# Patient Record
Sex: Male | Born: 1950
Health system: Southern US, Community
[De-identification: ages and names within clinical notes are randomized; demographics above are authoritative.]

## PROBLEM LIST (undated history)

## (undated) DIAGNOSIS — Z973 Presence of spectacles and contact lenses: Secondary | ICD-10-CM

## (undated) DIAGNOSIS — R200 Anesthesia of skin: Secondary | ICD-10-CM

## (undated) DIAGNOSIS — F1120 Opioid dependence, uncomplicated: Secondary | ICD-10-CM

## (undated) DIAGNOSIS — R319 Hematuria, unspecified: Secondary | ICD-10-CM

## (undated) DIAGNOSIS — I739 Peripheral vascular disease, unspecified: Secondary | ICD-10-CM

## (undated) DIAGNOSIS — Z8669 Personal history of other diseases of the nervous system and sense organs: Secondary | ICD-10-CM

## (undated) DIAGNOSIS — K08109 Complete loss of teeth, unspecified cause, unspecified class: Secondary | ICD-10-CM

## (undated) DIAGNOSIS — I714 Abdominal aortic aneurysm, without rupture, unspecified: Secondary | ICD-10-CM

## (undated) DIAGNOSIS — J439 Emphysema, unspecified: Secondary | ICD-10-CM

## (undated) DIAGNOSIS — Z87820 Personal history of traumatic brain injury: Secondary | ICD-10-CM

## (undated) DIAGNOSIS — I482 Chronic atrial fibrillation, unspecified: Secondary | ICD-10-CM

## (undated) DIAGNOSIS — T8859XA Other complications of anesthesia, initial encounter: Secondary | ICD-10-CM

## (undated) DIAGNOSIS — E785 Hyperlipidemia, unspecified: Secondary | ICD-10-CM

## (undated) DIAGNOSIS — R0609 Other forms of dyspnea: Secondary | ICD-10-CM

## (undated) DIAGNOSIS — K219 Gastro-esophageal reflux disease without esophagitis: Secondary | ICD-10-CM

## (undated) DIAGNOSIS — D692 Other nonthrombocytopenic purpura: Secondary | ICD-10-CM

## (undated) DIAGNOSIS — N201 Calculus of ureter: Secondary | ICD-10-CM

## (undated) DIAGNOSIS — M47819 Spondylosis without myelopathy or radiculopathy, site unspecified: Secondary | ICD-10-CM

## (undated) DIAGNOSIS — R0600 Dyspnea, unspecified: Secondary | ICD-10-CM

## (undated) DIAGNOSIS — Z8673 Personal history of transient ischemic attack (TIA), and cerebral infarction without residual deficits: Secondary | ICD-10-CM

## (undated) DIAGNOSIS — I10 Essential (primary) hypertension: Secondary | ICD-10-CM

## (undated) DIAGNOSIS — N401 Enlarged prostate with lower urinary tract symptoms: Secondary | ICD-10-CM

## (undated) DIAGNOSIS — F329 Major depressive disorder, single episode, unspecified: Secondary | ICD-10-CM

## (undated) DIAGNOSIS — R911 Solitary pulmonary nodule: Secondary | ICD-10-CM

## (undated) DIAGNOSIS — Z7901 Long term (current) use of anticoagulants: Secondary | ICD-10-CM

## (undated) DIAGNOSIS — Z972 Presence of dental prosthetic device (complete) (partial): Secondary | ICD-10-CM

## (undated) DIAGNOSIS — T4145XA Adverse effect of unspecified anesthetic, initial encounter: Secondary | ICD-10-CM

## (undated) DIAGNOSIS — R29898 Other symptoms and signs involving the musculoskeletal system: Secondary | ICD-10-CM

## (undated) HISTORY — DX: Opioid dependence, uncomplicated: F11.20

## (undated) HISTORY — DX: Hyperlipidemia, unspecified: E78.5

## (undated) HISTORY — DX: Peripheral vascular disease, unspecified: I73.9

## (undated) HISTORY — PX: OTHER SURGICAL HISTORY: SHX169

## (undated) HISTORY — DX: Solitary pulmonary nodule: R91.1

## (undated) HISTORY — DX: Essential (primary) hypertension: I10

## (undated) HISTORY — DX: Other nonthrombocytopenic purpura: D69.2

---

## 2007-05-25 ENCOUNTER — Inpatient Hospital Stay (HOSPITAL_COMMUNITY): Admission: EM | Admit: 2007-05-25 | Discharge: 2007-05-29 | Payer: Self-pay | Admitting: Emergency Medicine

## 2007-05-25 ENCOUNTER — Ambulatory Visit: Payer: Self-pay | Admitting: Cardiology

## 2007-05-26 ENCOUNTER — Encounter: Payer: Self-pay | Admitting: Cardiology

## 2007-05-26 ENCOUNTER — Encounter (INDEPENDENT_AMBULATORY_CARE_PROVIDER_SITE_OTHER): Payer: Self-pay | Admitting: Neurology

## 2007-05-30 ENCOUNTER — Ambulatory Visit: Payer: Self-pay | Admitting: Cardiology

## 2007-06-02 ENCOUNTER — Ambulatory Visit: Payer: Self-pay

## 2007-06-02 ENCOUNTER — Ambulatory Visit: Payer: Self-pay | Admitting: Cardiology

## 2007-06-09 ENCOUNTER — Ambulatory Visit: Payer: Self-pay | Admitting: Cardiology

## 2007-06-09 LAB — CONVERTED CEMR LAB
INR: 5.3 — ABNORMAL HIGH (ref 0.8–1.0)
Prothrombin Time: 52.1 s — ABNORMAL HIGH (ref 10.9–13.3)

## 2007-06-16 ENCOUNTER — Ambulatory Visit: Payer: Self-pay | Admitting: Cardiology

## 2007-06-21 ENCOUNTER — Ambulatory Visit: Payer: Self-pay | Admitting: Cardiology

## 2007-06-21 LAB — CONVERTED CEMR LAB
ALT: 33 units/L (ref 0–53)
Bilirubin, Direct: 0.2 mg/dL (ref 0.0–0.3)
CO2: 32 meq/L (ref 19–32)
Calcium: 9.4 mg/dL (ref 8.4–10.5)
Cholesterol: 144 mg/dL (ref 0–200)
Creatinine, Ser: 1.1 mg/dL (ref 0.4–1.5)
Glucose, Bld: 94 mg/dL (ref 70–99)
HDL: 25.2 mg/dL — ABNORMAL LOW (ref >39.0)
Sodium: 142 meq/L (ref 135–145)
Total Protein: 7.1 g/dL (ref 6.0–8.3)
Triglycerides: 75 mg/dL (ref 0–149)

## 2007-07-05 ENCOUNTER — Ambulatory Visit: Payer: Self-pay | Admitting: Cardiology

## 2007-07-19 ENCOUNTER — Ambulatory Visit: Payer: Self-pay | Admitting: Cardiology

## 2007-08-09 ENCOUNTER — Ambulatory Visit: Payer: Self-pay | Admitting: Internal Medicine

## 2007-08-18 ENCOUNTER — Ambulatory Visit: Payer: Self-pay | Admitting: Cardiovascular Disease

## 2007-09-15 ENCOUNTER — Ambulatory Visit: Payer: Self-pay | Admitting: Cardiology

## 2007-10-17 ENCOUNTER — Ambulatory Visit: Payer: Self-pay | Admitting: Cardiology

## 2007-11-14 ENCOUNTER — Ambulatory Visit: Payer: Self-pay | Admitting: Cardiovascular Disease

## 2007-11-27 ENCOUNTER — Emergency Department (HOSPITAL_COMMUNITY): Admission: EM | Admit: 2007-11-27 | Discharge: 2007-11-27 | Payer: Self-pay | Admitting: Emergency Medicine

## 2007-12-11 ENCOUNTER — Ambulatory Visit: Payer: Self-pay | Admitting: Cardiovascular Disease

## 2007-12-11 ENCOUNTER — Ambulatory Visit: Payer: Self-pay | Admitting: Cardiology

## 2007-12-18 ENCOUNTER — Ambulatory Visit: Payer: Self-pay | Admitting: Cardiovascular Disease

## 2007-12-25 ENCOUNTER — Ambulatory Visit: Payer: Self-pay | Admitting: Cardiology

## 2008-01-01 ENCOUNTER — Ambulatory Visit: Payer: Self-pay | Admitting: Internal Medicine

## 2008-01-22 ENCOUNTER — Ambulatory Visit: Payer: Self-pay | Admitting: Cardiology

## 2008-01-29 ENCOUNTER — Ambulatory Visit: Payer: Self-pay | Admitting: Cardiology

## 2008-01-29 LAB — CONVERTED CEMR LAB
BUN: 14 mg/dL (ref 6–23)
Creatinine, Ser: 1.1 mg/dL (ref 0.4–1.5)
GFR calc Af Amer: 88 mL/min
GFR calc non Af Amer: 73 mL/min
Potassium: 3.9 meq/L (ref 3.5–5.1)

## 2008-02-07 ENCOUNTER — Ambulatory Visit: Payer: Self-pay | Admitting: Cardiology

## 2008-02-14 ENCOUNTER — Ambulatory Visit: Payer: Self-pay | Admitting: Cardiovascular Disease

## 2008-02-28 ENCOUNTER — Ambulatory Visit: Payer: Self-pay | Admitting: Internal Medicine

## 2008-03-08 ENCOUNTER — Ambulatory Visit: Payer: Self-pay | Admitting: Cardiovascular Disease

## 2008-03-29 ENCOUNTER — Ambulatory Visit: Payer: Self-pay | Admitting: Cardiovascular Disease

## 2008-04-19 ENCOUNTER — Ambulatory Visit: Payer: Self-pay | Admitting: Cardiovascular Disease

## 2008-04-22 ENCOUNTER — Ambulatory Visit: Payer: Self-pay | Admitting: Cardiology

## 2008-04-22 ENCOUNTER — Encounter: Payer: Self-pay | Admitting: Cardiology

## 2008-04-22 DIAGNOSIS — I10 Essential (primary) hypertension: Secondary | ICD-10-CM

## 2008-04-22 DIAGNOSIS — E7800 Pure hypercholesterolemia, unspecified: Secondary | ICD-10-CM

## 2008-04-22 DIAGNOSIS — J449 Chronic obstructive pulmonary disease, unspecified: Secondary | ICD-10-CM

## 2008-04-22 DIAGNOSIS — I4891 Unspecified atrial fibrillation: Secondary | ICD-10-CM

## 2008-04-22 DIAGNOSIS — R0602 Shortness of breath: Secondary | ICD-10-CM

## 2008-04-22 DIAGNOSIS — F17200 Nicotine dependence, unspecified, uncomplicated: Secondary | ICD-10-CM

## 2008-05-03 ENCOUNTER — Ambulatory Visit: Payer: Self-pay | Admitting: Cardiology

## 2008-07-09 ENCOUNTER — Encounter: Payer: Self-pay | Admitting: *Deleted

## 2008-08-14 ENCOUNTER — Encounter: Payer: Self-pay | Admitting: *Deleted

## 2008-08-28 ENCOUNTER — Telehealth: Payer: Self-pay | Admitting: Cardiology

## 2008-09-18 ENCOUNTER — Encounter: Payer: Self-pay | Admitting: Cardiology

## 2008-09-19 ENCOUNTER — Encounter (INDEPENDENT_AMBULATORY_CARE_PROVIDER_SITE_OTHER): Payer: Self-pay | Admitting: Cardiology

## 2008-10-25 ENCOUNTER — Encounter (INDEPENDENT_AMBULATORY_CARE_PROVIDER_SITE_OTHER): Payer: Self-pay | Admitting: *Deleted

## 2008-11-08 ENCOUNTER — Encounter (INDEPENDENT_AMBULATORY_CARE_PROVIDER_SITE_OTHER): Payer: Self-pay | Admitting: Pharmacist

## 2008-12-24 ENCOUNTER — Encounter (INDEPENDENT_AMBULATORY_CARE_PROVIDER_SITE_OTHER): Payer: Self-pay | Admitting: Pharmacist

## 2009-02-28 ENCOUNTER — Encounter: Payer: Self-pay | Admitting: Cardiology

## 2009-03-03 ENCOUNTER — Telehealth (INDEPENDENT_AMBULATORY_CARE_PROVIDER_SITE_OTHER): Payer: Self-pay | Admitting: *Deleted

## 2009-03-10 ENCOUNTER — Telehealth (INDEPENDENT_AMBULATORY_CARE_PROVIDER_SITE_OTHER): Payer: Self-pay | Admitting: *Deleted

## 2009-05-04 ENCOUNTER — Emergency Department (HOSPITAL_COMMUNITY): Admission: EM | Admit: 2009-05-04 | Discharge: 2009-05-04 | Payer: Self-pay | Admitting: Emergency Medicine

## 2009-05-04 ENCOUNTER — Encounter: Payer: Self-pay | Admitting: Cardiology

## 2009-05-28 ENCOUNTER — Telehealth (INDEPENDENT_AMBULATORY_CARE_PROVIDER_SITE_OTHER): Payer: Self-pay | Admitting: *Deleted

## 2009-06-26 ENCOUNTER — Ambulatory Visit: Payer: Self-pay | Admitting: Cardiology

## 2009-06-27 ENCOUNTER — Telehealth (INDEPENDENT_AMBULATORY_CARE_PROVIDER_SITE_OTHER): Payer: Self-pay | Admitting: *Deleted

## 2009-07-01 ENCOUNTER — Ambulatory Visit: Payer: Self-pay | Admitting: Internal Medicine

## 2009-07-02 LAB — CONVERTED CEMR LAB
BUN: 9 mg/dL (ref 6–23)
Calcium: 10.1 mg/dL (ref 8.4–10.5)
Cholesterol: 255 mg/dL — ABNORMAL HIGH (ref 0–200)
Creatinine, Ser: 1.1 mg/dL (ref 0.4–1.5)
Eosinophils Relative: 1.6 % (ref 0.0–5.0)
GFR calc non Af Amer: 74.99 mL/min (ref >60)
Lymphocytes Relative: 41.7 % (ref 12.0–46.0)
Monocytes Relative: 5.9 % (ref 3.0–12.0)
Neutrophils Relative %: 50.3 % (ref 43.0–77.0)
Platelets: 356 10*3/uL (ref 150.0–400.0)
Potassium: 4.4 meq/L (ref 3.5–5.1)
Total CHOL/HDL Ratio: 6
Triglycerides: 202 mg/dL — ABNORMAL HIGH (ref 0.0–149.0)
VLDL: 40.4 mg/dL — ABNORMAL HIGH (ref 0.0–40.0)
WBC: 10.6 10*3/uL — ABNORMAL HIGH (ref 4.5–10.5)

## 2009-07-08 ENCOUNTER — Ambulatory Visit: Payer: Self-pay | Admitting: Internal Medicine

## 2009-07-16 ENCOUNTER — Ambulatory Visit: Payer: Self-pay | Admitting: Cardiology

## 2009-07-22 ENCOUNTER — Ambulatory Visit: Payer: Self-pay | Admitting: Cardiology

## 2009-07-22 ENCOUNTER — Ambulatory Visit: Payer: Self-pay

## 2009-07-22 ENCOUNTER — Encounter: Payer: Self-pay | Admitting: Cardiology

## 2009-07-22 ENCOUNTER — Ambulatory Visit (HOSPITAL_COMMUNITY)
Admission: RE | Admit: 2009-07-22 | Discharge: 2009-07-22 | Payer: Self-pay | Source: Home / Self Care | Admitting: Cardiology

## 2009-07-22 ENCOUNTER — Ambulatory Visit: Payer: Self-pay | Admitting: Cardiovascular Disease

## 2009-07-22 ENCOUNTER — Ambulatory Visit: Payer: Self-pay | Admitting: Internal Medicine

## 2009-07-22 LAB — CONVERTED CEMR LAB
ALT: 19 units/L (ref 0–53)
Alkaline Phosphatase: 69 units/L (ref 39–117)
Bilirubin, Direct: 0.1 mg/dL (ref 0.0–0.3)
Direct LDL: 117.5 mg/dL
HDL: 38.2 mg/dL — ABNORMAL LOW (ref >39.00)
Total Bilirubin: 0.4 mg/dL (ref 0.3–1.2)
Total CHOL/HDL Ratio: 5
Total Protein: 7 g/dL (ref 6.0–8.3)

## 2009-08-01 ENCOUNTER — Telehealth: Payer: Self-pay | Admitting: Cardiology

## 2009-08-12 ENCOUNTER — Ambulatory Visit: Payer: Self-pay | Admitting: Internal Medicine

## 2009-08-12 LAB — CONVERTED CEMR LAB: POC INR: 3.6

## 2009-08-28 ENCOUNTER — Ambulatory Visit: Payer: Self-pay | Admitting: Cardiology

## 2009-09-11 ENCOUNTER — Ambulatory Visit: Payer: Self-pay | Admitting: Cardiovascular Disease

## 2009-09-11 LAB — CONVERTED CEMR LAB: POC INR: 2.7

## 2009-10-02 ENCOUNTER — Ambulatory Visit: Payer: Self-pay | Admitting: Internal Medicine

## 2009-10-02 LAB — CONVERTED CEMR LAB: POC INR: 4

## 2009-10-23 ENCOUNTER — Ambulatory Visit: Payer: Self-pay | Admitting: Cardiology

## 2009-11-14 ENCOUNTER — Ambulatory Visit: Payer: Self-pay | Admitting: Cardiology

## 2009-11-25 ENCOUNTER — Ambulatory Visit: Payer: Self-pay | Admitting: Cardiology

## 2009-11-26 ENCOUNTER — Ambulatory Visit: Payer: Self-pay | Admitting: Internal Medicine

## 2009-11-26 ENCOUNTER — Encounter: Payer: Self-pay | Admitting: Internal Medicine

## 2009-11-26 DIAGNOSIS — M159 Polyosteoarthritis, unspecified: Secondary | ICD-10-CM

## 2009-11-28 LAB — CONVERTED CEMR LAB
Alkaline Phosphatase: 73 units/L (ref 39–117)
BUN: 14 mg/dL (ref 6–23)
Basophils Absolute: 0 10*3/uL (ref 0.0–0.1)
Bilirubin, Direct: 0.1 mg/dL (ref 0.0–0.3)
CO2: 32 meq/L (ref 19–32)
GFR calc non Af Amer: 72.53 mL/min (ref >60)
Glucose, Bld: 94 mg/dL (ref 70–99)
HCT: 42.3 % (ref 39.0–52.0)
HDL: 40.2 mg/dL (ref >39.00)
Hemoglobin: 14.7 g/dL (ref 13.0–17.0)
Lymphocytes Relative: 35.8 % (ref 12.0–46.0)
MCHC: 34.7 g/dL (ref 30.0–36.0)
MCV: 93.8 fL (ref 78.0–100.0)
Monocytes Relative: 5 % (ref 3.0–12.0)
Neutrophils Relative %: 58 % (ref 43.0–77.0)
Platelets: 363 10*3/uL (ref 150.0–400.0)
Potassium: 4.2 meq/L (ref 3.5–5.1)
RBC: 4.52 M/uL (ref 4.22–5.81)
Total Bilirubin: 0.5 mg/dL (ref 0.3–1.2)
Total CHOL/HDL Ratio: 5
Total CK: 164 units/L (ref 7–232)
Total Protein: 7.1 g/dL (ref 6.0–8.3)
VLDL: 21.8 mg/dL (ref 0.0–40.0)
WBC: 9.4 10*3/uL (ref 4.5–10.5)

## 2009-12-09 ENCOUNTER — Ambulatory Visit: Payer: Self-pay | Admitting: Internal Medicine

## 2009-12-09 DIAGNOSIS — M25519 Pain in unspecified shoulder: Secondary | ICD-10-CM

## 2009-12-12 ENCOUNTER — Ambulatory Visit: Payer: Self-pay | Admitting: Cardiology

## 2009-12-12 LAB — CONVERTED CEMR LAB: POC INR: 2.2

## 2009-12-20 ENCOUNTER — Emergency Department (HOSPITAL_COMMUNITY)
Admission: EM | Admit: 2009-12-20 | Discharge: 2009-12-20 | Payer: Self-pay | Source: Home / Self Care | Admitting: Emergency Medicine

## 2009-12-21 ENCOUNTER — Emergency Department (HOSPITAL_COMMUNITY): Admission: EM | Admit: 2009-12-21 | Discharge: 2009-12-21 | Payer: Self-pay | Admitting: Emergency Medicine

## 2009-12-22 ENCOUNTER — Telehealth: Payer: Self-pay | Admitting: Internal Medicine

## 2009-12-25 ENCOUNTER — Telehealth: Payer: Self-pay | Admitting: Internal Medicine

## 2010-01-09 ENCOUNTER — Ambulatory Visit: Payer: Self-pay | Admitting: Cardiovascular Disease

## 2010-01-09 LAB — CONVERTED CEMR LAB: POC INR: 2.3

## 2010-03-01 ENCOUNTER — Encounter: Payer: Self-pay | Admitting: Cardiology

## 2010-03-10 ENCOUNTER — Inpatient Hospital Stay (HOSPITAL_COMMUNITY)
Admission: EM | Admit: 2010-03-10 | Discharge: 2010-03-16 | DRG: 310 | Disposition: A | Discharge status: Home / Self Care | Payer: Self-pay | Attending: Cardiology | Admitting: Cardiology

## 2010-03-10 DIAGNOSIS — Z8673 Personal history of transient ischemic attack (TIA), and cerebral infarction without residual deficits: Secondary | ICD-10-CM

## 2010-03-10 DIAGNOSIS — I10 Essential (primary) hypertension: Secondary | ICD-10-CM | POA: Diagnosis present

## 2010-03-10 DIAGNOSIS — J4489 Other specified chronic obstructive pulmonary disease: Secondary | ICD-10-CM | POA: Diagnosis present

## 2010-03-10 DIAGNOSIS — Z9119 Patient's noncompliance with other medical treatment and regimen: Secondary | ICD-10-CM

## 2010-03-10 DIAGNOSIS — J449 Chronic obstructive pulmonary disease, unspecified: Secondary | ICD-10-CM | POA: Diagnosis present

## 2010-03-10 DIAGNOSIS — Z91199 Patient's noncompliance with other medical treatment and regimen due to unspecified reason: Secondary | ICD-10-CM

## 2010-03-10 DIAGNOSIS — I4891 Unspecified atrial fibrillation: Principal | ICD-10-CM | POA: Diagnosis present

## 2010-03-10 DIAGNOSIS — E785 Hyperlipidemia, unspecified: Secondary | ICD-10-CM | POA: Diagnosis present

## 2010-03-10 NOTE — Assessment & Plan Note (Signed)
Summary: bilateral shoulder pain/cd   Vital Signs:  Patient profile:   60 year old male Height:      71 inches Weight:      203 pounds O2 Sat:      97 % on Room air Temp:     98.1 degrees F oral Pulse rate:   76 / minute Pulse rhythm:   irregular Resp:     16 per minute BP sitting:   110 / 70  (left arm)  O2 Flow:  Room air  Primary Care Provider:  Etta Grandchild MD   History of Present Illness: He returns c/o shoulder pain after he was last here and received vaccines in his shoulders: right- Tdap and left-Flu. The right shoulder has hurt the most and still hurts but the left shoulder pain has resolved. He did not see any redness or swelling at the injection sight. Tramadol controls the pain. He has had pains like this before and he does not think it is caused by his statin therapy and he recently had a normal CPK level done. He thinks it is DJD pain like that he has in his left ankle.  Hypertension History:      He denies headache, chest pain, palpitations, dyspnea with exertion, orthopnea, PND, peripheral edema, visual symptoms, neurologic problems, syncope, and side effects from treatment.        Positive major cardiovascular risk factors include male age 61 years old or older, hyperlipidemia, hypertension, and current tobacco user.  Negative major cardiovascular risk factors include no history of diabetes.        Positive history for target organ damage include cardiac end organ damage (either CHF or LVH) and prior stroke (or TIA).  Further assessment for target organ damage reveals no history of ASHD, peripheral vascular disease, renal insufficiency, or hypertensive retinopathy.     Allergies: 1)  ! Aspirin  Past History:  Past Medical History: Last updated: 11/14/2009 Atrial fibrillation hypertension. Hyperlipidemia  History of amaurosis fugax in the left eye   Past Surgical History: Last updated: 11/26/2009 Denies surgical history  Family History: Last updated:  04/18/2008 Mother is alive at age 51 with hypertension.  Father   died of 3 with gastric cancer.  He has a sister who is alive and well.      Social History: Last updated: 11/26/2009 He lives in [place] with his mother.  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  Alcohol Use - no Drug use-no Regular exercise-no  Risk Factors: Alcohol Use: 0 (11/26/2009) Exercise: no (11/26/2009)  Family History: Reviewed history from 04/18/2008 and no changes required. Mother is alive at age 79 with hypertension.  Father   died of 64 with gastric cancer.  He has a sister who is alive and well.      Social History: Reviewed history from 11/26/2009 and no changes required. He lives in [place] with his mother.  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  Alcohol Use - no Drug use-no Regular exercise-no  Review of Systems  The patient denies chest pain, syncope, peripheral edema, prolonged cough, abdominal pain, suspicious skin lesions, and enlarged lymph nodes.   MS:  Complains of joint pain and stiffness; denies joint redness, joint swelling, loss of strength, low back pain, muscle aches, cramps, muscle weakness, and thoracic pain.  Physical Exam  General:  alert, well-developed, well-nourished, well-hydrated, and unkempt.   Neck:  supple, full ROM, and no masses.  Lungs:  normal respiratory effort, no intercostal retractions, no accessory muscle use, normal breath sounds, no dullness, no fremitus, no crackles, and no wheezes.   Heart:  normal rate, regular rhythm, no murmur, no gallop, no rub, and no JVD.   Abdomen:  soft, non-tender, normal bowel sounds, no distention, no masses, no guarding, no rigidity, no rebound tenderness, no abdominal hernia, no inguinal hernia, no hepatomegaly, and no splenomegaly.   Msk:  normal ROM, no joint tenderness, no joint swelling, no joint warmth, no redness over joints, no joint deformities, no joint instability, no  crepitation, and no muscle atrophy.   Pulses:  R and L carotid,radial,femoral,dorsalis pedis and posterior tibial pulses are full and equal bilaterally Extremities:  No clubbing, cyanosis, edema, or deformity noted with normal full range of motion of all joints.   Neurologic:  No cranial nerve deficits noted. Station and gait are normal. Plantar reflexes are down-going bilaterally. DTRs are symmetrical throughout. Sensory, motor and coordinative functions appear intact. Skin:  turgor normal, color normal, no rashes, no suspicious lesions, no ecchymoses, no petechiae, no purpura, no ulcerations, and no edema.   Cervical Nodes:  no anterior cervical adenopathy and no posterior cervical adenopathy.   Axillary Nodes:  no R axillary adenopathy and no L axillary adenopathy.   Inguinal Nodes:  no R inguinal adenopathy and no L inguinal adenopathy.   Psych:  Cognition and judgment appear intact. Alert and cooperative with normal attention span and concentration. No apparent delusions, illusions, hallucinations   Impression & Recommendations:  Problem # 1:  SHOULDER PAIN, RIGHT (ICD-719.41) Assessment New  will look for FB, DJD, etc. His updated medication list for this problem includes:    Tramadol Hcl 50 Mg Tabs (Tramadol hcl) .Marland Kitchen... 1-2 by mouth qid as needed for pain  Orders: T-Shoulder Right (73030TC)  Discussed shoulder exercises, use of moist heat or ice, and medication.   Problem # 2:  DEGENERATIVE JOINT DISEASE, GENERALIZED (ICD-715.09) Assessment: Improved  His updated medication list for this problem includes:    Tramadol Hcl 50 Mg Tabs (Tramadol hcl) .Marland Kitchen... 1-2 by mouth qid as needed for pain  Complete Medication List: 1)  Metoprolol Tartrate 50 Mg Tabs (Metoprolol tartrate) .... Take one tablet by mouth twice a day 2)  Lisinopril 20 Mg Tabs (Lisinopril) .... Take one tablet by mouth daily 3)  Furosemide 40 Mg Tabs (Furosemide) .... Take one tablet by mouth daily. 4)  Amlodipine  Besylate 10 Mg Tabs (Amlodipine besylate) .... Take one tablet by mouth daily 5)  Warfarin Sodium 5 Mg Tabs (Warfarin sodium) .... Use as directed by anticoagulation clinic 6)  Pravastatin Sodium 40 Mg Tabs (Pravastatin sodium) .... Take one tablet by mouth daily at bedtime 7)  Tramadol Hcl 50 Mg Tabs (Tramadol hcl) .Marland Kitchen.. 1-2 by mouth qid as needed for pain  Hypertension Assessment/Plan:      The patient's hypertensive risk group is category C: Target organ damage and/or diabetes.  His calculated 10 year risk of coronary heart disease is 22 %.  Today's blood pressure is 110/70.  His blood pressure goal is < 140/90.  Patient Instructions: 1)  Please schedule a follow-up appointment in 1 month. 2)  Take 650-1000mg  of Tylenol every 4-6 hours as needed for relief of pain or comfort of fever AVOID taking more than 4000mg   in a 24 hour period (can cause liver damage in higher doses). 3)  You may move around but avoid painful motions. Apply ice to sore area for 20 minutes  3-4 times a day for 2-3 days.   Orders Added: 1)  T-Shoulder Right [73030TC] 2)  Est. Patient Level III [[address]]

## 2010-03-10 NOTE — Medication Information (Signed)
Summary: rov/eac  Anticoagulant Therapy  Managed by: Weston Brass, PharmD Referring MD: Olga Millers MD Supervising MD: Juanda Chance MD, Cleota Pellerito Indication 1: Atrial Fibrillation (ICD-427.31) Indication 2: Transcient Ischemic Attack Lab Used: [facility] Site: [name] INR POC 3.0 INR RANGE 2-3  Dietary changes: no    Health status changes: yes       Details: having some tingling/numbness in hands; suggested he make appt with PCP   Recent/future hospitalizations: no    Any changes in medication regimen? no    Recent/future dental: no  Any missed doses?: no       Is patient compliant with meds? yes       Anticoagulation Management History:      The patient is taking warfarin and comes in today for a routine follow up visit.  Negative risk factors for bleeding include an age less than 31 years old.  The bleeding index is 'low risk'.  Positive CHADS2 values include History of HTN.  Negative CHADS2 values include Age > 49 years old.  The start date was 05/23/2007.  His last INR was 5.3 RATIO.  Anticoagulation responsible provider: Juanda Chance MD, Smitty Cords.  INR POC: 3.0.  Cuvette Lot#: [id number].  Exp: 09/2010.    Anticoagulation Management Assessment/Plan:      The patient's current anticoagulation dose is Warfarin sodium 5 mg tabs: Use as directed by Anticoagulation Clinic.  The target INR is 2.0-3.0.  The next INR is due 07/23/2009.  Anticoagulation instructions were given to patient.  Results were reviewed/authorized by Weston Brass, PharmD.  He was notified by Weston Brass PharmD.         Prior Anticoagulation Instructions: INR 3.0  Start NEW dosing schedule of 1 tablet on Monday and Friday and 1.5 tablets all other days.  Return to clinic in 1 week.    Current Anticoagulation Instructions: INR 3.0  Continue same dose of 1 1/2 tablets every day except 1 tablet on Monday and Friday

## 2010-03-10 NOTE — Medication Information (Signed)
Summary: rov/sp  Anticoagulant Therapy  Managed by: Eda Keys, PharmD Referring MD: Olga Millers MD Supervising MD: Ladona Ridgel MD, Sharlot Gowda Indication 1: Atrial Fibrillation (ICD-427.31) Indication 2: Transcient Ischemic Attack Lab Used: [facility] Site: [name] INR POC 3.0 INR RANGE 2-3  Dietary changes: no    Health status changes: no    Bleeding/hemorrhagic complications: no    Recent/future hospitalizations: no    Any changes in medication regimen? no    Recent/future dental: no  Any missed doses?: no       Is patient compliant with meds? yes       Anticoagulation Management History:      The patient is taking warfarin and comes in today for a routine follow up visit.  Negative risk factors for bleeding include an age less than 53 years old.  The bleeding index is 'low risk'.  Positive CHADS2 values include History of HTN.  Negative CHADS2 values include Age > 32 years old.  The start date was 05/23/2007.  His last INR was 5.3 RATIO.  Anticoagulation responsible provider: Ladona Ridgel MD, Sharlot Gowda.  INR POC: 3.0.  Cuvette Lot#: [id number].  Exp: 09/2010.    Anticoagulation Management Assessment/Plan:      The patient's current anticoagulation dose is Warfarin sodium 5 mg tabs: Use as directed by Anticoagulation Clinic.  The target INR is 2.0-3.0.  The next INR is due 07/16/2009.  Anticoagulation instructions were given to patient.  Results were reviewed/authorized by Eda Keys, PharmD.  He was notified by Eda Keys.         Prior Anticoagulation Instructions: INR 1.8  Continue same dose of 1 1/2 tablets every day.   Current Anticoagulation Instructions: INR 3.0  Start NEW dosing schedule of 1 tablet on Monday and Friday and 1.5 tablets all other days.  Return to clinic in 1 week.

## 2010-03-10 NOTE — Assessment & Plan Note (Signed)
Summary: rov/dm  Medications Added WARFARIN SODIUM 5 MG TABS (WARFARIN SODIUM) Use as directed by Anticoagulation Clinic        CC:  sob.  History of Present Illness: Pleasant male with past medical history of atrial fibrillation and prior TIA associated with this. In 2009 his LV function was normal and a previous nuclear study showed no scar or ischemia. Since I last saw him in March 2010  he does notice increased dyspnea on exertion. There is no orthopnea, PND, pedal edema, exertional chest pain. He occasionally has mild palpitations but no syncope. He was seen in the emergency room in March for shortness of breath. His chest x-ray showed mild interstitial edema and his BNP was mildly elevated. Lasix was added to his medical regimen. He does notice some dizziness with standing.  Current Medications (verified): 1)  Metoprolol Tartrate 50 Mg Tabs (Metoprolol Tartrate) .... Take One Tablet By Mouth Twice A Day 2)  Lisinopril 5 Mg Tabs (Lisinopril) .... Take One Tablet By Mouth Daily 3)  Furosemide 40 Mg Tabs (Furosemide) .... Take One Tablet By Mouth Daily. 4)  Amlodipine Besylate 10 Mg Tabs (Amlodipine Besylate) .... Take One Tablet By Mouth Daily  Past History:  Past Medical History: Atrial fibrillation hypertension. Hyperlipidemia  History of amaurosis fugax in the left eye  tobacco abuse  Social History: Reviewed history from 04/18/2008 and no changes required. He lives in [place] with his mother.  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  Alcohol Use - no  Review of Systems       no fevers or chills, productive cough, hemoptysis, dysphasia, odynophagia, melena, hematochezia, dysuria, hematuria, rash, seizure activity, orthopnea, PND, pedal edema, claudication. Remaining systems are negative.   Vital Signs:  Patient profile:   60 year old male Height:      71 inches Weight:      206 pounds BMI:     28.84 Pulse rate:   83 / minute Resp:     14 per  minute BP sitting:   118 / 90  (left arm)  Vitals Entered By: Kem Parkinson (Jun 26, 2009 11:44 AM)  Physical Exam  General:  Well-developed well-nourished in no acute distress.  Skin is warm and dry.  HEENT is normal.  Neck is supple. No thyromegaly.  Chest is clear to auscultation with normal expansion.  Cardiovascular exam is irregular Abdominal exam nontender or distended. No masses palpated. Extremities show no edema. neuro grossly intact    EKG  Procedure date:  06/26/2009  Findings:      atrial fibrillation at a rate of 83. Occasional PVCs or aberrantly conducted beats. Nonspecific ST changes.  Impression & Recommendations:  Problem # 1:  TOBACCO ABUSE (ICD-305.1) Patient counseled on discontinuing.  Problem # 2:  DYSPNEA (ICD-786.05)  Probable contribution from pulmonary disease but possibly CHF as well. Continue Lasix but check bmet. Repeat echocardiogram. The following medications were removed from the medication list:    Aspirin 81 Mg Tabs (Aspirin) .Marland Kitchen... 1 tab by mouth once daily His updated medication list for this problem includes:    Metoprolol Tartrate 50 Mg Tabs (Metoprolol tartrate) .Marland Kitchen... Take one tablet by mouth twice a day    Lisinopril 5 Mg Tabs (Lisinopril) .Marland Kitchen... Take one tablet by mouth daily    Furosemide 40 Mg Tabs (Furosemide) .Marland Kitchen... Take one tablet by mouth daily.    Amlodipine Besylate 10 Mg Tabs (Amlodipine besylate) .Marland Kitchen... Take one tablet by mouth daily  Orders: TLB-CBC Platelet - w/Differential (85025-CBCD)  Problem # 3:  COPD (ICD-496)  Problem # 4:  HYPERCHOLESTEROLEMIA-PURE (ICD-272.0)  He discontinued this secondary to financial issues. The following medications were removed from the medication list:    Simvastatin 40 Mg Tabs (Simvastatin) .Marland Kitchen... 1 tab by mouth once daily  The following medications were removed from the medication list:    Simvastatin 40 Mg Tabs (Simvastatin) .Marland Kitchen... 1 tab by mouth once daily  Orders: TLB-Lipid  Panel (80061-LIPID)  Problem # 5:  HYPERTENSION, BENIGN ESSENTIAL (ICD-401.1) Blood pressure running low. He is also complaining of some dizziness with standing. Discontinue lisinopril. Await bmet. I wonder if he is mildly dehydrated. The following medications were removed from the medication list:    Aspirin 81 Mg Tabs (Aspirin) .Marland Kitchen... 1 tab by mouth once daily His updated medication list for this problem includes:    Metoprolol Tartrate 50 Mg Tabs (Metoprolol tartrate) .Marland Kitchen... Take one tablet by mouth twice a day    Lisinopril 5 Mg Tabs (Lisinopril) .Marland Kitchen... Take one tablet by mouth daily    Furosemide 40 Mg Tabs (Furosemide) .Marland Kitchen... Take one tablet by mouth daily.    Amlodipine Besylate 10 Mg Tabs (Amlodipine besylate) .Marland Kitchen... Take one tablet by mouth daily  The following medications were removed from the medication list:    Aspirin 81 Mg Tabs (Aspirin) .Marland Kitchen... 1 tab by mouth once daily His updated medication list for this problem includes:    Metoprolol Tartrate 50 Mg Tabs (Metoprolol tartrate) .Marland Kitchen... Take one tablet by mouth twice a day    Lisinopril 5 Mg Tabs (Lisinopril) .Marland Kitchen... Take one tablet by mouth daily    Furosemide 40 Mg Tabs (Furosemide) .Marland Kitchen... Take one tablet by mouth daily.    Amlodipine Besylate 10 Mg Tabs (Amlodipine besylate) .Marland Kitchen... Take one tablet by mouth daily  Orders: TLB-BMP (Basic Metabolic Panel-BMET) (80048-METABOL)  Problem # 6:  ATRIAL FIBRILLATION (ICD-427.31) Continue beta blocker for rate control. Resume Coumadin at previous dose with goal INR 2-3. He discontinued his previously secondary to financial reasons. He is agreeable to return for PT checks. He understands the risk of an embolic event. He has now been in atrial fibrillation for over 2 years. I think the likelihood of reestablishing sinus rhythm his low. Note the patient has had a previous TIA and therefore we should continue Coumadin for good. The following medications were removed from the medication list:     Aspirin 81 Mg Tabs (Aspirin) .Marland Kitchen... 1 tab by mouth once daily    Coumadin 5 Mg Tabs (Warfarin sodium) .Marland Kitchen... 1 1/2 daily His updated medication list for this problem includes:    Metoprolol Tartrate 50 Mg Tabs (Metoprolol tartrate) .Marland Kitchen... Take one tablet by mouth twice a day  Orders: Echocardiogram (Echo) Prescriptions: FUROSEMIDE 40 MG TABS (FUROSEMIDE) Take one tablet by mouth daily.  #30 x 12   Entered by:   Deliah Goody, RN   Authorized by:   Ferman Hamming, MD, [facility]   Signed by:   Deliah Goody, RN on 06/26/2009   Method used:   Electronically to        [facility] (retail)       [address]       [place], [place]  [address]       Ph: [phone]       Fax: [phone]   RxID:   [id number] WARFARIN SODIUM 5 MG TABS (WARFARIN SODIUM) Use as directed  by Anticoagulation Clinic  #45 x 12   Entered by:   Deliah Goody, RN   Authorized by:   Ferman Hamming, MD, [facility]   Signed by:   Deliah Goody, RN on 06/26/2009   Method used:   Electronically to        [facility] (retail)       [address]       [place], [place]  [address]       Ph: [phone]       Fax: [phone]   RxID:   [id number]

## 2010-03-10 NOTE — Letter (Signed)
Summary: Results Follow-up Letter  [facility]  [address] [place], [place] [address]   Phone: [phone]  Fax: [phone]    11/26/2009  [address] [place], [place]  [address]  Dear [name],   The following are the results of your recent test(s):  Test     Result     Left knee xray   normal- no arthritis Left ankle xray   no arthritis but there is an osteochondroma (benign)   _________________________________________________________  Please call for an appointment as needed _________________________________________________________ _________________________________________________________ _________________________________________________________  Sincerely,  Sanda Linger MD [facility]

## 2010-03-10 NOTE — Medication Information (Signed)
Summary: rov/mw  Anticoagulant Therapy  Managed by: Weston Brass, PharmD Referring MD: Olga Millers MD Supervising MD: Juanda Chance MD, Bruce Indication 1: Atrial Fibrillation (ICD-427.31) Indication 2: Transcient Ischemic Attack Lab Used: [facility] Site: [name] INR POC 3.0 INR RANGE 2-3  Dietary changes: no    Health status changes: no    Bleeding/hemorrhagic complications: no    Recent/future hospitalizations: no    Any changes in medication regimen? yes       Details: Diabetes medications increased dose  Recent/future dental: no  Any missed doses?: no       Is patient compliant with meds? yes       Anticoagulation Management History:      The patient is taking warfarin and comes in today for a routine follow up visit.  Negative risk factors for bleeding include an age less than 62 years old.  The bleeding index is 'low risk'.  Positive CHADS2 values include History of HTN.  Negative CHADS2 values include Age > 20 years old.  The start date was 05/23/2007.  His last INR was 5.3 RATIO.  Anticoagulation responsible provider: Juanda Chance MD, Smitty Cords.  INR POC: 3.0.  Cuvette Lot#: [id number].  Exp: 12/2010.    Anticoagulation Management Assessment/Plan:      The patient's current anticoagulation dose is Warfarin sodium 5 mg tabs: Use as directed by Anticoagulation Clinic.  The target INR is 2.0-3.0.  The next INR is due 12/12/2009.  Anticoagulation instructions were given to patient.  Results were reviewed/authorized by Weston Brass, PharmD.  He was notified by Ilean Skill D candidate.         Prior Anticoagulation Instructions: INR 2.9  No changes today. Continue same dose of 1 tablet everday except 1.5 on sunday and thursday. we'll see you in 4 weeks.   Current Anticoagulation Instructions: INR 3.0  Continue taking 1 tablet everyday except 1 1/2 tablet on Sunday and Thursday. Recheck in 4 weeks.

## 2010-03-10 NOTE — Medication Information (Signed)
Summary: rov/tm  Anticoagulant Therapy  Managed by: Bethena Midget, RN, BSN Referring MD: Olga Millers MD Supervising MD: Myrtis Ser MD, Tinnie Gens Indication 1: Atrial Fibrillation (ICD-427.31) Indication 2: Transcient Ischemic Attack Lab Used: [facility] Site: [name] INR POC 3.5 INR RANGE 2-3  Dietary changes: no    Health status changes: no    Bleeding/hemorrhagic complications: no    Recent/future hospitalizations: no    Any changes in medication regimen? no    Recent/future dental: no  Any missed doses?: no       Is patient compliant with meds? yes       Anticoagulation Management History:      The patient is taking warfarin and comes in today for a routine follow up visit.  Negative risk factors for bleeding include an age less than 58 years old.  The bleeding index is 'low risk'.  Positive CHADS2 values include History of HTN.  Negative CHADS2 values include Age > 68 years old.  The start date was 05/23/2007.  His last INR was 5.3 RATIO.  Anticoagulation responsible provider: Myrtis Ser MD, Tinnie Gens.  INR POC: 3.5.  Cuvette Lot#: [id number].  Exp: 11/2010.    Anticoagulation Management Assessment/Plan:      The patient's current anticoagulation dose is Warfarin sodium 5 mg tabs: Use as directed by Anticoagulation Clinic.  The target INR is 2.0-3.0.  The next INR is due 09/11/2009.  Anticoagulation instructions were given to patient.  Results were reviewed/authorized by Bethena Midget, RN, BSN.  He was notified by Bethena Midget, RN, BSN.         Prior Anticoagulation Instructions: INR 3.6 Skip tomorrow's dose then change dose to 1 1/2 pills everyday except 1 pill on Mondays, Wednesdays and Fridays. Recheck in 2 weeks.   Current Anticoagulation Instructions: INR 3.5 Skip today's dose then change dose to 1 pill everyday except 1.5 pills on Tuesdays, Thursdays and Sundays. Recheck in 2 weeks.

## 2010-03-10 NOTE — Medication Information (Signed)
Summary: [name]  Anticoagulant Therapy  Managed by: Cloyde Reams, RN, BSN Referring MD: Olga Millers MD Supervising MD: Graciela Husbands MD, Viviann Spare Indication 1: Atrial Fibrillation (ICD-427.31) Indication 2: Transcient Ischemic Attack Lab Used: [facility] Site: [name] INR POC 4.0 INR RANGE 2-3  Dietary changes: no    Health status changes: no    Bleeding/hemorrhagic complications: no    Recent/future hospitalizations: no    Any changes in medication regimen? no    Recent/future dental: no  Any missed doses?: no       Is patient compliant with meds? yes      Comments: Pt scheduled to see Dr Jens Som on Monday, he needs to r/s.  Pt going to checkout to try and schedul Coumadin f/u and [place] appt on same day.  Pt has no insurance and is trying to cut down on out of pocket expense.   Anticoagulation Management History:      The patient is taking warfarin and comes in today for a routine follow up visit.  Negative risk factors for bleeding include an age less than 61 years old.  The bleeding index is 'low risk'.  Positive CHADS2 values include History of HTN.  Negative CHADS2 values include Age > 61 years old.  The start date was 05/23/2007.  His last INR was 5.3 RATIO.  Anticoagulation responsible provider: Graciela Husbands MD, Viviann Spare.  INR POC: 4.0.  Cuvette Lot#: [id number].  Exp: 11/2010.    Anticoagulation Management Assessment/Plan:      The patient's current anticoagulation dose is Warfarin sodium 5 mg tabs: Use as directed by Anticoagulation Clinic.  The target INR is 2.0-3.0.  The next INR is due 10/23/2009.  Anticoagulation instructions were given to patient.  Results were reviewed/authorized by Cloyde Reams, RN, BSN.  He was notified by Cloyde Reams RN.         Prior Anticoagulation Instructions: INR-2.7  Continue taking 1 tablet (5mg ) every day except take 1.5 tablets (7.5mg ) on Sun, Tues, and Thurs.    Current Anticoagulation Instructions: INR 4.0  Skip tomorrow's dosage  of Coumadin, then start taking 1 tablet daily except 1.5 tablets on Sundays and Thursdays.  Recheck in 2-3 weeks.

## 2010-03-10 NOTE — Progress Notes (Signed)
   Pt signed ROI, copied records, called pt for pick-up  [facility]  Jun 27, 2009 1:40 PM    Appended Document:  Pt Picked up Records Today.Marland KitchenKM

## 2010-03-10 NOTE — Assessment & Plan Note (Signed)
Summary: NEW INDIGENT [facility] 100%--SHARONDA/DR CRENSHAW-#--PKG/OFF...   Vital Signs:  Patient profile:   60 year old male Height:      71 inches Weight:      202 pounds BMI:     28.28 O2 Sat:      97 % on Room air Temp:     97.4 degrees F oral Pulse rate:   74 / minute Pulse rhythm:   regular Resp:     16 per minute BP sitting:   110 / 80  (left arm) Cuff size:   regular  Vitals Entered By: Alysia Penna (November 26, 2009 2:50 PM)  Nutrition Counseling: Patient's BMI is greater than 25 and therefore counseled on weight management options.  O2 Flow:  Room air CC: new pt visit. /cp sma., Hypertension Management   Primary Care On[facility] Clair:  Etta Grandchild MD  CC:  new pt visit. /cp sma. and Hypertension Management.  History of Present Illness: New to me he comes in today with a complaint of pain in his joints, assymetrical, in the large joints (knees, ankles, elbows, and shoulders) left >> right. The pain started very gradually with no specific trauma or injury but he does a lot of repetitive activity in his line of work. He has tried Tylenol with minimal pain relief. He can't take nsaids. There is no swelling or redness. The small joints are never involved. There is no FH of inflammatory arhtritis.  Hypertension History:      He denies headache, chest pain, palpitations, dyspnea with exertion, orthopnea, PND, peripheral edema, visual symptoms, neurologic problems, syncope, and side effects from treatment.  He notes no problems with any antihypertensive medication side effects.        Positive major cardiovascular risk factors include male age 27 years old or older, hyperlipidemia, hypertension, and current tobacco user.  Negative major cardiovascular risk factors include no history of diabetes.        Positive history for target organ damage include cardiac end organ damage (either CHF or LVH) and prior stroke (or TIA).  Further assessment for target organ damage reveals no  history of ASHD, peripheral vascular disease, renal insufficiency, or hypertensive retinopathy.     Preventive Screening-Counseling & Management  Alcohol-Tobacco     Alcohol drinks/day: 0     Smoking Status: current     Smoking Cessation Counseling: yes     Smoke Cessation Stage: ready     Packs/Day: <0.25     Year Started: 1967     Pack years: 98     Tobacco Counseling: to quit use of tobacco products  Caffeine-Diet-Exercise     Does Patient Exercise: no  Hep-HIV-STD-Contraception     Hepatitis Risk: no risk noted     HIV Risk: no risk noted     STD Risk: no risk noted      Drug Use:  no.    Clinical Review Panels:  Immunizations   Last Tetanus Booster:  Tdap (11/26/2009)   Last Flu Vaccine:  Fluvax 3+ (11/26/2009)  Lipid Management   Cholesterol:  214 (11/25/2009)   LDL (bad choesterol):  104 (06/21/2007)   HDL (good cholesterol):  40.20 (11/25/2009)  Diabetes Management   Creatinine:  1.1 (11/25/2009)   Last Flu Vaccine:  Fluvax 3+ (11/26/2009)  CBC   WBC:  9.4 (11/25/2009)   RBC:  4.52 (11/25/2009)   Hgb:  14.7 (11/25/2009)   Hct:  42.3 (11/25/2009)   Platelets:  363.0 (11/25/2009)   MCV  93.8 (  11/25/2009)   MCHC  34.7 (11/25/2009)   RDW  14.0 (11/25/2009)   PMN:  58.0 (11/25/2009)   Lymphs:  35.8 (11/25/2009)   Monos:  5.0 (11/25/2009)   Eosinophils:  0.9 (11/25/2009)   Basophil:  0.3 (11/25/2009)  Complete Metabolic Panel   Glucose:  94 (11/25/2009)   Sodium:  143 (11/25/2009)   Potassium:  4.2 (11/25/2009)   Chloride:  104 (11/25/2009)   CO2:  32 (11/25/2009)   BUN:  14 (11/25/2009)   Creatinine:  1.1 (11/25/2009)   Albumin:  4.2 (11/25/2009)   Total Protein:  7.1 (11/25/2009)   Calcium:  9.7 (11/25/2009)   Total Bili:  0.5 (11/25/2009)   Alk Phos:  73 (11/25/2009)   SGPT (ALT):  17 (11/25/2009)   SGOT (AST):  20 (11/25/2009)   Medications Prior to Update: 1)  Metoprolol Tartrate 50 Mg Tabs (Metoprolol Tartrate) .... Take One Tablet By  Mouth Twice A Day 2)  Lisinopril 20 Mg Tabs (Lisinopril) .... Take One Tablet By Mouth Daily 3)  Furosemide 40 Mg Tabs (Furosemide) .... Take One Tablet By Mouth Daily. 4)  Amlodipine Besylate 10 Mg Tabs (Amlodipine Besylate) .... Take One Tablet By Mouth Daily 5)  Warfarin Sodium 5 Mg Tabs (Warfarin Sodium) .... Use As Directed By Anticoagulation Clinic 6)  Pravastatin Sodium 40 Mg Tabs (Pravastatin Sodium) .... Take One Tablet By Mouth Daily At Bedtime  Current Medications (verified): 1)  Metoprolol Tartrate 50 Mg Tabs (Metoprolol Tartrate) .... Take One Tablet By Mouth Twice A Day 2)  Lisinopril 20 Mg Tabs (Lisinopril) .... Take One Tablet By Mouth Daily 3)  Furosemide 40 Mg Tabs (Furosemide) .... Take One Tablet By Mouth Daily. 4)  Amlodipine Besylate 10 Mg Tabs (Amlodipine Besylate) .... Take One Tablet By Mouth Daily 5)  Warfarin Sodium 5 Mg Tabs (Warfarin Sodium) .... Use As Directed By Anticoagulation Clinic 6)  Pravastatin Sodium 40 Mg Tabs (Pravastatin Sodium) .... Take One Tablet By Mouth Daily At Bedtime 7)  Tramadol Hcl 50 Mg Tabs (Tramadol Hcl) .Marland Kitchen.. 1-2 By Mouth Qid As Needed For Pain  Allergies (verified): 1)  ! Aspirin  Past History:  Family History: Last updated: 04/18/2008 Mother is alive at age 44 with hypertension.  Father   died of 13 with gastric cancer.  He has a sister who is alive and well.      Past Medical History: Reviewed history from 11/14/2009 and no changes required. Atrial fibrillation hypertension. Hyperlipidemia  History of amaurosis fugax in the left eye   Past Surgical History: Denies surgical history  Family History: Reviewed history from 04/18/2008 and no changes required. Mother is alive at age 42 with hypertension.  Father   died of 50 with gastric cancer.  He has a sister who is alive and well.      Social History: Reviewed history from 04/18/2008 and no changes required. He lives in [place] with his mother.  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  Alcohol Use - no Drug use-no Regular exercise-no Packs/Day:  <0.25 Hepatitis Risk:  no risk noted HIV Risk:  no risk noted STD Risk:  no risk noted Drug Use:  no Does Patient Exercise:  no  Review of Systems  The patient denies peripheral edema, prolonged cough, headaches, hemoptysis, abdominal pain, hematuria, and abnormal bleeding.   MS:  Complains of joint pain; denies joint redness, joint swelling, loss of strength, low back pain, muscle aches, muscle, cramps, muscle weakness, and stiffness.  Physical Exam  General:  alert, well-developed, well-nourished, well-hydrated, and unkempt.   Head:  normocephalic, atraumatic, no abnormalities observed, and no abnormalities palpated.   Neck:  supple, full ROM, and no masses.   Lungs:  normal respiratory effort, no intercostal retractions, no accessory muscle use, normal breath sounds, no dullness, no fremitus, no crackles, and no wheezes.   Heart:  normal rate, regular rhythm, no murmur, no gallop, no rub, and no JVD.   Abdomen:  soft, non-tender, normal bowel sounds, no distention, no masses, no guarding, no rigidity, no rebound tenderness, no abdominal hernia, no inguinal hernia, no hepatomegaly, and no splenomegaly.   Msk:  there are chronic degenerative changes in the ankles L>>R, and knees L>>R. all other joints are cool with FROM and no degenerative changes,no joint swelling, no joint warmth, no redness over joints, no joint instability, no crepitation, and no muscle atrophy.   Pulses:  R and L carotid,radial,femoral,dorsalis pedis and posterior tibial pulses are full and equal bilaterally Extremities:  No clubbing, cyanosis, edema, or deformity noted with normal full range of motion of all joints.   Neurologic:  No cranial nerve deficits noted. Station and gait are normal. Plantar reflexes are down-going bilaterally. DTRs are symmetrical throughout. Sensory, motor and coordinative functions appear  intact. Skin:  turgor normal, color normal, no rashes, no suspicious lesions, no ecchymoses, no petechiae, no purpura, and no edema.   Cervical Nodes:  no anterior cervical adenopathy and no posterior cervical adenopathy.   Axillary Nodes:  no R axillary adenopathy and no L axillary adenopathy.   Inguinal Nodes:  no R inguinal adenopathy and no L inguinal adenopathy.   Psych:  Cognition and judgment appear intact. Alert and cooperative with normal attention span and concentration. No apparent delusions, illusions, hallucinations   Impression & Recommendations:  Problem # 1:  DEGENERATIVE JOINT DISEASE, GENERALIZED (ICD-715.09)  His updated medication list for this problem includes:    Tramadol Hcl 50 Mg Tabs (Tramadol hcl) .Marland Kitchen... 1-2 by mouth qid as needed for pain  Problem # 2:  KNEE PAIN, LEFT (ICD-719.46)  His updated medication list for this problem includes:    Tramadol Hcl 50 Mg Tabs (Tramadol hcl) .Marland Kitchen... 1-2 by mouth qid as needed for pain  Orders: T-Knee Comp Left 4 Views (73564TC) T-Ankle Comp Left Min 3 Views (73610TC)  Problem # 3:  ANKLE PAIN, LEFT (ICD-719.47) Assessment: New  Orders: T-Knee Comp Left 4 Views (73564TC) T-Ankle Comp Left Min 3 Views (73610TC)  Complete Medication List: 1)  Metoprolol Tartrate 50 Mg Tabs (Metoprolol tartrate) .... Take one tablet by mouth twice a day 2)  Lisinopril 20 Mg Tabs (Lisinopril) .... Take one tablet by mouth daily 3)  Furosemide 40 Mg Tabs (Furosemide) .... Take one tablet by mouth daily. 4)  Amlodipine Besylate 10 Mg Tabs (Amlodipine besylate) .... Take one tablet by mouth daily 5)  Warfarin Sodium 5 Mg Tabs (Warfarin sodium) .... Use as directed by anticoagulation clinic 6)  Pravastatin Sodium 40 Mg Tabs (Pravastatin sodium) .... Take one tablet by mouth daily at bedtime 7)  Tramadol Hcl 50 Mg Tabs (Tramadol hcl) .Marland Kitchen.. 1-2 by mouth qid as needed for pain  Other Orders: Tdap => 32yrs IM (87564) Admin 1st Vaccine  (33295) Admin 1st Vaccine (18841) Flu Vaccine 12yrs + (66063)  Hypertension Assessment/Plan:      The patient's hypertensive risk group is category C: Target organ damage and/or diabetes.  His calculated 10 year risk of coronary heart disease is 22 %.  Today's blood pressure is 110/80.  His blood pressure goal is < 140/90.  Patient Instructions: 1)  Please schedule a follow-up appointment in 1 month. 2)  Tobacco is very bad for your health and your loved ones! You Should stop smoking!. 3)  Stop Smoking Tips: Choose a Quit date. Cut down before the Quit date. decide what you will do as a substitute when you feel the urge to smoke(gum,toothpick,exercise). Prescriptions: TRAMADOL HCL 50 MG TABS (TRAMADOL HCL) 1-2 by mouth QID as needed for pain  #100 x 5   Entered and Authorized by:   Etta Grandchild MD   Signed by:   Etta Grandchild MD on 11/26/2009   Method used:   Electronically to        [facility] (retail)       [address]       [place], [place]  [address]       Ph: [phone]       Fax: [phone]   RxID:   [id number]    Orders Added: 1)  Tdap => 16yrs IM [40102] 2)  Admin 1st Vaccine [90471] 3)  Admin 1st Vaccine [90471] 4)  Flu Vaccine 70yrs + [72536] 5)  T-Knee Comp Left 4 Views [73564TC] 6)  T-Ankle Comp Left Min 3 Views [73610TC] 7)  New Patient Level IV [64403]   Immunizations Administered:  Tetanus Vaccine:    Vaccine Type: Tdap    Site: right deltoid    Mfr: GlaxoSmithKline    Dose: 0.5 ml    Route: IM    Given by: Alysia Penna    Exp. Date: 11/28/2011    Lot #: [id number]    VIS given: 12/27/07 version given November 26, 2009.   Immunizations Administered:  Tetanus Vaccine:    Vaccine Type: Tdap    Site: right deltoid    Mfr: GlaxoSmithKline    Dose: 0.5 ml    Route: IM    Given by: Alysia Penna    Exp. Date: 11/28/2011    Lot #: [id number]    VIS given: 12/27/07 version given November 26, 2009.  Flu Vaccine Consent Questions     Do you have a history of severe allergic reactions to this vaccine? no    Any prior history of allergic reactions to egg and/or gelatin? no    Do you have a sensitivity to the preservative Thimersol? no    Do you have a past history of Guillan-Barre Syndrome? no    Do you currently have an acute febrile illness? no    Have you ever had a severe reaction to latex? no    Vaccine information given and explained to patient? yes    Are you currently pregnant? no    Lot Number:[id number]   Exp Date:08/08/2010   Site Given  Left Deltoid [id number]

## 2010-03-10 NOTE — Progress Notes (Signed)
Summary: RESULTS  Phone Note Call from Patient Call back at Home Phone [phone]   Caller: Patient Call For: Etta Grandchild MD Summary of Call: Pt was told from the hospital to inform Dr. Yetta Barre that he was in the hospital on 12/19/09 and 12/20/09. Pt also stated that he didnt get a shoulder xray resut and please call him tmrw 12/26/09 if we can. Thanks Initial call taken by: Livingston Diones,  December 25, 2009 9:24 AM  Follow-up for Phone Call        shoulder xray was normal Follow-up by: Etta Grandchild MD,  December 25, 2009 9:42 AM  Additional Follow-up for Phone Call Additional follow up Details #1::        left detailed vm on pt's home # Additional Follow-up by: Lamar Sprinkles, CMA,  December 25, 2009 10:45 AM

## 2010-03-10 NOTE — Medication Information (Signed)
Summary: coumadin f/u  Anticoagulant Therapy  Managed by: Weston Brass, PharmD Referring MD: Olga Millers MD Supervising MD: Gala Romney MD, Reuel Boom Indication 1: Atrial Fibrillation (ICD-427.31) Indication 2: Transcient Ischemic Attack Lab Used: [facility] Site: [name] INR POC 1.8 INR RANGE 2-3  Dietary changes: no    Health status changes: yes       Details: restarted Coumadin last week.  Had been off Coumadin for about a year.  He took himself off Coumadin due to financial concerns but Dr. Jens Som restarted it last week.   Bleeding/hemorrhagic complications: no    Recent/future hospitalizations: no    Any changes in medication regimen? no    Recent/future dental: no  Any missed doses?: no       Is patient compliant with meds? yes       Anticoagulation Management History:      The patient is taking warfarin and comes in today for a routine follow up visit.  Negative risk factors for bleeding include an age less than 60 years old.  The bleeding index is 'low risk'.  Positive CHADS2 values include History of HTN.  Negative CHADS2 values include Age > 12 years old.  The start date was 05/23/2007.  His last INR was 5.3 RATIO.  Anticoagulation responsible provider: Angelicia Lessner MD, Reuel Boom.  INR POC: 1.8.  Cuvette Lot#: [id number].  Exp: 09/2010.    Anticoagulation Management Assessment/Plan:      The patient's current anticoagulation dose is Warfarin sodium 5 mg tabs: Use as directed by Anticoagulation Clinic.  The target INR is 2.0-3.0.  The next INR is due 07/08/2009.  Anticoagulation instructions were given to patient.  Results were reviewed/authorized by Weston Brass, PharmD.  He was notified by Weston Brass PharmD.         Prior Anticoagulation Instructions: 7.5MG  QD  Current Anticoagulation Instructions: INR 1.8  Continue same dose of 1 1/2 tablets every day.

## 2010-03-10 NOTE — Medication Information (Signed)
Summary: coumadin f/u sl  Anticoagulant Therapy  Managed by: Lyna Poser, PharmD Referring MD: Olga Millers MD Supervising MD: Shirlee Latch MD,Dalton Indication 1: Atrial Fibrillation (ICD-427.31) Indication 2: Transcient Ischemic Attack Lab Used: [facility] Site: [name] INR POC 2.9 INR RANGE 2-3  Dietary changes: no    Health status changes: no    Bleeding/hemorrhagic complications: no    Recent/future hospitalizations: no    Any changes in medication regimen? no    Recent/future dental: no  Any missed doses?: no       Is patient compliant with meds? yes       Anticoagulation Management History:      The patient is taking warfarin and comes in today for a routine follow up visit.  Negative risk factors for bleeding include an age less than 56 years old.  The bleeding index is 'low risk'.  Positive CHADS2 values include History of HTN.  Negative CHADS2 values include Age > 42 years old.  The start date was 05/23/2007.  His last INR was 5.3 RATIO.  Anticoagulation responsible Keala Drum: Shirlee Latch MD,Dalton.  INR POC: 2.9.  Cuvette Lot#: [id number].  Exp: 11/2010.    Anticoagulation Management Assessment/Plan:      The patient's current anticoagulation dose is Warfarin sodium 5 mg tabs: Use as directed by Anticoagulation Clinic.  The target INR is 2.0-3.0.  The next INR is due 11/14/2009.  Anticoagulation instructions were given to patient.  Results were reviewed/authorized by Lyna Poser, PharmD.         Prior Anticoagulation Instructions: INR 4.0  Skip tomorrow's dosage of Coumadin, then start taking 1 tablet daily except 1.5 tablets on Sundays and Thursdays.  Recheck in 2-3 weeks.    Current Anticoagulation Instructions: INR 2.9  No changes today. Continue same dose of 1 tablet everday except 1.5 on sunday and thursday. we'll see you in 4 weeks.

## 2010-03-10 NOTE — Progress Notes (Signed)
Summary: Call Report  Phone Note Other Incoming   Caller: [facility] Summary of Call: [facility] Triage Call Report Triage Record Num: [id number] Operator: Tomasita Crumble Patient Name: [name] Call Date & Time: 12/21/2009 8:28:06AM Patient Phone: [phone] PCP: Patient Gender: Male PCP Fax : Patient DOB: 07-05-1950 Practice Name: [facility] Reason for Call: Pt. calling about headache 3-4 / 10 and agitation, not able to sleep; states he sleeps 10-15 min at a time. Advised see at [facility] as pt. does not feel he can wait 24 hours for evaluation. Sleep Disorders protocol used. Protocol(s) Used: Sleep Disorders Recommended Outcome per Protocol: See Provider within 24 hours Reason for Outcome: Sleep deprivation AND irritable, agitated or multiple episodes of apnea per night Care Advice:  ~ 11/ Initial call taken by: Margaret Pyle, CMA,  December 22, 2009 8:15 AM

## 2010-03-10 NOTE — Medication Information (Signed)
Summary: rov/nb   Anticoagulant Therapy  Managed by: Weston Brass, PharmD Referring MD: Olga Millers MD PCP: Etta Grandchild MD Supervising MD: Excell Seltzer MD, Casimiro Needle Indication 1: Atrial Fibrillation (ICD-427.31) Indication 2: Transcient Ischemic Attack Lab Used: [facility] Site: [name] INR POC 2.3 INR RANGE 2-3  Dietary changes: no    Health status changes: no    Bleeding/hemorrhagic complications: no    Recent/future hospitalizations: no    Any changes in medication regimen? no    Recent/future dental: no  Any missed doses?: no       Is patient compliant with meds? yes      Comments: Pt refuses to make f/u appt at this time due to financial situation.  Advised pt to fill out paper work for [employer] care with [facility].  He said he would think about it.  He is aware that his Coumadin cannot be refilled unless he has his INR monitored.  He is aware of the risk of stroke if he stops his Coumadin.  Will make Dr. Jens Som aware.   Allergies: 1)  ! Aspirin  Anticoagulation Management History:      The patient is taking warfarin and comes in today for a routine follow up visit.  Positive risk factors for bleeding include history of CVA/TIA.  Negative risk factors for bleeding include an age less than 61 years old.  The bleeding index is 'intermediate risk'.  Positive CHADS2 values include History of HTN and Prior Stroke/CVA/TIA.  Negative CHADS2 values include Age > 61 years old and History of Diabetes.  The start date was 05/23/2007.  His last INR was 5.3 RATIO.  Anticoagulation responsible provider: Excell Seltzer MD, Casimiro Needle.  INR POC: 2.3.  Exp: 12/2010.    Anticoagulation Management Assessment/Plan:      The patient's current anticoagulation dose is Warfarin sodium 5 mg tabs: Use as directed by Anticoagulation Clinic.  The target INR is 2.0-3.0.  The next INR is due 02/06/2010.  Anticoagulation instructions were given to patient.  Results were reviewed/authorized by Weston Brass,  PharmD.  He was notified by Weston Brass PharmD.         Prior Anticoagulation Instructions: INR 2.2 Continue previous dose of 1 tablet everyday except 1.5 tablets on Sunday and Thursday. Recheck INR in 4 weeks.  Current Anticoagulation Instructions: INR 2.3  Continue same dose of 1 tablet every day except 1 1/2 tablets on Sunday and Thursday.  Recheck INR in 4 weeks.

## 2010-03-10 NOTE — Medication Information (Signed)
Summary: rov/tm  Anticoagulant Therapy  Managed by: Weston Brass, PharmD Referring MD: Olga Millers MD Supervising MD: Clifton James MD, Cristal Deer Indication 1: Atrial Fibrillation (ICD-427.31) Indication 2: Transcient Ischemic Attack Lab Used: [facility]  Site: [name] INR POC 2.7 INR RANGE 2-3  Dietary changes: no    Health status changes: no    Bleeding/hemorrhagic complications: no    Recent/future hospitalizations: no    Any changes in medication regimen? no    Recent/future dental: no  Any missed doses?: no       Is patient compliant with meds? yes       Anticoagulation Management History:      The patient is taking warfarin and comes in today for a routine follow up visit.  Negative risk factors for bleeding include an age less than 41 years old.  The bleeding index is 'low risk'.  Positive CHADS2 values include History of HTN.  Negative CHADS2 values include Age > 3 years old.  The start date was 05/23/2007.  His last INR was 5.3 RATIO.  Anticoagulation responsible provider: Clifton James MD, Cristal Deer.  INR POC: 2.7.  Cuvette Lot#: [id number].  Exp: 11/2010.    Anticoagulation Management Assessment/Plan:      The patient's current anticoagulation dose is Warfarin sodium 5 mg tabs: Use as directed by Anticoagulation Clinic.  The target INR is 2.0-3.0.  The next INR is due 10/02/2009.  Anticoagulation instructions were given to patient.  Results were reviewed/authorized by Weston Brass, PharmD.  He was notified by Gweneth Fritter, PharmD Candidate.         Prior Anticoagulation Instructions: INR 3.5 Skip today's dose then change dose to 1 pill everyday except 1.5 pills on Tuesdays, Thursdays and Sundays. Recheck in 2 weeks.   Current Anticoagulation Instructions: INR-2.7  Continue taking 1 tablet (5mg ) every day except take 1.5 tablets (7.5mg ) on Sun, Tues, and Thurs.

## 2010-03-10 NOTE — Medication Information (Signed)
Summary: rov/sp  Anticoagulant Therapy  Managed by: Weston Brass, PharmD Referring MD: Olga Millers MD Supervising MD: Excell Seltzer MD, Casimiro Needle Indication 1: Atrial Fibrillation (ICD-427.31) Indication 2: Transcient Ischemic Attack Lab Used: [facility] Site: [name] INR POC 3.0 INR RANGE 2-3  Dietary changes: no    Health status changes: no    Bleeding/hemorrhagic complications: no    Recent/future hospitalizations: no    Any changes in medication regimen? no    Recent/future dental: no  Any missed doses?: no       Is patient compliant with meds? yes       Anticoagulation Management History:      The patient is taking warfarin and comes in today for a routine follow up visit.  Negative risk factors for bleeding include an age less than 109 years old.  The bleeding index is 'low risk'.  Positive CHADS2 values include History of HTN.  Negative CHADS2 values include Age > 49 years old.  The start date was 05/23/2007.  His last INR was 5.3 RATIO.  Anticoagulation responsible provider: Excell Seltzer MD, Casimiro Needle.  INR POC: 3.0.  Cuvette Lot#: [id number].  Exp: 09/2010.    Anticoagulation Management Assessment/Plan:      The patient's current anticoagulation dose is Warfarin sodium 5 mg tabs: Use as directed by Anticoagulation Clinic.  The target INR is 2.0-3.0.  The next INR is due 08/12/2009.  Anticoagulation instructions were given to patient.  Results were reviewed/authorized by Weston Brass, PharmD.  He was notified by Weston Brass PharmD.         Prior Anticoagulation Instructions: INR 3.0  Continue same dose of 1 1/2 tablets every day except 1 tablet on Monday and Friday   Current Anticoagulation Instructions: INR 3.0  Continue same dose fo 1 1/2 tablets daily except 1 tablet on Monday and Friday

## 2010-03-10 NOTE — Progress Notes (Signed)
   Request from DDS for records forwarded to [facility] for processing. [name]  March 10, 2009 8:27 AM

## 2010-03-10 NOTE — Progress Notes (Signed)
Summary: calling back from friday  Medications Added PRAVASTATIN SODIUM 40 MG TABS (PRAVASTATIN SODIUM) Take one tablet by mouth daily at bedtime       ---- Converted from flag ---- ---- 07/30/2009 4:22 PM, Ferman Hamming, MD, [facility] wrote: dc zocor; pravachol 40 q day; lipids and liver in six weeks  ---- 07/30/2009 11:34 AM, Deliah Goody, RN wrote: pt is on zocor and norvasc. do we need to change to different statin? ------------------------------  Phone Note Outgoing Call   Call placed by: Deliah Goody, RN,  August 01, 2009 5:36 PM Summary of Call: Left message to call back Deliah Goody, RN  August 01, 2009 5:37 PM   Follow-up for Phone Call        per pt calling back to speak with debra / Dr.r.Coco Sharpnack [phone] Lorne Skeens  August 04, 2009 10:36 AM   spoke with pt, he is aware of the change and will have labs repeated in august. Deliah Goody, RN  August 04, 2009 10:54 AM     New/Updated Medications: PRAVASTATIN SODIUM 40 MG TABS (PRAVASTATIN SODIUM) Take one tablet by mouth daily at bedtime Prescriptions: PRAVASTATIN SODIUM 40 MG TABS (PRAVASTATIN SODIUM) Take one tablet by mouth daily at bedtime  #30 x 12   Entered by:   Deliah Goody, RN   Authorized by:   Ferman Hamming, MD, [facility]   Signed by:   Deliah Goody, RN on 08/04/2009   Method used:   Electronically to        [facility] (retail)       [address]       [place], [place]  [address]       Ph: [phone]       Fax: [phone]   RxID:   [id number]

## 2010-03-10 NOTE — Medication Information (Signed)
Summary: rov/sp  Anticoagulant Therapy  Managed by: Bethena Midget, RN, BSN Referring MD: Olga Millers MD Supervising MD: Graciela Husbands MD, Viviann Spare Indication 1: Atrial Fibrillation (ICD-427.31) Indication 2: Transcient Ischemic Attack Lab Used: [facility] Site: [name] INR POC 3.6 INR RANGE 2-3  Dietary changes: no    Health status changes: no    Bleeding/hemorrhagic complications: no    Recent/future hospitalizations: no    Any changes in medication regimen? yes       Details: Pravastatin added  Recent/future dental: no  Any missed doses?: no       Is patient compliant with meds? yes       Anticoagulation Management History:      The patient is taking warfarin and comes in today for a routine follow up visit.  Negative risk factors for bleeding include an age less than 32 years old.  The bleeding index is 'low risk'.  Positive CHADS2 values include History of HTN.  Negative CHADS2 values include Age > 9 years old.  The start date was 05/23/2007.  His last INR was 5.3 RATIO.  Anticoagulation responsible provider: Graciela Husbands MD, Viviann Spare.  INR POC: 3.6.  Cuvette Lot#: [id number].  Exp: 09/2010.    Anticoagulation Management Assessment/Plan:      The patient's current anticoagulation dose is Warfarin sodium 5 mg tabs: Use as directed by Anticoagulation Clinic.  The target INR is 2.0-3.0.  The next INR is due 08/28/2009.  Anticoagulation instructions were given to patient.  Results were reviewed/authorized by Bethena Midget, RN, BSN.  He was notified by Bethena Midget, RN, BSN.         Prior Anticoagulation Instructions: INR 3.0  Continue same dose fo 1 1/2 tablets daily except 1 tablet on Monday and Friday   Current Anticoagulation Instructions: INR 3.6 Skip tomorrow's dose then change dose to 1 1/2 pills everyday except 1 pill on Mondays, Wednesdays and Fridays. Recheck in 2 weeks.

## 2010-03-10 NOTE — Progress Notes (Signed)
Medications Added METOPROLOL TARTRATE 50 MG TABS (METOPROLOL TARTRATE) Take one tablet by mouth twice a day LISINOPRIL 5 MG TABS (LISINOPRIL) Take one tablet by mouth daily FUROSEMIDE 40 MG TABS (FUROSEMIDE) Take one tablet by mouth daily. AMLODIPINE BESYLATE 10 MG TABS (AMLODIPINE BESYLATE) Take one tablet by mouth daily       Phone Note Outgoing Call   Call placed by: Deliah Goody, RN,  May 28, 2009 3:30 PM Summary of Call: spoke with pt abouit making an appt. follow up made. scripts refilled to pharm Deliah Goody, RN  May 28, 2009 3:31 PM     New/Updated Medications: METOPROLOL TARTRATE 50 MG TABS (METOPROLOL TARTRATE) Take one tablet by mouth twice a day LISINOPRIL 5 MG TABS (LISINOPRIL) Take one tablet by mouth daily FUROSEMIDE 40 MG TABS (FUROSEMIDE) Take one tablet by mouth daily. AMLODIPINE BESYLATE 10 MG TABS (AMLODIPINE BESYLATE) Take one tablet by mouth daily Prescriptions: LISINOPRIL 5 MG TABS (LISINOPRIL) Take one tablet by mouth daily  #30 x 12   Entered by:   Deliah Goody, RN   Authorized by:   Ferman Hamming, MD, [facility]   Signed by:   Deliah Goody, RN on 05/28/2009   Method used:   Electronically to        [facility] (retail)       [address]       [place], [place]  [address]       Ph: [phone]       Fax: [phone]   RxID:   [id number] AMLODIPINE BESYLATE 10 MG TABS (AMLODIPINE BESYLATE) Take one tablet by mouth daily  #30 x 12   Entered by:   Deliah Goody, RN   Authorized by:   Ferman Hamming, MD, [facility]   Signed by:   Deliah Goody, RN on 05/28/2009   Method used:   Electronically to        [facility] (retail)       [address]       [place], [place]  [address]       Ph: [phone]       Fax: [phone]   RxID:   [id number] FUROSEMIDE 40 MG TABS (FUROSEMIDE) Take one tablet by mouth daily.  #30 x 12   Entered by:   Deliah Goody, RN   Authorized  by:   Ferman Hamming, MD, [facility]   Signed by:   Deliah Goody, RN on 05/28/2009   Method used:   Electronically to        [facility] (retail)       [address]       [place], [place]  [address]       Ph: [phone]       Fax: [phone]   RxID:   [id number] METOPROLOL TARTRATE 50 MG TABS (METOPROLOL TARTRATE) Take one tablet by mouth twice a day  #60 x 12   Entered by:   Deliah Goody, RN   Authorized by:   Ferman Hamming, MD, [facility]   Signed by:   Deliah Goody, RN on 05/28/2009   Method used:   Electronically to        [facility] (retail)       [address]       [place], [place]  [address]       Ph: [phone]       Fax: [phone]   RxID:   [id number]

## 2010-03-10 NOTE — Medication Information (Signed)
Summary: rov/sel      Allergies Added:  Anticoagulant Therapy  Managed by: Weston Brass, PharmD Referring MD: Olga Millers MD PCP: Etta Grandchild MD Supervising MD: Juanda Chance MD, Tarika Mckethan Indication 1: Atrial Fibrillation (ICD-427.31) Indication 2: Transcient Ischemic Attack Lab Used: [facility] Site: [name] INR POC 2.2 INR RANGE 2-3  Dietary changes: no    Health status changes: yes       Details: Recent shoulder pain  Bleeding/hemorrhagic complications: no    Recent/future hospitalizations: no    Any changes in medication regimen? yes       Details: Tramadol 50 mg QID PRN pain 2/2 shoulder injury  Recent/future dental: no  Any missed doses?: no       Is patient compliant with meds? yes       Allergies (verified): 1)  ! Aspirin  Anticoagulation Management History:      The patient is taking warfarin and comes in today for a routine follow up visit.  Positive risk factors for bleeding include history of CVA/TIA.  Negative risk factors for bleeding include an age less than 69 years old.  The bleeding index is 'intermediate risk'.  Positive CHADS2 values include History of HTN and Prior Stroke/CVA/TIA.  Negative CHADS2 values include Age > 45 years old and History of Diabetes.  The start date was 05/23/2007.  His last INR was 5.3 RATIO.  Anticoagulation responsible provider: Juanda Chance MD, Smitty Cords.  INR POC: 2.2.  Cuvette Lot#: [id number].  Exp: 12/2010.    Anticoagulation Management Assessment/Plan:      The patient's current anticoagulation dose is Warfarin sodium 5 mg tabs: Use as directed by Anticoagulation Clinic.  The target INR is 2.0-3.0.  The next INR is due 01/09/2010.  Anticoagulation instructions were given to patient.  Results were reviewed/authorized by Weston Brass, PharmD.  He was notified by Hoy Register, PharmD Candidate.         Prior Anticoagulation Instructions: INR 3.0  Continue taking 1 tablet everyday except 1 1/2 tablet on Sunday and Thursday. Recheck in 4  weeks.   Current Anticoagulation Instructions: INR 2.2 Continue previous dose of 1 tablet everyday except 1.5 tablets on Sunday and Thursday. Recheck INR in 4 weeks.

## 2010-03-10 NOTE — Assessment & Plan Note (Signed)
Summary: 3 month rov.sl  Medications Added LISINOPRIL 20 MG TABS (LISINOPRIL) Take one tablet by mouth daily        CC:  dizziness and sob.Marland KitchenMarland KitchenMarland KitchenMarland Kitchenpt didnot bring med list.  History of Present Illness: Pleasant male with past medical history of atrial fibrillation and prior TIA associated with this. In 2009 his LV function was normal and a previous nuclear study showed no scar or ischemia. Echocardiogram in June of 2011 showed biatrial enlargement. LV function was not mentioned. There was moderate mitral regurgitation. I last saw him in May of 2011. Since then patient complains of dyspnea on exertion relieved with rest. There is no orthopnea or PND. There is occasional mild pedal edema. There is no chest pain, palpitations or syncope. There is no bleeding.  Current Medications (verified): 1)  Metoprolol Tartrate 50 Mg Tabs (Metoprolol Tartrate) .... Take One Tablet By Mouth Twice A Day 2)  Lisinopril 5 Mg Tabs (Lisinopril) .... Take One Tablet By Mouth Daily 3)  Furosemide 40 Mg Tabs (Furosemide) .... Take One Tablet By Mouth Daily. 4)  Amlodipine Besylate 10 Mg Tabs (Amlodipine Besylate) .... Take One Tablet By Mouth Daily 5)  Warfarin Sodium 5 Mg Tabs (Warfarin Sodium) .... Use As Directed By Anticoagulation Clinic 6)  Pravastatin Sodium 40 Mg Tabs (Pravastatin Sodium) .... Take One Tablet By Mouth Daily At Bedtime  Past History:  Past Medical History: Atrial fibrillation hypertension. Hyperlipidemia  History of amaurosis fugax in the left eye   Social History: Reviewed history from 04/18/2008 and no changes required. He lives in [place] with his mother.  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  Alcohol Use - no  Review of Systems       Complains of fatigue and leg pain but no fevers or chills, productive cough, hemoptysis, dysphasia, odynophagia, melena, hematochezia, dysuria, hematuria, rash, seizure activity, orthopnea, PND,  claudication. Remaining systems  are negative.   Vital Signs:  Patient profile:   60 year old male Height:      71 inches Weight:      207 pounds BMI:     28.97 Pulse rate:   71 / minute Resp:     14 per minute BP sitting:   135 / 96  (left arm)  Vitals Entered By: Kem Parkinson (November 14, 2009 10:06 AM)  Physical Exam  General:  Well-developed well-nourished in no acute distress.  Skin is warm and dry.  HEENT is normal.  Neck is supple. No thyromegaly.  Chest is clear to auscultation with normal expansion.  Cardiovascular exam is irregular Abdominal exam nontender or distended. No masses palpated. Extremities show no edema. neuro grossly intact    EKG  Procedure date:  11/14/2009  Findings:      Atrial fibrillation at a rate of 71. RV conduction delay.  Impression & Recommendations:  Problem # 1:  TOBACCO ABUSE (ICD-305.1) Patient counseled on discontinuing.  Problem # 2:  DYSPNEA (ICD-786.05) Patient is not volume overloaded on examination. There may be a component of COPD. Check BNP. If elevated increased diuretics. His updated medication list for this problem includes:    Metoprolol Tartrate 50 Mg Tabs (Metoprolol tartrate) .Marland Kitchen... Take one tablet by mouth twice a day    Lisinopril 20 Mg Tabs (Lisinopril) .Marland Kitchen... Take one tablet by mouth daily    Furosemide 40 Mg Tabs (Furosemide) .Marland Kitchen... Take one tablet by mouth daily.    Amlodipine Besylate 10 Mg Tabs (Amlodipine besylate) .Marland Kitchen... Take one tablet  by mouth daily  Problem # 3:  COPD (ICD-496) Management per primary care.  Problem # 4:  HYPERCHOLESTEROLEMIA-PURE (ICD-272.0) Continue statin. Check lipids and liver. Given complaints of leg pain check CK. His updated medication list for this problem includes:    Pravastatin Sodium 40 Mg Tabs (Pravastatin sodium) .Marland Kitchen... Take one tablet by mouth daily at bedtime  Problem # 5:  HYPERTENSION, BENIGN ESSENTIAL (ICD-401.1) Blood pressure elevated. Increase lisinopril to 20 mg p.o. daily. Check  potassium and renal function in one week. His updated medication list for this problem includes:    Metoprolol Tartrate 50 Mg Tabs (Metoprolol tartrate) .Marland Kitchen... Take one tablet by mouth twice a day    Lisinopril 20 Mg Tabs (Lisinopril) .Marland Kitchen... Take one tablet by mouth daily    Furosemide 40 Mg Tabs (Furosemide) .Marland Kitchen... Take one tablet by mouth daily.    Amlodipine Besylate 10 Mg Tabs (Amlodipine besylate) .Marland Kitchen... Take one tablet by mouth daily  Problem # 6:  ATRIAL FIBRILLATION (ICD-427.31) Continue beta blocker and Coumadin. His updated medication list for this problem includes:    Metoprolol Tartrate 50 Mg Tabs (Metoprolol tartrate) .Marland Kitchen... Take one tablet by mouth twice a day    Warfarin Sodium 5 Mg Tabs (Warfarin sodium) ..... Use as directed by anticoagulation clinic  Patient Instructions: 1)  Your physician recommends that you schedule a follow-up appointment in: 6 MONTHS 2)  Your physician recommends that you return for lab work in:ONE WEEK-FASTING 3)  Your physician has recommended you make the following change in your medication: INCREASE LISINOPRIL 20MG  ONCE DAILY Prescriptions: LISINOPRIL 20 MG TABS (LISINOPRIL) Take one tablet by mouth daily  #30 x 12   Entered by:   Deliah Goody, RN   Authorized by:   Ferman Hamming, MD, [facility]   Signed by:   Deliah Goody, RN on 11/14/2009   Method used:   Electronically to        [facility] (retail)       [address]       [place], [place]  [address]       Ph: [phone]       Fax: [phone]   RxID:   [id number]

## 2010-03-10 NOTE — Medication Information (Signed)
Summary: Coumadin Clinic  Anticoagulant Therapy  Managed by: Inactive Referring MD: Olga Millers MD Supervising MD: Daleen Squibb MD, Maisie Fus Indication 1: Atrial Fibrillation (ICD-427.31) Indication 2: Transcient Ischemic Attack Lab Used: [facility] Site: [address] INR RANGE 2-3          Comments: Called spoke with pt.  Pt states he is no longer on coumadin.  Pt discontinued all medications on his own.  Flag sent to Dr Jens Som to make aware. Pt aware of risks.  Cloyde Reams RN  February 28, 2009 10:05 AM   Anticoagulation Management History:      Negative risk factors for bleeding include an age less than 70 years old.  The bleeding index is 'low risk'.  Positive CHADS2 values include History of HTN.  Negative CHADS2 values include Age > 73 years old.  The start date was 05/23/2007.  His last INR was 5.3 RATIO.  Anticoagulation responsible provider: Daleen Squibb MD, Maisie Fus.    Anticoagulation Management Assessment/Plan:      The patient's current anticoagulation dose is Coumadin 5 mg tabs: 1 1/2 daily.  The target INR is 0 - 0.  Results were reviewed/authorized by Inactive.         Prior Anticoagulation Instructions: 7.5MG  QD

## 2010-03-10 NOTE — Progress Notes (Signed)
----   Converted from flag ---- ---- 02/28/2009 10:02 AM, Cloyde Reams RN wrote: Jeanene Erb spoke with pt, he states he has self-discontinued all medications at the present d/t unable to afford medications or office visits.  Pt was advised of risks.  Please be aware.  Thanks. ------------------------------

## 2010-03-11 ENCOUNTER — Inpatient Hospital Stay (HOSPITAL_COMMUNITY): Payer: Self-pay

## 2010-03-11 LAB — BASIC METABOLIC PANEL
BUN: 13 mg/dL (ref 6–23)
Chloride: 101 mEq/L (ref 96–112)
Creatinine, Ser: 1 mg/dL (ref 0.4–1.5)

## 2010-03-11 LAB — CBC
HCT: 44.2 % (ref 39.0–52.0)
MCHC: 36 g/dL (ref 30.0–36.0)
RDW: 12.7 % (ref 11.5–15.5)

## 2010-03-11 LAB — CARDIAC PANEL(CRET KIN+CKTOT+MB+TROPI)
CK, MB: 2.7 ng/mL (ref 0.3–4.0)
Relative Index: 1.8 (ref 0.0–2.5)
Troponin I: 0.01 ng/mL (ref 0.00–0.06)
Troponin I: 0.01 ng/mL (ref 0.00–0.06)

## 2010-03-11 LAB — PROTIME-INR
INR: 1.28 (ref 0.00–1.49)
Prothrombin Time: 16.2 seconds — ABNORMAL HIGH (ref 11.6–15.2)

## 2010-03-12 DIAGNOSIS — I4891 Unspecified atrial fibrillation: Secondary | ICD-10-CM

## 2010-03-12 LAB — BASIC METABOLIC PANEL
BUN: 12 mg/dL (ref 6–23)
Calcium: 9.7 mg/dL (ref 8.4–10.5)
Creatinine, Ser: 0.9 mg/dL (ref 0.4–1.5)
GFR calc non Af Amer: >60 mL/min (ref >60)
Glucose, Bld: 103 mg/dL — ABNORMAL HIGH (ref 70–99)
Sodium: 139 mEq/L (ref 135–145)

## 2010-03-12 LAB — PROTIME-INR: Prothrombin Time: 17.3 seconds — ABNORMAL HIGH (ref 11.6–15.2)

## 2010-03-13 ENCOUNTER — Inpatient Hospital Stay (HOSPITAL_COMMUNITY): Payer: Self-pay

## 2010-03-13 ENCOUNTER — Encounter: Payer: Self-pay | Admitting: Cardiology

## 2010-03-13 LAB — CBC
Hemoglobin: 15.4 g/dL (ref 13.0–17.0)
MCHC: 36.2 g/dL — ABNORMAL HIGH (ref 30.0–36.0)
Platelets: 285 10*3/uL (ref 150–400)
RDW: 13.1 % (ref 11.5–15.5)

## 2010-03-13 LAB — PROTIME-INR: Prothrombin Time: 19.4 seconds — ABNORMAL HIGH (ref 11.6–15.2)

## 2010-03-14 LAB — CBC
Hemoglobin: 14.4 g/dL (ref 13.0–17.0)
MCHC: 34.3 g/dL (ref 30.0–36.0)
Platelets: 302 10*3/uL (ref 150–400)
RDW: 13.1 % (ref 11.5–15.5)
RDW: 12.9 % (ref 11.5–15.5)
WBC: 7.8 10*3/uL (ref 4.0–10.5)

## 2010-03-14 LAB — BRAIN NATRIURETIC PEPTIDE: Pro B Natriuretic peptide (BNP): 77 pg/mL (ref 0.0–100.0)

## 2010-03-14 LAB — PROTIME-INR
INR: 1.65 — ABNORMAL HIGH (ref 0.00–1.49)
Prothrombin Time: 19.7 seconds — ABNORMAL HIGH (ref 11.6–15.2)

## 2010-03-15 LAB — PROTIME-INR
INR: 1.6 — ABNORMAL HIGH (ref 0.00–1.49)
Prothrombin Time: 19.2 seconds — ABNORMAL HIGH (ref 11.6–15.2)

## 2010-03-15 LAB — CBC
Hemoglobin: 13.4 g/dL (ref 13.0–17.0)
RBC: 4.68 MIL/uL (ref 4.22–5.81)

## 2010-03-16 LAB — CBC
Hemoglobin: 13.9 g/dL (ref 13.0–17.0)
RBC: 4.69 MIL/uL (ref 4.22–5.81)

## 2010-03-16 LAB — PROTIME-INR
INR: 1.82 — ABNORMAL HIGH (ref 0.00–1.49)
Prothrombin Time: 21.2 seconds — ABNORMAL HIGH (ref 11.6–15.2)

## 2010-03-16 NOTE — H&P (Signed)
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     DATE OF BIRTH:  11/15/1950  DATE OF ADMISSION:  03/10/2010 DATE OF DISCHARGE:                             HISTORY & PHYSICAL   REFERRING PHYSICIAN:  Trudi Ida. Denton Lank, MD  PRIMARY CARDIOLOGIST:  Madolyn Frieze. Jens Som, MD, [facility]  CHIEF COMPLAINT:  Fatigue and rapid atrial fibrillation.  HISTORY OF PRESENT ILLNESS:  This is a 60 year old male with a history of chronic atrial fibrillation who ran out of medications 2-3 days ago and has been feeling poorly since.  Apparently, since he stopped his medications except his diuretics because he felt that.  He says he was depressed and had lack of energy as well as weakness and noted some palpitations.  He denies any chest pain, but has complained of increasing shortness of breath.  He denies any recent lower extremity edema, but feels tired in his legs.  He denies any fever, cough, nausea, vomiting, diaphoresis, but has had some chills.  ALLERGIES:  Questionable allergy to ASPIRIN.  MEDICATIONS:  Lasix, amlodipine, lisinopril, metoprolol, and pravastatin all of which he is unsure of the dose.  PAST MEDICAL HISTORY:  Chronic atrial fibrillation, hypertension, dyslipidemia, and TIA.  SOCIAL HISTORY:  He lives in [place].  He is divorced.  He has one child.  He denies any tobacco use.  He quit several weeks ago.  He denies any alcohol use.  PAST SURGICAL HISTORY:  None.  FAMILY HISTORY:  He is mother is alive with CAD.  His father died of a gastric cancer.  He has one son alive and well.  REVIEW OF SYSTEMS:  Other than what is stated in the HPI is negative.  PHYSICAL EXAMINATION:  VITAL SIGNS:  Blood pressure is 103/76 mmHg, heart rate 88, O2 saturations 99% on room air. GENERAL:  He is a well-developed, well-nourished  white male, in no acute distress. HEENT: Benign. NECK:  Supple without lymphadenopathy.  Carotid upstrokes are +2 bilaterally, no bruits. LUNGS:  Clear to auscultation throughout. HEART:  Irregularly irregular.  No murmurs, rubs, or gallops.  Normal S1 and S2. ABDOMEN:  Soft, nontender, nondistended.  Normoactive bowel sounds.  No hepatosplenomegaly. EXTREMITIES:  No cyanosis, erythema, or edema.  LABORATORY DATA:  Sodium 136, potassium 3.6, chloride 98, bicarb 21, BUN 13, creatinine 1.12, glucose 87.  White cell count 11.5, hemoglobin 16.3, hematocrit 46.2, platelet count 374.  LFTs normal.  INR 1.25, CPK 91, MB 1.9.  Chest x-ray is pending.  EKG shows AFib with RVR and ST depression 1 mm inferolateral leads. 1. Atrial fibrillation with rapid ventricular response.  The patient     stopped his medications 2-3 days ago.  Heart rate now 105 beats per     minute, on IV Cardizem drip. 2. Hypertension. 3. Dyslipidemia.  PLAN:  Admit to telemetry bed.  Cycle cardiac enzymes.  We will start Lovenox 1 mg/kg per pharmacy.  Start Coumadin per pharmacy.  I will start on IV Cardizem  drip at 5 mg per hour.  Further workup per [facility].     Armanda Magic, M.D.     TT/MEDQ  D:  03/14/2010  T:  03/15/2010  Job:  [id number]  Electronically Signed by Armanda Magic M.D. on 03/16/2010 04:00:49 PM

## 2010-03-19 ENCOUNTER — Encounter (INDEPENDENT_AMBULATORY_CARE_PROVIDER_SITE_OTHER): Payer: Self-pay

## 2010-03-19 ENCOUNTER — Encounter: Payer: Self-pay | Admitting: Internal Medicine

## 2010-03-19 DIAGNOSIS — I4891 Unspecified atrial fibrillation: Secondary | ICD-10-CM

## 2010-03-19 DIAGNOSIS — Z7901 Long term (current) use of anticoagulants: Secondary | ICD-10-CM

## 2010-03-26 NOTE — Medication Information (Signed)
Summary: Coumadin Clinic  Anticoagulant Therapy  Managed by: Weston Brass, PharmD Referring MD: Olga Millers MD PCP: Etta Grandchild MD Supervising MD: Johney Frame MD, Fayrene Fearing Indication 1: Atrial Fibrillation (ICD-427.31) Indication 2: Transcient Ischemic Attack Lab Used: [facility] Site: [name] INR POC 2.4 INR RANGE 2-3  Dietary changes: no    Health status changes: no    Bleeding/hemorrhagic complications: yes    Recent/future hospitalizations: yes       Details: Recently hospitalized due to Afib wtih rapid ventricular response.  Patient had been sick and did not feel like taking medications prior to admission.    Any changes in medication regimen? yes       Details: Diltiazem started in hospital   Recent/future dental: no  Any missed doses?: no       Is patient compliant with meds? yes       Allergies: 1)  ! Aspirin  Anticoagulation Management History:      The patient is taking warfarin and comes in today for a routine follow up visit.  Positive risk factors for bleeding include history of CVA/TIA.  Negative risk factors for bleeding include an age less than 39 years old.  The bleeding index is 'intermediate risk'.  Positive CHADS2 values include History of HTN and Prior Stroke/CVA/TIA.  Negative CHADS2 values include Age > 72 years old and History of Diabetes.  The start date was 05/23/2007.  His last INR was 5.3 RATIO.  Anticoagulation responsible provider: Kaizen Ibsen MD, Fayrene Fearing.  INR POC: 2.4.  Cuvette Lot#: [id number].  Exp: 12/2010.    Anticoagulation Management Assessment/Plan:      The patient's current anticoagulation dose is Warfarin sodium 5 mg tabs: Use as directed by Anticoagulation Clinic.  The target INR is 2.0-3.0.  The next INR is due 04/02/2010.  Anticoagulation instructions were given to patient.  Results were reviewed/authorized by Weston Brass, PharmD.  He was notified by Margot Chimes PharmD Candidate .         Prior Anticoagulation Instructions: INR  2.3  Continue same dose of 1 tablet every day except 1 1/2 tablets on Sunday and Thursday.  Recheck INR in 4 weeks.   Current Anticoagulation Instructions: INR 2.4  Restart your "pre-hospital" Coumadin dose of 1 tablet everyday except 1 and 1/2 tablets on Sundays and Thursdays.  Recheck in 2 weeks.

## 2010-03-30 DIAGNOSIS — I4891 Unspecified atrial fibrillation: Secondary | ICD-10-CM

## 2010-04-01 NOTE — Discharge Summary (Signed)
[name], [name]             ACCOUNT NO.:  [id number]  MEDICAL RECORD NO.:  [id number]           PATIENT TYPE:  I  LOCATION:  3741                         FACILITY:  [facility]  PHYSICIAN:  Arturo Morton. Riley Kill, MD, FACCDATE OF BIRTH:  1950-11-14  DATE OF ADMISSION:  03/10/2010 DATE OF DISCHARGE:  03/16/2010                              DISCHARGE SUMMARY   PRIMARY CARDIOLOGIST:  Madolyn Frieze. Jens Som, MD, [facility]  PRIMARY CARE PROVIDER:  Sanda Linger, MD  DISCHARGE DIAGNOSIS:  Atrial fibrillation with rapid ventricular response.  SECONDARY DIAGNOSES: 1. Chronic atrial fibrillation. 2. Medication nonadherence. 3. Hypertension. 4. Hyperlipidemia. 5. History of transient ischemic attack. 6. Moderate obstructive airway disease by pulmonary function tests     this admission.  ALLERGIES:  ASPIRIN.  PROCEDURES:  None.  HISTORY OF PRESENT ILLNESS:  A 60 year old male with history of permanent atrial fibrillation who ran out of his medications approximately 2-3 days prior to admission and was feeling poorly.  He presented to [facility] ED on March 10, 2010, who was noted to be in atrial fibrillation with a rapid response with rates in the 150s.  He was admitted for further evaluation.  HOSPITAL COURSE:  The patient ruled out for MI and was placed on intravenous diltiazem with improved rate control.  This was subsequently converted to p.o. diltiazem along with p.o. beta-blocker.  With additional titration of both oral agents, the patient's heart rate is improved to the 60-70 range.  On admission, his INR is subtherapeutic at 1.2, later he had been off his Coumadin.  Coumadin was resumed along with Lovenox bridge.  The patient's INR at this morning was 1.82 and plan is to keep him until 8 p.m. tonight, at which point we will give him an additional dose of Lovenox and then at that point discharge him.  We have arranged for followup INR in our office on March 19, 2010.  During this  admission, the patient underwent pulmonary function testing which showed moderate obstructive airway disease with an FVC of 89% and FEV of 65%.  The patient has not had any wheezing during this admission.  DISCHARGE LABS:  Hemoglobin 13.9, hematocrit 40.8, WBC 9.0, platelets 314.  INR 1.82.  Sodium 139, potassium 3.4, chloride 102, CO2 26, BUN 12, creatinine 0.90, glucose 103, calcium 9.7.  CK 146, MB 2.7, troponin I 0.01.  BNP 77.  DISPOSITION:  The patient will be discharged home today in good condition.  FOLLOWUP PLANS AND APPOINTMENTS:  The patient is to follow up with [facility] on March 19, 2010, at 4 p.m. Follow up with Dr. Jens Som on April 06, 2010, at 3:15 p.m.  DISCHARGE MEDICATIONS: 1. Diltiazem CD 180 mg daily. 2. Metoprolol tartrate 50 mg 1-1/2 tablets b.i.d. 3. Coumadin 5 mg 1-1/2 tablet at bedtime until directed otherwise by     Coumadin Clinic. 4. Pravachol 40 mg at bedtime.  OUTSTANDING LAB STUDIES:  Followup INR on March 19, 2010.  DURATION OF DISCHARGE ENCOUNTER:  60 minutes including physician time.     Nicolasa Ducking, ANP   ______________________________ Arturo Morton. Riley Kill, MD, [facility]    CB/MEDQ  D:  03/16/2010  T:  03/17/2010  Job:  [id number]  Electronically Signed by Nicolasa Ducking ANP on 03/23/2010 12:04:21 PM Electronically Signed by Shawnie Pons MD [name] on 04/01/2010 09:09:32 PM

## 2010-04-02 ENCOUNTER — Encounter (INDEPENDENT_AMBULATORY_CARE_PROVIDER_SITE_OTHER): Payer: Self-pay

## 2010-04-02 ENCOUNTER — Encounter: Payer: Self-pay | Admitting: Internal Medicine

## 2010-04-02 DIAGNOSIS — I4891 Unspecified atrial fibrillation: Secondary | ICD-10-CM

## 2010-04-02 DIAGNOSIS — Z7901 Long term (current) use of anticoagulants: Secondary | ICD-10-CM

## 2010-04-02 LAB — CONVERTED CEMR LAB: POC INR: 2.7

## 2010-04-07 ENCOUNTER — Encounter: Payer: Self-pay | Admitting: Cardiology

## 2010-04-07 ENCOUNTER — Encounter (INDEPENDENT_AMBULATORY_CARE_PROVIDER_SITE_OTHER): Payer: Self-pay | Admitting: Cardiology

## 2010-04-07 DIAGNOSIS — E7800 Pure hypercholesterolemia, unspecified: Secondary | ICD-10-CM

## 2010-04-07 DIAGNOSIS — I4891 Unspecified atrial fibrillation: Secondary | ICD-10-CM

## 2010-04-07 DIAGNOSIS — R0602 Shortness of breath: Secondary | ICD-10-CM

## 2010-04-14 ENCOUNTER — Telehealth: Payer: Self-pay | Admitting: Cardiology

## 2010-04-15 ENCOUNTER — Other Ambulatory Visit (INDEPENDENT_AMBULATORY_CARE_PROVIDER_SITE_OTHER): Payer: Self-pay

## 2010-04-15 ENCOUNTER — Encounter: Payer: Self-pay | Admitting: Cardiology

## 2010-04-15 ENCOUNTER — Encounter (INDEPENDENT_AMBULATORY_CARE_PROVIDER_SITE_OTHER): Payer: Self-pay | Admitting: *Deleted

## 2010-04-15 ENCOUNTER — Other Ambulatory Visit: Payer: Self-pay | Admitting: Cardiology

## 2010-04-15 DIAGNOSIS — R0602 Shortness of breath: Secondary | ICD-10-CM

## 2010-04-15 DIAGNOSIS — I509 Heart failure, unspecified: Secondary | ICD-10-CM

## 2010-04-15 LAB — LIPID PANEL
Cholesterol: 197 mg/dL (ref 0–200)
Total CHOL/HDL Ratio: 5
Triglycerides: 103 mg/dL (ref 0.0–149.0)

## 2010-04-15 LAB — HEPATIC FUNCTION PANEL
AST: 16 U/L (ref 0–37)
Albumin: 4 g/dL (ref 3.5–5.2)
Alkaline Phosphatase: 44 U/L (ref 39–117)

## 2010-04-16 NOTE — Assessment & Plan Note (Signed)
Summary: eph/F3W/per chris/mj  Medications Added METOPROLOL TARTRATE 50 MG TABS (METOPROLOL TARTRATE) Take one tablet and one half by mouth twice a day DILTIAZEM HCL ER BEADS 180 MG XR24H-CAP (DILTIAZEM HCL ER BEADS) Take one capsule by mouth daily        Primary Provider:  Etta Grandchild MD  CC:  dizziness and sob.  History of Present Illness: Pleasant male with past medical history of atrial fibrillation and prior TIA associated with this. In 2009 his LV function was normal and a previous nuclear study showed no scar or ischemia. Echocardiogram in June of 2011 showed biatrial enlargement. LV function was not mentioned. There was moderate mitral regurgitation. Patient was admitted in early February with atrial fibrillation with a rapid ventricular response. He also had congestive heart failure. However he had been taking none of his medications. These were resumed with improvement. PFTs revealed moderate obstructive disease. Since discharge, he continues to have dyspnea on exertion which is a chronic issue. There is no orthopnea, PND, pedal edema, palpitations, syncope or chest pain.   Current Medications (verified): 1)  Metoprolol Tartrate 50 Mg Tabs (Metoprolol Tartrate) .... Take One Tablet and One Half By Mouth Twice A Day 2)  Warfarin Sodium 5 Mg Tabs (Warfarin Sodium) .... Use As Directed By Anticoagulation Clinic 3)  Pravastatin Sodium 40 Mg Tabs (Pravastatin Sodium) .... Take One Tablet By Mouth Daily At Bedtime 4)  Tramadol Hcl 50 Mg Tabs (Tramadol Hcl) .Marland Kitchen.. 1-2 By Mouth Qid As Needed For Pain 5)  Diltiazem Hcl Er Beads 180 Mg Xr24h-Cap (Diltiazem Hcl Er Beads) .... Take One Capsule By Mouth Daily  Allergies: 1)  ! Aspirin  Past History:  Past Medical History: Atrial fibrillation hypertension. Hyperlipidemia  History of amaurosis fugax in the left eye  COPD  Past Surgical History: Reviewed history from 11/26/2009 and no changes required. Denies surgical  history  Social History: Reviewed history from 11/26/2009 and no changes required. He lives in [place] with his mother.  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  Alcohol Use - no Drug use-no Regular exercise-no  Review of Systems       Pt complains of fatigue but no fevers or chills, productive cough, hemoptysis, dysphasia, odynophagia, melena, hematochezia, dysuria, hematuria, rash, seizure activity, orthopnea, PND, pedal edema, claudication. Remaining systems are negative.   Vital Signs:  Patient profile:   60 year old male Height:      71 inches Weight:      190 pounds BMI:     26.60 Pulse rate:   67 / minute Resp:     16 per minute BP sitting:   118 / 80  (left arm)  Vitals Entered By: Deliah Goody, RN (April 07, 2010 3:18 PM)  Physical Exam  General:  Well-developed well-nourished in no acute distress.  Skin is warm and dry.  HEENT is normal.  Neck is supple. No thyromegaly.  Chest is clear to auscultation with normal expansion.  Cardiovascular exam is irregular Abdominal exam nontender or distended. No masses palpated. Extremities show no edema. neuro grossly intact    EKG  Procedure date:  04/07/2010  Findings:      Atrial fibrillation at a rate of 67. Nonspecific ST changes. Her  Impression & Recommendations:  Problem # 1:  TOBACCO ABUSE (ICD-305.1) Patient states he has decreased to 2 cigarettes daily. I discussed the importance of discontinuing.  Problem # 2:  DYSPNEA (ICD-786.05) Most likely multifactorial. I think he does  have COPD based on pulmonary function tests. There may be a component related to atrial fibrillation. He declined cardioversion in the past and has not been in atrial fibrillation for several years. I think we should continue with rate control and anticoagulation. The following medications were removed from the medication list:    Lisinopril 20 Mg Tabs (Lisinopril) .Marland Kitchen... Take one tablet by mouth daily     Furosemide 40 Mg Tabs (Furosemide) .Marland Kitchen... Take one tablet by mouth daily.    Amlodipine Besylate 10 Mg Tabs (Amlodipine besylate) .Marland Kitchen... Take one tablet by mouth daily His updated medication list for this problem includes:    Metoprolol Tartrate 50 Mg Tabs (Metoprolol tartrate) .Marland Kitchen... Take one tablet and one half by mouth twice a day    Diltiazem Hcl Er Beads 180 Mg Xr24h-cap (Diltiazem hcl er beads) .Marland Kitchen... Take one capsule by mouth daily  Problem # 3:  COPD (ICD-496)  Problem # 4:  HYPERCHOLESTEROLEMIA-PURE (ICD-272.0) Continue statin. Check lipids and liver. His updated medication list for this problem includes:    Pravastatin Sodium 40 Mg Tabs (Pravastatin sodium) .Marland Kitchen... Take one tablet by mouth daily at bedtime  Problem # 5:  ATRIAL FIBRILLATION (ICD-427.31) Continue present medications for rate control and Coumadin with goal INR 2-3. History of TIA and will therefore need lifelong Coumadin. His updated medication list for this problem includes:    Metoprolol Tartrate 50 Mg Tabs (Metoprolol tartrate) .Marland Kitchen... Take one tablet and one half by mouth twice a day    Warfarin Sodium 5 Mg Tabs (Warfarin sodium) ..... Use as directed by anticoagulation clinic  Patient Instructions: 1)  Your physician recommends that you return for lab work ZO:XWRU FASTING 2)  Your physician recommends that you schedule a follow-up appointment in:

## 2010-04-21 LAB — COMPREHENSIVE METABOLIC PANEL
ALT: 18 U/L (ref 0–53)
AST: 23 U/L (ref 0–37)
Albumin: 3.9 g/dL (ref 3.5–5.2)
Alkaline Phosphatase: 83 U/L (ref 39–117)
Calcium: 9.6 mg/dL (ref 8.4–10.5)
GFR calc Af Amer: >60 mL/min (ref >60)
Potassium: 3.6 mEq/L (ref 3.5–5.1)
Sodium: 139 mEq/L (ref 135–145)
Total Protein: 7.1 g/dL (ref 6.0–8.3)

## 2010-04-21 LAB — POCT I-STAT, CHEM 8
BUN: 7 mg/dL (ref 6–23)
Calcium, Ion: 1.15 mmol/L (ref 1.12–1.32)
Chloride: 105 mEq/L (ref 96–112)
Glucose, Bld: 110 mg/dL — ABNORMAL HIGH (ref 70–99)
Potassium: 3.3 mEq/L — ABNORMAL LOW (ref 3.5–5.1)

## 2010-04-21 LAB — URINE MICROSCOPIC-ADD ON

## 2010-04-21 LAB — CBC
HCT: 42.9 % (ref 39.0–52.0)
Platelets: 400 10*3/uL (ref 150–400)
RDW: 12.3 % (ref 11.5–15.5)
WBC: 10.1 10*3/uL (ref 4.0–10.5)

## 2010-04-21 LAB — URINALYSIS, ROUTINE W REFLEX MICROSCOPIC
Hgb urine dipstick: NEGATIVE
Ketones, ur: >80 mg/dL — AB
Nitrite: NEGATIVE
pH: 6 (ref 5.0–8.0)

## 2010-04-21 LAB — DIFFERENTIAL
Basophils Relative: 0 % (ref 0–1)
Eosinophils Absolute: 0 10*3/uL (ref 0.0–0.7)
Lymphs Abs: 2.1 10*3/uL (ref 0.7–4.0)
Monocytes Absolute: 0.6 10*3/uL (ref 0.1–1.0)
Monocytes Relative: 6 % (ref 3–12)

## 2010-04-21 NOTE — Progress Notes (Signed)
Summary: need dx lab   Phone Note Call from Patient   Reason for Call: Referral Complaint: Breathing Problems Summary of Call: pt coming in tomorrow for liver/lipid lab-per ov on 2-28-need dx? Initial call taken by: Glynda Jaeger,  April 14, 2010 1:40 PM  Follow-up for Phone Call        04/14/10--1410pm--pt calling stating he needs lab work done tomorrow and he was supposed to call and let us know when he was coming--advised i will put order in computer for lab work for 04/15/10 Follow-up by: Ledon Snare, RN,  April 14, 2010 2:09 PM

## 2010-04-21 NOTE — Letter (Signed)
Summary: [facility] - Lipid  [facility], Main Office  [address]   [place], [place] [address]   Phone: [phone]  Fax: [phone]     April 15, 2010 MRN: [id number]   [name] [address] [place], [place]  [address]   Dear Mr. Mcinturff,  We have reviewed your cholesterol results.  They are as follows:     Total Cholesterol:    197 (Desirable: less than 200)       HDL  Cholesterol:     40.50  (Desirable: greater than 40 for men and 50 for women)       LDL Cholesterol:       136  (Desirable: less than 100 for low risk and less than 70 for moderate to high risk)       Triglycerides:       103.0  (Desirable: less than 150)  Our recommendations include:These numbers look good. Continue on the same medicine. Liver function is normal. Take care, Dr. Darel Hong.    Call our office at the number listed above if you have any questions.  Lowering your LDL cholesterol is important, but it is only one of a large number of "risk factors" that may indicate that you are at risk for heart disease, stroke or other complications of hardening of the arteries.  Other risk factors include:   A.  Cigarette Smoking* B.  High Blood Pressure* C.  Obesity* D.   Low HDL Cholesterol (see yours above)* E.   Diabetes Mellitus (higher risk if your is uncontrolled) F.  Family history of premature heart disease G.  Previous history of stroke or cardiovascular disease    *These are risk factors YOU HAVE CONTROL OVER.  For more information, visit .  There is now evidence that lowering the TOTAL CHOLESTEROL AND LDL CHOLESTEROL can reduce the risk of heart disease.  The American Heart Association recommends the following guidelines for the treatment of elevated cholesterol:  1.  If there is now current heart disease and less than two risk factors, TOTAL CHOLESTEROL should be less than 200 and LDL CHOLESTEROL should be less than 100. 2.  If there is current heart disease  or two or more risk factors, TOTAL CHOLESTEROL should be less than 200 and LDL CHOLESTEROL should be less than 70.  A diet low in cholesterol, saturated fat, and calories is the cornerstone of treatment for elevated cholesterol.  Cessation of smoking and exercise are also important in the management of elevated cholesterol and preventing vascular disease.  Studies have shown that 30 to 60 minutes of physical activity most days can help lower blood pressure, lower cholesterol, and keep your weight at a healthy level.  Drug therapy is used when cholesterol levels do not respond to therapeutic lifestyle changes (smoking cessation, diet, and exercise) and remains unacceptably high.  If medication is started, it is important to have you levels checked periodically to evaluate the need for further treatment options.  Thank you,    [employer] Team

## 2010-04-28 NOTE — Medication Information (Signed)
Summary: rov/sp  Anticoagulant Therapy  Managed by: Weston Brass, PharmD Referring MD: Olga Millers MD PCP: Etta Grandchild MD Supervising MD: Ladona Ridgel MD, Sharlot Gowda Indication 1: Atrial Fibrillation (ICD-427.31) Indication 2: Transcient Ischemic Attack Lab Used: [facility] Site: [name] INR POC 2.7 INR RANGE 2-3  Dietary changes: no    Health status changes: no    Bleeding/hemorrhagic complications: no    Recent/future hospitalizations: no    Any changes in medication regimen? no    Recent/future dental: no  Any missed doses?: no       Is patient compliant with meds? yes       Allergies: 1)  ! Aspirin  Anticoagulation Management History:      The patient is taking warfarin and comes in today for a routine follow up visit.  Positive risk factors for bleeding include history of CVA/TIA.  Negative risk factors for bleeding include an age less than 80 years old.  The bleeding index is 'intermediate risk'.  Positive CHADS2 values include History of HTN and Prior Stroke/CVA/TIA.  Negative CHADS2 values include Age > 65 years old and History of Diabetes.  The start date was 05/23/2007.  His last INR was 5.3 RATIO.  Anticoagulation responsible provider: Ladona Ridgel MD, Sharlot Gowda.  INR POC: 2.7.  Cuvette Lot#: [id number].  Exp: 02/2011.    Anticoagulation Management Assessment/Plan:      The patient's current anticoagulation dose is Warfarin sodium 5 mg tabs: Use as directed by Anticoagulation Clinic.  The target INR is 2.0-3.0.  The next INR is due 04/30/2010.  Anticoagulation instructions were given to patient.  Results were reviewed/authorized by Weston Brass, PharmD.  He was notified by Margot Chimes PharmD Candidate.         Prior Anticoagulation Instructions: INR 2.4  Restart your "pre-hospital" Coumadin dose of 1 tablet everyday except 1 and 1/2 tablets on Sundays and Thursdays.  Recheck in 2 weeks.    Current Anticoagulation Instructions: INR 2.7  Continue to take 1 tablet  everyday except for Sundays and Thursdays when you take 1 and 1/2 tablets.  Recheck INR in 4 weeks.

## 2010-04-30 ENCOUNTER — Ambulatory Visit (INDEPENDENT_AMBULATORY_CARE_PROVIDER_SITE_OTHER): Payer: Self-pay | Admitting: *Deleted

## 2010-04-30 DIAGNOSIS — I4891 Unspecified atrial fibrillation: Secondary | ICD-10-CM

## 2010-04-30 DIAGNOSIS — Z7901 Long term (current) use of anticoagulants: Secondary | ICD-10-CM

## 2010-04-30 NOTE — Patient Instructions (Signed)
Take 2 tablets today, then start taking 1 tablet daily except 1.5 tablets on Sundays, Tuesdays, and Thursdays.  Recheck in 3 weeks.

## 2010-05-03 LAB — PROTIME-INR
INR: 0.96 (ref 0.00–1.49)
Prothrombin Time: 12.7 s (ref 11.6–15.2)

## 2010-05-03 LAB — CBC
HCT: 40.6 % (ref 39.0–52.0)
Hemoglobin: 13.6 g/dL (ref 13.0–17.0)
MCHC: 33.4 g/dL (ref 30.0–36.0)
MCV: 88.9 fL (ref 78.0–100.0)
Platelets: 247 K/uL (ref 150–400)
RBC: 4.57 MIL/uL (ref 4.22–5.81)
RDW: 14.9 % (ref 11.5–15.5)
WBC: 11.1 10*3/uL — ABNORMAL HIGH (ref 4.0–10.5)

## 2010-05-03 LAB — POCT CARDIAC MARKERS
CKMB, poc: 3.2 ng/mL (ref 1.0–8.0)
CKMB, poc: 3.2 ng/mL (ref 1.0–8.0)
Troponin i, poc: <0.05 ng/mL (ref 0.00–0.09)
Troponin i, poc: <0.05 ng/mL (ref 0.00–0.09)

## 2010-05-03 LAB — BASIC METABOLIC PANEL
CO2: 24 mEq/L (ref 19–32)
Calcium: 9 mg/dL (ref 8.4–10.5)
Creatinine, Ser: 1.06 mg/dL (ref 0.4–1.5)
GFR calc Af Amer: >60 mL/min (ref >60)
Sodium: 138 mEq/L (ref 135–145)

## 2010-05-03 LAB — BASIC METABOLIC PANEL WITH GFR
BUN: 10 mg/dL (ref 6–23)
Chloride: 107 meq/L (ref 96–112)
GFR calc non Af Amer: >60 mL/min (ref >60)
Glucose, Bld: 107 mg/dL — ABNORMAL HIGH (ref 70–99)
Potassium: 3.7 meq/L (ref 3.5–5.1)

## 2010-05-03 LAB — APTT: aPTT: 27 s (ref 24–37)

## 2010-05-03 LAB — DIFFERENTIAL
Basophils Absolute: 0 10*3/uL (ref 0.0–0.1)
Basophils Relative: 0 % (ref 0–1)
Eosinophils Absolute: 0.1 K/uL (ref 0.0–0.7)
Eosinophils Relative: 1 % (ref 0–5)
Lymphocytes Relative: 20 % (ref 12–46)
Lymphs Abs: 2.2 K/uL (ref 0.7–4.0)
Monocytes Absolute: 0.4 10*3/uL (ref 0.1–1.0)
Monocytes Relative: 4 % (ref 3–12)
Neutro Abs: 8.3 K/uL — ABNORMAL HIGH (ref 1.7–7.7)
Neutrophils Relative %: 75 % (ref 43–77)

## 2010-05-03 LAB — BRAIN NATRIURETIC PEPTIDE: Pro B Natriuretic peptide (BNP): 330 pg/mL — ABNORMAL HIGH (ref 0.0–100.0)

## 2010-05-21 ENCOUNTER — Ambulatory Visit (INDEPENDENT_AMBULATORY_CARE_PROVIDER_SITE_OTHER): Payer: Self-pay | Admitting: *Deleted

## 2010-05-21 DIAGNOSIS — Z7901 Long term (current) use of anticoagulants: Secondary | ICD-10-CM

## 2010-05-21 DIAGNOSIS — I4891 Unspecified atrial fibrillation: Secondary | ICD-10-CM

## 2010-06-03 ENCOUNTER — Other Ambulatory Visit: Payer: Self-pay | Admitting: Internal Medicine

## 2010-06-06 ENCOUNTER — Other Ambulatory Visit: Payer: Self-pay | Admitting: Internal Medicine

## 2010-06-11 ENCOUNTER — Ambulatory Visit (INDEPENDENT_AMBULATORY_CARE_PROVIDER_SITE_OTHER): Payer: Self-pay | Admitting: *Deleted

## 2010-06-11 DIAGNOSIS — I4891 Unspecified atrial fibrillation: Secondary | ICD-10-CM

## 2010-06-23 NOTE — Discharge Summary (Signed)
NAMEEDUARD, [name]             ACCOUNT NO.:  [id number]   MEDICAL RECORD NO.:  [id number]          PATIENT TYPE:  INP   LOCATION:  3743                         FACILITY:  [facility]   PHYSICIAN:  Madolyn Frieze. Jens Som, MD, FACCDATE OF BIRTH:  05/09/1950   DATE OF ADMISSION:  05/25/2007  DATE OF DISCHARGE:  05/29/2007                               DISCHARGE SUMMARY   DISCHARGE DIAGNOSES:  1. Atrial fibrillation with rapid ventricular response.  2. 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  3. Poorly-controlled hypertension.  4. History of amaurosis fugax in the left eye approximately 6 months      ago.  5. Hyperlipidemia.  6. Initiation of Coumadin anticoagulation.  7. Remote tobacco abuse.   ALLERGIES:  ASPIRIN has caused nausea and vomiting in the past.   PROCEDURES:  1. Two-dimensional echocardiogram revealing normal left ventricular      function with an ejection fraction of 65% and mildly dilated left      atrium.  2. CT of the head with contrast showing chronic, ischemic, white      matter disease as well as scattered areas of more focal low      attenuation which could represent subacute infarctions.  There is a      left pontine lacunar infarct of undetermined age.  3. MRA of the head and neck showed no acute or subacute insult with      small vessel disease effecting the hemispheric white matter and an      old lacunar stroke of the left pons.  There was no stenotic or      occlusive disease in the neck with mild narrowing of the M1 segment      on the left, but this did not appear to be flow-limiting.   HISTORY OF PRESENT ILLNESS:  A 60 year old, Caucasian male without prior  cardiac history who, approximately 6 months, noted a curtain-like effect  over the left eye resolving spontaneously.  He sought no followup.  He  otherwise had been doing well until approximately 2 weeks prior to  admission when he noted sudden onset of right eye  blurring with  progressive vision loss on the right.  He also noted some right hand  weakness.  He saw Dr. Dione Booze on April 16, and there was concern over a  stroke and the patient was sent to Urgent Care where he was noted to be  in atrial fibrillation with rapid ventricular response.  He has been  sent along to the ED.  He was asymptomatic with regards to his atrial  fibrillation.  He was admitted for further evaluation, anticoagulation  and neurologic workup.   HOSPITAL COURSE:  CT of the head was performed and showed no acute  abnormalities with chronic small vessel disease and left pontine lacunar  infarct.  Neurology was consulted and they recommended.  Carotid  ultrasound which showed no significant internal carotid artery stenosis  or plaquing.  MRI/MRA showed old lacunar infarct as well as small vessel  disease, but was otherwise unrevealing as noted above.  The patient was  maintained on heparin and subsequently switched to Lovenox and also has  been on Coumadin therapy and tolerating this well.  At the  recommendation of neurology, enteric coated aspirin has also been  initiated.  We have placed him on beta-blocker, calcium channel blocker  and ACE inhibitor therapy for management of his hypertension and he has  tolerated this medications well.  With an LDL of 181, we have also  initiated statin therapy.  We have arranged for Mr. Lightsey to receive  Lovenox through our pharmacy at the time of discharge and will have him  followup in our Coumadin clinic tomorrow morning, April 21, at 11:30  a.m.  Mr. Hanrahan had received education with regards to Coumadin and  Lovenox therapy and will be discharged home today in good condition.  He  is to follow up with Dr. Jens Som on May 13, and we will also arrange  for an outpatient Myoview to rule out coronary ischemia.   DISCHARGE LABORATORY DATA AND X-RAY FINDINGS:  Hemoglobin 15.3,  hematocrit 45.0, WBC 8.4, platelets 300.  INR 1.8.   Sodium 137,  potassium 3.6, chloride 103, CO2 25, BUN 10, creatinine 1.09, glucose  87.  Total bilirubin 1.1, Alk phos 65, AST 18, ALT 21, total protein  7.4, albumin 4.2, calcium 9.5, magnesium 2.2.  Hemoglobin A1c 5.5.  CK  116, MB 2.3, troponin I less than 0.01.  BNP 230.  D-dimer 0.27.  Total  cholesterol 231, triglycerides 87, HDL 33, LDL 181.  TSH 0.853.  Homocystine 11.4.   DISPOSITION:  The patient is being discharged home today in good  condition.   FOLLOW UP:  We have arranged for followup in Coumadin clinic tomorrow  morning, April 21, at 11:30 a.m.  He is to follow up with Dr. Jens Som  on May 13, at 11 a.m.   DISCHARGE MEDICATIONS:  1. Enteric coated aspirin 81 mg daily.  2. Lovenox 100 mg q.12 h. until INR therapeutic.  3. Lopressor 25 mg b.i.d.  4. Simvastatin 40 mg nightly.  5. Lisinopril 20 mg daily.  6. Norvasc 10 mg daily.  7. Coumadin 10 mg tonight and then as directed by our Coumadin clinic.   OUTSTANDING LAB STUDIES:  None.   Duration of discharge encounter 55 minutes including physician time.      Nicolasa Ducking, ANP      Madolyn Frieze. Jens Som, MD, [facility]  Electronically Signed    CB/MEDQ  D:  05/29/2007  T:  05/29/2007  Job:  [id number]   cc:   Jonita Albee, M.D.  Robert L. Dione Booze, M.D.

## 2010-06-23 NOTE — Assessment & Plan Note (Signed)
[facility]                            CARDIOLOGY OFFICE NOTE   NAME:Roher, Quadre                      MRN:          [id number]  DATE:12/11/2007                            DOB:          Oct 24, 1950    Mr. Daudelin is a pleasant gentleman who has atrial fibrillation and a  history of TIA associated with this.  He did have his TIA in April 2009  and was diagnosed atrial fibrillation at that time.  Note, his LV  function on an echocardiogram on May 26, 2007, showed normal LV  function.  The patient also had a nuclear study on June 02, 2007, that  showed no scar or ischemia.  He had carotid Dopplers that showed no  significant stenosis.  A TSH was also normal.  Since I last saw him, he  is not doing well.  He is having increased dyspnea on exertion.  This  has been a chronic issue, but worse since his stroke in April.  Note,  there is no orthopnea, PND, or pedal edema.  He has not had syncope.  He  also complains of some fatigue.  He is also having cough at times that  is nonproductive.  He is also complaining of arthralgias.  He has also  had brief chest pain for 1-2 seconds that is not related to exertion.  It is not pleuritic or positional nor is it related to food.  There are  no associated symptoms.   His present medications include:  1. Aspirin 81 mg p.o. daily.  2. Lopressor 25 mg p.o. b.i.d.  3. Zocor 40 mg p.o. daily.  4. Lisinopril 20 mg p.o. daily.  5. Norvasc 10 mg p.o. daily.  6. Coumadin as directed.   PHYSICAL EXAMINATION:  GENERAL:  Today shows a blood pressure of 130/80  and his pulse is 76.  Weighs 210 pounds.  HEENT:  Normal.  NECK:  Supple.  CHEST:  Clear.  CARDIOVASCULAR:  Irregular rhythm.  ABDOMEN:  No tenderness.  EXTREMITIES:  No edema.   His electrocardiogram shows a sinus rhythm at a rate of 86.  There are  no significant ST changes.   DIAGNOSIS:  1. Atrial fibrillation - the patient remains in atrial  fibrillation      and his rate is adequately controlled.  He will continue on his      Lopressor as well as his Coumadin.  His dyspnea maybe related to      chronic obstructive pulmonary disease, but certainly atrial      fibrillation could be contributing.  We discussed this in May when      I saw him last, and he declined cardioversion.  However, he would      like to proceed with this now.  Note, he quit taking his Coumadin      for 3 days approximately 1-1/2 weeks ago due to a viral illness and      nausea/vomiting.  We will therefore plan to proceed with      cardioversion after his INR has been therapeutic 4 straight weeks  from today.  I have explained that the cardioversion may not be      successful given the duration of his arrhythmia.  If he is not      successful, then we will need to schedule him to have a monitor to      make sure that his rate is controlled adequately over the extended      amount of time.  2. Dyspnea - as per above.  This may be related to chronic obstructive      pulmonary disease versus atrial fibrillation.  We will plan as      described in #1.  3. Hypertension - his blood pressure is adequately controlled.      However, he is complaining of a cough.  I will discontinue his      lisinopril, and we will instead treat with Cozaar 50 mg p.o. daily.  4. Hyperlipidemia - he will start on Zocor, but he is complaining of      arthralgias.  We will discontinue Zocor.  If his symptoms improve,      then we may try Pravachol in the future.  5. Tobacco abuse - discussed the importance of discontinuing this for      between 3-10 minutes.  6. Coumadin therapy - this is being managed in our Coumadin Clinic      with goal INR of 2-3.   I will see him back in 6 weeks.  In the meantime, we will schedule his  cardioversion once he has been therapeutic.     Madolyn Frieze Jens Som, MD, [facility]  Electronically Signed    BSC/MedQ  DD: 12/11/2007  DT: 12/11/2007  Job #:  [id number]   cc:   Jonita Albee, M.D.

## 2010-06-23 NOTE — H&P (Signed)
[name], [name]             ACCOUNT NO.:  [id number]   MEDICAL RECORD NO.:  [id number]          PATIENT TYPE:  INP   LOCATION:  1832                         FACILITY:  [facility]   PHYSICIAN:  [name]. [name] Som, MD, FACCDATE OF BIRTH:  19-Jan-1951   DATE OF ADMISSION:  05/25/2007  DATE OF DISCHARGE:                              HISTORY & PHYSICAL   PRIMARY CARDIOLOGIST:  New to [facility], being seen by Dr.  Jens [name].   PRIMARY CARE [name]:  Dr. Perrin [name].   OPHTHALMOLOGIST:  [name]. [name], M.D.   PATIENT PROFILE:  A 60 year old Caucasian male without prior cardiac  history who presents to the ED secondary to a 2 week history of right  eye vision loss in the setting of a new diagnosis of atrial  fibrillation.   PROBLEMS:  1. Atrial fibrillation.  2. Poorly controlled hypertension.  3. Questionable history of TIA with amaurosis fugax of the left eye 6      months ago.  4. Right eye vision loss x2 weeks.  5. Remote tobacco abuse with 100 pack year history, quitting in the      past couple of months.   HISTORY OF PRESENT ILLNESS:  A 60 year old Caucasian male without prior  cardiac history.  Approximately 6 months ago he noted a curtain like  effect over the left eye resolving spontaneously.  He did not seek  treatment.  He otherwise had been doing well until approximately 2 weeks  ago when he had sudden blurring of vision in the right eye.  Since then  his vision has progressively worsened and he now has no vision in the  right eye and also notes right hand weakness.  Because of his loss of  vision in the right eye he presented to Dr. Dione [name] today for evaluation.  There was concern over a stroke and atrial fibrillation and the patient  was sent to urgent care.  From there he was sent to the ED.  He is  asymptomatic with the exception of right eye vision loss.  He is in  atrial fibrillation with rates anywhere from 90 to 110.  He denies any  prior history of chest  discomfort either at rest or with exertion.  He  denies any palpitations.  He does have occasional dyspnea on exertion  while at work.  He denies any PND, orthopnea, dizziness, syncope, edema  or early satiety.   ALLERGIES:  ASPIRIN causes nausea and vomiting.   HOME MEDICATIONS:  He takes a [employer] pain reliever.   FAMILY HISTORY:  Mother is alive at age 36 with hypertension.  Father  died of 70 with gastric cancer.  He has a sister who is alive and well.   SOCIAL HISTORY:  He lives in [place] with his mother.  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  He is a 100 pack year history of tobacco abuse  quitting 2-3 months ago.  He denies any alcohol or drugs and does not  routinely exercise.   REVIEW OF SYSTEMS:  Positive for fatigue over the past couple of weeks,  right eye vision loss as  outlined in the HPI, amaurosis fugax  to the  left eye about 6 months ago.  Otherwise all systems reviewed and  negative.   PHYSICAL EXAM:  VITAL SIGNS:  Temperature 96.6, heart rate 70,  respirations 18, blood pressure 180/134 down to 180/120.  GENERAL:  A pleasant white male in no acute distress.  Awake and alert  and oriented x3.  HEENT:  Normal with exception of poor dentition.  NEUROLOGICAL:  Grossly intact.  Nonfocal with the exception of right eye  vision loss.  SKIN:  Skin is warm and dry without lesions or masses.  NECK:  No bruits, JVD.  LUNGS:  Respirations regular and unlabored, clear to auscultation.  CARDIAC:  Irregularly irregular, S1, S2.  No murmurs.  ABDOMEN:  Round, soft, nontender, nondistended.  Bowel sounds present  x4.  EXTREMITIES:  Warm and dry and pink.  No clubbing, cyanosis or edema.  Dorsalis pedis and posterior tibial pulses 2+ bilaterally.  Sensory  without focal findings.   Chest x-ray and lab work is pending.  EKG shows atrial fibrillation with  poor R-wave progression, rate of 93 beats per minute.   ASSESSMENT AND PLAN:  1. Atrial fibrillation.  Question  duration.  The patient is      asymptomatic from an atrial fibrillation standpoint, although he      does have dyspnea on exertion which may be related to increased      heart rates with activity.  It is not clear.  We will watch upon      telemetry.  Plan to admit and cycle cardiac markers.  Will check 2-      D echocardiogram as well as TSH, electrolytes and magnesium.  Once      we have checked a head CT and gotten his blood pressure down we      will plan on heparin anticoagulation along with Coumadin.  Of note,      the patient is reluctant to be admitted at all, however, is      agreeing to at least tonight so that we can get these necessary      tests performed and anticoagulation initiated.  2. Hypertension.  He has no prior diagnosis.  We will start with      intravenous beta-blocker and then oral beta-blocker as well as ACE      inhibitor.  3. Lipid status currently unknown.  Check lipids and LFTs.  Will go      head and add simvastatin in the setting of stroke.  4. Tobacco abuse, smoking cessation strongly advised.  5. Ocular stroke.  Will check a head CT as noted above.  Heparin and      Coumadin following CT.  Plan neuro consult in the a.m. or sooner if      the CT shows something acute.      Nicolasa Ducking, ANP      [name]. [name] Som, MD, [facility]  Electronically Signed    CB/MEDQ  D:  05/25/2007  T:  05/25/2007  Job:  [id number]

## 2010-06-23 NOTE — Consult Note (Signed)
[name], [name]             ACCOUNT NO.:  [id number]   MEDICAL RECORD NO.:  [id number]          [name] TYPE:  INP   LOCATION:  3731                         FACILITY:  [facility]   PHYSICIAN:  Santina Evans A. Orlin Hilding, M.D.DATE OF BIRTH:  1951-01-01   DATE OF CONSULTATION:  05/26/2007  DATE OF DISCHARGE:                                 CONSULTATION   CHIEF COMPLAINT:  Possible stroke on CT in a [name] with right eye  vision loss and new-onset atrial fibrillation.   HISTORY OF PRESENT ILLNESS:  [name] is a 56-year right-handed  white [name] with no history of medical care recently.  [name] sustained an  episode of amaurosis fugax in [name] left eye about 6 months ago with rapid  clearing.  About 2 weeks ago, [name] had sudden onset of partial visual loss  in [name] right eye with blurring centrally and inferiorly.  This was  progressing gradually, so [name] went to see an eye doctor, Dr. Dione Booze, who  according to [name] notes found a retinal vein occlusion and felt [name]  [name] needed a cardiac workup for source of embolus.  Dr. Laruth Bouchard  notes were not appended, and it is unclear to me if this was an arterial  retinal artery occlusion or venous occlusion.  Anyway, [name] was then seen  at Urgent Care and found to have atrial fibrillation and severe  hypertension.  [name] was sent to [facility] Emergency Room, where [name] was admitted  by Dr. Jens Som, and [name] was started on heparin and Coumadin.  A CT  showed nonspecific hypodensities, and I am asked to see [name] regarding  this.   REVIEW OF SYSTEMS:  A 12-system review was performed, and it was  positive for remote history of seizure, current vision changes, some  arthritic-type pains, [name] vision blurring in [name] right eye, reflux  symptoms, and some insomnia.   PAST MEDICAL HISTORY:  Largely unknown.  [name] generally does not  need any medical care.   MEDICATIONS:  [name] took nothing, but an over-[name]-counter pain reliever.   ALLERGIES:  No known drug allergies,  although ASPIRIN causes some GI  distress with nausea and vomiting.   SOCIAL HISTORY:  [name] lives with his mother in [place].  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  No alcohol.  No illicit drug use.  [name] is a heavy  smoker, although [name] said [name] quit just 3-4 months ago, smoking over 100  pack years.   FAMILY HISTORY:  Positive for hypertension.   OBJECTIVE:  VITAL SIGNS:  Temperature is 96.9, pulse 78, respirations  17, BP 133/90, and 97% O2 sat on room air.  HEENT:  Head is normocephalic, atraumatic.  On eye exam, I am unable to  clearly visualize [name] disc and retina on [name] right.  NECK:  Supple without bruits.  HEART:  Irregularly irregular.  NEUROLOGIC:  [name] is awake.  [name] is alert.  [name] is oriented to 2/2  questions.  Follows commands 2/2.  [name] has full gaze.  His visual fields  are actually intact.  Peripherally, [name] has a little central blurring,  but [name]  is able to do finger counting and detect motion in all four  quadrants.  [name] does not have a homonymous hemianopsia.  There is no  facial droop.  [name] has got very poor dentition.  No drift in either upper  extremities.  No drift in either lower extremities.  No ataxia.  No  sensory loss.  No aphasia.  No dysarthria.  No extinction.   CT of [name] brain shows subtle pons hypodensity, which may be a small  lacunar infarct as well as subcortical and periventricular hypodensities  of indeterminate age.   LABORATORY DATA:  Cholesterol was 231 with an LDL of 181.  PT was 13.3,  INR 1.0.  TSH 0.853.  BMET and CBC were normal.   IMPRESSION:  1. History of amaurosis fugax in [name] left eye 6 months ago.  2. New vision loss in [name] right eye 2 weeks ago.  It is unclear to me      if this was venous or arterial.  3. Newly diagnosed atrial fibrillation.  4. Newly diagnosed hypertension.  5. Abnormal CT with presumed ischemia of uncertain duration or      chronology.   RECOMMENDATIONS:  We will check carotid Doppler and homocysteine level.  We  will check MRI of [name] brain and MR angiogram of [name] head and neck.  Agree with Coumadin for his atrial fibrillation.  We will need to  consider low-dose aspirin 81 mg, (perhaps coated because of his  intolerance issues) for small-vessel prophylaxis, as well as [name]  Coumadin for his atrial fibrillation.  Needs risk factor modification.      Catherine A. Orlin Hilding, M.D.  Electronically Signed     CAW/MEDQ  D:  05/26/2007  T:  05/27/2007  Job:  [id number]

## 2010-06-23 NOTE — Assessment & Plan Note (Signed)
[facility]                            CARDIOLOGY OFFICE NOTE   NAME:Livsey, Laquon                      MRN:          [id number]  DATE:01/22/2008                            DOB:          1950/10/31    HISTORY OF PRESENT ILLNESS:  [name] is a gentleman with atrial  fibrillation and prior TIA associated with this.  His LV function is  normal, and he has had a previous nuclear study that showed no scar or  ischemia.  Since I last saw him he does have dyspnea on exertion, which  has been a chronic issue.  There is no orthopnea, PND, pedal edema,  palpitations, presyncope, syncope, or chest pain.  Note, his dyspnea on  exertion is unchanged compared to when I saw on December 11, 2007.  This  was associated with activities and relieved with rest.  It is not  associate with chest pain.  It lasts only for the time when he perform  his activities.  The patient is having some aches in his arms and legs.  He did not discontinue his Zocor as we talked about previously.  Also,  he is continue to have a cough, but did not change his lisinopril to  Cozaar due to cost.   MEDICATIONS:  1. Aspirin 81 mg p.o. daily.  2. Lopressor 25 mg p.o. b.i.d.  3. Zocor 40 mg p.o. daily.  4. Lisinopril 20 mg p.o. daily.  5. Norvasc 10 mg p.o. daily.  6. Coumadin as directed.   PHYSICAL EXAMINATION:  VITAL SIGNS:  His blood pressure 141/102, his  pulse of 87, and his weight is 222 pounds.  HEENT:  Normal.  NECK:  Supple.  CHEST:  Clear.  CARDIOVASCULAR:  Irregular rhythm.  ABDOMEN:  No tenderness.  EXTREMITIES:  No edema.   DIAGNOSES:  1. Atrial fibrillation - the patient remains in atrial fibrillation on      examination today.  We will continue with his Lopressor for rate      control as well as his Coumadin.  We did discuss cardioversion when      I saw him on December 11, 2007.  I am not clear whether his dyspnea      is related to his atrial fibrillation or to  chronic obstructive      pulmonary disease, has a longstanding tobacco abuse.  However, we      could try cardioversion to see if it improve his symptoms.  We will      await for his INR to be therapeutic 4 consecutive weeks and then we      will proceed.  The patient is in agreement with this.  If he does      not hold sinus rhythm, then he will need to monitor to make sure      that his rates adequately controlled over an extended amount of      time.  2. Dyspnea - this is most likely related to chronic obstructive      pulmonary disease, but atrial fibrillation may be contributing and  we will proceed as described in one.  3. Hypertension - his blood pressure is elevated today, but he has not      taken his medications.  He is complaining of cough, which is most      likely related to lisinopril.  He could not afford an angiotensin      receptor blocker.  I will discontinue his lisinopril and we will      treat with HCTZ 12.5 mg p.o. daily.  We will check a BMET in 1 week      to follow status and renal function.  We will adjust his      antihypertensive regimen as needed.  4. Hyperlipidemia - he has had some myalgias on Zocor.  I will      discontinue this.  If symptoms improve, then we will try Pravachol      in the future.  5. Tobacco abuse - we discussed importance of discontinuing this for      between 3-10 minutes.  6. Coumadin therapy - this is being monitored in our Coumadin Clinic.   I will see him back in 3 months.  In the meantime, we will await his  INRs for cardioversion.     Madolyn Frieze Jens Som, MD, [facility]  Electronically Signed    BSC/MedQ  DD: 01/22/2008  DT: 01/22/2008  Job #: [id number]   cc:   Jonita Albee, M.D.

## 2010-06-23 NOTE — Assessment & Plan Note (Signed)
[facility]                            CARDIOLOGY OFFICE NOTE   NAME:[name], [name]                      MRN:          [id number]  DATE:06/21/2007                            DOB:          Oct 17, 1950    [name] is a pleasant 60 year old gentleman that recently was  admitted to [facility] with amaurosis fugax.  He was found to  be in atrial fibrillation.  A CT of his head showed no acute  abnormalities, but there was chronic small vessel disease and a left  pontine lacunar infarct.  There was no significant carotid stenosis on  his carotid Dopplers.  The MRI/MRA showed an old lacunar infarct as well  and small vessel disease.  The patient had an echocardiogram performed  that showed an ejection fraction of 65% and mild left atrial  enlargement.  There was no significant valvular abnormalities noted.  He  was placed on rate controlling medications.  He was also placed on blood  pressure medications as his blood pressure was extremely high when he  came in.  He also was started on a statin for hyperlipidemia.  Note, the  TSH was normal.  Following discharge, the patient had a Myoview on June 02, 2007.  The ejection fraction was 51%, and there was no ischemia or  infarction.  Since I saw him, he does have some dyspnea on exertion, but  this has been present for approximately 1-year.  There is no orthopnea,  PND, palpitations, presyncope or syncope.  He has not had chest pain.  He occasionally has mild pedal edema.   PRESENT MEDICATIONS:  1. Aspirin 81 mg p.o. daily.  2. Lopressor 25 mg p.o. b.i.d.  3. Zocor 40 mg p.o. daily.  4. Lisinopril 20 mg p.o. daily.  5. Norvasc 10 mg p.o. daily.  6. Coumadin as directed.   PHYSICAL EXAMINATION:  VITAL SIGNS:  Today, shows a blood pressure of  125/80 and his pulse is 68.  He weighs 217 pounds.  HEENT:  Normal.  NECK:  Supple.  CHEST:  Clear.  CARDIOVASCULAR:  Reveals an irregular rhythm.  ABDOMEN:  Shows no tenderness.  EXTREMITIES:  Show no edema.   His electrocardiogram shows atrial fibrillation at a rate of 77.  There  are no ST changes noted.   DIAGNOSES:  1. Atrial fibrillation - I had a long discussion with [name] the      office today.  He does have some dyspnea on exertion.  He does have      a long history of tobacco use and certainly he could have lung      disease contributing to his dyspnea.  However, the atrial      fibrillation could also be contributing.  However, we discussed the      possibility of rate control, anticoagulation verses attempted      cardioversion.  He would like to continue with rate control and      anticoagulation.  I have explained that the longer he is in atrial      fibrillation the less likely we  will be able to potentially restore      sinus rhythm in the future.  He understands this.  We will continue      his Lopressor 25 mg p.o. b.i.d.  Will also continue on Coumadin      with a goal INR of 2-3, and will need this lifelong given his      history of atrial fibrillation and recent embolic event.  Note, his      LV function is normal and his TSH was normal as well.  2. Hypertension - His blood pressure is adequately controlled on his      present medications.  I will check a BMET to follow his potassium      and renal function.  3. Hyperlipidemia - We recently started Zocor and we will check lipids      and liver and adjust as indicated.  4. Tobacco abuse - He needs to discontinue this.  5. Coumadin use - This is being managed in our Coumadin Clinic.   I will see him back in 3 months.     Madolyn Frieze Jens Som, MD, [facility]  Electronically Signed    BSC/MedQ  DD: 06/21/2007  DT: 06/21/2007  Job #: [id number]   cc:   [name], M.D.  [name], M.D.

## 2010-06-24 ENCOUNTER — Encounter: Payer: Self-pay | Admitting: Cardiology

## 2010-06-30 ENCOUNTER — Ambulatory Visit (INDEPENDENT_AMBULATORY_CARE_PROVIDER_SITE_OTHER): Payer: Self-pay | Admitting: *Deleted

## 2010-06-30 ENCOUNTER — Encounter: Payer: Self-pay | Admitting: Cardiology

## 2010-06-30 ENCOUNTER — Ambulatory Visit (INDEPENDENT_AMBULATORY_CARE_PROVIDER_SITE_OTHER): Payer: Self-pay | Admitting: Cardiology

## 2010-06-30 DIAGNOSIS — I4891 Unspecified atrial fibrillation: Secondary | ICD-10-CM

## 2010-06-30 DIAGNOSIS — R0989 Other specified symptoms and signs involving the circulatory and respiratory systems: Secondary | ICD-10-CM | POA: Insufficient documentation

## 2010-06-30 DIAGNOSIS — I10 Essential (primary) hypertension: Secondary | ICD-10-CM

## 2010-06-30 MED ORDER — DILTIAZEM HCL ER 240 MG PO CP24: 240.0000 mg | ORAL_CAPSULE | Freq: Every day | ORAL | Status: DC

## 2010-06-30 NOTE — Patient Instructions (Signed)
Your physician has requested that you have an abdominal aorta duplex. During this test, an ultrasound is used to evaluate the aorta. Allow 30 minutes for this exam. Do not eat after midnight the day before and avoid carbonated beverages.  SAME DAY AS YOUR NEXT COUMADIN Appt. Your physician has recommended you make the following change in your medication: increase Diltiazem to 240mg  daily Your physician recommends that you schedule a follow-up appointment in: 6 months with Dr. Jens Som

## 2010-06-30 NOTE — Assessment & Plan Note (Signed)
Continue Cardizem but increase dose to 240 mg daily for improved rate control and for blood pressure. Continue Lopressor. Continue Coumadin given history of TIA.

## 2010-06-30 NOTE — Assessment & Plan Note (Signed)
Blood pressure mildly elevated. Increase Cardizem to 240 mg daily.

## 2010-06-30 NOTE — Assessment & Plan Note (Signed)
I think this is most likely secondary to both COPD and atrial fibrillation.

## 2010-06-30 NOTE — Progress Notes (Signed)
HPI: Pleasant male with past medical history of atrial fibrillation and prior TIA associated with this. In 2009 his LV function was normal and a previous nuclear study showed no scar or ischemia. Echocardiogram in June of 2011 showed biatrial enlargement. LV function was not mentioned. There was moderate mitral regurgitation. Patient was admitted in early February with atrial fibrillation with a rapid ventricular response. He also had congestive heart failure. However he had been taking none of his medications. These were resumed with improvement. PFTs revealed moderate obstructive disease. I last saw him in Feb 2012. Since then, he continues to have dyspnea on exertion but no orthopnea, PND, chest pain or syncope. Occasional minimal pedal edema. He continues to smoke.   Current Outpatient Prescriptions  Medication Sig Dispense Refill  . diltiazem (DILACOR XR) 180 MG 24 hr capsule Take 180 mg by mouth daily.        . metoprolol (LOPRESSOR) 50 MG tablet Take 75 mg by mouth 2 (two) times daily. Take one and one half tab twice a day       . traMADol (ULTRAM) 50 MG tablet Take 50 mg by mouth every 6 (six) hours as needed.        . warfarin (COUMADIN) 5 MG tablet Take by mouth as directed.        Marland Kitchen DISCONTD: pravastatin (PRAVACHOL) 40 MG tablet Take 40 mg by mouth at bedtime.           Past Medical History  Diagnosis Date  . Atrial fibrillation   . Hypertension   . Hyperlipidemia   . COPD (chronic obstructive pulmonary disease)   . Amaurosis fugax of left eye     history of     No past surgical history on file.  History   Social History  . Marital Status: Divorced    Spouse Name: N/A    Number of Children: N/A  . 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 Smoking status: Current Some Day Smoker  . Smokeless tobacco: Not on file  . Alcohol Use: No  . Drug Use: No  . Sexually Active: Not on file   Other Topics Concern  .  Not on file   Social History Narrative  . No narrative on file    ROS: no fevers or chills, productive cough, hemoptysis, dysphasia, odynophagia, melena, hematochezia, dysuria, hematuria, rash, seizure activity, orthopnea, PND, pedal edema, claudication. Remaining systems are negative.  Physical Exam: Well-developed well-nourished in no acute distress.  Skin is warm and dry.  HEENT is normal.  Neck is supple. No thyromegaly.  Chest is clear to auscultation with normal expansion.  Cardiovascular exam irregular Abdominal exam nontender or distended. No masses palpated. Positive bruit. Extremities show no edema. neuro grossly intact  ECG Atrial fibrillation at a rate of 72. No ST changes.

## 2010-06-30 NOTE — Assessment & Plan Note (Signed)
Schedule abdominal ultrasound to exclude aneurysm. 

## 2010-06-30 NOTE — Assessment & Plan Note (Signed)
Patient counseled on discontinuing. 

## 2010-07-02 ENCOUNTER — Encounter: Payer: Self-pay | Admitting: *Deleted

## 2010-07-27 ENCOUNTER — Telehealth: Payer: Self-pay | Admitting: Cardiology

## 2010-07-27 NOTE — Telephone Encounter (Signed)
Spoke with pt, he reports and episode of feeling bad on Friday,with increased SOB and elevated bp. bp on Friday 164/102 and his pulse was 103. He also reports he has been SOB and it is getting worse. He can have trouble feeling smothered when lying down but not everynight. He on has alittle edema in his left foot but reports that being normal. He reports being SOB when he raises his arms above his head. He states as long as he does nothing he feels fine otherwise is SOB. His cardizem was increased at the last office visit and wonders if that is the cause.bp today 148/80 and pulse 81. Will forward for dr Jens Som review Deliah Goody

## 2010-07-27 NOTE — Telephone Encounter (Signed)
Spoke with pt, he will contact us if he cont to have problems Deliah Goody

## 2010-07-27 NOTE — Telephone Encounter (Signed)
Would continue same. If dyspnea persists, schedule fu ov [name]

## 2010-07-27 NOTE — Telephone Encounter (Signed)
Per pt calling c/o sob, nervous on Friday am. Tingling in hands. B/p medication was changed.

## 2010-07-31 ENCOUNTER — Encounter (INDEPENDENT_AMBULATORY_CARE_PROVIDER_SITE_OTHER): Payer: Self-pay | Admitting: Cardiology

## 2010-07-31 ENCOUNTER — Encounter: Payer: Self-pay | Admitting: Cardiology

## 2010-07-31 ENCOUNTER — Ambulatory Visit (INDEPENDENT_AMBULATORY_CARE_PROVIDER_SITE_OTHER): Payer: Self-pay | Admitting: *Deleted

## 2010-07-31 DIAGNOSIS — I4891 Unspecified atrial fibrillation: Secondary | ICD-10-CM

## 2010-07-31 DIAGNOSIS — R0989 Other specified symptoms and signs involving the circulatory and respiratory systems: Secondary | ICD-10-CM

## 2010-07-31 LAB — POCT INR: INR: 1.5

## 2010-08-14 ENCOUNTER — Encounter: Payer: Self-pay | Admitting: *Deleted

## 2010-09-16 ENCOUNTER — Other Ambulatory Visit: Payer: Self-pay | Admitting: Cardiovascular Disease

## 2010-10-21 ENCOUNTER — Ambulatory Visit (INDEPENDENT_AMBULATORY_CARE_PROVIDER_SITE_OTHER): Payer: Self-pay | Admitting: *Deleted

## 2010-10-21 DIAGNOSIS — I4891 Unspecified atrial fibrillation: Secondary | ICD-10-CM

## 2010-11-03 LAB — CBC
HCT: 43.7
Hemoglobin: 15.3
Hemoglobin: 15.6
MCHC: 34
MCHC: 34.9
MCV: 90.9
Platelets: 298
RBC: 4.79
RBC: 4.94
RBC: 4.97
WBC: 9.1
WBC: 9.7

## 2010-11-03 LAB — COMPREHENSIVE METABOLIC PANEL
ALT: 21
AST: 18
Albumin: 4.2
Alkaline Phosphatase: 65
GFR calc Af Amer: >60
Glucose, Bld: 87
Potassium: 3.6
Sodium: 137
Total Protein: 7.4

## 2010-11-03 LAB — TROPONIN I: Troponin I: 0.01

## 2010-11-03 LAB — DIFFERENTIAL
Basophils Relative: 0
Eosinophils Absolute: 0.1
Eosinophils Relative: 1
Lymphs Abs: 4.4 — ABNORMAL HIGH
Monocytes Absolute: 0.5
Monocytes Relative: 6
Neutrophils Relative %: 48

## 2010-11-03 LAB — HEPARIN LEVEL (UNFRACTIONATED): Heparin Unfractionated: 0.26 — ABNORMAL LOW

## 2010-11-03 LAB — CK TOTAL AND CKMB (NOT AT ARMC): CK, MB: 3.5

## 2010-11-03 LAB — LIPID PANEL
Cholesterol: 231 — ABNORMAL HIGH
HDL: 33 — ABNORMAL LOW
HDL: 35 — ABNORMAL LOW
LDL Cholesterol: 159 — ABNORMAL HIGH
LDL Cholesterol: 181 — ABNORMAL HIGH
Total CHOL/HDL Ratio: 6.1
Triglycerides: 88
VLDL: 18

## 2010-11-03 LAB — CARDIAC PANEL(CRET KIN+CKTOT+MB+TROPI)
CK, MB: 2.3
CK, MB: 2.8
Total CK: 116
Total CK: 125
Troponin I: <0.01

## 2010-11-03 LAB — PROTIME-INR: INR: 1

## 2010-11-03 LAB — APTT: aPTT: 29

## 2010-11-03 LAB — B-NATRIURETIC PEPTIDE (CONVERTED LAB): Pro B Natriuretic peptide (BNP): 230 — ABNORMAL HIGH

## 2010-11-03 LAB — TSH: TSH: 0.853

## 2010-11-09 LAB — DIFFERENTIAL
Eosinophils Absolute: 0
Eosinophils Relative: 0
Lymphs Abs: 2.1
Monocytes Relative: 10
Neutrophils Relative %: 58

## 2010-11-09 LAB — URINALYSIS, ROUTINE W REFLEX MICROSCOPIC
Glucose, UA: NEGATIVE
Ketones, ur: 40 — AB
Protein, ur: 100 — AB

## 2010-11-09 LAB — URINE MICROSCOPIC-ADD ON

## 2010-11-09 LAB — COMPREHENSIVE METABOLIC PANEL
ALT: 23
Albumin: 3.9
Alkaline Phosphatase: 58
Glucose, Bld: 99
Potassium: 3.3 — ABNORMAL LOW
Sodium: 137
Total Protein: 7.2

## 2010-11-09 LAB — APTT: aPTT: 29

## 2010-11-09 LAB — CBC
HCT: 48.8
Hemoglobin: 16.3
MCV: 91.5
RBC: 5.34
WBC: 6.8

## 2010-11-09 LAB — POCT I-STAT, CHEM 8
BUN: 17
Calcium, Ion: 1.1 — ABNORMAL LOW
Creatinine, Ser: 1.4
Glucose, Bld: 105 — ABNORMAL HIGH
TCO2: 26

## 2010-11-09 LAB — GLUCOSE, CAPILLARY: Glucose-Capillary: 100 — ABNORMAL HIGH

## 2010-11-09 LAB — PROTIME-INR: INR: 1

## 2010-11-12 ENCOUNTER — Other Ambulatory Visit: Payer: Self-pay | Admitting: Cardiovascular Disease

## 2010-11-18 ENCOUNTER — Ambulatory Visit (INDEPENDENT_AMBULATORY_CARE_PROVIDER_SITE_OTHER): Payer: Self-pay | Admitting: *Deleted

## 2010-11-18 DIAGNOSIS — I4891 Unspecified atrial fibrillation: Secondary | ICD-10-CM

## 2010-12-28 ENCOUNTER — Ambulatory Visit: Payer: Self-pay | Admitting: Cardiology

## 2010-12-28 ENCOUNTER — Ambulatory Visit (INDEPENDENT_AMBULATORY_CARE_PROVIDER_SITE_OTHER): Payer: Self-pay | Admitting: *Deleted

## 2010-12-28 ENCOUNTER — Ambulatory Visit (INDEPENDENT_AMBULATORY_CARE_PROVIDER_SITE_OTHER): Payer: Self-pay | Admitting: Cardiology

## 2010-12-28 ENCOUNTER — Encounter: Payer: Self-pay | Admitting: Cardiology

## 2010-12-28 VITALS — BP 144/72 | HR 82 | Ht 71.0 in | Wt 215.8 lb

## 2010-12-28 DIAGNOSIS — I10 Essential (primary) hypertension: Secondary | ICD-10-CM

## 2010-12-28 DIAGNOSIS — Z7901 Long term (current) use of anticoagulants: Secondary | ICD-10-CM

## 2010-12-28 DIAGNOSIS — I4891 Unspecified atrial fibrillation: Secondary | ICD-10-CM

## 2010-12-28 DIAGNOSIS — E7800 Pure hypercholesterolemia, unspecified: Secondary | ICD-10-CM

## 2010-12-28 DIAGNOSIS — F17200 Nicotine dependence, unspecified, uncomplicated: Secondary | ICD-10-CM

## 2010-12-28 NOTE — Assessment & Plan Note (Signed)
Continue statin. 

## 2010-12-28 NOTE — Patient Instructions (Signed)
Your physician wants you to follow-up in: ONE YEAR You will receive a reminder letter in the mail two months in advance. If you don't receive a letter, please call our office to schedule the follow-up appointment.  Your physician has requested that you have an echocardiogram. Echocardiography is a painless test that uses sound waves to create images of your heart. It provides your doctor with information about the size and shape of your heart and how well your heart's chambers and valves are working. This procedure takes approximately one hour. There are no restrictions for this procedure.   Your physician has recommended that you wear a 24 HOUR holter monitor. Holter monitors are medical devices that record the heart's electrical activity. Doctors most often use these monitors to diagnose arrhythmias. Arrhythmias are problems with the speed or rhythm of the heartbeat. The monitor is a small, portable device. You can wear one while you do your normal daily activities. This is usually used to diagnose what is causing palpitations/syncope (passing out).    

## 2010-12-28 NOTE — Progress Notes (Signed)
Addended by: Burnett Kanaris A on: 12/28/2010 12:13 PM   Modules accepted: Orders

## 2010-12-28 NOTE — Progress Notes (Signed)
JWJ:[id number] male with past medical history of atrial fibrillation and prior TIA associated with this. In 2009 his LV function was normal and a previous nuclear study showed no scar or ischemia. Echocardiogram in June of 2011 showed biatrial enlargement. LV function was not mentioned. There was moderate mitral regurgitation. H/O intermittent noncompliance. Previous PFTs revealed moderate obstructive disease. Abdominal ultrasound in June of 2012 showed no anneurysm. I last saw him in May 2012. Since then, he continues to have dyspnea on exertion but no orthopnea, PND, chest pain or syncope. Occasional minimal pedal edema. He continues to smoke.    Current Outpatient Prescriptions  Medication Sig Dispense Refill  . diltiazem (DILACOR XR) 240 MG 24 hr capsule Take 1 capsule (240 mg total) by mouth daily.  30 capsule  11  . metoprolol (LOPRESSOR) 50 MG tablet Take 75 mg by mouth 2 (two) times daily. Take one and one half tab twice a day       . pravastatin (PRAVACHOL) 40 MG tablet TAKE ONE TABLET BY MOUTH AT BEDTIME  30 tablet  6  . warfarin (COUMADIN) 5 MG tablet Take as directed by Anticoagulation clinic  45 tablet  1     Past Medical History  Diagnosis Date  . Atrial fibrillation   . Hypertension   . Hyperlipidemia   . COPD (chronic obstructive pulmonary disease)   . Amaurosis fugax of left eye     history of     No past surgical history on file.  History   Social History  . Marital Status: Divorced    Spouse Name: N/A    Number of Children: N/A  . 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 Smoking status: Current Some Day Smoker  . Smokeless tobacco: Not on file  . Alcohol Use: No  . Drug Use: No  . Sexually Active: Not on file   Other Topics Concern  . Not on file   Social History Narrative  . No narrative on file    ROS: no fevers or chills, productive cough, hemoptysis, dysphasia, odynophagia, melena,  hematochezia, dysuria, hematuria, rash, seizure activity, orthopnea, PND, pedal edema, claudication. Remaining systems are negative.  Physical Exam: Well-developed well-nourished in no acute distress.  Skin is warm and dry.  HEENT is normal.  Neck is supple. No thyromegaly.  Chest is clear to auscultation with normal expansion.  Cardiovascular exam is irregular Abdominal exam nontender or distended. No masses palpated. Extremities show no edema. neuro grossly intact  ECG atrial fibrillation at a rate of 82. RV conduction delay.

## 2010-12-28 NOTE — Assessment & Plan Note (Signed)
Patient counseled on discontinuing. 

## 2010-12-28 NOTE — Assessment & Plan Note (Signed)
Patient's heart rate is controlled at present. Continue present medications. Check 24-hour Holter monitor to make sure that his rate is controlled over the course of the day. Repeat echocardiogram. Continue Coumadin with goal INR 2-3.

## 2010-12-28 NOTE — Assessment & Plan Note (Signed)
Blood pressure controlled. Continue present medications. 

## 2011-01-05 NOTE — Progress Notes (Signed)
Addended by: Burnett Kanaris A on: 01/05/2011 10:56 AM   Modules accepted: Orders

## 2011-01-08 ENCOUNTER — Encounter (INDEPENDENT_AMBULATORY_CARE_PROVIDER_SITE_OTHER): Payer: Self-pay

## 2011-01-08 ENCOUNTER — Ambulatory Visit (HOSPITAL_COMMUNITY): Payer: Self-pay | Attending: Cardiology | Admitting: Radiology

## 2011-01-08 DIAGNOSIS — I4891 Unspecified atrial fibrillation: Secondary | ICD-10-CM | POA: Insufficient documentation

## 2011-01-08 DIAGNOSIS — J4489 Other specified chronic obstructive pulmonary disease: Secondary | ICD-10-CM | POA: Insufficient documentation

## 2011-01-08 DIAGNOSIS — J449 Chronic obstructive pulmonary disease, unspecified: Secondary | ICD-10-CM | POA: Insufficient documentation

## 2011-01-08 DIAGNOSIS — E785 Hyperlipidemia, unspecified: Secondary | ICD-10-CM | POA: Insufficient documentation

## 2011-01-08 DIAGNOSIS — Z8673 Personal history of transient ischemic attack (TIA), and cerebral infarction without residual deficits: Secondary | ICD-10-CM | POA: Insufficient documentation

## 2011-01-12 ENCOUNTER — Telehealth: Payer: Self-pay | Admitting: Cardiology

## 2011-01-12 NOTE — Telephone Encounter (Signed)
Spoke with pt, aware of test results [name]  

## 2011-01-12 NOTE — Telephone Encounter (Signed)
Pt rtn debra's call

## 2011-01-13 ENCOUNTER — Telehealth: Payer: Self-pay | Admitting: *Deleted

## 2011-01-13 MED ORDER — METOPROLOL TARTRATE 100 MG PO TABS: 100.0000 mg | ORAL_TABLET | Freq: Two times a day (BID) | ORAL | Status: DC

## 2011-01-13 NOTE — Telephone Encounter (Signed)
Spoke with pt, made aware monitor reviewed by dr Jens Som shows atrial fib and rate mildly elevated. Pt instructed to increase metoprolol to 100 mg twice daily [name]

## 2011-01-18 ENCOUNTER — Ambulatory Visit (INDEPENDENT_AMBULATORY_CARE_PROVIDER_SITE_OTHER): Payer: Self-pay | Admitting: *Deleted

## 2011-01-18 DIAGNOSIS — Z7901 Long term (current) use of anticoagulants: Secondary | ICD-10-CM

## 2011-01-18 DIAGNOSIS — I4891 Unspecified atrial fibrillation: Secondary | ICD-10-CM

## 2011-01-18 LAB — POCT INR: INR: 2.6

## 2011-01-29 ENCOUNTER — Other Ambulatory Visit: Payer: Self-pay | Admitting: Cardiovascular Disease

## 2011-02-15 ENCOUNTER — Ambulatory Visit (INDEPENDENT_AMBULATORY_CARE_PROVIDER_SITE_OTHER): Payer: Self-pay | Admitting: *Deleted

## 2011-02-15 DIAGNOSIS — I4891 Unspecified atrial fibrillation: Secondary | ICD-10-CM

## 2011-02-15 DIAGNOSIS — Z7901 Long term (current) use of anticoagulants: Secondary | ICD-10-CM

## 2011-03-15 ENCOUNTER — Ambulatory Visit (INDEPENDENT_AMBULATORY_CARE_PROVIDER_SITE_OTHER): Payer: Self-pay | Admitting: Pharmacist

## 2011-03-15 DIAGNOSIS — I4891 Unspecified atrial fibrillation: Secondary | ICD-10-CM

## 2011-03-15 DIAGNOSIS — Z7901 Long term (current) use of anticoagulants: Secondary | ICD-10-CM

## 2011-04-12 ENCOUNTER — Other Ambulatory Visit: Payer: Self-pay | Admitting: Cardiovascular Disease

## 2011-04-12 ENCOUNTER — Ambulatory Visit (INDEPENDENT_AMBULATORY_CARE_PROVIDER_SITE_OTHER): Payer: Self-pay | Admitting: Pharmacist

## 2011-04-12 DIAGNOSIS — I4891 Unspecified atrial fibrillation: Secondary | ICD-10-CM

## 2011-04-12 DIAGNOSIS — Z7901 Long term (current) use of anticoagulants: Secondary | ICD-10-CM

## 2011-04-12 LAB — POCT INR: INR: 2.9

## 2011-04-20 ENCOUNTER — Emergency Department (HOSPITAL_COMMUNITY): Payer: Self-pay

## 2011-04-20 ENCOUNTER — Emergency Department (HOSPITAL_COMMUNITY)
Admission: EM | Admit: 2011-04-20 | Discharge: 2011-04-20 | Disposition: A | Discharge status: Home / Self Care | Payer: Self-pay | Attending: Emergency Medicine | Admitting: Emergency Medicine

## 2011-04-20 ENCOUNTER — Encounter (HOSPITAL_COMMUNITY): Payer: Self-pay | Admitting: *Deleted

## 2011-04-20 ENCOUNTER — Encounter: Payer: Self-pay | Admitting: Endocrinology

## 2011-04-20 ENCOUNTER — Ambulatory Visit (INDEPENDENT_AMBULATORY_CARE_PROVIDER_SITE_OTHER): Payer: Self-pay | Admitting: Endocrinology

## 2011-04-20 ENCOUNTER — Other Ambulatory Visit: Payer: Self-pay

## 2011-04-20 DIAGNOSIS — I4891 Unspecified atrial fibrillation: Secondary | ICD-10-CM | POA: Insufficient documentation

## 2011-04-20 DIAGNOSIS — J9811 Atelectasis: Secondary | ICD-10-CM

## 2011-04-20 DIAGNOSIS — R0789 Other chest pain: Secondary | ICD-10-CM

## 2011-04-20 DIAGNOSIS — R609 Edema, unspecified: Secondary | ICD-10-CM | POA: Insufficient documentation

## 2011-04-20 DIAGNOSIS — R079 Chest pain, unspecified: Secondary | ICD-10-CM | POA: Insufficient documentation

## 2011-04-20 DIAGNOSIS — I10 Essential (primary) hypertension: Secondary | ICD-10-CM | POA: Insufficient documentation

## 2011-04-20 DIAGNOSIS — M7989 Other specified soft tissue disorders: Secondary | ICD-10-CM | POA: Insufficient documentation

## 2011-04-20 DIAGNOSIS — R0602 Shortness of breath: Secondary | ICD-10-CM | POA: Insufficient documentation

## 2011-04-20 DIAGNOSIS — J449 Chronic obstructive pulmonary disease, unspecified: Secondary | ICD-10-CM | POA: Insufficient documentation

## 2011-04-20 DIAGNOSIS — M25473 Effusion, unspecified ankle: Secondary | ICD-10-CM

## 2011-04-20 DIAGNOSIS — J4489 Other specified chronic obstructive pulmonary disease: Secondary | ICD-10-CM | POA: Insufficient documentation

## 2011-04-20 DIAGNOSIS — E785 Hyperlipidemia, unspecified: Secondary | ICD-10-CM | POA: Insufficient documentation

## 2011-04-20 DIAGNOSIS — R110 Nausea: Secondary | ICD-10-CM | POA: Insufficient documentation

## 2011-04-20 DIAGNOSIS — R209 Unspecified disturbances of skin sensation: Secondary | ICD-10-CM | POA: Insufficient documentation

## 2011-04-20 LAB — CBC
MCHC: 35.8 g/dL (ref 30.0–36.0)
Platelets: 234 10*3/uL (ref 150–400)
RDW: 13.5 % (ref 11.5–15.5)
WBC: 7.9 10*3/uL (ref 4.0–10.5)

## 2011-04-20 LAB — CARDIAC PANEL(CRET KIN+CKTOT+MB+TROPI)
CK, MB: 3.1 ng/mL (ref 0.3–4.0)
Relative Index: 1.5 (ref 0.0–2.5)
Total CK: 210 U/L (ref 7–232)
Troponin I: <0.3 ng/mL (ref <0.30)

## 2011-04-20 LAB — BASIC METABOLIC PANEL
Calcium: 9.5 mg/dL (ref 8.4–10.5)
Chloride: 102 mEq/L (ref 96–112)
Creatinine, Ser: 0.97 mg/dL (ref 0.50–1.35)
GFR calc Af Amer: >90 mL/min (ref >90)
GFR calc non Af Amer: 87 mL/min — ABNORMAL LOW (ref >90)

## 2011-04-20 LAB — POCT I-STAT, CHEM 8
Chloride: 104 mEq/L (ref 96–112)
HCT: 46 % (ref 39.0–52.0)
Hemoglobin: 15.6 g/dL (ref 13.0–17.0)
Potassium: 4.1 mEq/L (ref 3.5–5.1)
Sodium: 141 mEq/L (ref 135–145)

## 2011-04-20 LAB — POCT I-STAT TROPONIN I: Troponin i, poc: 0.01 ng/mL (ref 0.00–0.08)

## 2011-04-20 MED ORDER — NITROGLYCERIN 0.4 MG SL SUBL
0.4000 mg | SUBLINGUAL_TABLET | Freq: Once | SUBLINGUAL | Status: AC
  Administered 2011-04-20: 0.4 mg via SUBLINGUAL

## 2011-04-20 MED ORDER — OXYCODONE-ACETAMINOPHEN 7.5-325 MG PO TABS: 1.0000 | ORAL_TABLET | ORAL | Status: AC | PRN

## 2011-04-20 NOTE — ED Provider Notes (Signed)
36:67 PM  61 year old gentleman with a known history of A. fib, hypertension, hyperlipidemia, COPD. Presents with left-sided chest pain, intermittent, and persistent over the past 2 days. Also, reports shortness of breath with exertion, nausea, numbness. He also reports swelling in his left ankle greater than his right ankle.  EKG shows A. fib with nonspecific ST changes. No systemic.  Placenta. The main ED for further evaluation. Cardiac panel has been ordered. Initial vitals are stable with the exception of hypertension.   Awake, alert Supple cta b  No murmur rub gallop  abd soft non tender  Skin warm dry  Mild edema LLE     Troi Bechtold A. Patrica Duel, MD 04/20/11 1331

## 2011-04-20 NOTE — ED Provider Notes (Signed)
History     CSN: [id number]  Arrival date & time 04/20/11  1241   First MD Initiated Contact with Patient 04/20/11 1324      Chief Complaint  Patient presents with  . Chest Pain  . Leg Swelling    (Consider location/radiation/quality/duration/timing/severity/associated sxs/prior treatment) HPI  Past Medical History  Diagnosis Date  . Atrial fibrillation   . Hypertension   . Hyperlipidemia   . COPD (chronic obstructive pulmonary disease)   . Amaurosis fugax of left eye     history of     History reviewed. No pertinent past surgical history.  Family History  Problem Relation Age of Onset  . Hypertension Mother   . Cancer Father     gastric cancer    History  Substance Use Topics  . Smoking status: Current Some Day Smoker  . Smokeless tobacco: Not on file  . Alcohol Use: No      Review of Systems  Allergies  Aspirin  Home Medications   Current Outpatient Rx  Name Route Sig Dispense Refill  . DILTIAZEM HCL ER 240 MG PO CP24 Oral Take 1 capsule (240 mg total) by mouth daily. 30 capsule 11  . METOPROLOL TARTRATE 100 MG PO TABS Oral Take 100 mg by mouth 2 (two) times daily.    Marland Kitchen PRAVASTATIN SODIUM 40 MG PO TABS  TAKE ONE TABLET BY MOUTH AT BEDTIME 30 tablet 8  . WARFARIN SODIUM 5 MG PO TABS Oral Take 5-7.5 mg by mouth daily. 1.5 sun and thu and 1 tablet all other days    . OXYCODONE-ACETAMINOPHEN 7.5-325 MG PO TABS Oral Take 1 tablet by mouth every 4 (four) hours as needed for pain. 30 tablet 0    BP 154/102  Pulse 73  Temp 97.4 F (36.3 C)  Resp 16  SpO2 100%  Physical Exam  Nursing note and vitals reviewed. Constitutional: He is oriented to person, place, and time. He appears well-developed and well-nourished. No distress.  HENT:  Head: Normocephalic and atraumatic.  Eyes: Pupils are equal, round, and reactive to light.  Neck: Normal range of motion.  Cardiovascular: Normal rate and intact distal pulses.          Date: 04/20/2011  Rate: 74  Rhythm: atrial fibrillation  QRS Axis: normal  Intervals: af  ST/T Wave abnormalities: normal  Conduction Disutrbances:none:   Old EKG Reviewed: changes noted(rate now controlled)     Pulmonary/Chest: No respiratory distress. He has wheezes. He exhibits tenderness.    Abdominal: Normal appearance. He exhibits no distension.  Musculoskeletal: Normal range of motion.       Right ankle: He exhibits no swelling.       Left ankle: He exhibits swelling.       Right upper leg: He exhibits no tenderness, no swelling and no edema.       Left upper leg: He exhibits no tenderness, no swelling and no edema.       Right lower leg: He exhibits no tenderness, no swelling and no edema.       Left lower leg: He exhibits no tenderness, no swelling and no edema.  Neurological: He is alert and oriented to person, place, and time. No cranial nerve deficit.  Skin: Skin is warm and dry. No rash noted.  Psychiatric: He has a normal mood and affect. His behavior is normal.    ED Course  Procedures (including critical care time)  Labs Reviewed  BASIC METABOLIC PANEL - Abnormal; Notable for the following:  Glucose, Bld 100 (*)    GFR calc non Af Amer 87 (*)    All other components within normal limits  POCT I-STAT, CHEM 8 - Abnormal; Notable for the following:    Glucose, Bld 100 (*)    All other components within normal limits  CARDIAC PANEL(CRET KIN+CKTOT+MB+TROPI)  CBC  POCT I-STAT TROPONIN I   Dg Chest 2 View  04/20/2011  *RADIOLOGY REPORT*  Clinical Data: Chest pain  CHEST - 2 VIEW  Comparison: 03/10/2010  Findings: Heart size appears normal.  No pleural effusion or pulmonary edema.  Scar versus plate-like atelectasis is noted in the left base.  New from previous exam.  No airspace consolidation.  IMPRESSION:  1.  Scar versus plate-like atelectasis in the left base.  Original Report Authenticated By: Rosealee Albee, M.D.     1. Chest wall pain   2. Ankle edema   3. Atelectasis        MDM         Nelia Shi, MD 04/20/11 [phone]

## 2011-04-20 NOTE — ED Notes (Signed)
Pt presents to department for evaluation of L sided back pain radiating around to L side of chest. Onset Sunday morning. Nothing makes pain worse. Pt states pain was relieved by taking hydrocodone at home. Also states SOB on exertion. Also reports chronic L ankle swelling x6 months. Diminished lung sounds bilateral bases. Pt conscious alert and oriented x4. Skin warm and dry. Speaking complete sentences. No signs of distress noted at the present.

## 2011-04-20 NOTE — ED Notes (Signed)
Pt discharged home, vital signs stable. No signs of distress noted. Had no further questions. Encouraged to follow up with PCP.

## 2011-04-20 NOTE — ED Notes (Signed)
Patient undressed and in a gown. Cardiac monitor, pulse oximetry, and blood pressure cuff on. 

## 2011-04-20 NOTE — ED Notes (Signed)
Pt is here left sided chest pain that started Sunday morning at 0500.  Pt has sob with exertion. Pt has intermittent nausea in the mornings that has been going on for a while.  PT reports bilateral LE swelling

## 2011-04-20 NOTE — Patient Instructions (Signed)
Go to the emergency room now

## 2011-04-20 NOTE — Discharge Instructions (Signed)
Chest Wall Pain Chest wall pain is pain in or around the bones and muscles of your chest. It may take up to 6 weeks to get better. It may take longer if you must stay physically active in your work and activities.  CAUSES  Chest wall pain may happen on its own. However, it may be caused by:  A viral illness like the flu.   Injury.   Coughing.   Exercise.   Arthritis.   Fibromyalgia.   Shingles.  HOME CARE INSTRUCTIONS   Avoid overtiring physical activity. Try not to strain or perform activities that cause pain. This includes any activities using your chest or your abdominal and side muscles, especially if heavy weights are used.   Put ice on the sore area.   Put ice in a plastic bag.   Place a towel between your skin and the bag.   Leave the ice on for 15 to 20 minutes per hour while awake for the first 2 days.   Only take over-the-counter or prescription medicines for pain, discomfort, or fever as directed by your caregiver.  SEEK IMMEDIATE MEDICAL CARE IF:   Your pain increases, or you are very uncomfortable.   You have a fever.   Your chest pain becomes worse.   You have new, unexplained symptoms.   You have nausea or vomiting.   You feel sweaty or lightheaded.   You have a cough with phlegm (sputum), or you cough up blood.  MAKE SURE YOU:   Understand these instructions.   Will watch your condition.   Will get help right away if you are not doing well or get worse.  Document Released: 01/25/2005 Document Revised: 01/14/2011 Document Reviewed: 09/21/2010 [facility] Patient Information 2012 [place], [place]  .Atelectasis  Atelectasis involves a collapse of the small air sacs in the lungs. It is a condition in which all or part of a lung or lungs collapse and become airless. Atelectasis may come on suddenly (acute). Or it may persist long-term (chronic). In acute atelectasis, the lung has recently collapsed. This is mainly significant only for its airlessness.  Chronic atelectasis has the airlessness, too. But it also may have infection and scarring (fibrosis). There may also be a widening of the bronchi (bronchiectasis). Bronchiectasis is a disease of the lungs in which the structure of the tubes lining the lungs (bronchi) is distorted. This makes it difficult for bronchial secretions to be expelled. This leads to infections which are difficult to treat. There is often a persistent, productive cough. Smokers have a greater risk of developing all the lung problems.  CAUSES  The most common cause of atelectasis is obstruction of a bronchus. The bronchi are the two large branches of the trachea which lead directly to the lungs. Smaller airways (bronchioles) can also become blocked. Obstructions may be caused by mucus plugs, tumors, or inhaled foreign bodies. Outside pressure from tumors, enlarged lymph nodes, or fluid (pleural effusion) may cause the problem. A collapse of a lung when air gets between the lung and the rib cage (pneumothorax) may also lead to atelectasis.  This happens because of the following mechanism:  When an airway becomes blocked, the air in the small air sacs of the lung (alveoli) beyond the blockage is absorbed into the bloodstream. This causes the alveoli to collapse. After a little time, the collapsed lung tissue may fill with blood cells, serum, and mucus. This abnormal collection of material is likely to become infected.  Acute atelectasis is a common  problem after surgery. During an operation, portions of the lung may not expand as well as usual. This leads to collapse of the small air sacs. Injuries can cause a similar collapse. Medications which depress breathing (respiration) may also lead to atelectasis. Anything that decreases the size of breaths taken in can cause atelectasis. This could be pain, a tight bandage around the chest, muscle weakness, nerve problems, or broken ribs.  Any of the causes of acute atelectasis may persist and  become chronic. When they become chronic; infection, scarring, and other problems are likely to follow. When they are severe enough, shortness of breath and heart problems may follow.  SYMPTOMS  The loss of working lung tissue leads to shortness of breath. This decreases the blood oxygen level. That makes the heart work harder and beat faster. There may also be cyanosis. That is a blue color to your nail beds and mucous membranes, such as your lips and mouth.  The severity of symptoms depends on how rapidly this happens and over how much time.  The symptoms are not as bad if the problems come on slowly over a long period of time. This is because your body has time to adapt.  A sudden severe change can result in loss of blood pressure (shock) and even death.  DIAGNOSIS  Your caregiver may suspect atelectasis based on your symptoms and physical findings. A chest X-ray may confirm their suspicions. Blood work and more specialized X-rays are sometimes required.  TREATMENT  The main treatment for sudden atelectasis is correction of the underlying cause. A blockage that cannot be removed by coughing or by suctioning the airways often can be removed by bronchoscopy. Bronchoscopy is like looking into the lung with a slender telescope. It can be used to take biopsies or to remove a foreign body.  Antibiotics are given for infection. Chronic atelectasis often is treated with antibiotics because infection is almost expected.  Sometimes affected lung parts are surgically removed. This is done if:  Recurring infections become disabling.  Bleeding is significant.  The problems persist despite treatment.  If a tumor is blocking the airway, relieving the obstruction by surgery, radiation therapy, or chemotherapy may prevent the condition from progressing to pneumonia.  Atelectasis due to deficient or ineffective surfactant is directed at treating the low blood oxygen. This is often done with mechanical ventilation.  Surfactant drugs are lifesaving for premature babies that are deficient. This therapy is experimental in adults with the acute respiratory distress and reduced surfactant activity.  Possible complications include:  Pneumonia.  Germs in the blood stream (sepsis).  Bronchiectasis.  Fluid accumulating in the lungs.  Lung failure.  This may lead to infection.  Most causes of atelectasis are easily treated and do well. Those caused by a tumor will depend on the ease of treating that tumor. In some cases it is not possible to determine the cause of atelectasis early on. So it is important to follow up with your caregiver.  PREVENTION  Stop smoking. This is especially necessary before or following surgery.  Following a surgery:  Breathe deeply.  Cough regularly.  Move around as soon as possible.  Using breathing devices to encourage deep breathing and exercises including changing positions to help drainage of secretions may help prevent atelectasis.  Chest deformities and neurologic conditions causing shallow breathing may benefit from mechanical devices to help breathing. One method is continuous positive airway pressure. These machines deliver oxygen through a nose or face mask. They make sure  the airways do not collapse. They do this by keeping a positive pressure in the airway. This stays positive even at the end of a breath. Sometimes additional respiratory support is needed with a mechanical ventilator.  SEEK IMMEDIATE MEDICAL CARE IF:  You develop increasing problems with your breathing.  You develop a fever over 102 F (38.9 C).  You develop severe chest pain.  You develop severe coughing or cough up any blood.  Document Released: 01/25/2005 Document Revised: 01/14/2011 Document Reviewed: 06/09/2006  [facility] Patient Information 2012 [place], [place].

## 2011-04-20 NOTE — Progress Notes (Signed)
  Subjective:    Patient ID: [name], male    DOB: 1950-09-21, 61 y.o.   MRN: [id number]  HPI Pt states 3 days of pain at the left anterior chest, and assoc mid-back pain.  He says pain is not necessarily worse with  exertion.  vicodin helps.  Past Medical History  Diagnosis Date  . Atrial fibrillation   . Hypertension   . Hyperlipidemia   . COPD (chronic obstructive pulmonary disease)   . Amaurosis fugax of left eye     history of     No past surgical history on file.  History   Social History  . Marital Status: Divorced    Spouse Name: N/A    Number of Children: N/A  . 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 Smoking status: Current Some Day Smoker  . Smokeless tobacco: Not on file  . Alcohol Use: No  . Drug Use: No  . Sexually Active: Not on file   Other Topics Concern  . Not on file   Social History Narrative  . No narrative on file    Current Outpatient Prescriptions on File Prior to Visit  Medication Sig Dispense Refill  . diltiazem (DILACOR XR) 240 MG 24 hr capsule Take 1 capsule (240 mg total) by mouth daily.  30 capsule  11  . metoprolol (LOPRESSOR) 100 MG tablet Take 1 tablet (100 mg total) by mouth 2 (two) times daily. Take one and one half tab twice a day  60 tablet  12  . warfarin (COUMADIN) 5 MG tablet TAKE AS DIRECTED BY ANTICOAGULATION CLINIC  40 tablet  3  . pravastatin (PRAVACHOL) 40 MG tablet TAKE ONE TABLET BY MOUTH AT BEDTIME  30 tablet  8   No current facility-administered medications on file prior to visit.    Allergies  Allergen Reactions  . Aspirin     Family History  Problem Relation Age of Onset  . Hypertension Mother   . Cancer Father     gastric cancer    BP 138/86  Pulse 80  Temp(Src) 98.2 F (36.8 C) (Oral)  Ht 5\' 10"  (1.778 m)  Wt 234 lb 12.8 oz (106.505 kg)  BMI 33.69 kg/m2  SpO2 97%  Review of Systems Denies loc, but he also has numbness of  the hands.      Objective:   Physical Exam VITAL SIGNS:  See vs page GENERAL: no distress. Chest-wall: nontender. LUNGS:  Clear to auscultation.   HEART:  Regular rate and rhythm without murmurs noted. Normal S1,S2.   Ext: 1+ bilat leg edema   i reviewed electrocardiogram    Assessment & Plan:  Chest pain, uncertain etiology.  Pain is atypical, but he has risk-factors.  (i discussed with dr ghim).

## 2011-05-24 ENCOUNTER — Ambulatory Visit (INDEPENDENT_AMBULATORY_CARE_PROVIDER_SITE_OTHER): Payer: Self-pay | Admitting: *Deleted

## 2011-05-24 DIAGNOSIS — Z7901 Long term (current) use of anticoagulants: Secondary | ICD-10-CM

## 2011-05-24 DIAGNOSIS — I4891 Unspecified atrial fibrillation: Secondary | ICD-10-CM

## 2011-05-24 LAB — POCT INR: INR: 3.6

## 2011-06-21 ENCOUNTER — Other Ambulatory Visit: Payer: Self-pay | Admitting: Cardiology

## 2011-07-06 ENCOUNTER — Ambulatory Visit (INDEPENDENT_AMBULATORY_CARE_PROVIDER_SITE_OTHER): Payer: Self-pay | Admitting: *Deleted

## 2011-07-06 DIAGNOSIS — Z7901 Long term (current) use of anticoagulants: Secondary | ICD-10-CM

## 2011-07-06 DIAGNOSIS — I4891 Unspecified atrial fibrillation: Secondary | ICD-10-CM

## 2011-07-06 LAB — POCT INR: INR: 3.9

## 2011-07-12 ENCOUNTER — Other Ambulatory Visit: Payer: Self-pay | Admitting: Cardiology

## 2011-08-03 ENCOUNTER — Ambulatory Visit (INDEPENDENT_AMBULATORY_CARE_PROVIDER_SITE_OTHER): Payer: Self-pay | Admitting: Pharmacist

## 2011-08-03 DIAGNOSIS — I4891 Unspecified atrial fibrillation: Secondary | ICD-10-CM

## 2011-08-03 DIAGNOSIS — Z7901 Long term (current) use of anticoagulants: Secondary | ICD-10-CM

## 2011-08-31 ENCOUNTER — Ambulatory Visit (INDEPENDENT_AMBULATORY_CARE_PROVIDER_SITE_OTHER): Payer: Medicare Other | Admitting: *Deleted

## 2011-08-31 DIAGNOSIS — I4891 Unspecified atrial fibrillation: Secondary | ICD-10-CM | POA: Diagnosis not present

## 2011-08-31 DIAGNOSIS — Z7901 Long term (current) use of anticoagulants: Secondary | ICD-10-CM

## 2011-09-06 ENCOUNTER — Other Ambulatory Visit: Payer: Self-pay | Admitting: Cardiology

## 2011-09-28 ENCOUNTER — Ambulatory Visit (INDEPENDENT_AMBULATORY_CARE_PROVIDER_SITE_OTHER): Payer: Medicare Other

## 2011-09-28 DIAGNOSIS — Z7901 Long term (current) use of anticoagulants: Secondary | ICD-10-CM

## 2011-09-28 DIAGNOSIS — I4891 Unspecified atrial fibrillation: Secondary | ICD-10-CM | POA: Diagnosis not present

## 2011-11-09 ENCOUNTER — Ambulatory Visit (INDEPENDENT_AMBULATORY_CARE_PROVIDER_SITE_OTHER): Payer: Medicare Other | Admitting: *Deleted

## 2011-11-09 DIAGNOSIS — Z7901 Long term (current) use of anticoagulants: Secondary | ICD-10-CM | POA: Diagnosis not present

## 2011-11-09 DIAGNOSIS — I4891 Unspecified atrial fibrillation: Secondary | ICD-10-CM | POA: Diagnosis not present

## 2011-11-09 LAB — POCT INR: INR: 2.9

## 2011-12-21 ENCOUNTER — Ambulatory Visit (INDEPENDENT_AMBULATORY_CARE_PROVIDER_SITE_OTHER): Payer: Medicare Other | Admitting: *Deleted

## 2011-12-21 DIAGNOSIS — I4891 Unspecified atrial fibrillation: Secondary | ICD-10-CM

## 2011-12-21 DIAGNOSIS — Z7901 Long term (current) use of anticoagulants: Secondary | ICD-10-CM | POA: Diagnosis not present

## 2012-02-01 ENCOUNTER — Ambulatory Visit (INDEPENDENT_AMBULATORY_CARE_PROVIDER_SITE_OTHER): Payer: Medicare Other

## 2012-02-01 DIAGNOSIS — Z7901 Long term (current) use of anticoagulants: Secondary | ICD-10-CM

## 2012-02-01 DIAGNOSIS — I4891 Unspecified atrial fibrillation: Secondary | ICD-10-CM

## 2012-02-01 LAB — POCT INR: INR: 3.2

## 2012-02-03 ENCOUNTER — Other Ambulatory Visit: Payer: Self-pay | Admitting: Cardiology

## 2012-03-07 ENCOUNTER — Ambulatory Visit (INDEPENDENT_AMBULATORY_CARE_PROVIDER_SITE_OTHER): Payer: Medicare Other | Admitting: *Deleted

## 2012-03-07 DIAGNOSIS — Z7901 Long term (current) use of anticoagulants: Secondary | ICD-10-CM

## 2012-03-07 DIAGNOSIS — I4891 Unspecified atrial fibrillation: Secondary | ICD-10-CM | POA: Diagnosis not present

## 2012-03-07 LAB — POCT INR: INR: 2.6

## 2012-03-17 ENCOUNTER — Telehealth: Payer: Self-pay | Admitting: Cardiology

## 2012-03-17 NOTE — Telephone Encounter (Signed)
New Problem: ° ° ° °I called the patient and was unable to reach them. I left a message on their voicemail with my name, the reason I called, the name of their physician, and a number to call back to schedule their appointment. ° °

## 2012-04-03 ENCOUNTER — Other Ambulatory Visit: Payer: Self-pay | Admitting: Cardiovascular Disease

## 2012-04-11 ENCOUNTER — Ambulatory Visit (INDEPENDENT_AMBULATORY_CARE_PROVIDER_SITE_OTHER): Payer: Medicare Other | Admitting: *Deleted

## 2012-04-11 DIAGNOSIS — I4891 Unspecified atrial fibrillation: Secondary | ICD-10-CM

## 2012-04-11 DIAGNOSIS — Z7901 Long term (current) use of anticoagulants: Secondary | ICD-10-CM

## 2012-04-11 LAB — POCT INR: INR: 2.7

## 2012-05-18 ENCOUNTER — Encounter: Payer: Self-pay | Admitting: Cardiology

## 2012-05-18 ENCOUNTER — Ambulatory Visit (INDEPENDENT_AMBULATORY_CARE_PROVIDER_SITE_OTHER): Payer: Medicare Other | Admitting: Cardiology

## 2012-05-18 ENCOUNTER — Ambulatory Visit (INDEPENDENT_AMBULATORY_CARE_PROVIDER_SITE_OTHER): Payer: Medicare Other

## 2012-05-18 VITALS — BP 140/100 | HR 77 | Ht 70.0 in | Wt 239.0 lb

## 2012-05-18 DIAGNOSIS — E7800 Pure hypercholesterolemia, unspecified: Secondary | ICD-10-CM | POA: Diagnosis not present

## 2012-05-18 DIAGNOSIS — I4891 Unspecified atrial fibrillation: Secondary | ICD-10-CM

## 2012-05-18 DIAGNOSIS — I10 Essential (primary) hypertension: Secondary | ICD-10-CM

## 2012-05-18 DIAGNOSIS — F17200 Nicotine dependence, unspecified, uncomplicated: Secondary | ICD-10-CM | POA: Diagnosis not present

## 2012-05-18 DIAGNOSIS — Z7901 Long term (current) use of anticoagulants: Secondary | ICD-10-CM | POA: Diagnosis not present

## 2012-05-18 LAB — CBC WITH DIFFERENTIAL/PLATELET
Basophils Relative: 0.3 % (ref 0.0–3.0)
Eosinophils Absolute: 0.1 10*3/uL (ref 0.0–0.7)
Lymphocytes Relative: 38.9 % (ref 12.0–46.0)
MCHC: 33.1 g/dL (ref 30.0–36.0)
MCV: 93 fl (ref 78.0–100.0)
Monocytes Absolute: 0.6 10*3/uL (ref 0.1–1.0)
Neutrophils Relative %: 53.4 % (ref 43.0–77.0)
Platelets: 264 10*3/uL (ref 150.0–400.0)
RBC: 5 Mil/uL (ref 4.22–5.81)
WBC: 8.5 10*3/uL (ref 4.5–10.5)

## 2012-05-18 LAB — LIPID PANEL
Cholesterol: 291 mg/dL — ABNORMAL HIGH (ref 0–200)
HDL: 30.4 mg/dL — ABNORMAL LOW (ref >39.00)
Triglycerides: 160 mg/dL — ABNORMAL HIGH (ref 0.0–149.0)

## 2012-05-18 LAB — BASIC METABOLIC PANEL
CO2: 26 mEq/L (ref 19–32)
Calcium: 9.3 mg/dL (ref 8.4–10.5)
Chloride: 104 mEq/L (ref 96–112)
Creatinine, Ser: 1 mg/dL (ref 0.4–1.5)
Sodium: 139 mEq/L (ref 135–145)

## 2012-05-18 LAB — HEPATIC FUNCTION PANEL
ALT: 24 U/L (ref 0–53)
AST: 21 U/L (ref 0–37)
Albumin: 4.2 g/dL (ref 3.5–5.2)
Total Bilirubin: 0.7 mg/dL (ref 0.3–1.2)

## 2012-05-18 LAB — LDL CHOLESTEROL, DIRECT: Direct LDL: 213.4 mg/dL

## 2012-05-18 MED ORDER — PRAVASTATIN SODIUM 40 MG PO TABS: 40.0000 mg | ORAL_TABLET | Freq: Every day | ORAL | Status: DC

## 2012-05-18 NOTE — Assessment & Plan Note (Signed)
Blood pressure mildly elevated. He will follow this and we will increase medications as needed. I would most likely add a low-dose diuretic if his blood pressure remains elevated.

## 2012-05-18 NOTE — Assessment & Plan Note (Signed)
Patient remains in atrial fibrillation. Continue beta blocker and calcium blocker for rate control. Check hemoglobin and renal function. If renal function normal will transition from Coumadin to apixiban.

## 2012-05-18 NOTE — Patient Instructions (Signed)
Call your pharmacy to inquire on cost Eliquis 5mg  twice daily.  Call us at [phone] to let us know if medication is affordable.

## 2012-05-18 NOTE — Assessment & Plan Note (Signed)
Continue statin. 

## 2012-05-18 NOTE — Progress Notes (Signed)
   HPI: Pleasant male with past medical history of atrial fibrillation and prior TIA associated with this. In 2009 his LV function was normal and a previous nuclear study showed no scar or ischemia. Previous PFTs revealed moderate obstructive disease. Abdominal ultrasound in June of 2012 showed no anneurysm. Echocardiogram in November of 2012 showed normal LV function, moderate biatrial enlargement and mild mitral regurgitation. Holter monitor in December of 2012 showed mildly elevated rate and his Lopressor was increased. I last saw him in Nov 2012. Since then, the patient has dyspnea with more extreme activities but not with routine activities. It is relieved with rest. It is not associated with chest pain. There is no orthopnea, PND or pedal edema. There is no syncope or palpitations. There is no exertional chest pain.    Current Outpatient Prescriptions  Medication Sig Dispense Refill  . diltiazem (DILACOR XR) 240 MG 24 hr capsule TAKE ONE CAPSULE BY MOUTH EVERY DAY  30 capsule  12  . metoprolol (LOPRESSOR) 100 MG tablet Take 100 mg by mouth 2 (two) times daily.      . pravastatin (PRAVACHOL) 40 MG tablet TAKE ONE TABLET BY MOUTH AT BEDTIME  30 tablet  0  . warfarin (COUMADIN) 5 MG tablet Take 5-7.5 mg by mouth daily. 1.5 sun and thu and 1 tablet all other days       No current facility-administered medications for this visit.     Past Medical History  Diagnosis Date  . Atrial fibrillation   . Hypertension   . Hyperlipidemia   . COPD (chronic obstructive pulmonary disease)   . Amaurosis fugax of left eye     history of     History reviewed. No pertinent past surgical history.  History   Social History  . Marital Status: Divorced    Spouse Name: N/A    Number of Children: N/A  . 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 Smoking status: Current Some Day Smoker  . Smokeless tobacco: Not on file  . Alcohol Use: No    . Drug Use: No  . Sexually Active: Not on file   Other Topics Concern  . Not on file   Social History Narrative  . No narrative on file    ROS: no fevers or chills, productive cough, hemoptysis, dysphasia, odynophagia, melena, hematochezia, dysuria, hematuria, rash, seizure activity, orthopnea, PND, pedal edema, claudication. Remaining systems are negative.  Physical Exam: Well-developed well-nourished in no acute distress.  Skin is warm and dry.  HEENT is normal.  Neck is supple.  Chest is clear to auscultation with normal expansion.  Cardiovascular exam is irregular Abdominal exam nontender or distended. No masses palpated. Extremities show no edema. neuro grossly intact  ECG atrial fibrillation at a rate of 77. No ST changes.

## 2012-05-18 NOTE — Patient Instructions (Addendum)
Your physician wants you to follow-up in: ONE YEAR WITH DR Shelda Pal will receive a reminder letter in the mail two months in advance. If you don't receive a letter, please call our office to schedule the follow-up appointment.   Your physician recommends that you HAVE LAB WORK TODAY  CHANGE FROM WARFARIN TO [place]

## 2012-05-18 NOTE — Assessment & Plan Note (Signed)
Patient counseled on discontinuing. 

## 2012-06-02 ENCOUNTER — Other Ambulatory Visit: Payer: Self-pay | Admitting: Cardiology

## 2012-06-21 ENCOUNTER — Ambulatory Visit (INDEPENDENT_AMBULATORY_CARE_PROVIDER_SITE_OTHER): Payer: Medicare Other | Admitting: Pharmacist

## 2012-06-21 ENCOUNTER — Ambulatory Visit (INDEPENDENT_AMBULATORY_CARE_PROVIDER_SITE_OTHER): Payer: Medicare Other | Admitting: Physician Assistant

## 2012-06-21 ENCOUNTER — Encounter: Payer: Self-pay | Admitting: Physician Assistant

## 2012-06-21 VITALS — BP 130/100 | HR 64 | Ht 70.0 in | Wt 237.8 lb

## 2012-06-21 DIAGNOSIS — I4891 Unspecified atrial fibrillation: Secondary | ICD-10-CM

## 2012-06-21 DIAGNOSIS — Z7901 Long term (current) use of anticoagulants: Secondary | ICD-10-CM

## 2012-06-21 DIAGNOSIS — I10 Essential (primary) hypertension: Secondary | ICD-10-CM

## 2012-06-21 LAB — POCT INR: INR: 2.1

## 2012-06-21 MED ORDER — METOPROLOL TARTRATE 100 MG PO TABS: 100.0000 mg | ORAL_TABLET | Freq: Two times a day (BID) | ORAL | Status: DC

## 2012-06-21 MED ORDER — HYDROCHLOROTHIAZIDE 25 MG PO TABS: 25.0000 mg | ORAL_TABLET | Freq: Every day | ORAL | Status: DC

## 2012-06-21 MED ORDER — DILTIAZEM HCL ER 240 MG PO CP24: 240.0000 mg | ORAL_CAPSULE | Freq: Every day | ORAL | Status: DC

## 2012-06-21 NOTE — Patient Instructions (Addendum)
Your physician recommends that you schedule a follow-up appointment in: 1 month with Herma Carson PA for high blood pressure 6/11  Your physician has recommended you make the following change in your medication:   START HYDROCHLOROTHIAZIDE (hctz) 25 MG EACH MORNING  Your physician recommends that you return for lab work in: 1 MONTH NEEDS BMET  TAKE BLOOD PRESSURE DAILY AND BRING RECORDINGS WITH YOU FOR YOUR NEXT APP.  2 Gram Low Sodium Diet A 2 gram sodium diet restricts the amount of sodium in the diet to no more than 2 g or 2000 mg daily. Limiting the amount of sodium is often used to help lower blood pressure. It is important if you have heart, liver, or kidney problems. Many foods contain sodium for flavor and sometimes as a preservative. When the amount of sodium in a diet needs to be low, it is important to know what to look for when choosing foods and drinks. The following includes some information and guidelines to help make it easier for you to adapt to a low sodium diet. QUICK TIPS  Do not add salt to food.  Avoid convenience items and fast food.  Choose unsalted snack foods.  Buy lower sodium products, often labeled as "lower sodium" or "no salt added."  Check food labels to learn how much sodium is in 1 serving.  When eating at a restaurant, ask that your food be prepared with less salt or none, if possible. READING FOOD LABELS FOR SODIUM INFORMATION The nutrition facts label is a good place to find how much sodium is in foods. Look for products with no more than 500 to 600 mg of sodium per meal and no more than 150 mg per serving. Remember that 2 g = 2000 mg. The food label may also list foods as:  Sodium-free: Less than 5 mg in a serving.  Very low sodium: 35 mg or less in a serving.  Low-sodium: 140 mg or less in a serving.  Light in sodium: 50% less sodium in a serving. For example, if a food that usually has 300 mg of sodium is changed to become light in sodium,  it will have 150 mg of sodium.  Reduced sodium: 25% less sodium in a serving. For example, if a food that usually has 400 mg of sodium is changed to reduced sodium, it will have 300 mg of sodium. CHOOSING FOODS Grains  Avoid: Salted crackers and snack items. Some cereals, including instant hot cereals. Bread stuffing and biscuit mixes. Seasoned rice or pasta mixes.  Choose: Unsalted snack items. Low-sodium cereals, oats, puffed wheat and rice, shredded wheat. English muffins and bread. Pasta. Meats  Avoid: Salted, canned, smoked, spiced, pickled meats, including fish and poultry. Bacon, ham, sausage, cold cuts, hot dogs, anchovies.  Choose: Low-sodium canned tuna and salmon. Fresh or frozen meat, poultry, and fish. Dairy  Avoid: Processed cheese and spreads. Cottage cheese. Buttermilk and condensed milk. Regular cheese.  Choose: Milk. Low-sodium cottage cheese. Yogurt. Sour cream. Low-sodium cheese. Fruits and Vegetables  Avoid: Regular canned vegetables. Regular canned tomato sauce and paste. Frozen vegetables in sauces. Olives. Rosita Fire. Relishes. Sauerkraut.  Choose: Low-sodium canned vegetables. Low-sodium tomato sauce and paste. Frozen or fresh vegetables. Fresh and frozen fruit. Condiments  Avoid: Canned and packaged gravies. Worcestershire sauce. Tartar sauce. Barbecue sauce. Soy sauce. Steak sauce. Ketchup. Onion, garlic, and table salt. Meat flavorings and tenderizers.  Choose: Fresh and dried herbs and spices. Low-sodium varieties of mustard and ketchup. Lemon juice. Tabasco sauce.  Horseradish. SAMPLE 2 GRAM SODIUM MEAL PLAN Breakfast / Sodium (mg)  1 cup low-fat milk / 143 mg  2 slices whole-wheat toast / 270 mg  1 tbs heart-healthy margarine / 153 mg  1 hard-boiled egg / 139 mg  1 small orange / 0 mg Lunch / Sodium (mg)  1 cup raw carrots / 76 mg   cup hummus / 298 mg  1 cup low-fat milk / 143 mg   cup red grapes / 2 mg  1 whole-wheat pita bread / 356  mg Dinner / Sodium (mg)  1 cup whole-wheat pasta / 2 mg  1 cup low-sodium tomato sauce / 73 mg  3 oz lean ground beef / 57 mg  1 small side salad (1 cup raw spinach leaves,  cup cucumber,  cup yellow bell pepper) with 1 tsp olive oil and 1 tsp red wine vinegar / 25 mg Snack / Sodium (mg)  1 container low-fat vanilla yogurt / 107 mg  3 graham cracker squares / 127 mg Nutrient Analysis  Calories: 2033  Protein: 77 g  Carbohydrate: 282 g  Fat: 72 g  Sodium: 1971 mg Document Released: 01/25/2005 Document Revised: 04/19/2011 Document Reviewed: 04/28/2009 [facility] Patient Information 2013 [place], [place].

## 2012-06-21 NOTE — Assessment & Plan Note (Signed)
Stable

## 2012-06-21 NOTE — Assessment & Plan Note (Addendum)
Patient's blood pressure remains elevated. I discussed in detail a 2 g sodium diet. I will have hydrochlorothiazide 25 mg to his medications. He is to take his blood pressure daily at home and bring a list in with him of these readings. I will see him back in one month followup. He will have a BM ET.Refill other medications.

## 2012-06-21 NOTE — Progress Notes (Signed)
HPI:  This is a 62 year old white male patient Dr. Jens Som who has a history of atrial fibrillation and prior TIA who is been on Coumadin. He does have his yearly followup with Dr. Jens Som and was going to change him to Eliquis but the patient could not afford it, so he is back on Coumadin. He states his pharmacy would not refill his metoprolol until he came back to see [place]. His blood pressure remains elevated. He eats a lot of salty foods including many chips.    Allergies:  -- Aspirin -- Nausea And Vomiting  Current Outpatient Prescriptions on File Prior to Visit: diltiazem (DILACOR XR) 240 MG 24 hr capsule, TAKE ONE CAPSULE BY MOUTH EVERY DAY, Disp: 30 capsule, Rfl: 12 metoprolol (LOPRESSOR) 100 MG tablet, Take 100 mg by mouth 2 (two) times daily., Disp: , Rfl:  pravastatin (PRAVACHOL) 40 MG tablet, Take 1 tablet (40 mg total) by mouth daily., Disp: 30 tablet, Rfl: 12 warfarin (COUMADIN) 5 MG tablet, Take 5-7.5 mg by mouth daily. 1.5 sun and thu and 1 tablet all other days, Disp: , Rfl:   No current facility-administered medications on file prior to visit.   Past Medical History:   Atrial fibrillation                                          Hypertension                                                 Hyperlipidemia                                               COPD (chronic obstructive pulmonary disease)                 Amaurosis fugax of left eye                                    Comment:history of   No past surgical history on file.  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 Neck: No JVD, HJR, Bruit, or thyroid enlargement  Lungs: No tachypnea, clear without wheezing, rales, or rhonchi  Cardiovascular:irregular irregular, PMI not displaced,1/6 systolic murmur at the left border, no gallops, bruit, thrill, or heave.  Abdomen: BS normal. Soft without organomegaly, masses, lesions or tenderness.  Extremities: without cyanosis, clubbing or edema. Good distal pulses bilateral  SKin: Warm, no lesions or rashes   Musculoskeletal: No deformities  Neuro: no focal signs  BP 130/100  Pulse 64  Ht 5\' 10"  (1.778 m)  Wt 237 lb 12.8 oz (107.865 kg)  BMI 34.12 kg/m2  SpO2 97%

## 2012-07-07 ENCOUNTER — Other Ambulatory Visit: Payer: Self-pay | Admitting: Cardiology

## 2012-07-19 ENCOUNTER — Ambulatory Visit (INDEPENDENT_AMBULATORY_CARE_PROVIDER_SITE_OTHER): Payer: Medicare Other | Admitting: Physician Assistant

## 2012-07-19 ENCOUNTER — Encounter: Payer: Self-pay | Admitting: Physician Assistant

## 2012-07-19 ENCOUNTER — Other Ambulatory Visit (INDEPENDENT_AMBULATORY_CARE_PROVIDER_SITE_OTHER): Payer: Medicare Other

## 2012-07-19 VITALS — BP 128/76 | HR 64 | Ht 71.0 in | Wt 233.8 lb

## 2012-07-19 DIAGNOSIS — F17200 Nicotine dependence, unspecified, uncomplicated: Secondary | ICD-10-CM

## 2012-07-19 DIAGNOSIS — I4891 Unspecified atrial fibrillation: Secondary | ICD-10-CM | POA: Diagnosis not present

## 2012-07-19 DIAGNOSIS — I10 Essential (primary) hypertension: Secondary | ICD-10-CM

## 2012-07-19 LAB — BASIC METABOLIC PANEL
BUN: 17 mg/dL (ref 6–23)
Calcium: 9.7 mg/dL (ref 8.4–10.5)
Chloride: 102 mEq/L (ref 96–112)
Creatinine, Ser: 1.1 mg/dL (ref 0.4–1.5)

## 2012-07-19 NOTE — Patient Instructions (Signed)
Your physician wants you to follow-up in: 1 year with Dr. Jens Som.  You will receive a reminder letter in the mail two months in advance. If you don't receive a letter, please call our office to schedule the follow-up appointment.   Your physician recommends that you have lab work today: bmet  Monitor Blood pressure at home  Smoking cessation is encouraged   Stay on your low sodium diet

## 2012-07-19 NOTE — Progress Notes (Signed)
HPI:   This is a 62 yr old male patient of Dr. Jens Som who I saw 06/21/12 for uncontrolled HTN. I increased his HCTZ to 25 mg daily and place him on a 2 gm sodium diet as he was eating a lot of chips. He has done much better and his BP is controlled. He thinks he needs a new BP cuff at home because his readings are inaccurate compared to ours. He still smokes a few cigarettes a day. He also has a history of Atrial fibrillation and prior TIA on Coumadin.  Allergies:  -- Aspirin -- Nausea And Vomiting  Current Outpatient Prescriptions on File Prior to Visit: diltiazem (DILACOR XR) 240 MG 24 hr capsule, Take 1 capsule (240 mg total) by mouth daily., Disp: 30 capsule, Rfl: 6 hydrochlorothiazide (HYDRODIURIL) 25 MG tablet, Take 1 tablet (25 mg total) by mouth daily., Disp: 30 tablet, Rfl: 6 metoprolol (LOPRESSOR) 100 MG tablet, Take 1 tablet (100 mg total) by mouth 2 (two) times daily., Disp: 60 tablet, Rfl: 6 pravastatin (PRAVACHOL) 40 MG tablet, Take 1 tablet (40 mg total) by mouth daily., Disp: 30 tablet, Rfl: 12 warfarin (COUMADIN) 5 MG tablet, TAKE AS DIRECTED PER ANTICOAGULATION  CLINIC, Disp: 40 tablet, Rfl: 2  No current facility-administered medications on file prior to visit.   Past Medical History:   Atrial fibrillation                                          Hypertension                                                 Hyperlipidemia                                               COPD (chronic obstructive pulmonary disease)                 Amaurosis fugax of left eye                                    Comment:history of   No past surgical history on file.  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 Neck: No JVD, HJR, Bruit, or thyroid enlargement  Lungs: No tachypnea, clear without wheezing, rales, or rhonchi  Cardiovascular: RRR, PMI not displaced,1/6 systolic murmur LSB, nogallops, bruit, thrill, or heave.  Abdomen: BS normal. Soft without organomegaly, masses, lesions or tenderness.  Extremities: without cyanosis, clubbing or edema. Good distal pulses bilateral  SKin: Warm, no lesions or rashes   Musculoskeletal: No deformities  Neuro: no focal signs  BP 128/76  Pulse 64  Ht 5\' 11"  (1.803 m)  Wt 233 lb 12.8 oz (106.051 kg)  BMI 32.62 kg/m2

## 2012-07-19 NOTE — Assessment & Plan Note (Signed)
Controlled and on Coumadin

## 2012-07-19 NOTE — Assessment & Plan Note (Signed)
Smoking cessation discussed 

## 2012-07-19 NOTE — Assessment & Plan Note (Signed)
Much better control. Continue same meds and 2 gm sodium diet. Check BMET today. Continue to check BP at home and call if elevated. Follow up with Dr. Jens Som in 1 yr or sooner if needed.

## 2012-07-20 ENCOUNTER — Telehealth: Payer: Self-pay | Admitting: *Deleted

## 2012-07-20 ENCOUNTER — Other Ambulatory Visit: Payer: Self-pay | Admitting: *Deleted

## 2012-07-20 NOTE — Telephone Encounter (Signed)
PT AWARE OF LAB RESULTS FROM 07-19-12./CY

## 2012-08-01 ENCOUNTER — Ambulatory Visit (INDEPENDENT_AMBULATORY_CARE_PROVIDER_SITE_OTHER): Payer: Medicare Other | Admitting: *Deleted

## 2012-08-01 DIAGNOSIS — Z7901 Long term (current) use of anticoagulants: Secondary | ICD-10-CM

## 2012-08-01 DIAGNOSIS — I4891 Unspecified atrial fibrillation: Secondary | ICD-10-CM | POA: Diagnosis not present

## 2012-08-01 LAB — POCT INR: INR: 1.8

## 2012-08-29 ENCOUNTER — Ambulatory Visit (INDEPENDENT_AMBULATORY_CARE_PROVIDER_SITE_OTHER): Payer: Medicare Other | Admitting: *Deleted

## 2012-08-29 DIAGNOSIS — I4891 Unspecified atrial fibrillation: Secondary | ICD-10-CM | POA: Diagnosis not present

## 2012-08-29 DIAGNOSIS — Z7901 Long term (current) use of anticoagulants: Secondary | ICD-10-CM | POA: Diagnosis not present

## 2012-09-26 ENCOUNTER — Ambulatory Visit (INDEPENDENT_AMBULATORY_CARE_PROVIDER_SITE_OTHER): Payer: Medicare Other | Admitting: *Deleted

## 2012-09-26 DIAGNOSIS — Z7901 Long term (current) use of anticoagulants: Secondary | ICD-10-CM

## 2012-09-26 DIAGNOSIS — I4891 Unspecified atrial fibrillation: Secondary | ICD-10-CM

## 2012-10-26 ENCOUNTER — Ambulatory Visit (INDEPENDENT_AMBULATORY_CARE_PROVIDER_SITE_OTHER): Payer: Medicare Other | Admitting: Pharmacist

## 2012-10-26 DIAGNOSIS — I4891 Unspecified atrial fibrillation: Secondary | ICD-10-CM | POA: Diagnosis not present

## 2012-10-26 DIAGNOSIS — Z7901 Long term (current) use of anticoagulants: Secondary | ICD-10-CM

## 2012-10-30 ENCOUNTER — Other Ambulatory Visit: Payer: Self-pay | Admitting: Cardiology

## 2012-11-28 ENCOUNTER — Encounter: Payer: Self-pay | Admitting: Internal Medicine

## 2012-11-28 ENCOUNTER — Ambulatory Visit (INDEPENDENT_AMBULATORY_CARE_PROVIDER_SITE_OTHER): Payer: Medicare Other | Admitting: Internal Medicine

## 2012-11-28 VITALS — BP 140/82 | HR 121 | Temp 97.2°F | Wt 219.0 lb

## 2012-11-28 DIAGNOSIS — I471 Supraventricular tachycardia: Secondary | ICD-10-CM | POA: Diagnosis not present

## 2012-11-28 DIAGNOSIS — F4321 Adjustment disorder with depressed mood: Secondary | ICD-10-CM

## 2012-11-28 DIAGNOSIS — I4891 Unspecified atrial fibrillation: Secondary | ICD-10-CM

## 2012-11-28 DIAGNOSIS — R000 Tachycardia, unspecified: Secondary | ICD-10-CM

## 2012-11-28 MED ORDER — ALPRAZOLAM 0.5 MG PO TABS: 0.5000 mg | ORAL_TABLET | Freq: Every evening | ORAL | Status: DC | PRN

## 2012-11-28 NOTE — Patient Instructions (Signed)
Grief Reaction  Grief is a normal response to the death of someone close to you. Feelings of fear, anger, and guilt can affect almost everyone who loses someone they love. Symptoms of depression are also common. These include problems with sleep, loss of appetite, and lack of energy. These grief reaction symptoms often last for weeks to months after a loss. They may also return during special times that remind you of the person you lost, such as an anniversary or birthday.  Anxiety, insomnia, irritability, and deep depression may last beyond the period of normal grief. If you experience these feelings for 6 months or longer, you may have clinical depression. Clinical depression requires further medical attention. If you think that you have clinical depression, you should contact your caregiver. If you have a history of depression and or a family history of depression, you are at greater risk of clinical depression. You are also at greater risk of developing clinical depression if the loss was traumatic or the loss was of someone with whom you had unresolved issues.   A grief reaction can become complicated by being blocked. This means being unable to cry or express extreme emotions. This may prolong the grieving period and worsen the emotional effects of the loss. Mourning is a natural event in human life. A healthy grief reaction is one that is not blocked . It requires a time of sadness and readjustment.It is very important to share your sorrow and fear with others, especially close friends and family. Professional counselors and clergy can also help you process your grief.  Document Released: 01/25/2005 Document Revised: 04/19/2011 Document Reviewed: 10/05/2005  [employer] Patient Information 2014 [employer].

## 2012-11-28 NOTE — Progress Notes (Signed)
Subjective:    Patient ID: [name], male    DOB: 1950/09/03, 62 y.o.   MRN: [id number]  HPI  Pt presents to the clinic today with c/o grief reaction. This started this am when his mother passed away from a battle with cancer. He is very sad and anxious. He has no history of anxiety of depression. He denies SI/HI. He has not tried anything OTC. He does report having a strong support group. Of note, he is tachycardic rate 135 in afib. He is feeling SOB but thought it was due to the anxiety. He denies chest pain, chest tightness or shortness of breath.  Review of Systems      Past Medical History  Diagnosis Date  . Atrial fibrillation   . Hypertension   . Hyperlipidemia   . COPD (chronic obstructive pulmonary disease)   . Amaurosis fugax of left eye     history of     Current Outpatient Prescriptions  Medication Sig Dispense Refill  . diltiazem (DILACOR XR) 240 MG 24 hr capsule Take 1 capsule (240 mg total) by mouth daily.  30 capsule  6  . hydrochlorothiazide (HYDRODIURIL) 25 MG tablet Take 1 tablet (25 mg total) by mouth daily.  30 tablet  6  . metoprolol (LOPRESSOR) 100 MG tablet Take 1 tablet (100 mg total) by mouth 2 (two) times daily.  60 tablet  6  . pravastatin (PRAVACHOL) 40 MG tablet Take 1 tablet (40 mg total) by mouth daily.  30 tablet  12  . warfarin (COUMADIN) 5 MG tablet TAKE AS DIRECTED PER ANTICOAGULATION CLINIC  40 tablet  3   No current facility-administered medications for this visit.    Allergies  Allergen Reactions  . Aspirin Nausea And Vomiting    Family History  Problem Relation Age of Onset  . Hypertension Mother   . Cancer Father     gastric cancer    History   Social History  . Marital Status: Divorced    Spouse Name: N/A    Number of Children: N/A  . 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 Smoking status: Current Some Day Smoker  . Smokeless tobacco: Not on file   . Alcohol Use: No  . Drug Use: No  . Sexual Activity: Not on file   Other Topics Concern  . Not on file   Social History Narrative  . No narrative on file     Constitutional: Denies fever, malaise, fatigue, headache or abrupt weight changes.  Respiratory: Denies difficulty breathing, shortness of breath, cough or sputum production.   Cardiovascular: Pt reports tachycardia. Denies chest pain, chest tightness, palpitations or swelling in the hands or feet.  Psych: Pt reports anxiety due to grief. Denies depression, SI/HI.  No other specific complaints in a complete review of systems (except as listed in HPI above).  Objective:   Physical Exam   BP 140/82  Pulse 121  Temp(Src) 97.2 F (36.2 C) (Oral)  Wt 219 lb (99.338 kg)  BMI 30.56 kg/m2  SpO2 97% Wt Readings from Last 3 Encounters:  11/28/12 219 lb (99.338 kg)  07/19/12 233 lb 12.8 oz (106.051 kg)  06/21/12 237 lb 12.8 oz (107.865 kg)    General: Appears his  stated age, well developed, well nourished in NAD. Cardiovascular: tachycardic with irregular rhythm. S1,S2 noted.  No murmur, rubs or gallops noted. No JVD or BLE edema. No carotid bruits noted.  Pulmonary/Chest: Normal effort and positive vesicular breath sounds. No respiratory distress. No wheezes, rales or ronchi noted.  Psychiatric: Mood tearful and crying.. Behavior is normal. Judgment and thought content normal.     BMET    Component Value Date/Time   NA 140 07/19/2012 1034   K 3.5 07/19/2012 1034   CL 102 07/19/2012 1034   CO2 28 07/19/2012 1034   GLUCOSE 97 07/19/2012 1034   BUN 17 07/19/2012 1034   CREATININE 1.1 07/19/2012 1034   CALCIUM 9.7 07/19/2012 1034   GFRNONAA 87* 04/20/2011 1329   GFRAA >90 04/20/2011 1329    Lipid Panel     Component Value Date/Time   CHOL 291* 05/18/2012 1151   TRIG 160.0* 05/18/2012 1151   HDL 30.40* 05/18/2012 1151   CHOLHDL 10 05/18/2012 1151   VLDL 32.0 05/18/2012 1151   LDLCALC 136* 04/15/2010 1057    CBC     Component Value Date/Time   WBC 8.5 05/18/2012 1151   RBC 5.00 05/18/2012 1151   HGB 15.4 05/18/2012 1151   HCT 46.5 05/18/2012 1151   PLT 264.0 05/18/2012 1151   MCV 93.0 05/18/2012 1151   MCH 32.4 04/20/2011 1329   MCHC 33.1 05/18/2012 1151   RDW 13.4 05/18/2012 1151   LYMPHSABS 3.3 05/18/2012 1151   MONOABS 0.6 05/18/2012 1151   EOSABS 0.1 05/18/2012 1151   BASOSABS 0.0 05/18/2012 1151    Hgb A1C Lab Results  Component Value Date   HGBA1C  Value: 5.5 (NOTE)   The ADA recommends the following therapeutic goals for glycemic   control related to Hgb A1C measurement:   Goal of Therapy:   < 7.0% Hgb A1C   Action Suggested:  > 8.0% Hgb A1C   Ref:  Diabetes Care, 22, Suppl. 1, 1999 05/26/2007        Assessment & Plan:   Grief reaction due to loss of family member:  eRx for xanax 0.5 mg BID x 2 weeks Support offered today Will obtain EKG d/t tachycardia- afib rate 94- unchanged from prior  RTC in 2 weeks or sooner if needed

## 2012-12-06 ENCOUNTER — Ambulatory Visit: Payer: Medicare Other | Admitting: Internal Medicine

## 2012-12-06 ENCOUNTER — Ambulatory Visit (INDEPENDENT_AMBULATORY_CARE_PROVIDER_SITE_OTHER): Payer: Medicare Other | Admitting: *Deleted

## 2012-12-06 DIAGNOSIS — I4891 Unspecified atrial fibrillation: Secondary | ICD-10-CM

## 2012-12-06 DIAGNOSIS — Z7901 Long term (current) use of anticoagulants: Secondary | ICD-10-CM | POA: Diagnosis not present

## 2012-12-06 LAB — POCT INR: INR: 1.1

## 2012-12-07 ENCOUNTER — Encounter: Payer: Self-pay | Admitting: Internal Medicine

## 2012-12-07 ENCOUNTER — Ambulatory Visit (INDEPENDENT_AMBULATORY_CARE_PROVIDER_SITE_OTHER): Payer: Medicare Other | Admitting: Internal Medicine

## 2012-12-07 VITALS — BP 136/86 | HR 84 | Temp 97.5°F | Resp 16 | Wt 211.0 lb

## 2012-12-07 DIAGNOSIS — F418 Other specified anxiety disorders: Secondary | ICD-10-CM | POA: Insufficient documentation

## 2012-12-07 DIAGNOSIS — F341 Dysthymic disorder: Secondary | ICD-10-CM | POA: Diagnosis not present

## 2012-12-07 DIAGNOSIS — Z23 Encounter for immunization: Secondary | ICD-10-CM | POA: Diagnosis not present

## 2012-12-07 MED ORDER — TRAZODONE HCL 50 MG PO TABS: 25.0000 mg | ORAL_TABLET | Freq: Every evening | ORAL | Status: DC | PRN

## 2012-12-07 MED ORDER — CLONAZEPAM 1 MG PO TABS: 1.0000 mg | ORAL_TABLET | Freq: Three times a day (TID) | ORAL | Status: DC

## 2012-12-07 NOTE — Progress Notes (Signed)
  Subjective:    Patient ID: [name], male    DOB: 09-10-1950, 62 y.o.   MRN: [id number]  HPI  New to me he complains of anxiety over the last week after his mother's death.  Review of Systems  Constitutional: Negative.  Negative for fever, chills, diaphoresis, appetite change and fatigue.  HENT: Negative.   Eyes: Negative.   Respiratory: Negative.  Negative for apnea, cough, choking, chest tightness, shortness of breath, wheezing and stridor.   Cardiovascular: Negative for chest pain, palpitations and leg swelling.  Gastrointestinal: Negative.  Negative for abdominal pain.  Endocrine: Negative.   Genitourinary: Negative.   Musculoskeletal: Negative.   Skin: Negative.   Allergic/Immunologic: Negative.   Neurological: Negative.  Negative for dizziness, tremors, syncope and light-headedness.  Hematological: Negative.  Negative for adenopathy. Does not bruise/bleed easily.  Psychiatric/Behavioral: Positive for sleep disturbance and dysphoric mood. Negative for suicidal ideas, hallucinations, behavioral problems, confusion, self-injury, decreased concentration and agitation. The patient is nervous/anxious. The patient is not hyperactive.        Objective:   Physical Exam  Vitals reviewed. Constitutional: He appears well-developed and well-nourished. No distress.  HENT:  Head: Normocephalic and atraumatic.  Mouth/Throat: Oropharynx is clear and moist. No oropharyngeal exudate.  Eyes: Conjunctivae are normal. Right eye exhibits no discharge. Left eye exhibits no discharge. No scleral icterus.  Neck: Normal range of motion. Neck supple. No JVD present. No tracheal deviation present. No thyromegaly present.  Cardiovascular: Normal rate, normal heart sounds and intact distal pulses.  An irregularly irregular rhythm present. Exam reveals no gallop and no friction rub.   No murmur heard. Pulses:      Carotid pulses are 1+ on the right side, and 1+ on the left side.      Radial  pulses are 1+ on the right side, and 1+ on the left side.       Femoral pulses are 1+ on the right side, and 1+ on the left side.      Popliteal pulses are 1+ on the right side, and 1+ on the left side.       Dorsalis pedis pulses are 1+ on the right side, and 1+ on the left side.       Posterior tibial pulses are 1+ on the right side, and 1+ on the left side.  Pulmonary/Chest: Effort normal and breath sounds normal. No respiratory distress. He has no wheezes. He has no rales. He exhibits no tenderness.  Abdominal: Soft. Bowel sounds are normal. He exhibits no distension and no mass. There is no tenderness. There is no rebound and no guarding.  Musculoskeletal: Normal range of motion. He exhibits no edema and no tenderness.  Lymphadenopathy:    He has no cervical adenopathy.  Skin: Skin is warm and dry. No rash noted. He is not diaphoretic. No erythema. No pallor.  Psychiatric: His speech is normal and behavior is normal. Judgment and thought content normal. His mood appears anxious. His affect is not angry, not blunt, not labile and not inappropriate. Cognition and memory are normal. He exhibits a depressed mood (tearful).          Assessment & Plan:

## 2012-12-07 NOTE — Patient Instructions (Signed)

## 2012-12-09 NOTE — Assessment & Plan Note (Signed)
He will start trazodone and klonopin Will follow up soon

## 2012-12-13 ENCOUNTER — Ambulatory Visit (INDEPENDENT_AMBULATORY_CARE_PROVIDER_SITE_OTHER): Payer: Medicare Other | Admitting: Internal Medicine

## 2012-12-13 ENCOUNTER — Encounter: Payer: Self-pay | Admitting: Internal Medicine

## 2012-12-13 ENCOUNTER — Other Ambulatory Visit (INDEPENDENT_AMBULATORY_CARE_PROVIDER_SITE_OTHER): Payer: Medicare Other

## 2012-12-13 ENCOUNTER — Ambulatory Visit (INDEPENDENT_AMBULATORY_CARE_PROVIDER_SITE_OTHER): Payer: Medicare Other | Admitting: *Deleted

## 2012-12-13 VITALS — BP 136/80 | HR 85 | Temp 97.1°F | Resp 16 | Ht 71.0 in | Wt 208.0 lb

## 2012-12-13 DIAGNOSIS — I10 Essential (primary) hypertension: Secondary | ICD-10-CM

## 2012-12-13 DIAGNOSIS — Z0000 Encounter for general adult medical examination without abnormal findings: Secondary | ICD-10-CM

## 2012-12-13 DIAGNOSIS — N138 Other obstructive and reflux uropathy: Secondary | ICD-10-CM

## 2012-12-13 DIAGNOSIS — Z7901 Long term (current) use of anticoagulants: Secondary | ICD-10-CM | POA: Diagnosis not present

## 2012-12-13 DIAGNOSIS — E7800 Pure hypercholesterolemia, unspecified: Secondary | ICD-10-CM | POA: Diagnosis not present

## 2012-12-13 DIAGNOSIS — I4891 Unspecified atrial fibrillation: Secondary | ICD-10-CM

## 2012-12-13 DIAGNOSIS — Z23 Encounter for immunization: Secondary | ICD-10-CM

## 2012-12-13 DIAGNOSIS — N401 Enlarged prostate with lower urinary tract symptoms: Secondary | ICD-10-CM | POA: Insufficient documentation

## 2012-12-13 LAB — CBC WITH DIFFERENTIAL/PLATELET
Basophils Absolute: 0 10*3/uL (ref 0.0–0.1)
Basophils Relative: 0.2 % (ref 0.0–3.0)
Eosinophils Absolute: 0.1 10*3/uL (ref 0.0–0.7)
Eosinophils Relative: 1 % (ref 0.0–5.0)
Hemoglobin: 15.6 g/dL (ref 13.0–17.0)
Lymphs Abs: 3.2 10*3/uL (ref 0.7–4.0)
MCHC: 33.7 g/dL (ref 30.0–36.0)
MCV: 93.8 fl (ref 78.0–100.0)
Monocytes Absolute: 0.5 10*3/uL (ref 0.1–1.0)
Neutro Abs: 4.3 10*3/uL (ref 1.4–7.7)
Neutrophils Relative %: 52.9 % (ref 43.0–77.0)
RBC: 4.92 Mil/uL (ref 4.22–5.81)
RDW: 13.3 % (ref 11.5–14.6)
WBC: 8.1 10*3/uL (ref 4.5–10.5)

## 2012-12-13 LAB — COMPREHENSIVE METABOLIC PANEL
ALT: 27 U/L (ref 0–53)
AST: 20 U/L (ref 0–37)
Albumin: 4.4 g/dL (ref 3.5–5.2)
Alkaline Phosphatase: 48 U/L (ref 39–117)
BUN: 15 mg/dL (ref 6–23)
Chloride: 94 mEq/L — ABNORMAL LOW (ref 96–112)
Creatinine, Ser: 1.1 mg/dL (ref 0.4–1.5)
Potassium: 4 mEq/L (ref 3.5–5.1)
Total Bilirubin: 0.8 mg/dL (ref 0.3–1.2)

## 2012-12-13 LAB — LIPID PANEL
Cholesterol: 208 mg/dL — ABNORMAL HIGH (ref 0–200)
Total CHOL/HDL Ratio: 5
Triglycerides: 120 mg/dL (ref 0.0–149.0)

## 2012-12-13 LAB — LDL CHOLESTEROL, DIRECT: Direct LDL: 148.7 mg/dL

## 2012-12-13 LAB — URINALYSIS, ROUTINE W REFLEX MICROSCOPIC
Ketones, ur: NEGATIVE
Nitrite: NEGATIVE
Specific Gravity, Urine: 1.015 (ref 1.000–1.030)
Urine Glucose: NEGATIVE
Urobilinogen, UA: 0.2 (ref 0.0–1.0)
WBC, UA: NONE SEEN (ref 0–?)
pH: 6 (ref 5.0–8.0)

## 2012-12-13 LAB — POCT INR: INR: 1.9

## 2012-12-13 NOTE — Patient Instructions (Signed)
Health Maintenance, Males A healthy lifestyle and preventative care can promote health and wellness.  Maintain regular health, dental, and eye exams.  Eat a healthy diet. Foods like vegetables, fruits, whole grains, low-fat dairy products, and lean protein foods contain the nutrients you need without too many calories. Decrease your intake of foods high in solid fats, added sugars, and salt. Get information about a proper diet from your caregiver, if necessary.  Regular physical exercise is one of the most important things you can do for your health. Most adults should get at least 150 minutes of moderate-intensity exercise (any activity that increases your heart rate and causes you to sweat) each week. In addition, most adults need muscle-strengthening exercises on 2 or more days a week.   Maintain a healthy weight. The body mass index (BMI) is a screening tool to identify possible weight problems. It provides an estimate of body fat based on height and weight. Your caregiver can help determine your BMI, and can help you achieve or maintain a healthy weight. For adults 20 years and older:  A BMI below 18.5 is considered underweight.  A BMI of 18.5 to 24.9 is normal.  A BMI of 25 to 29.9 is considered overweight.  A BMI of 30 and above is considered obese.  Maintain normal blood lipids and cholesterol by exercising and minimizing your intake of saturated fat. Eat a balanced diet with plenty of fruits and vegetables. Blood tests for lipids and cholesterol should begin at age 20 and be repeated every 5 years. If your lipid or cholesterol levels are high, you are over 50, or you are a high risk for heart disease, you may need your cholesterol levels checked more frequently.Ongoing high lipid and cholesterol levels should be treated with medicines, if diet and exercise are not effective.  If you smoke, find out from your caregiver how to quit. If you do not use tobacco, do not start.  Lung  cancer screening is recommended for adults aged 55 80 years who are at high risk for developing lung cancer because of a history of smoking. Yearly low-dose computed tomography (CT) is recommended for people who have at least a 30-pack-year history of smoking and are a current smoker or have quit within the past 15 years. A pack year of smoking is smoking an average of 1 pack of cigarettes a day for 1 year (for example: 1 pack a day for 30 years or 2 packs a day for 15 years). Yearly screening should continue until the smoker has stopped smoking for at least 15 years. Yearly screening should also be stopped for people who develop a health problem that would prevent them from having lung cancer treatment.  If you choose to drink alcohol, do not exceed 2 drinks per day. One drink is considered to be 12 ounces (355 mL) of beer, 5 ounces (148 mL) of wine, or 1.5 ounces (44 mL) of liquor.  Avoid use of street drugs. Do not share needles with anyone. Ask for help if you need support or instructions about stopping the use of drugs.  High blood pressure causes heart disease and increases the risk of stroke. Blood pressure should be checked at least every 1 to 2 years. Ongoing high blood pressure should be treated with medicines if weight loss and exercise are not effective.  If you are 45 to 63 years old, ask your caregiver if you should take aspirin to prevent heart disease.  Diabetes screening involves taking a blood   sample to check your fasting blood sugar level. This should be done once every 3 years, after age 45, if you are within normal weight and without risk factors for diabetes. Testing should be considered at a younger age or be carried out more frequently if you are overweight and have at least 1 risk factor for diabetes.  Colorectal cancer can be detected and often prevented. Most routine colorectal cancer screening begins at the age of 50 and continues through age 75. However, your caregiver may  recommend screening at an earlier age if you have risk factors for colon cancer. On a yearly basis, your caregiver may provide home test kits to check for hidden blood in the stool. Use of a small camera at the end of a tube, to directly examine the colon (sigmoidoscopy or colonoscopy), can detect the earliest forms of colorectal cancer. Talk to your caregiver about this at age 50, when routine screening begins. Direct examination of the colon should be repeated every 5 to 10 years through age 75, unless early forms of pre-cancerous polyps or small growths are found.  Hepatitis C blood testing is recommended for all people born from 1945 through 1965 and any individual with known risks for hepatitis C.  Healthy men should no longer receive prostate-specific antigen (PSA) blood tests as part of routine cancer screening. Consult with your caregiver about prostate cancer screening.  Testicular cancer screening is not recommended for adolescents or adult males who have no symptoms. Screening includes self-exam, caregiver exam, and other screening tests. Consult with your caregiver about any symptoms you have or any concerns you have about testicular cancer.  Practice safe sex. Use condoms and avoid high-risk sexual practices to reduce the spread of sexually transmitted infections (STIs).  Use sunscreen. Apply sunscreen liberally and repeatedly throughout the day. You should seek shade when your shadow is shorter than you. Protect yourself by wearing long sleeves, pants, a wide-brimmed hat, and sunglasses year round, whenever you are outdoors.  Notify your caregiver of new moles or changes in moles, especially if there is a change in shape or color. Also notify your caregiver if a mole is larger than the size of a pencil eraser.  A one-time screening for abdominal aortic aneurysm (AAA) and surgical repair of large AAAs by sound wave imaging (ultrasonography) is recommended for ages 65 to 75 years who are  current or former smokers.  Stay current with your immunizations. Document Released: 07/24/2007 Document Revised: 05/22/2012 Document Reviewed: 06/22/2010 [employer] Patient Information 2014 [employer].  

## 2012-12-13 NOTE — Assessment & Plan Note (Signed)
His BP is well controlled Today I will check his labs to look for end organ damage and for secondary causes of HTN

## 2012-12-13 NOTE — Progress Notes (Signed)
Pre-visit discussion using our clinic review tool. No additional management support is needed unless otherwise documented below in the visit note.  

## 2012-12-13 NOTE — Assessment & Plan Note (Signed)
I will check his UA and PSA to screen for prostate cancer

## 2012-12-13 NOTE — Assessment & Plan Note (Addendum)

## 2012-12-13 NOTE — Assessment & Plan Note (Signed)
FLP today 

## 2012-12-13 NOTE — Progress Notes (Signed)
Subjective:    Patient ID: [name], male    DOB: 06/17/1950, 62 y.o.   MRN: [id number]  Hypertension This is a chronic problem. The current episode started more than 1 year ago. The problem is unchanged. The problem is controlled. Associated symptoms include anxiety. Pertinent negatives include no blurred vision, chest pain, headaches, malaise/fatigue, neck pain, orthopnea, palpitations, peripheral edema, PND, shortness of breath or sweats. Past treatments include calcium channel blockers, beta blockers and diuretics. The current treatment provides moderate improvement. Compliance problems include diet and exercise.       Review of Systems  Constitutional: Negative.  Negative for malaise/fatigue.  HENT: Negative.   Eyes: Negative.  Negative for blurred vision.  Respiratory: Negative.  Negative for apnea, cough, choking, chest tightness, shortness of breath, wheezing and stridor.   Cardiovascular: Negative.  Negative for chest pain, palpitations, orthopnea, leg swelling and PND.  Gastrointestinal: Negative.  Negative for nausea, vomiting, abdominal pain, diarrhea, constipation and blood in stool.  Endocrine: Negative.   Genitourinary: Negative.  Negative for dysuria, urgency, frequency, hematuria, flank pain, decreased urine volume, discharge, penile swelling, scrotal swelling, enuresis, difficulty urinating, genital sores, penile pain and testicular pain.  Musculoskeletal: Negative.  Negative for arthralgias, back pain, gait problem, joint swelling, myalgias, neck pain and neck stiffness.  Skin: Negative.   Allergic/Immunologic: Negative.   Neurological: Negative.  Negative for headaches.  Hematological: Negative.  Negative for adenopathy. Does not bruise/bleed easily.  Psychiatric/Behavioral: Negative for suicidal ideas, hallucinations, behavioral problems, confusion, sleep disturbance, self-injury, dysphoric mood, decreased concentration and agitation. The patient is  nervous/anxious. The patient is not hyperactive.        Objective:   Physical Exam  Vitals reviewed. Constitutional: He is oriented to person, place, and time. He appears well-developed and well-nourished. No distress.  HENT:  Head: Normocephalic and atraumatic.  Mouth/Throat: Oropharynx is clear and moist. No oropharyngeal exudate.  Eyes: Conjunctivae are normal. Right eye exhibits no discharge. Left eye exhibits no discharge. No scleral icterus.  Neck: Normal range of motion. Neck supple. No JVD present. No tracheal deviation present. No thyromegaly present.  Cardiovascular: Normal rate, regular rhythm, normal heart sounds and intact distal pulses.  Exam reveals no gallop and no friction rub.   No murmur heard. Pulmonary/Chest: Effort normal and breath sounds normal. No stridor. No respiratory distress. He has no wheezes. He has no rales. He exhibits no tenderness.  Abdominal: Soft. Bowel sounds are normal. He exhibits no distension and no mass. There is no tenderness. There is no rebound and no guarding. Hernia confirmed negative in the right inguinal area and confirmed negative in the left inguinal area.  Genitourinary: Rectum normal and penis normal. Rectal exam shows no external hemorrhoid, no internal hemorrhoid, no fissure, no mass, no tenderness and anal tone normal. Guaiac negative stool. Prostate is enlarged (1+ smooth symm BPH). Prostate is not tender. Right testis shows no mass, no swelling and no tenderness. Right testis is descended. Left testis shows no mass, no swelling and no tenderness. Left testis is descended. Circumcised. No penile erythema or penile tenderness. No discharge found.  Musculoskeletal: Normal range of motion. He exhibits no edema and no tenderness.  Lymphadenopathy:    He has no cervical adenopathy.       Right: No inguinal adenopathy present.       Left: No inguinal adenopathy present.  Neurological: He is oriented to person, place, and time.  Skin: Skin  is warm and dry. No rash noted. He is not  diaphoretic. No erythema. No pallor.  Psychiatric: He has a normal mood and affect. His behavior is normal. Judgment and thought content normal.          Assessment & Plan:

## 2012-12-13 NOTE — Assessment & Plan Note (Signed)
He has good rate and rhythm control 

## 2012-12-14 ENCOUNTER — Encounter: Payer: Self-pay | Admitting: Internal Medicine

## 2012-12-20 ENCOUNTER — Ambulatory Visit (INDEPENDENT_AMBULATORY_CARE_PROVIDER_SITE_OTHER): Payer: Medicare Other | Admitting: Pharmacist

## 2012-12-20 DIAGNOSIS — Z7901 Long term (current) use of anticoagulants: Secondary | ICD-10-CM | POA: Diagnosis not present

## 2012-12-20 DIAGNOSIS — I4891 Unspecified atrial fibrillation: Secondary | ICD-10-CM | POA: Diagnosis not present

## 2013-01-03 ENCOUNTER — Encounter (INDEPENDENT_AMBULATORY_CARE_PROVIDER_SITE_OTHER): Payer: Self-pay

## 2013-01-03 ENCOUNTER — Ambulatory Visit (INDEPENDENT_AMBULATORY_CARE_PROVIDER_SITE_OTHER): Payer: Medicare Other | Admitting: *Deleted

## 2013-01-03 DIAGNOSIS — I4891 Unspecified atrial fibrillation: Secondary | ICD-10-CM

## 2013-01-03 DIAGNOSIS — Z7901 Long term (current) use of anticoagulants: Secondary | ICD-10-CM | POA: Diagnosis not present

## 2013-01-03 LAB — POCT INR: INR: 4.9

## 2013-01-17 ENCOUNTER — Ambulatory Visit (INDEPENDENT_AMBULATORY_CARE_PROVIDER_SITE_OTHER): Payer: Medicare Other | Admitting: Pharmacist

## 2013-01-17 VITALS — Wt 194.0 lb

## 2013-01-17 DIAGNOSIS — Z7901 Long term (current) use of anticoagulants: Secondary | ICD-10-CM | POA: Diagnosis not present

## 2013-01-17 DIAGNOSIS — I4891 Unspecified atrial fibrillation: Secondary | ICD-10-CM | POA: Diagnosis not present

## 2013-01-17 LAB — POCT INR: INR: 4.1

## 2013-01-29 ENCOUNTER — Encounter: Payer: Self-pay | Admitting: Internal Medicine

## 2013-01-29 ENCOUNTER — Ambulatory Visit (INDEPENDENT_AMBULATORY_CARE_PROVIDER_SITE_OTHER): Payer: Medicare Other | Admitting: Pharmacist

## 2013-01-29 ENCOUNTER — Ambulatory Visit (INDEPENDENT_AMBULATORY_CARE_PROVIDER_SITE_OTHER): Payer: Medicare Other | Admitting: Internal Medicine

## 2013-01-29 VITALS — BP 128/88 | HR 78 | Temp 97.0°F | Resp 16 | Ht 71.0 in | Wt 190.2 lb

## 2013-01-29 DIAGNOSIS — Z7901 Long term (current) use of anticoagulants: Secondary | ICD-10-CM

## 2013-01-29 DIAGNOSIS — F341 Dysthymic disorder: Secondary | ICD-10-CM

## 2013-01-29 DIAGNOSIS — R51 Headache: Secondary | ICD-10-CM

## 2013-01-29 DIAGNOSIS — F418 Other specified anxiety disorders: Secondary | ICD-10-CM

## 2013-01-29 DIAGNOSIS — I4891 Unspecified atrial fibrillation: Secondary | ICD-10-CM | POA: Diagnosis not present

## 2013-01-29 MED ORDER — CLONAZEPAM 1 MG PO TABS: 1.0000 mg | ORAL_TABLET | Freq: Three times a day (TID) | ORAL | Status: DC

## 2013-01-29 NOTE — Progress Notes (Signed)
Subjective:    Patient ID: ARMAAN POND, male    DOB: 1950/03/13, 62 y.o.   MRN: [id number]  Headache  This is a recurrent problem. Episode onset: 3 months ago. The problem occurs constantly. The pain is located in the bilateral region. The pain does not radiate. The quality of the pain is described as aching. The pain is at a severity of 3/10. The pain is mild. Pertinent negatives include no abdominal pain, abnormal behavior, anorexia, back pain, blurred vision, coughing, dizziness, drainage, ear pain, eye pain, eye redness, eye watering, facial sweating, fever, hearing loss, insomnia, loss of balance, muscle aches, nausea, neck pain, numbness, phonophobia, photophobia, rhinorrhea, scalp tenderness, seizures, sinus pressure, sore throat, swollen glands, tingling, tinnitus, visual change, vomiting, weakness or weight loss. The symptoms are aggravated by emotional stress. He has tried nothing for the symptoms. The treatment provided no relief. There is no history of cancer, cluster headaches, hypertension, immunosuppression, migraine headaches, migraines in the family, obesity, pseudotumor cerebri, recent head traumas, sinus disease or TMJ.      Review of Systems  Constitutional: Negative.  Negative for fever and weight loss.  HENT: Negative for ear pain, hearing loss, rhinorrhea, sinus pressure, sore throat and tinnitus.   Eyes: Negative.  Negative for blurred vision, photophobia, pain and redness.  Respiratory: Negative.  Negative for cough.   Cardiovascular: Negative.  Negative for chest pain, palpitations and leg swelling.  Gastrointestinal: Negative.  Negative for nausea, vomiting, abdominal pain, diarrhea, constipation, blood in stool and anorexia.  Endocrine: Negative.   Musculoskeletal: Negative.  Negative for back pain and neck pain.  Skin: Negative.   Allergic/Immunologic: Negative.   Neurological: Positive for headaches. Negative for dizziness, tingling, seizures, weakness,  numbness and loss of balance.  Hematological: Negative.  Negative for adenopathy. Does not bruise/bleed easily.  Psychiatric/Behavioral: Negative for suicidal ideas, hallucinations, behavioral problems, confusion, sleep disturbance, self-injury, dysphoric mood, decreased concentration and agitation. The patient is nervous/anxious. The patient does not have insomnia and is not hyperactive.        Objective:   Physical Exam  Vitals reviewed. Constitutional: He is oriented to person, place, and time. He appears well-developed and well-nourished.  Non-toxic appearance. He does not have a sickly appearance. He does not appear ill. No distress.  HENT:  Head: Normocephalic and atraumatic.  Mouth/Throat: Oropharynx is clear and moist. No oropharyngeal exudate.  Eyes: Conjunctivae and EOM are normal. Pupils are equal, round, and reactive to light. Right eye exhibits no discharge. Left eye exhibits no discharge. No scleral icterus.  Neck: Normal range of motion. Neck supple. No JVD present. No tracheal deviation present. No thyromegaly present.  Cardiovascular: Normal rate, regular rhythm, normal heart sounds and intact distal pulses.  Exam reveals no gallop and no friction rub.   No murmur heard. Pulmonary/Chest: Effort normal and breath sounds normal. No stridor. No respiratory distress. He has no wheezes. He has no rales. He exhibits no tenderness.  Abdominal: Soft. Bowel sounds are normal. He exhibits no distension and no mass. There is no tenderness. There is no rebound and no guarding.  Musculoskeletal: Normal range of motion. He exhibits no edema and no tenderness.  Lymphadenopathy:    He has no cervical adenopathy.  Neurological: He is alert and oriented to person, place, and time. He has normal strength and normal reflexes. He displays no atrophy, no tremor and normal reflexes. No cranial nerve deficit or sensory deficit. He exhibits normal muscle tone. He displays a negative Romberg sign. He  displays no seizure activity. Coordination and gait normal. He displays no Babinski's sign on the right side. He displays no Babinski's sign on the left side.  Skin: Skin is warm and dry. No rash noted. He is not diaphoretic. No erythema. No pallor.  Psychiatric: He has a normal mood and affect. His behavior is normal. Judgment and thought content normal.     Lab Results  Component Value Date   WBC 8.1 12/13/2012   HGB 15.6 12/13/2012   HCT 46.2 12/13/2012   PLT 394.0 12/13/2012   GLUCOSE 98 12/13/2012   CHOL 208* 12/13/2012   TRIG 120.0 12/13/2012   HDL 39.90 12/13/2012   LDLDIRECT 148.7 12/13/2012   LDLCALC 136* 04/15/2010   ALT 27 12/13/2012   AST 20 12/13/2012   NA 135 12/13/2012   K 4.0 12/13/2012   CL 94* 12/13/2012   CREATININE 1.1 12/13/2012   BUN 15 12/13/2012   CO2 33* 12/13/2012   TSH 0.55 12/13/2012   PSA 0.66 12/13/2012   INR 5.2 01/29/2013   HGBA1C  Value: 5.5 (NOTE)   The ADA recommends the following therapeutic goals for glycemic   control related to Hgb A1C measurement:   Goal of Therapy:   < 7.0% Hgb A1C   Action Suggested:  > 8.0% Hgb A1C   Ref:  Diabetes Care, 22, Suppl. 1, 1999 05/26/2007       Assessment & Plan:

## 2013-01-29 NOTE — Patient Instructions (Signed)

## 2013-01-30 NOTE — Assessment & Plan Note (Signed)
He will not take antidepressant and will not increase the dose of trazodone, for now he will continue klonopin as needed

## 2013-01-30 NOTE — Assessment & Plan Note (Signed)
He has a new onset chronic HA with no localizing s/s but since he is over the age of 12 I am concerned that there may me an aneurysm, mass, etc. So I have ordered an MRI/MRA. He will cont OTC pain meds for relief of this.

## 2013-02-07 ENCOUNTER — Ambulatory Visit (INDEPENDENT_AMBULATORY_CARE_PROVIDER_SITE_OTHER): Payer: Medicare Other | Admitting: Pharmacist

## 2013-02-07 DIAGNOSIS — I4891 Unspecified atrial fibrillation: Secondary | ICD-10-CM | POA: Diagnosis not present

## 2013-02-07 DIAGNOSIS — Z7901 Long term (current) use of anticoagulants: Secondary | ICD-10-CM | POA: Diagnosis not present

## 2013-02-07 LAB — POCT INR: INR: 2.3

## 2013-02-14 ENCOUNTER — Encounter: Payer: Self-pay | Admitting: Internal Medicine

## 2013-02-20 ENCOUNTER — Other Ambulatory Visit: Payer: Self-pay | Admitting: Physician Assistant

## 2013-02-22 ENCOUNTER — Ambulatory Visit (INDEPENDENT_AMBULATORY_CARE_PROVIDER_SITE_OTHER): Payer: Medicare Other | Admitting: Pharmacist

## 2013-02-22 DIAGNOSIS — Z7901 Long term (current) use of anticoagulants: Secondary | ICD-10-CM

## 2013-02-22 DIAGNOSIS — I4891 Unspecified atrial fibrillation: Secondary | ICD-10-CM | POA: Diagnosis not present

## 2013-02-22 DIAGNOSIS — Z5181 Encounter for therapeutic drug level monitoring: Secondary | ICD-10-CM

## 2013-02-22 LAB — POCT INR: INR: 3.6

## 2013-03-08 ENCOUNTER — Ambulatory Visit (INDEPENDENT_AMBULATORY_CARE_PROVIDER_SITE_OTHER): Payer: Medicare Other | Admitting: *Deleted

## 2013-03-08 DIAGNOSIS — I4891 Unspecified atrial fibrillation: Secondary | ICD-10-CM

## 2013-03-08 DIAGNOSIS — Z7901 Long term (current) use of anticoagulants: Secondary | ICD-10-CM

## 2013-03-08 DIAGNOSIS — Z5181 Encounter for therapeutic drug level monitoring: Secondary | ICD-10-CM

## 2013-03-08 LAB — POCT INR: INR: 3.3

## 2013-03-22 ENCOUNTER — Ambulatory Visit (INDEPENDENT_AMBULATORY_CARE_PROVIDER_SITE_OTHER): Payer: Medicare Other | Admitting: *Deleted

## 2013-03-22 DIAGNOSIS — Z5181 Encounter for therapeutic drug level monitoring: Secondary | ICD-10-CM

## 2013-03-22 DIAGNOSIS — Z7901 Long term (current) use of anticoagulants: Secondary | ICD-10-CM

## 2013-03-22 DIAGNOSIS — I4891 Unspecified atrial fibrillation: Secondary | ICD-10-CM | POA: Diagnosis not present

## 2013-03-22 LAB — POCT INR: INR: 2.5

## 2013-04-13 ENCOUNTER — Ambulatory Visit (INDEPENDENT_AMBULATORY_CARE_PROVIDER_SITE_OTHER): Payer: Medicare Other

## 2013-04-13 DIAGNOSIS — I4891 Unspecified atrial fibrillation: Secondary | ICD-10-CM | POA: Diagnosis not present

## 2013-04-13 DIAGNOSIS — Z5181 Encounter for therapeutic drug level monitoring: Secondary | ICD-10-CM | POA: Diagnosis not present

## 2013-04-13 DIAGNOSIS — Z7901 Long term (current) use of anticoagulants: Secondary | ICD-10-CM | POA: Diagnosis not present

## 2013-04-13 LAB — POCT INR: INR: 1.9

## 2013-04-25 ENCOUNTER — Other Ambulatory Visit: Payer: Self-pay | Admitting: Cardiology

## 2013-05-04 ENCOUNTER — Ambulatory Visit (INDEPENDENT_AMBULATORY_CARE_PROVIDER_SITE_OTHER): Payer: Medicare Other | Admitting: *Deleted

## 2013-05-04 DIAGNOSIS — I4891 Unspecified atrial fibrillation: Secondary | ICD-10-CM | POA: Diagnosis not present

## 2013-05-04 DIAGNOSIS — Z7901 Long term (current) use of anticoagulants: Secondary | ICD-10-CM

## 2013-05-04 DIAGNOSIS — Z5181 Encounter for therapeutic drug level monitoring: Secondary | ICD-10-CM | POA: Diagnosis not present

## 2013-05-04 LAB — POCT INR: INR: 2.2

## 2013-05-28 ENCOUNTER — Other Ambulatory Visit: Payer: Self-pay | Admitting: Cardiology

## 2013-06-01 ENCOUNTER — Ambulatory Visit (INDEPENDENT_AMBULATORY_CARE_PROVIDER_SITE_OTHER): Payer: Medicare Other | Admitting: Pharmacist

## 2013-06-01 DIAGNOSIS — I4891 Unspecified atrial fibrillation: Secondary | ICD-10-CM

## 2013-06-01 DIAGNOSIS — Z5181 Encounter for therapeutic drug level monitoring: Secondary | ICD-10-CM | POA: Diagnosis not present

## 2013-06-01 DIAGNOSIS — Z7901 Long term (current) use of anticoagulants: Secondary | ICD-10-CM | POA: Diagnosis not present

## 2013-06-01 LAB — POCT INR: INR: 1.8

## 2013-06-15 ENCOUNTER — Ambulatory Visit (INDEPENDENT_AMBULATORY_CARE_PROVIDER_SITE_OTHER): Payer: Medicare Other | Admitting: Pharmacist

## 2013-06-15 DIAGNOSIS — Z7901 Long term (current) use of anticoagulants: Secondary | ICD-10-CM

## 2013-06-15 DIAGNOSIS — I4891 Unspecified atrial fibrillation: Secondary | ICD-10-CM | POA: Diagnosis not present

## 2013-06-15 DIAGNOSIS — Z5181 Encounter for therapeutic drug level monitoring: Secondary | ICD-10-CM

## 2013-06-15 LAB — POCT INR: INR: 2.2

## 2013-06-20 ENCOUNTER — Ambulatory Visit (INDEPENDENT_AMBULATORY_CARE_PROVIDER_SITE_OTHER): Payer: Medicare Other | Admitting: Physician Assistant

## 2013-06-20 ENCOUNTER — Telehealth: Payer: Self-pay | Admitting: *Deleted

## 2013-06-20 ENCOUNTER — Encounter: Payer: Self-pay | Admitting: Physician Assistant

## 2013-06-20 VITALS — BP 102/78 | HR 69 | Ht 70.0 in | Wt 166.8 lb

## 2013-06-20 DIAGNOSIS — I10 Essential (primary) hypertension: Secondary | ICD-10-CM

## 2013-06-20 DIAGNOSIS — R634 Abnormal weight loss: Secondary | ICD-10-CM

## 2013-06-20 DIAGNOSIS — I4891 Unspecified atrial fibrillation: Secondary | ICD-10-CM | POA: Diagnosis not present

## 2013-06-20 DIAGNOSIS — F17200 Nicotine dependence, unspecified, uncomplicated: Secondary | ICD-10-CM

## 2013-06-20 DIAGNOSIS — E7800 Pure hypercholesterolemia, unspecified: Secondary | ICD-10-CM

## 2013-06-20 DIAGNOSIS — F32A Depression, unspecified: Secondary | ICD-10-CM

## 2013-06-20 DIAGNOSIS — F3289 Other specified depressive episodes: Secondary | ICD-10-CM

## 2013-06-20 DIAGNOSIS — F329 Major depressive disorder, single episode, unspecified: Secondary | ICD-10-CM

## 2013-06-20 LAB — TSH: TSH: 0.81 u[IU]/mL (ref 0.35–4.50)

## 2013-06-20 LAB — CBC WITH DIFFERENTIAL/PLATELET
Basophils Absolute: 0 10*3/uL (ref 0.0–0.1)
Basophils Relative: 0.4 % (ref 0.0–3.0)
Eosinophils Absolute: 0.1 10*3/uL (ref 0.0–0.7)
Eosinophils Relative: 1.4 % (ref 0.0–5.0)
HCT: 44.6 % (ref 39.0–52.0)
Hemoglobin: 15 g/dL (ref 13.0–17.0)
Lymphocytes Relative: 31.6 % (ref 12.0–46.0)
Lymphs Abs: 2.4 10*3/uL (ref 0.7–4.0)
MCHC: 33.6 g/dL (ref 30.0–36.0)
MCV: 95.5 fl (ref 78.0–100.0)
Monocytes Absolute: 0.5 10*3/uL (ref 0.1–1.0)
Monocytes Relative: 6.8 % (ref 3.0–12.0)
Neutro Abs: 4.5 10*3/uL (ref 1.4–7.7)
Neutrophils Relative %: 59.8 % (ref 43.0–77.0)
Platelets: 266 10*3/uL (ref 150.0–400.0)
RBC: 4.67 Mil/uL (ref 4.22–5.81)
RDW: 12.8 % (ref 11.5–15.5)
WBC: 7.5 10*3/uL (ref 4.0–10.5)

## 2013-06-20 LAB — BASIC METABOLIC PANEL
BUN: 14 mg/dL (ref 6–23)
CO2: 27 mEq/L (ref 19–32)
Calcium: 9.6 mg/dL (ref 8.4–10.5)
Chloride: 106 mEq/L (ref 96–112)
Creatinine, Ser: 0.9 mg/dL (ref 0.4–1.5)
GFR: 96.53 mL/min (ref >60.00)
Glucose, Bld: 83 mg/dL (ref 70–99)
Potassium: 4 mEq/L (ref 3.5–5.1)
Sodium: 141 mEq/L (ref 135–145)

## 2013-06-20 LAB — HEPATIC FUNCTION PANEL
ALT: 18 U/L (ref 0–53)
AST: 19 U/L (ref 0–37)
Albumin: 4.1 g/dL (ref 3.5–5.2)
Alkaline Phosphatase: 44 U/L (ref 39–117)
Bilirubin, Direct: 0.1 mg/dL (ref 0.0–0.3)
Total Bilirubin: 0.7 mg/dL (ref 0.2–1.2)
Total Protein: 6.6 g/dL (ref 6.0–8.3)

## 2013-06-20 NOTE — Patient Instructions (Signed)
STOP THE HCTZ   LAB WORK TODAY AT [facility];  BMET, CBC W/DIFF, TSH, LFT  CHEST X-RAY TODAY AT [facility].  YOU WILL NEED AN ECHO  YOU WILL NEED TO WEAR A HOLTER MONITOR 24 HOUR  MAKE SURE TO KEEP YOUR APPT WITH DR. CRENSHAW 07/09/13  MAKE SURE TO FOLLOW UP WITH YOUR PRIMARY CARE ABOUT DEPRESSION

## 2013-06-20 NOTE — Progress Notes (Signed)
[place], [address] [place], [place]  [address] Phone: [phone] Fax:  [phone]  Date:  06/20/2013   ID:  [name], DOB 02-07-1951, MRN [id number]  PCP:  Scarlette Calico, MD  Cardiologist:  Dr. Kirk Ruths      History of Present Illness: [name] is a 63 y.o. male with a hx of atrial fibrillation and prior TIA associated with this, HTN. In 2009 his LV function was normal and a previous nuclear study showed no scar or ischemia. Previous PFTs revealed moderate obstructive disease. Abdominal ultrasound in June of 2012 showed no anneurysm. Echocardiogram in November of 2012 showed normal LV function, moderate biatrial enlargement and mild mitral regurgitation. Holter monitor in December of 2012 showed mildly elevated rate and his Lopressor was increased.    Last seen by Dr. Stanford Breed 05/2012.  Unfortunately, his mother passed away in 11-20-2022. He has really struggled with depression since that time. He is not sleeping well. He is not eating well. He has lost a significant amount of weight. He is back to smoking 2 packs of cigarettes per day. He notes significant weakness. He has also been falling. He denies chest discomfort. He notes chronic dyspnea with exertion without significant change. He denies orthopnea or PND. He notes occasional pedal edema without significant change. He does feel his heart racing at times. He has never felt this in the past.   Studies:  - Echo (12/2010):  Mild to mod LVH, EF 65%, no RWMA, Ao sclerosis without AS, mild MR, mod BAE.  - Nuclear (05/2007):  EF 51%, no ischemia or infarct  - Abdominal US (07/2010):  No AAA   Recent Labs: 12/13/2012: ALT 27; Creatinine 1.1; Direct LDL 148.7; HDL Cholesterol 39.90; Hemoglobin 15.6; Potassium 4.0; TSH 0.55   Wt Readings from Last 3 Encounters:  06/20/13 166 lb 12.8 oz (75.66 kg)  01/29/13 190 lb 4 oz (86.297 kg)  01/17/13 194 lb (87.998 kg)     Past Medical History  Diagnosis Date  . Atrial  fibrillation   . Hypertension   . Hyperlipidemia   . COPD (chronic obstructive pulmonary disease)   . Amaurosis fugax of left eye     history of     Current Outpatient Prescriptions  Medication Sig Dispense Refill  . clonazePAM (KLONOPIN) 1 MG tablet Take 1 tablet (1 mg total) by mouth 3 (three) times daily.  75 tablet  2  . diltiazem (DILACOR XR) 240 MG 24 hr capsule TAKE ONE CAPSULE BY MOUTH ONCE DAILY  30 capsule  0  . hydrochlorothiazide (HYDRODIURIL) 25 MG tablet Take 1 tablet (25 mg total) by mouth daily.  30 tablet  6  . metoprolol (LOPRESSOR) 100 MG tablet Take 1 tablet (100 mg total) by mouth 2 (two) times daily.  60 tablet  6  . pravastatin (PRAVACHOL) 40 MG tablet TAKE ONE TABLET BY MOUTH ONCE DAILY  30 tablet  0  . traZODone (DESYREL) 50 MG tablet Take 0.5-1 tablets (25-50 mg total) by mouth at bedtime as needed for sleep.  90 tablet  3  . warfarin (COUMADIN) 5 MG tablet TAKE AS DIRECTED PER  ANTICOAGULATION  CLINIC  40 tablet  3   No current facility-administered medications for this visit.    Allergies:   Aspirin   Social History:  The patient  reports that he has been smoking Cigarettes.  He has a 1 pack-year smoking history. He has never used smokeless tobacco. He reports that he does  not drink alcohol or use illicit drugs.   Family History:  The patient's family history includes Cancer in his father, mother, and sister; Hypertension in his mother. There is no history of Early death, Heart disease, Hyperlipidemia, Kidney disease, or Stroke.   ROS:  Please see the history of present illness.   He admits to having night sweats. He also has a chronic cough with clear sputum. He denies hemoptysis. He sometimes feels lightheaded in the mornings. He also notes significant headaches. His PCP recommended an MRI. However, the patient did not schedule this.   All other systems reviewed and negative.   PHYSICAL EXAM: VS:  BP 102/78  Pulse 69  Ht 5\' 10"  (1.778 m)  Wt 166 lb 12.8  oz (75.66 kg)  BMI 23.93 kg/m2 Well nourished, well developed, in no acute distress HEENT: normal Neck: no JVD Endocrine: No thyromegaly Cardiac:  normal S1, S2; irregularly irregular rhythm; no murmur Lungs:  clear to auscultation bilaterally, no wheezing, rhonchi or rales Abd: soft, nontender, no hepatomegaly Ext: no edema Skin: warm and dry Neuro:  CNs 2-12 intact, no focal abnormalities noted  EKG:  Atrial fibrillation, HR 69, normal axis, no ST changes     ASSESSMENT AND PLAN:  1. Atrial fibrillation: His rate remains controlled. He is currently tolerating Coumadin. He does admit to recent falls. At this point, I think it is acceptable for him to remain on Coumadin. However, we will need to follow him closely to ensure that he is a good candidate. He does note increased palpitations. He has never felt this before with his atrial fibrillation. I suspect it is more related to depression than anything else. I will arrange 24-hour Holter to assess his heart rate. I will also arrange an echocardiogram. 2. HYPERTENSION, BENIGN ESSENTIAL: Controlled. Blood pressure is actually running somewhat low. He has been lightheaded at times. He is not eating very well. I suspect he does not need as much medication as he once did. I will stop his hydrochlorothiazide. Check a basic metabolic panel today. 3. HYPERCHOLESTEROLEMIA-PURE: Continue statin. Obtain LFTs. 4. TOBACCO ABUSE: He knows that he needs to quit. 5. Weight loss: I suspect that this is related to poor diet in the setting of uncontrolled depression. However, he is a smoker with significant weight loss. I will obtain a TSH, basic metabolic panel, LFTs, CBC and chest x-ray. 6. Depression:  I have asked him to arrange followup with his primary care physician for further management. 7. Disposition: Keep followup with Dr. Stanford Breed next month as planned.  Signed, Richardson Dopp, PA-C  06/20/2013 10:55 AM

## 2013-06-20 NOTE — Telephone Encounter (Signed)
pt notified about lab results and will go get cxr tomorrow at [facility]. Pt verbalized understanding to results and Plan of Care.

## 2013-06-27 ENCOUNTER — Telehealth: Payer: Self-pay | Admitting: *Deleted

## 2013-06-27 ENCOUNTER — Ambulatory Visit (INDEPENDENT_AMBULATORY_CARE_PROVIDER_SITE_OTHER)
Admission: RE | Admit: 2013-06-27 | Discharge: 2013-06-27 | Disposition: A | Discharge status: Home / Self Care | Payer: Medicare Other | Source: Ambulatory Visit | Attending: Physician Assistant | Admitting: Physician Assistant

## 2013-06-27 DIAGNOSIS — H02839 Dermatochalasis of unspecified eye, unspecified eyelid: Secondary | ICD-10-CM | POA: Diagnosis not present

## 2013-06-27 DIAGNOSIS — F17200 Nicotine dependence, unspecified, uncomplicated: Secondary | ICD-10-CM | POA: Diagnosis not present

## 2013-06-27 DIAGNOSIS — H2510 Age-related nuclear cataract, unspecified eye: Secondary | ICD-10-CM | POA: Diagnosis not present

## 2013-06-27 DIAGNOSIS — J449 Chronic obstructive pulmonary disease, unspecified: Secondary | ICD-10-CM | POA: Diagnosis not present

## 2013-06-27 DIAGNOSIS — H3400 Transient retinal artery occlusion, unspecified eye: Secondary | ICD-10-CM | POA: Diagnosis not present

## 2013-06-27 DIAGNOSIS — H348392 Tributary (branch) retinal vein occlusion, unspecified eye, stable: Secondary | ICD-10-CM | POA: Diagnosis not present

## 2013-06-27 NOTE — Telephone Encounter (Signed)
lmptcb for cxr results 

## 2013-06-28 DIAGNOSIS — R634 Abnormal weight loss: Secondary | ICD-10-CM | POA: Diagnosis not present

## 2013-06-28 DIAGNOSIS — Z1331 Encounter for screening for depression: Secondary | ICD-10-CM | POA: Diagnosis not present

## 2013-06-28 DIAGNOSIS — M545 Low back pain, unspecified: Secondary | ICD-10-CM | POA: Diagnosis not present

## 2013-06-28 DIAGNOSIS — M5137 Other intervertebral disc degeneration, lumbosacral region: Secondary | ICD-10-CM | POA: Diagnosis not present

## 2013-06-28 DIAGNOSIS — I10 Essential (primary) hypertension: Secondary | ICD-10-CM | POA: Diagnosis not present

## 2013-06-28 DIAGNOSIS — I6990 Unspecified sequelae of unspecified cerebrovascular disease: Secondary | ICD-10-CM | POA: Diagnosis not present

## 2013-06-28 DIAGNOSIS — R51 Headache: Secondary | ICD-10-CM | POA: Diagnosis not present

## 2013-06-28 DIAGNOSIS — J449 Chronic obstructive pulmonary disease, unspecified: Secondary | ICD-10-CM | POA: Diagnosis not present

## 2013-06-28 DIAGNOSIS — Z7901 Long term (current) use of anticoagulants: Secondary | ICD-10-CM | POA: Diagnosis not present

## 2013-06-28 DIAGNOSIS — I4891 Unspecified atrial fibrillation: Secondary | ICD-10-CM | POA: Diagnosis not present

## 2013-06-28 MED ORDER — PRAVASTATIN SODIUM 40 MG PO TABS: 40.0000 mg | ORAL_TABLET | Freq: Every day | ORAL | Status: DC

## 2013-06-28 MED ORDER — DILTIAZEM HCL ER 240 MG PO CP24: 240.0000 mg | ORAL_CAPSULE | Freq: Every day | ORAL | Status: DC

## 2013-06-28 NOTE — Telephone Encounter (Signed)
pt aware of results of cxr

## 2013-06-28 NOTE — Telephone Encounter (Signed)
Follow up     Pt returning this offices call for his results.  Cell  # [phone]

## 2013-06-29 ENCOUNTER — Other Ambulatory Visit: Payer: Self-pay | Admitting: Physician Assistant

## 2013-07-03 ENCOUNTER — Other Ambulatory Visit: Payer: Self-pay | Admitting: Internal Medicine

## 2013-07-03 DIAGNOSIS — R519 Headache, unspecified: Secondary | ICD-10-CM

## 2013-07-03 DIAGNOSIS — M549 Dorsalgia, unspecified: Secondary | ICD-10-CM

## 2013-07-03 DIAGNOSIS — R51 Headache: Principal | ICD-10-CM

## 2013-07-09 ENCOUNTER — Other Ambulatory Visit: Payer: Self-pay | Admitting: Internal Medicine

## 2013-07-09 ENCOUNTER — Encounter: Payer: Self-pay | Admitting: *Deleted

## 2013-07-09 ENCOUNTER — Encounter (INDEPENDENT_AMBULATORY_CARE_PROVIDER_SITE_OTHER): Payer: Medicare Other

## 2013-07-09 ENCOUNTER — Ambulatory Visit (INDEPENDENT_AMBULATORY_CARE_PROVIDER_SITE_OTHER): Payer: Medicare Other | Admitting: *Deleted

## 2013-07-09 ENCOUNTER — Ambulatory Visit (INDEPENDENT_AMBULATORY_CARE_PROVIDER_SITE_OTHER): Payer: Medicare Other | Admitting: Cardiology

## 2013-07-09 ENCOUNTER — Ambulatory Visit (HOSPITAL_COMMUNITY): Payer: Medicare Other | Attending: Cardiovascular Disease | Admitting: Radiology

## 2013-07-09 ENCOUNTER — Encounter: Payer: Self-pay | Admitting: Cardiology

## 2013-07-09 VITALS — BP 100/70 | HR 64 | Ht 70.0 in | Wt 162.0 lb

## 2013-07-09 DIAGNOSIS — I059 Rheumatic mitral valve disease, unspecified: Secondary | ICD-10-CM | POA: Insufficient documentation

## 2013-07-09 DIAGNOSIS — I4891 Unspecified atrial fibrillation: Secondary | ICD-10-CM | POA: Insufficient documentation

## 2013-07-09 DIAGNOSIS — Z139 Encounter for screening, unspecified: Secondary | ICD-10-CM

## 2013-07-09 DIAGNOSIS — F17200 Nicotine dependence, unspecified, uncomplicated: Secondary | ICD-10-CM | POA: Diagnosis not present

## 2013-07-09 DIAGNOSIS — E7800 Pure hypercholesterolemia, unspecified: Secondary | ICD-10-CM | POA: Diagnosis not present

## 2013-07-09 DIAGNOSIS — I10 Essential (primary) hypertension: Secondary | ICD-10-CM

## 2013-07-09 DIAGNOSIS — Z7901 Long term (current) use of anticoagulants: Secondary | ICD-10-CM | POA: Diagnosis not present

## 2013-07-09 DIAGNOSIS — I517 Cardiomegaly: Secondary | ICD-10-CM | POA: Insufficient documentation

## 2013-07-09 DIAGNOSIS — Z5181 Encounter for therapeutic drug level monitoring: Secondary | ICD-10-CM

## 2013-07-09 LAB — POCT INR: INR: 2.1

## 2013-07-09 NOTE — Assessment & Plan Note (Signed)
Blood pressure controlled. Continue present medications. 

## 2013-07-09 NOTE — Progress Notes (Signed)
Patient ID: [name], male   DOB: [birth year], 63 y.o.   MRN: [id number] E-Cardio 24 hour holter monitor applied to patient.

## 2013-07-09 NOTE — Progress Notes (Signed)
Echocardiogram performed.  

## 2013-07-09 NOTE — Assessment & Plan Note (Signed)
Continue present medications for rate control. Continue Coumadin. He is scheduled for an echocardiogram and Holter monitor today.

## 2013-07-09 NOTE — Assessment & Plan Note (Signed)
Patient counseled on discontinuing. 

## 2013-07-09 NOTE — Progress Notes (Signed)
      HPI: FU atrial fibrillation (h/o TIA) and hypertension. Holter monitor in December of 2012 showed mildly elevated rate and his Lopressor was increased.  Studies:  - Echo (12/2010): Mild to mod LVH, EF 65%, no RWMA, Ao sclerosis without AS, mild MR, mod BAE.  - Nuclear (05/2007): EF 51%, no ischemia or infarct  - Abdominal US (07/2010): No AAA  Patient seen earlier this month an echocardiogram and Holter monitor ordered. Results pending. Patient does have dyspnea on exertion. No orthopnea, PND, pedal edema, syncope or chest pain. Describes fatigue.    Current Outpatient Prescriptions  Medication Sig Dispense Refill  . clonazePAM (KLONOPIN) 1 MG tablet Take 1 tablet (1 mg total) by mouth 3 (three) times daily.  75 tablet  2  . diltiazem (DILACOR XR) 240 MG 24 hr capsule Take 1 capsule (240 mg total) by mouth daily.  30 capsule  12  . pravastatin (PRAVACHOL) 40 MG tablet Take 1 tablet (40 mg total) by mouth daily.  30 tablet  12  . traZODone (DESYREL) 50 MG tablet Take 0.5-1 tablets (25-50 mg total) by mouth at bedtime as needed for sleep.  90 tablet  3  . warfarin (COUMADIN) 5 MG tablet TAKE AS DIRECTED PER  ANTICOAGULATION  CLINIC  40 tablet  3   No current facility-administered medications for this visit.     Past Medical History  Diagnosis Date  . Atrial fibrillation   . Hypertension   . Hyperlipidemia   . COPD (chronic obstructive pulmonary disease)   . Amaurosis fugax of left eye     history of     History reviewed. No pertinent past surgical history.  History   Social History  . Marital Status: Divorced    Spouse Name: N/A    Number of Children: N/A  . 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 Smoking status: Current Some Day Smoker -- 2.00 packs/day for .5 years    Types: Cigarettes  . Smokeless tobacco: Never Used  . Alcohol Use: No  . Drug Use: No  . Sexual Activity: Not Currently   Other  Topics Concern  . Not on file   Social History Narrative  . No narrative on file    ROS: Fatigue, depression and headaches but no fevers or chills, productive cough, hemoptysis, dysphasia, odynophagia, melena, hematochezia, dysuria, hematuria, rash, seizure activity, orthopnea, PND, pedal edema, claudication. Remaining systems are negative.  Physical Exam: Well-developed well-nourished in no acute distress.  Skin is warm and dry.  HEENT is normal.  Neck is supple.  Chest is clear to auscultation with normal expansion.  Cardiovascular exam is irregular Abdominal exam nontender or distended. No masses palpated. Extremities show no edema. neuro grossly intact

## 2013-07-09 NOTE — Assessment & Plan Note (Signed)
Management per primary care. 

## 2013-07-09 NOTE — Patient Instructions (Signed)
Your physician wants you to follow-up in: [place] will receive a reminder letter in the mail two months in advance. If you don't receive a letter, please call our office to schedule the follow-up appointment.    CALL WITH THE NAME OF YOUR MEDICINES

## 2013-07-10 ENCOUNTER — Encounter: Payer: Self-pay | Admitting: Physician Assistant

## 2013-07-10 ENCOUNTER — Other Ambulatory Visit: Payer: Medicare Other

## 2013-07-11 ENCOUNTER — Telehealth: Payer: Self-pay | Admitting: *Deleted

## 2013-07-11 MED ORDER — SERTRALINE HCL 50 MG PO TABS: 50.0000 mg | ORAL_TABLET | Freq: Every day | ORAL | Status: DC

## 2013-07-11 MED ORDER — GABAPENTIN 100 MG PO CAPS: 100.0000 mg | ORAL_CAPSULE | Freq: Every day | ORAL | Status: DC

## 2013-07-11 NOTE — Telephone Encounter (Signed)
pt notified about echo results. Pt states he s/w Hilda Blades, RN for Dr. Stanford Breed the other day and was told tcb with the list of meds he is taking. Pt was on metoprolol 5/13 at [facility], [place] ov, but then 6/1 ov w/Dr. Stanford Breed metoprolol off med list, pt confused should he be on metoprolol. I looked at med history and looks like metoprolol was d/c'd in error by CMA and was not put back on med list. I explained to pt that I did not see where Dr. Stanford Breed or Richardson Dopp, [facility] had d/c'd metoprolol however. I stated I am pretty sure he should be taking the metoprolol however; let me d/w PA and cb later with recommendation. Pt said ok and thank and if not home ok to lmom with recommendations.

## 2013-07-12 MED ORDER — METOPROLOL TARTRATE 100 MG PO TABS: 100.0000 mg | ORAL_TABLET | Freq: Two times a day (BID) | ORAL | Status: DC

## 2013-07-12 NOTE — Telephone Encounter (Signed)
lmptcb, I d/w Nicki Reaper W. PA about metoprolol question pt had, per [employer]. PA pt is to stay on metoprolol.

## 2013-07-12 NOTE — Telephone Encounter (Signed)
pt notified per Richardson Dopp, PAC to resume the Lopressor 100 mg BID. Med was accidentally removed on 07/09/13 when pt saw Dr. Stanford Breed. Pt verbalized Plan of Care.

## 2013-07-16 ENCOUNTER — Other Ambulatory Visit: Payer: Medicare Other

## 2013-07-17 ENCOUNTER — Ambulatory Visit
Admission: RE | Admit: 2013-07-17 | Discharge: 2013-07-17 | Disposition: A | Discharge status: Home / Self Care | Payer: Medicare Other | Source: Ambulatory Visit | Attending: Internal Medicine | Admitting: Internal Medicine

## 2013-07-17 DIAGNOSIS — Z135 Encounter for screening for eye and ear disorders: Secondary | ICD-10-CM | POA: Diagnosis not present

## 2013-07-17 DIAGNOSIS — R519 Headache, unspecified: Secondary | ICD-10-CM

## 2013-07-17 DIAGNOSIS — R51 Headache: Principal | ICD-10-CM

## 2013-07-17 DIAGNOSIS — Z139 Encounter for screening, unspecified: Secondary | ICD-10-CM

## 2013-07-17 DIAGNOSIS — G9389 Other specified disorders of brain: Secondary | ICD-10-CM | POA: Diagnosis not present

## 2013-07-27 ENCOUNTER — Telehealth: Payer: Self-pay | Admitting: *Deleted

## 2013-07-27 NOTE — Telephone Encounter (Signed)
Spoke with pt, Monitor reviewed by dr Stanford Breed shows atrial fib with PVC's or aberrantly conducted beats. Rate controlled.

## 2013-08-06 ENCOUNTER — Ambulatory Visit (INDEPENDENT_AMBULATORY_CARE_PROVIDER_SITE_OTHER): Payer: Medicare Other | Admitting: Pharmacist

## 2013-08-06 DIAGNOSIS — I4891 Unspecified atrial fibrillation: Secondary | ICD-10-CM

## 2013-08-06 DIAGNOSIS — Z7901 Long term (current) use of anticoagulants: Secondary | ICD-10-CM

## 2013-08-06 DIAGNOSIS — Z5181 Encounter for therapeutic drug level monitoring: Secondary | ICD-10-CM | POA: Diagnosis not present

## 2013-08-06 LAB — POCT INR: INR: 2.1

## 2013-08-13 ENCOUNTER — Other Ambulatory Visit: Payer: Self-pay | Admitting: Internal Medicine

## 2013-08-13 DIAGNOSIS — R634 Abnormal weight loss: Secondary | ICD-10-CM | POA: Diagnosis not present

## 2013-08-13 DIAGNOSIS — J449 Chronic obstructive pulmonary disease, unspecified: Secondary | ICD-10-CM | POA: Diagnosis not present

## 2013-08-13 DIAGNOSIS — F17200 Nicotine dependence, unspecified, uncomplicated: Secondary | ICD-10-CM | POA: Diagnosis not present

## 2013-08-13 DIAGNOSIS — F329 Major depressive disorder, single episode, unspecified: Secondary | ICD-10-CM | POA: Diagnosis not present

## 2013-08-13 DIAGNOSIS — R109 Unspecified abdominal pain: Secondary | ICD-10-CM | POA: Diagnosis not present

## 2013-08-13 DIAGNOSIS — H9319 Tinnitus, unspecified ear: Secondary | ICD-10-CM | POA: Diagnosis not present

## 2013-08-13 DIAGNOSIS — R1084 Generalized abdominal pain: Secondary | ICD-10-CM

## 2013-08-13 DIAGNOSIS — I10 Essential (primary) hypertension: Secondary | ICD-10-CM | POA: Diagnosis not present

## 2013-08-13 DIAGNOSIS — R110 Nausea: Secondary | ICD-10-CM

## 2013-08-13 DIAGNOSIS — H6120 Impacted cerumen, unspecified ear: Secondary | ICD-10-CM | POA: Diagnosis not present

## 2013-08-14 ENCOUNTER — Ambulatory Visit
Admission: RE | Admit: 2013-08-14 | Discharge: 2013-08-14 | Disposition: A | Discharge status: Home / Self Care | Payer: Medicare Other | Source: Ambulatory Visit | Attending: Internal Medicine | Admitting: Internal Medicine

## 2013-08-14 DIAGNOSIS — R1084 Generalized abdominal pain: Secondary | ICD-10-CM

## 2013-08-14 DIAGNOSIS — K862 Cyst of pancreas: Secondary | ICD-10-CM | POA: Diagnosis not present

## 2013-08-14 DIAGNOSIS — K863 Pseudocyst of pancreas: Secondary | ICD-10-CM | POA: Diagnosis not present

## 2013-08-14 DIAGNOSIS — R634 Abnormal weight loss: Secondary | ICD-10-CM

## 2013-08-14 DIAGNOSIS — R110 Nausea: Secondary | ICD-10-CM

## 2013-08-14 MED ORDER — IOHEXOL 300 MG/ML  SOLN
100.0000 mL | Freq: Once | INTRAMUSCULAR | Status: AC | PRN
  Administered 2013-08-14: 100 mL via INTRAVENOUS

## 2013-09-04 DIAGNOSIS — M1990 Unspecified osteoarthritis, unspecified site: Secondary | ICD-10-CM | POA: Diagnosis not present

## 2013-09-04 DIAGNOSIS — I10 Essential (primary) hypertension: Secondary | ICD-10-CM | POA: Diagnosis not present

## 2013-09-04 DIAGNOSIS — I700 Atherosclerosis of aorta: Secondary | ICD-10-CM | POA: Diagnosis not present

## 2013-09-04 DIAGNOSIS — I4891 Unspecified atrial fibrillation: Secondary | ICD-10-CM | POA: Diagnosis not present

## 2013-09-04 DIAGNOSIS — E785 Hyperlipidemia, unspecified: Secondary | ICD-10-CM | POA: Diagnosis not present

## 2013-09-04 DIAGNOSIS — R109 Unspecified abdominal pain: Secondary | ICD-10-CM | POA: Diagnosis not present

## 2013-09-04 DIAGNOSIS — M545 Low back pain, unspecified: Secondary | ICD-10-CM | POA: Diagnosis not present

## 2013-09-04 DIAGNOSIS — Z79899 Other long term (current) drug therapy: Secondary | ICD-10-CM | POA: Diagnosis not present

## 2013-09-05 ENCOUNTER — Encounter: Payer: Self-pay | Admitting: Cardiology

## 2013-09-17 ENCOUNTER — Ambulatory Visit (INDEPENDENT_AMBULATORY_CARE_PROVIDER_SITE_OTHER): Payer: Medicare Other | Admitting: Pharmacist

## 2013-09-17 DIAGNOSIS — I4891 Unspecified atrial fibrillation: Secondary | ICD-10-CM | POA: Diagnosis not present

## 2013-09-17 DIAGNOSIS — Z7901 Long term (current) use of anticoagulants: Secondary | ICD-10-CM

## 2013-09-17 DIAGNOSIS — Z5181 Encounter for therapeutic drug level monitoring: Secondary | ICD-10-CM | POA: Diagnosis not present

## 2013-09-17 LAB — POCT INR: INR: 1.9

## 2013-10-16 ENCOUNTER — Ambulatory Visit (INDEPENDENT_AMBULATORY_CARE_PROVIDER_SITE_OTHER): Payer: Medicare Other | Admitting: *Deleted

## 2013-10-16 DIAGNOSIS — I4891 Unspecified atrial fibrillation: Secondary | ICD-10-CM

## 2013-10-16 DIAGNOSIS — Z5181 Encounter for therapeutic drug level monitoring: Secondary | ICD-10-CM

## 2013-10-16 DIAGNOSIS — Z7901 Long term (current) use of anticoagulants: Secondary | ICD-10-CM | POA: Diagnosis not present

## 2013-10-16 LAB — POCT INR: INR: 1.8

## 2013-10-22 ENCOUNTER — Telehealth: Payer: Self-pay

## 2013-10-22 MED ORDER — WARFARIN SODIUM 5 MG PO TABS: ORAL_TABLET | ORAL | Status: DC

## 2013-10-22 NOTE — Telephone Encounter (Signed)
Refill done as requested 

## 2013-10-30 ENCOUNTER — Ambulatory Visit (INDEPENDENT_AMBULATORY_CARE_PROVIDER_SITE_OTHER): Payer: Medicare Other

## 2013-10-30 DIAGNOSIS — Z5181 Encounter for therapeutic drug level monitoring: Secondary | ICD-10-CM

## 2013-10-30 DIAGNOSIS — Z7901 Long term (current) use of anticoagulants: Secondary | ICD-10-CM | POA: Diagnosis not present

## 2013-10-30 DIAGNOSIS — I4891 Unspecified atrial fibrillation: Secondary | ICD-10-CM

## 2013-10-30 LAB — POCT INR: INR: 2.5

## 2013-11-20 ENCOUNTER — Ambulatory Visit (INDEPENDENT_AMBULATORY_CARE_PROVIDER_SITE_OTHER): Payer: Medicare Other

## 2013-11-20 DIAGNOSIS — Z7901 Long term (current) use of anticoagulants: Secondary | ICD-10-CM

## 2013-11-20 DIAGNOSIS — Z5181 Encounter for therapeutic drug level monitoring: Secondary | ICD-10-CM

## 2013-11-20 DIAGNOSIS — I4891 Unspecified atrial fibrillation: Secondary | ICD-10-CM

## 2013-11-20 LAB — POCT INR: INR: 2.1

## 2013-12-04 DIAGNOSIS — F329 Major depressive disorder, single episode, unspecified: Secondary | ICD-10-CM | POA: Diagnosis not present

## 2013-12-04 DIAGNOSIS — I10 Essential (primary) hypertension: Secondary | ICD-10-CM | POA: Diagnosis not present

## 2013-12-04 DIAGNOSIS — Z23 Encounter for immunization: Secondary | ICD-10-CM | POA: Diagnosis not present

## 2013-12-04 DIAGNOSIS — E785 Hyperlipidemia, unspecified: Secondary | ICD-10-CM | POA: Diagnosis not present

## 2013-12-04 DIAGNOSIS — J449 Chronic obstructive pulmonary disease, unspecified: Secondary | ICD-10-CM | POA: Diagnosis not present

## 2013-12-04 DIAGNOSIS — Z7901 Long term (current) use of anticoagulants: Secondary | ICD-10-CM | POA: Diagnosis not present

## 2013-12-04 DIAGNOSIS — I480 Paroxysmal atrial fibrillation: Secondary | ICD-10-CM | POA: Diagnosis not present

## 2013-12-18 ENCOUNTER — Ambulatory Visit (INDEPENDENT_AMBULATORY_CARE_PROVIDER_SITE_OTHER): Payer: Medicare Other | Admitting: *Deleted

## 2013-12-18 DIAGNOSIS — Z7901 Long term (current) use of anticoagulants: Secondary | ICD-10-CM

## 2013-12-18 DIAGNOSIS — Z5181 Encounter for therapeutic drug level monitoring: Secondary | ICD-10-CM

## 2013-12-18 DIAGNOSIS — I4891 Unspecified atrial fibrillation: Secondary | ICD-10-CM

## 2013-12-18 LAB — POCT INR: INR: 1.7

## 2014-01-01 ENCOUNTER — Ambulatory Visit (INDEPENDENT_AMBULATORY_CARE_PROVIDER_SITE_OTHER): Payer: Medicare Other

## 2014-01-01 DIAGNOSIS — Z7901 Long term (current) use of anticoagulants: Secondary | ICD-10-CM | POA: Diagnosis not present

## 2014-01-01 DIAGNOSIS — Z5181 Encounter for therapeutic drug level monitoring: Secondary | ICD-10-CM | POA: Diagnosis not present

## 2014-01-01 DIAGNOSIS — I4891 Unspecified atrial fibrillation: Secondary | ICD-10-CM | POA: Diagnosis not present

## 2014-01-01 LAB — POCT INR: INR: 2

## 2014-01-04 ENCOUNTER — Telehealth: Payer: Self-pay

## 2014-01-04 NOTE — Telephone Encounter (Signed)
Pharmacist called to let us know that patient changed manufaturer on warfarin.

## 2014-02-12 ENCOUNTER — Ambulatory Visit (INDEPENDENT_AMBULATORY_CARE_PROVIDER_SITE_OTHER): Payer: Commercial Managed Care - HMO | Admitting: Pharmacist

## 2014-02-12 DIAGNOSIS — Z7901 Long term (current) use of anticoagulants: Secondary | ICD-10-CM

## 2014-02-12 DIAGNOSIS — I4891 Unspecified atrial fibrillation: Secondary | ICD-10-CM

## 2014-02-12 DIAGNOSIS — Z5181 Encounter for therapeutic drug level monitoring: Secondary | ICD-10-CM

## 2014-02-12 LAB — POCT INR: INR: 2.4

## 2014-02-21 ENCOUNTER — Other Ambulatory Visit: Payer: Self-pay

## 2014-02-21 MED ORDER — DILTIAZEM HCL ER 240 MG PO CP24: 240.0000 mg | ORAL_CAPSULE | Freq: Every day | ORAL | Status: DC

## 2014-02-21 MED ORDER — METOPROLOL TARTRATE 100 MG PO TABS: 100.0000 mg | ORAL_TABLET | Freq: Two times a day (BID) | ORAL | Status: DC

## 2014-02-22 ENCOUNTER — Other Ambulatory Visit: Payer: Self-pay | Admitting: *Deleted

## 2014-02-22 MED ORDER — WARFARIN SODIUM 5 MG PO TABS: ORAL_TABLET | ORAL | Status: DC

## 2014-03-06 DIAGNOSIS — I10 Essential (primary) hypertension: Secondary | ICD-10-CM | POA: Diagnosis not present

## 2014-03-11 ENCOUNTER — Encounter: Payer: Self-pay | Admitting: Internal Medicine

## 2014-03-13 NOTE — Progress Notes (Signed)
      HPI: FU atrial fibrillation (h/o TIA) and hypertension. Holter monitor in June 2015 showed rate controlled atrial fibrillation. since last seen he has some dyspnea on exertion but no orthopnea, PND, pedal edema, palpitations, syncope or chest pain. Studies:  - Echo (07/09/13): Normal LV function, severe LAE, moderate RAE; mild MR - Nuclear (05/2007): EF 51%, no ischemia or infarct  - Abdominal US (07/2010): No AAA    Current Outpatient Prescriptions  Medication Sig Dispense Refill  . clonazePAM (KLONOPIN) 1 MG tablet Take 1 tablet (1 mg total) by mouth 3 (three) times daily. 75 tablet 2  . diltiazem (DILACOR XR) 240 MG 24 hr capsule Take 1 capsule (240 mg total) by mouth daily. 90 capsule 2  . gabapentin (NEURONTIN) 100 MG capsule Take 1 capsule (100 mg total) by mouth at bedtime.    . metoprolol (LOPRESSOR) 100 MG tablet Take 1 tablet (100 mg total) by mouth 2 (two) times daily. 180 tablet 1  . pravastatin (PRAVACHOL) 40 MG tablet Take 1 tablet (40 mg total) by mouth daily. 30 tablet 12  . sertraline (ZOLOFT) 50 MG tablet Take 1 tablet (50 mg total) by mouth at bedtime.    . traMADol (ULTRAM) 50 MG tablet Take 50 mg by mouth every 6 (six) hours as needed.    . warfarin (COUMADIN) 5 MG tablet Take as directed by coumadin clinic 40 tablet 3   No current facility-administered medications for this visit.     Past Medical History  Diagnosis Date  . Atrial fibrillation   . Hypertension   . Hyperlipidemia   . COPD (chronic obstructive pulmonary disease)   . Amaurosis fugax of left eye     history of   . Hx of echocardiogram     Echo (07/09/13):  Mild LVH, EF 55-60%, MAC, mild MR, severe LAE, mod RAE    History reviewed. No pertinent past surgical history.  History   Social History  . Marital Status: Divorced    Spouse Name: N/A    Number of Children: N/A  . 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 Smoking  status: Current Every Day Smoker -- 2.00 packs/day    Types: Cigarettes  . Smokeless tobacco: Never Used  . Alcohol Use: No  . Drug Use: No  . Sexual Activity: Not Currently   Other Topics Concern  . Not on file   Social History Narrative    ROS: no fevers or chills, productive cough, hemoptysis, dysphasia, odynophagia, melena, hematochezia, dysuria, hematuria, rash, seizure activity, orthopnea, PND, pedal edema, claudication. Remaining systems are negative.  Physical Exam: Well-developed well-nourished in no acute distress.  Skin is warm and dry.  HEENT is normal.  Neck is supple.  Chest with diminished breath sounds Cardiovascular exam is irregular Abdominal exam nontender or distended. No masses palpated. Extremities show no edema. neuro grossly intact  ECG atrial fibrillation at a rate of 65. No ST changes.

## 2014-03-14 ENCOUNTER — Ambulatory Visit (INDEPENDENT_AMBULATORY_CARE_PROVIDER_SITE_OTHER): Payer: Commercial Managed Care - HMO | Admitting: Cardiology

## 2014-03-14 ENCOUNTER — Ambulatory Visit (INDEPENDENT_AMBULATORY_CARE_PROVIDER_SITE_OTHER): Payer: Commercial Managed Care - HMO | Admitting: *Deleted

## 2014-03-14 ENCOUNTER — Encounter: Payer: Self-pay | Admitting: Cardiology

## 2014-03-14 VITALS — BP 130/90 | HR 65 | Ht 70.0 in | Wt 182.7 lb

## 2014-03-14 DIAGNOSIS — I4891 Unspecified atrial fibrillation: Secondary | ICD-10-CM

## 2014-03-14 DIAGNOSIS — I10 Essential (primary) hypertension: Secondary | ICD-10-CM

## 2014-03-14 DIAGNOSIS — Z720 Tobacco use: Secondary | ICD-10-CM

## 2014-03-14 DIAGNOSIS — F17200 Nicotine dependence, unspecified, uncomplicated: Secondary | ICD-10-CM

## 2014-03-14 DIAGNOSIS — E7800 Pure hypercholesterolemia, unspecified: Secondary | ICD-10-CM

## 2014-03-14 DIAGNOSIS — Z5181 Encounter for therapeutic drug level monitoring: Secondary | ICD-10-CM

## 2014-03-14 DIAGNOSIS — Z7901 Long term (current) use of anticoagulants: Secondary | ICD-10-CM

## 2014-03-14 LAB — CBC
HEMATOCRIT: 44.5 % (ref 39.0–52.0)
Hemoglobin: 15.4 g/dL (ref 13.0–17.0)
MCH: 31 pg (ref 26.0–34.0)
MCHC: 34.6 g/dL (ref 30.0–36.0)
MCV: 89.5 fL (ref 78.0–100.0)
MPV: 9.4 fL (ref 8.6–12.4)
PLATELETS: 327 10*3/uL (ref 150–400)
RBC: 4.97 MIL/uL (ref 4.22–5.81)
RDW: 13.5 % (ref 11.5–15.5)
WBC: 9.4 10*3/uL (ref 4.0–10.5)

## 2014-03-14 LAB — BASIC METABOLIC PANEL WITH GFR
BUN: 9 mg/dL (ref 6–23)
CO2: 26 mEq/L (ref 19–32)
CREATININE: 0.87 mg/dL (ref 0.50–1.35)
Calcium: 9.4 mg/dL (ref 8.4–10.5)
Chloride: 105 mEq/L (ref 96–112)
Glucose, Bld: 89 mg/dL (ref 70–99)
POTASSIUM: 4.3 meq/L (ref 3.5–5.3)
Sodium: 141 mEq/L (ref 135–145)

## 2014-03-14 LAB — POCT INR: INR: 3

## 2014-03-14 NOTE — Assessment & Plan Note (Signed)
Patient counseled on discontinuing. 

## 2014-03-14 NOTE — Assessment & Plan Note (Addendum)
Patient remains in permanent atrial fibrillation. Continue beta blocker and Cardizem for rate control. Continue Coumadin. Check hemoglobin.

## 2014-03-14 NOTE — Assessment & Plan Note (Signed)
Continue statin. 

## 2014-03-14 NOTE — Patient Instructions (Addendum)
Your physician wants you to follow-up in: [place] will receive a reminder letter in the mail two months in advance. If you don't receive a letter, please call our office to schedule the follow-up appointment.   Your physician recommends that you HAVE LAB WORK TODAY  CONTINUE SAME WARFARIN DOSAGE AND RECHECK IN 4 WEEKS

## 2014-03-14 NOTE — Assessment & Plan Note (Signed)
Blood pressure controlled. Continue present medications. Check potassium and renal function. 

## 2014-03-20 ENCOUNTER — Telehealth: Payer: Self-pay | Admitting: *Deleted

## 2014-03-20 ENCOUNTER — Encounter: Payer: Self-pay | Admitting: Cardiology

## 2014-03-20 ENCOUNTER — Encounter: Payer: Self-pay | Admitting: Gastroenterology

## 2014-03-20 ENCOUNTER — Ambulatory Visit (INDEPENDENT_AMBULATORY_CARE_PROVIDER_SITE_OTHER): Payer: Commercial Managed Care - HMO | Admitting: Gastroenterology

## 2014-03-20 VITALS — BP 140/84 | HR 60 | Ht 70.0 in | Wt 183.0 lb

## 2014-03-20 DIAGNOSIS — T45515A Adverse effect of anticoagulants, initial encounter: Secondary | ICD-10-CM

## 2014-03-20 DIAGNOSIS — D699 Hemorrhagic condition, unspecified: Secondary | ICD-10-CM

## 2014-03-20 DIAGNOSIS — D6832 Hemorrhagic disorder due to extrinsic circulating anticoagulants: Secondary | ICD-10-CM | POA: Insufficient documentation

## 2014-03-20 DIAGNOSIS — Z1211 Encounter for screening for malignant neoplasm of colon: Secondary | ICD-10-CM

## 2014-03-20 MED ORDER — PEG-KCL-NACL-NASULF-NA ASC-C 100 G PO SOLR: 1.0000 | Freq: Once | ORAL | Status: DC

## 2014-03-20 NOTE — Progress Notes (Addendum)
03/20/2014 [name] [id number] [birth year]   HISTORY OF PRESENT ILLNESS:  This is a 64 year old male who is new to our practice and is here today to discuss colonoscopy.  He has never undergone one in the past.  He is actually already scheduled, but needed a visit today because he is on coumadin.  He takes coumadin for atrial fibrillation since 2009.  Cardiac issues are stable, but he is in permanent atrial fibrillation according to note from Dr. Stanford Breed last week.   Patient admits to some intermittent constipation.  He says that he has seen some bright red blood with BM's on occasion, but nothing recently.  Denies FH of colon cancer.     Past Medical History  Diagnosis Date  . Atrial fibrillation   . Hypertension   . Hyperlipidemia   . COPD (chronic obstructive pulmonary disease)   . Amaurosis fugax of left eye     history of   . Hx of echocardiogram     Echo (07/09/13):  Mild LVH, EF 55-60%, MAC, mild MR, severe LAE, mod RAE   History reviewed. No pertinent past surgical history.  reports that he has been smoking Cigarettes.  He has been smoking about 2.00 packs per day. He has never used smokeless tobacco. He reports that he does not drink alcohol or use illicit drugs. family history includes Cancer in his father, mother, and sister; Hypertension in his mother. There is no history of Early death, Heart disease, Hyperlipidemia, Kidney disease, or Stroke. Allergies  Allergen Reactions  . Aspirin Nausea And Vomiting      Outpatient Encounter Prescriptions as of 03/20/2014  Medication Sig  . clonazePAM (KLONOPIN) 1 MG tablet Take 1 tablet (1 mg total) by mouth 3 (three) times daily.  Marland Kitchen diltiazem (DILACOR XR) 240 MG 24 hr capsule Take 1 capsule (240 mg total) by mouth daily.  Marland Kitchen gabapentin (NEURONTIN) 100 MG capsule Take 1 capsule (100 mg total) by mouth at bedtime.  . metoprolol (LOPRESSOR) 100 MG tablet Take 1 tablet (100 mg total) by mouth 2 (two) times daily.  .  pravastatin (PRAVACHOL) 40 MG tablet Take 1 tablet (40 mg total) by mouth daily.  . sertraline (ZOLOFT) 50 MG tablet Take 1 tablet (50 mg total) by mouth at bedtime.  . traMADol (ULTRAM) 50 MG tablet Take 50 mg by mouth every 6 (six) hours as needed.  . warfarin (COUMADIN) 5 MG tablet Take as directed by coumadin clinic     REVIEW OF SYSTEMS  : All other systems reviewed and negative except where noted in the History of Present Illness.   PHYSICAL EXAM: BP 140/84 mmHg  Pulse 60  Ht 5\' 10"  (1.778 m)  Wt 183 lb (83.008 kg)  BMI 26.26 kg/m2 General: Well developed white male in no acute distress Head: Normocephalic and atraumatic Eyes:  Sclerae anicteric, conjunctiva pink. Ears: Normal auditory acuity Lungs: Clear throughout to auscultation Heart:  Irregularly irregular. Abdomen: Soft, non-distended.  Normal bowel sounds.  Non-tender. Rectal:  Will be done at the time of colonoscopy.   Musculoskeletal: Symmetrical with no gross deformities  Skin: No lesions on visible extremities Extremities: No edema  Neurological: Alert oriented x 4, grossly non-focal Psychological:  Alert and cooperative. Normal mood and affect  ASSESSMENT AND PLAN: -Screening colonoscopy:   Never had one in the past.  Will schedule with Dr. Hilarie Fredrickson.  The risks, benefits, and alternatives were discussed with the patient and he consents to proceed.  -Coumadin  coagulopathy:  On this for atrial fibrillation.  Is in permanent atrial fibrillation according to recent cardiology note.  The risks benefits and alternatives to a temporary hold of anti-coagulants/anti-platelets for the procedure were discussed with the patient he consents to proceed. Obtain clearance from Dr. Stanford Breed for ok to hold coumadin for 5 days prior to procedure.   Addendum: Reviewed and agree with initial management. Jerene Bears, MD

## 2014-03-20 NOTE — Telephone Encounter (Signed)
  03/20/2014   RE: [name] DOB: March 25, 1950 MRN: [id number]   Dear  Queen Blossom    We have scheduled the above patient for an endoscopic procedure. Our records show that he is on anticoagulation therapy.   Please advise as to how long the patient may come off his therapy of coumadin prior to the procedure, which is scheduled for 04/01/2014.  Please fax back/ or route the completed form to [place] at [phone].   Sincerely,    Genella Mech

## 2014-03-20 NOTE — Telephone Encounter (Signed)
Would need lovenox bridge while off coumadin as h/o TIA and perm a fib. Kirk Ruths

## 2014-03-20 NOTE — Patient Instructions (Signed)
You have been scheduled for a colonoscopy. Please follow written instructions given to you at your visit today.  Please pick up your prep kit at the pharmacy within the next 1-3 days. If you use inhalers (even only as needed), please bring them with you on the day of your procedure. Your physician has requested that you go to www.startemmi.com and enter the access code given to you at your visit today. This web site gives a general overview about your procedure. However, you should still follow specific instructions given to you by our office regarding your preparation for the procedure.  You will be contaced by our office prior to your procedure for directions on holding your Coumadin/Warfarin.  If you do not hear from our office 1 week prior to your scheduled procedure, please call [phone] to discuss.

## 2014-03-26 NOTE — Telephone Encounter (Signed)
-----   Message from Oda Kilts, [place] sent at 03/26/2014  8:06 AM EST ----- Regarding: FW: PROCEDURE    ----- Message -----    From: Jerene Bears, MD    Sent: 03/25/2014   2:59 PM      To: Larina Bras, CMA, Oda Kilts, CMA Subject: RE: PROCEDURE                                  Barbera Setters is aware of [place] blocks for outpt procedures for me This is on 2 days that are NOT apart of hospital weeks He can be placed into one of these spots. His warfarin instructions will need to be given accordingly Thanks  Ulice Dash  ----- Message -----    From: Oda Kilts, CMA    Sent: 03/20/2014   9:16 AM      To: Jerene Bears, MD Subject: PROCEDURE                                      This patient came in today and got his paperwork. He said he absolutely does not have anyone to come with him and cant afford the service to have someone with him. I was wondering if he could be rescheduled at the hospital. He is pitiful and said he has no family and no friends. I left him scheduled for now

## 2014-03-26 NOTE — Telephone Encounter (Signed)
Patient has decided not to have the procedure at all. He stated he is not financially able to have this procedure at this time. Wants to cancel and will call back in 5 months

## 2014-03-26 NOTE — Telephone Encounter (Signed)
Would place 5 month recall reminder so that patient can be contacted to consider scheduling colonoscopy at that time. Per cardiology would need Lovenox bridging

## 2014-03-26 NOTE — Telephone Encounter (Signed)
Recall in for july

## 2014-04-01 ENCOUNTER — Encounter: Payer: Commercial Managed Care - HMO | Admitting: Internal Medicine

## 2014-04-02 ENCOUNTER — Other Ambulatory Visit: Payer: Self-pay | Admitting: Cardiology

## 2014-04-02 NOTE — Telephone Encounter (Signed)
°  1. Which medications need to be refilled? Warfarin-new prescription  2. Which pharmacy is medication to be sent to?[facility]-[phone]  3. Do they need a 30 day or 90 day supply? 30  4. Would they like a call back once the medication has been sent to the pharmacy? yes

## 2014-04-03 MED ORDER — WARFARIN SODIUM 5 MG PO TABS: ORAL_TABLET | ORAL | Status: DC

## 2014-04-05 ENCOUNTER — Emergency Department (HOSPITAL_COMMUNITY): Payer: Commercial Managed Care - HMO | Admitting: Certified Registered Nurse Anesthetist

## 2014-04-05 ENCOUNTER — Inpatient Hospital Stay (HOSPITAL_COMMUNITY)
Admission: EM | Admit: 2014-04-05 | Discharge: 2014-04-09 | DRG: 254 | Disposition: A | Discharge status: Home / Self Care | Payer: Commercial Managed Care - HMO | Attending: Vascular Surgery | Admitting: Vascular Surgery

## 2014-04-05 ENCOUNTER — Encounter (HOSPITAL_COMMUNITY): Payer: Self-pay | Admitting: Emergency Medicine

## 2014-04-05 ENCOUNTER — Encounter (HOSPITAL_COMMUNITY)
Admission: EM | Disposition: A | Discharge status: Home / Self Care | Payer: Commercial Managed Care - HMO | Source: Home / Self Care | Attending: Emergency Medicine

## 2014-04-05 DIAGNOSIS — I742 Embolism and thrombosis of arteries of the upper extremities: Secondary | ICD-10-CM | POA: Diagnosis present

## 2014-04-05 DIAGNOSIS — I999 Unspecified disorder of circulatory system: Secondary | ICD-10-CM

## 2014-04-05 DIAGNOSIS — I10 Essential (primary) hypertension: Secondary | ICD-10-CM | POA: Diagnosis present

## 2014-04-05 DIAGNOSIS — I7090 Unspecified atherosclerosis: Secondary | ICD-10-CM

## 2014-04-05 DIAGNOSIS — J449 Chronic obstructive pulmonary disease, unspecified: Secondary | ICD-10-CM | POA: Diagnosis present

## 2014-04-05 DIAGNOSIS — I4891 Unspecified atrial fibrillation: Secondary | ICD-10-CM | POA: Diagnosis present

## 2014-04-05 DIAGNOSIS — Z886 Allergy status to analgesic agent status: Secondary | ICD-10-CM | POA: Diagnosis not present

## 2014-04-05 DIAGNOSIS — F17210 Nicotine dependence, cigarettes, uncomplicated: Secondary | ICD-10-CM | POA: Diagnosis present

## 2014-04-05 DIAGNOSIS — I998 Other disorder of circulatory system: Secondary | ICD-10-CM

## 2014-04-05 DIAGNOSIS — Z79899 Other long term (current) drug therapy: Secondary | ICD-10-CM

## 2014-04-05 DIAGNOSIS — E785 Hyperlipidemia, unspecified: Secondary | ICD-10-CM | POA: Diagnosis present

## 2014-04-05 DIAGNOSIS — D699 Hemorrhagic condition, unspecified: Secondary | ICD-10-CM

## 2014-04-05 DIAGNOSIS — F17200 Nicotine dependence, unspecified, uncomplicated: Secondary | ICD-10-CM

## 2014-04-05 DIAGNOSIS — D6832 Hemorrhagic disorder due to extrinsic circulating anticoagulants: Secondary | ICD-10-CM

## 2014-04-05 DIAGNOSIS — T45515A Adverse effect of anticoagulants, initial encounter: Secondary | ICD-10-CM

## 2014-04-05 DIAGNOSIS — I749 Embolism and thrombosis of unspecified artery: Secondary | ICD-10-CM | POA: Diagnosis present

## 2014-04-05 HISTORY — PX: PATCH ANGIOPLASTY: SHX6230

## 2014-04-05 HISTORY — PX: ARCH AORTOGRAM: SHX5501

## 2014-04-05 HISTORY — PX: EMBOLECTOMY: SHX44

## 2014-04-05 LAB — CBC WITH DIFFERENTIAL/PLATELET
BASOS ABS: 0 10*3/uL (ref 0.0–0.1)
BASOS PCT: 0 % (ref 0–1)
Eosinophils Absolute: 0.1 10*3/uL (ref 0.0–0.7)
Eosinophils Relative: 1 % (ref 0–5)
HEMATOCRIT: 45.6 % (ref 39.0–52.0)
HEMOGLOBIN: 16.1 g/dL (ref 13.0–17.0)
Lymphocytes Relative: 24 % (ref 12–46)
Lymphs Abs: 3.3 10*3/uL (ref 0.7–4.0)
MCH: 32.1 pg (ref 26.0–34.0)
MCHC: 35.3 g/dL (ref 30.0–36.0)
MCV: 90.8 fL (ref 78.0–100.0)
MONO ABS: 1.1 10*3/uL — AB (ref 0.1–1.0)
Monocytes Relative: 8 % (ref 3–12)
NEUTROS ABS: 8.9 10*3/uL — AB (ref 1.7–7.7)
NEUTROS PCT: 67 % (ref 43–77)
Platelets: 241 10*3/uL (ref 150–400)
RBC: 5.02 MIL/uL (ref 4.22–5.81)
RDW: 13.4 % (ref 11.5–15.5)
WBC: 13.4 10*3/uL — ABNORMAL HIGH (ref 4.0–10.5)

## 2014-04-05 LAB — COMPREHENSIVE METABOLIC PANEL
ALT: 17 U/L (ref 0–53)
ANION GAP: 7 (ref 5–15)
AST: 23 U/L (ref 0–37)
Albumin: 4.3 g/dL (ref 3.5–5.2)
Alkaline Phosphatase: 72 U/L (ref 39–117)
BUN: 9 mg/dL (ref 6–23)
CO2: 28 mmol/L (ref 19–32)
Calcium: 9.3 mg/dL (ref 8.4–10.5)
Chloride: 101 mmol/L (ref 96–112)
Creatinine, Ser: 0.92 mg/dL (ref 0.50–1.35)
GFR, EST NON AFRICAN AMERICAN: 87 mL/min — AB (ref >90)
Glucose, Bld: 116 mg/dL — ABNORMAL HIGH (ref 70–99)
POTASSIUM: 3.8 mmol/L (ref 3.5–5.1)
SODIUM: 136 mmol/L (ref 135–145)
TOTAL PROTEIN: 7.1 g/dL (ref 6.0–8.3)
Total Bilirubin: 1.4 mg/dL — ABNORMAL HIGH (ref 0.3–1.2)

## 2014-04-05 LAB — GLUCOSE, CAPILLARY: Glucose-Capillary: 93 mg/dL (ref 70–99)

## 2014-04-05 LAB — PROTIME-INR
INR: 1.3 (ref 0.00–1.49)
PROTHROMBIN TIME: 16.3 s — AB (ref 11.6–15.2)

## 2014-04-05 LAB — ABO/RH: ABO/RH(D): A POS

## 2014-04-05 LAB — APTT: aPTT: 40 seconds — ABNORMAL HIGH (ref 24–37)

## 2014-04-05 SURGERY — EMBOLECTOMY
Anesthesia: General | Site: Arm Upper

## 2014-04-05 MED ORDER — THROMBIN 20000 UNITS EX SOLR
CUTANEOUS | Status: AC
  Filled 2014-04-05: qty 20000

## 2014-04-05 MED ORDER — FENTANYL CITRATE 0.05 MG/ML IJ SOLN
INTRAMUSCULAR | Status: DC | PRN
  Administered 2014-04-05: 25 ug via INTRAVENOUS
  Administered 2014-04-05: 50 ug via INTRAVENOUS
  Administered 2014-04-05 (×7): 25 ug via INTRAVENOUS
  Administered 2014-04-05: 75 ug via INTRAVENOUS
  Administered 2014-04-05: 25 ug via INTRAVENOUS

## 2014-04-05 MED ORDER — PROPOFOL 10 MG/ML IV BOLUS
INTRAVENOUS | Status: AC
  Filled 2014-04-05: qty 20

## 2014-04-05 MED ORDER — SODIUM CHLORIDE 0.9 % IV SOLN: 500.0000 mL | Freq: Once | INTRAVENOUS | Status: AC | PRN

## 2014-04-05 MED ORDER — ACETAMINOPHEN 325 MG PO TABS
325.0000 mg | ORAL_TABLET | ORAL | Status: DC | PRN
Start: 2014-04-05 — End: 2014-04-05

## 2014-04-05 MED ORDER — MORPHINE SULFATE 2 MG/ML IJ SOLN
2.0000 mg | INTRAMUSCULAR | Status: DC | PRN
  Administered 2014-04-06: 2 mg via INTRAVENOUS
  Administered 2014-04-06: 4 mg via INTRAVENOUS
  Administered 2014-04-06 – 2014-04-07 (×4): 2 mg via INTRAVENOUS
  Administered 2014-04-08: 4 mg via INTRAVENOUS
  Filled 2014-04-05: qty 2
  Filled 2014-04-05 (×4): qty 1
  Filled 2014-04-05: qty 2
  Filled 2014-04-05: qty 1

## 2014-04-05 MED ORDER — THROMBIN 20000 UNITS EX KIT
PACK | CUTANEOUS | Status: DC | PRN
  Administered 2014-04-05: 20 mL via TOPICAL

## 2014-04-05 MED ORDER — METOPROLOL TARTRATE 1 MG/ML IV SOLN: 2.0000 mg | INTRAVENOUS | Status: DC | PRN

## 2014-04-05 MED ORDER — DILTIAZEM HCL ER 240 MG PO CP24
240.0000 mg | ORAL_CAPSULE | Freq: Every day | ORAL | Status: DC
  Administered 2014-04-06 – 2014-04-09 (×4): 240 mg via ORAL
  Filled 2014-04-05 (×4): qty 1

## 2014-04-05 MED ORDER — FENTANYL CITRATE 0.05 MG/ML IJ SOLN
INTRAMUSCULAR | Status: AC
  Filled 2014-04-05: qty 5

## 2014-04-05 MED ORDER — ONDANSETRON HCL 4 MG/2ML IJ SOLN
INTRAMUSCULAR | Status: DC | PRN
Start: 2014-04-05 — End: 2014-04-05
  Administered 2014-04-05: 4 mg via INTRAVENOUS

## 2014-04-05 MED ORDER — METOPROLOL TARTRATE 100 MG PO TABS
100.0000 mg | ORAL_TABLET | Freq: Two times a day (BID) | ORAL | Status: DC
  Administered 2014-04-06 – 2014-04-09 (×8): 100 mg via ORAL
  Filled 2014-04-05 (×11): qty 1

## 2014-04-05 MED ORDER — HEPARIN (PORCINE) IN NACL 100-0.45 UNIT/ML-% IJ SOLN
1000.0000 [IU]/h | INTRAMUSCULAR | Status: DC
  Administered 2014-04-05: 1000 [IU]/h via INTRAVENOUS
  Filled 2014-04-05 (×2): qty 250

## 2014-04-05 MED ORDER — LABETALOL HCL 5 MG/ML IV SOLN
10.0000 mg | INTRAVENOUS | Status: DC | PRN
  Filled 2014-04-05 (×2): qty 4

## 2014-04-05 MED ORDER — DEXTROSE 5 % IV SOLN: 1.5000 g | Freq: Two times a day (BID) | INTRAVENOUS | Status: DC

## 2014-04-05 MED ORDER — ONDANSETRON HCL 4 MG/2ML IJ SOLN: 4.0000 mg | Freq: Once | INTRAMUSCULAR | Status: DC | PRN

## 2014-04-05 MED ORDER — ACETAMINOPHEN 325 MG PO TABS: 325.0000 mg | ORAL_TABLET | ORAL | Status: DC | PRN

## 2014-04-05 MED ORDER — LIDOCAINE HCL 1 % IJ SOLN
INTRAMUSCULAR | Status: DC | PRN
  Administered 2014-04-05: 15 mL

## 2014-04-05 MED ORDER — DOCUSATE SODIUM 100 MG PO CAPS: 100.0000 mg | ORAL_CAPSULE | Freq: Every day | ORAL | Status: DC

## 2014-04-05 MED ORDER — GABAPENTIN 100 MG PO CAPS
100.0000 mg | ORAL_CAPSULE | Freq: Every day | ORAL | Status: DC
  Administered 2014-04-06 – 2014-04-08 (×4): 100 mg via ORAL
  Filled 2014-04-05 (×7): qty 1

## 2014-04-05 MED ORDER — HYDRALAZINE HCL 20 MG/ML IJ SOLN: 5.0000 mg | INTRAMUSCULAR | Status: DC | PRN

## 2014-04-05 MED ORDER — SODIUM CHLORIDE 0.9 % IR SOLN
Status: DC | PRN
  Administered 2014-04-05 (×2): 500 mL

## 2014-04-05 MED ORDER — PRAVASTATIN SODIUM 40 MG PO TABS
40.0000 mg | ORAL_TABLET | Freq: Every day | ORAL | Status: DC
  Administered 2014-04-06 – 2014-04-09 (×4): 40 mg via ORAL
  Filled 2014-04-05 (×6): qty 1

## 2014-04-05 MED ORDER — GUAIFENESIN-DM 100-10 MG/5ML PO SYRP: 15.0000 mL | ORAL_SOLUTION | ORAL | Status: DC | PRN

## 2014-04-05 MED ORDER — HEPARIN SODIUM (PORCINE) 1000 UNIT/ML IJ SOLN
INTRAMUSCULAR | Status: DC | PRN
  Administered 2014-04-05 (×3): 5000 [IU] via INTRAVENOUS

## 2014-04-05 MED ORDER — DOCUSATE SODIUM 100 MG PO CAPS
100.0000 mg | ORAL_CAPSULE | Freq: Every day | ORAL | Status: DC
  Administered 2014-04-06 – 2014-04-09 (×4): 100 mg via ORAL
  Filled 2014-04-05 (×5): qty 1

## 2014-04-05 MED ORDER — PANTOPRAZOLE SODIUM 40 MG PO TBEC
40.0000 mg | DELAYED_RELEASE_TABLET | Freq: Every day | ORAL | Status: DC
  Administered 2014-04-06 – 2014-04-09 (×4): 40 mg via ORAL
  Filled 2014-04-05 (×4): qty 1

## 2014-04-05 MED ORDER — MAGNESIUM SULFATE 2 GM/50ML IV SOLN: 2.0000 g | Freq: Every day | INTRAVENOUS | Status: DC | PRN

## 2014-04-05 MED ORDER — ALUM & MAG HYDROXIDE-SIMETH 200-200-20 MG/5ML PO SUSP: 15.0000 mL | ORAL | Status: DC | PRN

## 2014-04-05 MED ORDER — CLONAZEPAM 1 MG PO TABS
1.0000 mg | ORAL_TABLET | Freq: Three times a day (TID) | ORAL | Status: DC
  Administered 2014-04-06 – 2014-04-09 (×7): 1 mg via ORAL
  Filled 2014-04-05 (×9): qty 1

## 2014-04-05 MED ORDER — MORPHINE SULFATE 4 MG/ML IJ SOLN
4.0000 mg | Freq: Once | INTRAMUSCULAR | Status: DC
  Filled 2014-04-05: qty 1

## 2014-04-05 MED ORDER — ONDANSETRON HCL 4 MG/2ML IJ SOLN: 4.0000 mg | Freq: Four times a day (QID) | INTRAMUSCULAR | Status: DC | PRN

## 2014-04-05 MED ORDER — PHENOL 1.4 % MT LIQD: 1.0000 | OROMUCOSAL | Status: DC | PRN

## 2014-04-05 MED ORDER — MEPERIDINE HCL 25 MG/ML IJ SOLN: 6.2500 mg | INTRAMUSCULAR | Status: DC | PRN

## 2014-04-05 MED ORDER — MIDAZOLAM HCL 5 MG/5ML IJ SOLN
INTRAMUSCULAR | Status: DC | PRN
  Administered 2014-04-05: 2 mg via INTRAVENOUS

## 2014-04-05 MED ORDER — ONDANSETRON HCL 4 MG/2ML IJ SOLN
INTRAMUSCULAR | Status: AC
  Filled 2014-04-05: qty 2

## 2014-04-05 MED ORDER — POTASSIUM CHLORIDE CRYS ER 20 MEQ PO TBCR: 20.0000 meq | EXTENDED_RELEASE_TABLET | Freq: Every day | ORAL | Status: DC | PRN

## 2014-04-05 MED ORDER — HYDROMORPHONE HCL 1 MG/ML IJ SOLN
INTRAMUSCULAR | Status: AC
  Administered 2014-04-05: 0.5 mg via INTRAVENOUS
  Filled 2014-04-05: qty 1

## 2014-04-05 MED ORDER — LABETALOL HCL 5 MG/ML IV SOLN: 10.0000 mg | INTRAVENOUS | Status: DC | PRN

## 2014-04-05 MED ORDER — LIDOCAINE HCL (CARDIAC) 20 MG/ML IV SOLN
INTRAVENOUS | Status: AC
  Filled 2014-04-05: qty 5

## 2014-04-05 MED ORDER — LACTATED RINGERS IV SOLN
INTRAVENOUS | Status: DC | PRN
  Administered 2014-04-05 (×2): via INTRAVENOUS

## 2014-04-05 MED ORDER — CEFAZOLIN SODIUM-DEXTROSE 2-3 GM-% IV SOLR
INTRAVENOUS | Status: DC | PRN
  Administered 2014-04-05 (×2): 2 g via INTRAVENOUS

## 2014-04-05 MED ORDER — SERTRALINE HCL 50 MG PO TABS
50.0000 mg | ORAL_TABLET | Freq: Every day | ORAL | Status: DC
  Administered 2014-04-06 – 2014-04-08 (×4): 50 mg via ORAL
  Filled 2014-04-05 (×7): qty 1

## 2014-04-05 MED ORDER — MAGNESIUM SULFATE 2 GM/50ML IV SOLN
2.0000 g | Freq: Every day | INTRAVENOUS | Status: DC | PRN
  Filled 2014-04-05: qty 50

## 2014-04-05 MED ORDER — PANTOPRAZOLE SODIUM 40 MG PO TBEC: 40.0000 mg | DELAYED_RELEASE_TABLET | Freq: Every day | ORAL | Status: DC

## 2014-04-05 MED ORDER — ROCURONIUM BROMIDE 50 MG/5ML IV SOLN
INTRAVENOUS | Status: AC
  Filled 2014-04-05: qty 1

## 2014-04-05 MED ORDER — ACETAMINOPHEN 650 MG RE SUPP: 325.0000 mg | RECTAL | Status: DC | PRN

## 2014-04-05 MED ORDER — DEXTROSE 5 % IV SOLN
1.5000 g | Freq: Two times a day (BID) | INTRAVENOUS | Status: AC
  Administered 2014-04-06 (×2): 1.5 g via INTRAVENOUS
  Filled 2014-04-05 (×3): qty 1.5

## 2014-04-05 MED ORDER — MORPHINE SULFATE 4 MG/ML IJ SOLN
4.0000 mg | Freq: Once | INTRAMUSCULAR | Status: AC
  Administered 2014-04-05: 4 mg via INTRAVENOUS
  Filled 2014-04-05: qty 1

## 2014-04-05 MED ORDER — HEPARIN (PORCINE) IN NACL 100-0.45 UNIT/ML-% IJ SOLN
INTRAMUSCULAR | Status: DC | PRN
  Administered 2014-04-05: 1000 [IU]/h via INTRAVENOUS

## 2014-04-05 MED ORDER — POTASSIUM CHLORIDE CRYS ER 20 MEQ PO TBCR
20.0000 meq | EXTENDED_RELEASE_TABLET | Freq: Every day | ORAL | Status: DC | PRN
Start: 2014-04-05 — End: 2014-04-09

## 2014-04-05 MED ORDER — PROPOFOL INFUSION 10 MG/ML OPTIME
INTRAVENOUS | Status: DC | PRN
  Administered 2014-04-05: 50 ug/kg/min via INTRAVENOUS

## 2014-04-05 MED ORDER — ACETAMINOPHEN 650 MG RE SUPP
325.0000 mg | RECTAL | Status: DC | PRN
Start: 2014-04-05 — End: 2014-04-05

## 2014-04-05 MED ORDER — 0.9 % SODIUM CHLORIDE (POUR BTL) OPTIME
TOPICAL | Status: DC | PRN
  Administered 2014-04-05: 1000 mL

## 2014-04-05 MED ORDER — MIDAZOLAM HCL 2 MG/2ML IJ SOLN
INTRAMUSCULAR | Status: AC
  Filled 2014-04-05: qty 2

## 2014-04-05 MED ORDER — LACTATED RINGERS IV SOLN
INTRAVENOUS | Status: DC
  Administered 2014-04-05: 14:00:00 via INTRAVENOUS

## 2014-04-05 MED ORDER — HYDROMORPHONE HCL 1 MG/ML IJ SOLN
0.2500 mg | INTRAMUSCULAR | Status: DC | PRN
  Administered 2014-04-05 (×2): 0.5 mg via INTRAVENOUS

## 2014-04-05 MED ORDER — IOHEXOL 300 MG/ML  SOLN
INTRAMUSCULAR | Status: DC | PRN
  Administered 2014-04-05: 83.9 mL via INTRAVENOUS

## 2014-04-05 MED ORDER — PROPOFOL 10 MG/ML IV BOLUS
INTRAVENOUS | Status: DC | PRN
  Administered 2014-04-05: 20 mg via INTRAVENOUS

## 2014-04-05 MED ORDER — SODIUM CHLORIDE 0.45 % IV SOLN
INTRAVENOUS | Status: DC
  Administered 2014-04-05: via INTRAVENOUS

## 2014-04-05 MED ORDER — LIDOCAINE HCL (PF) 1 % IJ SOLN
INTRAMUSCULAR | Status: AC
  Filled 2014-04-05: qty 30

## 2014-04-05 SURGICAL SUPPLY — 95 items
BANDAGE ESMARK 6X9 LF (GAUZE/BANDAGES/DRESSINGS) IMPLANT
BIOPATCH RED 1 DISK 7.0 (GAUZE/BANDAGES/DRESSINGS) ×4 IMPLANT
BNDG ESMARK 6X9 LF (GAUZE/BANDAGES/DRESSINGS)
BNDG GAUZE ELAST 4 BULKY (GAUZE/BANDAGES/DRESSINGS) IMPLANT
CANISTER SUCTION 2500CC (MISCELLANEOUS) ×4 IMPLANT
CANNULA VESSEL 3MM 2 BLNT TIP (CANNULA) ×4 IMPLANT
CATH ACCU-VU SIZ PIG 5F 100CM (CATHETERS) ×4 IMPLANT
CATH EMB 3FR 80CM (CATHETERS) ×4 IMPLANT
CATH EMB 4FR 80CM (CATHETERS) ×4 IMPLANT
CATH EMB 5FR 80CM (CATHETERS) IMPLANT
CATH EXPO 5FR FR4 (CATHETERS) ×4 IMPLANT
CATH HEADHUNTER H1 5F 100CM (CATHETERS) ×4 IMPLANT
CLIP TI MEDIUM 24 (CLIP) IMPLANT
CLIP TI WIDE RED SMALL 24 (CLIP) IMPLANT
CONT SPEC 4OZ CLIKSEAL STRL BL (MISCELLANEOUS) ×4 IMPLANT
COVER PROBE W GEL 5X96 (DRAPES) ×8 IMPLANT
COVER SURGICAL LIGHT HANDLE (MISCELLANEOUS) IMPLANT
CUFF TOURNIQUET SINGLE 24IN (TOURNIQUET CUFF) IMPLANT
CUFF TOURNIQUET SINGLE 34IN LL (TOURNIQUET CUFF) IMPLANT
CUFF TOURNIQUET SINGLE 44IN (TOURNIQUET CUFF) IMPLANT
DECANTER SPIKE VIAL GLASS SM (MISCELLANEOUS) IMPLANT
DEVICE TORQUE KENDALL .025-038 (MISCELLANEOUS) ×4 IMPLANT
DRAIN SNY 10X20 3/4 PERF (WOUND CARE) IMPLANT
DRAPE INCISE IOBAN 66X45 STRL (DRAPES) ×4 IMPLANT
DRAPE ORTHO SPLIT 77X108 STRL (DRAPES) ×1
DRAPE SURG ORHT 6 SPLT 77X108 (DRAPES) ×3 IMPLANT
DRAPE X-RAY CASS 24X20 (DRAPES) IMPLANT
DRSG COVADERM 4X8 (GAUZE/BANDAGES/DRESSINGS) IMPLANT
DRSG SORBAVIEW 3.5X5-5/16 MED (GAUZE/BANDAGES/DRESSINGS) ×4 IMPLANT
ELECT CAUTERY BLADE 6.4 (BLADE) ×4 IMPLANT
ELECT REM PT RETURN 9FT ADLT (ELECTROSURGICAL) ×4
ELECTRODE REM PT RTRN 9FT ADLT (ELECTROSURGICAL) ×3 IMPLANT
EVACUATOR SILICONE 100CC (DRAIN) IMPLANT
GAUZE SPONGE 4X4 12PLY STRL (GAUZE/BANDAGES/DRESSINGS) IMPLANT
GAUZE SPONGE 4X4 16PLY XRAY LF (GAUZE/BANDAGES/DRESSINGS) ×4 IMPLANT
GLOVE BIO SURGEON STRL SZ7.5 (GLOVE) ×8 IMPLANT
GLOVE BIOGEL PI IND STRL 6.5 (GLOVE) ×9 IMPLANT
GLOVE BIOGEL PI IND STRL 7.5 (GLOVE) ×3 IMPLANT
GLOVE BIOGEL PI INDICATOR 6.5 (GLOVE) ×3
GLOVE BIOGEL PI INDICATOR 7.5 (GLOVE) ×1
GLOVE ECLIPSE 7.0 STRL STRAW (GLOVE) ×4 IMPLANT
GLOVE SURG SS PI 7.0 STRL IVOR (GLOVE) ×8 IMPLANT
GOWN STRL REUS W/ TWL LRG LVL3 (GOWN DISPOSABLE) ×9 IMPLANT
GOWN STRL REUS W/ TWL XL LVL3 (GOWN DISPOSABLE) ×9 IMPLANT
GOWN STRL REUS W/TWL LRG LVL3 (GOWN DISPOSABLE) ×3
GOWN STRL REUS W/TWL XL LVL3 (GOWN DISPOSABLE) ×3
GUIDEWIRE ANGLED .035X260CM (WIRE) ×4 IMPLANT
KIT BASIN OR (CUSTOM PROCEDURE TRAY) ×4 IMPLANT
KIT ENCORE 26 ADVANTAGE (KITS) ×4 IMPLANT
KIT ROOM TURNOVER OR (KITS) ×4 IMPLANT
LIQUID BAND (GAUZE/BANDAGES/DRESSINGS) ×4 IMPLANT
LOOP VESSEL MINI RED (MISCELLANEOUS) ×4 IMPLANT
NEEDLE PERC 18GX7CM (NEEDLE) ×4 IMPLANT
NS IRRIG 1000ML POUR BTL (IV SOLUTION) ×8 IMPLANT
PACK CV ACCESS (CUSTOM PROCEDURE TRAY) ×4 IMPLANT
PACK GENERAL/GYN (CUSTOM PROCEDURE TRAY) IMPLANT
PACK PERIPHERAL VASCULAR (CUSTOM PROCEDURE TRAY) IMPLANT
PACK UNIVERSAL I (CUSTOM PROCEDURE TRAY) IMPLANT
PAD ARMBOARD 7.5X6 YLW CONV (MISCELLANEOUS) ×8 IMPLANT
PADDING CAST COTTON 6X4 STRL (CAST SUPPLIES) IMPLANT
PENCIL BUTTON HOLSTER BLD 10FT (ELECTRODE) ×4 IMPLANT
PROBE PENCIL 8 MHZ STRL DISP (MISCELLANEOUS) ×4 IMPLANT
SET COLLECT BLD 21X3/4 12 (NEEDLE) IMPLANT
SET MICROPUNCTURE 5F STIFF (MISCELLANEOUS) ×4 IMPLANT
SHEATH PINNACLE 6F 10CM (SHEATH) ×4 IMPLANT
SPONGE LAP 18X18 X RAY DECT (DISPOSABLE) ×4 IMPLANT
SPONGE SURGIFOAM ABS GEL 100 (HEMOSTASIS) ×4 IMPLANT
STAPLER VISISTAT 35W (STAPLE) ×4 IMPLANT
STOPCOCK 4 WAY LG BORE MALE ST (IV SETS) IMPLANT
STOPCOCK MORSE 400PSI 3WAY (MISCELLANEOUS) ×8 IMPLANT
SUT ETHILON 3 0 PS 1 (SUTURE) IMPLANT
SUT PROLENE 5 0 C 1 24 (SUTURE) IMPLANT
SUT PROLENE 6 0 BV (SUTURE) ×4 IMPLANT
SUT PROLENE 6 0 CC (SUTURE) ×8 IMPLANT
SUT PROLENE 7 0 BV 1 (SUTURE) ×8 IMPLANT
SUT SILK 2 0 SH (SUTURE) ×4 IMPLANT
SUT SILK 3 0 (SUTURE) ×2
SUT SILK 3-0 18XBRD TIE 12 (SUTURE) ×6 IMPLANT
SUT VIC AB 2-0 CTX 36 (SUTURE) IMPLANT
SUT VIC AB 3-0 SH 27 (SUTURE) ×1
SUT VIC AB 3-0 SH 27X BRD (SUTURE) ×3 IMPLANT
SUT VICRYL 4-0 PS2 18IN ABS (SUTURE) ×4 IMPLANT
SYR 20CC LL (SYRINGE) ×16 IMPLANT
SYR 3ML LL SCALE MARK (SYRINGE) ×4 IMPLANT
SYR MEDRAD MARK V 150ML (SYRINGE) ×4 IMPLANT
SYRINGE 10CC LL (SYRINGE) ×20 IMPLANT
SYRINGE 1CC SLIP TB (MISCELLANEOUS) IMPLANT
TOWEL OR 17X26 10 PK STRL BLUE (TOWEL DISPOSABLE) ×4 IMPLANT
TRAY FOLEY CATH 16FRSI W/METER (SET/KITS/TRAYS/PACK) IMPLANT
TUBING EXTENTION W/L.L. (IV SETS) IMPLANT
TUBING HIGH PRESSURE 120CM (CONNECTOR) ×4 IMPLANT
UNDERPAD 30X30 INCONTINENT (UNDERPADS AND DIAPERS) ×4 IMPLANT
WATER STERILE IRR 1000ML POUR (IV SOLUTION) ×4 IMPLANT
WIRE BENTSON .035X145CM (WIRE) ×4 IMPLANT
WIRE HI TORQ VERSACORE J 260CM (WIRE) ×4 IMPLANT

## 2014-04-05 NOTE — ED Notes (Signed)
Pt has hx of AFib and normally takes Coumadin. States he ran out of his Coumadin for 4 days but has now been back on it for 2 days. Started having left arm pain Monday night (4 days ago). States left arm feels "cold and numb". Radial pulse faint. Pt is also a smoker.

## 2014-04-05 NOTE — Progress Notes (Signed)
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

## 2014-04-05 NOTE — Progress Notes (Signed)
ANTICOAGULATION CONSULT NOTE - Initial Consult  Pharmacy Consult for Heparin Indication: New upper extremity DVT  Allergies  Allergen Reactions  . Aspirin Nausea And Vomiting    Patient Measurements: Ht. 5'10'' Wt 183 lb  Vital Signs: Temp: 97.4 F (36.3 C) (02/26 1054) Temp Source: Oral (02/26 1054) BP: 140/106 mmHg (02/26 1331) Pulse Rate: 28 (02/26 1200)  Labs:  Recent Labs  04/05/14 1119  HGB 16.1  HCT 45.6  PLT 241  APTT 40*  LABPROT 16.3*  INR 1.30  CREATININE 0.92   CrCl cannot be calculated (Unknown ideal weight.).  Medical History: Past Medical History  Diagnosis Date  . Atrial fibrillation   . Hypertension   . Hyperlipidemia   . COPD (chronic obstructive pulmonary disease)   . Amaurosis fugax of left eye     history of   . Hx of echocardiogram     Echo (07/09/13):  Mild LVH, EF 55-60%, MAC, mild MR, severe LAE, mod RAE   Medications:  Infusions:  . heparin    . lactated ringers 10 mL/hr at 04/05/14 1345   Assessment: Pt. with hx. of Afib on Warfarin but ran out for 4 days and back on for 2 days is now presenting with upper arm pain.  A new upper DVT has been identified and we have been asked to start IV Heparin.  Currently his PT/INR are 16.3 sec and 1.3.  His aPTT is 40sec.  Goal of Therapy:  Heparin level 0.3-0.7 units/ml Monitor platelets by anticoagulation protocol: Yes   Plan:  Heparin at 1000 units/hr Heparin level at 8 hours  CBC/Daily heparin levels. Monitor for bleeding  Rober Minion, PharmD., MS Clinical Pharmacist Pager:  [phone] Thank you for allowing pharmacy to be part of this patients care team.  04/05/2014,1:52 PM

## 2014-04-05 NOTE — ED Notes (Signed)
Pt moved to POD A-10. Reported to [facility], Therapist, sports.

## 2014-04-05 NOTE — ED Provider Notes (Signed)
CSN: [id number]     Arrival date & time 04/05/14  1035 History   First MD Initiated Contact with Patient 04/05/14 1043     Chief Complaint  Patient presents with  . Arm Pain     Patient is a 64 y.o. male presenting with musculoskeletal pain. The history is provided by the patient.  Muscle Pain This is a new problem. The current episode started more than 2 days ago. The problem occurs daily. The problem has been gradually worsening. Pertinent negatives include no chest pain. Associated symptoms comments: Chronic HA . Exacerbated by: movement. The symptoms are relieved by rest. He has tried nothing for the symptoms.  patient reports left UE pain for past 4 days No trauma He reports numbness/weakness to the arm No cp No new SOB He has no other new weakness He was seen by orthopedist and sent for evaluation He reports he "ran out" of coumadin recently for up to 4 days, and restarted about 3 or 4 days ago   PMH - atrial fibrillation Soc hx - smoker   Past Medical History  Diagnosis Date  . Atrial fibrillation   . Hypertension   . Hyperlipidemia   . COPD (chronic obstructive pulmonary disease)   . Amaurosis fugax of left eye     history of   . Hx of echocardiogram     Echo (07/09/13):  Mild LVH, EF 55-60%, MAC, mild MR, severe LAE, mod RAE   History reviewed. No pertinent past surgical history. Family History  Problem Relation Age of Onset  . Hypertension Mother   . Cancer Mother   . Cancer Father     gastric cancer  . Cancer Sister   . Early death Neg Hx   . Heart disease Neg Hx   . Hyperlipidemia Neg Hx   . Kidney disease Neg Hx   . Stroke Neg Hx    History  Substance Use Topics  . Smoking status: Current Every Day Smoker -- 2.00 packs/day    Types: Cigarettes  . Smokeless tobacco: Never Used  . Alcohol Use: No    Review of Systems  Constitutional: Negative for fever.  Cardiovascular: Negative for chest pain.  Gastrointestinal: Negative for vomiting.   Neurological: Positive for weakness and numbness.  All other systems reviewed and are negative.     Allergies  Aspirin  Home Medications   Prior to Admission medications   Medication Sig Start Date End Date Taking? Authorizing Provider  clonazePAM (KLONOPIN) 1 MG tablet Take 1 tablet (1 mg total) by mouth 3 (three) times daily. 01/29/13   Janith Lima, MD  diltiazem (DILACOR XR) 240 MG 24 hr capsule Take 1 capsule (240 mg total) by mouth daily. 02/21/14   Lelon Perla, MD  gabapentin (NEURONTIN) 100 MG capsule Take 1 capsule (100 mg total) by mouth at bedtime. 07/11/13   Janith Lima, MD  metoprolol (LOPRESSOR) 100 MG tablet Take 1 tablet (100 mg total) by mouth 2 (two) times daily. 02/21/14   Lelon Perla, MD  peg 3350 powder (MOVIPREP) 100 G SOLR Take 1 kit (200 g total) by mouth once. 03/20/14   Jerene Bears, MD  pravastatin (PRAVACHOL) 40 MG tablet Take 1 tablet (40 mg total) by mouth daily. 06/28/13   Lelon Perla, MD  sertraline (ZOLOFT) 50 MG tablet Take 1 tablet (50 mg total) by mouth at bedtime. 07/11/13   Janith Lima, MD  traMADol (ULTRAM) 50 MG tablet Take 50 mg by  mouth every 6 (six) hours as needed.    Historical Provider, MD  warfarin (COUMADIN) 5 MG tablet Take as directed by coumadin clinic 04/03/14   Lelon Perla, MD   BP 121/88 mmHg  Temp(Src) 97.4 F (36.3 C) (Oral)  Resp 18  SpO2 100% Physical Exam CONSTITUTIONAL: Well developed/well nourished HEAD: Normocephalic/atraumatic EYES: EOMI ENMT: Mucous membranes moist NECK: supple no meningeal signs SPINE/BACK:entire spine nontender CV: irregular, no loud murmurs LUNGS: Lungs are clear to auscultation bilaterally, no apparent distress ABDOMEN: soft, nontender, no rebound or guarding, bowel sounds noted throughout abdomen NEURO: Pt is awake/alert/appropriate.  No facial droop. He has difficulty with movement of left UE.  He is able to move his fingers on left hand  EXTREMITIES: there is no palpable  left brachial/ulnar/radial pulses palpated.  The left hand is warm to touch.  No evidence of trauma SKIN: warm PSYCH: no abnormalities of mood noted, alert and oriented to situation  ED Course  Procedures   CRITICAL CARE Performed by: Sharyon Cable Total critical care time: 31 Critical care time was exclusive of separately billable procedures and treating other patients. Critical care was necessary to treat or prevent imminent or life-threatening deterioration. Critical care was time spent personally by me on the following activities: development of treatment plan with patient and/or surrogate as well as nursing, discussions with consultants, evaluation of patient's response to treatment, examination of patient, obtaining history from patient or surrogate, ordering and performing treatments and interventions, ordering and review of laboratory studies, ordering and review of radiographic studies, pulse oximetry and re-evaluation of patient's condition. Pt with acute arterial occlusion of left UE Will go directly to the OR  I spoke to this patient's orthopedist prior to his arrival  11:33 AM Pt with left arm pain/numbness/weakness for 4 days He has no pulse in left wrist but his arm is warm to touch and he can move his fingers D/w dr fields via OR nurse He requests STAT arterial duplex Pt is back on coumadin, INR pending, will defer anticoagulation for now 12:12 PM Pt now leaving for arterial duplex 12:51 PM Pt with abnormal arterial duplex D/w dr fields, he requests starting heparin drip Pt will go to [place] Reviewed  CBC WITH DIFFERENTIAL/PLATELET - Abnormal; Notable for the following:    WBC 13.4 (*)    Neutro Abs 8.9 (*)    Monocytes Absolute 1.1 (*)    All other components within normal limits  COMPREHENSIVE METABOLIC PANEL - Abnormal; Notable for the following:    Glucose, Bld 116 (*)    Total Bilirubin 1.4 (*)    GFR calc non Af Amer 87 (*)    All  other components within normal limits  PROTIME-INR - Abnormal; Notable for the following:    Prothrombin Time 16.3 (*)    All other components within normal limits  APTT - Abnormal; Notable for the following:    aPTT 40 (*)    All other components within normal limits  TYPE AND SCREEN  ABO/RH     EKG Interpretation   Date/Time:  Friday April 05 2014 11:27:04 EST Ventricular Rate:  64 PR Interval:  172 QRS Duration: 84 QT Interval:  432 QTC Calculation: 446 R Axis:   21 Text Interpretation:  atrial fibrillation Probable anteroseptal infarct,  old Minimal ST depression, diffuse leads Baseline wander in lead(s) V3  Confirmed by Christy Gentles  MD, Katelinn Justice ([id number]) on 04/05/2014 11:35:00 AM      MDM  Final diagnoses:  Arterial occlusion  Atrial fibrillation, unspecified    Nursing notes including past medical history and social history reviewed and considered in documentation Labs/vital reviewed myself and considered during evaluation     Sharyon Cable, MD 04/05/14 1252

## 2014-04-05 NOTE — ED Notes (Signed)
PA questionably able to doppler a pulse. Pt came from his PCP office for evaluation.

## 2014-04-05 NOTE — ED Notes (Signed)
   Pt to vascular ultrasound via stretcher

## 2014-04-05 NOTE — Op Note (Signed)
Procedure: #1 thromboendarterectomy left brachial radial and ulnar arteries with vein patch angioplasty. 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 The patient was placed in supine position on operating room table. After adequate sedation the patient's entire left upper extremity is prepped and draped in usual sterile fashion. Next local anesthesia was attributed antecubital area of the left arm. A longitudinal incision was made in this location acute onto the saphenous tissues down level bicipital aponeurosis. This was taken out with cautery. The brachial artery was identified. This was thickened on palpation and had no flow within it. It was dissected free circumferentially and a vessel loop placed around it. Dissection was then carried down to the takeoff of the radial and ulnar arteries. These were both dissected free circumferentially and vessel loops placed around them. There was thick calcified plaque right at the brachial bifurcation. At this point the patient was given 8000 units of intravenous heparin. A heparin drip was continued as well. Transverse arteriotomy was made in the brachial artery just above the brachial bifurcation. The artery was chronically occluded and there was no lumen within it. I then proceeded to dissect out the artery higher up in the arm weren't was more soft on  palpation and seemed to have a reasonable quality lumen. The transverse arteriotomy was then extended longitudinally up to the segment of the brachial artery. The brachial artery was still small and diseased at this level but did have a lumen within it. I then proceeded to do a thromboendarterectomy of this entire segment of the brachial artery which is over approximately 4 cm. The endarterectomy extended into the origins of the radial and ulnar arteries. A fairly reasonable endpoint was obtained in both of these did not appear to be any large intimal flaps. Next I passed a #3 Fogarty catheters down the radial and ulnar arteries. No significant thrombus was removed. There was good backbleeding. I passed the 3 and 4 Fogarty catheter them proximally multiple passes how large amount of thrombus was retrieved. There was some inflow at this point but it was not certainly pulsatile and quality. I suspected from passage of the catheter that there was a subclavian artery stenosis or possible occlusion. Next adjacent basilic vein was harvested and several side branches ligated and divided between silk ties. The proximal and distal ends of the vein were ligated with 2-0 silk tie. The vein was then reversed and opened longitudinally. It was then sewn on the artery as a patch angioplasty using a running 6-0 Prolene suture. Just prior to completion anastomosis was forebled backbled and thoroughly flushed the anastomosis was secured Vesseloops released and there was some brachial flow immediately. 2 repair sutures were placed in the patch. This was then packed with thrombin Gelfoam. There was a weak monophasic radial Doppler signal at this point. The skin was loosely stapled and the area covered with a sterile dressing. Attention was then turned to the patient's groin. The right groin was prepped and draped in usual sterile fashion. Local anesthesia was  ruptured on the right common femoral artery. Ultrasound was used to identify  the right common femoral artery. Micropuncture needle was then used to directly cannulate the right common femoral artery and a micropuncture wire threaded in the right iliac system. The Mc puncture sheath was then placed over the guidewire in the right femoral system. The dilator and guidewire removed and an 035 versacore wire threaded up in the abdominal aorta under fluoroscopic guidance. A 6 French sheath was then placed over the guidewire the right common femoral artery. This was thoroughly flushed with heparinized saline. A 5 French pigtail catheter was placed over the guidewire and these were advanced as a unit up into the ascending aorta. In a 60 LAO view and arch aortogram was performed. This showed a patent innominate right common and right subclavian left common and left subclavian artery. The subclavian arteries were tortuous and ectatic appearing bilaterally. Next an H1 catheter was then used to selectively catheterize the left subclavian artery. Due to its tortuosity there were some manipulation required to get the catheter to engaging get past the level of the vertebral artery. I was able to do this with the H1 catheter and removing the eye to a AP position and again using the H1 catheter directed the catheter over the wire out past the origin of the IMA and vertebral artery. A left subclavian angiogram was then performed. The left subclavian artery was basically patent throughout its course and ectatic. There was a large branch which was smaller and potentially a collateral that extended from the level of the axillary artery and then eventually filled the left brachial artery. The artery was in continuity. I could not tell if this was an aneurysmal segment although it certainly was ectatic. There were certainly no focal stenosis in the subclavian artery that I felt could be treated with a stent or operative procedure. At this point I felt that we had improved the patient's circulation to his hand at  least somewhat with a thromboendarterectomy. I felt that the patient needed further imaging for a definitive procedure to improve his inflow overall. The 5 French H1 catheter was removed over guidewire and the guidewire was removed as well. The 6 French sheath was then thoroughly flushed with heparinized saline and left in place to be pulled in the stepdown unit later after the ACT is less than 175. At this point I turned attention back again to the arm. The incision was reopened. Doppler was placed on the brachial artery and there was minimal flow within this. To make sure that this had not clotted the patient was given an additional 5000 units of intravenous heparin. A transverse graftotomy was made in the patch. #3 Fogarty catheters were passed proximally and distally down the radial ulnar and up past the brachial artery and no further thrombus was retrieved. 2 clean passes were obtained. There was still minimal Doppler flow to the left wrist. So as one last Mayra make sure there was not retained thrombus a infiltrated local anesthesia over the left radial artery and a longitudinal incision in this location and made a transverse arteriotomy in the left radial artery after proximal distal control was obtained with Vesseloops. I was able to pass the 3 Fogarty the full length of the radial artery all the way back up to the arm and again no thrombus was obtained and there was almost pulsatile flow through the radial artery. I also passed 3 Fogarty catheter out into the hand and no further thrombus  was obtained. The arteriotomy was reapproximated using a running 7-0 Prolene suture. At completion anastomosis was forebled backbled and thoroughly flushed the anastomosis was secured vessel loops released hemostasis was then obtained in the radial artery and brachial artery. Subcutaneous tissues of both incisions was closed with a running 3-0 Vicryl suture. The skin with incision was closed with 4-0 Vicryl subcuticular  stitch. The patient tolerated procedure well and there were no complications. Answered sponge and needle count was correct in the case. The patient was taken to the recovery room in stable condition. And is still guarded and condition on hopefully will improve with warming the patient will have a CT Angio the neck and chest tomorrow to determine more definitive treatment to improve inflow to the left arm.  Ruta Hinds, MD [facility] Office: [phone] Pager: [phone]

## 2014-04-05 NOTE — Progress Notes (Signed)
eLink Physician-Brief Progress Note Patient Name: [name] DOB: December 01, 1950 MRN: [id number]   Date of Service  04/05/2014  HPI/Events of Note  61 M with h/o of AF on coumadin, HTN, COPD who presented to ED with ischemic left hand following abrupt d/c of coumadin.  Now s/p surgical thrombectomy.  Patient is HD stable, sleeping on camera check in no respiratory distress.  eICU Interventions  Plan: Plan of care per primary team Continue to monitor via [facility] as needed     Intervention Category Evaluation Type: New Patient Evaluation  DETERDING,ELIZABETH 04/05/2014, 10:27 PM

## 2014-04-05 NOTE — Progress Notes (Signed)
Pt left hand clinically better in PACU.  He subjectively says it is better.  Has brachial but no radial flow CTA tomorrow.  Follow hand clinically as long as symptoms are better  Ruta Hinds, MD [facility] [place]: [phone] Pager: [phone]

## 2014-04-05 NOTE — Anesthesia Preprocedure Evaluation (Addendum)
Anesthesia Evaluation  Patient identified by MRN, date of birth, ID band Patient awake    Reviewed: Allergy & Precautions, NPO status , Patient's Chart, lab work & pertinent test results  Airway Mallampati: II  TM Distance: >3 FB Neck ROM: Full    Dental  (+) Missing, Loose, Chipped Poor dentition:   Pulmonary COPD COPD inhaler, Current Smoker,          Cardiovascular hypertension, Pt. on medications + dysrhythmias Atrial Fibrillation Rhythm:Irregular     Neuro/Psych Anxiety Depression    GI/Hepatic   Endo/Other    Renal/GU      Musculoskeletal   Abdominal   Peds  Hematology   Anesthesia Other Findings   Reproductive/Obstetrics                            Anesthesia Physical Anesthesia Plan  ASA: III  Anesthesia Plan: MAC   Post-op Pain Management:    Induction: Intravenous  Airway Management Planned: Simple Face Mask  Additional Equipment:   Intra-op Plan:   Post-operative Plan:   Informed Consent: I have reviewed the patients History and Physical, chart, labs and discussed the procedure including the risks, benefits and alternatives for the proposed anesthesia with the patient or authorized representative who has indicated his/her understanding and acceptance.   Dental advisory given  Plan Discussed with: Surgeon and CRNA  Anesthesia Plan Comments:        Anesthesia Quick Evaluation

## 2014-04-05 NOTE — OR Nursing (Signed)
Patient stated "12oz of lemonade at 0700 04/05/2014".

## 2014-04-05 NOTE — Transfer of Care (Signed)
Immediate Anesthesia Transfer of Care Note  Patient: [name]  Procedure(s) Performed: Procedure(s): THROMBO ENDARTERECTOMY OF LEFT BRACHIAL, RADIAL AND ULNAR ARTERY,  Left Radial artery cut down and radial artery thrombectomy. (Left) LEFT ARM VEIN PATCH ANGIOPLASTY (Left) ARCH AORTOGRAM, FIRST ORDER CATHETERIZATION LEFT SUBCLAVIAN ARTERY (N/A)  Patient Location: PACU  Anesthesia Type:MAC  Level of Consciousness: awake and patient cooperative  Airway & Oxygen Therapy: Patient Spontanous Breathing  Post-op Assessment: Report given to RN and Post -op Vital signs reviewed and stable  Post vital signs: Reviewed and stable  Last Vitals:  Filed Vitals:   04/05/14 1331  BP: 140/106  Pulse:   Temp:   Resp:     Complications: No apparent anesthesia complications

## 2014-04-05 NOTE — Anesthesia Postprocedure Evaluation (Signed)
  Anesthesia Post-op Note  Patient: [name]  Procedure(s) Performed: Procedure(s) (LRB): THROMBO ENDARTERECTOMY OF LEFT BRACHIAL, RADIAL AND ULNAR ARTERY,  Left Radial artery cut down and radial artery thrombectomy. (Left) LEFT ARM VEIN PATCH ANGIOPLASTY (Left) ARCH AORTOGRAM, FIRST ORDER CATHETERIZATION LEFT SUBCLAVIAN ARTERY (N/A)  Patient Location: PACU  Anesthesia Type: MAC  Level of Consciousness: awake and alert   Airway and Oxygen Therapy: Patient Spontanous Breathing  Post-op Pain: mild  Post-op Assessment: Post-op Vital signs reviewed, Patient's Cardiovascular Status Stable, Respiratory Function Stable, Patent Airway and No signs of Nausea or vomiting  Last Vitals:  Filed Vitals:   04/05/14 1331  BP: 140/106  Pulse:   Temp:   Resp:     Post-op Vital Signs: stable   Complications: No apparent anesthesia complications

## 2014-04-05 NOTE — ED Provider Notes (Signed)
Patient is a 64 y.o. Male with PMH of A.fib, tobacco dependence with 2 pack per day current smoking, and HTN who presents to the ED at the urging of his doctor for evaluation of arm pain.  Patient states that he has had left arm pain since Monday that has been getting significantly worse.  He has noticed that his hand has been cold and numb and when he was evaluated at his doctor this morning they were unable to find a radial pulse well.  Patient was recently off of his coumadin for 4-5 days as he ran out.  He is unsure whether he was therapeutic before running out.  Patient states that his pain is currently a 10/10.    On PE there is not a palpable radial pulse on the left.  The hand is slightly cool to the touch and there is 5+ seconds with capillary refill.  The hand blanches for 5+ seconds with movement.  There is pain with active and passive ROM.    Concern for possible arterial occlusion will move to the acute care side.  I have found pulse that is difficult to find with doppler.  CBC, CMP, and PT/INR ordered.  Dr. Christy Gentles notified.    Cherylann Parr, PA-C 04/05/14 [place], MD 04/05/14 1330

## 2014-04-05 NOTE — Consult Note (Signed)
[facility] [place] CONSULT NOTE   MRN : [id number]  Reason for Consult: Left arm ischemic pain Referring Physician: ED  History of Present Illness: 64 y/o male who reports left shoulder, elbow and hand pain that has gotten progressively worse over the last 2 days.  He has known A fib and ran out of his coumadin.  He was off of it for 3 days and restarted it 2 days ago.  He has movement and sensation is grossly intact.  Pulseless left brachial and radial arteries.  No ulcers noted.  Past medical history includes: tobacco abuse, Hypertension, and A fib.   He takes a Statin and Beta Blocker daily.       Current Facility-Administered Medications  Medication Dose Route Frequency Provider Last Rate Last Dose  . morphine 4 MG/ML injection 4 mg  4 mg Intravenous Once Sharyon Cable, MD       Current Outpatient Prescriptions  Medication Sig Dispense Refill  . clonazePAM (KLONOPIN) 1 MG tablet Take 1 tablet (1 mg total) by mouth 3 (three) times daily. 75 tablet 2  . diltiazem (DILACOR XR) 240 MG 24 hr capsule Take 1 capsule (240 mg total) by mouth daily. 90 capsule 2  . gabapentin (NEURONTIN) 100 MG capsule Take 1 capsule (100 mg total) by mouth at bedtime.    . metoprolol (LOPRESSOR) 100 MG tablet Take 1 tablet (100 mg total) by mouth 2 (two) times daily. 180 tablet 1  . peg 3350 powder (MOVIPREP) 100 G SOLR Take 1 kit (200 g total) by mouth once. 1 kit 0  . pravastatin (PRAVACHOL) 40 MG tablet Take 1 tablet (40 mg total) by mouth daily. 30 tablet 12  . sertraline (ZOLOFT) 50 MG tablet Take 1 tablet (50 mg total) by mouth at bedtime.    . traMADol (ULTRAM) 50 MG tablet Take 50 mg by mouth every 6 (six) hours as needed.    . warfarin (COUMADIN) 5 MG tablet Take as directed by coumadin clinic 40 tablet 3    Pt meds include: Statin :Yes Betablocker: Yes ASA: No Other anticoagulants/antiplatelets: coumadin  Past Medical History  Diagnosis Date  . Atrial fibrillation    . Hypertension   . Hyperlipidemia   . COPD (chronic obstructive pulmonary disease)   . Amaurosis fugax of left eye     history of   . Hx of echocardiogram     Echo (07/09/13):  Mild LVH, EF 55-60%, MAC, mild MR, severe LAE, mod RAE     Social History History  Substance Use Topics  . Smoking status: Current Every Day Smoker -- 2.00 packs/day    Types: Cigarettes  . Smokeless tobacco: Never Used  . Alcohol Use: No    Family History Family History  Problem Relation Age of Onset  . Hypertension Mother   . Cancer Mother   . Cancer Father     gastric cancer  . Cancer Sister   . Early death Neg Hx   . Heart disease Neg Hx   . Hyperlipidemia Neg Hx   . Kidney disease Neg Hx   . Stroke Neg Hx     Allergies  Allergen Reactions  . Aspirin Nausea And Vomiting     REVIEW OF SYSTEMS  General: '[ ]'  Weight loss, '[ ]'  Fever, '[ ]'  chills Neurologic: '[ ]'  Dizziness, '[ ]'  Blackouts, '[ ]'  Seizure '[ ]'  Stroke, '[ ]'  "Mini stroke", '[ ]'  Slurred speech, '[ ]'  Temporary blindness; '[ ]'  weakness in arms  or legs, '[ ]'  Hoarseness '[ ]'  Dysphagia Cardiac: '[ ]'  Chest pain/pressure, '[ ]'  Shortness of breath at rest '[ ]'  Shortness of breath with exertion, '[ ]'  Atrial fibrillation or irregular heartbeat  Vascular: [x ] Pain in legs with walking, '[ ]'  Pain in legs at rest, '[ ]'  Pain in legs at night,  '[ ]'  Non-healing ulcer, '[ ]'  Blood clot in vein/DVT,  '[x]'  left arm pain at rest Pulmonary: '[ ]'  Home oxygen, '[ ]'  Productive cough, '[ ]'  Coughing up blood, '[ ]'  Asthma,  '[ ]'  Wheezing '[ ]'  COPD Musculoskeletal:  '[ ]'  Arthritis, '[ ]'  Low back pain, '[ ]'  Joint pain Hematologic: '[ ]'  Easy Bruising, '[ ]'  Anemia; '[ ]'  Hepatitis Gastrointestinal: '[ ]'  Blood in stool, '[ ]'  Gastroesophageal Reflux/heartburn, Urinary: '[ ]'  chronic Kidney disease, '[ ]'  on HD - '[ ]'  MWF or '[ ]'  TTHS, '[ ]'  Burning with urination, '[ ]'  Difficulty urinating Skin: '[ ]'  Rashes, '[ ]'  Wounds Psychological: '[ ]'  Anxiety, '[ ]'  Depression  Physical Examination Filed Vitals:    04/05/14 1054 04/05/14 1145  BP: 121/88 106/81  Pulse:  30  Temp: 97.4 F (36.3 C)   TempSrc: Oral   Resp: 18 11  SpO2: 100% 94%   There is no weight on file to calculate BMI.  General:  WDWN in NAD Gait: Normal HENT: WNL Eyes: Pupils equal Pulmonary: normal non-labored breathing , without Rales, rhonchi,  wheezing Cardiac: irregularly irregular, without  Murmurs, rubs or gallops; No carotid bruits Abdomen: soft, NT, no masses Skin: no rashes, ulcers noted;  no Gangrene , no cellulitis; no open wounds;   Vascular Exam/Pulses:palpable right radial and brachial, non palpable/ non doppler able brachial and radial/ulnar arteries.  Left hand sensation and motor are intact slight pallor discoloration to palm. Palpable DP/PT pulses bilateral LE.   Musculoskeletal: no muscle wasting or atrophy; no edema  Neurologic: A&O X 3; Appropriate Affect ;  SENSATION: normal; MOTOR FUNCTION: 5/5 Symmetric Speech is fluent/normal   Significant Diagnostic Studies: CBC Lab Results  Component Value Date   WBC 13.4* 04/05/2014   HGB 16.1 04/05/2014   HCT 45.6 04/05/2014   MCV 90.8 04/05/2014   PLT 241 04/05/2014    BMET    Component Value Date/Time   NA 141 03/14/2014 1338   K 4.3 03/14/2014 1338   CL 105 03/14/2014 1338   CO2 26 03/14/2014 1338   GLUCOSE 89 03/14/2014 1338   BUN 9 03/14/2014 1338   CREATININE 0.87 03/14/2014 1338   CREATININE 0.9 06/20/2013 1127   CALCIUM 9.4 03/14/2014 1338   GFRNONAA >89 03/14/2014 1338   GFRNONAA 87* 04/20/2011 1329   GFRAA >89 03/14/2014 1338   GFRAA >90 04/20/2011 1329   CrCl cannot be calculated (Unknown ideal weight.).  COAG Lab Results  Component Value Date   INR 3.0 03/14/2014   INR 2.4 02/12/2014   INR 2.0 01/01/2014     Non-Invasive Vascular Imaging: pending left upper extremity arterial duplex  ASSESSMENT/PLAN:  Left arm ischemia A fib off coumadin for 3 days pending INR Pending arterial duplex he will be taken to the  OR for left arm embolectomy by Dr. Ruta Hinds.   Laurence Slate [facility] 04/05/2014 12:05 PM

## 2014-04-06 ENCOUNTER — Encounter (HOSPITAL_COMMUNITY): Payer: Self-pay | Admitting: *Deleted

## 2014-04-06 ENCOUNTER — Inpatient Hospital Stay (HOSPITAL_COMMUNITY): Payer: Commercial Managed Care - HMO

## 2014-04-06 LAB — BASIC METABOLIC PANEL
ANION GAP: 7 (ref 5–15)
BUN: 6 mg/dL (ref 6–23)
CALCIUM: 8.7 mg/dL (ref 8.4–10.5)
CHLORIDE: 100 mmol/L (ref 96–112)
CO2: 30 mmol/L (ref 19–32)
Creatinine, Ser: 0.82 mg/dL (ref 0.50–1.35)
GFR calc Af Amer: >90 mL/min (ref >90)
GFR calc non Af Amer: >90 mL/min (ref >90)
GLUCOSE: 90 mg/dL (ref 70–99)
Potassium: 3.9 mmol/L (ref 3.5–5.1)
SODIUM: 137 mmol/L (ref 135–145)

## 2014-04-06 LAB — CBC
HEMATOCRIT: 41.9 % (ref 39.0–52.0)
Hemoglobin: 14.4 g/dL (ref 13.0–17.0)
MCH: 31.2 pg (ref 26.0–34.0)
MCHC: 34.4 g/dL (ref 30.0–36.0)
MCV: 90.7 fL (ref 78.0–100.0)
Platelets: 195 10*3/uL (ref 150–400)
RBC: 4.62 MIL/uL (ref 4.22–5.81)
RDW: 13.4 % (ref 11.5–15.5)
WBC: 10.3 10*3/uL (ref 4.0–10.5)

## 2014-04-06 LAB — HEPARIN LEVEL (UNFRACTIONATED): HEPARIN UNFRACTIONATED: 0.13 [IU]/mL — AB (ref 0.30–0.70)

## 2014-04-06 LAB — POCT ACTIVATED CLOTTING TIME: ACTIVATED CLOTTING TIME: 140 s

## 2014-04-06 LAB — TROPONIN I: Troponin I: <0.03 ng/mL (ref <0.031)

## 2014-04-06 LAB — MRSA PCR SCREENING: MRSA BY PCR: NEGATIVE

## 2014-04-06 MED ORDER — IOHEXOL 350 MG/ML SOLN
100.0000 mL | Freq: Once | INTRAVENOUS | Status: AC | PRN
  Administered 2014-04-06: 100 mL via INTRAVENOUS

## 2014-04-06 MED ORDER — ATROPINE SULFATE 0.1 MG/ML IJ SOLN
INTRAMUSCULAR | Status: AC
  Filled 2014-04-06: qty 10

## 2014-04-06 MED ORDER — HEPARIN (PORCINE) IN NACL 100-0.45 UNIT/ML-% IJ SOLN
1400.0000 [IU]/h | INTRAMUSCULAR | Status: DC
  Administered 2014-04-06: 1100 [IU]/h via INTRAVENOUS
  Administered 2014-04-07 – 2014-04-08 (×2): 1400 [IU]/h via INTRAVENOUS
  Filled 2014-04-06 (×6): qty 250

## 2014-04-06 NOTE — Progress Notes (Signed)
ANTICOAGULATION CONSULT NOTE - Follow Up Consult  Pharmacy Consult for heparin Indication: DVT  Labs:  Recent Labs  04/05/14 1119 04/05/14 2351  HGB 16.1  --   HCT 45.6  --   PLT 241  --   APTT 40*  --   LABPROT 16.3*  --   INR 1.30  --   CREATININE 0.92  --   TROPONINI  --  <0.03     Assessment: 64yo male started on heparin for new LUE DVT, held for vascular intervention, sheath now pulled ~0230.  Goal of Therapy:  Heparin level 0.3-0.7 units/ml   Plan:  Will start heparin gtt at 1100 units/hr at 0830 this am and monitor heparin levels and CBC.  Wynona Neat, PharmD, BCPS  04/06/2014,2:41 AM

## 2014-04-06 NOTE — Progress Notes (Signed)
Right groin site level 0 prior to sheath pull. Arterial femoral sheath pulled. Manual pressure held for 20 min., hemostasis achieved. Pressure dressing applied. Patient educated on holding pressure when coughing, sneezing, or bearing down in any manner. Patient demonstrated understanding. No complications during sheath pull. Vitals stable. Groin site level 0 at this time. Will continue to monitor.

## 2014-04-06 NOTE — Progress Notes (Signed)
CT images review.  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 Will need definitive repair of aneurysm most likely 2/29.  Will need to keep heparin going until then.  Ruta Hinds, MD [facility] [place] Office: [phone] Pager: [phone]

## 2014-04-06 NOTE — Progress Notes (Signed)
ANTICOAGULATION CONSULT NOTE - Follow Up Consult  Pharmacy Consult for Heparin Indication: DVT  Allergies  Allergen Reactions  . Aspirin Nausea And Vomiting    Patient Measurements: Height: 5\' 10"  (177.8 cm) Weight: 174 lb 13.2 oz (79.3 kg) IBW/kg (Calculated) : 73 Heparin Dosing Weight: 79.3 kg  Vital Signs: Temp: 98.5 F (36.9 C) (02/27 1346) Temp Source: Oral (02/27 1346) BP: 139/81 mmHg (02/27 1346) Pulse Rate: 66 (02/27 1346)  Labs:  Recent Labs  04/05/14 1119 04/05/14 2351 04/06/14 0536 04/06/14 1455  HGB 16.1  --  14.4  --   HCT 45.6  --  41.9  --   PLT 241  --  195  --   APTT 40*  --   --   --   LABPROT 16.3*  --   --   --   INR 1.30  --   --   --   HEPARINUNFRC  --   --   --  0.13*  CREATININE 0.92  --  0.82  --   TROPONINI  --  <0.03 <0.03  --     Estimated Creatinine Clearance: 94 mL/min (by C-G formula based on Cr of 0.82).   Medications:  Heparin @ 1100 units/hr (11 ml/hr)  Assessment: 29 YOM who continues on heparin for a new LUE DVT s/p intervention on 2/26 with a SUBtherapeutic heparin level this afternoon (HL 0.13, goal of 0.3-0.7). CBC wnl - no overt signs or symptoms of bleeding noted.   Goal of Therapy:  Heparin level 0.3-0.7 units/ml Monitor platelets by anticoagulation protocol: Yes   Plan:  1. Increase heparin to 1350 units/hr (13.5 ml/hr) 2. Will continue to monitor for any signs/symptoms of bleeding and will follow up with heparin level in 6 hours   Alycia Rossetti, PharmD, BCPS Clinical Pharmacist Pager: [phone] 04/06/2014 4:26 PM

## 2014-04-06 NOTE — Evaluation (Signed)
Occupational Therapy Evaluation Patient Details Name: [name] MRN: [id number] DOB: [birth year] Today's Date: 04/06/2014    History of Present Illness 64 y.o. male presenting with musculoskeletal pain left arm. Patient wit + DVT, now s/p  Left Radial artery cut down and radial artery thrombectomy.    Clinical Impression   This 64 yo male admitted and underwent above (with another procedure on Monday per chart) presents to acute OT with increased pain in LUE, decreased use of LUE, decreased AROM of LUE, and decreased balance all affecting his ability to safely care for himself at home. He will benefit from a short stay at [facility].    Follow Up Recommendations  SNF    Equipment Recommendations  None recommended by OT       Precautions / Restrictions Precautions Precautions: Fall Restrictions Weight Bearing Restrictions: No      Mobility Bed Mobility Overal bed mobility: Needs Assistance Bed Mobility: Sit to Supine     Sit to supine: Supervision      Transfers Overall transfer level: Needs assistance Equipment used: 1 person hand held assist Transfers: Sit to/from Stand Sit to Stand: Min assist            Balance Overall balance assessment: Needs assistance Sitting-balance support: Feet supported Sitting balance-Leahy Scale: Good     Standing balance support: No upper extremity supported;During functional activity Standing balance-Leahy Scale: Poor                        ADL Overall ADL's : Needs assistance/impaired Eating/Feeding: Modified independent;Sitting   Grooming: Min guard;Standing   Upper Body Bathing: Set up;Sitting   Lower Body Bathing: Min guard;Sit to/from stand   Upper Body Dressing : Set up;Sitting   Lower Body Dressing: Min guard;Sit to/from stand   Toilet Transfer: Minimal assistance;Ambulation   Toileting- Clothing Manipulation and Hygiene: Min guard;Sit to/from stand                         Pertinent  Vitals/Pain Pain Assessment: 0-10 Pain Score: 8  Pain Location: left arm Pain Descriptors / Indicators: Aching;Sore Pain Intervention(s): Monitored during session;Repositioned     Hand Dominance Right   Extremity/Trunk Assessment Upper Extremity Assessment Upper Extremity Assessment: LUE deficits/detail LUE Deficits / Details: Decreased AROM of elbow flex/ext (full PROM/SROM); decreased AROM and SROM of shoulder flexion (all in sitting) LUE Coordination: decreased gross motor         Communication Communication Communication: No difficulties   Cognition Arousal/Alertness: Awake/alert Behavior During Therapy: WFL for tasks assessed/performed Overall Cognitive Status: Impaired/Different from baseline Area of Impairment: Safety/judgement;Awareness         Safety/Judgement: Decreased awareness of safety;Decreased awareness of deficits Awareness: Emergent          Exercises   Other Exercises Other Exercises: LUE: Had pt perform 10 reps of AAROM/SROM for elbow flexion/extension and 5 reps of AAROM/SROM for shoulder flexion/extension while seated in recliner. I recommended to him that he needed to do 1 set of each of these every waking hour-he verbalized understanding        Home Living Family/patient expects to be discharged to:: Private residence Living Arrangements: Alone Available Help at Discharge: Neighbor;Available PRN/intermittently   Home Access: Stairs to enter Entrance Stairs-Number of Steps: 4 Entrance Stairs-Rails: None Home Layout: One level     Bathroom Shower/Tub: Tub/shower unit;Curtain Shower/tub characteristics: Architectural technologist: Standard     Home Equipment: None  Prior Functioning/Environment Level of Independence: Independent             OT Diagnosis: Generalized weakness;Acute pain   OT Problem List: Decreased strength;Decreased range of motion;Impaired UE functional use;Pain;Impaired balance (sitting and/or  standing)   OT Treatment/Interventions: Self-care/ADL training;Therapeutic exercise;Therapeutic activities;DME and/or AE instruction;Balance training;Patient/family education    OT Goals(Current goals can be found in the care plan section) Acute Rehab OT Goals Patient Stated Goal: to go home and back to working on [employer] OT Goal Formulation: With patient Time For Goal Achievement: 04/20/14 Potential to Achieve Goals: Good  OT Frequency: Min 3X/week   Barriers to D/C: Decreased caregiver support             End of Session Nurse Communication:  (One of pt's IV lines said it was paused)  Activity Tolerance: Patient limited by lethargy Patient left: in bed;with call bell/phone within reach;with bed alarm set   Time: 9987-2158 OT Time Calculation (min): 9 min Charges:  OT General Charges $OT Visit: 1 Procedure OT Evaluation $Initial OT Evaluation Tier I: 1 Procedure  Almon Register [phone] 04/06/2014, 4:10 PM

## 2014-04-06 NOTE — Evaluation (Signed)
Physical Therapy Evaluation Patient Details Name: [name] MRN: [id number] DOB: [birth year] Today's Date: 04/06/2014   History of Present Illness  64 y.o. male presenting with musculoskeletal pain left arm. Patient wit + DVT, now s/p  Left Radial artery cut down and radial artery thrombectomy.   Clinical Impression  Patient demonstrates deficits in functional mobility as indicated below. Will need continued skilled PT to address deficits and maximize function. Will see as indicated and progress as tolerated. Patient high fall risk, will need ST SNF upon acute discharge.    Follow Up Recommendations SNF    Equipment Recommendations   (tbd)    Recommendations for Other Services       Precautions / Restrictions Precautions Precautions: Fall Restrictions Weight Bearing Restrictions: No      Mobility  Bed Mobility Overal bed mobility: Needs Assistance Bed Mobility: Supine to Sit     Supine to sit: Min assist        Transfers Overall transfer level: Needs assistance Equipment used: 1 person hand held assist Transfers: Sit to/from Stand Sit to Stand: Min assist         General transfer comment: VCs for positioning and safety, assist for stability  Ambulation/Gait Ambulation/Gait assistance: Min assist (with ocassional moderate assist to prevent LOB and fall) Ambulation Distance (Feet): 140 Feet Assistive device: 1 person hand held assist Gait Pattern/deviations: Step-through pattern;Decreased stride length;Narrow base of support;Drifts right/left Gait velocity: decreased Gait velocity interpretation: Below normal speed for age/gender General Gait Details: very unsteady with gait, poor safety awareness and recognition of need for assist. 3 noted LOB requiring increased assist to self correct. Unsure if related to medications or true unsteadiness  Stairs            Wheelchair Mobility    Modified Rankin (Stroke Patients Only)       Balance  Overall balance assessment: Needs assistance Sitting-balance support: Feet supported Sitting balance-Leahy Scale: Good     Standing balance support: No upper extremity supported;During functional activity Standing balance-Leahy Scale: Poor               High level balance activites: Braiding;Direction changes;Turns High Level Balance Comments: min assist for stability             Pertinent Vitals/Pain Pain Assessment: 0-10 Pain Score: 8  Pain Intervention(s): Monitored during session;Limited activity within patient's tolerance;Repositioned    Home Living Family/patient expects to be discharged to:: Private residence Living Arrangements: Alone Available Help at Discharge: Neighbor;Available PRN/intermittently   Home Access: Stairs to enter Entrance Stairs-Rails: None Entrance Stairs-Number of Steps: 4 Home Layout: One level Home Equipment: None      Prior Function Level of Independence: Independent               Hand Dominance   Dominant Hand: Right    Extremity/Trunk Assessment               Lower Extremity Assessment: Generalized weakness         Communication      Cognition Arousal/Alertness: Awake/alert Behavior During Therapy: WFL for tasks assessed/performed;Impulsive Overall Cognitive Status: Impaired/Different from baseline Area of Impairment: Safety/judgement;Awareness         Safety/Judgement: Decreased awareness of safety;Decreased awareness of deficits Awareness: Emergent        General Comments      Exercises        Assessment/Plan    PT Assessment Patient needs continued PT services  PT Diagnosis Difficulty walking;Abnormality of gait;Acute pain  PT Problem List Decreased strength;Decreased activity tolerance;Decreased balance;Decreased mobility;Decreased safety awareness  PT Treatment Interventions DME instruction;Gait training;Stair training;Functional mobility training;Therapeutic activities;Therapeutic  exercise;Balance training;Patient/family education   PT Goals (Current goals can be found in the Care Plan section) Acute Rehab PT Goals Patient Stated Goal: to go home PT Goal Formulation: With patient Time For Goal Achievement: 04/20/14 Potential to Achieve Goals: Fair    Frequency Min 3X/week   Barriers to discharge Decreased caregiver support      Co-evaluation               End of Session   Activity Tolerance: Patient limited by pain Patient left: in chair;Other (comment) (with OT) Nurse Communication: Mobility status;Precautions         Time: 9166-0600 PT Time Calculation (min) (ACUTE ONLY): 16 min   Charges:   PT Evaluation $Initial PT Evaluation Tier I: 1 Procedure     PT G CodesDuncan Dull 04/07/2014, 3:54 PM Alben Deeds, [place] DPT  [phone]

## 2014-04-06 NOTE — Progress Notes (Addendum)
[facility] [place]  Subjective  - Hand feels much better   Objective 130/86 67 98.9 F (37.2 C) (Oral) 12 98%  Intake/Output Summary (Last 24 hours) at 04/06/14 0805 Last data filed at 04/06/14 0700  Gross per 24 hour  Intake 2082.5 ml  Output    800 ml  Net 1282.5 ml   Left arm incisions intact some periincisional bruising, hand warm, subjectively sensation improved but still with numbness in finger tips Pt has some baseline weakness in his left arm which he states is baseline from accident several years ago Right groin no hematoma No doppler currently available on unit  Assessment/Planning: S/p left brachial endarterectomy with patch, probable subclavian artery process as well, hand much improved today and certainly viable CTA today Resume heparin drip this morning Transfer 2 w  [name] 04/06/2014 8:05 AM --  Laboratory Lab Results:  Recent Labs  04/05/14 1119 04/06/14 0536  WBC 13.4* 10.3  HGB 16.1 14.4  HCT 45.6 41.9  PLT 241 195   BMET  Recent Labs  04/05/14 1119 04/06/14 0536  NA 136 137  K 3.8 3.9  CL 101 100  CO2 28 30  GLUCOSE 116* 90  BUN 9 6  CREATININE 0.92 0.82  CALCIUM 9.3 8.7    COAG Lab Results  Component Value Date   INR 1.30 04/05/2014   INR 3.0 03/14/2014   INR 2.4 02/12/2014   No results found for: PTT

## 2014-04-07 ENCOUNTER — Inpatient Hospital Stay (HOSPITAL_COMMUNITY): Payer: Commercial Managed Care - HMO

## 2014-04-07 DIAGNOSIS — I749 Embolism and thrombosis of unspecified artery: Secondary | ICD-10-CM

## 2014-04-07 DIAGNOSIS — I999 Unspecified disorder of circulatory system: Secondary | ICD-10-CM

## 2014-04-07 LAB — CBC
HCT: 42.5 % (ref 39.0–52.0)
HEMOGLOBIN: 14.7 g/dL (ref 13.0–17.0)
MCH: 32 pg (ref 26.0–34.0)
MCHC: 34.6 g/dL (ref 30.0–36.0)
MCV: 92.4 fL (ref 78.0–100.0)
PLATELETS: 224 10*3/uL (ref 150–400)
RBC: 4.6 MIL/uL (ref 4.22–5.81)
RDW: 13.4 % (ref 11.5–15.5)
WBC: 11.3 10*3/uL — ABNORMAL HIGH (ref 4.0–10.5)

## 2014-04-07 LAB — HEPARIN LEVEL (UNFRACTIONATED)
HEPARIN UNFRACTIONATED: 0.32 [IU]/mL (ref 0.30–0.70)
HEPARIN UNFRACTIONATED: 0.37 [IU]/mL (ref 0.30–0.70)

## 2014-04-07 MED ORDER — SODIUM CHLORIDE 0.9 % IV SOLN: Freq: Once | INTRAVENOUS | Status: DC

## 2014-04-07 MED ORDER — CEFAZOLIN SODIUM 1-5 GM-% IV SOLN
1.0000 g | INTRAVENOUS | Status: AC
  Administered 2014-04-08: 1 g via INTRAVENOUS
  Filled 2014-04-07: qty 50

## 2014-04-07 NOTE — Progress Notes (Signed)
VASCULAR LAB PRELIMINARY  PRELIMINARY  PRELIMINARY  PRELIMINARY  Right Lower Extremity Vein Map    Right Great Saphenous Vein   Segment Diameter Comment  1. Origin 7.7 mm Bandages, Rouleaux flow  2. High Thigh 4.5 mm   3. Mid Thigh 3.4 mm   4. Low Thigh 3.8 mm   5. At Knee 2.5 mm   6. High Calf 3.4 mm   7. Low Calf 3.4 mm   8. Ankle 3.2 mm    mm    mm    mm    Left Lower Extremity Vein Map    Left Great Saphenous Vein   Segment Diameter Comment  1. Origin 6.6 mm   2. High Thigh 4.1 mm   3. Mid Thigh 3.7 mm   4. Low Thigh 2.5 mm   5. At Knee 3.5 mm Branch   6. High Calf 2.9 mm   7. Low Calf 2.7 mm Branch   8. Ankle 3.2 mm    mm    mm    mm       Tellefsen, RVT 04/07/2014, 2:04 PM

## 2014-04-07 NOTE — Progress Notes (Addendum)
Patients sister took wallet, keys, cell phone, watch and check book home.  Rowe Pavy, RN

## 2014-04-07 NOTE — Progress Notes (Signed)
[facility] [place]  Subjective  - left arm hurts some up near shoulder   Objective 121/92 81 97.8 F (36.6 C) (Oral) 18 94%  Intake/Output Summary (Last 24 hours) at 04/07/14 3888 Last data filed at 04/06/14 1839  Gross per 24 hour  Intake    120 ml  Output      0 ml  Net    120 ml   Left hand pink, warm, incisions healing, no palpable pulse  Assessment/Planning: Left arm/hand ischemia improved but will need repair of left axillary artery aneurysm Plan to OR tomorrow.  Will expose left distal subclavian/axillary artery infraclavicular as well as left upper arm incision and most likely harvest saphenous for conduit.  Vein map today legs  Consent  NPO p midnight D/c heparin on call to OR tomorrow   FIELDS,CHARLES E 04/07/2014 8:42 AM --  Laboratory Lab Results:  Recent Labs  04/06/14 0536 04/07/14 0444  WBC 10.3 11.3*  HGB 14.4 14.7  HCT 41.9 42.5  PLT 195 224   BMET  Recent Labs  04/05/14 1119 04/06/14 0536  NA 136 137  K 3.8 3.9  CL 101 100  CO2 28 30  GLUCOSE 116* 90  BUN 9 6  CREATININE 0.92 0.82  CALCIUM 9.3 8.7    COAG Lab Results  Component Value Date   INR 1.30 04/05/2014   INR 3.0 03/14/2014   INR 2.4 02/12/2014   No results found for: PTT

## 2014-04-07 NOTE — Progress Notes (Signed)
Bilateral lower extremity arterial duplex completed.  No evidence of aneurysms.

## 2014-04-07 NOTE — Progress Notes (Signed)
ANTICOAGULATION CONSULT NOTE - Follow Up Consult  Pharmacy Consult for heparin Indication: DVT  Labs:  Recent Labs  04/05/14 1119 04/05/14 2351 04/06/14 0536 04/06/14 1455 04/06/14 2333  HGB 16.1  --  14.4  --   --   HCT 45.6  --  41.9  --   --   PLT 241  --  195  --   --   APTT 40*  --   --   --   --   LABPROT 16.3*  --   --   --   --   INR 1.30  --   --   --   --   HEPARINUNFRC  --   --   --  0.13* 0.32  CREATININE 0.92  --  0.82  --   --   TROPONINI  --  <0.03 <0.03  --   --      Assessment/Plan:  64yo male therapeutic on heparin after rate increase though at very low end of goal. Will increase gtt very slightly to 1400 units/hr and confirm stable with am labs.   Wynona Neat, PharmD, BCPS  04/07/2014,12:24 AM

## 2014-04-07 NOTE — Progress Notes (Signed)
ANTICOAGULATION CONSULT NOTE - Follow Up Consult  Pharmacy Consult for Heparin Indication: DVT  Allergies  Allergen Reactions  . Aspirin Nausea And Vomiting    Patient Measurements: Height: 5\' 10"  (177.8 cm) Weight: 171 lb 4.8 oz (77.7 kg) IBW/kg (Calculated) : 73 Heparin Dosing Weight: 79.3 kg  Vital Signs: Temp: 97.8 F (36.6 C) (02/28 0449) Temp Source: Oral (02/28 0449) BP: 121/92 mmHg (02/28 0449) Pulse Rate: 81 (02/28 0449)  Labs:  Recent Labs  04/05/14 1119 04/05/14 2351 04/06/14 0536 04/06/14 1455 04/06/14 2333 04/07/14 0444  HGB 16.1  --  14.4  --   --  14.7  HCT 45.6  --  41.9  --   --  42.5  PLT 241  --  195  --   --  224  APTT 40*  --   --   --   --   --   LABPROT 16.3*  --   --   --   --   --   INR 1.30  --   --   --   --   --   HEPARINUNFRC  --   --   --  0.13* 0.32 0.37  CREATININE 0.92  --  0.82  --   --   --   TROPONINI  --  <0.03 <0.03  --   --   --     Estimated Creatinine Clearance: 94 mL/min (by C-G formula based on Cr of 0.82).   Medications:  Heparin @ 1400 units/hr (14 ml/hr)  Assessment: 53 YOM who continues on heparin for a new LUE DVT s/p intervention on 2/26. Heparin level this morning remains therapeutic (HL 0.37 << 0.31, goal of 0.3-0.7). CBC wnl - no overt signs or symptoms of bleeding noted.   Goal of Therapy:  Heparin level 0.3-0.7 units/ml Monitor platelets by anticoagulation protocol: Yes   Plan:  1. Continue heparin at the current rate of 1400 units/hr (14 ml/hr) 2. Will continue to monitor for any signs/symptoms of bleeding and will follow up with heparin level in the a.m.   Alycia Rossetti, PharmD, BCPS Clinical Pharmacist Pager: [phone] 04/07/2014 1:48 PM

## 2014-04-08 ENCOUNTER — Encounter (HOSPITAL_COMMUNITY)
Admission: EM | Disposition: A | Discharge status: Home / Self Care | Payer: Self-pay | Source: Home / Self Care | Attending: Emergency Medicine

## 2014-04-08 ENCOUNTER — Inpatient Hospital Stay (HOSPITAL_COMMUNITY): Payer: Commercial Managed Care - HMO | Admitting: Anesthesiology

## 2014-04-08 ENCOUNTER — Encounter (HOSPITAL_COMMUNITY): Payer: Self-pay | Admitting: Vascular Surgery

## 2014-04-08 DIAGNOSIS — I721 Aneurysm of artery of upper extremity: Secondary | ICD-10-CM

## 2014-04-08 HISTORY — PX: AXILLARY-FEMORAL BYPASS GRAFT: SHX894

## 2014-04-08 HISTORY — PX: VEIN HARVEST: SHX6363

## 2014-04-08 LAB — BASIC METABOLIC PANEL
Anion gap: 9 (ref 5–15)
BUN: 9 mg/dL (ref 6–23)
CHLORIDE: 103 mmol/L (ref 96–112)
CO2: 27 mmol/L (ref 19–32)
Calcium: 9.3 mg/dL (ref 8.4–10.5)
Creatinine, Ser: 0.8 mg/dL (ref 0.50–1.35)
GFR calc non Af Amer: >90 mL/min (ref >90)
GLUCOSE: 100 mg/dL — AB (ref 70–99)
Potassium: 3.9 mmol/L (ref 3.5–5.1)
Sodium: 139 mmol/L (ref 135–145)

## 2014-04-08 LAB — CBC
HCT: 42.2 % (ref 39.0–52.0)
HEMOGLOBIN: 14.4 g/dL (ref 13.0–17.0)
MCH: 31.3 pg (ref 26.0–34.0)
MCHC: 34.1 g/dL (ref 30.0–36.0)
MCV: 91.7 fL (ref 78.0–100.0)
Platelets: 230 10*3/uL (ref 150–400)
RBC: 4.6 MIL/uL (ref 4.22–5.81)
RDW: 13.5 % (ref 11.5–15.5)
WBC: 7.3 10*3/uL (ref 4.0–10.5)

## 2014-04-08 LAB — PROTIME-INR
INR: 1.11 (ref 0.00–1.49)
PROTHROMBIN TIME: 14.5 s (ref 11.6–15.2)

## 2014-04-08 LAB — HEPARIN LEVEL (UNFRACTIONATED): HEPARIN UNFRACTIONATED: 0.3 [IU]/mL (ref 0.30–0.70)

## 2014-04-08 LAB — PREPARE RBC (CROSSMATCH)

## 2014-04-08 SURGERY — CREATION, BYPASS, ARTERIAL, AXILLARY TO BILATERAL FEMORAL, USING GRAFT
Anesthesia: General | Site: Leg Upper | Laterality: Left

## 2014-04-08 MED ORDER — THROMBIN 20000 UNITS EX SOLR
CUTANEOUS | Status: AC
  Filled 2014-04-08: qty 40000

## 2014-04-08 MED ORDER — PROPOFOL 10 MG/ML IV BOLUS
INTRAVENOUS | Status: DC | PRN
  Administered 2014-04-08: 120 mg via INTRAVENOUS

## 2014-04-08 MED ORDER — 0.9 % SODIUM CHLORIDE (POUR BTL) OPTIME
TOPICAL | Status: DC | PRN
Start: 2014-04-08 — End: 2014-04-08
  Administered 2014-04-08 (×2): 1000 mL

## 2014-04-08 MED ORDER — DEXAMETHASONE SODIUM PHOSPHATE 10 MG/ML IJ SOLN
INTRAMUSCULAR | Status: AC
  Filled 2014-04-08: qty 1

## 2014-04-08 MED ORDER — FENTANYL CITRATE 0.05 MG/ML IJ SOLN
INTRAMUSCULAR | Status: DC | PRN
  Administered 2014-04-08 (×3): 50 ug via INTRAVENOUS
  Administered 2014-04-08: 100 ug via INTRAVENOUS
  Administered 2014-04-08 (×2): 50 ug via INTRAVENOUS

## 2014-04-08 MED ORDER — PROPOFOL 10 MG/ML IV BOLUS
INTRAVENOUS | Status: AC
  Filled 2014-04-08: qty 20

## 2014-04-08 MED ORDER — LIDOCAINE HCL (CARDIAC) 20 MG/ML IV SOLN
INTRAVENOUS | Status: AC
  Filled 2014-04-08: qty 5

## 2014-04-08 MED ORDER — FENTANYL CITRATE 0.05 MG/ML IJ SOLN
INTRAMUSCULAR | Status: AC
  Filled 2014-04-08: qty 5

## 2014-04-08 MED ORDER — MEPERIDINE HCL 25 MG/ML IJ SOLN: 6.2500 mg | INTRAMUSCULAR | Status: DC | PRN

## 2014-04-08 MED ORDER — MIDAZOLAM HCL 2 MG/2ML IJ SOLN
INTRAMUSCULAR | Status: AC
  Filled 2014-04-08: qty 2

## 2014-04-08 MED ORDER — SODIUM CHLORIDE 0.9 % IV SOLN
INTRAVENOUS | Status: DC
  Administered 2014-04-08: 21:00:00 via INTRAVENOUS

## 2014-04-08 MED ORDER — LACTATED RINGERS IV SOLN
INTRAVENOUS | Status: DC
Start: 2014-04-08 — End: 2014-04-08
  Administered 2014-04-08: 12:00:00 via INTRAVENOUS

## 2014-04-08 MED ORDER — PROMETHAZINE HCL 25 MG/ML IJ SOLN: 6.2500 mg | INTRAMUSCULAR | Status: DC | PRN

## 2014-04-08 MED ORDER — MIDAZOLAM HCL 5 MG/5ML IJ SOLN
INTRAMUSCULAR | Status: DC | PRN
  Administered 2014-04-08: 1 mg via INTRAVENOUS
  Administered 2014-04-08: 0.5 mg via INTRAVENOUS

## 2014-04-08 MED ORDER — LACTATED RINGERS IV SOLN
INTRAVENOUS | Status: DC | PRN
  Administered 2014-04-08 (×3): via INTRAVENOUS

## 2014-04-08 MED ORDER — FENTANYL CITRATE 0.05 MG/ML IJ SOLN
INTRAMUSCULAR | Status: AC
  Administered 2014-04-08: 25 ug via INTRAVENOUS
  Filled 2014-04-08: qty 2

## 2014-04-08 MED ORDER — ONDANSETRON HCL 4 MG/2ML IJ SOLN
INTRAMUSCULAR | Status: AC
  Filled 2014-04-08: qty 2

## 2014-04-08 MED ORDER — GLYCOPYRROLATE 0.2 MG/ML IJ SOLN
INTRAMUSCULAR | Status: AC
  Filled 2014-04-08: qty 1

## 2014-04-08 MED ORDER — GLYCOPYRROLATE 0.2 MG/ML IJ SOLN
INTRAMUSCULAR | Status: AC
  Filled 2014-04-08: qty 2

## 2014-04-08 MED ORDER — LIDOCAINE HCL (CARDIAC) 20 MG/ML IV SOLN
INTRAVENOUS | Status: DC | PRN
  Administered 2014-04-08: 100 mg via INTRAVENOUS

## 2014-04-08 MED ORDER — GLYCOPYRROLATE 0.2 MG/ML IJ SOLN
INTRAMUSCULAR | Status: DC | PRN
  Administered 2014-04-08: 0.6 mg via INTRAVENOUS

## 2014-04-08 MED ORDER — NEOSTIGMINE METHYLSULFATE 10 MG/10ML IV SOLN
INTRAVENOUS | Status: AC
  Filled 2014-04-08: qty 1

## 2014-04-08 MED ORDER — HEPARIN SODIUM (PORCINE) 1000 UNIT/ML IJ SOLN
INTRAMUSCULAR | Status: DC | PRN
  Administered 2014-04-08: 7000 [IU] via INTRAVENOUS

## 2014-04-08 MED ORDER — ROCURONIUM BROMIDE 50 MG/5ML IV SOLN
INTRAVENOUS | Status: AC
  Filled 2014-04-08: qty 1

## 2014-04-08 MED ORDER — ALBUTEROL SULFATE (2.5 MG/3ML) 0.083% IN NEBU: 3.0000 mL | INHALATION_SOLUTION | Freq: Four times a day (QID) | RESPIRATORY_TRACT | Status: DC | PRN

## 2014-04-08 MED ORDER — HEPARIN (PORCINE) IN NACL 100-0.45 UNIT/ML-% IJ SOLN
1500.0000 [IU]/h | INTRAMUSCULAR | Status: AC
  Administered 2014-04-08: 1400 [IU]/h via INTRAVENOUS
  Filled 2014-04-08 (×2): qty 250

## 2014-04-08 MED ORDER — ROCURONIUM BROMIDE 100 MG/10ML IV SOLN
INTRAVENOUS | Status: DC | PRN
  Administered 2014-04-08: 10 mg via INTRAVENOUS
  Administered 2014-04-08: 40 mg via INTRAVENOUS

## 2014-04-08 MED ORDER — OXYCODONE-ACETAMINOPHEN 5-325 MG PO TABS
1.0000 | ORAL_TABLET | ORAL | Status: DC | PRN
  Administered 2014-04-08: 1 via ORAL
  Administered 2014-04-09 (×2): 2 via ORAL
  Filled 2014-04-08: qty 1
  Filled 2014-04-08 (×2): qty 2

## 2014-04-08 MED ORDER — NEOSTIGMINE METHYLSULFATE 10 MG/10ML IV SOLN
INTRAVENOUS | Status: DC | PRN
  Administered 2014-04-08: 4 mg via INTRAVENOUS

## 2014-04-08 MED ORDER — PHENYLEPHRINE HCL 10 MG/ML IJ SOLN
INTRAMUSCULAR | Status: DC | PRN
  Administered 2014-04-08: 40 ug via INTRAVENOUS
  Administered 2014-04-08: 80 ug via INTRAVENOUS

## 2014-04-08 MED ORDER — ONDANSETRON HCL 4 MG/2ML IJ SOLN
INTRAMUSCULAR | Status: DC | PRN
  Administered 2014-04-08: 4 mg via INTRAVENOUS

## 2014-04-08 MED ORDER — SODIUM CHLORIDE 0.9 % IR SOLN
Status: DC | PRN
  Administered 2014-04-08: 500 mL

## 2014-04-08 MED ORDER — DEXTROSE 5 % IV SOLN
INTRAVENOUS | Status: DC | PRN
  Administered 2014-04-08: 13:00:00 via INTRAVENOUS

## 2014-04-08 MED ORDER — ALBUMIN HUMAN 5 % IV SOLN
INTRAVENOUS | Status: DC | PRN
  Administered 2014-04-08: 15:00:00 via INTRAVENOUS

## 2014-04-08 MED ORDER — FENTANYL CITRATE 0.05 MG/ML IJ SOLN
25.0000 ug | INTRAMUSCULAR | Status: DC | PRN
  Administered 2014-04-08 (×2): 25 ug via INTRAVENOUS
  Administered 2014-04-08: 50 ug via INTRAVENOUS

## 2014-04-08 MED ORDER — PROTAMINE SULFATE 10 MG/ML IV SOLN
INTRAVENOUS | Status: DC | PRN
  Administered 2014-04-08 (×7): 10 mg via INTRAVENOUS

## 2014-04-08 MED FILL — Thrombin For Soln 20000 Unit: CUTANEOUS | Qty: 1 | Status: AC

## 2014-04-08 SURGICAL SUPPLY — 53 items
CANISTER SUCTION 2500CC (MISCELLANEOUS) ×2 IMPLANT
CANNULA VESSEL 3MM 2 BLNT TIP (CANNULA) ×6 IMPLANT
CATH EMB 4FR 40CM (CATHETERS) ×2 IMPLANT
CLIP TI MEDIUM 24 (CLIP) ×2 IMPLANT
CLIP TI WIDE RED SMALL 24 (CLIP) ×2 IMPLANT
CONT SPEC 4OZ CLIKSEAL STRL BL (MISCELLANEOUS) ×4 IMPLANT
COVER SURGICAL LIGHT HANDLE (MISCELLANEOUS) IMPLANT
DRAIN SNY 10X20 3/4 PERF (WOUND CARE) IMPLANT
DRAPE INCISE IOBAN 66X45 STRL (DRAPES) ×4 IMPLANT
DRAPE ORTHO SPLIT 77X108 STRL (DRAPES) ×1
DRAPE SURG ORHT 6 SPLT 77X108 (DRAPES) ×1 IMPLANT
DRSG COVADERM 4X10 (GAUZE/BANDAGES/DRESSINGS) IMPLANT
DRSG COVADERM 4X8 (GAUZE/BANDAGES/DRESSINGS) IMPLANT
ELECT REM PT RETURN 9FT ADLT (ELECTROSURGICAL) ×4
ELECTRODE REM PT RTRN 9FT ADLT (ELECTROSURGICAL) ×2 IMPLANT
EVACUATOR SILICONE 100CC (DRAIN) IMPLANT
GLOVE BIO SURGEON STRL SZ 6.5 (GLOVE) ×4 IMPLANT
GLOVE BIO SURGEON STRL SZ7.5 (GLOVE) ×4 IMPLANT
GLOVE BIOGEL PI IND STRL 6.5 (GLOVE) ×6 IMPLANT
GLOVE BIOGEL PI IND STRL 7.0 (GLOVE) ×2 IMPLANT
GLOVE BIOGEL PI INDICATOR 6.5 (GLOVE) ×6
GLOVE BIOGEL PI INDICATOR 7.0 (GLOVE) ×2
GLOVE ECLIPSE 6.5 STRL STRAW (GLOVE) ×4 IMPLANT
GLOVE SS BIOGEL STRL SZ 7.5 (GLOVE) ×1 IMPLANT
GLOVE SUPERSENSE BIOGEL SZ 7.5 (GLOVE) ×1
GLOVE SURG SS PI 7.0 STRL IVOR (GLOVE) ×6 IMPLANT
GOWN STRL REUS W/ TWL LRG LVL3 (GOWN DISPOSABLE) ×6 IMPLANT
GOWN STRL REUS W/TWL LRG LVL3 (GOWN DISPOSABLE) ×6
KIT BASIN OR (CUSTOM PROCEDURE TRAY) ×2 IMPLANT
KIT ROOM TURNOVER OR (KITS) ×2 IMPLANT
LIQUID BAND (GAUZE/BANDAGES/DRESSINGS) ×6 IMPLANT
NS IRRIG 1000ML POUR BTL (IV SOLUTION) ×4 IMPLANT
PACK PERIPHERAL VASCULAR (CUSTOM PROCEDURE TRAY) ×2 IMPLANT
PAD ARMBOARD 7.5X6 YLW CONV (MISCELLANEOUS) ×4 IMPLANT
SPONGE SURGIFOAM ABS GEL 100 (HEMOSTASIS) IMPLANT
STAPLER VISISTAT 35W (STAPLE) ×2 IMPLANT
STOCKINETTE 6  STRL (DRAPES) ×1
STOCKINETTE 6 STRL (DRAPES) ×1 IMPLANT
SUT PROLENE 3 0 SH DA (SUTURE) IMPLANT
SUT PROLENE 5 0 C 1 24 (SUTURE) ×4 IMPLANT
SUT PROLENE 6 0 C 1 24 (SUTURE) ×4 IMPLANT
SUT PROLENE 6 0 CC (SUTURE) ×4 IMPLANT
SUT SILK 2 0 FS (SUTURE) ×2 IMPLANT
SUT SILK 3 0 (SUTURE) ×2
SUT SILK 3-0 18XBRD TIE 12 (SUTURE) ×2 IMPLANT
SUT VIC AB 2-0 CTX 36 (SUTURE) ×4 IMPLANT
SUT VIC AB 3-0 SH 27 (SUTURE) ×6
SUT VIC AB 3-0 SH 27X BRD (SUTURE) ×6 IMPLANT
SUT VICRYL 4-0 PS2 18IN ABS (SUTURE) ×4 IMPLANT
SYR 3ML LL SCALE MARK (SYRINGE) ×2 IMPLANT
TAPE UMBILICAL COTTON 1/8X30 (MISCELLANEOUS) ×2 IMPLANT
TRAY FOLEY CATH 16FRSI W/METER (SET/KITS/TRAYS/PACK) ×2 IMPLANT
WATER STERILE IRR 1000ML POUR (IV SOLUTION) ×2 IMPLANT

## 2014-04-08 NOTE — Progress Notes (Signed)
Pt not returning to 2W, pt belongs given to sister Shauna Hugh, she is waiting in surgical waiting room. 5:13 PM Sherrie Mustache

## 2014-04-08 NOTE — H&P (View-Only) (Signed)
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  Will expose left distal subclavian/axillary artery infraclavicular as well as left upper arm incision and most likely harvest saphenous for conduit.  Vein map today legs  Consent  NPO p midnight D/c heparin on call to OR tomorrow   Philip Kotlyar E 04/07/2014 8:42 AM --  Laboratory Lab Results:  Recent Labs  04/06/14 0536 04/07/14 0444  WBC 10.3 11.3*  HGB 14.4 14.7  HCT 41.9 42.5  PLT 195 224   BMET  Recent Labs  04/05/14 1119 04/06/14 0536  NA 136 137  K 3.8 3.9  CL 101 100  CO2 28 30  GLUCOSE 116* 90  BUN 9 6  CREATININE 0.92 0.82  CALCIUM 9.3 8.7    COAG Lab Results  Component Value Date   INR 1.30 04/05/2014   INR 3.0 03/14/2014   INR 2.4 02/12/2014   No results found for: PTT

## 2014-04-08 NOTE — Transfer of Care (Signed)
Immediate Anesthesia Transfer of Care Note  Patient: [name]  Procedure(s) Performed: Procedure(s): LEFT AXILLARY ARTERY TO BRACHIAL ARTERY BYPASS USING NON REVERSE LEFT GREATER SAPHENOUS VEIN ,LIGATION OF LEFT AXILLARY ARTERY ANEURYSM (Left) LEFT GREATER SAPHENOUS VEIN HARVEST (Left)  Patient Location: PACU  Anesthesia Type:General  Level of Consciousness: awake and patient cooperative  Airway & Oxygen Therapy: Patient Spontanous Breathing and Patient connected to nasal cannula oxygen  Post-op Assessment: Report given to RN, Post -op Vital signs reviewed and stable and Patient moving all extremities  Post vital signs: Reviewed and stable  Last Vitals:  Filed Vitals:   04/08/14 1049  BP: 119/85  Pulse:   Temp:   Resp:     Complications: No apparent anesthesia complications

## 2014-04-08 NOTE — Interval H&P Note (Signed)
History and Physical Interval Note:  04/08/2014 11:25 AM  [name]  has presented today for surgery, with the diagnosis of AXILLARY ARTERY ANEURYSM  The various methods of treatment have been discussed with the patient and family. After consideration of risks, benefits and other options for treatment, the patient has consented to  Procedure(s): REPAIR LEFT AXILLARY ARTERY ANEURYSM WITH GREATER SAPHENOUS VEIN  (Left) as a surgical intervention .  The patient's history has been reviewed, patient examined, no change in status, stable for surgery.  I have reviewed the patient's chart and labs.  Questions were answered to the patient's satisfaction.     FIELDS,CHARLES E

## 2014-04-08 NOTE — Anesthesia Preprocedure Evaluation (Addendum)
Anesthesia Evaluation  Patient identified by MRN, date of birth, ID band Patient awake    Reviewed: Allergy & Precautions, NPO status , Patient's Chart, lab work & pertinent test results  Airway Mallampati: II  TM Distance: >3 FB Neck ROM: Full    Dental  (+) Missing, Loose, Chipped, Poor Dentition, Dental Advisory Given Poor dentition:   Pulmonary COPD COPD inhaler, Current Smoker,  breath sounds clear to auscultation        Cardiovascular hypertension, Pt. on medications and Pt. on home beta blockers + dysrhythmias Atrial Fibrillation Rhythm:Irregular     Neuro/Psych Anxiety Depression Left hand numb    GI/Hepatic negative GI ROS, Neg liver ROS,   Endo/Other    Renal/GU      Musculoskeletal   Abdominal (+)  Abdomen: soft.    Peds  Hematology 14/42   04/08/2014   Anesthesia Other Findings   Reproductive/Obstetrics                           Anesthesia Physical  Anesthesia Plan  ASA: III  Anesthesia Plan: General   Post-op Pain Management:    Induction: Intravenous  Airway Management Planned: Oral ETT  Additional Equipment:   Intra-op Plan:   Post-operative Plan: Extubation in OR  Informed Consent: I have reviewed the patients History and Physical, chart, labs and discussed the procedure including the risks, benefits and alternatives for the proposed anesthesia with the patient or authorized representative who has indicated his/her understanding and acceptance.   Dental advisory given  Plan Discussed with: Surgeon and CRNA  Anesthesia Plan Comments:         Anesthesia Quick Evaluation

## 2014-04-08 NOTE — Progress Notes (Signed)
ANTICOAGULATION CONSULT NOTE - Follow Up Consult  Pharmacy Consult for Heparin Indication: DVT  Allergies  Allergen Reactions  . Aspirin Nausea And Vomiting    Patient Measurements: Height: 5\' 10"  (177.8 cm) Weight: 169 lb 5 oz (76.8 kg) IBW/kg (Calculated) : 73  Vital Signs: Temp: 98 F (36.7 C) (02/29 1740) BP: 142/83 mmHg (02/29 1740) Pulse Rate: 61 (02/29 1740)  Labs:  Recent Labs  04/05/14 2351  04/06/14 0536  04/06/14 2333 04/07/14 0444 04/08/14 0408  HGB  --   < > 14.4  --   --  14.7 14.4  HCT  --   --  41.9  --   --  42.5 42.2  PLT  --   --  195  --   --  224 230  LABPROT  --   --   --   --   --   --  14.5  INR  --   --   --   --   --   --  1.11  HEPARINUNFRC  --   --   --   < > 0.32 0.37 0.30  CREATININE  --   --  0.82  --   --   --  0.80  TROPONINI <0.03  --  <0.03  --   --   --   --   < > = values in this interval not displayed.  Estimated Creatinine Clearance: 96.3 mL/min (by C-G formula based on Cr of 0.8).  Assessment: 63yom started on heparin for a new LUE DVT. On 2/26 he underwent a thromboendarterectomy of his left brachial arteries with angioplasty. Today he went for a left axillary to brachial artery bypass/ligation of left axillary artery aneurysm/thrombectomy of left brachial artery. Heparin to resume at 2100 tonight. He was previously therapeutic on 1400 units/hr so will resume at this rate.  Goal of Therapy:  Heparin level 0.3-0.7 units/ml Monitor platelets by anticoagulation protocol: Yes   Plan:  1) At 2100, resume heparin at 1400 units/hr with no bolus 2) Check 6 hour heparin level  Nena Jordan, PharmD, BCPS 04/08/2014 6:20 PM

## 2014-04-08 NOTE — Progress Notes (Signed)
Physical Therapy Treatment Patient Details Name: [name] MRN: [id number] DOB: [birth year] Today's Date: 04/08/2014    History of Present Illness 64 y.o. male presenting with musculoskeletal pain left arm. Patient wit + DVT, now s/p  Left Radial artery cut down and radial artery thrombectomy.     PT Comments    Progressing well towards physical therapy goals. Continues to require min assist for stability with transfers. Ambulating with an assistive device up to 185 feet today. Patient will continue to benefit from skilled physical therapy services to further improve independence with functional mobility.  Follow Up Recommendations  SNF     Equipment Recommendations   (tbd)    Recommendations for Other Services       Precautions / Restrictions Precautions Precautions: Fall Restrictions Weight Bearing Restrictions: No    Mobility  Bed Mobility Overal bed mobility: Modified Independent                Transfers Overall transfer level: Needs assistance Equipment used: 1 person hand held assist   Sit to Stand: Min assist         General transfer comment: Min assist for stability with loss of balance towards Rt. Cues for hand placement.  Ambulation/Gait Ambulation/Gait assistance: Min guard Ambulation Distance (Feet): 185 Feet Assistive device: Rolling walker (2 wheeled) Gait Pattern/deviations: Step-through pattern;Decreased stride length;Drifts right/left;Trunk flexed;Scissoring;Narrow base of support Gait velocity: decreased   General Gait Details: Mild drifting towards his left side with RW. Educated on safe DME use with RW. Cues for upright posture and forward gaze intermittently. No loss of balance requiring physical assist during bout.   Stairs            Wheelchair Mobility    Modified Rankin (Stroke Patients Only)       Balance                                    Cognition Arousal/Alertness: Awake/alert Behavior  During Therapy: WFL for tasks assessed/performed Overall Cognitive Status: Within Functional Limits for tasks assessed                      Exercises General Exercises - Lower Extremity Ankle Circles/Pumps: AROM;Both;10 reps;Seated    General Comments General comments (skin integrity, edema, etc.): LUE edema, erythema      Pertinent Vitals/Pain Pain Assessment: 0-10 Pain Score: 3  Pain Location: LUE Pain Descriptors / Indicators: Aching Pain Intervention(s): Monitored during session    Home Living                      Prior Function            PT Goals (current goals can now be found in the care plan section) Acute Rehab PT Goals Patient Stated Goal: to go home PT Goal Formulation: With patient Time For Goal Achievement: 04/20/14 Potential to Achieve Goals: Fair Progress towards PT goals: Progressing toward goals    Frequency  Min 3X/week    PT Plan Current plan remains appropriate    Co-evaluation             End of Session Equipment Utilized During Treatment: Gait belt Activity Tolerance: Patient tolerated treatment well Patient left: in bed;with call bell/phone within reach     Time: 1008-1021 PT Time Calculation (min) (ACUTE ONLY): 13 min  Charges:  $Gait Training: 8-22 mins  G CodesEllouise Newer May 05, 2014, 10:42 AM Elayne Snare, [place]

## 2014-04-08 NOTE — Op Note (Addendum)
Procedure: Left axillary to brachial artery bypass using reversed left greater saphenous vein, ligation of left axillary artery aneurysm, thrombectomy left brachial artery  Preoperative diagnosis: Left axillary artery aneurysm  Postoperative diagnosis: Same  Anesthesia: Gen.  Assistant: Curt Jews M.D., Silva Bandy PA-C  Operative findings: 4-5 mm left greater saphenous vein end to end to mid left axillary artery and end to end to proximal left brachial artery  Specimens: Thrombus left axillary artery. A segment of left axillary artery for pathology.  Operative details:  After obtaining informed consent, the patient was taken the operating room. The patient was placed in supine position on the operating room table. After induction of general anesthesia and endotracheal intubation, Foley catheter was placed.  Next the patient was prepped and draped in usual sterile fashion down the entire left side of his body including the left upper extremity left lower extremity left chin neck chest and abdominal areas. Next a longitudinal incision was made in the infraclavicular area on the left side. The incision was carried through the subcutaneous tissues and down through the pectoralis muscle. This was reflected inferiorly. The axillary vein was dissected free from the axillary artery. Several side branches of the vein were ligated and divided between silk ties. The axillary artery was dissected free circumferentially and elevated up into the operative field. The artery was 9-10 mm in diameter and ectatic in appearance but non-aneurysmal. The artery was dissected free circumferentially over several centimeters to the area where the brachial plexus began to have several cords traversing across the artery. These were protected as much as possible. Several centimeters of the artery was completely mobilized so that we would have a proper area for proximal grafting. At this point attention was then turned to the left  axilla. A longitudinal incision is made in this location carried down through the subcutaneous anus tissues down to the level of the brachial artery. The adjacent brachial vein was dissected free circumferentially and elevated away from the artery to assist in exposure. Several small side branches of the vein were ligated and divided between silk ties. The artery was dissected free circumferentially and elevated in the operative field. Care was also taken to avoid injuring any nerves in the nearby vicinity. The artery was dissected free high up into the axilla. The distal and of the aneurysm began to flare out at this level which was the distal axillary artery high in the axilla. Dissection up in this area was becoming more tedious and I was concerned that further dissection could possibly injure branches of the brachial plexus. Therefore using gentle blunt dissection and staying right on the artery and a tunnel was created from the axilla up to the infraclavicular space. An umbilical tape was placed through this. Attention was then turned to the patient's left groin. An incision was made in the left groin in longitudinal fashion carried down through the subcutaneous tissues to the level of the left greater saphenous vein. The vein was dissected free circumferentially at the level of the saphenofemoral junction. An additional skip incision was placed in the left medial thigh. The vein was dissected free circumferentially and small side branches ligated and divided between silk ties. The vein was of good quality 4-5 mm diameter. The distal end of the vein was ligated with 2-0 silk tie and the proximal end of the vein oversewn as well and the vein transected proximally and distally. This was gently flushed with heparinized saline and marked for orientation. The patient with him  and given 7000 units of intravenous heparin. The axillary artery was controlled proximally with a Henley clamp. The artery was contained and  controlled distally just above the proximal aspect of the aneurysm with an additional Henley clamp. The artery was transected at this level and the proximal aspect of the aneurysm oversewn with a 5-0 running Prolene suture. The artery was then spatulated proximally; the vein was placed in a reversed configuration and spatulated and sewn end-to-end to the axillary vein using a running 6-0 Prolene suture. At completion anastomosis was tested and found to be hemostatic. There was also good hemostasis from the proximal aspect of the aneurysm were it had been oversewn. The vein was then brought through the tunnel down to the brachial artery. The aneurysm was clamped just below its inferior aspect with a Henley clamp. The brachial artery was clamped several centimeters below this with a fistula clamp. The artery was then transected just below the level of the axillary aneurysm. This was oversewn with a running 5-0 Prolene suture. There was chronic thrombus within the proximal brachial artery. This was removed under direct vision using an endarterectomy elevator. A sample of this was sent to pathology as a specimen. A segment of the axillary artery was also sent to pathology as a specimen. The vein graft was then pulled up to length with the arm fully abducted. The vein was then transected at this length. A #4 Fogarty catheter was also passed down the brachial artery and some thrombus was retrieved. This was passed until 2 clean passes were obtained. There was excellent backbleeding. This was then again controlled with a small fistula clamp. The vein was spatulated as well as the artery and these were then sewn end-to-end using a running 6-0 Prolene suture. Just prior completion anastomosis was forebled backbled and thoroughly flushed. Anastomosis was secured clamps released years pulsatile flow in the artery immediately. The patient also had a palpable radial pulse and brachial pulse in this point. The patient had  biphasic to triphasic ulnar and radial Doppler flow. Next the patient was given 70 mg of protamine to help achieve hemostasis. The left leg incision was closed with running 3-0 Vicryl and 4-0 Vicryl subcuticular stitch followed by Dermabond. The infraclavicular incision was closed with a running 3-0 Vicryl suture in the fascia followed by running 3-0 Vicryl sutures and subcutaneous followed by running 4-0 Vicryl subcuticular stitch and Dermabond. The left axillary incision was closed with a running 3-0 Vicryl suture in the subcutaneous layer followed by 4 Vicryl subcuticular stitch and Dermabond in the skin. The patient tolerated the procedure well and there were no complications. The instrument sponge and needle count was correct in the case. The patient was taken to recovery room in stable condition.  Ruta Hinds, MD [facility] Office: [phone] Pager: [phone]

## 2014-04-08 NOTE — Anesthesia Postprocedure Evaluation (Signed)
  Anesthesia Post-op Note  Patient: [name]  Procedure(s) Performed: Procedure(s): LEFT AXILLARY ARTERY TO BRACHIAL ARTERY BYPASS USING NON REVERSE LEFT GREATER SAPHENOUS VEIN ,LIGATION OF LEFT AXILLARY ARTERY ANEURYSM (Left) LEFT GREATER SAPHENOUS VEIN HARVEST (Left)  Patient Location: PACU  Anesthesia Type: General   Level of Consciousness: awake, alert  and oriented  Airway and Oxygen Therapy: Patient Spontanous Breathing  Post-op Pain: mild  Post-op Assessment: Post-op Vital signs reviewed  Post-op Vital Signs: Reviewed  Last Vitals:  Filed Vitals:   04/08/14 1816  BP: 151/93  Pulse: 69  Temp: 36.9 C  Resp: 11    Complications: No apparent anesthesia complications

## 2014-04-09 ENCOUNTER — Encounter (HOSPITAL_COMMUNITY): Payer: Self-pay | Admitting: Vascular Surgery

## 2014-04-09 LAB — TYPE AND SCREEN
ABO/RH(D): A POS
ANTIBODY SCREEN: NEGATIVE
UNIT DIVISION: 0
Unit division: 0

## 2014-04-09 LAB — CBC
HCT: 37.6 % — ABNORMAL LOW (ref 39.0–52.0)
Hemoglobin: 12.7 g/dL — ABNORMAL LOW (ref 13.0–17.0)
MCH: 30.7 pg (ref 26.0–34.0)
MCHC: 33.8 g/dL (ref 30.0–36.0)
MCV: 90.8 fL (ref 78.0–100.0)
Platelets: 222 10*3/uL (ref 150–400)
RBC: 4.14 MIL/uL — AB (ref 4.22–5.81)
RDW: 13.3 % (ref 11.5–15.5)
WBC: 9.4 10*3/uL (ref 4.0–10.5)

## 2014-04-09 LAB — BASIC METABOLIC PANEL
ANION GAP: 8 (ref 5–15)
BUN: 7 mg/dL (ref 6–23)
CO2: 28 mmol/L (ref 19–32)
Calcium: 8.9 mg/dL (ref 8.4–10.5)
Chloride: 102 mmol/L (ref 96–112)
Creatinine, Ser: 0.78 mg/dL (ref 0.50–1.35)
Glucose, Bld: 107 mg/dL — ABNORMAL HIGH (ref 70–99)
POTASSIUM: 3.8 mmol/L (ref 3.5–5.1)
Sodium: 138 mmol/L (ref 135–145)

## 2014-04-09 LAB — HEPARIN LEVEL (UNFRACTIONATED)
Heparin Unfractionated: 0.18 IU/mL — ABNORMAL LOW (ref 0.30–0.70)
Heparin Unfractionated: 0.26 IU/mL — ABNORMAL LOW (ref 0.30–0.70)

## 2014-04-09 MED ORDER — OXYCODONE-ACETAMINOPHEN 5-325 MG PO TABS: 1.0000 | ORAL_TABLET | ORAL | Status: DC | PRN

## 2014-04-09 MED ORDER — WARFARIN SODIUM 5 MG PO TABS: 7.5000 mg | ORAL_TABLET | Freq: Every day | ORAL | Status: DC

## 2014-04-09 MED ORDER — ENOXAPARIN SODIUM 80 MG/0.8ML ~~LOC~~ SOLN: 70.0000 mg | Freq: Two times a day (BID) | SUBCUTANEOUS | Status: DC

## 2014-04-09 MED ORDER — ENOXAPARIN SODIUM 80 MG/0.8ML ~~LOC~~ SOLN
70.0000 mg | Freq: Two times a day (BID) | SUBCUTANEOUS | Status: DC
  Administered 2014-04-09: 70 mg via SUBCUTANEOUS
  Filled 2014-04-09 (×2): qty 0.8

## 2014-04-09 MED ORDER — REGADENOSON 0.4 MG/5ML IV SOLN
INTRAVENOUS | Status: AC
  Filled 2014-04-09: qty 5

## 2014-04-09 MED ORDER — WARFARIN SODIUM 7.5 MG PO TABS
7.5000 mg | ORAL_TABLET | Freq: Every day | ORAL | Status: DC
  Administered 2014-04-09: 7.5 mg via ORAL
  Filled 2014-04-09: qty 1

## 2014-04-09 MED ORDER — TRAMADOL HCL 50 MG PO TABS: 100.0000 mg | ORAL_TABLET | Freq: Four times a day (QID) | ORAL | Status: DC | PRN

## 2014-04-09 MED ORDER — WARFARIN - PHYSICIAN DOSING INPATIENT
Freq: Every day | Status: DC
  Administered 2014-04-09: 18:00:00

## 2014-04-09 NOTE — Progress Notes (Signed)
Medicare Important Message given? YES (If response is "NO", the following Medicare IM given date fields will be blank) Date Medicare IM given:04/08/2014 Medicare IM given by: Morgen Linebaugh 

## 2014-04-09 NOTE — Progress Notes (Addendum)
  AAA Progress Note    04/09/2014 7:44 AM 1 Day Post-Op  Subjective:  No complaints-would like to go home  Afebrile HR  60's-70's AFib 702'O-378'H systolic 88% RA  Filed Vitals:   04/09/14 0700  BP:   Pulse:   Temp: 98.9 F (37.2 C)  Resp:     Physical Exam: Lungs:  Non labored Incisions:  Left chest, left axillary, left midarm and left wrist incisions are c/d/i with dermabond in place; left leg incision is covered with bandage. Extremities:  Palpable left radial pulse; palpable pedal pulses bilaterally. Sensation and motor are in tact.  CBC    Component Value Date/Time   WBC 9.4 04/09/2014 0247   RBC 4.14* 04/09/2014 0247   HGB 12.7* 04/09/2014 0247   HCT 37.6* 04/09/2014 0247   PLT 222 04/09/2014 0247   MCV 90.8 04/09/2014 0247   MCH 30.7 04/09/2014 0247   MCHC 33.8 04/09/2014 0247   RDW 13.3 04/09/2014 0247   LYMPHSABS 3.3 04/05/2014 1119   MONOABS 1.1* 04/05/2014 1119   EOSABS 0.1 04/05/2014 1119   BASOSABS 0.0 04/05/2014 1119    BMET    Component Value Date/Time   NA 138 04/09/2014 0247   K 3.8 04/09/2014 0247   CL 102 04/09/2014 0247   CO2 28 04/09/2014 0247   GLUCOSE 107* 04/09/2014 0247   BUN 7 04/09/2014 0247   CREATININE 0.78 04/09/2014 0247   CREATININE 0.87 03/14/2014 1338   CALCIUM 8.9 04/09/2014 0247   GFRNONAA >90 04/09/2014 0247   GFRNONAA >89 03/14/2014 1338   GFRAA >90 04/09/2014 0247   GFRAA >89 03/14/2014 1338    INR    Component Value Date/Time   INR 1.11 04/08/2014 0408   INR 3.0 03/14/2014 1218   INR 2.7 04/02/2010 1021     Intake/Output Summary (Last 24 hours) at 04/09/14 0744 Last data filed at 04/09/14 0500  Gross per 24 hour  Intake 4199.44 ml  Output   1433 ml  Net 2766.44 ml     Assessment/Plan:  64 y.o. male is s/p  Left axillary to brachial artery bypass using reversed left greater saphenous vein, ligation of left axillary artery aneurysm, thrombectomy left brachial artery 1 Day Post-Op  -pt doing well  this am with palpable left radial pulse. -foley removed this am-he has now voided -heplock IV  -ambulate pt -pt on heparin gtt-most likely will restart coumadin back today.  Possible Lovenox bridge so pt can d/c home?  Will d/w Dr. Cecile Hearing, PA-C [facility] [phone] 04/09/2014 7:44 AM   2+ radial pulse No hematoma Numbness and tingling in hand continue to improve Motor left hand intact Will see if we can arrange therapeutic dose home lovenox If so will d/c home today.  Resume prior coumadin dose.  Ruta Hinds, MD [facility] Office: [phone] Pager: [phone]

## 2014-04-09 NOTE — Progress Notes (Signed)
Occupational Therapy Treatment Patient Details Name: XZAYVIER FAGIN MRN: [id number] DOB: July 16, 1950 Today's Date: 04/09/2014    History of present illness 64 y.o. male presenting with musculoskeletal pain left arm. Patient wit + DVT, now s/p  Left Radial artery cut down and radial artery thrombectomy.    OT comments  Education provided in session. Pt planning to d/c today.  Follow Up Recommendations  No OT follow up;Supervision - Intermittent    Equipment Recommendations  None recommended by OT    Recommendations for Other Services      Precautions / Restrictions Precautions Precautions: Fall Restrictions Weight Bearing Restrictions: No       Mobility Bed Mobility Overal bed mobility: Needs Assistance Bed Mobility: Supine to Sit     Supine to sit: Supervision        Transfers Overall transfer level: Needs assistance   Transfers: Sit to/from Stand Sit to Stand: Supervision              Balance Supervision for sit to stand transfer from bed                ADL Overall ADL's : Needs assistance/impaired         Upper Body Bathing: Set up;Supervision/ safety;Sitting Upper Body Bathing Details (indicate cue type and reason): cues not to get wound site wet         Lower Body Dressing: Supervision/safety;Sit to/from stand;Set up   Toilet Transfer: Supervision/safety (sit to stand from bed)             General ADL Comments: Educated on dressing technique. Pt donned/doffed socks and donned underwear. OT assisted with gown due to IV, but feel pt could perform UB dressing. Suggested button up shirts or tshirt with large neck. Educated on safety such as sitting for most of LB ADLs and using shower chair. Discussed covering left arm for bathing. Educated on edema management techniques/left UE exercises and explained benefit. OT helped position dressing on left leg when pt donned underwear.      Vision                     Perception      Praxis      Cognition  Awake/Alert Behavior During Therapy: WFL for tasks assessed/performed Overall Cognitive Status: Within Functional Limits for tasks assessed                       Extremity/Trunk Assessment               Exercises Other Exercises Other Exercises: Left AROM elbow flexion/extension, wrist flexion/extension, shoulder flexion-approximately 5 reps each. Instructed to move digits as well.    Shoulder Instructions       General Comments      Pertinent Vitals/ Pain       Pain Assessment: 0-10 Pain Score: 3  Pain Location: left arm Pain Descriptors / Indicators: Aching;Sore Pain Intervention(s): Monitored during session  Home Living                                          Prior Functioning/Environment              Frequency Min 3X/week     Progress Toward Goals  OT Goals(current goals can now be found in the care plan section)  Progress towards OT goals: Progressing toward goals  Acute Rehab OT Goals Patient Stated Goal: not stated OT Goal Formulation: With patient Time For Goal Achievement: 04/20/14 Potential to Achieve Goals: Good ADL Goals Pt Will Perform Upper Body Bathing: with set-up;with supervision;sitting;standing-goal met Pt Will Perform Lower Body Bathing: with set-up;with supervision;sit to/from stand Pt Will Perform Upper Body Dressing: with set-up;with supervision;sitting;standing Pt Will Perform Lower Body Dressing: with set-up;with supervision;sit to/from stand-goal met Pt Will Transfer to Toilet: with supervision;ambulating;regular height toilet;grab bars Pt Will Perform Toileting - Clothing Manipulation and hygiene: with supervision;sit to/from stand Pt/caregiver will Perform Home Exercise Program: Increased ROM;Increased strength;Left upper extremity;With written HEP provided (AROM)    Discharge plan needs to be updated    Co-evaluation                 End of Session     Activity  Tolerance Patient tolerated treatment well   Patient Left in bed;with call bell/phone within reach   Nurse Communication          Time: 4975-3005 OT Time Calculation (min): 15 min  Charges: OT General Charges $OT Visit: 1 Procedure OT Treatments $Self Care/Home Management : 8-22 mins  Benito Mccreedy OTR/L [phone] 04/09/2014, 1:52 PM

## 2014-04-09 NOTE — Progress Notes (Signed)
UR COMPLETED  

## 2014-04-09 NOTE — Progress Notes (Signed)
Received orders to d/c pt home after evening dose of lovenox and coumadin. D/C instructions given to pt. Pt verbalizes understanding of teaching. Pt demonstrated proper administration of lovenox, which he will be administering at home post d/c until follow-up on 04/12/14. No s/s of acute distress noted.

## 2014-04-09 NOTE — Progress Notes (Signed)
ANTICOAGULATION CONSULT NOTE - Follow Up Consult  Pharmacy Consult for heparin Indication: DVT  Labs:  Recent Labs  04/06/14 0536  04/07/14 0444 04/08/14 0408 04/09/14 0247  HGB 14.4  --  14.7 14.4 12.7*  HCT 41.9  --  42.5 42.2 37.6*  PLT 195  --  224 230 222  LABPROT  --   --   --  14.5  --   INR  --   --   --  1.11  --   HEPARINUNFRC  --   < > 0.37 0.30 0.18*  CREATININE 0.82  --   --  0.80 0.78  TROPONINI <0.03  --   --   --   --   < > = values in this interval not displayed.   Assessment: 64yo male subtherapeutic on heparin after resumed post-op; may need more time to accumulate but pt had been at low end of goal at current rate prior to being held.  Goal of Therapy:  Heparin level 0.3-0.7 units/ml   Plan:  Will increase heparin gtt slightly to 1500 units/hr and check level in 6hr.  Wynona Neat, PharmD, BCPS  04/09/2014,4:00 AM

## 2014-04-09 NOTE — Progress Notes (Signed)
Physical Therapy Treatment Patient Details Name: [name] MRN: [id number] DOB: November 28, 1950 Today's Date: 04/09/2014    History of Present Illness 64 y.o. male presenting with musculoskeletal pain left arm. Patient wit + DVT, now s/p  Left Radial artery cut down and radial artery thrombectomy.     PT Comments    Pt moving quite well today and didn't require any A from PT.  Discussed use of a RW at home as needed and do not feel pt will need SNF at D/C.  Will continue to follow while on acute.    Follow Up Recommendations  Home health PT;Supervision - Intermittent     Equipment Recommendations  Rolling walker with 5" wheels    Recommendations for Other Services       Precautions / Restrictions Precautions Precautions: None Restrictions Weight Bearing Restrictions: No    Mobility  Bed Mobility Overal bed mobility: Modified Independent                Transfers Overall transfer level: Modified independent                  Ambulation/Gait Ambulation/Gait assistance: Modified independent (Device/Increase time) Ambulation Distance (Feet): 200 Feet Assistive device:  (IV pole) Gait Pattern/deviations: Step-through pattern;Decreased stride length     General Gait Details: pt moving well today with single UE support on IV pole.  Discussed use of RW at home.     Stairs            Wheelchair Mobility    Modified Rankin (Stroke Patients Only)       Balance Overall balance assessment: Needs assistance         Standing balance support: Single extremity supported;No upper extremity supported Standing balance-Leahy Scale: Fair Standing balance comment: pt utilizes single UE support during balance challenges.                      Cognition Arousal/Alertness: Awake/alert Behavior During Therapy: WFL for tasks assessed/performed Overall Cognitive Status: Within Functional Limits for tasks assessed                       Exercises      General Comments        Pertinent Vitals/Pain Pain Assessment: 0-10 Pain Score: 2  Pain Location: L UE Pain Descriptors / Indicators: Tightness Pain Intervention(s): Monitored during session;Premedicated before session;Repositioned    Home Living                      Prior Function            PT Goals (current goals can now be found in the care plan section) Acute Rehab PT Goals Patient Stated Goal: to go home PT Goal Formulation: With patient Time For Goal Achievement: 04/20/14 Potential to Achieve Goals: Good Progress towards PT goals: Progressing toward goals    Frequency  Min 3X/week    PT Plan Discharge plan needs to be updated    Co-evaluation             End of Session   Activity Tolerance: Patient tolerated treatment well Patient left: in bed;with call bell/phone within reach     Time: 9629-5284 PT Time Calculation (min) (ACUTE ONLY): 12 min  Charges:  $Gait Training: 8-22 mins                    G Codes:      [name],  [name], [name] 04/09/2014, 10:09 AM

## 2014-04-09 NOTE — Clinical Social Work Note (Signed)
Spoke with patient at bedside concerning SNF placement- patient would prefer not to go to SNF but is willing to re-evaluate after surgery.  Full assessment to follow.  CSW will continue to follow.  Domenica Reamer, [place] Social Worker [phone]

## 2014-04-10 ENCOUNTER — Telehealth: Payer: Self-pay

## 2014-04-10 NOTE — Telephone Encounter (Signed)
Received call from Corliss Blacker, PT in regards to nursing consult for medication management and PT/OT. This was authorized based on OT eval done prior to discharge, no discharge note in epic at this time. She was made aware that we do not manage coumadin and would have to consult PCP or cardiologist for any concerns.

## 2014-04-11 ENCOUNTER — Telehealth: Payer: Self-pay | Admitting: Vascular Surgery

## 2014-04-11 NOTE — Telephone Encounter (Signed)
Left message for pt re appt, dpm  ° °

## 2014-04-11 NOTE — Telephone Encounter (Signed)
-----   Message from Mena Goes, RN sent at 04/09/2014 12:23 PM EST ----- Regarding: Schedule   ----- Message -----    From: Alvia Grove, PA-C    Sent: 04/09/2014  12:04 PM      To: [facility] Charge Pool  S/p Left axillary to brachial artery bypass using reversed left greater saphenous vein, ligation of left axillary artery aneurysm, thrombectomy left brachial artery 04/08/14  F/u with Dr. Oneida Alar in 2 weeks.  Thanks Maudie Mercury

## 2014-04-11 NOTE — Discharge Summary (Signed)
[facility] Discharge Summary  [name] [birth year] 64 y.o. male  [id number]  Admission Date: 04/05/2014  Discharge Date: 04/09/2014  Physician: Ruta Hinds, MD  Admission Diagnosis: Arterial occlusion [I74.9] Atrial fibrillation, unspecified [I48.91]   HPI:   This is a 64 y.o. male who presented to the [facility] ED on 04/05/14 with left shoulder, elbow and hand pain that has gotten progressively worse over the last 2 days. He has known A fib and ran out of his coumadin. He was off of it for 3 days and restarted it 2 days ago. He has movement and sensation is grossly intact. Pulseless left brachial and radial arteries. No ulcers noted. Past medical history includes: tobacco abuse, hypertension, and A fib.  He takes a statin and beta blocker daily.   Hospital Course:  The patient was admitted to the hospital and taken to the operating room on 04/05/2014 and underwent thromboendartectomy of the left brachial, radial and ulnar arteries with vein angioplasty and right femoral artery cannulation arch aortogram second-order catheterization left subclavian artery. The patient tolerated the procedure well and was transported to the PACU in stable condition. In the recovery room, the patient stated that his hand was feeling better. He had brachial but no radial flow.  A CTA was ordered for the following day.   On POD 1, the patient felt that his hand was improving. He was resumed on a heparin drip. 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 Surgery for aneurysm repair was discussed with the patient.   On POD 2, the patient had arm/hand ischemia was improved. He had no palpable radial or ulnar pulses. Lower extremity vein mapping was ordered to look for a vein conduit.   On 04/08/2014, he was taken to the OR and underwent left axillary to brachial artery bypass using reversed left greater saphenous vein, ligation of left  axillary artery aneurysm, thrombectomy left brachial artery. He tolerated the procedure well and was taken to the recovery room in stable condition.   On POD 1, he had no complaints. He had a palpable left radial pulse and palpable pedal pulses bilaterally. His sensation and motor function were intact. His incisions were all healing well. His heparin was discontinued and started on a lovenox bridge to coumadin. He was instructed on use of lovenox at home in addition to his coumadin. An appointment was made to follow up on his INR. His pain was adequately controlled and he was discharged home in good condition.   The remainder of the hospital course consisted of increasing mobilization and increasing intake of solids without difficulty.  CBC    Component Value Date/Time   WBC 9.4 04/09/2014 0247   RBC 4.14* 04/09/2014 0247   HGB 12.7* 04/09/2014 0247   HCT 37.6* 04/09/2014 0247   PLT 222 04/09/2014 0247   MCV 90.8 04/09/2014 0247   MCH 30.7 04/09/2014 0247   MCHC 33.8 04/09/2014 0247   RDW 13.3 04/09/2014 0247   LYMPHSABS 3.3 04/05/2014 1119   MONOABS 1.1* 04/05/2014 1119   EOSABS 0.1 04/05/2014 1119   BASOSABS 0.0 04/05/2014 1119    BMET    Component Value Date/Time   NA 138 04/09/2014 0247   K 3.8 04/09/2014 0247   CL 102 04/09/2014 0247   CO2 28 04/09/2014 0247   GLUCOSE 107* 04/09/2014 0247   BUN 7 04/09/2014 0247   CREATININE 0.78 04/09/2014 0247   CREATININE 0.87 03/14/2014 1338   CALCIUM 8.9  04/09/2014 0247   GFRNONAA >90 04/09/2014 0247   GFRNONAA >89 03/14/2014 1338   GFRAA >90 04/09/2014 0247   GFRAA >89 03/14/2014 1338     Discharge Instructions:   The patient is discharged to home with extensive instructions on wound care and progressive ambulation.  They are instructed not to drive or perform any heavy lifting until returning to see the physician in his office.  Discharge Instructions    Call MD for:  redness, tenderness, or signs of infection (pain,  swelling, bleeding, redness, odor or green/yellow discharge around incision site)    Complete by:  As directed      Call MD for:  severe or increased pain, loss or decreased feeling  in affected limb(s)    Complete by:  As directed      Call MD for:  temperature >100.5    Complete by:  As directed      Discharge wound care:    Complete by:  As directed   Wash arm wounds and left leg thigh wounds daily with soap and water and pat dry. You do not have to apply a dressing.     Driving Restrictions    Complete by:  As directed   No driving for one week and while on pain medication.     Lifting restrictions    Complete by:  As directed   No lifting for 1 week.     Resume previous diet    Complete by:  As directed            Discharge Diagnosis:  Arterial occlusion [I74.9] Atrial fibrillation, unspecified [I48.91]  Secondary Diagnosis: Patient Active Problem List   Diagnosis Date Noted  . Ischemia of hand 04/05/2014  . Special screening for malignant neoplasms, colon 03/20/2014  . Warfarin-induced coagulopathy 03/20/2014  . Encounter for therapeutic drug monitoring 03/08/2013  . Headache 01/29/2013  . Prostate nodule with urinary obstruction 12/13/2012  . Routine general medical examination at a health care facility 12/13/2012  . Depression with anxiety 12/07/2012  . Long term (current) use of anticoagulants 04/30/2010  . HYPERCHOLESTEROLEMIA-PURE 04/22/2008  . TOBACCO ABUSE 04/22/2008  . HYPERTENSION, BENIGN ESSENTIAL 04/22/2008  . Atrial fibrillation 04/22/2008  . COPD 04/22/2008   Past Medical History  Diagnosis Date  . Atrial fibrillation   . Hypertension   . Hyperlipidemia   . COPD (chronic obstructive pulmonary disease)   . Amaurosis fugax of left eye     history of   . Hx of echocardiogram     Echo (07/09/13):  Mild LVH, EF 55-60%, MAC, mild MR, severe LAE, mod RAE       Medication List    STOP taking these medications        traMADol 50 MG tablet    Commonly known as:  ULTRAM      TAKE these medications        acetaminophen 500 MG tablet  Commonly known as:  TYLENOL  Take 1,000 mg by mouth 2 (two) times daily.     albuterol 108 (90 BASE) MCG/ACT inhaler  Commonly known as:  PROVENTIL HFA;VENTOLIN HFA  Inhale 2 puffs into the lungs every 6 (six) hours as needed for wheezing or shortness of breath.     clonazePAM 1 MG tablet  Commonly known as:  KLONOPIN  Take 1 tablet (1 mg total) by mouth 3 (three) times daily.     diltiazem 240 MG 24 hr capsule  Commonly known as:  DILACOR XR  Take  1 capsule (240 mg total) by mouth daily.     enoxaparin 80 MG/0.8ML injection  Commonly known as:  LOVENOX  Inject 0.7 mLs (70 mg total) into the skin every 12 (twelve) hours. Please inject 70 mg total every 12 hours at 6:00 pm and 6:00 am until you go to the coumadin clinic on 04/12/14.     gabapentin 100 MG capsule  Commonly known as:  NEURONTIN  Take 1 capsule (100 mg total) by mouth at bedtime.     metoprolol 100 MG tablet  Commonly known as:  LOPRESSOR  Take 1 tablet (100 mg total) by mouth 2 (two) times daily.     oxyCODONE-acetaminophen 5-325 MG per tablet  Commonly known as:  PERCOCET/ROXICET  Take 1-2 tablets by mouth every 4 (four) hours as needed for moderate pain.     pravastatin 40 MG tablet  Commonly known as:  PRAVACHOL  Take 1 tablet (40 mg total) by mouth daily.     sertraline 50 MG tablet  Commonly known as:  ZOLOFT  Take 1 tablet (50 mg total) by mouth at bedtime.     warfarin 5 MG tablet  Commonly known as:  COUMADIN  Take 1.5 tablets (7.5 mg total) by mouth daily at 6 PM. Please take 1.5 tablets at 6:00 pm daily for next three days until seen at coumadin clinic on Friday 04/12/14.       Percocet #30 No Refill  Disposition: Home  Patient's condition: is Good  Follow up: 1. Dr. Oneida Alar in 2 weeks   Virgina Jock, PA-C [facility] [phone] 04/11/2014  12:51 PM

## 2014-04-17 ENCOUNTER — Ambulatory Visit (INDEPENDENT_AMBULATORY_CARE_PROVIDER_SITE_OTHER): Payer: Commercial Managed Care - HMO | Admitting: *Deleted

## 2014-04-17 DIAGNOSIS — I4891 Unspecified atrial fibrillation: Secondary | ICD-10-CM

## 2014-04-17 DIAGNOSIS — Z5181 Encounter for therapeutic drug level monitoring: Secondary | ICD-10-CM

## 2014-04-17 LAB — POCT INR: INR: 2.3

## 2014-04-23 ENCOUNTER — Encounter: Payer: Self-pay | Admitting: Vascular Surgery

## 2014-04-24 ENCOUNTER — Ambulatory Visit (INDEPENDENT_AMBULATORY_CARE_PROVIDER_SITE_OTHER): Payer: Self-pay | Admitting: Vascular Surgery

## 2014-04-24 ENCOUNTER — Ambulatory Visit (INDEPENDENT_AMBULATORY_CARE_PROVIDER_SITE_OTHER): Payer: Commercial Managed Care - HMO | Admitting: *Deleted

## 2014-04-24 ENCOUNTER — Telehealth: Payer: Self-pay

## 2014-04-24 ENCOUNTER — Encounter: Payer: Self-pay | Admitting: Vascular Surgery

## 2014-04-24 VITALS — BP 124/83 | HR 72 | Resp 16 | Ht 70.0 in | Wt 170.0 lb

## 2014-04-24 DIAGNOSIS — Z5181 Encounter for therapeutic drug level monitoring: Secondary | ICD-10-CM

## 2014-04-24 DIAGNOSIS — Z7901 Long term (current) use of anticoagulants: Secondary | ICD-10-CM

## 2014-04-24 DIAGNOSIS — I4891 Unspecified atrial fibrillation: Secondary | ICD-10-CM

## 2014-04-24 DIAGNOSIS — I721 Aneurysm of artery of upper extremity: Secondary | ICD-10-CM

## 2014-04-24 DIAGNOSIS — Z48812 Encounter for surgical aftercare following surgery on the circulatory system: Secondary | ICD-10-CM

## 2014-04-24 LAB — PROTIME-INR
INR: 6.3 ratio (ref 0.8–1.0)
PROTHROMBIN TIME: 66.2 s — AB (ref 9.6–13.1)

## 2014-04-24 LAB — POCT INR: INR: 6.1

## 2014-04-24 NOTE — Progress Notes (Signed)
Patient is a 64 year old male who returns for follow-up today after recent left axillary to brachial artery bypass. This was done using a reversed greater saphenous from his left leg. He states he still has some numbness and tingling in his left hand. He states overall his hand function and arm function is improved. He still has some soreness around his shoulder and posterior neck. His hospital workup included an ultrasound of his aorta which showed his aortic diameter to be 3.3 cm. He had no chest or popliteal aneurysm. He did have a nodule on his chest CT scan which will need further follow-up. He unfortunately continues to smoke but is trying to cut back. He denies any incisional drainage. He has no fever or chills.  Physical exam:  Filed Vitals:   04/24/14 0914  BP: 124/83  Pulse: 72  Resp: 16  Height: 5\' 10"  (1.778 m)  Weight: 170 lb (77.111 kg)    Left upper extremity: Well-healed left infraclavicular incision, well-healed left axillary and brachial incision 2+ left radial pulse, motor strength left hand significantly improved but still 4 over 5 Left lower extremity: Healing left saphenectomy site   Assessment:  Overall improved status post ligation of left axillary aneurysm with left axillary to brachial bypass  Plan: Patient needs follow-up CT scan of the chest with contrast for follow-up of his chest nodule in 3 months. We will also visualized his bypass graft at that point. He will see me again in 3 months.  He will need a follow-up ultrasound of his abdominal aorta in one year.  Ruta Hinds, MD [facility] [place] Office: [phone] Pager: [phone]

## 2014-04-24 NOTE — Telephone Encounter (Signed)
Critical result of INR 6.3 taken to Coumadin clinic, they are going to contact pt. To setup plan.

## 2014-04-24 NOTE — Addendum Note (Signed)
Addended by: Dorthula Rue L on: 04/24/2014 11:38 AM   Modules accepted: Orders

## 2014-04-29 ENCOUNTER — Ambulatory Visit (INDEPENDENT_AMBULATORY_CARE_PROVIDER_SITE_OTHER): Payer: Commercial Managed Care - HMO | Admitting: *Deleted

## 2014-04-29 DIAGNOSIS — Z5181 Encounter for therapeutic drug level monitoring: Secondary | ICD-10-CM

## 2014-04-29 DIAGNOSIS — I4891 Unspecified atrial fibrillation: Secondary | ICD-10-CM

## 2014-04-29 DIAGNOSIS — Z7901 Long term (current) use of anticoagulants: Secondary | ICD-10-CM

## 2014-04-29 LAB — POCT INR: INR: 1.7

## 2014-05-06 ENCOUNTER — Ambulatory Visit (INDEPENDENT_AMBULATORY_CARE_PROVIDER_SITE_OTHER): Payer: Commercial Managed Care - HMO

## 2014-05-06 DIAGNOSIS — Z7901 Long term (current) use of anticoagulants: Secondary | ICD-10-CM | POA: Diagnosis not present

## 2014-05-06 DIAGNOSIS — Z5181 Encounter for therapeutic drug level monitoring: Secondary | ICD-10-CM

## 2014-05-06 DIAGNOSIS — I4891 Unspecified atrial fibrillation: Secondary | ICD-10-CM | POA: Diagnosis not present

## 2014-05-06 LAB — POCT INR: INR: 3.1

## 2014-05-20 ENCOUNTER — Ambulatory Visit (INDEPENDENT_AMBULATORY_CARE_PROVIDER_SITE_OTHER): Payer: Commercial Managed Care - HMO | Admitting: *Deleted

## 2014-05-20 DIAGNOSIS — I4891 Unspecified atrial fibrillation: Secondary | ICD-10-CM

## 2014-05-20 DIAGNOSIS — Z5181 Encounter for therapeutic drug level monitoring: Secondary | ICD-10-CM

## 2014-05-20 DIAGNOSIS — Z7901 Long term (current) use of anticoagulants: Secondary | ICD-10-CM

## 2014-05-20 LAB — POCT INR: INR: 3.9

## 2014-06-03 ENCOUNTER — Ambulatory Visit (INDEPENDENT_AMBULATORY_CARE_PROVIDER_SITE_OTHER): Payer: Commercial Managed Care - HMO | Admitting: *Deleted

## 2014-06-03 DIAGNOSIS — Z5181 Encounter for therapeutic drug level monitoring: Secondary | ICD-10-CM

## 2014-06-03 DIAGNOSIS — I4891 Unspecified atrial fibrillation: Secondary | ICD-10-CM | POA: Diagnosis not present

## 2014-06-03 DIAGNOSIS — Z7901 Long term (current) use of anticoagulants: Secondary | ICD-10-CM | POA: Diagnosis not present

## 2014-06-03 LAB — POCT INR: INR: 3.3

## 2014-06-17 ENCOUNTER — Ambulatory Visit (INDEPENDENT_AMBULATORY_CARE_PROVIDER_SITE_OTHER): Payer: Commercial Managed Care - HMO | Admitting: *Deleted

## 2014-06-17 ENCOUNTER — Telehealth: Payer: Self-pay | Admitting: *Deleted

## 2014-06-17 ENCOUNTER — Other Ambulatory Visit: Payer: Self-pay | Admitting: *Deleted

## 2014-06-17 DIAGNOSIS — Z5181 Encounter for therapeutic drug level monitoring: Secondary | ICD-10-CM | POA: Diagnosis not present

## 2014-06-17 DIAGNOSIS — I4891 Unspecified atrial fibrillation: Secondary | ICD-10-CM | POA: Diagnosis not present

## 2014-06-17 DIAGNOSIS — Z7901 Long term (current) use of anticoagulants: Secondary | ICD-10-CM | POA: Diagnosis not present

## 2014-06-17 LAB — POCT INR: INR: 3

## 2014-06-17 MED ORDER — DILTIAZEM HCL ER 240 MG PO CP24: 240.0000 mg | ORAL_CAPSULE | Freq: Every day | ORAL | Status: DC

## 2014-06-17 NOTE — Telephone Encounter (Signed)
While in the CV[facility] clinic patient stated he needed a few pills of Diltiazem until his meds were sent from [facility]. Left message to inform him that the refill department has sent in 10 pills of Diltiazem to [facility] off [address] as requested.

## 2014-06-17 NOTE — Telephone Encounter (Signed)
While in the CVRR clinic patient stated he needed a few pills until his meds were sent from [facility].  Left message to inform him that the refill department has sent in 10 pills to [facility] off [address] as requested.

## 2014-06-26 ENCOUNTER — Encounter: Payer: Self-pay | Admitting: Internal Medicine

## 2014-07-09 ENCOUNTER — Ambulatory Visit (INDEPENDENT_AMBULATORY_CARE_PROVIDER_SITE_OTHER): Payer: Commercial Managed Care - HMO

## 2014-07-09 DIAGNOSIS — Z7901 Long term (current) use of anticoagulants: Secondary | ICD-10-CM

## 2014-07-09 DIAGNOSIS — Z5181 Encounter for therapeutic drug level monitoring: Secondary | ICD-10-CM | POA: Diagnosis not present

## 2014-07-09 DIAGNOSIS — I4891 Unspecified atrial fibrillation: Secondary | ICD-10-CM | POA: Diagnosis not present

## 2014-07-09 LAB — POCT INR: INR: 2.4

## 2014-08-06 ENCOUNTER — Ambulatory Visit (INDEPENDENT_AMBULATORY_CARE_PROVIDER_SITE_OTHER): Payer: Commercial Managed Care - HMO | Admitting: *Deleted

## 2014-08-06 DIAGNOSIS — I4891 Unspecified atrial fibrillation: Secondary | ICD-10-CM

## 2014-08-06 DIAGNOSIS — Z5181 Encounter for therapeutic drug level monitoring: Secondary | ICD-10-CM

## 2014-08-06 DIAGNOSIS — Z7901 Long term (current) use of anticoagulants: Secondary | ICD-10-CM

## 2014-08-06 LAB — POCT INR: INR: 2.4

## 2014-08-07 ENCOUNTER — Other Ambulatory Visit: Payer: Self-pay | Admitting: *Deleted

## 2014-08-07 ENCOUNTER — Encounter: Payer: Self-pay | Admitting: Vascular Surgery

## 2014-08-07 DIAGNOSIS — Z01812 Encounter for preprocedural laboratory examination: Secondary | ICD-10-CM

## 2014-08-08 ENCOUNTER — Encounter: Payer: Self-pay | Admitting: Vascular Surgery

## 2014-08-08 ENCOUNTER — Ambulatory Visit (INDEPENDENT_AMBULATORY_CARE_PROVIDER_SITE_OTHER): Payer: Commercial Managed Care - HMO | Admitting: Vascular Surgery

## 2014-08-08 ENCOUNTER — Ambulatory Visit
Admission: RE | Admit: 2014-08-08 | Discharge: 2014-08-08 | Disposition: A | Discharge status: Home / Self Care | Payer: Commercial Managed Care - HMO | Source: Ambulatory Visit | Attending: Vascular Surgery | Admitting: Vascular Surgery

## 2014-08-08 VITALS — BP 160/117 | HR 75 | Resp 16 | Ht 70.0 in | Wt 170.0 lb

## 2014-08-08 DIAGNOSIS — I771 Stricture of artery: Secondary | ICD-10-CM

## 2014-08-08 DIAGNOSIS — I739 Peripheral vascular disease, unspecified: Secondary | ICD-10-CM | POA: Diagnosis not present

## 2014-08-08 DIAGNOSIS — Z48812 Encounter for surgical aftercare following surgery on the circulatory system: Secondary | ICD-10-CM

## 2014-08-08 DIAGNOSIS — I721 Aneurysm of artery of upper extremity: Secondary | ICD-10-CM

## 2014-08-08 LAB — BUN: BUN: 14 mg/dL (ref 6–23)

## 2014-08-08 LAB — CREATININE, SERUM: CREATININE: 1.23 mg/dL (ref 0.50–1.35)

## 2014-08-08 MED ORDER — IOPAMIDOL (ISOVUE-370) INJECTION 76%
75.0000 mL | Freq: Once | INTRAVENOUS | Status: AC | PRN
  Administered 2014-08-08: 75 mL via INTRAVENOUS

## 2014-08-08 NOTE — Progress Notes (Signed)
Filed Vitals:   08/08/14 1549 08/08/14 1552  BP: 164/111 160/117  Pulse: 72 75  Resp: 16   Height: 5\' 10"  (1.778 m)   Weight: 170 lb (77.111 kg)

## 2014-08-08 NOTE — Progress Notes (Signed)
POST OPERATIVE OFFICE NOTE    CC:  F/u for surgery  HPI:  This is a 64 y.o. male who is s/p left axillary to brachial artery bypass 04/08/2014.  He is doing well without symptoms of numbness or weakness in the left upper extremity.  He has increased pain and slight edema at the antecubital incision on/off if he uses it for heavy type lifting.  He reports no changes in medications or his health since his last visit.   He continues to smoke daily, but is trying to cut down and working towards quitting.    Allergies  Allergen Reactions  . Aspirin Nausea And Vomiting    Current Outpatient Prescriptions  Medication Sig Dispense Refill  . acetaminophen (TYLENOL) 500 MG tablet Take 1,000 mg by mouth 2 (two) times daily.    Marland Kitchen albuterol (PROVENTIL HFA;VENTOLIN HFA) 108 (90 BASE) MCG/ACT inhaler Inhale 2 puffs into the lungs every 6 (six) hours as needed for wheezing or shortness of breath.    . clonazePAM (KLONOPIN) 1 MG tablet Take 1 tablet (1 mg total) by mouth 3 (three) times daily. 75 tablet 2  . diltiazem (DILACOR XR) 240 MG 24 hr capsule Take 1 capsule (240 mg total) by mouth daily. 10 capsule 0  . gabapentin (NEURONTIN) 100 MG capsule Take 1 capsule (100 mg total) by mouth at bedtime.    . metoprolol (LOPRESSOR) 100 MG tablet Take 1 tablet (100 mg total) by mouth 2 (two) times daily. 180 tablet 1  . oxyCODONE-acetaminophen (PERCOCET/ROXICET) 5-325 MG per tablet Take 1-2 tablets by mouth every 4 (four) hours as needed for moderate pain. 30 tablet 0  . pravastatin (PRAVACHOL) 40 MG tablet Take 1 tablet (40 mg total) by mouth daily. 30 tablet 12  . sertraline (ZOLOFT) 50 MG tablet Take 1 tablet (50 mg total) by mouth at bedtime.    Marland Kitchen warfarin (COUMADIN) 5 MG tablet Take 1.5 tablets (7.5 mg total) by mouth daily at 6 PM. Please take 1.5 tablets at 6:00 pm daily for next three days until seen at coumadin clinic on Friday 04/12/14. 40 tablet 3  . enoxaparin (LOVENOX) 80 MG/0.8ML injection Inject 0.7  mLs (70 mg total) into the skin every 12 (twelve) hours. Please inject 70 mg total every 12 hours at 6:00 pm and 6:00 am until you go to the coumadin clinic on 04/12/14. (Patient not taking: Reported on 08/08/2014) 14 Syringe 1   No current facility-administered medications for this visit.     ROS:  See HPI  Physical Exam:  Filed Vitals:   08/08/14 1552  BP: 160/117  Pulse: 75  Resp:     Incision:  Well healed, no erythema or open wounds. Extremities:  Neuro/motor intact, palpable radial and brachial pulses 2+.  Grip 5/5 and sensation grossly intact. Heart RRR Lungs CTA B  Data CTA chest: IMPRESSION: 1. Since prior CT, the left axillary artery aneurysm has been repaired. There is no residual patent aneurysm and no significant axillary artery stenosis. 2. Small pulmonary nodules. Nodule in the right upper lobe appears larger than on the prior study and a 4 mm nodule in the left upper lobe appears new. This apparent difference may be due to differences in imaging technique, the prior study somewhat degraded by motion, and slice selection. Recommend additional follow-up exam, in 6 months, to reassess nodule stability. 3. No acute findings.  Assessment/Plan:  This is a 64 y.o. male who is s/p:ligation of left axillary aneurysm with left axillary to brachial  bypass  He doesn't complain of symptoms of ischemia.  If he has numbness or weakness he will call, otherwise we will see him back in 6 month for a left UE duplex.  We will ask Dr. Virgina Jock to f/u on the chest CTA the radiologist recommend a f/u CTA in 6 months.      Theda Sers, [place] MAUREEN PA-C [facility]   Clinic MD:  Pt seen and examined with Dr. Oneida Alar  History and exam findings as above. Patient recently underwent left axillary to brachial artery bypass with saphenous vein. All of his incisions are well-healed. His graft is patent. He will need long-term follow-up for chronic lung nodules. We will defer  this to Dr. Virgina Jock. The patient will return for follow-up for a left upper extremity duplex exam with [place] in 6 months time.  Ruta Hinds, MD [facility] [place] Office: [phone] Pager: [phone]

## 2014-09-03 ENCOUNTER — Ambulatory Visit (INDEPENDENT_AMBULATORY_CARE_PROVIDER_SITE_OTHER): Payer: Commercial Managed Care - HMO | Admitting: *Deleted

## 2014-09-03 DIAGNOSIS — Z7901 Long term (current) use of anticoagulants: Secondary | ICD-10-CM | POA: Diagnosis not present

## 2014-09-03 DIAGNOSIS — Z5181 Encounter for therapeutic drug level monitoring: Secondary | ICD-10-CM

## 2014-09-03 DIAGNOSIS — I4891 Unspecified atrial fibrillation: Secondary | ICD-10-CM

## 2014-09-03 LAB — POCT INR: INR: 2

## 2014-09-11 DIAGNOSIS — H10413 Chronic giant papillary conjunctivitis, bilateral: Secondary | ICD-10-CM | POA: Diagnosis not present

## 2014-09-11 DIAGNOSIS — H2513 Age-related nuclear cataract, bilateral: Secondary | ICD-10-CM | POA: Diagnosis not present

## 2014-09-11 DIAGNOSIS — H5703 Miosis: Secondary | ICD-10-CM | POA: Diagnosis not present

## 2014-09-26 ENCOUNTER — Telehealth: Payer: Self-pay | Admitting: Cardiology

## 2014-09-26 MED ORDER — WARFARIN SODIUM 5 MG PO TABS: ORAL_TABLET | ORAL | Status: DC

## 2014-09-26 MED ORDER — ATORVASTATIN CALCIUM 40 MG PO TABS: 40.0000 mg | ORAL_TABLET | Freq: Every day | ORAL | Status: DC

## 2014-09-26 MED ORDER — DILTIAZEM HCL ER 240 MG PO CP24: 240.0000 mg | ORAL_CAPSULE | Freq: Every day | ORAL | Status: DC

## 2014-09-26 NOTE — Telephone Encounter (Signed)
Please call,needs to get his medicine straight.

## 2014-09-26 NOTE — Telephone Encounter (Signed)
Spoke with pt, refills sent to the pharmacy. 

## 2014-10-15 ENCOUNTER — Ambulatory Visit (INDEPENDENT_AMBULATORY_CARE_PROVIDER_SITE_OTHER): Payer: Medicare Other | Admitting: Pharmacist

## 2014-10-15 DIAGNOSIS — Z5181 Encounter for therapeutic drug level monitoring: Secondary | ICD-10-CM | POA: Diagnosis not present

## 2014-10-15 DIAGNOSIS — Z7901 Long term (current) use of anticoagulants: Secondary | ICD-10-CM

## 2014-10-15 DIAGNOSIS — I4891 Unspecified atrial fibrillation: Secondary | ICD-10-CM

## 2014-10-15 LAB — POCT INR: INR: 2.1

## 2014-10-18 ENCOUNTER — Telehealth: Payer: Self-pay | Admitting: Cardiology

## 2014-10-18 ENCOUNTER — Other Ambulatory Visit: Payer: Self-pay | Admitting: *Deleted

## 2014-10-18 MED ORDER — METOPROLOL TARTRATE 100 MG PO TABS: 100.0000 mg | ORAL_TABLET | Freq: Two times a day (BID) | ORAL | Status: DC

## 2014-10-18 NOTE — Telephone Encounter (Signed)
°  1. Which medications need to be refilled?Metoprolol ° °2. Which pharmacy is medication to be sent to?[facility] ° °3. Do they need a 30 day or 90 day supply? #180 and refills ° °4. Would they like a call back once the medication has been sent to the pharmacy? no ° °

## 2014-11-20 ENCOUNTER — Encounter (HOSPITAL_COMMUNITY): Payer: Self-pay | Admitting: Family Medicine

## 2014-11-20 ENCOUNTER — Encounter (HOSPITAL_COMMUNITY)
Admission: EM | Disposition: A | Discharge status: Home Health | Payer: Self-pay | Source: Home / Self Care | Attending: Vascular Surgery

## 2014-11-20 ENCOUNTER — Emergency Department (HOSPITAL_COMMUNITY): Payer: Medicare Other

## 2014-11-20 ENCOUNTER — Other Ambulatory Visit (HOSPITAL_COMMUNITY): Payer: Medicare Other

## 2014-11-20 ENCOUNTER — Emergency Department (HOSPITAL_COMMUNITY): Payer: Medicare Other | Admitting: Certified Registered Nurse Anesthetist

## 2014-11-20 ENCOUNTER — Inpatient Hospital Stay (HOSPITAL_COMMUNITY)
Admission: EM | Admit: 2014-11-20 | Discharge: 2014-11-26 | DRG: 252 | Disposition: A | Discharge status: Home Health | Payer: Medicare Other | Attending: Vascular Surgery | Admitting: Vascular Surgery

## 2014-11-20 DIAGNOSIS — Z886 Allergy status to analgesic agent status: Secondary | ICD-10-CM

## 2014-11-20 DIAGNOSIS — Z79899 Other long term (current) drug therapy: Secondary | ICD-10-CM

## 2014-11-20 DIAGNOSIS — I749 Embolism and thrombosis of unspecified artery: Secondary | ICD-10-CM | POA: Diagnosis not present

## 2014-11-20 DIAGNOSIS — I7090 Unspecified atherosclerosis: Secondary | ICD-10-CM

## 2014-11-20 DIAGNOSIS — Z8673 Personal history of transient ischemic attack (TIA), and cerebral infarction without residual deficits: Secondary | ICD-10-CM | POA: Diagnosis not present

## 2014-11-20 DIAGNOSIS — Z7901 Long term (current) use of anticoagulants: Secondary | ICD-10-CM | POA: Diagnosis not present

## 2014-11-20 DIAGNOSIS — H539 Unspecified visual disturbance: Secondary | ICD-10-CM

## 2014-11-20 DIAGNOSIS — E785 Hyperlipidemia, unspecified: Secondary | ICD-10-CM | POA: Diagnosis present

## 2014-11-20 DIAGNOSIS — T82868A Thrombosis of vascular prosthetic devices, implants and grafts, initial encounter: Secondary | ICD-10-CM | POA: Diagnosis not present

## 2014-11-20 DIAGNOSIS — I10 Essential (primary) hypertension: Secondary | ICD-10-CM | POA: Diagnosis present

## 2014-11-20 DIAGNOSIS — M7989 Other specified soft tissue disorders: Secondary | ICD-10-CM | POA: Diagnosis not present

## 2014-11-20 DIAGNOSIS — I998 Other disorder of circulatory system: Secondary | ICD-10-CM | POA: Diagnosis not present

## 2014-11-20 DIAGNOSIS — I743 Embolism and thrombosis of arteries of the lower extremities: Secondary | ICD-10-CM | POA: Diagnosis not present

## 2014-11-20 DIAGNOSIS — I6789 Other cerebrovascular disease: Secondary | ICD-10-CM | POA: Diagnosis not present

## 2014-11-20 DIAGNOSIS — I639 Cerebral infarction, unspecified: Secondary | ICD-10-CM

## 2014-11-20 DIAGNOSIS — I4891 Unspecified atrial fibrillation: Secondary | ICD-10-CM | POA: Diagnosis present

## 2014-11-20 DIAGNOSIS — I63443 Cerebral infarction due to embolism of bilateral cerebellar arteries: Secondary | ICD-10-CM | POA: Diagnosis not present

## 2014-11-20 DIAGNOSIS — J449 Chronic obstructive pulmonary disease, unspecified: Secondary | ICD-10-CM | POA: Diagnosis present

## 2014-11-20 DIAGNOSIS — I739 Peripheral vascular disease, unspecified: Secondary | ICD-10-CM | POA: Diagnosis present

## 2014-11-20 DIAGNOSIS — G453 Amaurosis fugax: Secondary | ICD-10-CM | POA: Diagnosis present

## 2014-11-20 DIAGNOSIS — H53451 Other localized visual field defect, right eye: Secondary | ICD-10-CM | POA: Diagnosis not present

## 2014-11-20 DIAGNOSIS — F17210 Nicotine dependence, cigarettes, uncomplicated: Secondary | ICD-10-CM | POA: Diagnosis present

## 2014-11-20 DIAGNOSIS — M79602 Pain in left arm: Secondary | ICD-10-CM

## 2014-11-20 DIAGNOSIS — I63433 Cerebral infarction due to embolism of bilateral posterior cerebral arteries: Secondary | ICD-10-CM | POA: Diagnosis not present

## 2014-11-20 DIAGNOSIS — T82398A Other mechanical complication of other vascular grafts, initial encounter: Secondary | ICD-10-CM | POA: Diagnosis not present

## 2014-11-20 DIAGNOSIS — I742 Embolism and thrombosis of arteries of the upper extremities: Secondary | ICD-10-CM | POA: Diagnosis not present

## 2014-11-20 HISTORY — PX: THROMBECTOMY BRACHIAL ARTERY: SHX6649

## 2014-11-20 LAB — CBC WITH DIFFERENTIAL/PLATELET
BASOS ABS: 0 10*3/uL (ref 0.0–0.1)
BASOS PCT: 0 %
EOS PCT: 1 %
Eosinophils Absolute: 0.1 10*3/uL (ref 0.0–0.7)
HCT: 45.3 % (ref 39.0–52.0)
Hemoglobin: 15.3 g/dL (ref 13.0–17.0)
Lymphocytes Relative: 25 %
Lymphs Abs: 2.7 10*3/uL (ref 0.7–4.0)
MCH: 31.5 pg (ref 26.0–34.0)
MCHC: 33.8 g/dL (ref 30.0–36.0)
MCV: 93.2 fL (ref 78.0–100.0)
MONO ABS: 0.8 10*3/uL (ref 0.1–1.0)
Monocytes Relative: 8 %
NEUTROS ABS: 7.1 10*3/uL (ref 1.7–7.7)
Neutrophils Relative %: 66 %
PLATELETS: 246 10*3/uL (ref 150–400)
RBC: 4.86 MIL/uL (ref 4.22–5.81)
RDW: 13 % (ref 11.5–15.5)
WBC: 10.7 10*3/uL — AB (ref 4.0–10.5)

## 2014-11-20 LAB — BASIC METABOLIC PANEL
ANION GAP: 10 (ref 5–15)
BUN: 10 mg/dL (ref 6–20)
CALCIUM: 9.3 mg/dL (ref 8.9–10.3)
CO2: 24 mmol/L (ref 22–32)
Chloride: 102 mmol/L (ref 101–111)
Creatinine, Ser: 0.9 mg/dL (ref 0.61–1.24)
Glucose, Bld: 105 mg/dL — ABNORMAL HIGH (ref 65–99)
POTASSIUM: 3.6 mmol/L (ref 3.5–5.1)
SODIUM: 136 mmol/L (ref 135–145)

## 2014-11-20 LAB — PROTIME-INR
INR: 1.49 (ref 0.00–1.49)
PROTHROMBIN TIME: 18.1 s — AB (ref 11.6–15.2)

## 2014-11-20 LAB — TYPE AND SCREEN
ABO/RH(D): A POS
Antibody Screen: NEGATIVE

## 2014-11-20 SURGERY — THROMBECTOMY, ARTERY, BRACHIAL
Anesthesia: General | Site: Arm Upper | Laterality: Left

## 2014-11-20 MED ORDER — HYDROMORPHONE HCL 1 MG/ML IJ SOLN
0.2500 mg | INTRAMUSCULAR | Status: DC | PRN
  Administered 2014-11-20 (×2): 0.5 mg via INTRAVENOUS

## 2014-11-20 MED ORDER — HEMOSTATIC AGENTS (NO CHARGE) OPTIME
TOPICAL | Status: DC | PRN
  Administered 2014-11-20: 1 via TOPICAL

## 2014-11-20 MED ORDER — SODIUM CHLORIDE 0.9 % IV SOLN
INTRAVENOUS | Status: DC | PRN
  Administered 2014-11-20: 16:00:00 via INTRAVENOUS

## 2014-11-20 MED ORDER — ONDANSETRON HCL 4 MG/2ML IJ SOLN: 4.0000 mg | Freq: Four times a day (QID) | INTRAMUSCULAR | Status: DC | PRN

## 2014-11-20 MED ORDER — LACTATED RINGERS IV SOLN
INTRAVENOUS | Status: DC | PRN
  Administered 2014-11-20 (×2): via INTRAVENOUS

## 2014-11-20 MED ORDER — POTASSIUM CHLORIDE CRYS ER 20 MEQ PO TBCR
20.0000 meq | EXTENDED_RELEASE_TABLET | Freq: Every day | ORAL | Status: DC | PRN
Start: 2014-11-20 — End: 2014-11-26

## 2014-11-20 MED ORDER — CEFAZOLIN SODIUM-DEXTROSE 2-3 GM-% IV SOLR
INTRAVENOUS | Status: DC | PRN
  Administered 2014-11-20 (×2): 2 g via INTRAVENOUS

## 2014-11-20 MED ORDER — IOHEXOL 350 MG/ML SOLN
100.0000 mL | Freq: Once | INTRAVENOUS | Status: AC | PRN
  Administered 2014-11-20: 100 mL via INTRAVENOUS

## 2014-11-20 MED ORDER — ALBUTEROL SULFATE HFA 108 (90 BASE) MCG/ACT IN AERS
INHALATION_SPRAY | RESPIRATORY_TRACT | Status: DC | PRN
  Administered 2014-11-20: 4 via RESPIRATORY_TRACT

## 2014-11-20 MED ORDER — LIDOCAINE HCL (CARDIAC) 20 MG/ML IV SOLN
INTRAVENOUS | Status: DC | PRN
  Administered 2014-11-20: 80 mg via INTRAVENOUS

## 2014-11-20 MED ORDER — ONDANSETRON HCL 4 MG/2ML IJ SOLN
INTRAMUSCULAR | Status: AC
  Filled 2014-11-20: qty 2

## 2014-11-20 MED ORDER — ATORVASTATIN CALCIUM 40 MG PO TABS
40.0000 mg | ORAL_TABLET | Freq: Every day | ORAL | Status: DC
  Administered 2014-11-21 – 2014-11-25 (×5): 40 mg via ORAL
  Filled 2014-11-20 (×5): qty 1

## 2014-11-20 MED ORDER — ALBUTEROL SULFATE (2.5 MG/3ML) 0.083% IN NEBU: 3.0000 mL | INHALATION_SOLUTION | Freq: Four times a day (QID) | RESPIRATORY_TRACT | Status: DC | PRN

## 2014-11-20 MED ORDER — SUCCINYLCHOLINE CHLORIDE 20 MG/ML IJ SOLN
INTRAMUSCULAR | Status: AC
  Filled 2014-11-20: qty 1

## 2014-11-20 MED ORDER — PHENYLEPHRINE HCL 10 MG/ML IJ SOLN
10.0000 mg | INTRAVENOUS | Status: DC | PRN
  Administered 2014-11-20: 20:00:00 via INTRAVENOUS
  Administered 2014-11-20: 20 ug/min via INTRAVENOUS

## 2014-11-20 MED ORDER — ACETAMINOPHEN 325 MG PO TABS
325.0000 mg | ORAL_TABLET | ORAL | Status: DC | PRN
  Filled 2014-11-20: qty 2

## 2014-11-20 MED ORDER — HEPARIN SODIUM (PORCINE) 1000 UNIT/ML IJ SOLN
INTRAMUSCULAR | Status: DC | PRN
  Administered 2014-11-20 (×2): 2000 [IU] via INTRAVENOUS
  Administered 2014-11-20: 6000 [IU] via INTRAVENOUS

## 2014-11-20 MED ORDER — MORPHINE SULFATE (PF) 2 MG/ML IV SOLN
2.0000 mg | INTRAVENOUS | Status: DC | PRN
  Administered 2014-11-21 – 2014-11-25 (×4): 2 mg via INTRAVENOUS
  Filled 2014-11-20 (×6): qty 1

## 2014-11-20 MED ORDER — DOCUSATE SODIUM 100 MG PO CAPS
100.0000 mg | ORAL_CAPSULE | Freq: Every day | ORAL | Status: DC
  Administered 2014-11-21 – 2014-11-25 (×5): 100 mg via ORAL
  Filled 2014-11-20 (×5): qty 1

## 2014-11-20 MED ORDER — PROTAMINE SULFATE 10 MG/ML IV SOLN
INTRAVENOUS | Status: DC | PRN
  Administered 2014-11-20 (×6): 10 mg via INTRAVENOUS
  Administered 2014-11-20: 20 mg via INTRAVENOUS
  Administered 2014-11-20 (×2): 10 mg via INTRAVENOUS

## 2014-11-20 MED ORDER — HEPARIN (PORCINE) IN NACL 100-0.45 UNIT/ML-% IJ SOLN
1300.0000 [IU]/h | INTRAMUSCULAR | Status: DC
Start: 2014-11-20 — End: 2014-11-26
  Administered 2014-11-20: 500 [IU]/h via INTRAVENOUS
  Administered 2014-11-22: 1150 [IU]/h via INTRAVENOUS
  Administered 2014-11-23 – 2014-11-26 (×4): 1300 [IU]/h via INTRAVENOUS
  Filled 2014-11-20 (×6): qty 250

## 2014-11-20 MED ORDER — ALUM & MAG HYDROXIDE-SIMETH 200-200-20 MG/5ML PO SUSP
15.0000 mL | ORAL | Status: DC | PRN
  Administered 2014-11-25: 30 mL via ORAL
  Filled 2014-11-20: qty 30

## 2014-11-20 MED ORDER — LABETALOL HCL 5 MG/ML IV SOLN: 10.0000 mg | INTRAVENOUS | Status: DC | PRN

## 2014-11-20 MED ORDER — PHENOL 1.4 % MT LIQD: 1.0000 | OROMUCOSAL | Status: DC | PRN

## 2014-11-20 MED ORDER — METOPROLOL TARTRATE 1 MG/ML IV SOLN: 2.0000 mg | INTRAVENOUS | Status: DC | PRN

## 2014-11-20 MED ORDER — LIDOCAINE HCL (CARDIAC) 20 MG/ML IV SOLN
INTRAVENOUS | Status: AC
  Filled 2014-11-20: qty 5

## 2014-11-20 MED ORDER — OXYCODONE HCL 5 MG/5ML PO SOLN: 5.0000 mg | Freq: Once | ORAL | Status: DC | PRN

## 2014-11-20 MED ORDER — DEXTROSE 5 % IV SOLN
1.5000 g | Freq: Two times a day (BID) | INTRAVENOUS | Status: AC
  Administered 2014-11-21 (×2): 1.5 g via INTRAVENOUS
  Filled 2014-11-20 (×2): qty 1.5

## 2014-11-20 MED ORDER — MAGNESIUM HYDROXIDE 400 MG/5ML PO SUSP: 30.0000 mL | Freq: Every day | ORAL | Status: DC | PRN

## 2014-11-20 MED ORDER — MIDAZOLAM HCL 2 MG/2ML IJ SOLN
INTRAMUSCULAR | Status: AC
  Filled 2014-11-20: qty 4

## 2014-11-20 MED ORDER — PROTAMINE SULFATE 10 MG/ML IV SOLN
INTRAVENOUS | Status: AC
  Filled 2014-11-20: qty 5

## 2014-11-20 MED ORDER — PROPOFOL 10 MG/ML IV BOLUS
INTRAVENOUS | Status: DC | PRN
  Administered 2014-11-20: 50 mg via INTRAVENOUS
  Administered 2014-11-20: 120 mg via INTRAVENOUS
  Administered 2014-11-20 (×2): 50 mg via INTRAVENOUS
  Administered 2014-11-20: 20 mg via INTRAVENOUS
  Administered 2014-11-20: 180 mg via INTRAVENOUS

## 2014-11-20 MED ORDER — HEPARIN (PORCINE) IN NACL 100-0.45 UNIT/ML-% IJ SOLN
1400.0000 [IU]/h | INTRAMUSCULAR | Status: DC
  Filled 2014-11-20: qty 250

## 2014-11-20 MED ORDER — PROPOFOL 10 MG/ML IV BOLUS
INTRAVENOUS | Status: AC
  Filled 2014-11-20: qty 20

## 2014-11-20 MED ORDER — IODIXANOL 320 MG/ML IV SOLN
INTRAVENOUS | Status: DC | PRN
  Administered 2014-11-20: 48 mL via INTRAVENOUS

## 2014-11-20 MED ORDER — GUAIFENESIN-DM 100-10 MG/5ML PO SYRP: 15.0000 mL | ORAL_SOLUTION | ORAL | Status: DC | PRN

## 2014-11-20 MED ORDER — SODIUM CHLORIDE 0.9 % IV SOLN: 500.0000 mL | Freq: Once | INTRAVENOUS | Status: DC | PRN

## 2014-11-20 MED ORDER — ONDANSETRON HCL 4 MG/2ML IJ SOLN
INTRAMUSCULAR | Status: DC | PRN
  Administered 2014-11-20: 4 mg via INTRAVENOUS

## 2014-11-20 MED ORDER — SERTRALINE HCL 50 MG PO TABS
50.0000 mg | ORAL_TABLET | Freq: Every day | ORAL | Status: DC
  Administered 2014-11-21 – 2014-11-25 (×6): 50 mg via ORAL
  Filled 2014-11-20 (×7): qty 1

## 2014-11-20 MED ORDER — OXYCODONE-ACETAMINOPHEN 5-325 MG PO TABS
1.0000 | ORAL_TABLET | ORAL | Status: DC | PRN
  Administered 2014-11-21: 2 via ORAL
  Administered 2014-11-21: 1 via ORAL
  Administered 2014-11-21 – 2014-11-22 (×5): 2 via ORAL
  Administered 2014-11-23: 1 via ORAL
  Administered 2014-11-23: 2 via ORAL
  Administered 2014-11-23: 1 via ORAL
  Administered 2014-11-23 – 2014-11-26 (×10): 2 via ORAL
  Filled 2014-11-20 (×8): qty 2
  Filled 2014-11-20 (×2): qty 1
  Filled 2014-11-20 (×10): qty 2
  Filled 2014-11-20: qty 1

## 2014-11-20 MED ORDER — HEPARIN BOLUS VIA INFUSION
2500.0000 [IU] | Freq: Once | INTRAVENOUS | Status: DC
  Filled 2014-11-20: qty 2500

## 2014-11-20 MED ORDER — 0.9 % SODIUM CHLORIDE (POUR BTL) OPTIME
TOPICAL | Status: DC | PRN
  Administered 2014-11-20: 2000 mL

## 2014-11-20 MED ORDER — FENTANYL CITRATE (PF) 250 MCG/5ML IJ SOLN
INTRAMUSCULAR | Status: AC
  Filled 2014-11-20: qty 5

## 2014-11-20 MED ORDER — ROCURONIUM BROMIDE 100 MG/10ML IV SOLN
INTRAVENOUS | Status: DC | PRN
  Administered 2014-11-20: 30 mg via INTRAVENOUS

## 2014-11-20 MED ORDER — MIDAZOLAM HCL 5 MG/5ML IJ SOLN
INTRAMUSCULAR | Status: DC | PRN
  Administered 2014-11-20: 0.5 mg via INTRAVENOUS
  Administered 2014-11-20: 1 mg via INTRAVENOUS
  Administered 2014-11-20: 0.5 mg via INTRAVENOUS

## 2014-11-20 MED ORDER — OXYCODONE HCL 5 MG PO TABS: 5.0000 mg | ORAL_TABLET | Freq: Once | ORAL | Status: DC | PRN

## 2014-11-20 MED ORDER — HYDRALAZINE HCL 20 MG/ML IJ SOLN: 5.0000 mg | INTRAMUSCULAR | Status: DC | PRN

## 2014-11-20 MED ORDER — ACETAMINOPHEN 160 MG/5ML PO SOLN
325.0000 mg | ORAL | Status: DC | PRN
  Filled 2014-11-20: qty 20.3

## 2014-11-20 MED ORDER — HEPARIN SODIUM (PORCINE) 1000 UNIT/ML IJ SOLN
INTRAMUSCULAR | Status: AC
  Filled 2014-11-20: qty 1

## 2014-11-20 MED ORDER — CEFAZOLIN SODIUM-DEXTROSE 2-3 GM-% IV SOLR
INTRAVENOUS | Status: AC
  Filled 2014-11-20: qty 50

## 2014-11-20 MED ORDER — DEXTROSE 5 % IV SOLN
INTRAVENOUS | Status: DC | PRN
  Administered 2014-11-20: 17:00:00 via INTRAVENOUS

## 2014-11-20 MED ORDER — METOPROLOL TARTRATE 100 MG PO TABS
100.0000 mg | ORAL_TABLET | Freq: Two times a day (BID) | ORAL | Status: DC
  Administered 2014-11-21 – 2014-11-25 (×11): 100 mg via ORAL
  Filled 2014-11-20 (×11): qty 1

## 2014-11-20 MED ORDER — PANTOPRAZOLE SODIUM 40 MG PO TBEC
40.0000 mg | DELAYED_RELEASE_TABLET | Freq: Every day | ORAL | Status: DC
  Administered 2014-11-21 – 2014-11-25 (×5): 40 mg via ORAL
  Filled 2014-11-20 (×5): qty 1

## 2014-11-20 MED ORDER — FENTANYL CITRATE (PF) 100 MCG/2ML IJ SOLN
INTRAMUSCULAR | Status: DC | PRN
  Administered 2014-11-20: 100 ug via INTRAVENOUS
  Administered 2014-11-20: 25 ug via INTRAVENOUS
  Administered 2014-11-20 (×3): 50 ug via INTRAVENOUS
  Administered 2014-11-20: 25 ug via INTRAVENOUS
  Administered 2014-11-20: 50 ug via INTRAVENOUS

## 2014-11-20 MED ORDER — ACETAMINOPHEN 500 MG PO TABS
1000.0000 mg | ORAL_TABLET | Freq: Two times a day (BID) | ORAL | Status: DC
  Administered 2014-11-21 (×2): 1000 mg via ORAL
  Filled 2014-11-20 (×4): qty 2

## 2014-11-20 MED ORDER — SODIUM CHLORIDE 0.9 % IV SOLN
INTRAVENOUS | Status: DC
  Administered 2014-11-21: 03:00:00 via INTRAVENOUS

## 2014-11-20 MED ORDER — SUCCINYLCHOLINE CHLORIDE 20 MG/ML IJ SOLN
INTRAMUSCULAR | Status: DC | PRN
  Administered 2014-11-20: 110 mg via INTRAVENOUS

## 2014-11-20 MED ORDER — HYDROMORPHONE HCL 1 MG/ML IJ SOLN
INTRAMUSCULAR | Status: AC
  Filled 2014-11-20: qty 1

## 2014-11-20 MED ORDER — ROCURONIUM BROMIDE 50 MG/5ML IV SOLN
INTRAVENOUS | Status: AC
  Filled 2014-11-20: qty 1

## 2014-11-20 MED ORDER — DILTIAZEM HCL ER COATED BEADS 240 MG PO CP24
240.0000 mg | ORAL_CAPSULE | Freq: Every day | ORAL | Status: DC
  Administered 2014-11-21 – 2014-11-25 (×5): 240 mg via ORAL
  Filled 2014-11-20 (×9): qty 1

## 2014-11-20 MED ORDER — SODIUM CHLORIDE 0.9 % IV SOLN
INTRAVENOUS | Status: DC | PRN
  Administered 2014-11-20: 500 mL

## 2014-11-20 MED ORDER — BISACODYL 10 MG RE SUPP: 10.0000 mg | Freq: Every day | RECTAL | Status: DC | PRN

## 2014-11-20 SURGICAL SUPPLY — 57 items
BAG BANDED W/RUBBER/TAPE 36X54 (MISCELLANEOUS) ×3 IMPLANT
BAG SNAP BAND KOVER 36X36 (MISCELLANEOUS) ×3 IMPLANT
BANDAGE ELASTIC 4 VELCRO ST LF (GAUZE/BANDAGES/DRESSINGS) ×3 IMPLANT
BLADE SURG 15 STRL LF DISP TIS (BLADE) ×2 IMPLANT
BLADE SURG 15 STRL SS (BLADE) ×4
BNDG GAUZE ELAST 4 BULKY (GAUZE/BANDAGES/DRESSINGS) ×3 IMPLANT
CANISTER SUCTION 2500CC (MISCELLANEOUS) ×3 IMPLANT
CATH EMB 3FR 80CM (CATHETERS) ×3 IMPLANT
CATH EMB 4FR 80CM (CATHETERS) ×3 IMPLANT
CATH EMB 5FR 80CM (CATHETERS) ×3 IMPLANT
CLIP TI MEDIUM 24 (CLIP) ×3 IMPLANT
CLIP TI WIDE RED SMALL 24 (CLIP) ×3 IMPLANT
DRAIN SNY 10X20 3/4 PERF (WOUND CARE) IMPLANT
DRAPE INCISE IOBAN 66X45 STRL (DRAPES) ×3 IMPLANT
ELECT REM PT RETURN 9FT ADLT (ELECTROSURGICAL) ×3
ELECTRODE REM PT RTRN 9FT ADLT (ELECTROSURGICAL) ×1 IMPLANT
EVACUATOR SILICONE 100CC (DRAIN) IMPLANT
GAUZE SPONGE 4X4 12PLY STRL (GAUZE/BANDAGES/DRESSINGS) IMPLANT
GLOVE BIO SURGEON STRL SZ 6.5 (GLOVE) ×8 IMPLANT
GLOVE BIO SURGEONS STRL SZ 6.5 (GLOVE) ×4
GLOVE BIOGEL PI IND STRL 6.5 (GLOVE) ×3 IMPLANT
GLOVE BIOGEL PI INDICATOR 6.5 (GLOVE) ×6
GLOVE SS BIOGEL STRL SZ 7 (GLOVE) ×2 IMPLANT
GLOVE SUPERSENSE BIOGEL SZ 7 (GLOVE) ×4
GOWN STRL REUS W/ TWL LRG LVL3 (GOWN DISPOSABLE) ×4 IMPLANT
GOWN STRL REUS W/TWL LRG LVL3 (GOWN DISPOSABLE) ×8
GRAFT GORETEX STRT 4-7X45 (Vascular Products) IMPLANT
HEMOSTAT SNOW SURGICEL 2X4 (HEMOSTASIS) ×3 IMPLANT
INSERT FOGARTY SM (MISCELLANEOUS) ×3 IMPLANT
KIT BASIN OR (CUSTOM PROCEDURE TRAY) ×3 IMPLANT
KIT ROOM TURNOVER OR (KITS) ×3 IMPLANT
LIQUID BAND (GAUZE/BANDAGES/DRESSINGS) ×3 IMPLANT
NS IRRIG 1000ML POUR BTL (IV SOLUTION) ×6 IMPLANT
PACK PERIPHERAL VASCULAR (CUSTOM PROCEDURE TRAY) ×3 IMPLANT
PAD ARMBOARD 7.5X6 YLW CONV (MISCELLANEOUS) ×6 IMPLANT
PATCH HEMASHIELD 8X150 (Vascular Products) ×3 IMPLANT
SET COLLECT BLD 21X3/4 12 PB (MISCELLANEOUS) ×3 IMPLANT
SPONGE GAUZE 4X4 12PLY STER LF (GAUZE/BANDAGES/DRESSINGS) ×3 IMPLANT
SPONGE INTESTINAL PEANUT (DISPOSABLE) ×3 IMPLANT
SPONGE LAP 18X18 X RAY DECT (DISPOSABLE) ×3 IMPLANT
STOPCOCK 4 WAY LG BORE MALE ST (IV SETS) ×3 IMPLANT
SUT PROLENE 5 0 C 1 24 (SUTURE) ×3 IMPLANT
SUT PROLENE 5 0 C1 (SUTURE) IMPLANT
SUT PROLENE 6 0 BV (SUTURE) ×9 IMPLANT
SUT PROLENE 6 0 C 1 24 (SUTURE) ×12 IMPLANT
SUT PROLENE 6 0 CC (SUTURE) ×9 IMPLANT
SUT SILK 2 0 SH (SUTURE) ×3 IMPLANT
SUT VIC AB 2-0 CTX 36 (SUTURE) ×3 IMPLANT
SUT VIC AB 3-0 SH 27 (SUTURE) ×6
SUT VIC AB 3-0 SH 27X BRD (SUTURE) ×3 IMPLANT
SUT VICRYL 4-0 PS2 18IN ABS (SUTURE) ×3 IMPLANT
SYR 3ML LL SCALE MARK (SYRINGE) ×3 IMPLANT
SYRINGE 1CC SLIP TB (MISCELLANEOUS) ×3 IMPLANT
SYRINGE 3CC LL L/F (MISCELLANEOUS) ×3 IMPLANT
TRAY FOLEY W/METER SILVER 16FR (SET/KITS/TRAYS/PACK) ×3 IMPLANT
TUBING EXTENTION W/L.L. (IV SETS) ×3 IMPLANT
WATER STERILE IRR 1000ML POUR (IV SOLUTION) ×3 IMPLANT

## 2014-11-20 NOTE — Transfer of Care (Signed)
Immediate Anesthesia Transfer of Care Note  Patient: [name]  Procedure(s) Performed: Procedure(s): 1.  Thrombectomy Left Axilo-Brachial Bypass  2.  Thromboendarterecotmy of Left Brachial Artery with Fogarty Thrombectomy of Radial and Ulnar Arteries with Dacron patch angioplasty Left Brachial Artery. 3. Intraoperative  Arteriogram times four. (Left)  Patient Location: PACU  Anesthesia Type:General  Level of Consciousness: awake, alert  and patient cooperative  Airway & Oxygen Therapy: Patient Spontanous Breathing and Patient connected to nasal cannula oxygen  Post-op Assessment: Report given to RN, Post -op Vital signs reviewed and stable and Patient moving all extremities  Post vital signs: Reviewed and stable  Last Vitals:  Filed Vitals:   11/20/14 2117  BP: 156/101  Pulse: 74  Temp: 37 C  Resp: 12    Complications: No apparent anesthesia complications

## 2014-11-20 NOTE — Progress Notes (Signed)
ANTICOAGULATION CONSULT NOTE - Initial Consult  Pharmacy Consult for heparin Indication: DVT  Allergies  Allergen Reactions  . Aspirin Nausea And Vomiting    Patient Measurements: Height: 5' 10.08" (178 cm) (08/08/2014) Weight: 176 lb 5.9 oz (80 kg) (pt reported) IBW/kg (Calculated) : 73.18 Heparin Dosing Weight: 80 kg  Vital Signs: Temp: 98.1 F (36.7 C) (10/12 1322) BP: 137/95 mmHg (10/12 1430) Pulse Rate: 72 (10/12 1430)  Labs:  Recent Labs  11/20/14 1417  HGB 15.3  HCT 45.3  PLT 246  LABPROT 18.1*  INR 1.49  CREATININE 0.90    Estimated Creatinine Clearance: 84.7 mL/min (by C-G formula based on Cr of 0.9).   Medical History: Past Medical History  Diagnosis Date  . Atrial fibrillation ([place])   . Hypertension   . Hyperlipidemia   . COPD (chronic obstructive pulmonary disease) ([place])   . Amaurosis fugax of left eye     history of   . Hx of echocardiogram     Echo (07/09/13):  Mild LVH, EF 55-60%, MAC, mild MR, severe LAE, mod RAE    Medications:   (Not in a hospital admission)  Assessment: 12 yoM presenting with left arm pain since Sunday. Arm is discolored w/ no radial pulse. Pt was on warfarin PTA for afib and hx DVT. Unknown PTA dose.  Per nurse report, pt is to go to OR for clot removal. Will follow-up with pt after OR.   Hgb 15.3, Plt 246, INR 1.49   Goal of Therapy:  Heparin level 0.3-0.7 units/ml Monitor platelets by anticoagulation protocol: Yes   Plan:  -Give 2500 units bolus x 1 (50% bolus given INR) -Start heparin infusion at 1400 units/hr -Check anti-Xa level in 6 hours and daily while on heparin -Continue to monitor H&H and platelets w/ daily CBC  -Per nurse, heparin gtt will not be started prior to pt going to OR. Will need to f/u w/ pt following OR visit and determine if heparin is still indicated. If so, 6hr level will need to be scheduled.  Governor Specking, PharmD Clinical Pharmacy Resident Pager: [phone] 11/20/2014,3:21  PM

## 2014-11-20 NOTE — Anesthesia Preprocedure Evaluation (Addendum)
Anesthesia Evaluation  Patient identified by MRN, date of birth, ID band Patient awake    Reviewed: Allergy & Precautions, NPO status , Patient's Chart, lab work & pertinent test results  Airway Mallampati: II  TM Distance: >3 FB Neck ROM: Full    Dental  (+) Dental Advisory Given, Poor Dentition   Pulmonary COPD,  COPD inhaler, Current Smoker,    breath sounds clear to auscultation       Cardiovascular hypertension, Pt. on medications and Pt. on home beta blockers + dysrhythmias Atrial Fibrillation  Rhythm:Regular Rate:Normal     Neuro/Psych    GI/Hepatic negative GI ROS, Neg liver ROS,   Endo/Other  negative endocrine ROS  Renal/GU Renal InsufficiencyRenal disease     Musculoskeletal   Abdominal   Peds  Hematology   Anesthesia Other Findings Many teeth missing; others broken at gum; lower right front loose.  Reproductive/Obstetrics                           Anesthesia Physical Anesthesia Plan  ASA: III and emergent  Anesthesia Plan: General   Post-op Pain Management:    Induction: Intravenous  Airway Management Planned: Oral ETT  Additional Equipment:   Intra-op Plan:   Post-operative Plan: Extubation in OR  Informed Consent: I have reviewed the patients History and Physical, chart, labs and discussed the procedure including the risks, benefits and alternatives for the proposed anesthesia with the patient or authorized representative who has indicated his/her understanding and acceptance.   Dental advisory given  Plan Discussed with: CRNA, Anesthesiologist and Surgeon  Anesthesia Plan Comments:         Anesthesia Quick Evaluation

## 2014-11-20 NOTE — ED Notes (Signed)
Vascular MD to bedside.

## 2014-11-20 NOTE — ED Provider Notes (Signed)
CSN: [id number]     Arrival date & time 11/20/14  1310 History   First MD Initiated Contact with Patient 11/20/14 1402     Chief Complaint  Patient presents with  . Arm Pain     (Consider location/radiation/quality/duration/timing/severity/associated sxs/prior Treatment) Patient is a 64 y.o. male presenting with arm pain. The history is provided by the patient.  Arm Pain This is a recurrent problem. The current episode started 2 days ago. The problem occurs constantly. The problem has not changed since onset.Pertinent negatives include no chest pain, no abdominal pain, no headaches and no shortness of breath. Nothing aggravates the symptoms. Nothing relieves the symptoms. He has tried nothing for the symptoms. The treatment provided no relief.   64 yo M with a chief complaint of left arm pain. Patient has a history of clots that arm that was repaired with a bypass surgery. Patient for the past 3 or 4 days having pain numbness and decreased movement and that arm. Worsening today. Patient denies recurrent injury.  Past Medical History  Diagnosis Date  . Atrial fibrillation ([place])   . Hypertension   . Hyperlipidemia   . COPD (chronic obstructive pulmonary disease) ([place])   . Amaurosis fugax of left eye     history of   . Hx of echocardiogram     Echo (07/09/13):  Mild LVH, EF 55-60%, MAC, mild MR, severe LAE, mod RAE  . Renal insufficiency    Past Surgical History  Procedure Laterality Date  . Embolectomy Left 04/05/2014    Procedure: THROMBO ENDARTERECTOMY OF LEFT BRACHIAL, RADIAL AND ULNAR ARTERY,  Left Radial artery cut down and radial artery thrombectomy.;  Surgeon: Elam Dutch, MD;  Location: [facility] OR;  Service: Vascular;  Laterality: Left;  . Patch angioplasty Left 04/05/2014    Procedure: LEFT ARM VEIN PATCH ANGIOPLASTY;  Surgeon: Elam Dutch, MD;  Location: [facility];  Service: Vascular;  Laterality: Left;  . Arch aortogram N/A 04/05/2014    Procedure: ARCH AORTOGRAM, FIRST ORDER  CATHETERIZATION LEFT SUBCLAVIAN ARTERY;  Surgeon: Elam Dutch, MD;  Location: [facility];  Service: Vascular;  Laterality: N/A;  . Axillary-femoral bypass graft Left 04/08/2014    Procedure: LEFT AXILLARY ARTERY TO BRACHIAL ARTERY BYPASS USING NON REVERSE LEFT GREATER SAPHENOUS VEIN ,LIGATION OF LEFT AXILLARY ARTERY ANEURYSM;  Surgeon: Elam Dutch, MD;  Location: [facility];  Service: Vascular;  Laterality: Left;  . Vein harvest Left 04/08/2014    Procedure: LEFT GREATER SAPHENOUS VEIN HARVEST;  Surgeon: Elam Dutch, MD;  Location: [facility] OR;  Service: Vascular;  Laterality: Left;   Family History  Problem Relation Age of Onset  . Hypertension Mother   . Cancer Mother   . Cancer Father     gastric cancer  . Cancer Sister   . Early death Neg Hx   . Heart disease Neg Hx   . Hyperlipidemia Neg Hx   . Kidney disease Neg Hx   . Stroke Neg Hx    Social History  Substance Use Topics  . Smoking status: Current Every Day Smoker -- 2.00 packs/day    Types: Cigarettes  . Smokeless tobacco: Never Used  . Alcohol Use: No    Review of Systems  Constitutional: Negative for fever and chills.  HENT: Negative for congestion and facial swelling.   Eyes: Negative for discharge and visual disturbance.  Respiratory: Negative for shortness of breath.   Cardiovascular: Negative for chest pain and palpitations.  Gastrointestinal: Negative for vomiting, abdominal pain  and diarrhea.  Musculoskeletal: Positive for myalgias and arthralgias.  Skin: Negative for color change and rash.  Neurological: Negative for tremors, syncope and headaches.  Psychiatric/Behavioral: Negative for confusion and dysphoric mood.      Allergies  Aspirin  Home Medications   Prior to Admission medications   Medication Sig Start Date End Date Taking? Authorizing Provider  acetaminophen (TYLENOL) 500 MG tablet Take 1,000 mg by mouth 2 (two) times daily.   Yes Historical Provider, MD  atorvastatin (LIPITOR) 40 MG tablet  Take 1 tablet (40 mg total) by mouth daily. 09/26/14  Yes Lelon Perla, MD  diltiazem (DILACOR XR) 240 MG 24 hr capsule Take 1 capsule (240 mg total) by mouth daily. 09/26/14  Yes Lelon Perla, MD  metoprolol (LOPRESSOR) 100 MG tablet Take 1 tablet (100 mg total) by mouth 2 (two) times daily. 10/18/14  Yes Lelon Perla, MD  oxyCODONE-acetaminophen (PERCOCET/ROXICET) 5-325 MG per tablet Take 1-2 tablets by mouth every 4 (four) hours as needed for moderate pain. 04/09/14  Yes Alvia Grove, PA-C  sertraline (ZOLOFT) 50 MG tablet Take 1 tablet (50 mg total) by mouth at bedtime. Patient taking differently: Take 50 mg by mouth at bedtime as needed.  07/11/13  Yes Janith Lima, MD  warfarin (COUMADIN) 5 MG tablet Take as directed Patient taking differently: Patient takes 5 mg (1 tab) daily except takes 2.5 mg (1/2 tab) on wednesdays 09/26/14  Yes Lelon Perla, MD  albuterol (PROVENTIL HFA;VENTOLIN HFA) 108 (90 BASE) MCG/ACT inhaler Inhale 2 puffs into the lungs every 6 (six) hours as needed for wheezing or shortness of breath.    Historical Provider, MD  clonazePAM (KLONOPIN) 1 MG tablet Take 1 tablet (1 mg total) by mouth 3 (three) times daily. Patient not taking: Reported on 11/20/2014 01/29/13   Janith Lima, MD  enoxaparin (LOVENOX) 80 MG/0.8ML injection Inject 0.7 mLs (70 mg total) into the skin every 12 (twelve) hours. Please inject 70 mg total every 12 hours at 6:00 pm and 6:00 am until you go to the coumadin clinic on 04/12/14. Patient not taking: Reported on 08/08/2014 04/09/14   Alvia Grove, PA-C  gabapentin (NEURONTIN) 100 MG capsule Take 1 capsule (100 mg total) by mouth at bedtime. Patient not taking: Reported on 11/20/2014 07/11/13   Janith Lima, MD   BP 109/68 mmHg  Pulse 79  Temp(Src) 98.1 F (36.7 C) (Oral)  Resp 13  Ht 5\' 10"  (1.778 m)  Wt 178 lb 12.7 oz (81.1 kg)  BMI 25.65 kg/m2  SpO2 96% Physical Exam  Constitutional: He is oriented to person, place, and time.  He appears well-developed and well-nourished.  HENT:  Head: Normocephalic and atraumatic.  Eyes: EOM are normal. Pupils are equal, round, and reactive to light.  Neck: Normal range of motion. Neck supple. No JVD present.  Cardiovascular: Normal rate and regular rhythm.  Exam reveals no gallop and no friction rub.   No murmur heard. Pulmonary/Chest: No respiratory distress. He has no wheezes.  Abdominal: He exhibits no distension. There is no tenderness. There is no rebound and no guarding.  Musculoskeletal: Normal range of motion.  No noted palpable pulse to the left upper extremity. No radial ulnar or brachial pulse palpated. Not dopplerable. Patient with intact motor and sensation. Prolonged caprefill.  Neurological: He is alert and oriented to person, place, and time.  Skin: No rash noted. No pallor.  Psychiatric: He has a normal mood and affect. His behavior is  normal.    ED Course  Procedures (including critical care time) Labs Review Labs Reviewed  CBC WITH DIFFERENTIAL/PLATELET - Abnormal; Notable for the following:    WBC 10.7 (*)    All other components within normal limits  BASIC METABOLIC PANEL - Abnormal; Notable for the following:    Glucose, Bld 105 (*)    All other components within normal limits  PROTIME-INR - Abnormal; Notable for the following:    Prothrombin Time 18.1 (*)    All other components within normal limits  CBC - Abnormal; Notable for the following:    WBC 13.1 (*)    All other components within normal limits  HEPARIN LEVEL (UNFRACTIONATED) - Abnormal; Notable for the following:    Heparin Unfractionated 0.16 (*)    All other components within normal limits  BASIC METABOLIC PANEL - Abnormal; Notable for the following:    Sodium 133 (*)    Chloride 100 (*)    Glucose, Bld 122 (*)    Calcium 8.4 (*)    All other components within normal limits  MRSA PCR SCREENING  APTT  TYPE AND SCREEN    Imaging Review Ct Angio Up Extrem Left W/cm &/or  Wo/cm  11/20/2014  CLINICAL DATA:  64YOM here for pain in left arm since Sunday. Sent here for blood clot in arm. Pt arm discolored and no radial pulse felt. Pt hand less color than right. Per Vascular surgeon- scan from aortic arch through finger tips. History of left axillary to brachial arterial bypass using reversed saphenous vein graft 04/08/2014 EXAM: CT ANGIOGRAPHY OF THE LEFT UPPEREXTREMITY TECHNIQUE: Multidetector CT imaging of the left upperwas performed using the standard protocol during bolus administration of intravenous contrast. Multiplanar CT image reconstructions and MIPs were obtained to evaluate the vascular anatomy. CONTRAST:  159mL OMNIPAQUE IOHEXOL 350 MG/ML SOLN COMPARISON:  08/08/2014 FINDINGS: Partially calcified plaque in the distal aortic arch and visualized proximal descending segment of the thoracic aorta. There is classic 3 vessel brachiocephalic arterial origin anatomy. The proximal native left subclavian artery is patent. There is occlusion of the left subclavian artery at its lateral margin, overlying the first rib. Occlusion of the vein graft. There is some mild inflammatory/ edematous change around the axillary/brachial artery junction. There is minimal collateral reconstitution of flow in the proximal brachial artery, only faintly seen distally such that there is a very limited assessment of the distal runoff in the left upper extremity. New 11 mm sub solid subpleural nodule in the anterior segment left upper lobe. There are smaller nodules in the apical and posterior segments. Stable 4 mm nodule in the anterior segment image 7. Review of the MIP images confirms the above findings. IMPRESSION: 1. Origin occlusion of the left axillary to brachial artery graft. 2. Progression of pulmonary nodules in the left upper lobe. Recommend CT chest without contrast when the patient is stable for more complete evaluation. Electronically Signed   By: Lucrezia Europe M.D.   On: 11/20/2014 16:03    I have personally reviewed and evaluated these images and lab results as part of my medical decision-making.   EKG Interpretation None      MDM   Final diagnoses:  Left arm pain  Ischemia of extremity  Arterial occlusion [facility])    64 yo M with concerns for acute arterial occlusion. No noted pulses felt her Doppler bedside. Heparin gtt started. Called Dr. Kellie Simmering, vascular surgery. He's concerned as well. Emergent CT angio ordered, patient taken directly to OR.  The patients results and plan were reviewed and discussed.   Any x-rays performed were independently reviewed by myself.   CRITICAL CARE Performed by: Cecilio Asper   Total critical care time: 40 min  Critical care time was exclusive of separately billable procedures and treating other patients.  Critical care was necessary to treat or prevent imminent or life-threatening deterioration.  Critical care was time spent personally by me on the following activities: development of treatment plan with patient and/or surrogate as well as nursing, discussions with consultants, evaluation of patient's response to treatment, examination of patient, obtaining history from patient or surrogate, ordering and performing treatments and interventions, ordering and review of laboratory studies, ordering and review of radiographic studies, pulse oximetry and re-evaluation of patient's condition.   Differential diagnosis were considered with the presenting HPI.  Medications  metoprolol (LOPRESSOR) tablet 100 mg (100 mg Oral Given 11/21/14 1027)  atorvastatin (LIPITOR) tablet 40 mg (40 mg Oral Given 11/21/14 1027)  diltiazem (DILACOR XR) 24 hr capsule 240 mg (240 mg Oral Given 11/21/14 1026)  acetaminophen (TYLENOL) tablet 1,000 mg (1,000 mg Oral Given 11/21/14 1027)  albuterol (PROVENTIL) (2.5 MG/3ML) 0.083% nebulizer solution 3 mL (not administered)  oxyCODONE-acetaminophen (PERCOCET/ROXICET) 5-325 MG per tablet 1-2 tablet (1  tablet Oral Given 11/21/14 0620)  sertraline (ZOLOFT) tablet 50 mg (50 mg Oral Given 11/21/14 0053)  0.9 %  sodium chloride infusion (not administered)  potassium chloride SA (K-DUR,KLOR-CON) CR tablet 20-40 mEq (not administered)  cefUROXime (ZINACEF) 1.5 g in dextrose 5 % 50 mL IVPB (1.5 g Intravenous Given 11/21/14 0104)  docusate sodium (COLACE) capsule 100 mg (100 mg Oral Given 11/21/14 1026)  ondansetron (ZOFRAN) injection 4 mg (not administered)  alum & mag hydroxide-simeth (MAALOX/MYLANTA) 200-200-20 MG/5ML suspension 15-30 mL (not administered)  pantoprazole (PROTONIX) EC tablet 40 mg (40 mg Oral Given 11/21/14 1027)  labetalol (NORMODYNE,TRANDATE) injection 10 mg (not administered)  hydrALAZINE (APRESOLINE) injection 5 mg (not administered)  metoprolol (LOPRESSOR) injection 2-5 mg (not administered)  guaiFENesin-dextromethorphan (ROBITUSSIN DM) 100-10 MG/5ML syrup 15 mL (not administered)  phenol (CHLORASEPTIC) mouth spray 1 spray (not administered)  heparin ADULT infusion 100 units/mL (25000 units/250 mL) (1,000 Units/hr Intravenous Rate/Dose Change 11/21/14 0749)  0.9 %  sodium chloride infusion ( Intravenous New Bag/Given 11/21/14 0310)  morphine 2 MG/ML injection 2-3 mg (2 mg Intravenous Given 11/21/14 0109)  bisacodyl (DULCOLAX) suppository 10 mg (not administered)  magnesium hydroxide (MILK OF MAGNESIA) suspension 30 mL (not administered)  acetaminophen (TYLENOL) tablet 325-650 mg (not administered)    Or  acetaminophen (TYLENOL) solution 325-650 mg (not administered)  HYDROmorphone (DILAUDID) 1 MG/ML injection (not administered)  Influenza vac split quadrivalent PF (FLUARIX) injection 0.5 mL (not administered)  warfarin (COUMADIN) tablet 10 mg (not administered)  Warfarin - Pharmacist Dosing Inpatient (not administered)  nicotine (NICODERM CQ - dosed in mg/24 hours) patch 21 mg (not administered)  iohexol (OMNIPAQUE) 350 MG/ML injection 100 mL (100 mLs Intravenous Contrast  Given 11/20/14 1533)  Warfarin - Physician Dosing Inpatient ( Does not apply Duplicate 25/00/37 0488)    Filed Vitals:   11/21/14 0400 11/21/14 0600 11/21/14 0700 11/21/14 0712  BP: 111/81 124/89  109/68  Pulse: 70 82  79  Temp:   98.1 F (36.7 C)   TempSrc:   Oral   Resp: 13 15  13   Height:      Weight:      SpO2: 94% 96%  96%    Final diagnoses:  Left arm pain  Ischemia  of extremity  Arterial occlusion [facility])    Admission/ observation were discussed with the admitting physician, patient and/or family and they are comfortable with the plan.      Deno Etienne, DO 11/21/14 1108

## 2014-11-20 NOTE — ED Notes (Signed)
Report called to OR  

## 2014-11-20 NOTE — Op Note (Signed)
OPERATIVE REPORT  Date of Surgery: 11/20/2014  Surgeon: Tinnie Gens, MD  Assistant: Leontine Locket PA  Pre-op Diagnosis: [place] Ischemic Left Arm with Occlusive Axiol-Brachial Bypass  Post-op Diagnosis: same as pre-op plus severe Occlusive Disease Brachial, Radial and Ulnar Artery.  Procedure: Procedure(s): 1.  Thrombectomy Left Axilo-Brachial Bypass  2.  Thromboendarterecotmy of Left Brachial Artery with Fogarty Thrombectomy of Radial and Ulnar Arteries with Dacron patch angioplasty Left Brachial Artery. 3. Intraoperative  Arteriogram times four.  Anesthesia: General  EBL: 540 cc  Complications: None  The patient was taken the operating room placed in supine position at which time satisfactory general endotracheal anesthesia was administered. The left upper chest wall axilla and left upper extremity were prepped Betadine scrub and solution draped in routine sterile manner. Incision was made in the axilla through the previous scar where a axillobrachial saphenous vein graft had been placed by Dr. Oneida Alar in February 2016. The distal anastomosis of the vein graft to the proximal brachial artery was exposed. It was in and end anastomosis. Patient was then heparinized and a transverse opening made in the head of the vein graft. The vein graft was thrombectomized with some difficulty initially all of the thrombus was not removed all of the Fogarty would traverse it easily go into the aortic arch. I then passed a 5 dilator proximally and this dislodged a adherent thrombus which was easily thrombectomized and followed by explosive inflow. Fogarty was passed distally and would need obstruction in the brachial artery where previous patch angioplasty had been performed. Following this the opening in the hood of the vein graft was reclosed with 6-0 Prolene. Attention turned to the antecubital area where incision was made to the previous scar carried down through the subcutaneous tissue. The brachial  artery was diffusely diseased. A long endarterectomy had been performed down to the origin of the radial ulnar arteries previously. Vein patch angioplasty was performed. This was all diffusely dilated and actually aneurysmal in one area. This was dissected free and control of the radial ulnar arteries was obtained longitudinal opening made through the vein patch. There was abundant amount of laminated thrombus in the artery was completely occluded. Fogarty would pass down the ulnar branch and it was a relatively smooth vessel after extending the arteritis down into the origin of the ulnar branch. The radial branch was diffusely diseased and there was a lot of debris at the origin of the radial branch which was removed under direct vision as best as possible. The entire area was quite degenerative. Fogarty was then passed up the proximal brachial artery and there was good inflow after removing some intimal debris with the Fogarty. Additional passes were performed and revealed no further debris. The long arteritis me was then fashioned appropriately excising a large amount of a dilated vein and a new piece of back on patch was used to sew in a new patch angioplasty which went out on to the ulnar artery. Prior to completion of this appropriate flushing was performed and following completion there was Doppler flow intermittently in the radial artery and at the tip of the endarterectomy. Intraoperative arteriogram was then performed using a butterfly needle in the proximal exposure in the axilla. The brachial artery between the vein graft in the antecubital area was widely patent but there was one area that still had intimal debris within it. Therefore a second opening was made in the patch distally and a Fogarty reach repassed up the brachial artery and more intimal debris removed  followed by excellent inflow. There was backbleeding coming from the radial ulnar arteries. The patch was reclosed with 6-0 Prolene clamps  released and again there was flow in the brachial artery at the area of the patch angioplasty and intermittent flow in the distal radial artery which at times was quite good and at other times was harder to hear. I do not feel there were any other good options for this other than bypassing from the axillary area down to the mid forearm and the patient had had saphenous vein utilized previously. Therefore we decided to observe this on heparin to see how the perfusion to the left hand-wise. Adequate hemostasis was achieved following administration of protamine. Wounds were closed in layers of Vicryl subcuticular fashion with Dermabond sterile dressing applied and patient taken to recovery room in stable condition  Procedure Details:   Tinnie Gens, MD 11/20/2014 9:05 PM

## 2014-11-20 NOTE — H&P (Signed)
Vascular Surgery H&P  Chief Complaint: Pain and numbness left upper extremity since 3 days ago  HPI: [name] is a 64 y.o. male who presents for evaluation of occlusion of left axillary-brachial bypass. Patient states that he developed discomfort and numbness in the left upper extremity 3 days ago in the early morning. Patient has a history of 2 operative procedures by Dr. Juanda Crumble feels February 26 and February 29. Initially he had exploration of the left brachial artery after acute ischemia occurred. He had thromboendarterectomy of the brachial artery with thrombectomy with less than ideal flow being reestablished. Patient returned to the operating room and had saphenous vein graft from the axillary artery to the brachial artery with satisfactory result. He also had ligation of an axillary artery aneurysm. Patient was seen by Dr. Oneida Alar 08/08/2014 with 2+ pulse in the wrist and well-perfused left hand. Patient has continued to smoke at least 1 pack of cigarettes per day even since this procedure. Patient is on chronic anticoagulation-warfarin for atrial fibrillation but INR today is 1.49. Patient does not have severe pain in the left hand present time but does have numbness and tingling and weakness.   Past Medical History  Diagnosis Date  . Atrial fibrillation ([place])   . Hypertension   . Hyperlipidemia   . COPD (chronic obstructive pulmonary disease) ([place])   . Amaurosis fugax of left eye     history of   . Hx of echocardiogram     Echo (07/09/13):  Mild LVH, EF 55-60%, MAC, mild MR, severe LAE, mod RAE   Past Surgical History  Procedure Laterality Date  . Embolectomy Left 04/05/2014    Procedure: THROMBO ENDARTERECTOMY OF LEFT BRACHIAL, RADIAL AND ULNAR ARTERY,  Left Radial artery cut down and radial artery thrombectomy.;  Surgeon: Elam Dutch, MD;  Location: [facility] OR;  Service: Vascular;  Laterality: Left;  . Patch angioplasty Left 04/05/2014    Procedure: LEFT ARM VEIN PATCH  ANGIOPLASTY;  Surgeon: Elam Dutch, MD;  Location: [place];  Service: Vascular;  Laterality: Left;  . Arch aortogram N/A 04/05/2014    Procedure: ARCH AORTOGRAM, FIRST ORDER CATHETERIZATION LEFT SUBCLAVIAN ARTERY;  Surgeon: Elam Dutch, MD;  Location: [place];  Service: Vascular;  Laterality: N/A;  . Axillary-femoral bypass graft Left 04/08/2014    Procedure: LEFT AXILLARY ARTERY TO BRACHIAL ARTERY BYPASS USING NON REVERSE LEFT GREATER SAPHENOUS VEIN ,LIGATION OF LEFT AXILLARY ARTERY ANEURYSM;  Surgeon: Elam Dutch, MD;  Location: [place];  Service: Vascular;  Laterality: Left;  . Vein harvest Left 04/08/2014    Procedure: LEFT GREATER SAPHENOUS VEIN HARVEST;  Surgeon: Elam Dutch, MD;  Location: [facility] OR;  Service: Vascular;  Laterality: Left;   Social History   Social History  . Marital Status: Divorced    Spouse Name: N/A  . Number of Children: N/A  . 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 Smoking status: Current Every Day Smoker -- 2.00 packs/day    Types: Cigarettes  . Smokeless tobacco: Never Used  . Alcohol Use: No  . Drug Use: No  . Sexual Activity: Not Currently   Other Topics Concern  . None   Social History Narrative   Family History  Problem Relation Age of Onset  . Hypertension Mother   . Cancer Mother   . Cancer Father     gastric cancer  . Cancer Sister   . Early  death Neg Hx   . Heart disease Neg Hx   . Hyperlipidemia Neg Hx   . Kidney disease Neg Hx   . Stroke Neg Hx    Allergies  Allergen Reactions  . Aspirin Nausea And Vomiting   Prior to Admission medications   Medication Sig Start Date End Date Taking? Authorizing Provider  acetaminophen (TYLENOL) 500 MG tablet Take 1,000 mg by mouth 2 (two) times daily.   Yes Historical Provider, MD  atorvastatin (LIPITOR) 40 MG tablet Take 1 tablet (40 mg total) by mouth daily. 09/26/14  Yes Lelon Perla, MD  diltiazem (DILACOR XR) 240  MG 24 hr capsule Take 1 capsule (240 mg total) by mouth daily. 09/26/14  Yes Lelon Perla, MD  metoprolol (LOPRESSOR) 100 MG tablet Take 1 tablet (100 mg total) by mouth 2 (two) times daily. 10/18/14  Yes Lelon Perla, MD  oxyCODONE-acetaminophen (PERCOCET/ROXICET) 5-325 MG per tablet Take 1-2 tablets by mouth every 4 (four) hours as needed for moderate pain. 04/09/14  Yes Alvia Grove, PA-C  sertraline (ZOLOFT) 50 MG tablet Take 1 tablet (50 mg total) by mouth at bedtime. Patient taking differently: Take 50 mg by mouth at bedtime as needed.  07/11/13  Yes Janith Lima, MD  warfarin (COUMADIN) 5 MG tablet Take as directed 09/26/14  Yes Lelon Perla, MD  albuterol (PROVENTIL HFA;VENTOLIN HFA) 108 (90 BASE) MCG/ACT inhaler Inhale 2 puffs into the lungs every 6 (six) hours as needed for wheezing or shortness of breath.    Historical Provider, MD  clonazePAM (KLONOPIN) 1 MG tablet Take 1 tablet (1 mg total) by mouth 3 (three) times daily. Patient not taking: Reported on 11/20/2014 01/29/13   Janith Lima, MD  enoxaparin (LOVENOX) 80 MG/0.8ML injection Inject 0.7 mLs (70 mg total) into the skin every 12 (twelve) hours. Please inject 70 mg total every 12 hours at 6:00 pm and 6:00 am until you go to the coumadin clinic on 04/12/14. Patient not taking: Reported on 08/08/2014 04/09/14   Alvia Grove, PA-C  gabapentin (NEURONTIN) 100 MG capsule Take 1 capsule (100 mg total) by mouth at bedtime. Patient not taking: Reported on 11/20/2014 07/11/13   Janith Lima, MD     Positive ROS: Denies chest pain, dyspnea on exertion but does have occasional dyspnea with climbing up hills. Denies claudication, has had 2 strokes in the past 1 affecting vision in right eye with no evidence of carotid occlusive disease discovered. No history coronary artery disease or diabetes mellitus. Other systems negative and complete review of systems  All other systems have been reviewed and were otherwise negative with the  exception of those mentioned in the HPI and as above.  Physical Exam: Filed Vitals:   11/20/14 1430  BP: 137/95  Pulse: 72  Temp:   Resp: 16    General: Alert, no acute distress HEENT: Normal for age Cardiovascular: Regular rate and rhythm. Carotid pulses 2+, no bruits audible Respiratory: Clear to auscultation. No cyanosis, no use of accessory musculature GI: No organomegaly, abdomen is soft and non-tender Skin: No lesions in the area of chief complaint Neurologic: Sensation intact distally Psychiatric: Patient is competent for consent with normal mood and affect Musculoskeletal: No obvious deformities Extremities: Left hand is cool and pale in appearance. Slightly weak grip. No palpable brachial or radial or ulnar pulse. Right upper extremity with 2+ pulses palpable. Right hand pink and well perfused. 3+ carotid pulses bilaterally.  Labs reviewed: Hemoglobin 15 g,  creatinine 0.9, INR 1.49.  Imaging reviewed: Patient sent for urgent CT angiogram of aortic arch and left upper extremity and then will go to the OR for attempted thrombectomy of bypass with possible redo bypass.   Assessment/Plan:  Occluded left axillary brachial bypass with possible occlusion of distal vessels Previous thromboendarterectomy of brachial artery and antecubital area with Fogarty thrombectomy of radial and ulnar arteries Previous left axillobrachial bypass with saphenous vein 04/08/2014 Ongoing tobacco abuse Atrial fibrillation on chronic anticoagulation but currently INR 1.49  We'll take patient to OR for attempt at thrombectomy of bypass which may not be successful. Patient could be in jeopardy of limb loss. Discussed this with him and he would like to proceed.   Tinnie Gens, MD 11/20/2014 3:27 PM

## 2014-11-20 NOTE — Anesthesia Procedure Notes (Signed)
Procedure Name: Intubation Date/Time: 11/20/2014 4:27 PM Performed by: Williemae Area B Pre-anesthesia Checklist: Patient identified, Emergency Drugs available, Suction available and Patient being monitored Patient Re-evaluated:Patient Re-evaluated prior to inductionOxygen Delivery Method: Circle system utilized Preoxygenation: Pre-oxygenation with 100% oxygen Intubation Type: IV induction Ventilation: Mask ventilation without difficulty Laryngoscope Size: Mac and 4 Grade View: Grade II Tube type: Oral Tube size: 7.5 mm Number of attempts: 1 Airway Equipment and Method: Stylet Placement Confirmation: ETT inserted through vocal cords under direct vision,  positive ETCO2 and breath sounds checked- equal and bilateral Secured at: 21 (cm at upper gum/teeth) cm Tube secured with: Tape Dental Injury: Teeth and Oropharynx as per pre-operative assessment

## 2014-11-20 NOTE — ED Notes (Signed)
Pt here for pain in left arm since Sunday. Sent here for blood clot in arm. Pt arm discolored and no radial pulse felt. Pt hand color than right.

## 2014-11-21 ENCOUNTER — Encounter (HOSPITAL_COMMUNITY): Payer: Self-pay | Admitting: Vascular Surgery

## 2014-11-21 LAB — BASIC METABOLIC PANEL
Anion gap: 9 (ref 5–15)
BUN: 7 mg/dL (ref 6–20)
CHLORIDE: 100 mmol/L — AB (ref 101–111)
CO2: 24 mmol/L (ref 22–32)
Calcium: 8.4 mg/dL — ABNORMAL LOW (ref 8.9–10.3)
Creatinine, Ser: 0.94 mg/dL (ref 0.61–1.24)
GFR calc Af Amer: >60 mL/min (ref >60)
GFR calc non Af Amer: >60 mL/min (ref >60)
GLUCOSE: 122 mg/dL — AB (ref 65–99)
POTASSIUM: 3.7 mmol/L (ref 3.5–5.1)
Sodium: 133 mmol/L — ABNORMAL LOW (ref 135–145)

## 2014-11-21 LAB — CBC
HEMATOCRIT: 40.6 % (ref 39.0–52.0)
Hemoglobin: 13.8 g/dL (ref 13.0–17.0)
MCH: 31.9 pg (ref 26.0–34.0)
MCHC: 34 g/dL (ref 30.0–36.0)
MCV: 94 fL (ref 78.0–100.0)
Platelets: 247 10*3/uL (ref 150–400)
RBC: 4.32 MIL/uL (ref 4.22–5.81)
RDW: 13 % (ref 11.5–15.5)
WBC: 13.1 10*3/uL — ABNORMAL HIGH (ref 4.0–10.5)

## 2014-11-21 LAB — APTT: APTT: 72 s — AB (ref 24–37)

## 2014-11-21 LAB — MRSA PCR SCREENING: MRSA BY PCR: NEGATIVE

## 2014-11-21 LAB — HEPARIN LEVEL (UNFRACTIONATED): Heparin Unfractionated: 0.16 IU/mL — ABNORMAL LOW (ref 0.30–0.70)

## 2014-11-21 MED ORDER — WARFARIN - PHARMACIST DOSING INPATIENT: Freq: Every day | Status: DC

## 2014-11-21 MED ORDER — NICOTINE 21 MG/24HR TD PT24
21.0000 mg | MEDICATED_PATCH | Freq: Every day | TRANSDERMAL | Status: DC
  Administered 2014-11-21 – 2014-11-25 (×5): 21 mg via TRANSDERMAL
  Filled 2014-11-21 (×5): qty 1

## 2014-11-21 MED ORDER — WARFARIN - PHYSICIAN DOSING INPATIENT: Freq: Every day | Status: AC

## 2014-11-21 MED ORDER — INFLUENZA VAC SPLIT QUAD 0.5 ML IM SUSY
0.5000 mL | PREFILLED_SYRINGE | INTRAMUSCULAR | Status: DC
  Filled 2014-11-21: qty 0.5

## 2014-11-21 MED ORDER — WARFARIN SODIUM 10 MG PO TABS
10.0000 mg | ORAL_TABLET | Freq: Once | ORAL | Status: AC
Start: 2014-11-21 — End: 2014-11-21
  Administered 2014-11-21: 10 mg via ORAL
  Filled 2014-11-21 (×2): qty 1

## 2014-11-21 NOTE — Progress Notes (Signed)
ANTICOAGULATION CONSULT NOTE - Initial Consult  Pharmacy Consult for heparin/warfarin Indication: atrial fibrillation  Allergies  Allergen Reactions  . Aspirin Nausea And Vomiting    Patient Measurements: Height: 5\' 10"  (177.8 cm) Weight: 178 lb 12.7 oz (81.1 kg) IBW/kg (Calculated) : 73 Heparin Dosing Weight: 81 kg  Vital Signs: Temp: 98.1 F (36.7 C) (10/13 0700) Temp Source: Oral (10/13 0700) BP: 109/68 mmHg (10/13 0712) Pulse Rate: 79 (10/13 0712)  Labs:  Recent Labs  11/20/14 1417 11/21/14 0307  HGB 15.3 13.8  HCT 45.3 40.6  PLT 246 247  LABPROT 18.1*  --   INR 1.49  --   HEPARINUNFRC  --  0.16*  CREATININE 0.90 0.94    Estimated Creatinine Clearance: 80.9 mL/min (by C-G formula based on Cr of 0.94).   Medical History: Past Medical History  Diagnosis Date  . Atrial fibrillation ([place])   . Hypertension   . Hyperlipidemia   . COPD (chronic obstructive pulmonary disease) ([place])   . Amaurosis fugax of left eye     history of   . Hx of echocardiogram     Echo (07/09/13):  Mild LVH, EF 55-60%, MAC, mild MR, severe LAE, mod RAE  . Renal insufficiency    Assessment: 64 yo male presenting with pain and numbness in the LUE x 3 days. Hx of thromboendarterectomy in Feb  PMH: afib on warfarin, HTN, HLD, COPD  AC: heparin/warfarin for afib. Patient is s/p thombectomy on 10/12. Heparin at 1000 units/hr and Warfarin 10 mg x 1 10/13 INR 1.49, HL 0.16  Warfarin PTA 2.5 mg Wed and 5 mg AOD  Renal: SCr 0.94  Heme: H&H 13.8/40.6, Plt 247  Goal of Therapy:  INR 2-3 Heparin level 0.3-0.7 units/ml Monitor platelets by anticoagulation protocol: Yes   Plan:  Heparin 1000 units/hr x 1 day per MD Warfarin 10 mg x 1 tonight per MD Pharmacy to take over management 10/14 HL and INR in AM  Monitor for s/sx of bleeding  Levester Fresh, PharmD, BCPS Clinical Pharmacist Pager [phone] 11/21/2014 10:40 AM

## 2014-11-21 NOTE — Progress Notes (Signed)
UR COMPLETED  

## 2014-11-21 NOTE — Clinical Documentation Improvement (Signed)
Vascular Surgery  Abnormal Lab/Test Results:  11/21/14: Na= 133; 11/20/14: Na= 136  Possible Clinical Conditions associated with below indicators  Hyponatremia  Other Condition  Cannot Clinically Determine  Please exercise your independent, professional judgment when responding. A specific answer is not anticipated or expected.   Thank You,  Rolm Gala, RN, [place] [phone]

## 2014-11-21 NOTE — Progress Notes (Signed)
Patient ID: [name], male   DOB: 11-Apr-1950, 64 y.o.   MRN: [id number] Vascular Surgery Progress Note  Subjective: 12 hours post-thrombectomy left axillobrachial bypass with extensive endarterectomy of brachial artery with Fogarty thrombectomy radial and ulnar arteries and Dacron patch angioplasty. Patient states left hand feels much better. No pa pain or numbness.  Objective:  Filed Vitals:   11/21/14 0600  BP: 124/89  Pulse: 82  Temp:   Resp: 15    General alert and oriented 3 Left arm with no significant edema Good radial and ulnar flow with Doppler left wrist-hand is pink   Labs:  Recent Labs Lab 11/20/14 1417 11/21/14 0307  CREATININE 0.90 0.94    Recent Labs Lab 11/20/14 1417 11/21/14 0307  NA 136 133*  K 3.6 3.7  CL 102 100*  CO2 24 24  BUN 10 7  CREATININE 0.90 0.94  GLUCOSE 105* 122*  CALCIUM 9.3 8.4*    Recent Labs Lab 11/20/14 1417 11/21/14 0307  WBC 10.7* 13.1*  HGB 15.3 13.8  HCT 45.3 40.6  PLT 246 247    Recent Labs Lab 11/20/14 1417  INR 1.49    I/O last 3 completed shifts: In: 2050 [I.V.:2050] Out: 1140 [Urine:540; Blood:600]  Imaging: Ct Angio Up Extrem Left W/cm &/or Wo/cm  11/20/2014  CLINICAL DATA:  64YOM here for pain in left arm since Sunday. Sent here for blood clot in arm. Pt arm discolored and no radial pulse felt. Pt hand less color than right. Per Vascular surgeon- scan from aortic arch through finger tips. History of left axillary to brachial arterial bypass using reversed saphenous vein graft 04/08/2014 EXAM: CT ANGIOGRAPHY OF THE LEFT UPPEREXTREMITY TECHNIQUE: Multidetector CT imaging of the left upperwas performed using the standard protocol during bolus administration of intravenous contrast. Multiplanar CT image reconstructions and MIPs were obtained to evaluate the vascular anatomy. CONTRAST:  128mL OMNIPAQUE IOHEXOL 350 MG/ML SOLN COMPARISON:  08/08/2014 FINDINGS: Partially calcified plaque in the distal  aortic arch and visualized proximal descending segment of the thoracic aorta. There is classic 3 vessel brachiocephalic arterial origin anatomy. The proximal native left subclavian artery is patent. There is occlusion of the left subclavian artery at its lateral margin, overlying the first rib. Occlusion of the vein graft. There is some mild inflammatory/ edematous change around the axillary/brachial artery junction. There is minimal collateral reconstitution of flow in the proximal brachial artery, only faintly seen distally such that there is a very limited assessment of the distal runoff in the left upper extremity. New 11 mm sub solid subpleural nodule in the anterior segment left upper lobe. There are smaller nodules in the apical and posterior segments. Stable 4 mm nodule in the anterior segment image 7. Review of the MIP images confirms the above findings. IMPRESSION: 1. Origin occlusion of the left axillary to brachial artery graft. 2. Progression of pulmonary nodules in the left upper lobe. Recommend CT chest without contrast when the patient is stable for more complete evaluation. Electronically Signed   By: Lucrezia Europe M.D.   On: 11/20/2014 16:03    Assessment/Plan:  POD #1  LOS: 1 day  s/p Procedure(s): 1.  Thrombectomy Left Axilo-Brachial Bypass  2.  Thromboendarterecotmy of Left Brachial Artery with Fogarty Thrombectomy of Radial and Ulnar Arteries with Dacron patch angioplasty Left Brachial Artery. 3. Intraoperative  Arteriogram times four.  Patent left axillobrachial bypass with brachial endarterectomy flow intact distally and good flow in radial and ulnar arteries at wrist  #1 once  again emphasized importance of tobacco cessation as patient has continued to smoke Explained to him he is at high risk of losing his left upper extremity Will resume Coumadin today Continue heparin for now Transfer to [place], MD 11/21/2014 7:40 AM

## 2014-11-21 NOTE — Care Management Note (Signed)
Case Management Note  Patient Details  Name: [name] MRN: [id number] Date of Birth: Dec 22, 1950  Subjective/Objective:    Pt is s/p 12 hours post-thrombectomy left axillobrachial bypass with extensive endarterectomy of brachial artery with Fogarty thrombectomy radial and ulnar arteries and Dacron patch angioplasty.                Action/Plan:  Pt is independent from home with support provided by neighbors when needed.  CM assessed pt and informed of PT/OT recommendations at home, pt is refusing [place] at this time.  Pt stated he had [place] approximately 6 months ago and he feels that he does not need it again.  CM will continue to monitor for disposition need.   Expected Discharge Date:                  Expected Discharge Plan:  [place]  In-House Referral:     Discharge planning Services  CM Consult  Post Acute Care Choice:    Choice offered to:  Patient  DME Arranged:    DME Agency:     HH Arranged:    [place] Agency:     Status of Service:  In process, will continue to follow  Medicare Important Message Given:    Date Medicare IM Given:    Medicare IM give by:    Date Additional Medicare IM Given:    Additional Medicare Important Message give by:     If discussed at [place] of Stay Meetings, dates discussed:    Additional Comments:  [name], RN 11/21/2014, 2:49 PM

## 2014-11-21 NOTE — Progress Notes (Signed)
  Progress Note    11/21/2014 7:39 AM 1 Day Post-Op  Subjective:  Sleeping-no complaints    Filed Vitals:   11/21/14 0600  BP: 124/89  Pulse: 82  Temp:   Resp: 15    Physical Exam: Cardiac:  irregular Lungs:  Non labored Incisions:  Left arm is wrapped with ace bandage Extremities:  Brisk left radial / ulnar doppler signal   CBC    Component Value Date/Time   WBC 13.1* 11/21/2014 0307   RBC 4.32 11/21/2014 0307   HGB 13.8 11/21/2014 0307   HCT 40.6 11/21/2014 0307   PLT 247 11/21/2014 0307   MCV 94.0 11/21/2014 0307   MCH 31.9 11/21/2014 0307   MCHC 34.0 11/21/2014 0307   RDW 13.0 11/21/2014 0307   LYMPHSABS 2.7 11/20/2014 1417   MONOABS 0.8 11/20/2014 1417   EOSABS 0.1 11/20/2014 1417   BASOSABS 0.0 11/20/2014 1417    BMET    Component Value Date/Time   NA 133* 11/21/2014 0307   K 3.7 11/21/2014 0307   CL 100* 11/21/2014 0307   CO2 24 11/21/2014 0307   GLUCOSE 122* 11/21/2014 0307   BUN 7 11/21/2014 0307   CREATININE 0.94 11/21/2014 0307   CREATININE 1.23 08/07/2014 0913   CALCIUM 8.4* 11/21/2014 0307   GFRNONAA >60 11/21/2014 0307   GFRNONAA >89 03/14/2014 1338   GFRAA >60 11/21/2014 0307   GFRAA >89 03/14/2014 1338    INR    Component Value Date/Time   INR 1.49 11/20/2014 1417   INR 2.1 10/15/2014 1159   INR 2.7 04/02/2010 1021     Intake/Output Summary (Last 24 hours) at 11/21/14 0739 Last data filed at 11/20/14 2250  Gross per 24 hour  Intake   2050 ml  Output   1140 ml  Net    910 ml     Assessment:  64 y.o. male is s/p:  1. Thrombectomy Left Axilo-Brachial Bypass 2. Thromboendarterecotmy of Left Brachial Artery with Fogarty Thrombectomy of Radial and Ulnar Arteries with Dacron patch angioplasty Left Brachial Artery. 3. Intraoperative Arteriogram times four.  1 Day Post-Op  Plan: -pt with brisk doppler flow to left radial/ulnar -heparin to 1000U/hour -will give coumadin 10 mg today and then pharmacy to dose starting  tomorrow -transfer to [place] -discussed with pt the GREAT importance of smoking cessation as he is at risk for limb loss with continued smoking -nicotine patch today -DVT prophylaxis:  Heparin gtt to transition to coumadin    Leontine Locket, PA-C [facility] [phone] 11/21/2014 7:39 AM

## 2014-11-21 NOTE — Discharge Instructions (Signed)

## 2014-11-21 NOTE — Evaluation (Signed)
Physical Therapy Evaluation Patient Details Name: [name] MRN: [id number] DOB: March 11, 1950 Today's Date: 11/21/2014   History of Present Illness  [name] is a 64 y.o. male who presents for evaluation of occlusion of left axillary-brachial bypass. Patient states that he developed discomfort and numbness in the left upper extremity. He has a history of 2 operative procedures by Dr. Juanda Crumble feels February 26 and February 29 with thrombectomy and BPG. S/p axillary brachial BPG 10/12  Clinical Impression  Pt pleasant but fatigued throughout. Pt reports his LUE hurts and he denied attempting to lift arm or move elbow due to fear of pain. Pt educated for elevation of extremity and need for movement. Pt lives alone and only has a neighbor that drops by occasionally. Pt will benefit from acute therapy to maximize mobility, gait, balance and function to decrease burden of care and fall risk.     Follow Up Recommendations Home health PT    Equipment Recommendations  None recommended by PT    Recommendations for Other Services OT consult     Precautions / Restrictions Precautions Precautions: Fall      Mobility  Bed Mobility Overal bed mobility: Modified Independent                Transfers Overall transfer level: Needs assistance   Transfers: Sit to/from Stand Sit to Stand: Supervision         General transfer comment: supervision for lines and safety  Ambulation/Gait Ambulation/Gait assistance: Min guard Ambulation Distance (Feet): 250 Feet Assistive device:  (held onto IV pole throughout for support and stability) Gait Pattern/deviations: Step-through pattern;Decreased stride length   Gait velocity interpretation: Below normal speed for age/gender General Gait Details: cues for posture, safety and sequence with inability to walk unassisted at this time due to tiredness and unsteady gait  Stairs            Wheelchair Mobility    Modified  Rankin (Stroke Patients Only)       Balance Overall balance assessment: Needs assistance   Sitting balance-Leahy Scale: Good       Standing balance-Leahy Scale: Fair                               Pertinent Vitals/Pain Pain Assessment: 0-10 Pain Score: 3  Pain Location: LUE Pain Descriptors / Indicators: Aching Pain Intervention(s): Premedicated before session;Repositioned    Home Living Family/patient expects to be discharged to:: Private residence Living Arrangements: Alone Available Help at Discharge: Neighbor;Available PRN/intermittently Type of Home: House Home Access: Stairs to enter Entrance Stairs-Rails: None Entrance Stairs-Number of Steps: 4 Home Layout: One level Home Equipment: Cane - single point      Prior Function Level of Independence: Independent               Hand Dominance        Extremity/Trunk Assessment   Upper Extremity Assessment: Defer to OT evaluation           Lower Extremity Assessment: RLE deficits/detail;LLE deficits/detail;Overall [facility] for tasks assessed (hip flexion 5/5, knee extension and flexion 4/5) RLE Deficits / Details: hip flexion 5/5, knee extension and flexion 4/5 LLE Deficits / Details: hip flexion 5/5, knee extension and flexion 4/5     Communication   Communication: No difficulties  Cognition Arousal/Alertness: Awake/alert Behavior During Therapy: Flat affect Overall Cognitive Status: Within Functional Limits for tasks assessed  General Comments      Exercises General Exercises - Lower Extremity Long Arc Quad: AROM;Seated;Both;10 reps Heel Slides: AROM;Seated;Both;10 reps      Assessment/Plan    PT Assessment Patient needs continued PT services  PT Diagnosis Difficulty walking;Generalized weakness;Acute pain   PT Problem List Decreased strength;Decreased activity tolerance;Decreased balance;Pain;Decreased mobility  PT Treatment Interventions Gait  training;Functional mobility training;Stair training;Therapeutic activities;Therapeutic exercise;Balance training;Patient/family education   PT Goals (Current goals can be found in the Care Plan section) Acute Rehab PT Goals Patient Stated Goal: return home to my cats and work on the car PT Goal Formulation: With patient Time For Goal Achievement: 12/05/14 Potential to Achieve Goals: Good    Frequency Min 3X/week   Barriers to discharge Decreased caregiver support      Co-evaluation               End of Session   Activity Tolerance: Patient tolerated treatment well Patient left: in bed;with call bell/phone within reach;with bed alarm set Nurse Communication: Mobility status         Time: 5809-9833 PT Time Calculation (min) (ACUTE ONLY): 20 min   Charges:   PT Evaluation $Initial PT Evaluation Tier I: 1 Procedure     PT G CodesMelford [name] 11/21/2014, 9:11 AM Elwyn Reach, [name]

## 2014-11-21 NOTE — Evaluation (Signed)
Occupational Therapy Evaluation Patient Details Name: [name] MRN: [id number] DOB: 1950/08/20 Today's Date: 11/21/2014    History of Present Illness [name] is a 64 y.o. male who presents for evaluation of occlusion of left axillary-brachial bypass. Patient states that he developed discomfort and numbness in the left upper extremity. He has a history of 2 operative procedures by Dr. Juanda Crumble feels February 26 and February 29 with thrombectomy and BPG. S/p axillary brachial BPG 10/12   Clinical Impression   This 64 yo male admitted and underwent above presents to acute OT with increased pain, decreased use of LUE, decreased balance, all affecting his ability to take care of himself at home alone. He will benefit from acute OT with follow up OT at home.    Follow Up Recommendations  Home health OT    Equipment Recommendations  None recommended by OT       Precautions / Restrictions Precautions Precautions: Fall      Mobility Bed Mobility Overal bed mobility: Modified Independent             General bed mobility comments: in and OOB with HOB up and use of rail on left side  Transfers Overall transfer level: Needs assistance Equipment used: 1 person hand held assist Transfers: Sit to/from Stand Sit to Stand: Min guard         General transfer comment: "I feel a little woozy, I think it is medicine" Per nurse he had only had tylenol    Balance Overall balance assessment: Needs assistance Sitting-balance support: No upper extremity supported;Feet supported Sitting balance-Leahy Scale: Good     Standing balance support: No upper extremity supported Standing balance-Leahy Scale: Fair                              ADL Overall ADL's : Needs assistance/impaired Eating/Feeding: Set up;Sitting   Grooming: Minimal assistance;Sitting   Upper Body Bathing: Minimal assitance;Sitting   Lower Body Bathing: Minimal assistance (with min guard  A sit<>stand)   Upper Body Dressing : Minimal assistance;Sitting   Lower Body Dressing: Minimal assistance (with min guard A sit<>stand)   Toilet Transfer: Min guard;Ambulation   Toileting- Clothing Manipulation and Hygiene: Minimal assistance (with min guard A sit<>stand)               Vision Additional Comments: wears glasses          Pertinent Vitals/Pain Pain Assessment: 0-10 Pain Score: 9  Pain Location: LUE Pain Descriptors / Indicators: Aching Pain Intervention(s): Monitored during session;Repositioned;RN gave pain meds during session     Hand Dominance Right   Extremity/Trunk Assessment Upper Extremity Assessment Upper Extremity Assessment: LUE deficits/detail LUE Deficits / Details: Full AROM of digits and wrist; pt says he can move his forearm, elbow, and shoulder but would not show me right now due to too much pain LUE Coordination: decreased gross motor           Communication Communication Communication: No difficulties   Cognition Arousal/Alertness: Awake/alert Behavior During Therapy: WFL for tasks assessed/performed Overall Cognitive Status: Within Functional Limits for tasks assessed                        Exercises   Other Exercises Other Exercises: Encouraged pt to move and use his LUE. I also showed him how to prop up his LUE for edema control in bed and had him do it.  Home Living Family/patient expects to be discharged to:: Private residence Living Arrangements: Alone Available Help at Discharge: Neighbor;Available PRN/intermittently Type of Home: House Home Access: Stairs to enter [employer] of Steps: 4 Entrance Stairs-Rails: None Home Layout: One level     Bathroom Shower/Tub: Tub/shower unit;Curtain Shower/tub characteristics: Architectural technologist: Standard     Home Equipment: Cane - single point          Prior Functioning/Environment Level of Independence: Independent              OT Diagnosis: Generalized weakness;Acute pain   OT Problem List: Decreased strength;Decreased range of motion;Impaired UE functional use;Pain;Impaired balance (sitting and/or standing)   OT Treatment/Interventions: Self-care/ADL training;Patient/family education;Balance training;Therapeutic activities;DME and/or AE instruction    OT Goals(Current goals can be found in the care plan section) Acute Rehab OT Goals Patient Stated Goal: return home to my cats and work on the car I am restoring OT Goal Formulation: With patient Time For Goal Achievement: 11/28/14 Potential to Achieve Goals: Good  OT Frequency: Min 2X/week              End of Session    Activity Tolerance: Patient limited by pain Patient left: in bed;with call bell/phone within reach;with bed alarm set   Time: 1138-1208 OT Time Calculation (min): 30 min Charges:  OT General Charges $OT Visit: 1 Procedure OT Evaluation $Initial OT Evaluation Tier I: 1 Procedure OT Treatments $Self Care/Home Management : 8-22 mins  Almon Register [phone] 11/21/2014, 1:08 PM

## 2014-11-21 NOTE — Anesthesia Postprocedure Evaluation (Signed)
  Anesthesia Post-op Note  Patient: [name]  Procedure(s) Performed: Procedure(s): 1.  Thrombectomy Left Axilo-Brachial Bypass  2.  Thromboendarterecotmy of Left Brachial Artery with Fogarty Thrombectomy of Radial and Ulnar Arteries with Dacron patch angioplasty Left Brachial Artery. 3. Intraoperative  Arteriogram times four. (Left)  Patient Location: PACU  Anesthesia Type:General  Level of Consciousness: awake  Airway and Oxygen Therapy: Patient Spontanous Breathing  Post-op Pain: mild  Post-op Assessment: Post-op Vital signs reviewed, Patient's Cardiovascular Status Stable, Respiratory Function Stable, Patent Airway, No signs of Nausea or vomiting and Pain level controlled LLE Motor Response: Purposeful movement, Responds to commands LLE Sensation: Full sensation RLE Motor Response: Purposeful movement, Responds to commands RLE Sensation: Full sensation      Post-op Vital Signs: Reviewed and stable  Last Vitals:  Filed Vitals:   11/21/14 1530  BP: 122/75  Pulse: 77  Temp:   Resp: 18    Complications: No apparent anesthesia complications

## 2014-11-22 ENCOUNTER — Encounter (HOSPITAL_COMMUNITY): Payer: Self-pay | Admitting: General Practice

## 2014-11-22 LAB — CBC
HEMATOCRIT: 37.1 % — AB (ref 39.0–52.0)
Hemoglobin: 12.7 g/dL — ABNORMAL LOW (ref 13.0–17.0)
MCH: 32.3 pg (ref 26.0–34.0)
MCHC: 34.2 g/dL (ref 30.0–36.0)
MCV: 94.4 fL (ref 78.0–100.0)
Platelets: 209 10*3/uL (ref 150–400)
RBC: 3.93 MIL/uL — ABNORMAL LOW (ref 4.22–5.81)
RDW: 13 % (ref 11.5–15.5)
WBC: 9.9 10*3/uL (ref 4.0–10.5)

## 2014-11-22 LAB — APTT: aPTT: 122 seconds — ABNORMAL HIGH (ref 24–37)

## 2014-11-22 LAB — PROTIME-INR
INR: 1.93 — AB (ref 0.00–1.49)
PROTHROMBIN TIME: 21.9 s — AB (ref 11.6–15.2)

## 2014-11-22 LAB — HEPARIN LEVEL (UNFRACTIONATED)
HEPARIN UNFRACTIONATED: 0.38 [IU]/mL (ref 0.30–0.70)
Heparin Unfractionated: 0.16 IU/mL — ABNORMAL LOW (ref 0.30–0.70)

## 2014-11-22 MED ORDER — WARFARIN - PHYSICIAN DOSING INPATIENT
Freq: Every day | Status: DC
  Administered 2014-11-22: 18:00:00

## 2014-11-22 MED ORDER — WARFARIN SODIUM 5 MG PO TABS
5.0000 mg | ORAL_TABLET | Freq: Once | ORAL | Status: AC
  Administered 2014-11-22: 5 mg via ORAL
  Filled 2014-11-22: qty 1

## 2014-11-22 NOTE — Progress Notes (Signed)
Occupational Therapy Treatment Patient Details Name: LOVELLE LEMA MRN: [id number] DOB: [birth year] Today's Date: 11/22/2014    History of present illness MONTRAY KLIEBERT is a 64 y.o. male who presents for evaluation of occlusion of left axillary-brachial bypass. Patient states that he developed discomfort and numbness in the left upper extremity. He has a history of 2 operative procedures by Dr. Juanda Crumble feels February 26 and February 29 with thrombectomy and BPG. S/p axillary brachial BPG 10/12   OT comments  Pt currently still supervision for mobility and transfers without assistive device.  Increased edema noted in the LUE and hand as well but pt able to integrate into functional tasks spontaneously.  Encouraged AROM and elevated positioning to assist with this as well.  Will continue to follow.   Follow Up Recommendations  Home health OT;Supervision - Intermittent    Equipment Recommendations  None recommended by OT       Precautions / Restrictions Precautions Precautions: Fall Precaution Comments: LUE swelling and pain       Mobility Bed Mobility Overal bed mobility: Modified Independent                Transfers Overall transfer level: Needs assistance Equipment used: 1 person hand held assist Transfers: Sit to/from Stand Sit to Stand: Supervision              Balance     Sitting balance-Leahy Scale: Normal       Standing balance-Leahy Scale: Fair                     ADL Overall ADL's : Needs assistance/impaired     Grooming: Wash/dry face;Oral care;Brushing hair;Supervision/safety;Standing   Upper Body Bathing: Supervision/ safety;Sitting   Lower Body Bathing: Supervison/ safety;Sit to/from stand   Upper Body Dressing : Moderate assistance Upper Body Dressing Details (indicate cue type and reason): For hospital gown secondary to IV Lower Body Dressing: Supervision/safety   Toilet Transfer: Min guard           Functional  mobility during ADLs: Supervision/safety General ADL Comments: Pt up coming back from the bathroom as therapist entered.  Instructed to call for assistance as he is still a little bit wobbly.  He continues to need close supervision for safety with selfcare tasks and still demonstrates decreased AROM at the left elbow with flexion.  Slight edema also still present in the hand and arm as well.                  Cognition   Behavior During Therapy: WFL for tasks assessed/performed Overall Cognitive Status: Within Functional Limits for tasks assessed       Memory:  (Pt with decreased awareness of needing assistance for transfers and OOB.  Unsure if this is baseline. )                            Pertinent Vitals/ Pain       Pain Assessment: Faces Faces Pain Scale: Hurts little more Pain Location: left arm Pain Descriptors / Indicators: Aching Pain Intervention(s): Monitored during session;Repositioned         Frequency Min 2X/week     Progress Toward Goals  OT Goals(current goals can now be found in the care plan section)  Progress towards OT goals: Progressing toward goals     Plan Discharge plan remains appropriate       End of Session     Activity  Tolerance Patient tolerated treatment well   Patient Left in bed;with call bell/phone within reach;with bed alarm set   Nurse Communication Mobility status        Time: 9604-5409 OT Time Calculation (min): 41 min  Charges: OT General Charges $OT Visit: 1 Procedure OT Treatments $Self Care/Home Management : 38-52 mins  Jenniferann Stuckert OTR/L 11/22/2014, 9:28 AM

## 2014-11-22 NOTE — Progress Notes (Signed)
ANTICOAGULATION CONSULT NOTE - Follow Up Consult  Pharmacy Consult for Heparin  Indication: atrial fibrillation  Allergies  Allergen Reactions  . Aspirin Nausea And Vomiting    Patient Measurements: Height: 5\' 10"  (177.8 cm) Weight: 178 lb 12.7 oz (81.1 kg) IBW/kg (Calculated) : 73  Vital Signs: Temp: 98.9 F (37.2 C) (10/14 0536) Temp Source: Oral (10/14 0536) BP: 134/85 mmHg (10/14 0536) Pulse Rate: 74 (10/14 0536)  Labs:  Recent Labs  11/20/14 1417 11/21/14 0307 11/21/14 1500 11/22/14 0359  HGB 15.3 13.8  --  12.7*  HCT 45.3 40.6  --  37.1*  PLT 246 247  --  209  APTT  --   --  72*  --   LABPROT 18.1*  --   --  21.9*  INR 1.49  --   --  1.93*  HEPARINUNFRC  --  0.16*  --  0.16*  CREATININE 0.90 0.94  --   --     Estimated Creatinine Clearance: 80.9 mL/min (by C-G formula based on Cr of 0.94).   Assessment: Maintained on fixed dose of 1000 units/hr on 10/13 by VVS, pharmacy starting management today, HL is 0.16, no issues per RN.   Goal of Therapy:  Heparin level 0.3-0.7 units/ml Monitor platelets by anticoagulation protocol: Yes   Plan:  -Increase heparin to 1150 units/hr -1300 HL  Oneida Mckamey 11/22/2014,5:56 AM

## 2014-11-22 NOTE — Progress Notes (Signed)
      Doppler signal ulnar, radial and palmer arch    Gerri Lins, PAC PA-C

## 2014-11-22 NOTE — Progress Notes (Signed)
Patient ID: [name], male   DOB: Jul 03, 1950, 64 y.o.   MRN: [id number] Vascular Surgery Progress Note  Subjective: One-day post thrombectomy left axillobrachial bypass with extensive left brachial endarterectomy with patch angioplasty Patient states left hand feels much better Does have some swelling in arm he states Patient was on Coumadin preoperatively but INR was only 1.49 at the time of admission although he has been following up on regular basis at the Coumadin clinic  Objective:  Filed Vitals:   11/22/14 0536  BP: 134/85  Pulse: 74  Temp: 98.9 F (37.2 C)  Resp: 17    Left arm incisions examined and are healing nicely with no evidence of infection or hematoma 3+ brachial pulse palpable with left hand pink and well perfused  INR today is 1.94 after 10 mg of Coumadin yesterday Should be over 2 tomorrow since patient has not had Coumadin discontinued preoperatively   Labs:  Recent Labs Lab 11/20/14 1417 11/21/14 0307  CREATININE 0.90 0.94    Recent Labs Lab 11/20/14 1417 11/21/14 0307  NA 136 133*  K 3.6 3.7  CL 102 100*  CO2 24 24  BUN 10 7  CREATININE 0.90 0.94  GLUCOSE 105* 122*  CALCIUM 9.3 8.4*    Recent Labs Lab 11/20/14 1417 11/21/14 0307 11/22/14 0359  WBC 10.7* 13.1* 9.9  HGB 15.3 13.8 12.7*  HCT 45.3 40.6 37.1*  PLT 246 247 209    Recent Labs Lab 11/20/14 1417 11/22/14 0359  INR 1.49 1.93*    I/O last 3 completed shifts: In: 9675 [P.O.:240; I.V.:1500] Out: 930 [Urine:330; Blood:600]  Imaging: Ct Angio Up Extrem Left W/cm &/or Wo/cm  11/20/2014  CLINICAL DATA:  64YOM here for pain in left arm since Sunday. Sent here for blood clot in arm. Pt arm discolored and no radial pulse felt. Pt hand less color than right. Per Vascular surgeon- scan from aortic arch through finger tips. History of left axillary to brachial arterial bypass using reversed saphenous vein graft 04/08/2014 EXAM: CT ANGIOGRAPHY OF THE LEFT UPPEREXTREMITY  TECHNIQUE: Multidetector CT imaging of the left upperwas performed using the standard protocol during bolus administration of intravenous contrast. Multiplanar CT image reconstructions and MIPs were obtained to evaluate the vascular anatomy. CONTRAST:  153mL OMNIPAQUE IOHEXOL 350 MG/ML SOLN COMPARISON:  08/08/2014 FINDINGS: Partially calcified plaque in the distal aortic arch and visualized proximal descending segment of the thoracic aorta. There is classic 3 vessel brachiocephalic arterial origin anatomy. The proximal native left subclavian artery is patent. There is occlusion of the left subclavian artery at its lateral margin, overlying the first rib. Occlusion of the vein graft. There is some mild inflammatory/ edematous change around the axillary/brachial artery junction. There is minimal collateral reconstitution of flow in the proximal brachial artery, only faintly seen distally such that there is a very limited assessment of the distal runoff in the left upper extremity. New 11 mm sub solid subpleural nodule in the anterior segment left upper lobe. There are smaller nodules in the apical and posterior segments. Stable 4 mm nodule in the anterior segment image 7. Review of the MIP images confirms the above findings. IMPRESSION: 1. Origin occlusion of the left axillary to brachial artery graft. 2. Progression of pulmonary nodules in the left upper lobe. Recommend CT chest without contrast when the patient is stable for more complete evaluation. Electronically Signed   By: Lucrezia Europe M.D.   On: 11/20/2014 16:03    Assessment/Plan:  POD #1   LOS:  2 days  s/p Procedure(s): 1.  Thrombectomy Left Axilo-Brachial Bypass  2.  Thromboendarterecotmy of Left Brachial Artery with Fogarty Thrombectomy of Radial and Ulnar Arteries with Dacron patch angioplasty Left Brachial Artery. 3. Intraoperative  Arteriogram times four.  Plan ambulate patient today and give Coumadin 5 mg today Continue heparin until tomorrow  a.m. If INR is 2 or greater tomorrow plan DC home and return to see me in 2 weeks Patient will need to follow-up in Coumadin clinic at the end of next week but in the meantime will resume his normal dose   Tinnie Gens, MD 11/22/2014 7:44 AM

## 2014-11-22 NOTE — Progress Notes (Signed)
ANTICOAGULATION CONSULT NOTE - Follow Up Consult  Pharmacy Consult for Heparin  Indication: atrial fibrillation  Allergies  Allergen Reactions  . Aspirin Nausea And Vomiting    Patient Measurements: Height: 5\' 10"  (177.8 cm) Weight: 178 lb 12.7 oz (81.1 kg) IBW/kg (Calculated) : 73  Vital Signs: Temp: 98.3 F (36.8 C) (10/14 1357) Temp Source: Oral (10/14 1357) BP: 108/69 mmHg (10/14 1357) Pulse Rate: 77 (10/14 1357)  Labs:  Recent Labs  11/20/14 1417 11/21/14 0307 11/21/14 1500 11/22/14 0359 11/22/14 1547  HGB 15.3 13.8  --  12.7*  --   HCT 45.3 40.6  --  37.1*  --   PLT 246 247  --  209  --   APTT  --   --  72* 122*  --   LABPROT 18.1*  --   --  21.9*  --   INR 1.49  --   --  1.93*  --   HEPARINUNFRC  --  0.16*  --  0.16* 0.38  CREATININE 0.90 0.94  --   --   --     Estimated Creatinine Clearance: 80.9 mL/min (by C-G formula based on Cr of 0.94).   Assessment: 25 YOM s/p thrombectomy who was maintained on fixed dose of heparin at 1000 units/hr by VVS, transitioned to dosing per pharmacy. Rate was increased this morning d/t low level and repeat check this afternoon is in range at 0.38 units/mL. Of note, patient also on warfarin with INR of 1.93.  No bleeding noted. CBC stable.  Goal of Therapy:  Heparin level 0.3-0.7 units/ml Monitor platelets by anticoagulation protocol: Yes   Plan:  -continue heparin at 1150 units/hr -daily HL and CBC while on heparin -follow INR for ability to stop heparin  Link Burgeson D. Hamzeh Tall, PharmD, BCPS Clinical Pharmacist Pager: [phone] 11/22/2014 4:45 PM

## 2014-11-23 ENCOUNTER — Inpatient Hospital Stay (HOSPITAL_COMMUNITY): Payer: Medicare Other

## 2014-11-23 DIAGNOSIS — H539 Unspecified visual disturbance: Secondary | ICD-10-CM

## 2014-11-23 LAB — PROTIME-INR
INR: 1.9 — AB (ref 0.00–1.49)
Prothrombin Time: 21.7 seconds — ABNORMAL HIGH (ref 11.6–15.2)

## 2014-11-23 LAB — HEPARIN LEVEL (UNFRACTIONATED)
Heparin Unfractionated: 0.19 IU/mL — ABNORMAL LOW (ref 0.30–0.70)
Heparin Unfractionated: 0.4 IU/mL (ref 0.30–0.70)

## 2014-11-23 LAB — CBC
HEMATOCRIT: 37.4 % — AB (ref 39.0–52.0)
Hemoglobin: 12.8 g/dL — ABNORMAL LOW (ref 13.0–17.0)
MCH: 32.2 pg (ref 26.0–34.0)
MCHC: 34.2 g/dL (ref 30.0–36.0)
MCV: 94 fL (ref 78.0–100.0)
Platelets: 205 10*3/uL (ref 150–400)
RBC: 3.98 MIL/uL — ABNORMAL LOW (ref 4.22–5.81)
RDW: 13 % (ref 11.5–15.5)
WBC: 8.1 10*3/uL (ref 4.0–10.5)

## 2014-11-23 MED ORDER — WARFARIN SODIUM 7.5 MG PO TABS
7.5000 mg | ORAL_TABLET | Freq: Once | ORAL | Status: AC
  Administered 2014-11-23: 7.5 mg via ORAL
  Filled 2014-11-23: qty 1

## 2014-11-23 NOTE — Progress Notes (Signed)
VASCULAR LAB PRELIMINARY  PRELIMINARY  PRELIMINARY  PRELIMINARY  Carotid duplex completed.    Preliminary report:  1-39% ICA stenosis.  Vertebral artery flow is antegrade.   Tagan Bartram, RVT 11/23/2014, 10:12 AM

## 2014-11-23 NOTE — Progress Notes (Signed)
ANTICOAGULATION CONSULT NOTE - Follow Up Consult  Pharmacy Consult for Heparin  Indication: atrial fibrillation  Allergies  Allergen Reactions  . Aspirin Nausea And Vomiting    Patient Measurements: Height: 5\' 10"  (177.8 cm) Weight: 178 lb 12.7 oz (81.1 kg) IBW/kg (Calculated) : 73  Vital Signs: Temp: 98.1 F (36.7 C) (10/15 0525) Temp Source: Oral (10/15 0525) BP: 124/83 mmHg (10/15 0525) Pulse Rate: 66 (10/15 0525)  Labs:  Recent Labs  11/20/14 1417  11/21/14 0307 11/21/14 1500 11/22/14 0359 11/22/14 1547 11/23/14 0349  HGB 15.3  --  13.8  --  12.7*  --  12.8*  HCT 45.3  --  40.6  --  37.1*  --  37.4*  PLT 246  --  247  --  209  --  205  APTT  --   --   --  72* 122*  --   --   LABPROT 18.1*  --   --   --  21.9*  --  21.7*  INR 1.49  --   --   --  1.93*  --  1.90*  HEPARINUNFRC  --   < > 0.16*  --  0.16* 0.38 0.19*  CREATININE 0.90  --  0.94  --   --   --   --   < > = values in this interval not displayed.  Estimated Creatinine Clearance: 80.9 mL/min (by C-G formula based on Cr of 0.94).   Assessment: Maintained on fixed dose of 1000 units/hr on 10/13 by VVS, pharmacy started managing on 10/14, HL is 0.19, no issues per RN. INR still <2 this AM.   Goal of Therapy:  Heparin level 0.3-0.7 units/ml Monitor platelets by anticoagulation protocol: Yes   Plan:  -Increase heparin to 1300 units/hr -1400 HL  [name] 11/23/2014,6:18 AM

## 2014-11-23 NOTE — Progress Notes (Signed)
    Subjective  - POD #3  Complaining of pain in his left arm which is unchanged. Reports seeing blurry lines which was similar to what he saw and his right eye when he had his stroke.  The symptoms are in his left eye.   Physical Exam:  Incision is healing nicely.  He has excellent Doppler signals in the radial and ulnar artery.       Assessment/Plan:  POD #3  Neuro: Patient does not have focal symptoms however does report some visual disturbances.  He has a history of stroke which presented the same way.  Therefore I will get carotid Doppler studies and a MRI of the brain for further evaluation. Anticoagulation: The patient's INR was 1.93 yesterday.  A drop to 1.90 today.  We will ask pharmacy assistance for Coumadin dosing.  Until his INR is greater than 2.0, he will continue with IV heparin Physical therapy: Assess need for home therapy given his difficulty moving his arm secondary to pain  Brabham, [place] 11/23/2014 8:04 AM --  Filed Vitals:   11/23/14 0525  BP: 124/83  Pulse: 66  Temp: 98.1 F (36.7 C)  Resp: 18   No intake or output data in the 24 hours ending 11/23/14 0804   Laboratory CBC    Component Value Date/Time   WBC 8.1 11/23/2014 0349   HGB 12.8* 11/23/2014 0349   HCT 37.4* 11/23/2014 0349   PLT 205 11/23/2014 0349    BMET    Component Value Date/Time   NA 133* 11/21/2014 0307   K 3.7 11/21/2014 0307   CL 100* 11/21/2014 0307   CO2 24 11/21/2014 0307   GLUCOSE 122* 11/21/2014 0307   BUN 7 11/21/2014 0307   CREATININE 0.94 11/21/2014 0307   CREATININE 1.23 08/07/2014 0913   CALCIUM 8.4* 11/21/2014 0307   GFRNONAA >60 11/21/2014 0307   GFRNONAA >89 03/14/2014 1338   GFRAA >60 11/21/2014 0307   GFRAA >89 03/14/2014 1338    COAG Lab Results  Component Value Date   INR 1.90* 11/23/2014   INR 1.93* 11/22/2014   INR 1.49 11/20/2014   No results found for: PTT  Antibiotics Anti-infectives    Start     Dose/Rate Route Frequency  Ordered Stop   11/21/14 0000  cefUROXime (ZINACEF) 1.5 g in dextrose 5 % 50 mL IVPB     1.5 g 100 mL/hr over 30 Minutes Intravenous Every 12 hours 11/20/14 2344 11/21/14 1221       V. Leia Alf, M.D. [facility] Office: [phone] Pager:  [phone]

## 2014-11-23 NOTE — Progress Notes (Signed)
ANTICOAGULATION CONSULT NOTE - Follow Up Consult  Pharmacy Consult for Heparin and Warfarin Indication: atrial fibrillation  Allergies  Allergen Reactions  . Aspirin Nausea And Vomiting    Patient Measurements: Height: 5\' 10"  (177.8 cm) Weight: 178 lb 12.7 oz (81.1 kg) IBW/kg (Calculated) : 73  Vital Signs: Temp: 98.1 F (36.7 C) (10/15 0525) Temp Source: Oral (10/15 0525) BP: 142/87 mmHg (10/15 1036) Pulse Rate: 62 (10/15 1036)  Labs:  Recent Labs  11/21/14 0307 11/21/14 1500 11/22/14 0359 11/22/14 1547 11/23/14 0349 11/23/14 1349  HGB 13.8  --  12.7*  --  12.8*  --   HCT 40.6  --  37.1*  --  37.4*  --   PLT 247  --  209  --  205  --   APTT  --  72* 122*  --   --   --   LABPROT  --   --  21.9*  --  21.7*  --   INR  --   --  1.93*  --  1.90*  --   HEPARINUNFRC 0.16*  --  0.16* 0.38 0.19* 0.40  CREATININE 0.94  --   --   --   --   --     Estimated Creatinine Clearance: 80.9 mL/min (by C-G formula based on Cr of 0.94).   Assessment: Maintained on fixed dose of 1000 units/hr on 10/13 by [facility], pharmacy started managing on 10/14, HL now 0.40, no issues per RN. INR still <2 this AM.  Pharmacy consult for warfarin ordered 10/15.  Goal of Therapy:  Heparin level 0.3-0.7 units/ml Monitor platelets by anticoagulation protocol: Yes   Plan:  -Give warfarin 7.5 mg x1 -Continue heparin at 1300 units/hr -Daily HL/CBC/INR -Continue heparin until INR > 2  Thank you Anette Guarneri, PharmD [phone]  11/23/2014 2:34 PM

## 2014-11-23 NOTE — Progress Notes (Signed)
ANTICOAGULATION CONSULT NOTE - Follow Up Consult  Pharmacy Consult for Heparin and warfarin Indication: atrial fibrillation  Allergies  Allergen Reactions  . Aspirin Nausea And Vomiting    Patient Measurements: Height: 5\' 10"  (177.8 cm) Weight: 178 lb 12.7 oz (81.1 kg) IBW/kg (Calculated) : 73  Vital Signs: Temp: 98.1 F (36.7 C) (10/15 0525) Temp Source: Oral (10/15 0525) BP: 124/83 mmHg (10/15 0525) Pulse Rate: 66 (10/15 0525)  Labs:  Recent Labs  11/20/14 1417  11/21/14 0307 11/21/14 1500 11/22/14 0359 11/22/14 1547 11/23/14 0349  HGB 15.3  --  13.8  --  12.7*  --  12.8*  HCT 45.3  --  40.6  --  37.1*  --  37.4*  PLT 246  --  247  --  209  --  205  APTT  --   --   --  72* 122*  --   --   LABPROT 18.1*  --   --   --  21.9*  --  21.7*  INR 1.49  --   --   --  1.93*  --  1.90*  HEPARINUNFRC  --   < > 0.16*  --  0.16* 0.38 0.19*  CREATININE 0.90  --  0.94  --   --   --   --   < > = values in this interval not displayed.  Estimated Creatinine Clearance: 80.9 mL/min (by C-G formula based on Cr of 0.94).   Assessment: Maintained on fixed dose of 1000 units/hr on 10/13 by [facility], pharmacy started managing on 10/14, HL is 0.19, no issues per RN. INR still <2 this AM.  Pharmacy consult for warfarin ordered 10/15.  Goal of Therapy:  Heparin level 0.3-0.7 units/ml Monitor platelets by anticoagulation protocol: Yes   Plan:  - Give warfarin 7.5 mg x1 -Continue heparin to 1300 units/hr -1400 HL - Daily HL/CBC/INR - Continue heparin until INR > 2  Joya San, PharmD Clinical Pharmacy Resident Pager # [phone] 11/23/2014 8:27 AM

## 2014-11-24 DIAGNOSIS — I63433 Cerebral infarction due to embolism of bilateral posterior cerebral arteries: Secondary | ICD-10-CM

## 2014-11-24 DIAGNOSIS — I639 Cerebral infarction, unspecified: Secondary | ICD-10-CM

## 2014-11-24 LAB — CBC
HEMATOCRIT: 36.8 % — AB (ref 39.0–52.0)
HEMOGLOBIN: 12.3 g/dL — AB (ref 13.0–17.0)
MCH: 31.3 pg (ref 26.0–34.0)
MCHC: 33.4 g/dL (ref 30.0–36.0)
MCV: 93.6 fL (ref 78.0–100.0)
Platelets: 245 10*3/uL (ref 150–400)
RBC: 3.93 MIL/uL — AB (ref 4.22–5.81)
RDW: 12.8 % (ref 11.5–15.5)
WBC: 6.5 10*3/uL (ref 4.0–10.5)

## 2014-11-24 LAB — PROTIME-INR
INR: 1.85 — AB (ref 0.00–1.49)
PROTHROMBIN TIME: 21.3 s — AB (ref 11.6–15.2)

## 2014-11-24 LAB — HEPARIN LEVEL (UNFRACTIONATED): HEPARIN UNFRACTIONATED: 0.37 [IU]/mL (ref 0.30–0.70)

## 2014-11-24 MED ORDER — WARFARIN - PHARMACIST DOSING INPATIENT
Freq: Every day | Status: DC
  Administered 2014-11-24: 1

## 2014-11-24 MED ORDER — WARFARIN SODIUM 10 MG PO TABS
10.0000 mg | ORAL_TABLET | Freq: Once | ORAL | Status: AC
  Administered 2014-11-24: 10 mg via ORAL
  Filled 2014-11-24: qty 1

## 2014-11-24 NOTE — Consult Note (Signed)
Admission H&P    Chief Complaint: Blurred vision left side.  HPI: [name] is an 64 y.o. male with a history of atrial fibrillation, hypertension, hyperlipidemia, COPD, amaurosis fugax and peripheral vascular disease, admitted on 11/20/2014 for acute occlusion of left axillobrachial bypass. He is status post extensive left brachial endarterectomy and angioplasty, also performed on 11/20/2014. Patient noticed blurring of vision on the left side on 01/21/2015. He has a history of amaurosis fugax involving his right eye which resolved. MRI of his brain showed multiple areas of acute infarct involving the cerebellum bilaterally as well as right temporal lobe and occipital lobes bilaterally consistent with embolic phenomena. Patient has no previous history of stroke. He has been on Coumadin for atrial fibrillation and INR on admission was 1.45. INR today was 1.85. NIH stroke score at the time of this evaluation was 0.  LSN: Unclear tPA Given: No: Unclear time of onset of embolic strokes mRankin:  Past Medical History  Diagnosis Date  . Atrial fibrillation ([place])   . Hypertension   . Hyperlipidemia   . COPD (chronic obstructive pulmonary disease) ([place])   . Amaurosis fugax of left eye     history of   . Hx of echocardiogram     Echo (07/09/13):  Mild LVH, EF 55-60%, MAC, mild MR, severe LAE, mod RAE  . Renal insufficiency     Past Surgical History  Procedure Laterality Date  . Embolectomy Left 04/05/2014    Procedure: THROMBO ENDARTERECTOMY OF LEFT BRACHIAL, RADIAL AND ULNAR ARTERY,  Left Radial artery cut down and radial artery thrombectomy.;  Surgeon: Elam Dutch, MD;  Location: [facility] OR;  Service: Vascular;  Laterality: Left;  . Patch angioplasty Left 04/05/2014    Procedure: LEFT ARM VEIN PATCH ANGIOPLASTY;  Surgeon: Elam Dutch, MD;  Location: [place];  Service: Vascular;  Laterality: Left;  . Arch aortogram N/A 04/05/2014    Procedure: ARCH AORTOGRAM, FIRST ORDER CATHETERIZATION  LEFT SUBCLAVIAN ARTERY;  Surgeon: Elam Dutch, MD;  Location: [place];  Service: Vascular;  Laterality: N/A;  . Axillary-femoral bypass graft Left 04/08/2014    Procedure: LEFT AXILLARY ARTERY TO BRACHIAL ARTERY BYPASS USING NON REVERSE LEFT GREATER SAPHENOUS VEIN ,LIGATION OF LEFT AXILLARY ARTERY ANEURYSM;  Surgeon: Elam Dutch, MD;  Location: [place];  Service: Vascular;  Laterality: Left;  . Vein harvest Left 04/08/2014    Procedure: LEFT GREATER SAPHENOUS VEIN HARVEST;  Surgeon: Elam Dutch, MD;  Location: [place];  Service: Vascular;  Laterality: Left;  . Thrombectomy brachial artery Left 11/20/2014    Procedure: 1.  Thrombectomy Left Axilo-Brachial Bypass  2.  Thromboendarterecotmy of Left Brachial Artery with Fogarty Thrombectomy of Radial and Ulnar Arteries with Dacron patch angioplasty Left Brachial Artery. 3. Intraoperative  Arteriogram times four.;  Surgeon: Mal Misty, MD;  Location: [facility] OR;  Service: Vascular;  Laterality: Left;    Family History  Problem Relation Age of Onset  . Hypertension Mother   . Cancer Mother   . Cancer Father     gastric cancer  . Cancer Sister   . Early death Neg Hx   . Heart disease Neg Hx   . Hyperlipidemia Neg Hx   . Kidney disease Neg Hx   . Stroke Neg Hx    Social History:  reports that he has been smoking Cigarettes.  He has a 90 pack-year smoking history. He has never used smokeless tobacco. He reports that he does not drink alcohol or use illicit  drugs.  Allergies:  Allergies  Allergen Reactions  . Aspirin Nausea And Vomiting    Medications Prior to Admission  Medication Sig Dispense Refill  . acetaminophen (TYLENOL) 500 MG tablet Take 1,000 mg by mouth 2 (two) times daily.    Marland Kitchen atorvastatin (LIPITOR) 40 MG tablet Take 1 tablet (40 mg total) by mouth daily. 90 tablet 3  . diltiazem (DILACOR XR) 240 MG 24 hr capsule Take 1 capsule (240 mg total) by mouth daily. 90 capsule 3  . metoprolol (LOPRESSOR) 100 MG tablet Take 1  tablet (100 mg total) by mouth 2 (two) times daily. 180 tablet 1  . oxyCODONE-acetaminophen (PERCOCET/ROXICET) 5-325 MG per tablet Take 1-2 tablets by mouth every 4 (four) hours as needed for moderate pain. 30 tablet 0  . sertraline (ZOLOFT) 50 MG tablet Take 1 tablet (50 mg total) by mouth at bedtime. (Patient taking differently: Take 50 mg by mouth at bedtime as needed. )    . warfarin (COUMADIN) 5 MG tablet Take as directed (Patient taking differently: Patient takes 5 mg (1 tab) daily except takes 2.5 mg (1/2 tab) on wednesdays) 90 tablet 3  . albuterol (PROVENTIL HFA;VENTOLIN HFA) 108 (90 BASE) MCG/ACT inhaler Inhale 2 puffs into the lungs every 6 (six) hours as needed for wheezing or shortness of breath.    . clonazePAM (KLONOPIN) 1 MG tablet Take 1 tablet (1 mg total) by mouth 3 (three) times daily. (Patient not taking: Reported on 11/20/2014) 75 tablet 2  . enoxaparin (LOVENOX) 80 MG/0.8ML injection Inject 0.7 mLs (70 mg total) into the skin every 12 (twelve) hours. Please inject 70 mg total every 12 hours at 6:00 pm and 6:00 am until you go to the coumadin clinic on 04/12/14. (Patient not taking: Reported on 08/08/2014) 14 Syringe 1  . gabapentin (NEURONTIN) 100 MG capsule Take 1 capsule (100 mg total) by mouth at bedtime. (Patient not taking: Reported on 11/20/2014)      ROS: History obtained from the patient  General ROS: negative for - chills, fatigue, fever, night sweats, weight gain or weight loss Psychological ROS: negative for - behavioral disorder, hallucinations, memory difficulties, mood swings or suicidal ideation Ophthalmic ROS: negative for - blurry vision, double vision, eye pain or loss of vision ENT ROS: negative for - epistaxis, nasal discharge, oral lesions, sore throat, tinnitus or vertigo Allergy and Immunology ROS: negative for - hives or itchy/watery eyes Hematological and Lymphatic ROS: negative for - bleeding problems, bruising or swollen lymph nodes Endocrine ROS:  negative for - galactorrhea, hair pattern changes, polydipsia/polyuria or temperature intolerance Respiratory ROS: negative for - cough, hemoptysis, shortness of breath or wheezing Cardiovascular ROS: negative for - chest pain, dyspnea on exertion, edema or irregular heartbeat Gastrointestinal ROS: negative for - abdominal pain, diarrhea, hematemesis, nausea/vomiting or stool incontinence Genito-Urinary ROS: negative for - dysuria, hematuria, incontinence or urinary frequency/urgency Musculoskeletal ROS: Weakness and numbness of left upper extremity as noted in history of present illness Neurological ROS: as noted in HPI Dermatological ROS: negative for rash and skin lesion changes  Physical Examination: Blood pressure 126/91, pulse 76, temperature 98.3 F (36.8 C), temperature source Oral, resp. rate 17, height 5\' 10"  (1.778 m), weight 81.1 kg (178 lb 12.7 oz), SpO2 97 %.  HEENT-  Normocephalic, no lesions, without obvious abnormality.  Normal external eye and conjunctiva.  Normal TM's bilaterally.  Normal auditory canals and external ears. Normal external nose, mucus membranes and septum.  Normal pharynx. Neck supple with no masses, nodes, nodules or  enlargement. Cardiovascular - regular rate and rhythm, S1, S2 normal, no murmur, click, rub or gallop Lungs - chest clear, no wheezing, rales, normal symmetric air entry Abdomen - soft, non-tender; bowel sounds normal; no masses,  no organomegaly Extremities - no joint deformities, effusion, or inflammation  Neurologic Examination: Mental Status: Alert, oriented, thought content appropriate.  Speech fluent without evidence of aphasia. Able to follow commands without difficulty. Cranial Nerves: II-Visual fields were normal. III/IV/VI-Pupils were equal and reacted normally to light. Extraocular movements were full and conjugate.    V/VII-no facial numbness and no facial weakness. VIII-normal. X-normal speech and symmetrical palatal  movement. XI: trapezius strength/neck flexion strength normal bilaterally XII-midline tongue extension with normal strength. Motor: 5/5 bilaterally with normal tone and bulk Sensory: Normal throughout. Deep Tendon Reflexes: 2+ and symmetric. Plantars: Mute bilaterally Cerebellar: Normal upper extremity coordination bilaterally. Carotid auscultation: Normal  Results for orders placed or performed during the hospital encounter of 11/20/14 (from the past 48 hour(s))  Heparin level (unfractionated)     Status: None   Collection Time: 11/22/14  3:47 PM  Result Value Ref Range   Heparin Unfractionated 0.38 0.30 - 0.70 IU/mL    Comment:        IF HEPARIN RESULTS ARE BELOW EXPECTED VALUES, AND PATIENT DOSAGE HAS BEEN CONFIRMED, SUGGEST FOLLOW UP TESTING OF ANTITHROMBIN III LEVELS.   CBC     Status: Abnormal   Collection Time: 11/23/14  3:49 AM  Result Value Ref Range   WBC 8.1 4.0 - 10.5 K/uL   RBC 3.98 (L) 4.22 - 5.81 MIL/uL   Hemoglobin 12.8 (L) 13.0 - 17.0 g/dL   HCT 37.4 (L) 39.0 - 52.0 %   MCV 94.0 78.0 - 100.0 fL   MCH 32.2 26.0 - 34.0 pg   MCHC 34.2 30.0 - 36.0 g/dL   RDW 13.0 11.5 - 15.5 %   Platelets 205 150 - 400 K/uL  Heparin level (unfractionated)     Status: Abnormal   Collection Time: 11/23/14  3:49 AM  Result Value Ref Range   Heparin Unfractionated 0.19 (L) 0.30 - 0.70 IU/mL    Comment:        IF HEPARIN RESULTS ARE BELOW EXPECTED VALUES, AND PATIENT DOSAGE HAS BEEN CONFIRMED, SUGGEST FOLLOW UP TESTING OF ANTITHROMBIN III LEVELS.   Protime-INR     Status: Abnormal   Collection Time: 11/23/14  3:49 AM  Result Value Ref Range   Prothrombin Time 21.7 (H) 11.6 - 15.2 seconds   INR 1.90 (H) 0.00 - 1.49  Heparin level (unfractionated)     Status: None   Collection Time: 11/23/14  1:49 PM  Result Value Ref Range   Heparin Unfractionated 0.40 0.30 - 0.70 IU/mL    Comment:        IF HEPARIN RESULTS ARE BELOW EXPECTED VALUES, AND PATIENT DOSAGE HAS BEEN  CONFIRMED, SUGGEST FOLLOW UP TESTING OF ANTITHROMBIN III LEVELS.   CBC     Status: Abnormal   Collection Time: 11/24/14  4:30 AM  Result Value Ref Range   WBC 6.5 4.0 - 10.5 K/uL   RBC 3.93 (L) 4.22 - 5.81 MIL/uL   Hemoglobin 12.3 (L) 13.0 - 17.0 g/dL   HCT 36.8 (L) 39.0 - 52.0 %   MCV 93.6 78.0 - 100.0 fL   MCH 31.3 26.0 - 34.0 pg   MCHC 33.4 30.0 - 36.0 g/dL   RDW 12.8 11.5 - 15.5 %   Platelets 245 150 - 400 K/uL  Heparin level (unfractionated)  Status: None   Collection Time: 11/24/14  4:30 AM  Result Value Ref Range   Heparin Unfractionated 0.37 0.30 - 0.70 IU/mL    Comment:        IF HEPARIN RESULTS ARE BELOW EXPECTED VALUES, AND PATIENT DOSAGE HAS BEEN CONFIRMED, SUGGEST FOLLOW UP TESTING OF ANTITHROMBIN III LEVELS.   Protime-INR     Status: Abnormal   Collection Time: 11/24/14  4:30 AM  Result Value Ref Range   Prothrombin Time 21.3 (H) 11.6 - 15.2 seconds   INR 1.85 (H) 0.00 - 1.49   Mr Brain Wo Contrast  11/23/2014  CLINICAL DATA:  Vision change. Postop day 3 thrombectomy left axillary-brachial bypass. Endarterectomy left brachial artery. EXAM: MRI HEAD WITHOUT CONTRAST TECHNIQUE: Multiplanar, multiecho pulse sequences of the brain and surrounding structures were obtained without intravenous contrast. COMPARISON:  MRI head 07/17/2013. FINDINGS: Multiple areas of acute infarct are present. There is acute infarct in the right lateral cerebellum. Small areas of acute infarct in the posterior cerebellum bilaterally. Acute infarct in the occipital lobes bilaterally right greater than left. Acute infarct right medial temporal lobe. Chronic microvascular ischemic changes in the white matter bilaterally. Chronic ischemia in the pons bilaterally. Ventricle size normal. Cerebral volume normal for age. No evidence of hemorrhage however gradient echo imaging inadvertently not performed to evaluate for hemorrhage. Paranasal sinuses clear.  Normal orbit. IMPRESSION: Multiple areas of  acute infarct involving the cerebellum bilaterally, right temporal lobe, and occipital lobes bilaterally. Findings most consistent with cerebral emboli. Chronic microvascular ischemic changes in the white matter and pons. Electronically Signed   By: Franchot Gallo M.D.   On: 11/23/2014 09:55    Assessment: 65 y.o. male with multiple risk factors for stroke with MRI findings consistent with acute embolic strokes involving posterior circulation bilaterally, including bilateral cerebellum and occipital lobes, as well as right temporal lobe.  Stroke Risk Factors - atrial fibrillation, hyperlipidemia and hypertension  Plan: 1. HgbA1c, fasting lipid panel 2. MRA  of the brain without contrast 3. PT consult 4. Echocardiogram 6. Prophylactic therapy-Anticoagulation: Coumadin 7. Risk factor modification 8. Telemetry monitoring  C.R. Nicole Kindred, MD [facility] [phone]  11/24/2014, 12:05 PM

## 2014-11-24 NOTE — Progress Notes (Signed)
Physical Therapy Treatment Patient Details Name: [name] MRN: [id number] DOB: Apr 13, 1950 Today's Date: 11/24/2014    History of Present Illness [name] is a 64 y.o. male who presents for evaluation of occlusion of left axillary-brachial bypass. Patient states that he developed discomfort and numbness in the left upper extremity. He has a history of 2 operative procedures by Dr. Juanda Crumble feels February 26 and February 29 with thrombectomy and BPG. S/p axillary brachial BPG 10/12    PT Comments    Pt was seen for progression of gait to longer trip, declined to walk on steps due to feeling "off".  Agreed to another attempt tomorrow and will continue with stairs as able due to his level of expectation to get home.  Follow Up Recommendations  Home health PT     Equipment Recommendations  None recommended by PT    Recommendations for Other Services OT consult     Precautions / Restrictions Precautions Precautions: Fall Precaution Comments: LUE swelling and pain Restrictions Weight Bearing Restrictions: No    Mobility  Bed Mobility Overal bed mobility: Modified Independent             General bed mobility comments: in and OOB with HOB up and use of rail on left side  Transfers Overall transfer level: Needs assistance Equipment used: 1 person hand held assist Transfers: Sit to/from [facility] Sit to Stand: Supervision Stand pivot transfers: Min guard          Ambulation/Gait Ambulation/Gait assistance: Supervision;Min guard Ambulation Distance (Feet): 250 Feet Assistive device: None Gait Pattern/deviations: Step-through pattern;Trunk flexed;Wide base of support;Decreased stride length Gait velocity: normal to slow Gait velocity interpretation: at or above normal speed for age/gender General Gait Details: postural correction   Stairs Stairs:  (declined)          Wheelchair Mobility    Modified Rankin (Stroke Patients  Only)       Balance Overall balance assessment: Needs assistance;Modified Independent Sitting-balance support: Feet supported Sitting balance-Leahy Scale: Normal   Postural control: Posterior lean Standing balance support: Bilateral upper extremity supported;Single extremity supported (IV pole vs walker) Standing balance-Leahy Scale: Fair                      Cognition Arousal/Alertness: Awake/alert Behavior During Therapy: WFL for tasks assessed/performed Overall Cognitive Status: Within Functional Limits for tasks assessed                      Exercises      General Comments General comments (skin integrity, edema, etc.): Pt uses two devices, and noted the walker was still painful to L arm.  Did have a slightly subtherapeutic INR, safe for visit but may be impacting his comfort.  IV pole with RUE was fine and did move at comfortable pace, O2 sat 98% post gait       Pertinent Vitals/Pain Pain Assessment: Faces Faces Pain Scale: Hurts little more Pain Location: L arm edema and redness Pain Descriptors / Indicators: Operative site guarding Pain Intervention(s): Limited activity within patient's tolerance;Premedicated before session    Home Living                      Prior Function            PT Goals (current goals can now be found in the care plan section) Acute Rehab PT Goals Patient Stated Goal: Get home  Progress towards PT goals: Progressing  toward goals    Frequency  Min 3X/week    PT Plan Current plan remains appropriate    Co-evaluation             End of Session Equipment Utilized During Treatment: Gait belt Activity Tolerance: Patient tolerated treatment well Patient left: in bed;with call bell/phone within reach;with bed alarm set     Time: 0950-1016 PT Time Calculation (min) (ACUTE ONLY): 26 min  Charges:  $Gait Training: 8-22 mins $Therapeutic Activity: 8-22 mins                    G Codes:      [name] 12-22-14, 1:04 PM   Mee Hives, PT MS Acute Rehab Dept. Number: [facility] [phone] and [place] [phone]

## 2014-11-24 NOTE — Progress Notes (Signed)
Utilization review completed.  

## 2014-11-24 NOTE — Progress Notes (Signed)
  Progress Note    11/24/2014 7:37 AM 4 Days Post-Op  Subjective:  C/o pain in his left arm  Afebrile HR 60's-80's Afib 889'V-694'H systolic 03% RA  Filed Vitals:   11/24/14 0333  BP: 137/86  Pulse: 70  Temp: 98.3 F (36.8 C)  Resp: 17    Physical Exam: Cardiac:  irregular Lungs:  Non labored Incisions:  Left axillary and forearm incisions are clean and dry with ecchymosis Extremities:  + doppler signals left radial/ulnar   CBC    Component Value Date/Time   WBC 6.5 11/24/2014 0430   RBC 3.93* 11/24/2014 0430   HGB 12.3* 11/24/2014 0430   HCT 36.8* 11/24/2014 0430   PLT 245 11/24/2014 0430   MCV 93.6 11/24/2014 0430   MCH 31.3 11/24/2014 0430   MCHC 33.4 11/24/2014 0430   RDW 12.8 11/24/2014 0430   LYMPHSABS 2.7 11/20/2014 1417   MONOABS 0.8 11/20/2014 1417   EOSABS 0.1 11/20/2014 1417   BASOSABS 0.0 11/20/2014 1417    BMET    Component Value Date/Time   NA 133* 11/21/2014 0307   K 3.7 11/21/2014 0307   CL 100* 11/21/2014 0307   CO2 24 11/21/2014 0307   GLUCOSE 122* 11/21/2014 0307   BUN 7 11/21/2014 0307   CREATININE 0.94 11/21/2014 0307   CREATININE 1.23 08/07/2014 0913   CALCIUM 8.4* 11/21/2014 0307   GFRNONAA >60 11/21/2014 0307   GFRNONAA >89 03/14/2014 1338   GFRAA >60 11/21/2014 0307   GFRAA >89 03/14/2014 1338    INR    Component Value Date/Time   INR 1.85* 11/24/2014 0430   INR 2.1 10/15/2014 1159   INR 2.7 04/02/2010 1021     Intake/Output Summary (Last 24 hours) at 11/24/14 0737 Last data filed at 11/23/14 1519  Gross per 24 hour  Intake    480 ml  Output      0 ml  Net    480 ml     Assessment:  64 y.o. male is s/p:  1. Thrombectomy Left Axilo-Brachial Bypass  2. Thromboendarterecotmy of Left Brachial Artery with Fogarty Thrombectomy of Radial and Ulnar Arteries with Dacron patch angioplasty Left Brachial Artery.  3. Intraoperative Arteriogram times four.  4 Days Post-Op   Plan: -Pt continues to have audible  doppler signals left radial and ulnar -motor and sensation is in tact -pt continues to have left eye disturbance-MRI positive for acute infarct of the  Cerebellum bilaterally, right temporal lobe and occipital lobes bilaterally.  Findings most consistent with cerebral emboli -chronic microvascular ischemic changes in the white matter and pons.  Carotid dopplers 1-39%  -decrease in INR this morning-continue heparin/coumadin per pharmacy -DVT prophylaxis:  Heparin/coumadin   Leontine Locket, PA-C [facility] [phone] 11/24/2014 7:37 AM    I agree with the above.  I have seen and examined the patient.  His MRI shows acute bilateral cerebellar stroke.  Neuro has been consulted.  His INR went down today.  COntinue IV heparin and coumadin.  Carotid dopplers were 1-39% bilaterally.   Annamarie Major

## 2014-11-24 NOTE — Progress Notes (Signed)
ANTICOAGULATION CONSULT NOTE - Follow Up Consult  Pharmacy Consult for Heparin and Warfarin Indication: atrial fibrillation  Allergies  Allergen Reactions  . Aspirin Nausea And Vomiting    Patient Measurements: Height: 5\' 10"  (177.8 cm) Weight: 178 lb 12.7 oz (81.1 kg) IBW/kg (Calculated) : 73  Vital Signs: Temp: 98.3 F (36.8 C) (10/16 0333) Temp Source: Oral (10/16 0333) BP: 126/91 mmHg (10/16 1008) Pulse Rate: 76 (10/16 1008)  Labs:  Recent Labs  11/21/14 1500  11/22/14 0359  11/23/14 0349 11/23/14 1349 11/24/14 0430  HGB  --   < > 12.7*  --  12.8*  --  12.3*  HCT  --   --  37.1*  --  37.4*  --  36.8*  PLT  --   --  209  --  205  --  245  APTT 72*  --  122*  --   --   --   --   LABPROT  --   --  21.9*  --  21.7*  --  21.3*  INR  --   --  1.93*  --  1.90*  --  1.85*  HEPARINUNFRC  --   --  0.16*  < > 0.19* 0.40 0.37  < > = values in this interval not displayed.  Estimated Creatinine Clearance: 80.9 mL/min (by C-G formula based on Cr of 0.94).   Assessment: 64 year old male continues on heparin and Coumadin for Afib, s/p thrombectomies 10/12 MRI positive for cerebral emboli this admission, workup underway  Home dose of Coumadin - 2.5 on Wednesdays, 5 mg other days INR today = 1.85 (trended down despite higher dose than home dose), HL therapeutic, CBC stable  Goal of Therapy:  Heparin level 0.3-0.7 units/ml Monitor platelets by anticoagulation protocol: Yes   Plan:  -Give warfarin 10 mg x1 -Continue heparin at 1300 units/hr -Daily HL/CBC/INR -Continue heparin until INR > 2  Thank you Anette Guarneri, PharmD [phone]  11/24/2014 12:51 PM

## 2014-11-25 ENCOUNTER — Inpatient Hospital Stay (HOSPITAL_COMMUNITY): Payer: Medicare Other

## 2014-11-25 DIAGNOSIS — I6789 Other cerebrovascular disease: Secondary | ICD-10-CM

## 2014-11-25 DIAGNOSIS — H53451 Other localized visual field defect, right eye: Secondary | ICD-10-CM

## 2014-11-25 HISTORY — PX: TRANSTHORACIC ECHOCARDIOGRAM: SHX275

## 2014-11-25 LAB — CBC
HCT: 37.4 % — ABNORMAL LOW (ref 39.0–52.0)
Hemoglobin: 12.4 g/dL — ABNORMAL LOW (ref 13.0–17.0)
MCH: 31.1 pg (ref 26.0–34.0)
MCHC: 33.2 g/dL (ref 30.0–36.0)
MCV: 93.7 fL (ref 78.0–100.0)
PLATELETS: 272 10*3/uL (ref 150–400)
RBC: 3.99 MIL/uL — ABNORMAL LOW (ref 4.22–5.81)
RDW: 12.7 % (ref 11.5–15.5)
WBC: 6.7 10*3/uL (ref 4.0–10.5)

## 2014-11-25 LAB — LIPID PANEL
CHOL/HDL RATIO: 3.5 ratio
CHOLESTEROL: 143 mg/dL (ref 0–200)
HDL: 41 mg/dL (ref >40)
LDL Cholesterol: 82 mg/dL (ref 0–99)
TRIGLYCERIDES: 101 mg/dL (ref <150)
VLDL: 20 mg/dL (ref 0–40)

## 2014-11-25 LAB — HEPARIN LEVEL (UNFRACTIONATED): Heparin Unfractionated: 0.46 IU/mL (ref 0.30–0.70)

## 2014-11-25 LAB — PROTIME-INR
INR: 1.88 — AB (ref 0.00–1.49)
PROTHROMBIN TIME: 21.5 s — AB (ref 11.6–15.2)

## 2014-11-25 MED ORDER — OXYCODONE-ACETAMINOPHEN 5-325 MG PO TABS: 1.0000 | ORAL_TABLET | ORAL | Status: DC | PRN

## 2014-11-25 MED ORDER — LORAZEPAM 1 MG PO TABS
1.0000 mg | ORAL_TABLET | Freq: Two times a day (BID) | ORAL | Status: AC
  Administered 2014-11-25: 1 mg via ORAL
  Filled 2014-11-25: qty 1

## 2014-11-25 MED ORDER — WARFARIN SODIUM 10 MG PO TABS: 10.0000 mg | ORAL_TABLET | Freq: Once | ORAL | Status: DC

## 2014-11-25 MED ORDER — WARFARIN SODIUM 5 MG PO TABS: ORAL_TABLET | ORAL | Status: DC

## 2014-11-25 MED ORDER — WARFARIN SODIUM 7.5 MG PO TABS
7.5000 mg | ORAL_TABLET | Freq: Once | ORAL | Status: AC
  Administered 2014-11-25: 7.5 mg via ORAL
  Filled 2014-11-25: qty 1

## 2014-11-25 MED ORDER — LORAZEPAM 1 MG PO TABS: 1.0000 mg | ORAL_TABLET | Freq: Two times a day (BID) | ORAL | Status: DC

## 2014-11-25 NOTE — Progress Notes (Addendum)
Occupational Therapy Treatment Patient Details Name: TAMER BAUGHMAN MRN: [id number] DOB: January 07, 1951 Today's Date: 11/25/2014    History of present illness COLLEN VINCENT is a 64 y.o. male who presents for evaluation of occlusion of left axillary-brachial bypass. Patient states that he developed discomfort and numbness in the left upper extremity. He has a history of 2 operative procedures by Dr. Juanda Crumble feels February 26 and February 29 with thrombectomy and BPG. S/p axillary brachial BPG 10/12. MRI on 10/17 revealed Multiple areas of acute infarct involving the cerebellum bilaterally, right temporal lobe, and occipital lobes bilaterally.   OT comments  Continue to recommend [place] upon d/c. Recommended pt ask MD about driving.  Follow Up Recommendations  Home health OT;Supervision - Intermittent    Equipment Recommendations  None recommended by OT    Recommendations for Other Services      Precautions / Restrictions Precautions Precautions: Fall Precaution Comments: LUE swelling and pain Restrictions Weight Bearing Restrictions: No       Mobility Bed Mobility Overal bed mobility: Needs Assistance Bed Mobility: Supine to Sit;Sit to Supine     Supine to sit: Supervision Sit to supine: Supervision      Transfers Overall transfer level: Needs assistance   Transfers: Sit to/from Stand Sit to Stand: Supervision              Balance Overall balance assessment:  (reports falling)    Unsteady at times when standing.                             ADL Overall ADL's : Needs assistance/impaired     Grooming: Wash/dry face;Oral care;Brushing hair;Supervision/safety;Set up;Standing   Upper Body Bathing: Set up;Supervision/ safety;Standing   Lower Body Bathing: Set up;Sitting/lateral leans (washed feet)     Upper Body Dressing Details (indicate cue type and reason): OT assisted with tying/untying gown; pt able to feed arms through Lower Body Dressing:  Supervision/safety;Set up Lower Body Dressing Details (indicate cue type and reason): donned shoes and unbuckled belt Toilet Transfer: Min guard;Ambulation (sit to stand from bed)       Tub/ Shower Transfer: Walk-in shower;Ambulation;Min guard   Functional mobility during ADLs: Min guard (Min guard-Supervision; pushed IV pole part of time); ambulated some in hallway General ADL Comments: Educated on safety such as sitting for LB ADLs, rugs on floor, and safe footwear. Instructed pt to elevate LUE.      Vision   Pt wears glasses. Pt reports blurry vision as well as spots missing in his vision, but reports this has been going on for years (comes and goes).   Visual field test: No apparent deficits                   Perception     Praxis      Cognition  Awake/Alert Behavior During Therapy: WFL for tasks assessed/performed Overall Cognitive Status: Within Functional Limits for tasks assessed                       Extremity/Trunk Assessment               Exercises Other Exercises Other Exercises: educated on LUE exercises-moving shoulder and elbow   Shoulder Instructions       General Comments      Pertinent Vitals/ Pain       Pain Assessment: 0-10 Pain Score: 6  Pain Location: LUE  Pain Descriptors / Indicators:  Throbbing Pain Intervention(s): Monitored during session;Repositioned  Home Living                                          Prior Functioning/Environment              Frequency Min 2X/week     Progress Toward Goals  OT Goals(current goals can now be found in the care plan section)  Progress towards OT goals: Not progressing toward goals  Acute Rehab OT Goals Patient Stated Goal: go home today OT Goal Formulation: With patient Time For Goal Achievement: 11/28/14 Potential to Achieve Goals: Good ADL Goals Pt Will Perform Upper Body Dressing: with modified independence;sitting;standing Pt Will Perform  Lower Body Dressing: with modified independence;sit to/from stand Pt Will Transfer to Toilet: with modified independence;ambulating;regular height toilet Pt Will Perform Toileting - Clothing Manipulation and hygiene: with modified independence;sit to/from stand Pt/caregiver will Perform Home Exercise Program: Increased ROM;Left upper extremity;Independently;With written HEP provided  Plan Discharge plan remains appropriate    Co-evaluation                 End of Session Equipment Utilized During Treatment: Gait belt   Activity Tolerance Patient tolerated treatment well   Patient Left in bed;with call bell/phone within reach;with bed alarm set   Nurse Communication Other (comment) (bathed some in session; pt trying to take off leads to bathe; would be good to place more pillows under LUE)        Time: 1348-1416 OT Time Calculation (min): 28 min  Charges: OT General Charges $OT Visit: 1 Procedure OT Treatments $Self Care/Home Management : 8-22 mins $Therapeutic Activity: 8-22 mins  Benito Mccreedy OTR/L [phone] 11/25/2014, 2:30 PM

## 2014-11-25 NOTE — Progress Notes (Signed)
STROKE TEAM PROGRESS NOTE   HISTORY [name] is an 64 y.o. male with a history of atrial fibrillation, hypertension, hyperlipidemia, COPD, amaurosis fugax and peripheral vascular disease, admitted on 11/20/2014 for acute occlusion of left axillobrachial bypass. He is status post extensive left brachial endarterectomy and angioplasty, also performed on 11/20/2014. Patient noticed blurring of vision on the left side on 01/21/2015. He has a history of amaurosis fugax involving his right eye which resolved. MRI of his brain showed multiple areas of acute infarct involving the cerebellum bilaterally as well as right temporal lobe and occipital lobes bilaterally consistent with embolic phenomena. Patient has no previous history of stroke. He has been on Coumadin for atrial fibrillation and INR on admission was 1.45. INR today was 1.85. NIH stroke score at the time of this evaluation was 0. LKW: Unclear tPA Given: No. Patient was not administered TPA secondary to unclear time of onset of embolic strokes.   SUBJECTIVE (INTERVAL HISTORY) His RN is at the bedside, no family present.  Overall he feels his condition is stable. He still has blurry vision. Pt reports central vision loss in his R eye.   OBJECTIVE Temp:  [97.8 F (36.6 C)-98 F (36.7 C)] 97.8 F (36.6 C) (10/17 0440) Pulse Rate:  [64-72] 72 (10/17 0440) Cardiac Rhythm:  [-] Atrial fibrillation (10/17 0717) Resp:  [17-18] 18 (10/17 0440) BP: (101-148)/(70-96) 101/70 mmHg (10/17 0440) SpO2:  [96 %-100 %] 96 % (10/17 0440)  CBC:  Recent Labs Lab 11/20/14 1417  11/24/14 0430 11/25/14 0306  WBC 10.7*  < > 6.5 6.7  NEUTROABS 7.1  --   --   --   HGB 15.3  < > 12.3* 12.4*  HCT 45.3  < > 36.8* 37.4*  MCV 93.2  < > 93.6 93.7  PLT 246  < > 245 272  < > = values in this interval not displayed.  Basic Metabolic Panel:   Recent Labs Lab 11/20/14 1417 11/21/14 0307  NA 136 133*  K 3.6 3.7  CL 102 100*  CO2 24 24  GLUCOSE 105*  122*  BUN 10 7  CREATININE 0.90 0.94  CALCIUM 9.3 8.4*    Lipid Panel:     Component Value Date/Time   CHOL 208* 12/13/2012 1514   TRIG 120.0 12/13/2012 1514   HDL 39.90 12/13/2012 1514   CHOLHDL 5 12/13/2012 1514   VLDL 24.0 12/13/2012 1514   LDLCALC 136* 04/15/2010 1057   HgbA1c:  Lab Results  Component Value Date   HGBA1C  05/26/2007    5.5 (NOTE)   The ADA recommends the following therapeutic goals for glycemic   control related to Hgb A1C measurement:   Goal of Therapy:   < 7.0% Hgb A1C   Action Suggested:  > 8.0% Hgb A1C   Ref:  Diabetes Care, 22, Suppl. 1, 1999   Urine Drug Screen: No results found for: LABOPIA, COCAINSCRNUR, LABBENZ, AMPHETMU, THCU, LABBARB    IMAGING  Ct Angio Up Extrem Left W/cm &/or Wo/cm 11/20/2014  1. Origin occlusion of the left axillary to brachial artery graft. 2. Progression of pulmonary nodules in the left upper lobe. Recommend CT chest without contrast when the patient is stable for more complete evaluation.   Mr Brain Wo Contrast 11/23/2014  Multiple areas of acute infarct involving the cerebellum bilaterally, right temporal lobe, and occipital lobes bilaterally. Findings most consistent with cerebral emboli. Chronic microvascular ischemic changes in the white matter and pons.    Carotid Doppler  Bilateral: mild calcific plaque CCA. Mild mixed plaque origin ICA. 1-39% ICA stenosis. Vertebral artery flow is antegrade.   PHYSICAL EXAM Pleasant middle-aged Caucasian male currently not in distress. . Afebrile. Head is nontraumatic. Neck is supple without bruit.    Cardiac exam no murmur or gallop. Lungs are clear to auscultation. Distal pulses are well felt. Neurological Exam :  Awake  Alert oriented x 3. Normal speech and language.eye movements full without nystagmus.fundi were not visualized. Vision acuity and fields appear normal. Hearing is normal. Palatal movements are normal. Face symmetric. Tongue midline. Normal strength, tone,  reflexes and coordination. Normal sensation. Gait deferred. ASSESSMENT/PLAN [name] is a 64 y.o. male with history of atrial fibrillation on coumadin, hypertension, hyperlipidemia, COPD, amaurosis fugax and peripheral vascular disease, admitted on 11/20/2014 for acute occlusion of left axillobrachial bypass, s/p left brachial endarterectomy and angioplasty. Developed blurring of vision on the left side on 01/21/2015. He did not receive IV t-PA due to unknown onset, on coumadin.   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 INR 1.88. Would like it to be therapeutic prior to discharge  Patient counseled to be compliant with his antithrombotic medications  Ongoing aggressive stroke risk factor management  Therapy recommendations:  Home health PT  Disposition:  Home with home health PT  Atrial Fibrillation  Home anticoagulation:  coumadin continued post op in the hospital. On IV heparin bridge  INR 1.88  Prefer pt be therapeutic prior to discharge   Hypertension  Stable  Hyperlipidemia  Home meds:  lipitor 40, resumed in hospital  LDL pending, goal < 70  Continue statin at discharge  Other Stroke Risk Factors  Advanced age  Cigarette smoker, advised to stop smoking  Hx stroke/TIA - amaurosis fugax   Other Active Problems  acute occlusion of left axillobrachial bypass status post extensive left brachial endarterectomy and angioplasty 11/20/2014  Hospital day #  [facility] See Amion for Pager information 11/25/2014 11:35 AM  I have personally examined this patient, reviewed notes, independently viewed imaging studies, participated in medical decision making and plan of care. I have made any additions or clarifications directly to the above note. Agree with note above. Patient has had vision disturbances following his left brachial endarterectomy procedure and has atrial fibrillation and was subtherapeutic on Coumadin. MRI shows multiple embolic strokes etiology unclear related to atrial fibrillation or complications of brachial endarterectomy procedure. He remains at risk for recurrent stroke, TIA, neurological worsening and needs ongoing stroke evaluation. Continue IV heparin till INR is above 2 then just discharged home on warfarin  Antony Contras, MD Medical Director [facility] Pager: [phone] 11/25/2014 4:28 PM    To contact Stroke Continuity provider, please refer to [email or web address] After hours, contact General Neurology

## 2014-11-25 NOTE — Progress Notes (Addendum)
Spoke with Dr. Nicole Kindred.  He states that he recommended MRA and echo before the pt goes home.  The pt is now on the stroke service.  These test were not ordered yesterday, so I have ordered them for today.    Will plan on d/c later today after these test have been performed if okay with the stroke service.   He will receive coumadin 7.5mg  today and then 5mg  daily after that with a follow up appt at the coumadin clinic on Thursday, November 28, 2014 at 1215.  Would appreciate if neuro could advise on follow up.  Thank you!   [name] 11/25/2014 10:15 AM  Addendum:   Stroke team feels pt should be therapeutic prior to discharge.  He should be therapeutic tomorrow.  Coumadin tonight per pharmacy and INR check in the am.    [name], [facility] [place] 11/25/2014 11:48 AM

## 2014-11-25 NOTE — Progress Notes (Addendum)
PT Cancellation Note  Patient Details Name: [name] MRN: [id number] DOB: 1950-06-02   Cancelled Treatment:    Reason Eval/Treat Not Completed: Patient at procedure or test/unavailable Off floor at Ultrasound. Will follow up next available time. Attempted x2 and pt declined reporting he had been up all day and wanting tor est.   Marguarite Arbour A Raysean Graumann 11/25/2014, 3:57 PM [name], [place], DPT [phone]

## 2014-11-25 NOTE — Progress Notes (Signed)
Echocardiogram 2D Echocardiogram has been performed.  Tresa Res 11/25/2014, 4:27 PM

## 2014-11-25 NOTE — Care Management Important Message (Signed)
Important Message  Patient Details  Name: [name] MRN: [id number] Date of Birth: 19-Dec-1950   Medicare Important Message Given:  Yes-second notification given    Nathen May 11/25/2014, 11:47 AM

## 2014-11-25 NOTE — Progress Notes (Addendum)
Progress Note    11/25/2014 7:38 AM 5 Days Post-Op  Subjective:  Feels better today.  Glad his arm is better.  Vision is unchanged.  Afebrile HR 60's-70's Afib 893'Y-101'B systolic 51% RA  Filed Vitals:   11/25/14 0440  BP: 101/70  Pulse: 72  Temp: 97.8 F (36.6 C)  Resp: 18    Physical Exam: Cardiac:  irregular Lungs:  Non labored Incisions:  Healing nicely Extremities:  Brisk left ulnar and radial doppler signals; motor and sensation are in tact.   CBC    Component Value Date/Time   WBC 6.7 11/25/2014 0306   RBC 3.99* 11/25/2014 0306   HGB 12.4* 11/25/2014 0306   HCT 37.4* 11/25/2014 0306   PLT 272 11/25/2014 0306   MCV 93.7 11/25/2014 0306   MCH 31.1 11/25/2014 0306   MCHC 33.2 11/25/2014 0306   RDW 12.7 11/25/2014 0306   LYMPHSABS 2.7 11/20/2014 1417   MONOABS 0.8 11/20/2014 1417   EOSABS 0.1 11/20/2014 1417   BASOSABS 0.0 11/20/2014 1417    BMET    Component Value Date/Time   NA 133* 11/21/2014 0307   K 3.7 11/21/2014 0307   CL 100* 11/21/2014 0307   CO2 24 11/21/2014 0307   GLUCOSE 122* 11/21/2014 0307   BUN 7 11/21/2014 0307   CREATININE 0.94 11/21/2014 0307   CREATININE 1.23 08/07/2014 0913   CALCIUM 8.4* 11/21/2014 0307   GFRNONAA >60 11/21/2014 0307   GFRNONAA >89 03/14/2014 1338   GFRAA >60 11/21/2014 0307   GFRAA >89 03/14/2014 1338    INR    Component Value Date/Time   INR 1.88* 11/25/2014 0306   INR 2.1 10/15/2014 1159   INR 2.7 04/02/2010 1021     Intake/Output Summary (Last 24 hours) at 11/25/14 0738 Last data filed at 11/25/14 0440  Gross per 24 hour  Intake    480 ml  Output      4 ml  Net    476 ml     Assessment:  64 y.o. male is s/p:  1. Thrombectomy Left Axilo-Brachial Bypass  2. Thromboendarterecotmy of Left Brachial Artery with Fogarty Thrombectomy of Radial and Ulnar Arteries with Dacron patch angioplasty Left Brachial Artery.  3. Intraoperative Arteriogram times four.  5 Days  Post-Op   Plan: -pt continues to have brisk left radial and ulnar doppler signals -pt is moving arm a little better today and not as swollen. -INR is still not therapeutic - continue heparin/coumadin per pharmacy -pt's vision is unchanged-appreciate neurology's input.   -awaiting PT/OT consults -DVT prophylaxis:  Heparin/Lovenox   Leontine Locket, PA-C [facility] [phone] 11/25/2014 7:38 AM L left hand well perfused --good flow and left radial and ulnar arteries Left upper extremities incisions healing nicely Patient states today that his vision has not changed significantly from his preoperative state that he is a poor historian. Denies any change in instability from preop state  INR today is 1.9. Patient would like to be discharged today if possible. Patient has had MRI of the brain. He does have history of atrial fibrillation. Heparin level today 0.46.  We'll check with neurology but I think patient could probably be discharged today. We'll give 7.5 mg of Coumadin today and discharge on 5 mg daily Patient to follow up in Coumadin clinic on Thursday to adjust Coumadin dose if necessary  Unsure of etiology of posterior cerebellar stroke but thrombectomy of left subclavian axillary artery area was performed during surgery although there was no thrombus in the  proximal subclavian artery on CT angiogram. It is possible the patient could've showered some debris up left vertebral artery but this does not explain right temporal stroke Will  leave decision of discharged today to neurology since they have recommended 2-D echocardiogram and MRA.

## 2014-11-25 NOTE — Progress Notes (Addendum)
ANTICOAGULATION CONSULT NOTE - Follow Up Consult  Pharmacy Consult for Heparin and Warfarin Indication: atrial fibrillation  Allergies  Allergen Reactions  . Aspirin Nausea And Vomiting    Patient Measurements: Height: 5\' 10"  (177.8 cm) Weight: 178 lb 12.7 oz (81.1 kg) IBW/kg (Calculated) : 73  Vital Signs: Temp: 97.8 F (36.6 C) (10/17 0440) Temp Source: Oral (10/17 0440) BP: 101/70 mmHg (10/17 0440) Pulse Rate: 72 (10/17 0440)  Labs:  Recent Labs  11/23/14 0349 11/23/14 1349 11/24/14 0430 11/25/14 0306  HGB 12.8*  --  12.3* 12.4*  HCT 37.4*  --  36.8* 37.4*  PLT 205  --  245 272  LABPROT 21.7*  --  21.3* 21.5*  INR 1.90*  --  1.85* 1.88*  HEPARINUNFRC 0.19* 0.40 0.37 0.46    Estimated Creatinine Clearance: 80.9 mL/min (by C-G formula based on Cr of 0.94).   Assessment: 64 year old male continues on heparin and Coumadin for Afib, s/p thrombectomies 10/12 MRI positive for cerebral emboli this admission, workup underway  Home dose of Coumadin - 2.5 on Wednesdays, 5 mg other days  Heparin level continues to be at goal on 1300 units/hr INR today = 1.88 (up slightly from yesterday), CBC stable.   D/w warfarin dosing with [facility] PA, will give slightly higher dose today (7.5mg ) and continue with 5mg  daily until INR rechecked on Thursday at coumadin clinic.  Goal of Therapy:  INR goal 2-3 Heparin level 0.3-0.7 units/ml Monitor platelets by anticoagulation protocol: Yes   Plan:  D/c home today Warfarin 7.5mg  today then 5mg  for next two days  Thank you,  Erin Hearing PharmD., BCPS Clinical Pharmacist Pager [phone] 11/25/2014 8:33 AM

## 2014-11-25 NOTE — Discharge Summary (Signed)
Discharge Summary    [name] 1950/12/10 64 y.o. male  [id number]  Admission Date: 11/20/2014  Discharge Date: 11/26/14  Physician: Mal Misty, MD  Admission Diagnosis: Left arm pain [M79.602]   HPI:   This is a 64 y.o. male who presents for evaluation of occlusion of left axillary-brachial bypass. Patient states that he developed discomfort and numbness in the left upper extremity 3 days ago in the early morning. Patient has a history of 2 operative procedures by Dr. Juanda Crumble feels February 26 and February 29. Initially he had exploration of the left brachial artery after acute ischemia occurred. He had thromboendarterectomy of the brachial artery with thrombectomy with less than ideal flow being reestablished. Patient returned to the operating room and had saphenous vein graft from the axillary artery to the brachial artery with satisfactory result. He also had ligation of an axillary artery aneurysm. Patient was seen by Dr. Oneida Alar 08/08/2014 with 2+ pulse in the wrist and well-perfused left hand. Patient has continued to smoke at least 1 pack of cigarettes per day even since this procedure. Patient is on chronic anticoagulation-warfarin for atrial fibrillation but INR today is 1.49. Patient does not have severe pain in the left hand present time but does have numbness and tingling and weakness.  Hospital Course:  The patient was admitted to the hospital and taken to the operating room on 11/20/2014 and underwent: 1. Thrombectomy Left Axilo-Brachial Bypass 2. Thromboendarterecotmy of Left Brachial Artery with Fogarty Thrombectomy of Radial and Ulnar Arteries with Dacron patch angioplasty Left Brachial Artery. 3. Intraoperative Arteriogram times four    The pt tolerated the procedure well and was transported to the PACU in good condition.   The pt did have visual changes post operatively, which he described were similar to his past stroke.  A neurology consult was  obtained and an MRI was obtained, which revealed acute infarct of the Cerebellum bilaterally, right temporal lobe and occipital lobes bilaterally. Findings most consistent with cerebral emboli.  Carotid dopplers were obtained and were 1-39% bilaterally.    MRA of the head on 11/25/14 revealed the following: MRA head 11/25/14: IMPRESSION: Suspected acute thrombosis distal LEFT vertebral with absent flow related enhancement on intracranial MRA. Unclear if the observed pattern of infarctions result from emboli from the LEFT vertebral vessel or from atrial fibrillation.  2D echo on 11/25/14 revealed the following: Study Conclusions  - Left ventricle: The cavity size was normal. There was moderate concentric hypertrophy. Systolic function was normal. The estimated ejection fraction was in the range of 60% to 65%. Wall motion was normal; there were no regional wall motion abnormalities. The study was not technically sufficient to allow evaluation of LV diastolic dysfunction due to atrial fibrillation. - Mitral valve: There was mild regurgitation. - Left atrium: The atrium was severely dilated.  He was kept on a heparin gtt throughout his hospital course until his INR became therapeutic at 2.03.  Neuro okay with discharge with INR > 2.    He did have a palpable left radial pulse by discharge.  Smoking cessation was stressed to the pt everyday as he is at risk for limb loss with continued smoking.  The remainder of the hospital course consisted of increasing mobilization and increasing intake of solids without difficulty.  CBC    Component Value Date/Time   WBC 5.9 11/26/2014 0246   RBC 4.04* 11/26/2014 0246   HGB 12.8* 11/26/2014 0246   HCT 37.7* 11/26/2014 0246   PLT 286 11/26/2014 0246  MCV 93.3 11/26/2014 0246   MCH 31.7 11/26/2014 0246   MCHC 34.0 11/26/2014 0246   RDW 12.8 11/26/2014 0246   LYMPHSABS 2.7 11/20/2014 1417   MONOABS 0.8 11/20/2014 1417   EOSABS  0.1 11/20/2014 1417   BASOSABS 0.0 11/20/2014 1417    BMET    Component Value Date/Time   NA 133* 11/21/2014 0307   K 3.7 11/21/2014 0307   CL 100* 11/21/2014 0307   CO2 24 11/21/2014 0307   GLUCOSE 122* 11/21/2014 0307   BUN 7 11/21/2014 0307   CREATININE 0.94 11/21/2014 0307   CREATININE 1.23 08/07/2014 0913   CALCIUM 8.4* 11/21/2014 0307   GFRNONAA >60 11/21/2014 0307   GFRNONAA >89 03/14/2014 1338   GFRAA >60 11/21/2014 0307   GFRAA >89 03/14/2014 1338          Discharge Instructions    Call MD for:  redness, tenderness, or signs of infection (pain, swelling, bleeding, redness, odor or green/yellow discharge around incision site)    Complete by:  As directed      Call MD for:  severe or increased pain, loss or decreased feeling  in affected limb(s)    Complete by:  As directed      Call MD for:  temperature >100.5    Complete by:  As directed      Discharge instructions    Complete by:  As directed   Take coumadin 5mg  daily starting on 11/26/14.  Have INR checked on 11/28/14 at 12:15pm.     Driving Restrictions    Complete by:  As directed   No driving for 2 weeks     Lifting restrictions    Complete by:  As directed   No lifting for 2 weeks     Resume previous diet    Complete by:  As directed      may wash over wound with mild soap and water    Complete by:  As directed            Discharge Diagnosis:  Left arm pain [M79.602]  Secondary Diagnosis: Patient Active Problem List   Diagnosis Date Noted  . Ischemia of extremity 11/20/2014  . Ischemia of hand 04/05/2014  . Special screening for malignant neoplasms, colon 03/20/2014  . Warfarin-induced coagulopathy ([place]) 03/20/2014  . Encounter for therapeutic drug monitoring 03/08/2013  . Headache 01/29/2013  . Prostate nodule with urinary obstruction 12/13/2012  . Routine general medical examination at a health care facility 12/13/2012  . Depression with anxiety 12/07/2012  . Long term (current) use of  anticoagulants 04/30/2010  . HYPERCHOLESTEROLEMIA-PURE 04/22/2008  . TOBACCO ABUSE 04/22/2008  . HYPERTENSION, BENIGN ESSENTIAL 04/22/2008  . Atrial fibrillation ([place]) 04/22/2008  . COPD 04/22/2008   Past Medical History  Diagnosis Date  . Atrial fibrillation ([place])   . Hypertension   . Hyperlipidemia   . COPD (chronic obstructive pulmonary disease) ([place])   . Amaurosis fugax of left eye     history of   . Hx of echocardiogram     Echo (07/09/13):  Mild LVH, EF 55-60%, MAC, mild MR, severe LAE, mod RAE  . Renal insufficiency        Medication List    STOP taking these medications        enoxaparin 80 MG/0.8ML injection  Commonly known as:  LOVENOX      TAKE these medications        acetaminophen 500 MG tablet  Commonly known as:  TYLENOL  Take 1,000  mg by mouth 2 (two) times daily.     albuterol 108 (90 BASE) MCG/ACT inhaler  Commonly known as:  PROVENTIL HFA;VENTOLIN HFA  Inhale 2 puffs into the lungs every 6 (six) hours as needed for wheezing or shortness of breath.     atorvastatin 40 MG tablet  Commonly known as:  LIPITOR  Take 1 tablet (40 mg total) by mouth daily.     clonazePAM 1 MG tablet  Commonly known as:  KLONOPIN  Take 1 tablet (1 mg total) by mouth 3 (three) times daily.     diltiazem 240 MG 24 hr capsule  Commonly known as:  DILACOR XR  Take 1 capsule (240 mg total) by mouth daily.     gabapentin 100 MG capsule  Commonly known as:  NEURONTIN  Take 1 capsule (100 mg total) by mouth at bedtime.     metoprolol 100 MG tablet  Commonly known as:  LOPRESSOR  Take 1 tablet (100 mg total) by mouth 2 (two) times daily.     oxyCODONE-acetaminophen 5-325 MG tablet  Commonly known as:  PERCOCET/ROXICET  Take 1-2 tablets by mouth every 4 (four) hours as needed for moderate pain.     sertraline 50 MG tablet  Commonly known as:  ZOLOFT  Take 1 tablet (50 mg total) by mouth at bedtime.     warfarin 5 MG tablet  Commonly known as:  COUMADIN  Take 1  tablet (5 mg total) by mouth daily at 6 PM. Take as directed.  Dose may vary pending INR        Prescriptions given: Percocet#30 No Refill  Instructions: 1.  Keep arm elevated. 2.  No heavy lifting until returning to see Dr. Kellie Simmering. 3.  Continue coumadin 5mg  daily until being seen in the coumadin clinic.  INR needs to remain > 2.0  Disposition: home  Patient's condition: is Good  Follow up: 1. Dr. Kellie Simmering in 2 weeks 2. Coumadin clinic Thursday, November 28, 2014 @ 1215 3. Neurology in 2-4 weeks   Leontine Locket, [place] [facility] [phone] 11/26/2014  7:30 AM

## 2014-11-25 NOTE — Care Management Note (Signed)
Case Management Note  Patient Details  Name: INOCENTE KRACH MRN: [id number] Date of Birth: 16-Jan-1951  Subjective/Objective:    Pt is s/p 12 hours post-thrombectomy left axillobrachial bypass with extensive endarterectomy of brachial artery with Fogarty thrombectomy radial and ulnar arteries and Dacron patch angioplasty.                Action/Plan:  Pt is independent from home with support provided by neighbors when needed.  CM assessed pt and informed of PT/OT recommendations at home, pt is refusing [place] at this time.  Pt stated he had [place] approximately 6 months ago and he feels that he does not need it again.  CM will continue to monitor for disposition need.   Expected Discharge Date:                  Expected Discharge Plan:  [place]  In-House Referral:     Discharge planning Services  CM Consult  Post Acute Care Choice:    Choice offered to:  Patient  DME Arranged:    DME Agency:     HH Arranged:   OT,PT [place] Agency:   [place]  Status of Service:  In process, will continue to follow  Medicare Important Message Given:    Date Medicare IM Given:    Medicare IM give by:    Date Additional Medicare IM Given:    Additional Medicare Important Message give by:     If discussed at [place] of Stay Meetings, dates discussed:    Additional Comments: CM visited with pt, pt has reconsidered [place] service, pt is now agreeable to [facility], CM offered pt choice, pt chose [facility], agency contacted and referral was accepted.  Agency made aware of pending discharge today.  Address and phone number verified against epic. [name], RN 11/25/2014, 11:10 AM

## 2014-11-26 ENCOUNTER — Other Ambulatory Visit: Payer: Self-pay

## 2014-11-26 DIAGNOSIS — I63012 Cerebral infarction due to thrombosis of left vertebral artery: Secondary | ICD-10-CM

## 2014-11-26 LAB — PROTIME-INR
INR: 2.03 — AB (ref 0.00–1.49)
Prothrombin Time: 22.8 seconds — ABNORMAL HIGH (ref 11.6–15.2)

## 2014-11-26 LAB — CBC
HEMATOCRIT: 37.7 % — AB (ref 39.0–52.0)
Hemoglobin: 12.8 g/dL — ABNORMAL LOW (ref 13.0–17.0)
MCH: 31.7 pg (ref 26.0–34.0)
MCHC: 34 g/dL (ref 30.0–36.0)
MCV: 93.3 fL (ref 78.0–100.0)
PLATELETS: 286 10*3/uL (ref 150–400)
RBC: 4.04 MIL/uL — ABNORMAL LOW (ref 4.22–5.81)
RDW: 12.8 % (ref 11.5–15.5)
WBC: 5.9 10*3/uL (ref 4.0–10.5)

## 2014-11-26 LAB — HEMOGLOBIN A1C
Hgb A1c MFr Bld: 5.4 % (ref 4.8–5.6)
Mean Plasma Glucose: 108 mg/dL

## 2014-11-26 LAB — HEPARIN LEVEL (UNFRACTIONATED): Heparin Unfractionated: 0.1 IU/mL — ABNORMAL LOW (ref 0.30–0.70)

## 2014-11-26 MED ORDER — WARFARIN SODIUM 5 MG PO TABS: 5.0000 mg | ORAL_TABLET | Freq: Once | ORAL | Status: DC

## 2014-11-26 MED ORDER — WARFARIN SODIUM 5 MG PO TABS: ORAL_TABLET | ORAL | Status: DC

## 2014-11-26 MED ORDER — WARFARIN SODIUM 5 MG PO TABS: 5.0000 mg | ORAL_TABLET | Freq: Every day | ORAL | Status: DC

## 2014-11-26 NOTE — Progress Notes (Signed)
ANTICOAGULATION CONSULT NOTE - Follow Up Consult  Pharmacy Consult for Heparin and Warfarin Indication: atrial fibrillation  Allergies  Allergen Reactions  . Aspirin Nausea And Vomiting    Patient Measurements: Height: 5\' 10"  (177.8 cm) Weight: 178 lb 12.7 oz (81.1 kg) IBW/kg (Calculated) : 73  Vital Signs: Temp: 98.2 F (36.8 C) (10/18 0432) Temp Source: Oral (10/18 0432) BP: 103/69 mmHg (10/18 0432) Pulse Rate: 53 (10/18 0432)  Labs:  Recent Labs  11/24/14 0430 11/25/14 0306 11/26/14 0246  HGB 12.3* 12.4* 12.8*  HCT 36.8* 37.4* 37.7*  PLT 245 272 286  LABPROT 21.3* 21.5* 22.8*  INR 1.85* 1.88* 2.03*  HEPARINUNFRC 0.37 0.46 0.10*    Estimated Creatinine Clearance: 80.9 mL/min (by C-G formula based on Cr of 0.94).   Assessment: 64 year old male continues on heparin and Coumadin for Afib, s/p thrombectomies 10/12 MRI positive for cerebral emboli this admission, workup underway  Home dose of Coumadin - 2.5 on Wednesdays, 5 mg other days  Heparin level low this am on 1300 units/hr-will stop with therapeutic INR INR today = 2.03 (now at goal), CBC stable.   D/w warfarin dosing with [facility] PA, will d/c on 5mg  daily with INR recheck on Thursday  Goal of Therapy:  INR goal 2-3 Heparin level 0.3-0.7 units/ml Monitor platelets by anticoagulation protocol: Yes   Plan:  D/c home today Warfarin 5mg  until recheck on thursday  Thank you,  Erin Hearing PharmD., BCPS Clinical Pharmacist Pager [phone] 11/26/2014 8:35 AM

## 2014-11-26 NOTE — Progress Notes (Addendum)
  Progress Note    11/26/2014 7:12 AM 6 Days Post-Op  Subjective:  Ready to go home  Afebrile HR 50's-70's Afib 573'U-202'R systolic 42% RA  Filed Vitals:   11/26/14 0432  BP: 103/69  Pulse: 53  Temp: 98.2 F (36.8 C)  Resp: 18    Physical Exam: Cardiac:  irregular Lungs:  Non labored Incisions:  Healing nicely Extremities:  2+ left radial pulse is palpable; motor/sensation are in tact.   CBC    Component Value Date/Time   WBC 5.9 11/26/2014 0246   RBC 4.04* 11/26/2014 0246   HGB 12.8* 11/26/2014 0246   HCT 37.7* 11/26/2014 0246   PLT 286 11/26/2014 0246   MCV 93.3 11/26/2014 0246   MCH 31.7 11/26/2014 0246   MCHC 34.0 11/26/2014 0246   RDW 12.8 11/26/2014 0246   LYMPHSABS 2.7 11/20/2014 1417   MONOABS 0.8 11/20/2014 1417   EOSABS 0.1 11/20/2014 1417   BASOSABS 0.0 11/20/2014 1417    BMET    Component Value Date/Time   NA 133* 11/21/2014 0307   K 3.7 11/21/2014 0307   CL 100* 11/21/2014 0307   CO2 24 11/21/2014 0307   GLUCOSE 122* 11/21/2014 0307   BUN 7 11/21/2014 0307   CREATININE 0.94 11/21/2014 0307   CREATININE 1.23 08/07/2014 0913   CALCIUM 8.4* 11/21/2014 0307   GFRNONAA >60 11/21/2014 0307   GFRNONAA >89 03/14/2014 1338   GFRAA >60 11/21/2014 0307   GFRAA >89 03/14/2014 1338    INR    Component Value Date/Time   INR 2.03* 11/26/2014 0246   INR 2.1 10/15/2014 1159   INR 2.7 04/02/2010 1021     Intake/Output Summary (Last 24 hours) at 11/26/14 7062 Last data filed at 11/25/14 1902  Gross per 24 hour  Intake   1195 ml  Output      0 ml  Net   1195 ml   MRA head 11/25/14: IMPRESSION: Suspected acute thrombosis distal LEFT vertebral with absent flow related enhancement on intracranial MRA. Unclear if the observed pattern of infarctions result from emboli from the LEFT vertebral vessel or from atrial fibrillation.  2D Echo 11/25/14: Study Conclusions  - Left ventricle: The cavity size was normal. There was  moderate concentric hypertrophy. Systolic function was normal. The estimated ejection fraction was in the range of 60% to 65%. Wall motion was normal; there were no regional wall motion abnormalities. The study was not technically sufficient to allow evaluation of LV diastolic dysfunction due to atrial fibrillation. - Mitral valve: There was mild regurgitation. - Left atrium: The atrium was severely dilated.   Assessment:  64 y.o. male is s/p:  1. Thrombectomy Left Axilo-Brachial Bypass  2. Thromboendarterecotmy of Left Brachial Artery with Fogarty Thrombectomy of Radial and Ulnar Arteries with Dacron patch angioplasty Left Brachial Artery.  3. Intraoperative Arteriogram times four.  6 Days Post-Op  Plan: -pt now with palpable left radial pulse -INR > 2--will d/c home today on coumadin 5mg  daily.  He is scheduled to have his INR checked on Thursday at 1215. -DVT prophylaxis:  Heparin/coumadin -appreciate neuro's help-MRA revealed thrombosis of distal left verterbral with absent flow  -will have pt f/u with neuro in 2-4 weeks. -smoking cessation STRESSED again today  -f/u with Dr. Kellie Simmering in 2 weeks.   Leontine Locket, PA-C [facility] [phone] 11/26/2014 7:12 AM

## 2014-11-27 ENCOUNTER — Telehealth: Payer: Self-pay | Admitting: Vascular Surgery

## 2014-11-27 DIAGNOSIS — Z48812 Encounter for surgical aftercare following surgery on the circulatory system: Secondary | ICD-10-CM | POA: Diagnosis not present

## 2014-11-27 DIAGNOSIS — I4891 Unspecified atrial fibrillation: Secondary | ICD-10-CM | POA: Diagnosis not present

## 2014-11-27 DIAGNOSIS — F17210 Nicotine dependence, cigarettes, uncomplicated: Secondary | ICD-10-CM | POA: Diagnosis not present

## 2014-11-27 DIAGNOSIS — E785 Hyperlipidemia, unspecified: Secondary | ICD-10-CM | POA: Diagnosis not present

## 2014-11-27 DIAGNOSIS — I10 Essential (primary) hypertension: Secondary | ICD-10-CM | POA: Diagnosis not present

## 2014-11-27 DIAGNOSIS — J449 Chronic obstructive pulmonary disease, unspecified: Secondary | ICD-10-CM | POA: Diagnosis not present

## 2014-11-27 NOTE — Telephone Encounter (Signed)
-----   Message from Denman George, RN sent at 11/26/2014  4:37 PM EDT ----- This has been done. ----- Message -----    From: Gena Fray    Sent: 11/26/2014  12:31 PM      To: [facility] Clinical Pool  Please enter a referral to Neuro as indicated by Aldona Bar below.  Thank you! Hinton Dyer ----- Message -----    From: Gabriel Earing, PA-C    Sent: 11/26/2014   7:40 AM      To: [facility] Admin Pool  Please make sure this pt gets an appt with neuro in the next 2-4 weeks for f/u to acute stroke.   Thanks! Aldona Bar

## 2014-11-27 NOTE — Telephone Encounter (Signed)
I called [facility], they will schedule him from their WQ. I have changed it from routine to "high" priority due to needing it in 2-4 weeks.

## 2014-11-27 NOTE — Telephone Encounter (Signed)
-----   Message from Mena Goes, RN sent at 11/22/2014 12:16 PM EDT ----- Regarding: Schedule   ----- Message -----    From: Ulyses Amor, PA-C    Sent: 11/22/2014   9:33 AM      To: [facility] Charge Pool  S/P left arm embolectomy f/u with Sr. Kellie Simmering in 2 weeks

## 2014-11-27 NOTE — Telephone Encounter (Signed)
LM for pt re appt, dpm °

## 2014-11-28 ENCOUNTER — Ambulatory Visit (INDEPENDENT_AMBULATORY_CARE_PROVIDER_SITE_OTHER): Payer: Medicare Other | Admitting: *Deleted

## 2014-11-28 DIAGNOSIS — I4891 Unspecified atrial fibrillation: Secondary | ICD-10-CM | POA: Diagnosis not present

## 2014-11-28 DIAGNOSIS — I10 Essential (primary) hypertension: Secondary | ICD-10-CM | POA: Diagnosis not present

## 2014-11-28 DIAGNOSIS — E785 Hyperlipidemia, unspecified: Secondary | ICD-10-CM | POA: Diagnosis not present

## 2014-11-28 DIAGNOSIS — J449 Chronic obstructive pulmonary disease, unspecified: Secondary | ICD-10-CM | POA: Diagnosis not present

## 2014-11-28 DIAGNOSIS — F17210 Nicotine dependence, cigarettes, uncomplicated: Secondary | ICD-10-CM | POA: Diagnosis not present

## 2014-11-28 DIAGNOSIS — Z7901 Long term (current) use of anticoagulants: Secondary | ICD-10-CM | POA: Diagnosis not present

## 2014-11-28 DIAGNOSIS — Z48812 Encounter for surgical aftercare following surgery on the circulatory system: Secondary | ICD-10-CM | POA: Diagnosis not present

## 2014-11-28 DIAGNOSIS — Z5181 Encounter for therapeutic drug level monitoring: Secondary | ICD-10-CM

## 2014-11-28 LAB — POCT INR: INR: 3.3

## 2014-12-03 DIAGNOSIS — J449 Chronic obstructive pulmonary disease, unspecified: Secondary | ICD-10-CM | POA: Diagnosis not present

## 2014-12-03 DIAGNOSIS — I10 Essential (primary) hypertension: Secondary | ICD-10-CM | POA: Diagnosis not present

## 2014-12-03 DIAGNOSIS — E785 Hyperlipidemia, unspecified: Secondary | ICD-10-CM | POA: Diagnosis not present

## 2014-12-03 DIAGNOSIS — F17210 Nicotine dependence, cigarettes, uncomplicated: Secondary | ICD-10-CM | POA: Diagnosis not present

## 2014-12-03 DIAGNOSIS — Z48812 Encounter for surgical aftercare following surgery on the circulatory system: Secondary | ICD-10-CM | POA: Diagnosis not present

## 2014-12-03 DIAGNOSIS — I4891 Unspecified atrial fibrillation: Secondary | ICD-10-CM | POA: Diagnosis not present

## 2014-12-05 ENCOUNTER — Ambulatory Visit (INDEPENDENT_AMBULATORY_CARE_PROVIDER_SITE_OTHER): Payer: Medicare Other | Admitting: *Deleted

## 2014-12-05 ENCOUNTER — Ambulatory Visit (INDEPENDENT_AMBULATORY_CARE_PROVIDER_SITE_OTHER): Payer: Medicare Other | Admitting: Neurology

## 2014-12-05 ENCOUNTER — Encounter: Payer: Self-pay | Admitting: Neurology

## 2014-12-05 VITALS — BP 125/96 | HR 61 | Ht 70.0 in | Wt 178.6 lb

## 2014-12-05 DIAGNOSIS — Z48812 Encounter for surgical aftercare following surgery on the circulatory system: Secondary | ICD-10-CM | POA: Diagnosis not present

## 2014-12-05 DIAGNOSIS — I10 Essential (primary) hypertension: Secondary | ICD-10-CM | POA: Diagnosis not present

## 2014-12-05 DIAGNOSIS — I742 Embolism and thrombosis of arteries of the upper extremities: Secondary | ICD-10-CM

## 2014-12-05 DIAGNOSIS — Z7901 Long term (current) use of anticoagulants: Secondary | ICD-10-CM | POA: Diagnosis not present

## 2014-12-05 DIAGNOSIS — I639 Cerebral infarction, unspecified: Secondary | ICD-10-CM | POA: Insufficient documentation

## 2014-12-05 DIAGNOSIS — J449 Chronic obstructive pulmonary disease, unspecified: Secondary | ICD-10-CM | POA: Diagnosis not present

## 2014-12-05 DIAGNOSIS — I4891 Unspecified atrial fibrillation: Secondary | ICD-10-CM | POA: Diagnosis not present

## 2014-12-05 DIAGNOSIS — E785 Hyperlipidemia, unspecified: Secondary | ICD-10-CM | POA: Diagnosis not present

## 2014-12-05 DIAGNOSIS — Z5181 Encounter for therapeutic drug level monitoring: Secondary | ICD-10-CM | POA: Diagnosis not present

## 2014-12-05 DIAGNOSIS — I63139 Cerebral infarction due to embolism of unspecified carotid artery: Secondary | ICD-10-CM | POA: Diagnosis not present

## 2014-12-05 DIAGNOSIS — F17210 Nicotine dependence, cigarettes, uncomplicated: Secondary | ICD-10-CM | POA: Diagnosis not present

## 2014-12-05 LAB — POCT INR: INR: 3.7

## 2014-12-05 NOTE — Progress Notes (Signed)
[facility] [address] [place]. [place] [address] [phone]       OFFICE FOLLOW-UP NOTE  [name] Date of Birth:  Jan 09, 1951 Medical Record Number:  [id number]   HPI: [name] is a 58 year Caucasian male today seen for first office follow-up visit following recent hospital admission and stroke.[name] is an 64 y.o. male with a history of atrial fibrillation, hypertension, hyperlipidemia, COPD, amaurosis fugax and peripheral vascular disease, admitted on 11/20/2014 for acute occlusion of left axillobrachial bypass. He is status post extensive left brachial endarterectomy and angioplasty, also performed on 11/20/2014. Patient noticed blurring of vision on the left side on 01/21/2015. He has a history of amaurosis fugax involving his right eye which resolved. MRI of his brain showed multiple areas of acute infarct involving the cerebellum bilaterally as well as right temporal lobe and occipital lobes bilaterally consistent with embolic phenomena. Patient has no previous history of stroke. He has been on Coumadin for atrial fibrillation and INR on admission was 1.45. INR today was 1.85. NIH stroke score at the time of this evaluation was 0. LKW: Unclear tPA Given: No. Patient was not administered TPA secondary to unclear time of onset of embolic strokes. MRI scan of the brain showing multiple areas of acute infarcts involving bilateral cerebellum, right temporal lobe and occipital lobes bilaterally consistent with cerebral emboli. Carotid Doppler showed mild calcific plaques but no significant extracranial stenosis. Transthoracic echo showed normal ejection fraction without definite cardiac source of embolism. Patient had history of atrial fibrillation and was on warfarin and INR was 1.8 the day of his strokes. Lipid profile showed total cholesterol 143, triglycerides 101, HDL 41 and LDL 82 mg percent. Hemoglobin A1c was 5.4. Patient complained of blurred  vision bilaterally and loss of central vision in the right eye. However his vision acuity and fields appear normal. Patient states he is doing better since discharge his incision however still remains blurred. He has trouble reading but he has full field of vision. He remains on warfarin and his INR continues to fluctuate. Patient states he cannot afford the January 2 now will need anticoagulation because of cost factor. He states his blood pressure is doing well with his 125/96 today. Patient has quit smoking since this stroke and is proud of this fact. His INR today was 3.7.  ROS:   14 system review of systems is positive for hearing loss, ringing in the ears, itching, blurred vision, shortness of breath, blood in the urine, feeling hot, joint pain, skin sensitivity, memory loss, confusion, weakness, dizziness, tumors sleep, not enough sleep, decreased energy, restless legs and all other systems negative  PMH:  Past Medical History  Diagnosis Date  . Atrial fibrillation ([place])   . Hypertension   . Hyperlipidemia   . COPD (chronic obstructive pulmonary disease) ([place])   . Amaurosis fugax of left eye     history of   . Hx of echocardiogram     Echo (07/09/13):  Mild LVH, EF 55-60%, MAC, mild MR, severe LAE, mod RAE  . Renal insufficiency   . Stroke [facility])     Social History:  Social History   Social History  . Marital Status: Divorced    Spouse Name: N/A  . Number of Children: N/A  . 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 Smoking status: Former Smoker -- 2.00 packs/day for 45 years  Types: Cigarettes    Quit date: 11/25/2014  . Smokeless tobacco: Never Used  . Alcohol Use: No  . Drug Use: No  . Sexual Activity: Not Currently   Other Topics Concern  . Not on file   Social History Narrative    Medications:   Current Outpatient Prescriptions on File Prior to Visit  Medication Sig Dispense Refill  .  acetaminophen (TYLENOL) 500 MG tablet Take 1,000 mg by mouth 2 (two) times daily.    Marland Kitchen albuterol (PROVENTIL HFA;VENTOLIN HFA) 108 (90 BASE) MCG/ACT inhaler Inhale 2 puffs into the lungs every 6 (six) hours as needed for wheezing or shortness of breath.    Marland Kitchen atorvastatin (LIPITOR) 40 MG tablet Take 1 tablet (40 mg total) by mouth daily. 90 tablet 3  . clonazePAM (KLONOPIN) 1 MG tablet Take 1 tablet (1 mg total) by mouth 3 (three) times daily. 75 tablet 2  . diltiazem (DILACOR XR) 240 MG 24 hr capsule Take 1 capsule (240 mg total) by mouth daily. 90 capsule 3  . gabapentin (NEURONTIN) 100 MG capsule Take 1 capsule (100 mg total) by mouth at bedtime.    . metoprolol (LOPRESSOR) 100 MG tablet Take 1 tablet (100 mg total) by mouth 2 (two) times daily. 180 tablet 1  . oxyCODONE-acetaminophen (PERCOCET/ROXICET) 5-325 MG tablet Take 1-2 tablets by mouth every 4 (four) hours as needed for moderate pain. 30 tablet 0  . sertraline (ZOLOFT) 50 MG tablet Take 1 tablet (50 mg total) by mouth at bedtime. (Patient taking differently: Take 50 mg by mouth at bedtime as needed. )    . warfarin (COUMADIN) 5 MG tablet Take 1 tablet (5 mg total) by mouth daily at 6 PM. Take as directed.  Dose may vary pending INR     No current facility-administered medications on file prior to visit.    Allergies:   Allergies  Allergen Reactions  . Aspirin Nausea And Vomiting    Physical Exam General: well developed, well nourished middle aged Caucasian male, seated, in no evident distress Head: head normocephalic and atraumatic.  Neck: supple with no carotid or supraclavicular bruits Cardiovascular: regular rate and rhythm, no murmurs Musculoskeletal: no deformity. Mild kyphosis Skin:  no rash/petichiae Vascular:  Normal pulses all extremities Filed Vitals:   12/05/14 1441  BP: 125/96  Pulse: 61   Neurologic Exam Mental Status: Awake and fully alert. Oriented to place and time. Recent and remote memory intact.  Attention span, concentration and fund of knowledge appropriate. Mood and affect appropriate.  Cranial Nerves: Fundoscopic exam reveals sharp disc margins. Pupils equal, briskly reactive to light. Extraocular movements full without nystagmus. Visual fields full to confrontation. Hearing intact. Facial sensation intact. Face, tongue, palate moves normally and symmetrically.  Motor: Normal bulk and tone. Normal strength in all tested extremity muscles. Sensory.: intact to touch ,pinprick .position and vibratory sensation.  Coordination: Rapid alternating movements normal in all extremities. Finger-to-nose and heel-to-shin performed accurately bilaterally. Gait and Station: Arises from chair without difficulty. Stance is normal. Gait demonstrates normal stride length and balance . Unable to heel, toe and tandem walk without difficulty.  Reflexes: 1+ and symmetric. Toes downgoing.   NIHSS  0 Modified Rankin  1  ASSESSMENT: 47 year Caucasian male with silent bilateral anterior and posterior circulation infarcts embolic secondary to known atrial fibrillation vs post procedure event    PLAN: I had a long d/w patient about his recent stroke, risk for recurrent stroke/TIAs, personally independently reviewed imaging studies and stroke evaluation results  and answered questions.Continue warfarin daily  for secondary stroke prevention and maintain strict control of hypertension with blood pressure goal below 130/90, diabetes with hemoglobin A1c goal below 6.5% and lipids with LDL cholesterol goal below 100 mg/dL. I also advised the patient to eat a healthy diet with plenty of whole grains, cereals, fruits and vegetables, exercise regularly and maintain ideal body weight .I advised him to see his ophthalmologist in [place] for his blurred vision. I also complimented him on quitting smoking and encouraged him to continue to do so.Greater than 50% of time during this 25 minute visit was spent on  counseling,explanation of diagnosis, planning of further management, discussion with patient and family and coordination of care  Followup in the future with me in 6 months or call earlier if necessary. Antony Contras, MD S  Note: This document was prepared with digital dictation and possible smart phrase technology. Any transcriptional errors that result from this process are unintentional

## 2014-12-05 NOTE — Patient Instructions (Signed)
I had a long d/w patient about his recent stroke, risk for recurrent stroke/TIAs, personally independently reviewed imaging studies and stroke evaluation results and answered questions.Continue warfarin daily  for secondary stroke prevention and maintain strict control of hypertension with blood pressure goal below 130/90, diabetes with hemoglobin A1c goal below 6.5% and lipids with LDL cholesterol goal below 100 mg/dL. I also advised the patient to eat a healthy diet with plenty of whole grains, cereals, fruits and vegetables, exercise regularly and maintain ideal body weight .I advised him to see his ophthalmologist in [place] for his blurred vision. I also complimented him on quitting smoking and encouraged him to continue to do so. Followup in the future with me in 6 months or call earlier if necessary. Stroke Prevention Some medical conditions and behaviors are associated with an increased chance of having a stroke. You may prevent a stroke by making healthy choices and managing medical conditions. HOW CAN I REDUCE MY RISK OF HAVING A STROKE?   Stay physically active. Get at least 30 minutes of activity on most or all days.  Do not smoke. It may also be helpful to avoid exposure to secondhand smoke.  Limit alcohol use. Moderate alcohol use is considered to be:  No more than 2 drinks per day for men.  No more than 1 drink per day for nonpregnant women.  Eat healthy foods. This involves:  Eating 5 or more servings of fruits and vegetables a day.  Making dietary changes that address high blood pressure (hypertension), high cholesterol, diabetes, or obesity.  Manage your cholesterol levels.  Making food choices that are high in fiber and low in saturated fat, trans fat, and cholesterol may control cholesterol levels.  Take any prescribed medicines to control cholesterol as directed by your health care provider.  Manage your diabetes.  Controlling your carbohydrate and sugar intake is  recommended to manage diabetes.  Take any prescribed medicines to control diabetes as directed by your health care provider.  Control your hypertension.  Making food choices that are low in salt (sodium), saturated fat, trans fat, and cholesterol is recommended to manage hypertension.  Ask your health care provider if you need treatment to lower your blood pressure. Take any prescribed medicines to control hypertension as directed by your health care provider.  If you are 45-35 years of age, have your blood pressure checked every 3-5 years. If you are 53 years of age or older, have your blood pressure checked every year.  Maintain a healthy weight.  Reducing calorie intake and making food choices that are low in sodium, saturated fat, trans fat, and cholesterol are recommended to manage weight.  Stop drug abuse.  Avoid taking birth control pills.  Talk to your health care provider about the risks of taking birth control pills if you are over 33 years old, smoke, get migraines, or have ever had a blood clot.  Get evaluated for sleep disorders (sleep apnea).  Talk to your health care provider about getting a sleep evaluation if you snore a lot or have excessive sleepiness.  Take medicines only as directed by your health care provider.  For some people, aspirin or blood thinners (anticoagulants) are helpful in reducing the risk of forming abnormal blood clots that can lead to stroke. If you have the irregular heart rhythm of atrial fibrillation, you should be on a blood thinner unless there is a good reason you cannot take them.  Understand all your medicine instructions.  Make sure that other  conditions (such as anemia or atherosclerosis) are addressed. SEEK IMMEDIATE MEDICAL CARE IF:   You have sudden weakness or numbness of the face, arm, or leg, especially on one side of the body.  Your face or eyelid droops to one side.  You have sudden confusion.  You have trouble speaking  (aphasia) or understanding.  You have sudden trouble seeing in one or both eyes.  You have sudden trouble walking.  You have dizziness.  You have a loss of balance or coordination.  You have a sudden, severe headache with no known cause.  You have new chest pain or an irregular heartbeat. Any of these symptoms may represent a serious problem that is an emergency. Do not wait to see if the symptoms will go away. Get medical help at once. Call your local emergency services (911 in [place] Do not drive yourself to the hospital.   This information is not intended to replace advice given to you by your health care provider. Make sure you discuss any questions you have with your health care provider.   Document Released: 03/04/2004 Document Revised: 02/15/2014 Document Reviewed: 07/28/2012 Elsevier Interactive Patient Education [employer].

## 2014-12-09 ENCOUNTER — Encounter: Payer: Self-pay | Admitting: Vascular Surgery

## 2014-12-10 ENCOUNTER — Ambulatory Visit (INDEPENDENT_AMBULATORY_CARE_PROVIDER_SITE_OTHER): Payer: Medicare Other | Admitting: Vascular Surgery

## 2014-12-10 ENCOUNTER — Encounter: Payer: Self-pay | Admitting: Vascular Surgery

## 2014-12-10 VITALS — BP 139/95 | HR 108 | Temp 98.0°F | Resp 18 | Ht 70.0 in | Wt 174.0 lb

## 2014-12-10 DIAGNOSIS — I739 Peripheral vascular disease, unspecified: Secondary | ICD-10-CM

## 2014-12-10 DIAGNOSIS — E785 Hyperlipidemia, unspecified: Secondary | ICD-10-CM | POA: Diagnosis not present

## 2014-12-10 DIAGNOSIS — Z48812 Encounter for surgical aftercare following surgery on the circulatory system: Secondary | ICD-10-CM | POA: Diagnosis not present

## 2014-12-10 DIAGNOSIS — I10 Essential (primary) hypertension: Secondary | ICD-10-CM | POA: Diagnosis not present

## 2014-12-10 DIAGNOSIS — J449 Chronic obstructive pulmonary disease, unspecified: Secondary | ICD-10-CM | POA: Diagnosis not present

## 2014-12-10 DIAGNOSIS — I4891 Unspecified atrial fibrillation: Secondary | ICD-10-CM | POA: Diagnosis not present

## 2014-12-10 DIAGNOSIS — F17210 Nicotine dependence, cigarettes, uncomplicated: Secondary | ICD-10-CM | POA: Diagnosis not present

## 2014-12-10 NOTE — Progress Notes (Signed)
Subjective:     Patient ID: [name], male   DOB: January 30, 1951, 64 y.o.   MRN: [id number]  HPI This 64 year old male returns after emergency surgery on October 12 for an ischemic left upper extremity. Patient had previously undergone left axillobrachial bypass graft by Dr. Juanda Crumble fields in February 2016. This was a few days following a left brachial endarterectomy and embolectomy. 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 Patient did well until he developed acute occlusion of the bypass and ischemia of the left hand. Bypass was successfully thrombectomized through the axillary approach. There was diffuse disease in the brachial artery from the axilla to the brachial area. Brachial area was exposed and there was a false aneurysm were previous patch angioplasty had been performed. Radial and ulnar arteries were both severely diseased area we redid the endarterectomy and the brachial artery and applied a new patch and was able to get flow into the radial artery and also endarterectomized the mid brachial artery in the upper arm with a Fogarty catheter resulting in excellent flow to the wrist with a palpable pulse. He currently is on Coumadin therapy area he is having occasional numbness in the fingers of his left hand but otherwise just has typical incisionalpain. He takes Coumadin 5 mg per day except 2.5 mg on Wednesday and ishaving this folowed.  Review of Systems     Objective:   Physical Exam BP 139/95 mmHg  Pulse 108  Temp(Src) 98 F (36.7 C)  Resp 18  Ht 5\' 10"  (1.778 m)  Wt 174 lb (78.926 kg)  BMI 24.97 kg/m2  SpO2 98%   Gen. Well-developed well-nourished male in no apparent distress alert and oriented 3 Left upper extremity has nicely healed axillary and brachial antecubital wounds. He has a 3+ pulse in the brachial artery and a 2+ radial pulse distally. Good strength in the left wrist and hand. Decreased sensation in the fingers of the left hand.      Assessment:      patent left axillo- brachial bypass-saphenous vein following thrombectomy on November 20 2014   patent left brachial artery and antecubital area where endarterectomy was performed with outflow via radial artery  Chronic Coumadin therapy     Plan:      patient doing well at the present time but he realizes he is at high risk of reoccluding this because of severe disease in distal vessels   he will return in 6 months with a duplex scan of his left upper extremity from the axilla to the hand C Dr. Oneida Alar in follow-up  He realizes that if he develops severe pain or numbness in the left upper extremity he should go to the emergency department immediately to check for patency of the graft

## 2014-12-26 ENCOUNTER — Ambulatory Visit (INDEPENDENT_AMBULATORY_CARE_PROVIDER_SITE_OTHER): Payer: Medicare Other

## 2014-12-26 DIAGNOSIS — I4891 Unspecified atrial fibrillation: Secondary | ICD-10-CM

## 2014-12-26 DIAGNOSIS — Z5181 Encounter for therapeutic drug level monitoring: Secondary | ICD-10-CM | POA: Diagnosis not present

## 2014-12-26 DIAGNOSIS — Z7901 Long term (current) use of anticoagulants: Secondary | ICD-10-CM | POA: Diagnosis not present

## 2014-12-26 LAB — POCT INR: INR: 2.8

## 2014-12-30 DIAGNOSIS — H10413 Chronic giant papillary conjunctivitis, bilateral: Secondary | ICD-10-CM | POA: Diagnosis not present

## 2014-12-30 DIAGNOSIS — H5703 Miosis: Secondary | ICD-10-CM | POA: Diagnosis not present

## 2014-12-30 DIAGNOSIS — H2513 Age-related nuclear cataract, bilateral: Secondary | ICD-10-CM | POA: Diagnosis not present

## 2014-12-30 DIAGNOSIS — H348312 Tributary (branch) retinal vein occlusion, right eye, stable: Secondary | ICD-10-CM | POA: Diagnosis not present

## 2014-12-30 DIAGNOSIS — H43811 Vitreous degeneration, right eye: Secondary | ICD-10-CM | POA: Diagnosis not present

## 2015-01-09 ENCOUNTER — Ambulatory Visit (INDEPENDENT_AMBULATORY_CARE_PROVIDER_SITE_OTHER): Payer: Medicare Other | Admitting: *Deleted

## 2015-01-09 DIAGNOSIS — Z5181 Encounter for therapeutic drug level monitoring: Secondary | ICD-10-CM | POA: Diagnosis not present

## 2015-01-09 DIAGNOSIS — Z7901 Long term (current) use of anticoagulants: Secondary | ICD-10-CM

## 2015-01-09 DIAGNOSIS — I4891 Unspecified atrial fibrillation: Secondary | ICD-10-CM

## 2015-01-09 LAB — POCT INR: INR: 4.5

## 2015-01-20 ENCOUNTER — Inpatient Hospital Stay (HOSPITAL_COMMUNITY)
Admission: EM | Admit: 2015-01-20 | Discharge: 2015-01-22 | DRG: 301 | Discharge status: Against Medical Advice | Payer: Medicare Other | Attending: Internal Medicine | Admitting: Internal Medicine

## 2015-01-20 ENCOUNTER — Encounter (HOSPITAL_COMMUNITY): Payer: Self-pay | Admitting: Family Medicine

## 2015-01-20 DIAGNOSIS — M79602 Pain in left arm: Secondary | ICD-10-CM | POA: Diagnosis present

## 2015-01-20 DIAGNOSIS — F329 Major depressive disorder, single episode, unspecified: Secondary | ICD-10-CM | POA: Diagnosis present

## 2015-01-20 DIAGNOSIS — R791 Abnormal coagulation profile: Secondary | ICD-10-CM | POA: Diagnosis present

## 2015-01-20 DIAGNOSIS — E785 Hyperlipidemia, unspecified: Secondary | ICD-10-CM | POA: Diagnosis present

## 2015-01-20 DIAGNOSIS — Z7901 Long term (current) use of anticoagulants: Secondary | ICD-10-CM | POA: Diagnosis not present

## 2015-01-20 DIAGNOSIS — I480 Paroxysmal atrial fibrillation: Secondary | ICD-10-CM | POA: Diagnosis not present

## 2015-01-20 DIAGNOSIS — Z87891 Personal history of nicotine dependence: Secondary | ICD-10-CM

## 2015-01-20 DIAGNOSIS — I70298 Other atherosclerosis of native arteries of extremities, other extremity: Principal | ICD-10-CM | POA: Diagnosis present

## 2015-01-20 DIAGNOSIS — Z79899 Other long term (current) drug therapy: Secondary | ICD-10-CM

## 2015-01-20 DIAGNOSIS — I4891 Unspecified atrial fibrillation: Secondary | ICD-10-CM | POA: Diagnosis present

## 2015-01-20 DIAGNOSIS — F418 Other specified anxiety disorders: Secondary | ICD-10-CM | POA: Diagnosis present

## 2015-01-20 DIAGNOSIS — T45515A Adverse effect of anticoagulants, initial encounter: Secondary | ICD-10-CM | POA: Diagnosis present

## 2015-01-20 DIAGNOSIS — I998 Other disorder of circulatory system: Secondary | ICD-10-CM | POA: Diagnosis not present

## 2015-01-20 DIAGNOSIS — Z8673 Personal history of transient ischemic attack (TIA), and cerebral infarction without residual deficits: Secondary | ICD-10-CM | POA: Diagnosis not present

## 2015-01-20 DIAGNOSIS — E7800 Pure hypercholesterolemia, unspecified: Secondary | ICD-10-CM | POA: Diagnosis present

## 2015-01-20 DIAGNOSIS — I10 Essential (primary) hypertension: Secondary | ICD-10-CM | POA: Diagnosis not present

## 2015-01-20 DIAGNOSIS — I739 Peripheral vascular disease, unspecified: Secondary | ICD-10-CM | POA: Diagnosis present

## 2015-01-20 DIAGNOSIS — R200 Anesthesia of skin: Secondary | ICD-10-CM | POA: Diagnosis not present

## 2015-01-20 DIAGNOSIS — F419 Anxiety disorder, unspecified: Secondary | ICD-10-CM | POA: Diagnosis present

## 2015-01-20 DIAGNOSIS — J449 Chronic obstructive pulmonary disease, unspecified: Secondary | ICD-10-CM | POA: Diagnosis present

## 2015-01-20 LAB — CBC WITH DIFFERENTIAL/PLATELET
BASOS ABS: 0 10*3/uL (ref 0.0–0.1)
Basophils Relative: 0 %
Eosinophils Absolute: 0 10*3/uL (ref 0.0–0.7)
Eosinophils Relative: 0 %
HEMATOCRIT: 43.7 % (ref 39.0–52.0)
HEMOGLOBIN: 14.9 g/dL (ref 13.0–17.0)
LYMPHS ABS: 2.1 10*3/uL (ref 0.7–4.0)
LYMPHS PCT: 25 %
MCH: 30.6 pg (ref 26.0–34.0)
MCHC: 34.1 g/dL (ref 30.0–36.0)
MCV: 89.7 fL (ref 78.0–100.0)
Monocytes Absolute: 0.5 10*3/uL (ref 0.1–1.0)
Monocytes Relative: 5 %
NEUTROS ABS: 6 10*3/uL (ref 1.7–7.7)
NEUTROS PCT: 70 %
PLATELETS: 323 10*3/uL (ref 150–400)
RBC: 4.87 MIL/uL (ref 4.22–5.81)
RDW: 12.7 % (ref 11.5–15.5)
WBC: 8.6 10*3/uL (ref 4.0–10.5)

## 2015-01-20 LAB — BASIC METABOLIC PANEL
ANION GAP: 9 (ref 5–15)
BUN: 13 mg/dL (ref 6–20)
CHLORIDE: 104 mmol/L (ref 101–111)
CO2: 25 mmol/L (ref 22–32)
CREATININE: 0.85 mg/dL (ref 0.61–1.24)
Calcium: 9.7 mg/dL (ref 8.9–10.3)
GFR calc non Af Amer: >60 mL/min (ref >60)
Glucose, Bld: 104 mg/dL — ABNORMAL HIGH (ref 65–99)
Potassium: 3.7 mmol/L (ref 3.5–5.1)
SODIUM: 138 mmol/L (ref 135–145)

## 2015-01-20 LAB — PROTIME-INR
INR: 2.24 — ABNORMAL HIGH (ref 0.00–1.49)
Prothrombin Time: 24.6 seconds — ABNORMAL HIGH (ref 11.6–15.2)

## 2015-01-20 MED ORDER — ATORVASTATIN CALCIUM 40 MG PO TABS
40.0000 mg | ORAL_TABLET | Freq: Every day | ORAL | Status: DC
  Administered 2015-01-21 – 2015-01-22 (×2): 40 mg via ORAL
  Filled 2015-01-20 (×2): qty 1

## 2015-01-20 MED ORDER — ONDANSETRON HCL 4 MG PO TABS: 4.0000 mg | ORAL_TABLET | Freq: Four times a day (QID) | ORAL | Status: DC | PRN

## 2015-01-20 MED ORDER — ALBUTEROL SULFATE HFA 108 (90 BASE) MCG/ACT IN AERS: 2.0000 | INHALATION_SPRAY | Freq: Four times a day (QID) | RESPIRATORY_TRACT | Status: DC | PRN

## 2015-01-20 MED ORDER — METOPROLOL TARTRATE 100 MG PO TABS
100.0000 mg | ORAL_TABLET | Freq: Two times a day (BID) | ORAL | Status: DC
  Administered 2015-01-21 – 2015-01-22 (×4): 100 mg via ORAL
  Filled 2015-01-20 (×4): qty 1

## 2015-01-20 MED ORDER — MORPHINE SULFATE (PF) 2 MG/ML IV SOLN: 2.0000 mg | INTRAVENOUS | Status: DC | PRN

## 2015-01-20 MED ORDER — MORPHINE SULFATE (PF) 4 MG/ML IV SOLN: 6.0000 mg | Freq: Once | INTRAVENOUS | Status: DC

## 2015-01-20 MED ORDER — CLONAZEPAM 1 MG PO TABS
1.0000 mg | ORAL_TABLET | Freq: Three times a day (TID) | ORAL | Status: DC
  Administered 2015-01-21 – 2015-01-22 (×6): 1 mg via ORAL
  Filled 2015-01-20 (×6): qty 1

## 2015-01-20 MED ORDER — ALUM & MAG HYDROXIDE-SIMETH 200-200-20 MG/5ML PO SUSP: 30.0000 mL | Freq: Four times a day (QID) | ORAL | Status: DC | PRN

## 2015-01-20 MED ORDER — GABAPENTIN 100 MG PO CAPS
100.0000 mg | ORAL_CAPSULE | Freq: Every day | ORAL | Status: DC
Start: 2015-01-21 — End: 2015-01-22
  Administered 2015-01-21 (×2): 100 mg via ORAL
  Filled 2015-01-20 (×2): qty 1

## 2015-01-20 MED ORDER — SERTRALINE HCL 50 MG PO TABS: 50.0000 mg | ORAL_TABLET | Freq: Every evening | ORAL | Status: DC | PRN

## 2015-01-20 MED ORDER — ONDANSETRON HCL 4 MG/2ML IJ SOLN: 4.0000 mg | Freq: Four times a day (QID) | INTRAMUSCULAR | Status: DC | PRN

## 2015-01-20 MED ORDER — SODIUM CHLORIDE 0.9 % IJ SOLN
3.0000 mL | Freq: Two times a day (BID) | INTRAMUSCULAR | Status: DC
  Administered 2015-01-21 (×2): 3 mL via INTRAVENOUS

## 2015-01-20 MED ORDER — SODIUM CHLORIDE 0.9 % IV SOLN
INTRAVENOUS | Status: DC
  Administered 2015-01-21 – 2015-01-22 (×3): via INTRAVENOUS

## 2015-01-20 MED ORDER — ACETAMINOPHEN 325 MG PO TABS: 650.0000 mg | ORAL_TABLET | Freq: Four times a day (QID) | ORAL | Status: DC | PRN

## 2015-01-20 MED ORDER — DILTIAZEM HCL ER COATED BEADS 240 MG PO CP24
240.0000 mg | ORAL_CAPSULE | Freq: Every day | ORAL | Status: DC
  Administered 2015-01-21 – 2015-01-22 (×2): 240 mg via ORAL
  Filled 2015-01-20 (×3): qty 1

## 2015-01-20 MED ORDER — OXYCODONE-ACETAMINOPHEN 5-325 MG PO TABS
1.0000 | ORAL_TABLET | ORAL | Status: DC | PRN
  Administered 2015-01-21 – 2015-01-22 (×3): 1 via ORAL
  Filled 2015-01-20 (×3): qty 1

## 2015-01-20 NOTE — ED Notes (Signed)
Pt here for numbness and pain in left arm. Recent surgery in arm due to decreased blood flow. sts feels the same. Doppler and pulse present but weak.

## 2015-01-20 NOTE — Progress Notes (Signed)
ANTICOAGULATION CONSULT NOTE - Initial Consult  Pharmacy Consult for Heparin Indication: atrial fibrillation  Allergies  Allergen Reactions  . Aspirin Nausea And Vomiting    Patient Measurements:    Vital Signs: Temp: 98.2 F (36.8 C) (12/12 1821) Temp Source: Oral (12/12 1821) BP: 127/89 mmHg (12/12 2315) Pulse Rate: 68 (12/12 2315)  Labs:  Recent Labs  01/20/15 2251  HGB 14.9  HCT 43.7  PLT 323  LABPROT 24.6*  INR 2.24*  CREATININE 0.85    CrCl cannot be calculated (Unknown ideal weight.).   Medical History: Past Medical History  Diagnosis Date  . Atrial fibrillation ([place])   . Hypertension   . Hyperlipidemia   . COPD (chronic obstructive pulmonary disease) ([place])   . Amaurosis fugax of left eye     history of   . Hx of echocardiogram     Echo (07/09/13):  Mild LVH, EF 55-60%, MAC, mild MR, severe LAE, mod RAE  . Renal insufficiency   . Stroke [facility])     Medications:  Lipitor  Klonopin  Diltiazem  Neurontin  Lopressor  Percocet  Zoloft  APAP  Coumadin 5 mg daily  Assessment: 64 y.o. male admitted with pain/numbness in left arm, h/o Afib Coumadin on hold for possible procedure, for heparin Goal of Therapy:  Heparin level 0.3-0.7 units/ml Monitor platelets by anticoagulation protocol: Yes   Plan:  No heparin for now F/U daily INR and begin heparin once INR < 2  Jesyca Weisenburger, Bronson Curb 01/20/2015,11:58 PM

## 2015-01-20 NOTE — ED Provider Notes (Signed)
CSN: [id number]     Arrival date & time 01/20/15  1740 History   First MD Initiated Contact with Patient 01/20/15 2126     Chief Complaint  Patient presents with  . Numbness     (Consider location/radiation/quality/duration/timing/severity/associated sxs/prior Treatment) HPI  Patient is a 64 year old male with past medical history significant for a fib (coumadin), COPD, CVA, left axillary-brachial bypass s/p thromboendarterectomy, who presents to the emergency department with left arm paresthesias and pain. Reports a 2-3 hour history of left upper extremity paresthesias. Patient was concerned that he had another clot in his upper extremity. States it feels similar to prior acute arterial ischemia, however it is not as severe and his "arm is not blue". Denies fever, chills, chest pain, shortness of breath, upper arm swelling, rash.   Past Medical History  Diagnosis Date  . Atrial fibrillation ([place])   . Hypertension   . Hyperlipidemia   . COPD (chronic obstructive pulmonary disease) ([place])   . Amaurosis fugax of left eye     history of   . Hx of echocardiogram     Echo (07/09/13):  Mild LVH, EF 55-60%, MAC, mild MR, severe LAE, mod RAE  . Renal insufficiency   . Stroke [facility])    Past Surgical History  Procedure Laterality Date  . Embolectomy Left 04/05/2014    Procedure: THROMBO ENDARTERECTOMY OF LEFT BRACHIAL, RADIAL AND ULNAR ARTERY,  Left Radial artery cut down and radial artery thrombectomy.;  Surgeon: Elam Dutch, MD;  Location: [facility] OR;  Service: Vascular;  Laterality: Left;  . Patch angioplasty Left 04/05/2014    Procedure: LEFT ARM VEIN PATCH ANGIOPLASTY;  Surgeon: Elam Dutch, MD;  Location: [place];  Service: Vascular;  Laterality: Left;  . Arch aortogram N/A 04/05/2014    Procedure: ARCH AORTOGRAM, FIRST ORDER CATHETERIZATION LEFT SUBCLAVIAN ARTERY;  Surgeon: Elam Dutch, MD;  Location: [place];  Service: Vascular;  Laterality: N/A;  . Axillary-femoral bypass graft Left  04/08/2014    Procedure: LEFT AXILLARY ARTERY TO BRACHIAL ARTERY BYPASS USING NON REVERSE LEFT GREATER SAPHENOUS VEIN ,LIGATION OF LEFT AXILLARY ARTERY ANEURYSM;  Surgeon: Elam Dutch, MD;  Location: [place];  Service: Vascular;  Laterality: Left;  . Vein harvest Left 04/08/2014    Procedure: LEFT GREATER SAPHENOUS VEIN HARVEST;  Surgeon: Elam Dutch, MD;  Location: [place];  Service: Vascular;  Laterality: Left;  . Thrombectomy brachial artery Left 11/20/2014    Procedure: 1.  Thrombectomy Left Axilo-Brachial Bypass  2.  Thromboendarterecotmy of Left Brachial Artery with Fogarty Thrombectomy of Radial and Ulnar Arteries with Dacron patch angioplasty Left Brachial Artery. 3. Intraoperative  Arteriogram times four.;  Surgeon: Mal Misty, MD;  Location: [facility] OR;  Service: Vascular;  Laterality: Left;   Family History  Problem Relation Age of Onset  . Hypertension Mother   . Cancer Mother   . Cancer Father     gastric cancer  . Cancer Sister   . Early death Neg Hx   . Heart disease Neg Hx   . Hyperlipidemia Neg Hx   . Kidney disease Neg Hx   . Stroke Neg Hx    Social History  Substance Use Topics  . Smoking status: Former Smoker -- 2.00 packs/day for 45 years    Types: Cigarettes    Quit date: 11/25/2014  . Smokeless tobacco: Never Used  . Alcohol Use: No    Review of Systems  Constitutional: Negative for fever and appetite change.  HENT: Negative for  congestion and trouble swallowing.   Eyes: Negative for visual disturbance.  Respiratory: Negative for chest tightness, shortness of breath and wheezing.   Cardiovascular: Negative for chest pain and palpitations.  Gastrointestinal: Negative for nausea, vomiting, abdominal pain and blood in stool.  Genitourinary: Negative for dysuria, flank pain and decreased urine volume.  Musculoskeletal: Negative for back pain.  Skin: Negative for rash.  Neurological: Positive for numbness. Negative for dizziness, seizures, weakness,  light-headedness and headaches.  Psychiatric/Behavioral: Negative for behavioral problems.      Allergies  Aspirin  Home Medications   Prior to Admission medications   Medication Sig Start Date End Date Taking? Authorizing Provider  albuterol (PROVENTIL HFA;VENTOLIN HFA) 108 (90 BASE) MCG/ACT inhaler Inhale 2 puffs into the lungs every 6 (six) hours as needed for wheezing or shortness of breath.   Yes Historical Provider, MD  atorvastatin (LIPITOR) 40 MG tablet Take 1 tablet (40 mg total) by mouth daily. 09/26/14  Yes Lelon Perla, MD  clonazePAM (KLONOPIN) 1 MG tablet Take 1 tablet (1 mg total) by mouth 3 (three) times daily. 01/29/13  Yes Janith Lima, MD  diltiazem (DILACOR XR) 240 MG 24 hr capsule Take 1 capsule (240 mg total) by mouth daily. 09/26/14  Yes Lelon Perla, MD  gabapentin (NEURONTIN) 100 MG capsule Take 1 capsule (100 mg total) by mouth at bedtime. 07/11/13  Yes Janith Lima, MD  metoprolol (LOPRESSOR) 100 MG tablet Take 1 tablet (100 mg total) by mouth 2 (two) times daily. 10/18/14  Yes Lelon Perla, MD  oxyCODONE-acetaminophen (PERCOCET/ROXICET) 5-325 MG tablet Take 1-2 tablets by mouth every 4 (four) hours as needed for moderate pain. 11/25/14  Yes Samantha J Rhyne, PA-C  sertraline (ZOLOFT) 50 MG tablet Take 1 tablet (50 mg total) by mouth at bedtime. Patient taking differently: Take 50 mg by mouth at bedtime as needed.  07/11/13  Yes Janith Lima, MD  warfarin (COUMADIN) 5 MG tablet Take 1 tablet (5 mg total) by mouth daily at 6 PM. Take as directed.  Dose may vary pending INR 11/26/14  Yes Samantha J Rhyne, PA-C  acetaminophen (TYLENOL) 500 MG tablet Take 1,000 mg by mouth 2 (two) times daily.    Historical Provider, MD   BP 148/88 mmHg  Pulse 84  Temp(Src) 98.3 F (36.8 C) (Oral)  Resp 14  Wt 76.749 kg  SpO2 99% Physical Exam  Constitutional: He is oriented to person, place, and time. He appears well-developed and well-nourished.  HENT:  Head:  Atraumatic.  Mouth/Throat: Oropharynx is clear and moist.  Eyes: Conjunctivae and EOM are normal.  Neck: Normal range of motion. No JVD present. No tracheal deviation present.  Cardiovascular: Normal rate, regular rhythm, normal heart sounds and intact distal pulses.   Pulmonary/Chest: Effort normal and breath sounds normal. No respiratory distress. He has no wheezes.  Abdominal: Soft. He exhibits no distension. There is no tenderness.  Musculoskeletal: Normal range of motion.  LUE with +1 radial, ulna, brachial pulse. RUE +2 pulse. Sensation intact. Good cap refill. 5/5 strength. Full ROM throughout.   Neurological: He is alert and oriented to person, place, and time.  Skin: Skin is warm.  Psychiatric: He has a normal mood and affect.  Vitals reviewed.   ED Course  Procedures (including critical care time) Labs Review Labs Reviewed  PROTIME-INR - Abnormal; Notable for the following:    Prothrombin Time 24.6 (*)    INR 2.24 (*)    All other components within normal  limits  BASIC METABOLIC PANEL - Abnormal; Notable for the following:    Glucose, Bld 104 (*)    All other components within normal limits  APTT - Abnormal; Notable for the following:    aPTT 47 (*)    All other components within normal limits  CBC WITH DIFFERENTIAL/PLATELET  COMPREHENSIVE METABOLIC PANEL  CBC  PROTIME-INR  TYPE AND SCREEN    Imaging Review No results found. I have personally reviewed and evaluated these images and lab results as part of my medical decision-making.   EKG Interpretation None      MDM   Final diagnoses:  Left arm pain   Patient is a 64 year old male with past medical history significant for a fib (coumadin), COPD, CVA, left axillary-brachial bypass s/p thromboendarterectomy, who presents to the emergency department with left arm paresthesias and pain. On arrival in no obvious distress. Afebrile, hemodynamically stable. Exam as above, notable for +1 palpable pulse of radial,  ulnar, and brachial arteries, +2 radial RUE, good cap refill, no change in color, full ROM throughout, sensation intact.   Do not feel that the patient with acute limb ischemia at this time; however, concerned that ischemia could develop overnight. INR 2.24. No leukocytosis. No signs of infection.   Unable to obtain ultrasound of left upper extremity overnight. Vascular surgery, Dr. Donnetta Hutching, consulted, recommended admission to hospitalist service and will evaluate the patient in the morning. Hospitalist, Dr. Blaine Hamper, admitted the patient. Patient stable for the floor.     Nathaniel Man, MD 01/21/15 [id number]  Carmin Muskrat, MD 01/24/15 [phone]

## 2015-01-20 NOTE — H&P (Signed)
[facility] History and Physical  [name] [id number] DOB: Apr 20, 1950 DOA: 01/20/2015  Referring physician: ED physician PCP: Precious Reel, MD  Specialists:   Chief Complaint: left arm pain and left hand numbness  HPI: [name] is a 64 y.o. male with PMH of ischemic extremity (origin occlusion of the left axillary to brachial artery graft), s/p of left axillobrachial bypass graft by Dr. Juanda Crumble fields in February 2016, s/p of left axillo- brachial bypass-saphenous vein following thrombectomy on November 20 2014, atrial fibrillation on Coumadin, hypertension, hyperlipidemia, COPD, depression, stroke, amaurosis fugax of left eye, who presents with left arm pain and left hand numbness.  He has been doing well until today when he started having left hand numbness and pain in left arm. He states that he had surgery in arm due to decreased blood flow. Now he feels the same. His left hand is cold. No edema or weakness. Patient does not have fever, chills, chest pain, abdominal pain, diarrhea, symptoms of UTI or unilateral weakness. No vaginal hearing loss.   In ED, patient was found to haveINR 2.24, WBC 8.6, temperature normal, no tachycardia, pending BMP. Patient's admitted to inpatient for further interventional treatment. Vascular surgeon, Dr. Donnetta Hutching was consulted by EDP, will see in AM  Where does patient live?   At home  Can patient participate in ADLs?  Yes    Review of Systems:  General: no fevers, chills, no changes in body weight, has fatigue HEENT: no blurry vision, hearing changes or sore throat Pulm: no dyspnea, coughing, wheezing CV: no chest pain, palpitations Abd: no nausea, vomiting, abdominal pain, diarrhea, constipation GU: no dysuria, burning on urination, increased urinary frequency, hematuria  Ext: no leg edema. Has left hand numbness and left arm pain. Neuro: no unilateral weakness, numbness, or tingling, no vision change or hearing loss Skin: no  rash MSK: No muscle spasm, no deformity, no limitation of range of movement in spin Heme: No easy bruising.  Travel history: No recent long distant travel.  Allergy:  Allergies  Allergen Reactions  . Aspirin Nausea And Vomiting    Past Medical History  Diagnosis Date  . Atrial fibrillation ([place])   . Hypertension   . Hyperlipidemia   . COPD (chronic obstructive pulmonary disease) ([place])   . Amaurosis fugax of left eye     history of   . Hx of echocardiogram     Echo (07/09/13):  Mild LVH, EF 55-60%, MAC, mild MR, severe LAE, mod RAE  . Renal insufficiency   . Stroke [facility])     Past Surgical History  Procedure Laterality Date  . Embolectomy Left 04/05/2014    Procedure: THROMBO ENDARTERECTOMY OF LEFT BRACHIAL, RADIAL AND ULNAR ARTERY,  Left Radial artery cut down and radial artery thrombectomy.;  Surgeon: Elam Dutch, MD;  Location: [facility] OR;  Service: Vascular;  Laterality: Left;  . Patch angioplasty Left 04/05/2014    Procedure: LEFT ARM VEIN PATCH ANGIOPLASTY;  Surgeon: Elam Dutch, MD;  Location: [place];  Service: Vascular;  Laterality: Left;  . Arch aortogram N/A 04/05/2014    Procedure: ARCH AORTOGRAM, FIRST ORDER CATHETERIZATION LEFT SUBCLAVIAN ARTERY;  Surgeon: Elam Dutch, MD;  Location: [place];  Service: Vascular;  Laterality: N/A;  . Axillary-femoral bypass graft Left 04/08/2014    Procedure: LEFT AXILLARY ARTERY TO BRACHIAL ARTERY BYPASS USING NON REVERSE LEFT GREATER SAPHENOUS VEIN ,LIGATION OF LEFT AXILLARY ARTERY ANEURYSM;  Surgeon: Elam Dutch, MD;  Location: [place];  Service: Vascular;  Laterality: Left;  . Vein harvest Left 04/08/2014    Procedure: LEFT GREATER SAPHENOUS VEIN HARVEST;  Surgeon: Elam Dutch, MD;  Location: [place];  Service: Vascular;  Laterality: Left;  . Thrombectomy brachial artery Left 11/20/2014    Procedure: 1.  Thrombectomy Left Axilo-Brachial Bypass  2.  Thromboendarterecotmy of Left Brachial Artery with Fogarty Thrombectomy of  Radial and Ulnar Arteries with Dacron patch angioplasty Left Brachial Artery. 3. Intraoperative  Arteriogram times four.;  Surgeon: Mal Misty, MD;  Location: [place];  Service: Vascular;  Laterality: Left;    Social History:  reports that he quit smoking about 8 weeks ago. His smoking use included Cigarettes. He has a 90 pack-year smoking history. He has never used smokeless tobacco. He reports that he does not drink alcohol or use illicit drugs.  Family History:  Family History  Problem Relation Age of Onset  . Hypertension Mother   . Cancer Mother   . Cancer Father     gastric cancer  . Cancer Sister   . Early death Neg Hx   . Heart disease Neg Hx   . Hyperlipidemia Neg Hx   . Kidney disease Neg Hx   . Stroke Neg Hx      Prior to Admission medications   Medication Sig Start Date End Date Taking? Authorizing Provider  albuterol (PROVENTIL HFA;VENTOLIN HFA) 108 (90 BASE) MCG/ACT inhaler Inhale 2 puffs into the lungs every 6 (six) hours as needed for wheezing or shortness of breath.   Yes Historical Provider, MD  atorvastatin (LIPITOR) 40 MG tablet Take 1 tablet (40 mg total) by mouth daily. 09/26/14  Yes Lelon Perla, MD  clonazePAM (KLONOPIN) 1 MG tablet Take 1 tablet (1 mg total) by mouth 3 (three) times daily. 01/29/13  Yes Janith Lima, MD  diltiazem (DILACOR XR) 240 MG 24 hr capsule Take 1 capsule (240 mg total) by mouth daily. 09/26/14  Yes Lelon Perla, MD  gabapentin (NEURONTIN) 100 MG capsule Take 1 capsule (100 mg total) by mouth at bedtime. 07/11/13  Yes Janith Lima, MD  metoprolol (LOPRESSOR) 100 MG tablet Take 1 tablet (100 mg total) by mouth 2 (two) times daily. 10/18/14  Yes Lelon Perla, MD  oxyCODONE-acetaminophen (PERCOCET/ROXICET) 5-325 MG tablet Take 1-2 tablets by mouth every 4 (four) hours as needed for moderate pain. 11/25/14  Yes Samantha J Rhyne, PA-C  sertraline (ZOLOFT) 50 MG tablet Take 1 tablet (50 mg total) by mouth at bedtime. Patient taking  differently: Take 50 mg by mouth at bedtime as needed.  07/11/13  Yes Janith Lima, MD  warfarin (COUMADIN) 5 MG tablet Take 1 tablet (5 mg total) by mouth daily at 6 PM. Take as directed.  Dose may vary pending INR 11/26/14  Yes Samantha J Rhyne, PA-C  acetaminophen (TYLENOL) 500 MG tablet Take 1,000 mg by mouth 2 (two) times daily.    Historical Provider, MD    Physical Exam: Filed Vitals:   01/20/15 1821 01/20/15 2253 01/20/15 2315  BP: 132/100 140/105 127/89  Pulse: 77 80 68  Temp: 98.2 F (36.8 C)    TempSrc: Oral    Resp: 18 16   SpO2: 98% 99% 99%   General: Not in acute distress HEENT:       Eyes: PERRL, EOMI, no scleral icterus.       ENT: No discharge from the ears and nose, no pharynx injection, no tonsillar enlargement.        Neck: No  JVD, no bruit, no mass felt. Heme: No neck lymph node enlargement. Cardiac: S1/S2, RRR, No murmurs, No gallops or rubs. Pulm: No rales, wheezing, rhonchi or rubs. Abd: Soft, nondistended, nontender, no rebound pain, no organomegaly, BS present. Ext: No pitting leg edema bilaterally. 2+DP/PT pulse bilaterally. Has faint brachial pulse in left arm. Left hand is cool. Musculoskeletal: No joint deformities, No joint redness or warmth, no limitation of ROM in spin. Skin: No rashes.  Neuro: Alert, oriented X3, cranial nerves II-XII grossly intact, muscle strength 5/5 in all extremities, sensation to light touch decreased in left hand. Brachial reflex 1+ bilaterally.  Psych: Patient is not psychotic, no suicidal or hemocidal ideation.  Labs on Admission:  Basic Metabolic Panel:  Recent Labs Lab 01/20/15 2251  NA 138  K 3.7  CL 104  CO2 25  GLUCOSE 104*  BUN 13  CREATININE 0.85  CALCIUM 9.7   Liver Function Tests: No results for input(s): AST, ALT, ALKPHOS, BILITOT, PROT, ALBUMIN in the last 168 hours. No results for input(s): LIPASE, AMYLASE in the last 168 hours. No results for input(s): AMMONIA in the last 168  hours. CBC:  Recent Labs Lab 01/20/15 2251  WBC 8.6  NEUTROABS 6.0  HGB 14.9  HCT 43.7  MCV 89.7  PLT 323   Cardiac Enzymes: No results for input(s): CKTOTAL, CKMB, CKMBINDEX, TROPONINI in the last 168 hours.  BNP (last 3 results) No results for input(s): BNP in the last 8760 hours.  ProBNP (last 3 results) No results for input(s): PROBNP in the last 8760 hours.  CBG: No results for input(s): GLUCAP in the last 168 hours.  Radiological Exams on Admission: No results found.  EKG: Not done in ED, will get one.   Assessment/Plan Principal Problem:   Left arm pain Active Problems:   HYPERCHOLESTEROLEMIA-PURE   HYPERTENSION, BENIGN ESSENTIAL   Atrial fibrillation (HCC)   COPD (chronic obstructive pulmonary disease) (HCC)   Long term (current) use of anticoagulants   Depression with anxiety   Ischemia of extremity   PAD (peripheral artery disease) (HCC)   Numbness  Left arm pain and left hand numbness: most likely due to ischemia in left arm. Potential differential diagnosis is stroke, which is less likely given no weakness and that patient has arm pain. Vascular surgery was consulted by EDP, Dr. Donnetta Hutching will see patient in morning -will admit to tele bed -f/u vascular surgeons recommendation -Switch coumadin to IV heparin as below in case pt needs procedure -INR/PTT/type & screen -check upper extremity arterial duplex US -pain control: When necessary Percocet and morphine  HLD: Last LDL was 82 on 11/25/58  -Continue home medications: Lipitor  HTN: -metoprolol, Diltiazem  Atrial Fibrillation: CHA2DS2-VASc Score is 5, needs oral anticoagulation. Patient is on Coumadin at home. INR is 2.24 on admission. Heart rate is well controlled. -switch Coumadin to IV heparin as above -Continue metoprolol, Diltiazem  COPD (chronic obstructive pulmonary disease) ([place]): stable -prn Albuterol inhaler  Depression and anxiety: Stable, no suicidal or homicidal  ideations. -Continue home medications: Zoloft  DVT ppx:  On IV Heparin   Code Status: Full code Family Communication:   Yes, patient's  son at bed side Disposition Plan: Admit to inpatient   Date of Service 01/20/2015    Ivor Costa [facility] Pager [phone]  If 7PM-7AM, please contact night-coverage [email or web address] Password [facility] 01/20/2015, 11:54 PM

## 2015-01-21 ENCOUNTER — Inpatient Hospital Stay (HOSPITAL_COMMUNITY): Payer: Medicare Other

## 2015-01-21 DIAGNOSIS — M79602 Pain in left arm: Secondary | ICD-10-CM

## 2015-01-21 LAB — CBC
HEMATOCRIT: 43.7 % (ref 39.0–52.0)
HEMOGLOBIN: 15.1 g/dL (ref 13.0–17.0)
MCH: 31.1 pg (ref 26.0–34.0)
MCHC: 34.6 g/dL (ref 30.0–36.0)
MCV: 90.1 fL (ref 78.0–100.0)
Platelets: 325 10*3/uL (ref 150–400)
RBC: 4.85 MIL/uL (ref 4.22–5.81)
RDW: 12.7 % (ref 11.5–15.5)
WBC: 8.4 10*3/uL (ref 4.0–10.5)

## 2015-01-21 LAB — PROTIME-INR
INR: 2.36 — AB (ref 0.00–1.49)
INR: 2.36 — AB (ref 0.00–1.49)
INR: 1.53 — ABNORMAL HIGH (ref 0.00–1.49)
PROTHROMBIN TIME: 25.6 s — AB (ref 11.6–15.2)
PROTHROMBIN TIME: 25.6 s — AB (ref 11.6–15.2)
PROTHROMBIN TIME: 18.5 s — AB (ref 11.6–15.2)

## 2015-01-21 LAB — COMPREHENSIVE METABOLIC PANEL
ALBUMIN: 3.9 g/dL (ref 3.5–5.0)
ALK PHOS: 65 U/L (ref 38–126)
ALT: 18 U/L (ref 17–63)
ANION GAP: 7 (ref 5–15)
AST: 18 U/L (ref 15–41)
BILIRUBIN TOTAL: 1.2 mg/dL (ref 0.3–1.2)
BUN: 14 mg/dL (ref 6–20)
CALCIUM: 9.3 mg/dL (ref 8.9–10.3)
CO2: 27 mmol/L (ref 22–32)
Chloride: 105 mmol/L (ref 101–111)
Creatinine, Ser: 0.84 mg/dL (ref 0.61–1.24)
Glucose, Bld: 93 mg/dL (ref 65–99)
POTASSIUM: 3.7 mmol/L (ref 3.5–5.1)
Sodium: 139 mmol/L (ref 135–145)
TOTAL PROTEIN: 6.5 g/dL (ref 6.5–8.1)

## 2015-01-21 LAB — GLUCOSE, CAPILLARY: GLUCOSE-CAPILLARY: 95 mg/dL (ref 65–99)

## 2015-01-21 LAB — APTT: aPTT: 47 seconds — ABNORMAL HIGH (ref 24–37)

## 2015-01-21 MED ORDER — ALBUTEROL SULFATE (2.5 MG/3ML) 0.083% IN NEBU: 2.5000 mg | INHALATION_SOLUTION | Freq: Four times a day (QID) | RESPIRATORY_TRACT | Status: DC | PRN

## 2015-01-21 MED ORDER — VITAMIN K1 10 MG/ML IJ SOLN
5.0000 mg | Freq: Once | INTRAMUSCULAR | Status: AC
Start: 2015-01-21 — End: 2015-01-21
  Administered 2015-01-21: 5 mg via INTRAVENOUS
  Filled 2015-01-21 (×2): qty 0.5

## 2015-01-21 MED ORDER — SODIUM CHLORIDE 0.9 % IV SOLN: Freq: Once | INTRAVENOUS | Status: DC

## 2015-01-21 MED ORDER — HEPARIN (PORCINE) IN NACL 100-0.45 UNIT/ML-% IJ SOLN
1200.0000 [IU]/h | INTRAMUSCULAR | Status: DC
  Administered 2015-01-21: 1050 [IU]/h via INTRAVENOUS
  Filled 2015-01-21: qty 250

## 2015-01-21 NOTE — Progress Notes (Signed)
*  PRELIMINARY RESULTS* Vascular Ultrasound Upper Extremity Arterial Duplex of Left BPG has been completed.  Preliminary findings:  LEFT = 75-99% stenosis of the distal graft anastomosis in the left antecubital fossa. There is flow noted down to the distal left radial and through the mid left ulnar, but it is severely dampened. 50-74% stenosis is noted in the native proximal left radial artery.    Landry Mellow, RDMS, RVT  01/21/2015, 10:38 AM

## 2015-01-21 NOTE — Progress Notes (Signed)
[facility] PROGRESS NOTE  GWIN HETCHLER [id number] DOB: 1950/02/17 DOA: 01/20/2015 PCP: Precious Reel, MD  Length of stay: 1   Assessment/Plan: Principal Problem:   Left arm pain Active Problems:   HYPERCHOLESTEROLEMIA-PURE   HYPERTENSION, BENIGN ESSENTIAL   Atrial fibrillation ([place])   COPD (chronic obstructive pulmonary disease) ([place])   Long term (current) use of anticoagulants   Depression with anxiety   Ischemia of extremity   PAD (peripheral artery disease) (H[facility])   Numbness    History of present illness 64 y.o. male with PMH of ischemic extremity (origin occlusion of the left axillary to brachial artery graft), s/p of left axillobrachial bypass graft by Dr. Juanda Crumble fields in February 2016, s/p of left axillo- brachial bypass-saphenous vein following thrombectomy on November 20 2014, atrial fibrillation on Coumadin, hypertension, hyperlipidemia, COPD, depression, stroke, amaurosis fugax of left eye, who presents with left arm pain and left hand numbness.  He has been doing well until today when he started having left hand numbness and pain in left arm. He states that he had surgery in arm due to decreased blood flow. Now he feels the same. His left hand is cold. No edema or weakness. Patient does not have fever, chills, chest pain, abdominal pain, diarrhea, symptoms of UTI or unilateral weakness. No vaginal hearing loss.   In ED, patient was found to haveINR 2.24, WBC 8.6, temperature normal, no tachycardia, pending BMP. Patient's admitted to inpatient for further interventional treatment. Vascular surgeon, Dr. Donnetta Hutching was consulted by EDP,   Assessment and plan  Left arm pain and left hand numbness: most likely due to ischemia in left arm. Vascular surgery was consulted by EDP, Dr. Donnetta Hutching , he recommend arteriography today or tomorrow, patient may have severe atherosclerotic changes of his radial and ulnar artery, arterial Doppler this morning showed LEFT = 75-99%  stenosis of the distal graft anastomosis in the left antecubital fossa. There is flow noted down to the distal left radial and through the mid left ulnar, but it is severely dampened. 50-74% stenosis is noted in the native proximal left radial artery Vascular surgery to make further recommendations -Switch coumadin to IV heparin when INR less than 2.0 Patient received vitamin K to reverse Coumadin induced coagulopathy   HLD: Last LDL was 82 on 11/25/58  -Continue home medications: Lipitor  HTN: -metoprolol, Diltiazem  Atrial Fibrillation: CHA2DS2-VASc Score is 5, needs oral anticoagulation. Patient is on Coumadin at home. INR is 2.24 on admission. Heart rate is well controlled. -switch Coumadin to IV heparin when INR less than 2.0 -Continue metoprolol, Diltiazem Vitamin K to reverse INR Recheck INR this afternoon  COPD (chronic obstructive pulmonary disease) ([place]): stable -prn Albuterol inhaler  Depression and anxiety: Stable, no suicidal or homicidal ideations. -Continue home medications: Zoloft  DVT ppx: Coumadin   DVT prophylaxsis   Code Status:      Code Status Orders        Start     Ordered   01/20/15 2348  Full code   Continuous     01/20/15 2350      Family Communication: Discussed in detail with the patient, all imaging results, lab results explained to the patient   Disposition Plan:  As per vascular surgery     Consultants:  Vascular surgery  Procedures:  None  Antibiotics: Anti-infectives    None         HPI/Subjective: Pt with no change in INR over last 8 hrs. Still  too high for angiogram today.   Objective: Filed Vitals:   01/20/15 2315 01/21/15 0023 01/21/15 0059 01/21/15 0428  BP: 127/89 130/86 148/88 131/92  Pulse: 68 89 84 82  Temp:   98.3 F (36.8 C) 97.7 F (36.5 C)  TempSrc:   Oral Oral  Resp:  14 14 16   Weight:   76.749 kg (169 lb 3.2 oz)   SpO2: 99% 100% 99% 99%    Intake/Output Summary (Last 24 hours) at  01/21/15 1059 Last data filed at 01/21/15 0830  Gross per 24 hour  Intake      0 ml  Output      0 ml  Net      0 ml    Exam:  Cardiac: S1/S2, RRR, No murmurs, No gallops or rubs. Pulm: No rales, wheezing, rhonchi or rubs. Abd: Soft, nondistended, nontender, no rebound pain, no organomegaly, BS present. Ext: No pitting leg edema bilaterally. 2+DP/PT pulse bilaterally. Has faint brachial pulse in left arm. Left hand is cool. Musculoskeletal: No joint deformities, No joint redness or warmth, no limitation of ROM in spin. Skin: No rashes.  Neuro: Alert, oriented X3, cranial nerves II-XII grossly intact, muscle strength 5/5 in all extremities, sensation to light touch decreased in left hand. Brachial reflex 1+ bilaterally.   Data Review   Micro Results No results found for this or any previous visit (from the past 240 hour(s)).  Radiology Reports No results found.   CBC  Recent Labs Lab 01/20/15 2251 01/21/15 0438  WBC 8.6 8.4  HGB 14.9 15.1  HCT 43.7 43.7  PLT 323 325  MCV 89.7 90.1  MCH 30.6 31.1  MCHC 34.1 34.6  RDW 12.7 12.7  LYMPHSABS 2.1  --   MONOABS 0.5  --   EOSABS 0.0  --   BASOSABS 0.0  --     Chemistries   Recent Labs Lab 01/20/15 2251 01/21/15 0438  NA 138 139  K 3.7 3.7  CL 104 105  CO2 25 27  GLUCOSE 104* 93  BUN 13 14  CREATININE 0.85 0.84  CALCIUM 9.7 9.3  AST  --  18  ALT  --  18  ALKPHOS  --  65  BILITOT  --  1.2   ------------------------------------------------------------------------------------------------------------------ estimated creatinine clearance is 90.5 mL/min (by C-G formula based on Cr of 0.84). ------------------------------------------------------------------------------------------------------------------ No results for input(s): HGBA1C in the last 72 hours. ------------------------------------------------------------------------------------------------------------------ No results for input(s): CHOL, HDL,  LDLCALC, TRIG, CHOLHDL, LDLDIRECT in the last 72 hours. ------------------------------------------------------------------------------------------------------------------ No results for input(s): TSH, T4TOTAL, T3FREE, THYROIDAB in the last 72 hours.  Invalid input(s): FREET3 ------------------------------------------------------------------------------------------------------------------ No results for input(s): VITAMINB12, FOLATE, FERRITIN, TIBC, IRON, RETICCTPCT in the last 72 hours.  Coagulation profile  Recent Labs Lab 01/20/15 2251 01/21/15 0438  INR 2.24* 2.36*    No results for input(s): DDIMER in the last 72 hours.  Cardiac Enzymes No results for input(s): CKMB, TROPONINI, MYOGLOBIN in the last 168 hours.  Invalid input(s): CK ------------------------------------------------------------------------------------------------------------------ Invalid input(s): POCBNP   CBG:  Recent Labs Lab 01/21/15 0817  GLUCAP 95       Studies: No results found.    Lab Results  Component Value Date   HGBA1C 5.4 11/25/2014   HGBA1C  05/26/2007    5.5 (NOTE)   The ADA recommends the following therapeutic goals for glycemic   control related to Hgb A1C measurement:   Goal of Therapy:   < 7.0% Hgb A1C   Action Suggested:  > 8.0% Hgb A1C  Ref:  Diabetes Care, 22, Suppl. 1, 1999   Lab Results  Component Value Date   LDLCALC 82 11/25/2014   CREATININE 0.84 01/21/2015       Scheduled Meds: . atorvastatin  40 mg Oral Daily  . clonazePAM  1 mg Oral TID  . diltiazem  240 mg Oral Daily  . gabapentin  100 mg Oral QHS  . metoprolol  100 mg Oral BID  .  morphine injection  6 mg Intravenous Once  . sodium chloride  3 mL Intravenous Q12H   Continuous Infusions: . sodium chloride 75 mL/hr at 01/21/15 0109    Principal Problem:   Left arm pain Active Problems:   HYPERCHOLESTEROLEMIA-PURE   HYPERTENSION, BENIGN ESSENTIAL   Atrial fibrillation (HCC)   COPD (chronic  obstructive pulmonary disease) (HCC)   Long term (current) use of anticoagulants   Depression with anxiety   Ischemia of extremity   PAD (peripheral artery disease) (HCC)   Numbness    Time spent: 45 minutes   [facility] Pager [phone]. If 7PM-7AM, please contact night-coverage at [email or web address], password [facility] 01/21/2015, 10:59 AM  LOS: 1 day

## 2015-01-21 NOTE — Consult Note (Signed)
Pt with no change in INR over last 8 hrs.  Still too high for agram today.  Will reschedule for tomorrow.  Will give FFP today.  INR recheck tomorrow morning. NPO post midnight  Ruta Hinds, MD [facility] Office: [phone] Pager: [phone]

## 2015-01-21 NOTE — Consult Note (Signed)
Patient name: [name] MRN: [id number] DOB: November 13, 1950 Sex: male     Reason for referral:  Chief Complaint  Patient presents with  . Numbness    HISTORY OF PRESENT ILLNESS: Patient is a pleasant 64 year old gentleman with the history of left arm ischemia. He presented to the emergency room last night with some numbness and symptoms which were much milder than her prior events but was concerned. Of was called by the emergency room at approximately 11 PM was told that he had palpable pulse motor and sensory intact but was concerned about his numbness and was to be admitted by hospitalist service. This morning he is comfortable. Reports that his hand is not normal but is not have the level of ischemia to his prior presentations. He has had some persistent numbness since his initial surgery in February 2016. A time he had presented with the occlusion of his brachial artery. He underwent a saphenous vein bypass from his left axillary artery to his brachial artery with vein harvested from his left leg. He did well and then re-presented in October of this year with occlusion of the graft. He underwent thrombectomy of the graft and also had a thrombectomy of his radial and ulnar arteries. This was found to be sclerotic at that time. He had a Dacron patch of his brachial artery at that time. He has been on chronic Coumadin therapy related to this second event and has had good control of his anticoagulation. He reports that yesterday he noted some numbness and tingling in his hand. This improved throughout the day but was not completely back to normal and therefore he presented.  Past Medical History  Diagnosis Date  . Atrial fibrillation ([place])   . Hypertension   . Hyperlipidemia   . COPD (chronic obstructive pulmonary disease) ([place])   . Amaurosis fugax of left eye     history of   . Hx of echocardiogram     Echo (07/09/13):  Mild LVH, EF 55-60%, MAC, mild MR, severe LAE, mod RAE  .  Renal insufficiency   . Stroke [facility])     Past Surgical History  Procedure Laterality Date  . Embolectomy Left 04/05/2014    Procedure: THROMBO ENDARTERECTOMY OF LEFT BRACHIAL, RADIAL AND ULNAR ARTERY,  Left Radial artery cut down and radial artery thrombectomy.;  Surgeon: Elam Dutch, MD;  Location: [facility] OR;  Service: Vascular;  Laterality: Left;  . Patch angioplasty Left 04/05/2014    Procedure: LEFT ARM VEIN PATCH ANGIOPLASTY;  Surgeon: Elam Dutch, MD;  Location: [place];  Service: Vascular;  Laterality: Left;  . Arch aortogram N/A 04/05/2014    Procedure: ARCH AORTOGRAM, FIRST ORDER CATHETERIZATION LEFT SUBCLAVIAN ARTERY;  Surgeon: Elam Dutch, MD;  Location: [place];  Service: Vascular;  Laterality: N/A;  . Axillary-femoral bypass graft Left 04/08/2014    Procedure: LEFT AXILLARY ARTERY TO BRACHIAL ARTERY BYPASS USING NON REVERSE LEFT GREATER SAPHENOUS VEIN ,LIGATION OF LEFT AXILLARY ARTERY ANEURYSM;  Surgeon: Elam Dutch, MD;  Location: [place];  Service: Vascular;  Laterality: Left;  . Vein harvest Left 04/08/2014    Procedure: LEFT GREATER SAPHENOUS VEIN HARVEST;  Surgeon: Elam Dutch, MD;  Location: [place];  Service: Vascular;  Laterality: Left;  . Thrombectomy brachial artery Left 11/20/2014    Procedure: 1.  Thrombectomy Left Axilo-Brachial Bypass  2.  Thromboendarterecotmy of Left Brachial Artery with Fogarty Thrombectomy of Radial and Ulnar Arteries with Dacron patch angioplasty Left Brachial  Artery. 3. Intraoperative  Arteriogram times four.;  Surgeon: Mal Misty, MD;  Location: [facility] OR;  Service: Vascular;  Laterality: Left;    Social History   Social History  . Marital Status: Divorced    Spouse Name: N/A  . Number of Children: N/A  . 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 Smoking status: Former Smoker -- 2.00 packs/day for 45 years    Types: Cigarettes    Quit date: 11/25/2014  . Smokeless  tobacco: Never Used  . Alcohol Use: No  . Drug Use: No  . Sexual Activity: Not Currently   Other Topics Concern  . Not on file   Social History Narrative    Family History  Problem Relation Age of Onset  . Hypertension Mother   . Cancer Mother   . Cancer Father     gastric cancer  . Cancer Sister   . Early death Neg Hx   . Heart disease Neg Hx   . Hyperlipidemia Neg Hx   . Kidney disease Neg Hx   . Stroke Neg Hx     Allergies as of 01/20/2015 - Review Complete 01/20/2015  Allergen Reaction Noted  . Aspirin Nausea And Vomiting 11/26/2009    No current facility-administered medications on file prior to encounter.   Current Outpatient Prescriptions on File Prior to Encounter  Medication Sig Dispense Refill  . albuterol (PROVENTIL HFA;VENTOLIN HFA) 108 (90 BASE) MCG/ACT inhaler Inhale 2 puffs into the lungs every 6 (six) hours as needed for wheezing or shortness of breath.    Marland Kitchen atorvastatin (LIPITOR) 40 MG tablet Take 1 tablet (40 mg total) by mouth daily. 90 tablet 3  . clonazePAM (KLONOPIN) 1 MG tablet Take 1 tablet (1 mg total) by mouth 3 (three) times daily. 75 tablet 2  . diltiazem (DILACOR XR) 240 MG 24 hr capsule Take 1 capsule (240 mg total) by mouth daily. 90 capsule 3  . gabapentin (NEURONTIN) 100 MG capsule Take 1 capsule (100 mg total) by mouth at bedtime.    . metoprolol (LOPRESSOR) 100 MG tablet Take 1 tablet (100 mg total) by mouth 2 (two) times daily. 180 tablet 1  . oxyCODONE-acetaminophen (PERCOCET/ROXICET) 5-325 MG tablet Take 1-2 tablets by mouth every 4 (four) hours as needed for moderate pain. 30 tablet 0  . sertraline (ZOLOFT) 50 MG tablet Take 1 tablet (50 mg total) by mouth at bedtime. (Patient taking differently: Take 50 mg by mouth at bedtime as needed. )    . warfarin (COUMADIN) 5 MG tablet Take 1 tablet (5 mg total) by mouth daily at 6 PM. Take as directed.  Dose may vary pending INR    . acetaminophen (TYLENOL) 500 MG tablet Take 1,000 mg by mouth  2 (two) times daily.       REVIEW OF SYSTEMS: Reviewed in his history and physical with no changes  PHYSICAL EXAMINATION:  General: The patient is a well-nourished male, in no acute distress. Vital signs are BP 131/92 mmHg  Pulse 82  Temp(Src) 97.7 F (36.5 C) (Oral)  Resp 16  Wt 169 lb 3.2 oz (76.749 kg)  SpO2 99% Pulmonary: There is a good air exchange Abdomen: Soft and non-tender  Musculoskeletal: There are no major deformities.  There is no significant extremity pain. Neurologic: No focal weakness or paresthesias are detected, Skin: There are no ulcer or rashes noted. Psychiatric: The patient has normal affect. Cardiovascular: He has  an easily palpable graft pulse in his mid upper arm. Faint pulse at the antecubital space and no pulses at the radial or ulnar arteries. Does have dampened monophasic Doppler flow at the radial and ulnar arteries  INR is 2.3.  Impression and Plan:  Recurrent ischemia of his left arm. Not profound as his prior to presentations in February and October. Discussed this at length with the patient. Recommend arteriography for further evaluation. Dr. Evelena Leyden note in October noted that he did have severe atherosclerotic changes of his radial and ulnar artery as well. May need a jump graft past the brachial artery anastomosis. Will keep nothing by mouth. Will give vitamin K to reverse INR and plan arteriogram later today and no surgery for probable for tomorrow    Early, Sherren Mocha [facility] [place] Office: [phone]

## 2015-01-21 NOTE — Progress Notes (Signed)
Utilization review completed.  

## 2015-01-21 NOTE — Progress Notes (Signed)
PT Cancellation Note  Patient Details Name: [name] MRN: [id number] DOB: Jun 28, 1950   Cancelled Treatment:    Reason Eval/Treat Not Completed: Patient declined, no reason specified Pt reports feeling tired, cold and has a HA. Politely declines PT evaluation today. Will follow up next available time.Reports possible surgery today or tomorrow?   Marguarite Arbour A Rayetta Veith 01/21/2015, 11:16 AM Wray Kearns, PT, DPT [phone]

## 2015-01-21 NOTE — Progress Notes (Signed)
ANTICOAGULATION CONSULT NOTE - Follow Up Consult  Pharmacy Consult for Heparin Indication: atrial fibrillation  Allergies  Allergen Reactions  . Aspirin Nausea And Vomiting    Patient Measurements: Weight: 169 lb 3.2 oz (76.749 kg) Heparin Dosing Weight: 76.7 kg  Vital Signs: Temp: 97.8 F (36.6 C) (12/13 1656) Temp Source: Oral (12/13 1656) BP: 152/99 mmHg (12/13 1656) Pulse Rate: 87 (12/13 1656)  Labs:  Recent Labs  01/20/15 2251 01/20/15 2355 01/21/15 0438 01/21/15 1226 01/21/15 1630  HGB 14.9  --  15.1  --   --   HCT 43.7  --  43.7  --   --   PLT 323  --  325  --   --   APTT  --  47*  --   --   --   LABPROT 24.6*  --  25.6* 25.6* 18.5*  INR 2.24*  --  2.36* 2.36* 1.53*  CREATININE 0.85  --  0.84  --   --     Estimated Creatinine Clearance: 90.5 mL/min (by C-G formula based on Cr of 0.84).  Assessment: Admitted with LUE numbness (h/o LUE bypass grafts)   Anticoagulation:  Coumadin for Afib. INR on admit 2.36> Vit K 5mg  iv x1 12/13 am. INR down to 1.53. Arteriogram delayed until tomorrow. Start IV heparin. Coumadin on hold. -Home Coumadin 5 mg daily  Goal of Therapy:  Heparin level 0.3-0.7 units/ml Monitor platelets by anticoagulation protocol: Yes   Plan:  Arteriogram tomorrow Start IV heparin (no bolus) at 1050 units/hr Check HL in 6 hrs. Daily HL and CBC   Sitlaly Gudiel S. Alford Highland, PharmD, BCPS Clinical Staff Pharmacist Pager [phone]  Eilene Ghazi Stillinger 01/21/2015,5:13 PM

## 2015-01-21 NOTE — Progress Notes (Signed)
PT Cancellation Note  Patient Details Name: [name] MRN: [id number] DOB: [birth year]   Cancelled Treatment:    Reason Eval/Treat Not Completed: Patient at procedure or test/unavailable Pt off floor. Will follow up next available time to perform PT evaluation.  Marguarite Arbour A Natividad Schlosser 01/21/2015, 9:46 AM Wray Kearns, PT, DPT [phone]

## 2015-01-22 ENCOUNTER — Encounter (HOSPITAL_COMMUNITY)
Admission: EM | Discharge status: Against Medical Advice | Payer: Self-pay | Source: Home / Self Care | Attending: Internal Medicine

## 2015-01-22 DIAGNOSIS — I998 Other disorder of circulatory system: Secondary | ICD-10-CM

## 2015-01-22 HISTORY — PX: PERIPHERAL VASCULAR CATHETERIZATION: SHX172C

## 2015-01-22 LAB — COMPREHENSIVE METABOLIC PANEL
ALK PHOS: 66 U/L (ref 38–126)
ALT: 18 U/L (ref 17–63)
ANION GAP: 6 (ref 5–15)
AST: 19 U/L (ref 15–41)
Albumin: 3.7 g/dL (ref 3.5–5.0)
BILIRUBIN TOTAL: 1.2 mg/dL (ref 0.3–1.2)
BUN: 13 mg/dL (ref 6–20)
CALCIUM: 9.1 mg/dL (ref 8.9–10.3)
CO2: 27 mmol/L (ref 22–32)
CREATININE: 0.89 mg/dL (ref 0.61–1.24)
Chloride: 106 mmol/L (ref 101–111)
Glucose, Bld: 98 mg/dL (ref 65–99)
Potassium: 3.6 mmol/L (ref 3.5–5.1)
Sodium: 139 mmol/L (ref 135–145)
TOTAL PROTEIN: 6.4 g/dL — AB (ref 6.5–8.1)

## 2015-01-22 LAB — HEPARIN LEVEL (UNFRACTIONATED)
HEPARIN UNFRACTIONATED: 0.24 [IU]/mL — AB (ref 0.30–0.70)
HEPARIN UNFRACTIONATED: 0.39 [IU]/mL (ref 0.30–0.70)
Heparin Unfractionated: <0.1 IU/mL — ABNORMAL LOW (ref 0.30–0.70)

## 2015-01-22 LAB — PREPARE FRESH FROZEN PLASMA
UNIT DIVISION: 0
Unit division: 0

## 2015-01-22 LAB — CBC
HCT: 41.7 % (ref 39.0–52.0)
Hemoglobin: 14.4 g/dL (ref 13.0–17.0)
MCH: 31.4 pg (ref 26.0–34.0)
MCHC: 34.5 g/dL (ref 30.0–36.0)
MCV: 90.8 fL (ref 78.0–100.0)
PLATELETS: 286 10*3/uL (ref 150–400)
RBC: 4.59 MIL/uL (ref 4.22–5.81)
RDW: 12.9 % (ref 11.5–15.5)
WBC: 7.2 10*3/uL (ref 4.0–10.5)

## 2015-01-22 LAB — PROTIME-INR
INR: 1.33 (ref 0.00–1.49)
Prothrombin Time: 16.6 seconds — ABNORMAL HIGH (ref 11.6–15.2)

## 2015-01-22 LAB — GLUCOSE, CAPILLARY: Glucose-Capillary: 82 mg/dL (ref 65–99)

## 2015-01-22 LAB — TYPE AND SCREEN
ABO/RH(D): A POS
ANTIBODY SCREEN: NEGATIVE

## 2015-01-22 SURGERY — AORTIC ARCH ANGIOGRAPHY

## 2015-01-22 MED ORDER — ACETAMINOPHEN 325 MG PO TABS: 325.0000 mg | ORAL_TABLET | ORAL | Status: DC | PRN

## 2015-01-22 MED ORDER — HEPARIN (PORCINE) IN NACL 100-0.45 UNIT/ML-% IJ SOLN: 1200.0000 [IU]/h | INTRAMUSCULAR | Status: DC

## 2015-01-22 MED ORDER — ACETAMINOPHEN 325 MG RE SUPP: 325.0000 mg | RECTAL | Status: DC | PRN

## 2015-01-22 MED ORDER — LIDOCAINE HCL (PF) 1 % IJ SOLN
INTRAMUSCULAR | Status: DC | PRN
  Administered 2015-01-22: 14:00:00

## 2015-01-22 MED ORDER — SERTRALINE HCL 50 MG PO TABS: 50.0000 mg | ORAL_TABLET | Freq: Every day | ORAL | Status: DC

## 2015-01-22 MED ORDER — LIDOCAINE HCL (PF) 1 % IJ SOLN
INTRAMUSCULAR | Status: AC
  Filled 2015-01-22: qty 30

## 2015-01-22 MED ORDER — LABETALOL HCL 5 MG/ML IV SOLN
10.0000 mg | INTRAVENOUS | Status: DC | PRN
  Administered 2015-01-22: 10 mg via INTRAVENOUS

## 2015-01-22 MED ORDER — HEPARIN (PORCINE) IN NACL 2-0.9 UNIT/ML-% IJ SOLN
INTRAMUSCULAR | Status: AC
  Filled 2015-01-22: qty 1000

## 2015-01-22 MED ORDER — HYDRALAZINE HCL 20 MG/ML IJ SOLN: 5.0000 mg | INTRAMUSCULAR | Status: DC | PRN

## 2015-01-22 MED ORDER — SODIUM CHLORIDE 0.45 % IV SOLN: INTRAVENOUS | Status: DC

## 2015-01-22 MED ORDER — ONDANSETRON HCL 4 MG/2ML IJ SOLN: 4.0000 mg | Freq: Four times a day (QID) | INTRAMUSCULAR | Status: DC | PRN

## 2015-01-22 MED ORDER — METOPROLOL TARTRATE 1 MG/ML IV SOLN: 2.0000 mg | INTRAVENOUS | Status: DC | PRN

## 2015-01-22 MED ORDER — LABETALOL HCL 5 MG/ML IV SOLN
INTRAVENOUS | Status: AC
  Filled 2015-01-22: qty 4

## 2015-01-22 MED ORDER — IODIXANOL 320 MG/ML IV SOLN
INTRAVENOUS | Status: DC | PRN
  Administered 2015-01-22: 93 mL via INTRA_ARTERIAL

## 2015-01-22 SURGICAL SUPPLY — 10 items
CATH ANGIO 5F BER2 100CM (CATHETERS) ×2 IMPLANT
CATH ANGIO 5F PIGTAIL 100CM (CATHETERS) ×2 IMPLANT
COVER PRB 48X5XTLSCP FOLD TPE (BAG) ×1 IMPLANT
COVER PROBE 5X48 (BAG) ×1
KIT PV (KITS) ×2 IMPLANT
SHEATH PINNACLE 5F 10CM (SHEATH) ×2 IMPLANT
SYR MEDRAD MARK V 150ML (SYRINGE) ×2 IMPLANT
TRANSDUCER W/STOPCOCK (MISCELLANEOUS) ×2 IMPLANT
TRAY PV CATH (CUSTOM PROCEDURE TRAY) ×2 IMPLANT
WIRE HITORQ VERSACORE ST 145CM (WIRE) ×2 IMPLANT

## 2015-01-22 NOTE — Progress Notes (Signed)
Pt. Has left AMA  All personal belongings given to Pt.  Education discussed IV was d/c Tele d/c  Shirley Muscat 6:35 PM

## 2015-01-22 NOTE — Progress Notes (Signed)
Pt. Is wanting to walk AMA due to family issues. I have contacted the Social worker have not yet received a reply. Shirley Muscat 4:50 PM

## 2015-01-22 NOTE — Progress Notes (Signed)
Films reviewed.  Plan will be to do redo bypass from left mid to distal brachial artery using vein from right leg.  Will get vein map of leg.  Tentatively OR on Monday.  Will need to remain on heparin meanwhile.  Will start heparin 7 pm tonight.  Ruta Hinds

## 2015-01-22 NOTE — H&P (View-Only) (Signed)
Patient name: [name] MRN: [id number] DOB: [birth year] Sex: male     Reason for referral:  Chief Complaint  Patient presents with  . Numbness    HISTORY OF PRESENT ILLNESS: Patient is a pleasant 64 year old gentleman with the history of left arm ischemia. He presented to the emergency room last night with some numbness and symptoms which were much milder than her prior events but was concerned. Of was called by the emergency room at approximately 11 PM was told that he had palpable pulse motor and sensory intact but was concerned about his numbness and was to be admitted by hospitalist service. This morning he is comfortable. Reports that his hand is not normal but is not have the level of ischemia to his prior presentations. He has had some persistent numbness since his initial surgery in February 2016. A time he had presented with the occlusion of his brachial artery. He underwent a saphenous vein bypass from his left axillary artery to his brachial artery with vein harvested from his left leg. He did well and then re-presented in October of this year with occlusion of the graft. He underwent thrombectomy of the graft and also had a thrombectomy of his radial and ulnar arteries. This was found to be sclerotic at that time. He had a Dacron patch of his brachial artery at that time. He has been on chronic Coumadin therapy related to this second event and has had good control of his anticoagulation. He reports that yesterday he noted some numbness and tingling in his hand. This improved throughout the day but was not completely back to normal and therefore he presented.  Past Medical History  Diagnosis Date  . Atrial fibrillation ([place])   . Hypertension   . Hyperlipidemia   . COPD (chronic obstructive pulmonary disease) ([place])   . Amaurosis fugax of left eye     history of   . Hx of echocardiogram     Echo (07/09/13):  Mild LVH, EF 55-60%, MAC, mild MR, severe LAE, mod RAE  .  Renal insufficiency   . Stroke [facility])     Past Surgical History  Procedure Laterality Date  . Embolectomy Left 04/05/2014    Procedure: THROMBO ENDARTERECTOMY OF LEFT BRACHIAL, RADIAL AND ULNAR ARTERY,  Left Radial artery cut down and radial artery thrombectomy.;  Surgeon: Elam Dutch, MD;  Location: [facility] OR;  Service: Vascular;  Laterality: Left;  . Patch angioplasty Left 04/05/2014    Procedure: LEFT ARM VEIN PATCH ANGIOPLASTY;  Surgeon: Elam Dutch, MD;  Location: [place];  Service: Vascular;  Laterality: Left;  . Arch aortogram N/A 04/05/2014    Procedure: ARCH AORTOGRAM, FIRST ORDER CATHETERIZATION LEFT SUBCLAVIAN ARTERY;  Surgeon: Elam Dutch, MD;  Location: [place];  Service: Vascular;  Laterality: N/A;  . Axillary-femoral bypass graft Left 04/08/2014    Procedure: LEFT AXILLARY ARTERY TO BRACHIAL ARTERY BYPASS USING NON REVERSE LEFT GREATER SAPHENOUS VEIN ,LIGATION OF LEFT AXILLARY ARTERY ANEURYSM;  Surgeon: Elam Dutch, MD;  Location: [place];  Service: Vascular;  Laterality: Left;  . Vein harvest Left 04/08/2014    Procedure: LEFT GREATER SAPHENOUS VEIN HARVEST;  Surgeon: Elam Dutch, MD;  Location: [place];  Service: Vascular;  Laterality: Left;  . Thrombectomy brachial artery Left 11/20/2014    Procedure: 1.  Thrombectomy Left Axilo-Brachial Bypass  2.  Thromboendarterecotmy of Left Brachial Artery with Fogarty Thrombectomy of Radial and Ulnar Arteries with Dacron patch angioplasty Left Brachial  Artery. 3. Intraoperative  Arteriogram times four.;  Surgeon: Mal Misty, MD;  Location: [facility] OR;  Service: Vascular;  Laterality: Left;    Social History   Social History  . Marital Status: Divorced    Spouse Name: N/A  . Number of Children: N/A  . 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 Smoking status: Former Smoker -- 2.00 packs/day for 45 years    Types: Cigarettes    Quit date: 11/25/2014  . Smokeless  tobacco: Never Used  . Alcohol Use: No  . Drug Use: No  . Sexual Activity: Not Currently   Other Topics Concern  . Not on file   Social History Narrative    Family History  Problem Relation Age of Onset  . Hypertension Mother   . Cancer Mother   . Cancer Father     gastric cancer  . Cancer Sister   . Hayli Milligan death Neg Hx   . Heart disease Neg Hx   . Hyperlipidemia Neg Hx   . Kidney disease Neg Hx   . Stroke Neg Hx     Allergies as of 01/20/2015 - Review Complete 01/20/2015  Allergen Reaction Noted  . Aspirin Nausea And Vomiting 11/26/2009    No current facility-administered medications on file prior to encounter.   Current Outpatient Prescriptions on File Prior to Encounter  Medication Sig Dispense Refill  . albuterol (PROVENTIL HFA;VENTOLIN HFA) 108 (90 BASE) MCG/ACT inhaler Inhale 2 puffs into the lungs every 6 (six) hours as needed for wheezing or shortness of breath.    Marland Kitchen atorvastatin (LIPITOR) 40 MG tablet Take 1 tablet (40 mg total) by mouth daily. 90 tablet 3  . clonazePAM (KLONOPIN) 1 MG tablet Take 1 tablet (1 mg total) by mouth 3 (three) times daily. 75 tablet 2  . diltiazem (DILACOR XR) 240 MG 24 hr capsule Take 1 capsule (240 mg total) by mouth daily. 90 capsule 3  . gabapentin (NEURONTIN) 100 MG capsule Take 1 capsule (100 mg total) by mouth at bedtime.    . metoprolol (LOPRESSOR) 100 MG tablet Take 1 tablet (100 mg total) by mouth 2 (two) times daily. 180 tablet 1  . oxyCODONE-acetaminophen (PERCOCET/ROXICET) 5-325 MG tablet Take 1-2 tablets by mouth every 4 (four) hours as needed for moderate pain. 30 tablet 0  . sertraline (ZOLOFT) 50 MG tablet Take 1 tablet (50 mg total) by mouth at bedtime. (Patient taking differently: Take 50 mg by mouth at bedtime as needed. )    . warfarin (COUMADIN) 5 MG tablet Take 1 tablet (5 mg total) by mouth daily at 6 PM. Take as directed.  Dose may vary pending INR    . acetaminophen (TYLENOL) 500 MG tablet Take 1,000 mg by mouth  2 (two) times daily.       REVIEW OF SYSTEMS: Reviewed in his history and physical with no changes  PHYSICAL EXAMINATION:  General: The patient is a well-nourished male, in no acute distress. Vital signs are BP 131/92 mmHg  Pulse 82  Temp(Src) 97.7 F (36.5 C) (Oral)  Resp 16  Wt 169 lb 3.2 oz (76.749 kg)  SpO2 99% Pulmonary: There is a good air exchange Abdomen: Soft and non-tender  Musculoskeletal: There are no major deformities.  There is no significant extremity pain. Neurologic: No focal weakness or paresthesias are detected, Skin: There are no ulcer or rashes noted. Psychiatric: The patient has normal affect. Cardiovascular: He has  an easily palpable graft pulse in his mid upper arm. Faint pulse at the antecubital space and no pulses at the radial or ulnar arteries. Does have dampened monophasic Doppler flow at the radial and ulnar arteries  INR is 2.3.  Impression and Plan:  Recurrent ischemia of his left arm. Not profound as his prior to presentations in February and October. Discussed this at length with the patient. Recommend arteriography for further evaluation. Dr. Evelena Leyden note in October noted that he did have severe atherosclerotic changes of his radial and ulnar artery as well. May need a jump graft past the brachial artery anastomosis. Will keep nothing by mouth. Will give vitamin K to reverse INR and plan arteriogram later today and no surgery for probable for tomorrow    Nguyen Todorov, Sherren Mocha [facility] [place] Office: [phone]

## 2015-01-22 NOTE — Discharge Summary (Addendum)
Physician Discharge Summary  [name] MRN: [id number] DOB/AGE: 1950/10/21 64 y.o.  PCP: Precious Reel, MD   Admit date: 01/20/2015 Discharge date: 01/22/2015  Discharge Diagnoses:     Principal Problem:   Left arm pain Active Problems:   HYPERCHOLESTEROLEMIA-PURE   HYPERTENSION, BENIGN ESSENTIAL   Atrial fibrillation (HCC)   COPD (chronic obstructive pulmonary disease) (HCC)   Long term (current) use of anticoagulants   Depression with anxiety   Ischemia of extremity   PAD (peripheral artery disease) (HCC)   Numbness    Left AMA as patient states his son has a gun and he is not sure what he wants to do with it, and he has driven off in his car, his old mother is not safe and he needs to leave  Impossible for him to stay another 1 hs 30 mins  Patient denies any suicidal or homicidal ideation and is completely alert and oriented Status postcardiac catheterization Patient decided to leave AMA on 12/14 at 5:30 PM     Medication List    ASK your doctor about these medications        acetaminophen 500 MG tablet  Commonly known as:  TYLENOL  Take 1,000 mg by mouth 2 (two) times daily.     albuterol 108 (90 BASE) MCG/ACT inhaler  Commonly known as:  PROVENTIL HFA;VENTOLIN HFA  Inhale 2 puffs into the lungs every 6 (six) hours as needed for wheezing or shortness of breath.     atorvastatin 40 MG tablet  Commonly known as:  LIPITOR  Take 1 tablet (40 mg total) by mouth daily.     clonazePAM 1 MG tablet  Commonly known as:  KLONOPIN  Take 1 tablet (1 mg total) by mouth 3 (three) times daily.     diltiazem 240 MG 24 hr capsule  Commonly known as:  DILACOR XR  Take 1 capsule (240 mg total) by mouth daily.     gabapentin 100 MG capsule  Commonly known as:  NEURONTIN  Take 1 capsule (100 mg total) by mouth at bedtime.     metoprolol 100 MG tablet  Commonly known as:  LOPRESSOR  Take 1 tablet (100 mg total) by mouth 2 (two) times daily.      oxyCODONE-acetaminophen 5-325 MG tablet  Commonly known as:  PERCOCET/ROXICET  Take 1-2 tablets by mouth every 4 (four) hours as needed for moderate pain.     sertraline 50 MG tablet  Commonly known as:  ZOLOFT  Take 1 tablet (50 mg total) by mouth at bedtime.     warfarin 5 MG tablet  Commonly known as:  COUMADIN  Take 1 tablet (5 mg total) by mouth daily at 6 PM. Take as directed.  Dose may vary pending INR         Discharge Condition:  Discharge Instructions     Allergies  Allergen Reactions  . Aspirin Nausea And Vomiting      Disposition: 07-Left Against Medical Advice   Consults:   Vascular surgery   Significant Diagnostic Studies:  No results found.    Cardiac cath  Operative findings: #1 normal arch anatomy #2 patent left axillary to brachial artery bypass with occlusion of native brachial artery and distal reconstitution with patent but very small ulnar artery and patent but small radial artery with the radial artery occluding at the level of the wrist    Filed Weights   01/21/15 0059  Weight: 76.749 kg (169 lb 3.2  oz)     Microbiology: No results found for this or any previous visit (from the past 240 hour(s)).     Blood Culture No results found for: SDES, [place], CULT, REPTSTATUS    Labs: Results for orders placed or performed during the hospital encounter of 01/20/15 (from the past 48 hour(s))  CBC with Differential     Status: None   Collection Time: 01/20/15 10:51 PM  Result Value Ref Range   WBC 8.6 4.0 - 10.5 K/uL   RBC 4.87 4.22 - 5.81 MIL/uL   Hemoglobin 14.9 13.0 - 17.0 g/dL   HCT 43.7 39.0 - 52.0 %   MCV 89.7 78.0 - 100.0 fL   MCH 30.6 26.0 - 34.0 pg   MCHC 34.1 30.0 - 36.0 g/dL   RDW 12.7 11.5 - 15.5 %   Platelets 323 150 - 400 K/uL   Neutrophils Relative % 70 %   Neutro Abs 6.0 1.7 - 7.7 K/uL   Lymphocytes Relative 25 %   Lymphs Abs 2.1 0.7 - 4.0 K/uL   Monocytes Relative 5 %   Monocytes Absolute 0.5 0.1 -  1.0 K/uL   Eosinophils Relative 0 %   Eosinophils Absolute 0.0 0.0 - 0.7 K/uL   Basophils Relative 0 %   Basophils Absolute 0.0 0.0 - 0.1 K/uL  Protime-INR     Status: Abnormal   Collection Time: 01/20/15 10:51 PM  Result Value Ref Range   Prothrombin Time 24.6 (H) 11.6 - 15.2 seconds   INR 2.24 (H) 0.00 - 8.25  Basic metabolic panel     Status: Abnormal   Collection Time: 01/20/15 10:51 PM  Result Value Ref Range   Sodium 138 135 - 145 mmol/L   Potassium 3.7 3.5 - 5.1 mmol/L   Chloride 104 101 - 111 mmol/L   CO2 25 22 - 32 mmol/L   Glucose, Bld 104 (H) 65 - 99 mg/dL   BUN 13 6 - 20 mg/dL   Creatinine, Ser 0.85 0.61 - 1.24 mg/dL   Calcium 9.7 8.9 - 10.3 mg/dL   GFR calc non Af Amer >60 >60 mL/min   GFR calc Af Amer >60 >60 mL/min    Comment: (NOTE) The eGFR has been calculated using the CKD EPI equation. This calculation has not been validated in all clinical situations. eGFR's persistently <60 mL/min signify possible Chronic Kidney Disease.    Anion gap 9 5 - 15  APTT     Status: Abnormal   Collection Time: 01/20/15 11:55 PM  Result Value Ref Range   aPTT 47 (H) 24 - 37 seconds    Comment:        IF BASELINE aPTT IS ELEVATED, SUGGEST PATIENT RISK ASSESSMENT BE USED TO DETERMINE APPROPRIATE ANTICOAGULANT THERAPY.   Type and screen [place]     Status: None   Collection Time: 01/20/15 11:55 PM  Result Value Ref Range   ABO/RH(D) A POS    Antibody Screen NEG    Sample Expiration 01/23/2015   Comprehensive metabolic panel     Status: None   Collection Time: 01/21/15  4:38 AM  Result Value Ref Range   Sodium 139 135 - 145 mmol/L   Potassium 3.7 3.5 - 5.1 mmol/L   Chloride 105 101 - 111 mmol/L   CO2 27 22 - 32 mmol/L   Glucose, Bld 93 65 - 99 mg/dL   BUN 14 6 - 20 mg/dL   Creatinine, Ser 0.84 0.61 - 1.24 mg/dL   Calcium  9.3 8.9 - 10.3 mg/dL   Total Protein 6.5 6.5 - 8.1 g/dL   Albumin 3.9 3.5 - 5.0 g/dL   AST 18 15 - 41 U/L   ALT 18 17 - 63  U/L   Alkaline Phosphatase 65 38 - 126 U/L   Total Bilirubin 1.2 0.3 - 1.2 mg/dL   GFR calc non Af Amer >60 >60 mL/min   GFR calc Af Amer >60 >60 mL/min    Comment: (NOTE) The eGFR has been calculated using the CKD EPI equation. This calculation has not been validated in all clinical situations. eGFR's persistently <60 mL/min signify possible Chronic Kidney Disease.    Anion gap 7 5 - 15  CBC     Status: None   Collection Time: 01/21/15  4:38 AM  Result Value Ref Range   WBC 8.4 4.0 - 10.5 K/uL   RBC 4.85 4.22 - 5.81 MIL/uL   Hemoglobin 15.1 13.0 - 17.0 g/dL   HCT 43.7 39.0 - 52.0 %   MCV 90.1 78.0 - 100.0 fL   MCH 31.1 26.0 - 34.0 pg   MCHC 34.6 30.0 - 36.0 g/dL   RDW 12.7 11.5 - 15.5 %   Platelets 325 150 - 400 K/uL  Protime-INR     Status: Abnormal   Collection Time: 01/21/15  4:38 AM  Result Value Ref Range   Prothrombin Time 25.6 (H) 11.6 - 15.2 seconds   INR 2.36 (H) 0.00 - 1.49  Glucose, capillary     Status: None   Collection Time: 01/21/15  8:17 AM  Result Value Ref Range   Glucose-Capillary 95 65 - 99 mg/dL  Protime-INR     Status: Abnormal   Collection Time: 01/21/15 12:26 PM  Result Value Ref Range   Prothrombin Time 25.6 (H) 11.6 - 15.2 seconds   INR 2.36 (H) 0.00 - 1.49  Prepare fresh frozen plasma     Status: None   Collection Time: 01/21/15  2:07 PM  Result Value Ref Range   Unit Number [id number]    Blood Component Type THAWED PLASMA    Unit division 00    Status of Unit ISSUED,FINAL    Transfusion Status OK TO TRANSFUSE    Unit Number [id number]    Blood Component Type THAWED PLASMA    Unit division 00    Status of Unit ISSUED,FINAL    Transfusion Status OK TO TRANSFUSE   Protime-INR     Status: Abnormal   Collection Time: 01/21/15  4:30 PM  Result Value Ref Range   Prothrombin Time 18.5 (H) 11.6 - 15.2 seconds   INR 1.53 (H) 0.00 - 1.49  Protime-INR     Status: Abnormal   Collection Time: 01/22/15 12:29 AM  Result Value Ref Range    Prothrombin Time 16.6 (H) 11.6 - 15.2 seconds   INR 1.33 0.00 - 1.49  Comprehensive metabolic panel     Status: Abnormal   Collection Time: 01/22/15 12:29 AM  Result Value Ref Range   Sodium 139 135 - 145 mmol/L   Potassium 3.6 3.5 - 5.1 mmol/L   Chloride 106 101 - 111 mmol/L   CO2 27 22 - 32 mmol/L   Glucose, Bld 98 65 - 99 mg/dL   BUN 13 6 - 20 mg/dL   Creatinine, Ser 0.89 0.61 - 1.24 mg/dL   Calcium 9.1 8.9 - 10.3 mg/dL   Total Protein 6.4 (L) 6.5 - 8.1 g/dL   Albumin 3.7 3.5 - 5.0 g/dL   AST 19  15 - 41 U/L   ALT 18 17 - 63 U/L   Alkaline Phosphatase 66 38 - 126 U/L   Total Bilirubin 1.2 0.3 - 1.2 mg/dL   GFR calc non Af Amer >60 >60 mL/min   GFR calc Af Amer >60 >60 mL/min    Comment: (NOTE) The eGFR has been calculated using the CKD EPI equation. This calculation has not been validated in all clinical situations. eGFR's persistently <60 mL/min signify possible Chronic Kidney Disease.    Anion gap 6 5 - 15  Heparin level (unfractionated)     Status: Abnormal   Collection Time: 01/22/15 12:29 AM  Result Value Ref Range   Heparin Unfractionated <0.10 (L) 0.30 - 0.70 IU/mL    Comment:        IF HEPARIN RESULTS ARE BELOW EXPECTED VALUES, AND PATIENT DOSAGE HAS BEEN CONFIRMED, SUGGEST FOLLOW UP TESTING OF ANTITHROMBIN III LEVELS.   CBC     Status: None   Collection Time: 01/22/15 12:29 AM  Result Value Ref Range   WBC 7.2 4.0 - 10.5 K/uL   RBC 4.59 4.22 - 5.81 MIL/uL   Hemoglobin 14.4 13.0 - 17.0 g/dL   HCT 41.7 39.0 - 52.0 %   MCV 90.8 78.0 - 100.0 fL   MCH 31.4 26.0 - 34.0 pg   MCHC 34.5 30.0 - 36.0 g/dL   RDW 12.9 11.5 - 15.5 %   Platelets 286 150 - 400 K/uL  Glucose, capillary     Status: None   Collection Time: 01/22/15  6:15 AM  Result Value Ref Range   Glucose-Capillary 82 65 - 99 mg/dL  Heparin level (unfractionated)     Status: Abnormal   Collection Time: 01/22/15  9:50 AM  Result Value Ref Range   Heparin Unfractionated 0.24 (L) 0.30 - 0.70 IU/mL     Comment:        IF HEPARIN RESULTS ARE BELOW EXPECTED VALUES, AND PATIENT DOSAGE HAS BEEN CONFIRMED, SUGGEST FOLLOW UP TESTING OF ANTITHROMBIN III LEVELS.   Heparin level (unfractionated)     Status: None   Collection Time: 01/22/15 10:35 AM  Result Value Ref Range   Heparin Unfractionated 0.39 0.30 - 0.70 IU/mL    Comment:        IF HEPARIN RESULTS ARE BELOW EXPECTED VALUES, AND PATIENT DOSAGE HAS BEEN CONFIRMED, SUGGEST FOLLOW UP TESTING OF ANTITHROMBIN III LEVELS.      Lipid Panel     Component Value Date/Time   CHOL 143 11/25/2014 1334   TRIG 101 11/25/2014 1334   HDL 41 11/25/2014 1334   CHOLHDL 3.5 11/25/2014 1334   VLDL 20 11/25/2014 1334   LDLCALC 82 11/25/2014 1334   LDLDIRECT 148.7 12/13/2012 1514     Lab Results  Component Value Date   HGBA1C 5.4 11/25/2014   HGBA1C  05/26/2007    5.5 (NOTE)   The ADA recommends the following therapeutic goals for glycemic   control related to Hgb A1C measurement:   Goal of Therapy:   < 7.0% Hgb A1C   Action Suggested:  > 8.0% Hgb A1C   Ref:  Diabetes Care, 22, Suppl. 1, 1999     Lab Results  Component Value Date   LDLCALC 82 11/25/2014   CREATININE 0.89 01/22/2015     History of present illness 64 y.o. male with PMH of ischemic extremity (origin occlusion of the left axillary to brachial artery graft), s/p of left axillobrachial bypass graft by Dr. Juanda [place]rum[place] in February 2016, s/p of  left axillo- brachial bypass-saphenous vein following thrombectomy on November 20 2014, atrial fibrillation on Coumadin, hypertension, hyperlipidemia, COPD, depression, stroke, amaurosis fugax of left eye, who presents with left arm pain and left hand numbness.  He has been doing well until today when he started having left hand numbness and pain in left arm. He states that he had surgery in arm due to decreased blood flow. Now he feels the same. His left hand is cold. No edema or weakness. Patient does not have fever, chills, chest  pain, abdominal pain, diarrhea, symptoms of UTI or unilateral weakness. No vaginal hearing loss.   In ED, patient was found to haveINR 2.24, WBC 8.6, temperature normal, no tachycardia, pending BMP. Patient's admitted to inpatient for further interventional treatment. Vascular surgeon, Dr. Donnetta Hutching was consulted by EDP,   Assessment and plan  Left arm pain and left hand numbness: most likely due to ischemia in left arm. Vascular surgery was consulted by EDP, Dr. Donnetta Hutching , he recommend arteriography today , patient may have severe atherosclerotic changes of his radial and ulnar artery, arterial Doppler this morning showed LEFT = 75-99% stenosis of the distal graft anastomosis in the left antecubital fossa. There is flow noted down to the distal left radial and through the mid left ulnar, but it is severely dampened. 50-74% stenosis is noted in the native proximal left radial artery Vascular surgery to take him to OR  -Switch coumadin to IV heparin when INR less than 2.0, INR 1.33 Patient received vitamin K to reverse Coumadin induced coagulopathy   HLD: Last LDL was 82 on 11/25/58  -Continue home medications: Lipitor  HTN: -metoprolol, Diltiazem  Atrial Fibrillation: CHA2DS2-VASc Score is 5, needs oral anticoagulation. Patient is on Coumadin at home. INR is 2.24 on admission.now 1.33, Heart rate is well controlled. -switch Coumadin to IV heparin when INR less than 2.0 -Continue metoprolol, Diltiazem Vitamin K to reverse INR restart coumadin when app with vascular    COPD (chronic obstructive pulmonary disease) ([place]): stable -prn Albuterol inhaler  Depression and anxiety: Stable, no suicidal or homicidal ideations. -Continue home medications: Zoloft,klonopin  DVT ppx: Coumadin, heparin     Discharge Exam:    Blood pressure 148/89, pulse 75, temperature 97.8 F (36.6 C), temperature source Oral, resp. rate 18, weight 76.749 kg (169 lb 3.2 oz), SpO2 99  %.        SignedReyne Dumas 01/22/2015, 5:44 PM        Time spent >45 mins

## 2015-01-22 NOTE — Progress Notes (Signed)
Spoke with [place] the social worker who said to contact MD to see if we can IVC for patient. Spoke with Allyson Sabal MD to get IVC- MD spoke with patient. Patient explained situation to Abrol, Abrol has allowed patient to walk AMA. Pt is currently on bedrest for having an Angio in the right groin. Shirley Muscat 5:13 PM

## 2015-01-22 NOTE — Progress Notes (Addendum)
[facility] PROGRESS NOTE  TRITON ALTICE [id number] DOB: 23-Feb-1950 DOA: 01/20/2015 PCP: Precious Reel, MD  Length of stay: 2   Assessment/Plan: Principal Problem:   Left arm pain Active Problems:   HYPERCHOLESTEROLEMIA-PURE   HYPERTENSION, BENIGN ESSENTIAL   Atrial fibrillation ([place])   COPD (chronic obstructive pulmonary disease) ([place])   Long term (current) use of anticoagulants   Depression with anxiety   Ischemia of extremity   PAD (peripheral artery disease) (HCC)   Numbness    History of present illness 64 y.o. male with PMH of ischemic extremity (origin occlusion of the left axillary to brachial artery graft), s/p of left axillobrachial bypass graft by Dr. Juanda [place]rum[place] in February 2016, s/p of left axillo- brachial bypass-saphenous vein following thrombectomy on November 20 2014, atrial fibrillation on Coumadin, hypertension, hyperlipidemia, COPD, depression, stroke, amaurosis fugax of left eye, who presents with left arm pain and left hand numbness.  He has been doing well until today when he started having left hand numbness and pain in left arm. He states that he had surgery in arm due to decreased blood flow. Now he feels the same. His left hand is cold. No edema or weakness. Patient does not have fever, chills, chest pain, abdominal pain, diarrhea, symptoms of UTI or unilateral weakness. No vaginal hearing loss.   In ED, patient was found to haveINR 2.24, WBC 8.6, temperature normal, no tachycardia, pending BMP. Patient's admitted to inpatient for further interventional treatment. Vascular surgeon, Dr. Donnetta Hutching was consulted by EDP,   Assessment and plan  Left arm pain and left hand numbness: most likely due to ischemia in left arm. Vascular surgery was consulted by EDP, Dr. Donnetta Hutching , he recommend arteriography today  , patient may have severe atherosclerotic changes of his radial and ulnar artery, arterial Doppler this morning showed LEFT = 75-99% stenosis  of the distal graft anastomosis in the left antecubital fossa. There is flow noted down to the distal left radial and through the mid left ulnar, but it is severely dampened. 50-74% stenosis is noted in the native proximal left radial artery Vascular surgery to take him to OR  -Switch coumadin to IV heparin when INR less than 2.0, INR 1.33 Patient received vitamin K to reverse Coumadin induced coagulopathy   HLD: Last LDL was 82 on 11/25/58  -Continue home medications: Lipitor  HTN: -metoprolol, Diltiazem  Atrial Fibrillation: CHA2DS2-VASc Score is 5, needs oral anticoagulation. Patient is on Coumadin at home. INR is 2.24 on admission.now 1.33, Heart rate is well controlled. -switch Coumadin to IV heparin when INR less than 2.0 -Continue metoprolol, Diltiazem Vitamin K to reverse INR restart coumadin when app with vascular    COPD (chronic obstructive pulmonary disease) ([place]): stable -prn Albuterol inhaler  Depression and anxiety: Stable, no suicidal or homicidal ideations. -Continue home medications: Zoloft,klonopin  DVT ppx: Coumadin, heparin   DVT prophylaxsis   Code Status:      Code Status Orders        Start     Ordered   01/20/15 2348  Full code   Continuous     01/20/15 2350      Family Communication: Discussed in detail with the patient, all imaging results, lab results explained to the patient   Disposition Plan:  As per vascular surgery     Consultants:  Vascular surgery  Procedures:  None  Antibiotics: Anti-infectives    None         HPI/Subjective: Npo  for surgery , tearful ,no pain in left arm  Objective: Filed Vitals:   01/21/15 1751 01/21/15 1938 01/22/15 0425 01/22/15 1041  BP: 118/77 130/84 123/80 159/113  Pulse: 72 70 65   Temp: 98 F (36.7 C) 97.9 F (36.6 C) 97.8 F (36.6 C)   TempSrc: Oral Oral Oral   Resp: 19 18 18    Weight:      SpO2: 100% 100% 100%     Intake/Output Summary (Last 24 hours) at 01/22/15  1042 Last data filed at 01/21/15 1938  Gross per 24 hour  Intake 355.33 ml  Output      0 ml  Net 355.33 ml    Exam:  Cardiac: S1/S2, RRR, No murmurs, No gallops or rubs. Pulm: No rales, wheezing, rhonchi or rubs. Abd: Soft, nondistended, nontender, no rebound pain, no organomegaly, BS present. Ext: No pitting leg edema bilaterally. 2+DP/PT pulse bilaterally. Has faint brachial pulse in left arm. Left hand is cool. Musculoskeletal: No joint deformities, No joint redness or warmth, no limitation of ROM in spin. Skin: No rashes.  Neuro: Alert, oriented X3, cranial nerves II-XII grossly intact, muscle strength 5/5 in all extremities, sensation to light touch decreased in left hand. Brachial reflex 1+ bilaterally.   Data Review   Micro Results No results found for this or any previous visit (from the past 240 hour(s)).  Radiology Reports No results found.   CBC  Recent Labs Lab 01/20/15 2251 01/21/15 0438 01/22/15 0029  WBC 8.6 8.4 7.2  HGB 14.9 15.1 14.4  HCT 43.7 43.7 41.7  PLT 323 325 286  MCV 89.7 90.1 90.8  MCH 30.6 31.1 31.4  MCHC 34.1 34.6 34.5  RDW 12.7 12.7 12.9  LYMPHSABS 2.1  --   --   MONOABS 0.5  --   --   EOSABS 0.0  --   --   BASOSABS 0.0  --   --     Chemistries   Recent Labs Lab 01/20/15 2251 01/21/15 0438 01/22/15 0029  NA 138 139 139  K 3.7 3.7 3.6  CL 104 105 106  CO2 25 27 27   GLUCOSE 104* 93 98  BUN 13 14 13   CREATININE 0.85 0.84 0.89  CALCIUM 9.7 9.3 9.1  AST  --  18 19  ALT  --  18 18  ALKPHOS  --  65 66  BILITOT  --  1.2 1.2   ------------------------------------------------------------------------------------------------------------------ estimated creatinine clearance is 85.4 mL/min (by C-G formula based on Cr of 0.89). ------------------------------------------------------------------------------------------------------------------ No results for input(s): HGBA1C in the last 72  hours. ------------------------------------------------------------------------------------------------------------------ No results for input(s): CHOL, HDL, LDLCALC, TRIG, CHOLHDL, LDLDIRECT in the last 72 hours. ------------------------------------------------------------------------------------------------------------------ No results for input(s): TSH, T4TOTAL, T3FREE, THYROIDAB in the last 72 hours.  Invalid input(s): FREET3 ------------------------------------------------------------------------------------------------------------------ No results for input(s): VITAMINB12, FOLATE, FERRITIN, TIBC, IRON, RETICCTPCT in the last 72 hours.  Coagulation profile  Recent Labs Lab 01/20/15 2251 01/21/15 0438 01/21/15 1226 01/21/15 1630 01/22/15 0029  INR 2.24* 2.36* 2.36* 1.53* 1.33    No results for input(s): DDIMER in the last 72 hours.  Cardiac Enzymes No results for input(s): CKMB, TROPONINI, MYOGLOBIN in the last 168 hours.  Invalid input(s): CK ------------------------------------------------------------------------------------------------------------------ Invalid input(s): POCBNP   CBG:  Recent Labs Lab 01/21/15 0817 01/22/15 0615  GLUCAP 95 82       Studies: No results found.    Lab Results  Component Value Date   HGBA1C 5.4 11/25/2014   HGBA1C  05/26/2007    5.5 (NOTE)  The ADA recommends the following therapeutic goals for glycemic   control related to Hgb A1C measurement:   Goal of Therapy:   < 7.0% Hgb A1C   Action Suggested:  > 8.0% Hgb A1C   Ref:  Diabetes Care, 22, Suppl. 1, 1999   Lab Results  Component Value Date   LDLCALC 82 11/25/2014   CREATININE 0.89 01/22/2015       Scheduled Meds: . sodium chloride   Intravenous Once  . atorvastatin  40 mg Oral Daily  . clonazePAM  1 mg Oral TID  . diltiazem  240 mg Oral Daily  . gabapentin  100 mg Oral QHS  . metoprolol  100 mg Oral BID  .  morphine injection  6 mg Intravenous Once  .  sodium chloride  3 mL Intravenous Q12H   Continuous Infusions: . sodium chloride 75 mL/hr at 01/22/15 1040  . heparin 1,200 Units/hr (01/22/15 0220)    Principal Problem:   Left arm pain Active Problems:   HYPERCHOLESTEROLEMIA-PURE   HYPERTENSION, BENIGN ESSENTIAL   Atrial fibrillation (HCC)   COPD (chronic obstructive pulmonary disease) ([place])   Long term (current) use of anticoagulants   Depression with anxiety   Ischemia of extremity   PAD (peripheral artery disease) (HCC)   Numbness    Time spent: 45 minutes   [facility] Pager [phone]. If 7PM-7AM, please contact night-coverage at [email or web address], password [email or web address] 01/22/2015, 10:42 AM  LOS: 2 days

## 2015-01-22 NOTE — Progress Notes (Signed)
ANTICOAGULATION CONSULT NOTE - Follow Up Consult  Pharmacy Consult for Heparin Indication: atrial fibrillation  Allergies  Allergen Reactions  . Aspirin Nausea And Vomiting    Patient Measurements: Weight: 169 lb 3.2 oz (76.749 kg)  Height: 70 inches IBW: 73 kg Heparin Dosing Weight: 76 kg  Vital Signs: Temp: 97.8 F (36.6 C) (12/14 0425) Temp Source: Oral (12/14 0425) BP: 159/113 mmHg (12/14 1041) Pulse Rate: 65 (12/14 0425)  Labs:  Recent Labs  01/20/15 2251 01/20/15 2355 01/21/15 0438 01/21/15 1226 01/21/15 1630 01/22/15 0029 01/22/15 0950 01/22/15 1035  HGB 14.9  --  15.1  --   --  14.4  --   --   HCT 43.7  --  43.7  --   --  41.7  --   --   PLT 323  --  325  --   --  286  --   --   APTT  --  47*  --   --   --   --   --   --   LABPROT 24.6*  --  25.6* 25.6* 18.5* 16.6*  --   --   INR 2.24*  --  2.36* 2.36* 1.53* 1.33  --   --   HEPARINUNFRC  --   --   --   --   --  <0.10* 0.24* 0.39  CREATININE 0.85  --  0.84  --   --  0.89  --   --     Estimated Creatinine Clearance: 85.4 mL/min (by C-G formula based on Cr of 0.89).   Medications:  Scheduled:  . sodium chloride   Intravenous Once  . atorvastatin  40 mg Oral Daily  . clonazePAM  1 mg Oral TID  . diltiazem  240 mg Oral Daily  . gabapentin  100 mg Oral QHS  . metoprolol  100 mg Oral BID  .  morphine injection  6 mg Intravenous Once  . sodium chloride  3 mL Intravenous Q12H   Infusions:  . sodium chloride 75 mL/hr at 01/22/15 1040  . heparin 1,200 Units/hr (01/22/15 0220)    Assessment: 64 yo M with hx afib.  Coumadin on hold for arteriogram today and bridging with heparin.  Heparin is therapeutic on 1200 units/hr.  Goal of Therapy:  Heparin level 0.3-0.7 units/ml Monitor platelets by anticoagulation protocol: Yes   Plan:  Continue heparin at 1200 units/hr Continue daily heparin level and CBC while on heparin Follow - up after procedure Follow - up restart of Coumadin  [employer],  Pharm.D., BCPS Clinical Pharmacist Pager [phone] 01/22/2015 12:49 PM

## 2015-01-22 NOTE — Interval H&P Note (Signed)
History and Physical Interval Note:  01/22/2015 12:56 PM  [name]  has presented today for surgery, with the diagnosis of claudication  The various methods of treatment have been discussed with the patient and family. After consideration of risks, benefits and other options for treatment, the patient has consented to  Procedure(s): Aortic Arch Angiography (N/A) as a surgical intervention .  The patient's history has been reviewed, patient examined, no change in status, stable for surgery.  I have reviewed the patient's chart and labs.  Questions were answered to the patient's satisfaction.     Ruta Hinds

## 2015-01-22 NOTE — Progress Notes (Addendum)
Site area: Right groin a 5 french arterial sheath was removed  Site Prior to Removal:  Level 0  Pressure Applied For 15 MINUTES    Minutes Beginning at 1415p  Manual:   Yes.    Patient Status During Pull:  stable  Post Pull Groin Site:  Level 0  Post Pull Instructions Given:  Yes.    Post Pull Pulses Present:  Yes.    Dressing Applied:  Yes.    Comments:VS remain stable during sheath pull.  BP elevated and med given per MD order.                        Dr Oneida Alar in to talk to pt about the results of the procedure and plan of care

## 2015-01-22 NOTE — Progress Notes (Signed)
ANTICOAGULATION CONSULT NOTE - Pharmacy Consult for Heparin Indication: atrial fibrillation  Allergies  Allergen Reactions  . Aspirin Nausea And Vomiting    Patient Measurements: Weight: 169 lb 3.2 oz (76.749 kg) Heparin Dosing Weight: 76.7 kg  Vital Signs: Temp: 97.9 F (36.6 C) (12/13 1938) Temp Source: Oral (12/13 1938) BP: 130/84 mmHg (12/13 1938) Pulse Rate: 70 (12/13 1938)  Labs:  Recent Labs  01/20/15 2251 01/20/15 2355 01/21/15 0438 01/21/15 1226 01/21/15 1630 01/22/15 0029  HGB 14.9  --  15.1  --   --  14.4  HCT 43.7  --  43.7  --   --  41.7  PLT 323  --  325  --   --  286  APTT  --  47*  --   --   --   --   LABPROT 24.6*  --  25.6* 25.6* 18.5* 16.6*  INR 2.24*  --  2.36* 2.36* 1.53* 1.33  HEPARINUNFRC  --   --   --   --   --  <0.10*  CREATININE 0.85  --  0.84  --   --  0.89    Estimated Creatinine Clearance: 85.4 mL/min (by C-G formula based on Cr of 0.89).  Assessment: 64 y.o. male with h/o Afib, Coumadin on hold for arteriogram today, for heparin  Goal of Therapy:  Heparin level 0.3-0.7 units/ml Monitor platelets by anticoagulation protocol: Yes   Plan:  Increase Heparin 1200 units/hr F/U after procedure  Coltan Spinello, Bronson Curb 01/22/2015,2:17 AM

## 2015-01-22 NOTE — Progress Notes (Signed)
PT Cancellation Note  Patient Details Name: [name] MRN: [id number] DOB: 1950-11-26   Cancelled Treatment:    Reason Eval/Treat Not Completed: Patient at procedure or test/unavailable Pt off floor for procedure. Will follow up next available time.   [name] 01/22/2015, 1:06 PM  Wray Kearns, [place], DPT [phone]

## 2015-01-22 NOTE — Progress Notes (Signed)
ANTICOAGULATION CONSULT NOTE - Follow Up Consult  Pharmacy Consult for Heparin Indication: atrial fibrillation  Allergies  Allergen Reactions  . Aspirin Nausea And Vomiting    Patient Measurements: Weight: 169 lb 3.2 oz (76.749 kg)  Height: 70 inches IBW: 73 kg Heparin Dosing Weight: 76 kg  Vital Signs: Temp: 97.8 F (36.6 C) (12/14 0425) Temp Source: Oral (12/14 0425) BP: 147/97 mmHg (12/14 1450) Pulse Rate: 73 (12/14 1450)  Labs:  Recent Labs  01/20/15 2251 01/20/15 2355 01/21/15 0438 01/21/15 1226 01/21/15 1630 01/22/15 0029 01/22/15 0950 01/22/15 1035  HGB 14.9  --  15.1  --   --  14.4  --   --   HCT 43.7  --  43.7  --   --  41.7  --   --   PLT 323  --  325  --   --  286  --   --   APTT  --  47*  --   --   --   --   --   --   LABPROT 24.6*  --  25.6* 25.6* 18.5* 16.6*  --   --   INR 2.24*  --  2.36* 2.36* 1.53* 1.33  --   --   HEPARINUNFRC  --   --   --   --   --  <0.10* 0.24* 0.39  CREATININE 0.85  --  0.84  --   --  0.89  --   --     Estimated Creatinine Clearance: 85.4 mL/min (by C-G formula based on Cr of 0.89).   Medications:  Scheduled:  . sodium chloride   Intravenous Once  . atorvastatin  40 mg Oral Daily  . clonazePAM  1 mg Oral TID  . diltiazem  240 mg Oral Daily  . gabapentin  100 mg Oral QHS  . metoprolol  100 mg Oral BID  .  morphine injection  6 mg Intravenous Once  . sodium chloride  3 mL Intravenous Q12H   Infusions:  . sodium chloride 100 mL/hr (01/22/15 1405)  . sodium chloride 75 mL/hr at 01/22/15 1040  . heparin      Assessment: 64 yo M with hx afib.     Heparin to resume at 7 pm tonight    Goal of Therapy:  Heparin level 0.3-0.7 units/ml Monitor platelets by anticoagulation protocol: Yes   Plan:  Heparin  at 1200 units/hr starting at 7 pm Continue daily heparin level and CBC while on heparin Follow - up restart of Coumadin  Thank you Anette Guarneri, PharmD [phone] 01/22/2015 2:58 PM

## 2015-01-22 NOTE — Op Note (Signed)
Procedure: Arch aortogram with left upper extremity arteriogram  Preoperative diagnosis: Ischemia left upper extremity  Postoperative diagnosis: Same  Anesthesia: Local  Operative findings: #1 normal arch anatomy #2 patent left axillary to brachial artery bypass with occlusion of native brachial artery and distal reconstitution with patent but very small ulnar artery and patent but small radial artery with the radial artery occluding at the level of the wrist  Operative details: After obtaining informed consent, the patient was taken to the fibula. The patient was placed in supine position on the Angio table. Both groins were prepped and draped in usual sterile fashion. Local anesthesia was ensured of the right common femoral artery. An introducer needle was used to cannulate the right common femoral artery under ultrasound guidance. An 035 versacore wire threaded up the abdominal aorta under fluoroscopic guidance. A 5 French sheath was placed over the guidewire the right common femoral artery. This was thoroughly flushed with heparinized saline. A 5 French pig catheter was advanced over the guidewire as a unit up in the aortic arch. Arch aortogram was obtained in a 60 LAO projection. This shows a patent innominate right subclavian and right common carotid origin the left common carotid origin is patent as well. The left subclavian artery is patent. The left vertebral artery is occluded. There is a bypass graft extending from the mid axillary artery to the proximal brachial artery. This is patent but with sluggish flow.   The catheter was exchanged over a guidewire for a Berenstein 2 catheter. This was used to slightly catheterize the left subclavian artery. Left upper extremity arteriogram is then obtained. This again shows the bypass from the axillary to the brachial artery is patent. Distal to the bypass the native left brachial artery is occluded. There is some reconstitution of the brachial artery  just at its bifurcation. The interosseous artery appears to be occluded. The radial artery is patent proximally but occludes at the level of the wrist. The ulnar artery is small but appears to be patent throughout its course.  At this point the [facility] 2 catheter was removed over a guidewire. The 5 French sheath was thoroughly flushed with heparinized saline. The patient tolerated procedure well and there were no complications. The patient was taken to the holding area in stable condition.  Operative management: Consideration will be given for bypass from the patient's upper left brachial artery to the brachial bifurcation or potentially to the radial or ulnar arteries.   Ruta Hinds, MD [facility] Office: [phone] Pager: [phone]

## 2015-01-23 ENCOUNTER — Encounter (HOSPITAL_COMMUNITY): Payer: Self-pay | Admitting: Vascular Surgery

## 2015-01-23 ENCOUNTER — Inpatient Hospital Stay (HOSPITAL_COMMUNITY)
Admission: EM | Admit: 2015-01-23 | Discharge: 2015-01-29 | DRG: 254 | Disposition: A | Discharge status: Home / Self Care | Payer: Medicare Other | Attending: Vascular Surgery | Admitting: Vascular Surgery

## 2015-01-23 DIAGNOSIS — I480 Paroxysmal atrial fibrillation: Secondary | ICD-10-CM

## 2015-01-23 DIAGNOSIS — Z01810 Encounter for preprocedural cardiovascular examination: Secondary | ICD-10-CM

## 2015-01-23 DIAGNOSIS — M62222 Nontraumatic ischemic infarction of muscle, left upper arm: Secondary | ICD-10-CM | POA: Diagnosis not present

## 2015-01-23 DIAGNOSIS — I998 Other disorder of circulatory system: Principal | ICD-10-CM | POA: Diagnosis present

## 2015-01-23 DIAGNOSIS — J449 Chronic obstructive pulmonary disease, unspecified: Secondary | ICD-10-CM | POA: Diagnosis present

## 2015-01-23 DIAGNOSIS — Z886 Allergy status to analgesic agent status: Secondary | ICD-10-CM

## 2015-01-23 DIAGNOSIS — E7800 Pure hypercholesterolemia, unspecified: Secondary | ICD-10-CM | POA: Diagnosis present

## 2015-01-23 DIAGNOSIS — J439 Emphysema, unspecified: Secondary | ICD-10-CM

## 2015-01-23 DIAGNOSIS — Z959 Presence of cardiac and vascular implant and graft, unspecified: Secondary | ICD-10-CM

## 2015-01-23 DIAGNOSIS — I10 Essential (primary) hypertension: Secondary | ICD-10-CM | POA: Diagnosis present

## 2015-01-23 DIAGNOSIS — Z87891 Personal history of nicotine dependence: Secondary | ICD-10-CM | POA: Diagnosis not present

## 2015-01-23 DIAGNOSIS — I4891 Unspecified atrial fibrillation: Secondary | ICD-10-CM | POA: Diagnosis present

## 2015-01-23 DIAGNOSIS — M79602 Pain in left arm: Secondary | ICD-10-CM | POA: Diagnosis not present

## 2015-01-23 DIAGNOSIS — M6289 Other specified disorders of muscle: Secondary | ICD-10-CM | POA: Diagnosis not present

## 2015-01-23 DIAGNOSIS — E876 Hypokalemia: Secondary | ICD-10-CM | POA: Diagnosis present

## 2015-01-23 DIAGNOSIS — Z8249 Family history of ischemic heart disease and other diseases of the circulatory system: Secondary | ICD-10-CM

## 2015-01-23 DIAGNOSIS — Z7901 Long term (current) use of anticoagulants: Secondary | ICD-10-CM | POA: Diagnosis not present

## 2015-01-23 DIAGNOSIS — F418 Other specified anxiety disorders: Secondary | ICD-10-CM | POA: Diagnosis present

## 2015-01-23 DIAGNOSIS — I739 Peripheral vascular disease, unspecified: Secondary | ICD-10-CM | POA: Diagnosis present

## 2015-01-23 DIAGNOSIS — Z8673 Personal history of transient ischemic attack (TIA), and cerebral infarction without residual deficits: Secondary | ICD-10-CM

## 2015-01-23 LAB — CBC WITH DIFFERENTIAL/PLATELET
BASOS PCT: 0 %
Basophils Absolute: 0 10*3/uL (ref 0.0–0.1)
Eosinophils Absolute: 0 10*3/uL (ref 0.0–0.7)
Eosinophils Relative: 0 %
HEMATOCRIT: 42.4 % (ref 39.0–52.0)
HEMOGLOBIN: 15.1 g/dL (ref 13.0–17.0)
LYMPHS ABS: 2.8 10*3/uL (ref 0.7–4.0)
LYMPHS PCT: 34 %
MCH: 31.7 pg (ref 26.0–34.0)
MCHC: 35.6 g/dL (ref 30.0–36.0)
MCV: 89.1 fL (ref 78.0–100.0)
MONO ABS: 0.6 10*3/uL (ref 0.1–1.0)
MONOS PCT: 7 %
NEUTROS ABS: 4.9 10*3/uL (ref 1.7–7.7)
NEUTROS PCT: 59 %
Platelets: 290 10*3/uL (ref 150–400)
RBC: 4.76 MIL/uL (ref 4.22–5.81)
RDW: 12.8 % (ref 11.5–15.5)
WBC: 8.3 10*3/uL (ref 4.0–10.5)

## 2015-01-23 LAB — PROTIME-INR
INR: 1.2 (ref 0.00–1.49)
PROTHROMBIN TIME: 15.4 s — AB (ref 11.6–15.2)

## 2015-01-23 LAB — BASIC METABOLIC PANEL
ANION GAP: 10 (ref 5–15)
BUN: 6 mg/dL (ref 6–20)
CALCIUM: 9.7 mg/dL (ref 8.9–10.3)
CHLORIDE: 105 mmol/L (ref 101–111)
CO2: 26 mmol/L (ref 22–32)
Creatinine, Ser: 0.88 mg/dL (ref 0.61–1.24)
GFR calc Af Amer: >60 mL/min (ref >60)
GFR calc non Af Amer: >60 mL/min (ref >60)
GLUCOSE: 107 mg/dL — AB (ref 65–99)
Potassium: 3.1 mmol/L — ABNORMAL LOW (ref 3.5–5.1)
Sodium: 141 mmol/L (ref 135–145)

## 2015-01-23 LAB — HEPARIN LEVEL (UNFRACTIONATED): Heparin Unfractionated: 0.12 IU/mL — ABNORMAL LOW (ref 0.30–0.70)

## 2015-01-23 MED ORDER — SODIUM CHLORIDE 0.9 % IV SOLN: INTRAVENOUS | Status: DC

## 2015-01-23 MED ORDER — ALBUTEROL SULFATE (2.5 MG/3ML) 0.083% IN NEBU: 2.5000 mg | INHALATION_SOLUTION | RESPIRATORY_TRACT | Status: DC | PRN

## 2015-01-23 MED ORDER — GUAIFENESIN-DM 100-10 MG/5ML PO SYRP: 5.0000 mL | ORAL_SOLUTION | ORAL | Status: DC | PRN

## 2015-01-23 MED ORDER — SODIUM CHLORIDE 0.9 % IJ SOLN: 3.0000 mL | INTRAMUSCULAR | Status: DC | PRN

## 2015-01-23 MED ORDER — OXYCODONE HCL 5 MG PO TABS
5.0000 mg | ORAL_TABLET | ORAL | Status: DC | PRN
  Administered 2015-01-24 – 2015-01-26 (×3): 5 mg via ORAL
  Filled 2015-01-23 (×3): qty 1

## 2015-01-23 MED ORDER — HEPARIN (PORCINE) IN NACL 100-0.45 UNIT/ML-% IJ SOLN
1400.0000 [IU]/h | INTRAMUSCULAR | Status: DC
  Administered 2015-01-23: 1200 [IU]/h via INTRAVENOUS
  Administered 2015-01-24: 1450 [IU]/h via INTRAVENOUS
  Administered 2015-01-25 – 2015-01-26 (×3): 1400 [IU]/h via INTRAVENOUS
  Filled 2015-01-23 (×4): qty 250

## 2015-01-23 MED ORDER — MORPHINE SULFATE (PF) 2 MG/ML IV SOLN: 1.0000 mg | INTRAVENOUS | Status: DC | PRN

## 2015-01-23 MED ORDER — ACETAMINOPHEN 500 MG PO TABS
1000.0000 mg | ORAL_TABLET | Freq: Two times a day (BID) | ORAL | Status: DC
  Administered 2015-01-23 – 2015-01-29 (×10): 1000 mg via ORAL
  Filled 2015-01-23 (×10): qty 2

## 2015-01-23 MED ORDER — ACETAMINOPHEN 325 MG PO TABS
650.0000 mg | ORAL_TABLET | Freq: Four times a day (QID) | ORAL | Status: DC | PRN
  Administered 2015-01-26: 650 mg via ORAL
  Filled 2015-01-23: qty 2

## 2015-01-23 MED ORDER — SODIUM CHLORIDE 0.9 % IV SOLN: 250.0000 mL | INTRAVENOUS | Status: DC | PRN

## 2015-01-23 MED ORDER — OXYCODONE-ACETAMINOPHEN 5-325 MG PO TABS
1.0000 | ORAL_TABLET | ORAL | Status: DC | PRN
  Administered 2015-01-27 – 2015-01-29 (×2): 2 via ORAL
  Filled 2015-01-23 (×2): qty 2

## 2015-01-23 MED ORDER — SODIUM CHLORIDE 0.9 % IJ SOLN
3.0000 mL | Freq: Two times a day (BID) | INTRAMUSCULAR | Status: DC
  Administered 2015-01-24 – 2015-01-27 (×7): 3 mL via INTRAVENOUS

## 2015-01-23 MED ORDER — DILTIAZEM HCL ER 240 MG PO CP24
240.0000 mg | ORAL_CAPSULE | Freq: Every day | ORAL | Status: DC
  Administered 2015-01-24 – 2015-01-29 (×5): 240 mg via ORAL
  Filled 2015-01-23 (×4): qty 1
  Filled 2015-01-23: qty 2
  Filled 2015-01-23 (×5): qty 1

## 2015-01-23 MED ORDER — GABAPENTIN 100 MG PO CAPS
100.0000 mg | ORAL_CAPSULE | Freq: Every day | ORAL | Status: DC
  Administered 2015-01-23 – 2015-01-28 (×6): 100 mg via ORAL
  Filled 2015-01-23 (×6): qty 1

## 2015-01-23 MED ORDER — METOPROLOL TARTRATE 100 MG PO TABS
100.0000 mg | ORAL_TABLET | Freq: Two times a day (BID) | ORAL | Status: DC
  Administered 2015-01-23 – 2015-01-29 (×12): 100 mg via ORAL
  Filled 2015-01-23 (×12): qty 1

## 2015-01-23 MED ORDER — HEPARIN BOLUS VIA INFUSION
4000.0000 [IU] | Freq: Once | INTRAVENOUS | Status: DC
  Filled 2015-01-23: qty 4000

## 2015-01-23 MED ORDER — SERTRALINE HCL 50 MG PO TABS
50.0000 mg | ORAL_TABLET | Freq: Every day | ORAL | Status: DC
  Administered 2015-01-23 – 2015-01-28 (×6): 50 mg via ORAL
  Filled 2015-01-23 (×6): qty 1

## 2015-01-23 MED ORDER — ONDANSETRON HCL 4 MG PO TABS: 4.0000 mg | ORAL_TABLET | Freq: Four times a day (QID) | ORAL | Status: DC | PRN

## 2015-01-23 MED ORDER — CLONAZEPAM 1 MG PO TABS
1.0000 mg | ORAL_TABLET | Freq: Three times a day (TID) | ORAL | Status: DC
  Administered 2015-01-23 – 2015-01-25 (×7): 1 mg via ORAL
  Filled 2015-01-23 (×7): qty 1

## 2015-01-23 MED ORDER — SENNA 8.6 MG PO TABS
1.0000 | ORAL_TABLET | Freq: Two times a day (BID) | ORAL | Status: DC
  Administered 2015-01-23 – 2015-01-29 (×11): 8.6 mg via ORAL
  Filled 2015-01-23 (×11): qty 1

## 2015-01-23 MED ORDER — POTASSIUM CHLORIDE CRYS ER 20 MEQ PO TBCR
40.0000 meq | EXTENDED_RELEASE_TABLET | Freq: Once | ORAL | Status: AC
  Administered 2015-01-23: 40 meq via ORAL
  Filled 2015-01-23: qty 2

## 2015-01-23 MED ORDER — ACETAMINOPHEN 650 MG RE SUPP
650.0000 mg | Freq: Four times a day (QID) | RECTAL | Status: DC | PRN
Start: 2015-01-23 — End: 2015-01-29

## 2015-01-23 MED ORDER — ATORVASTATIN CALCIUM 40 MG PO TABS
40.0000 mg | ORAL_TABLET | Freq: Every day | ORAL | Status: DC
  Administered 2015-01-24 – 2015-01-29 (×5): 40 mg via ORAL
  Filled 2015-01-23 (×5): qty 1

## 2015-01-23 MED ORDER — ONDANSETRON HCL 4 MG/2ML IJ SOLN: 4.0000 mg | Freq: Four times a day (QID) | INTRAMUSCULAR | Status: DC | PRN

## 2015-01-23 NOTE — Progress Notes (Addendum)
Patient trasfered from ED to [phone] via stretcher; alert and oriented x 4; no complaints of pain; IV in RAC running Heparin@12cc /hr. Orient patient to room and unit; gave patient care guide; instructed how to use the call bell and  fall risk precautions. Tele box 22 - placed. Will continue to monitor the patient.
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
## 2015-01-23 NOTE — Progress Notes (Signed)
Paged on-call MD about BP 114/83 and HR 81 and scheduled 100 mg metoprolol and 1 mg clonazepam.  MD returned page and said to administer scheduled metoprolol and clonazepam. Will carry out orders at this time.

## 2015-01-23 NOTE — Consult Note (Signed)
ANTICOAGULATION CONSULT NOTE - Initial Consult  Pharmacy Consult for Heparin Indication: limb ischemia  Allergies  Allergen Reactions  . Aspirin Nausea And Vomiting    Patient Measurements:   Weight: 169 lb 3.2 oz (76.749 kg)  Height: 70 inches IBW: 73 kg Heparin Dosing Weight: 76 kg  Vital Signs: Temp: 97.5 F (36.4 C) (12/15 1319) Temp Source: Oral (12/15 1319) BP: 152/111 mmHg (12/15 1630) Pulse Rate: 94 (12/15 1630)  Labs:  Recent Labs  01/20/15 2355 01/21/15 0438  01/21/15 1630 01/22/15 0029 01/22/15 0950 01/22/15 1035 01/23/15 1525 01/23/15 1605  HGB  --  15.1  --   --  14.4  --   --   --  15.1  HCT  --  43.7  --   --  41.7  --   --   --  42.4  PLT  --  325  --   --  286  --   --   --  290  APTT 47*  --   --   --   --   --   --   --   --   LABPROT  --  25.6*  < > 18.5* 16.6*  --   --  15.4*  --   INR  --  2.36*  < > 1.53* 1.33  --   --  1.20  --   HEPARINUNFRC  --   --   --   --  <0.10* 0.24* 0.39  --   --   CREATININE  --  0.84  --   --  0.89  --   --   --  0.88  < > = values in this interval not displayed.  Estimated Creatinine Clearance: 86.4 mL/min (by C-G formula based on Cr of 0.88).   Medical History: Past Medical History  Diagnosis Date  . Atrial fibrillation ([place])   . Hypertension   . Hyperlipidemia   . COPD (chronic obstructive pulmonary disease) ([place])   . Amaurosis fugax of left eye     history of   . Hx of echocardiogram     Echo (07/09/13):  Mild LVH, EF 55-60%, MAC, mild MR, severe LAE, mod RAE  . Renal insufficiency   . Stroke [facility])    Assessment: 64yom s/p left axillobrachial bypass graft Feb 2016 and s/p left axillobrachial bypass-saphenous vein following thrombectomy Oct 2016 was admitted 12/12 with left arm pain and numbness. 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 Plan was to start heparin last evening with plans for redo bypass on Monday, however, patient left AMA.  He returned to the ED today with left arm pain. He will begin heparin. He is on coumadin pta for afib, but INR is 1.2 (recieved FFP and vitamin k on 12/13).  Goal of Therapy:  Heparin level 0.3-0.7 units/ml Monitor platelets by anticoagulation protocol: Yes   Plan:  1) Heparin drip at 1200 units/hr (no bolus d/t recent arteriogram) 2) Check 6 hour heparin level 3) Daily heparin level and CBC 4) Follow up plans for redo bypass surgery  Deboraha Sprang 01/23/2015,4:46 PM
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
## 2015-01-23 NOTE — Consult Note (Signed)
ANTICOAGULATION CONSULT NOTE - Follow-up Consult  Pharmacy Consult for Heparin Indication: limb ischemia  Allergies  Allergen Reactions  . Aspirin Nausea And Vomiting    Patient Measurements: Height: 5\' 10"  (177.8 cm) Weight: 170 lb 6.4 oz (77.293 kg) IBW/kg (Calculated) : 73  Heparin Dosing Weight: 77 kg  Vital Signs: Temp: 98.1 F (36.7 C) (12/15 2141) Temp Source: Oral (12/15 2141) BP: 114/83 mmHg (12/15 2141) Pulse Rate: 81 (12/15 2141)  Labs:  Recent Labs  01/20/15 2355  01/21/15 0438  01/21/15 1630  01/22/15 0029 01/22/15 0950 01/22/15 1035 01/23/15 1525 01/23/15 1605 01/23/15 2330  HGB  --   < > 15.1  --   --   --  14.4  --   --   --  15.1  --   HCT  --   --  43.7  --   --   --  41.7  --   --   --  42.4  --   PLT  --   --  325  --   --   --  286  --   --   --  290  --   APTT 47*  --   --   --   --   --   --   --   --   --   --   --   LABPROT  --   --  25.6*  < > 18.5*  --  16.6*  --   --  15.4*  --   --   INR  --   --  2.36*  < > 1.53*  --  1.33  --   --  1.20  --   --   HEPARINUNFRC  --   --   --   --   --   < > <0.10* 0.24* 0.39  --   --  0.12*  CREATININE  --   --  0.84  --   --   --  0.89  --   --   --  0.88  --   < > = values in this interval not displayed.  Estimated Creatinine Clearance: 86.4 mL/min (by C-G formula based on Cr of 0.88).  Assessment: 64yom s/p left axillobrachial bypass graft Feb 2016 and s/p left axillobrachial bypass-saphenous vein following thrombectomy Oct 2016 was admitted 12/12 with left arm pain and numbness. 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 Plan was to start heparin last evening with plans for redo bypass on Monday, however, patient left AMA. He returned to the ED today with left arm pain and heparin started. He was on coumadin pta for afib, but INR is 1.2 (received FFP and vitamin k on 12/13).  Heparin level subtherapeutic (0.12) on 1200 units/hr. No issues  with line or bleeding reported per RN.  Goal of Therapy:  Heparin level 0.3-0.7 units/ml Monitor platelets by anticoagulation protocol: Yes   Plan:  1) Increase heparin drip to 1450 units/hr (no bolus d/t recent arteriogram) 2) Check 6 hour heparin level  Sherlon Handing, PharmD, BCPS Clinical pharmacist, pager [phone] 01/23/2015,11:52 PM

## 2015-01-23 NOTE — ED Notes (Signed)
Pt sts pain in left arm and told needed to have procedure to removed blood clot yesterday but pt sts had to leave; pt sts hx of same; CMS intact

## 2015-01-24 ENCOUNTER — Encounter (HOSPITAL_COMMUNITY): Payer: Self-pay

## 2015-01-24 ENCOUNTER — Inpatient Hospital Stay (HOSPITAL_COMMUNITY): Payer: Medicare Other

## 2015-01-24 DIAGNOSIS — Z01810 Encounter for preprocedural cardiovascular examination: Secondary | ICD-10-CM

## 2015-01-24 DIAGNOSIS — E7800 Pure hypercholesterolemia, unspecified: Secondary | ICD-10-CM

## 2015-01-24 LAB — HEPARIN LEVEL (UNFRACTIONATED)
HEPARIN UNFRACTIONATED: 0.53 [IU]/mL (ref 0.30–0.70)
HEPARIN UNFRACTIONATED: 0.28 [IU]/mL — AB (ref 0.30–0.70)
Heparin Unfractionated: 0.8 IU/mL — ABNORMAL HIGH (ref 0.30–0.70)

## 2015-01-24 LAB — CBC
HCT: 44.5 % (ref 39.0–52.0)
Hemoglobin: 15.1 g/dL (ref 13.0–17.0)
MCH: 30.9 pg (ref 26.0–34.0)
MCHC: 33.9 g/dL (ref 30.0–36.0)
MCV: 91 fL (ref 78.0–100.0)
PLATELETS: 274 10*3/uL (ref 150–400)
RBC: 4.89 MIL/uL (ref 4.22–5.81)
RDW: 13.1 % (ref 11.5–15.5)
WBC: 8.9 10*3/uL (ref 4.0–10.5)

## 2015-01-24 LAB — BASIC METABOLIC PANEL
Anion gap: 8 (ref 5–15)
BUN: 9 mg/dL (ref 6–20)
CALCIUM: 10.2 mg/dL (ref 8.9–10.3)
CO2: 30 mmol/L (ref 22–32)
CREATININE: 0.93 mg/dL (ref 0.61–1.24)
Chloride: 104 mmol/L (ref 101–111)
GFR calc Af Amer: >60 mL/min (ref >60)
GFR calc non Af Amer: >60 mL/min (ref >60)
GLUCOSE: 100 mg/dL — AB (ref 65–99)
Potassium: 3.7 mmol/L (ref 3.5–5.1)
Sodium: 142 mmol/L (ref 135–145)

## 2015-01-24 MED ORDER — PNEUMOCOCCAL VAC POLYVALENT 25 MCG/0.5ML IJ INJ
0.5000 mL | INJECTION | INTRAMUSCULAR | Status: AC
  Administered 2015-01-25: 0.5 mL via INTRAMUSCULAR
  Filled 2015-01-24 (×2): qty 0.5

## 2015-01-24 MED ORDER — GI COCKTAIL ~~LOC~~
30.0000 mL | Freq: Once | ORAL | Status: AC
  Administered 2015-01-24: 30 mL via ORAL
  Filled 2015-01-24: qty 30

## 2015-01-24 MED ORDER — DEXTROSE 5 % IV SOLN
1.5000 g | INTRAVENOUS | Status: AC
  Administered 2015-01-27: 1.5 g via INTRAVENOUS
  Filled 2015-01-24: qty 1.5

## 2015-01-24 MED ORDER — CEFUROXIME SODIUM 1.5 G IJ SOLR: 1.5000 g | INTRAMUSCULAR | Status: DC

## 2015-01-24 NOTE — Consult Note (Signed)
ANTICOAGULATION CONSULT NOTE - Follow-up Consult  Pharmacy Consult for Heparin Indication: limb ischemia  Patient Measurements: Height: 5\' 10"  (177.8 cm) Weight: 170 lb 6.4 oz (77.293 kg) IBW/kg (Calculated) : 73  Heparin Dosing Weight: 77 kg  Vital Signs: Temp: 97.4 F (36.3 C) (12/16 0611) Temp Source: Oral (12/16 0611) BP: 128/91 mmHg (12/16 0611) Pulse Rate: 68 (12/16 0611)  Labs:  Recent Labs  01/21/15 1630  01/22/15 0029  01/23/15 1525 01/23/15 1605 01/23/15 2330 01/24/15 0510 01/24/15 0550 01/24/15 1333  HGB  --   < > 14.4  --   --  15.1  --  15.1  --   --   HCT  --   --  41.7  --   --  42.4  --  44.5  --   --   PLT  --   --  286  --   --  290  --  274  --   --   LABPROT 18.5*  --  16.6*  --  15.4*  --   --   --   --   --   INR 1.53*  --  1.33  --  1.20  --   --   --   --   --   HEPARINUNFRC  --   --  <0.10*  < >  --   --  0.12*  --  0.53 0.80*  CREATININE  --   --  0.89  --   --  0.88  --  0.93  --   --   < > = values in this interval not displayed.  Assessment: 64yom s/p left axillobrachial bypass graft Feb 2016 and s/p left axillobrachial bypass-saphenous vein following thrombectomy Oct 2016 was admitted 12/12 with left arm pain and numbness. 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 Plan was to start heparin last evening with plans for redo bypass on Monday, however, patient left AMA. He returned to the ED today with left arm pain and heparin started. He was on coumadin pta for afib, but INR is 1.2 (received FFP and vitamin k on 12/13).  HL 0.80 on 1450 units/hr  Goal of Therapy:  Heparin level 0.3-0.7 units/ml Monitor platelets by anticoagulation protocol: Yes   Plan:  1) Decrease heparin drip to 1350 units/hr (no bolus d/t recent arteriogram) 2) Check 8 hour heparin level 3) Daily HL 4) Sx planned for Monday (12/19)  Vincenza Hews, PharmD, BCPS 01/24/2015, 2:28 PM Pager:  [phone]

## 2015-01-24 NOTE — Progress Notes (Signed)
VASCULAR LAB PRELIMINARY  PRELIMINARY  PRELIMINARY  PRELIMINARY  Right Lower Extremity Vein Map    Right Great Saphenous Vein   Segment Diameter Comment  1. Origin 5.74 mm   2. High Thigh 4.56 mm   3. Mid Thigh 3.65 mm   4. Low Thigh 3.79 mm Branch   5. At Knee 2.51 mm   6. High Calf 3.11 mm Multiple branches throughout the calf  7. Low Calf 2.88 mm   8. Ankle 2.33 mm    mm    mm    mm    Mansur Patti, RVT 01/24/2015, 3:43 PM

## 2015-01-24 NOTE — Care Management Note (Signed)
Case Management Note  Patient Details  Name: [name] MRN: [id number] Date of Birth: 10/12/1950  Subjective/Objective:   Patient lives at home alone. Patient is for a bypass on Monday, conts on heparin gtt. Indep. NCM will cont to follow for dc needs.                 Action/Plan:   Expected Discharge Date:                  Expected Discharge Plan:  Home/Self Care  In-House Referral:     Discharge planning Services  CM Consult  Post Acute Care Choice:    Choice offered to:     DME Arranged:    DME Agency:     HH Arranged:    HH Agency:     Status of Service:  In process, will continue to follow  Medicare Important Message Given:    Date Medicare IM Given:    Medicare IM give by:    Date Additional Medicare IM Given:    Additional Medicare Important Message give by:     If discussed at [employer] of Stay Meetings, dates discussed:    Additional Comments:  Zenon Mayo, RN 01/24/2015, 6:31 PM

## 2015-01-24 NOTE — Consult Note (Signed)
[facility]  Subjective  - less numbness in hand   Objective 128/91 68 97.4 F (36.3 C) (Oral) 18 98%  Intake/Output Summary (Last 24 hours) at 01/24/15 0853 Last data filed at 01/24/15 [id number]  Gross per 24 hour  Intake      0 ml  Output      0 ml  Net      0 ml   Left upper extremity hand warm, upper arm brachial/axillary pulse  Assessment/Planning: Left brachial artery bypass using right greater saphenous on Monday Continue heparin until 8 am Monday Will write preop orders Needs right leg vein map  Ruta Hinds 01/24/2015 8:53 AM --  Laboratory Lab Results:  Recent Labs  01/23/15 1605 01/24/15 0510  WBC 8.3 8.9  HGB 15.1 15.1  HCT 42.4 44.5  PLT 290 274   BMET  Recent Labs  01/23/15 1605 01/24/15 0510  NA 141 142  K 3.1* 3.7  CL 105 104  CO2 26 30  GLUCOSE 107* 100*  BUN 6 9  CREATININE 0.88 0.93  CALCIUM 9.7 10.2    COAG Lab Results  Component Value Date   INR 1.20 01/23/2015   INR 1.33 01/22/2015   INR 1.53* 01/21/2015   No results found for: PTT

## 2015-01-24 NOTE — Progress Notes (Signed)
Patient was told multiple times that he could not get into the shower because of risk to safety, heart monitor, and his IV.Pt agreed that getting a wash up at the sink would suffice. Upon entering room during bedside shift report, patient noted to be in the shower. States, "I know I wasn't supposed to but I just had to take a shower."

## 2015-01-24 NOTE — Consult Note (Signed)
ANTICOAGULATION CONSULT NOTE - Follow-up Consult  Pharmacy Consult for Heparin Indication: limb ischemia  Patient Measurements: Height: 5\' 10"  (177.8 cm) Weight: 170 lb 6.4 oz (77.293 kg) IBW/kg (Calculated) : 73  Heparin Dosing Weight: 77 kg  Vital Signs: Temp: 98.5 F (36.9 C) (12/16 2118) Temp Source: Oral (12/16 2118) BP: 156/95 mmHg (12/16 2118) Pulse Rate: 87 (12/16 2118)  Labs:  Recent Labs  01/22/15 0029  01/23/15 1525 01/23/15 1605  01/24/15 0510 01/24/15 0550 01/24/15 1333 01/24/15 2200  HGB 14.4  --   --  15.1  --  15.1  --   --   --   HCT 41.7  --   --  42.4  --  44.5  --   --   --   PLT 286  --   --  290  --  274  --   --   --   LABPROT 16.6*  --  15.4*  --   --   --   --   --   --   INR 1.33  --  1.20  --   --   --   --   --   --   HEPARINUNFRC <0.10*  < >  --   --   < >  --  0.53 0.80* 0.28*  CREATININE 0.89  --   --  0.88  --  0.93  --   --   --   < > = values in this interval not displayed.  Assessment: 64yom s/p left axillobrachial bypass graft Feb 2016 and s/p left axillobrachial bypass-saphenous vein following thrombectomy Oct 2016 was admitted 12/12 with left arm pain and numbness. 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 Plan was to start heparin last evening with plans for redo bypass on Monday, however, patient left AMA. He returned to the ED today with left arm pain and heparin started. He was on coumadin pta for afib, but INR is 1.2 (received FFP and vitamin k on 12/13).  Heparin level 0.8>>0.28 this PM.  Goal of Therapy:  Heparin level 0.3-0.7 units/ml Monitor platelets by anticoagulation protocol: Yes   Plan:  Change heparin to 1400 units/hr Daily HL and CBC  Sheila Ocasio S. Alford Highland, PharmD, [place] Clinical Staff Pharmacist Pager [phone]

## 2015-01-24 NOTE — Progress Notes (Signed)
PATIENT DETAILS Name: [name] Age: 64 y.o. Sex: male Date of Birth: 1950-06-12 Admit Date: 01/23/2015 Admitting Physician Evalee Mutton Kristeen Mans, MD [id number] M, MD  Subjective: Left arm numbness unchanged  Assessment/Plan: Left upper arm ischemia:underewent Angiogram on 12/14-following which recommendations were to place on IV Heparin gtt and tentatively undergo a bypass on 12/19-however patient signed out [facility] on 12/14.Returned on 12/15 to get readmitted. Seen by [facility]-bypass scheduled for 12/19, continue IV heparin. LUE is warm and remains unchanged since admission.  Dyslipidemia:continue Statin  COPD (chronic obstructive pulmonary disease):lungs clear-continue bronchodilators.  Atrial fibrillation:rate control with metoprolol and cardizem. On coumadin-but INR reversed recently for angiogram-will be on IV Heparin gtt-will restart coumadin or NOAC's once done with all surgical procedures.CHA2DS2-VASc Score is 5  [id number] Metoprolol and Cardizem  Depression with anxiety: Appears stable. Continue Klonopin and Zoloft.  Hypokalemia: Replete and recheck.   Disposition: Remain inpatient  Antimicrobial agents  See below  Anti-infectives    Start     Dose/Rate Route Frequency Ordered Stop   01/27/15 0600  cefUROXime (ZINACEF) 1.5 g in dextrose 5 % 50 mL IVPB     1.5 g 100 mL/hr over 30 Minutes Intravenous To ShortStay Procedural 01/24/15 0859 01/28/15 0600   01/27/15 0600  cefUROXime (ZINACEF) 1.5 g in dextrose 5 % 50 mL IVPB  Status:  Discontinued     1.5 g 100 mL/hr over 30 Minutes Intravenous On call to O.R. 01/24/15 0859 01/24/15 0913      DVT Prophylaxis: IV Heparin   Code Status: Full code   Family Communication None at bedside  Procedures: None  CONSULTS:  vascular surgery  Time spent 30 minutes-Greater than 50% of this time was spent in counseling, explanation of diagnosis, planning of further management, and coordination of  care.  MEDICATIONS: Scheduled Meds: . acetaminophen  1,000 mg Oral BID  . atorvastatin  40 mg Oral Daily  . [START ON 01/27/2015] cefUROXime (ZINACEF)  IV  1.5 g Intravenous to SS-Proc  . clonazePAM  1 mg Oral TID  . diltiazem  240 mg Oral Daily  . gabapentin  100 mg Oral QHS  . metoprolol  100 mg Oral BID  . [START ON 01/25/2015] pneumococcal 23 valent vaccine  0.5 mL Intramuscular Tomorrow-1000  . senna  1 tablet Oral BID  . sertraline  50 mg Oral QHS  . sodium chloride  3 mL Intravenous Q12H   Continuous Infusions: . heparin 1,450 Units/hr (01/24/15 1350)   PRN Meds:.sodium chloride, acetaminophen **OR** acetaminophen, albuterol, guaiFENesin-dextromethorphan, morphine injection, ondansetron **OR** ondansetron (ZOFRAN) IV, oxyCODONE, oxyCODONE-acetaminophen, sodium chloride    PHYSICAL EXAM: Vital signs in last 24 hours: Filed Vitals:   01/23/15 1901 01/23/15 1903 01/23/15 2141 01/24/15 0611  BP:  152/96 114/83 128/91  Pulse:  79 81 68  Temp:  97.9 F (36.6 C) 98.1 F (36.7 C) 97.4 F (36.3 C)  TempSrc:  Oral Oral Oral  Resp:  16 20 18   Height: 5\' 10"  (1.778 m)     Weight: 77.293 kg (170 lb 6.4 oz)     SpO2:  100% 99% 98%    Weight change:  Filed Weights   01/23/15 1901  Weight: 77.293 kg (170 lb 6.4 oz)   Body mass index is 24.45 kg/(m^2).   Gen Exam: Awake and alert with clear speech.   Neck: Supple, No JVD.   Chest: B/L Clear.   CVS: S1 S2 Regular,  no murmurs.  Abdomen: soft, BS +, non tender, non distended.  Extremities: no edema, lower extremities warm to touch. Left upper extremity appears warm and essentially unchanged from 12/15 Neurologic: Non Focal.   Skin: No Rash.   Wounds: N/A.   Intake/Output from previous day:  Intake/Output Summary (Last 24 hours) at 01/24/15 1422 Last data filed at 01/24/15 0903  Gross per 24 hour  Intake    220 ml  Output      0 ml  Net    220 ml     LAB RESULTS: CBC  Recent Labs Lab 01/20/15 2251  01/21/15 0438 01/22/15 0029 01/23/15 1605 01/24/15 0510  WBC 8.6 8.4 7.2 8.3 8.9  HGB 14.9 15.1 14.4 15.1 15.1  HCT 43.7 43.7 41.7 42.4 44.5  PLT 323 325 286 290 274  MCV 89.7 90.1 90.8 89.1 91.0  MCH 30.6 31.1 31.4 31.7 30.9  MCHC 34.1 34.6 34.5 35.6 33.9  RDW 12.7 12.7 12.9 12.8 13.1  LYMPHSABS 2.1  --   --  2.8  --   MONOABS 0.5  --   --  0.6  --   EOSABS 0.0  --   --  0.0  --   BASOSABS 0.0  --   --  0.0  --     Chemistries   Recent Labs Lab 01/20/15 2251 01/21/15 0438 01/22/15 0029 01/23/15 1605 01/24/15 0510  NA 138 139 139 141 142  K 3.7 3.7 3.6 3.1* 3.7  CL 104 105 106 105 104  CO2 25 27 27 26 30   GLUCOSE 104* 93 98 107* 100*  BUN 13 14 13 6 9   CREATININE 0.85 0.84 0.89 0.88 0.93  CALCIUM 9.7 9.3 9.1 9.7 10.2    CBG:  Recent Labs Lab 01/21/15 0817 01/22/15 0615  GLUCAP 95 82    GFR Estimated Creatinine Clearance: 81.8 mL/min (by C-G formula based on Cr of 0.93).  Coagulation profile  Recent Labs Lab 01/21/15 0438 01/21/15 1226 01/21/15 1630 01/22/15 0029 01/23/15 1525  INR 2.36* 2.36* 1.53* 1.33 1.20    Cardiac Enzymes No results for input(s): CKMB, TROPONINI, MYOGLOBIN in the last 168 hours.  Invalid input(s): CK  Invalid input(s): POCBNP No results for input(s): DDIMER in the last 72 hours. No results for input(s): HGBA1C in the last 72 hours. No results for input(s): CHOL, HDL, LDLCALC, TRIG, CHOLHDL, LDLDIRECT in the last 72 hours. No results for input(s): TSH, T4TOTAL, T3FREE, THYROIDAB in the last 72 hours.  Invalid input(s): FREET3 No results for input(s): VITAMINB12, FOLATE, FERRITIN, TIBC, IRON, RETICCTPCT in the last 72 hours. No results for input(s): LIPASE, AMYLASE in the last 72 hours.  Urine Studies No results for input(s): UHGB, CRYS in the last 72 hours.  Invalid input(s): UACOL, UAPR, USPG, UPH, UTP, UGL, UKET, UBIL, UNIT, UROB, ULEU, UEPI, UWBC, URBC, UBAC, CAST, UCOM, BILUA  MICROBIOLOGY: No results found  for this or any previous visit (from the past 240 hour(s)).  RADIOLOGY STUDIES/RESULTS: No results found.  Oren Binet, MD  [facility] Pager:[phone]  If 7PM-7AM, please contact night-coverage [email or web address] Password [email or web address] 01/24/2015, 2:22 PM   LOS: 1 day

## 2015-01-24 NOTE — Progress Notes (Signed)
ANTICOAGULATION CONSULT NOTE - Follow Up Consult  Pharmacy Consult for heparin Indication: limb ischemia   Labs:  Recent Labs  01/21/15 1630  01/22/15 0029  01/22/15 1035 01/23/15 1525 01/23/15 1605 01/23/15 2330 01/24/15 0510 01/24/15 0550  HGB  --   < > 14.4  --   --   --  15.1  --  15.1  --   HCT  --   --  41.7  --   --   --  42.4  --  44.5  --   PLT  --   --  286  --   --   --  290  --  274  --   LABPROT 18.5*  --  16.6*  --   --  15.4*  --   --   --   --   INR 1.53*  --  1.33  --   --  1.20  --   --   --   --   HEPARINUNFRC  --   --  <0.10*  < > 0.39  --   --  0.12*  --  0.53  CREATININE  --   --  0.89  --   --   --  0.88  --  0.93  --   < > = values in this interval not displayed.    Assessment/Plan:  64yo male therapeutic on heparin after rate increase. Will continue gtt at current rate and confirm stable with additional level.   Wynona Neat, PharmD, BCPS  01/24/2015,7:10 AM

## 2015-01-24 NOTE — Progress Notes (Signed)
Utilization review completed. Tarisha Fader, RN, BSN. 

## 2015-01-25 DIAGNOSIS — I998 Other disorder of circulatory system: Secondary | ICD-10-CM | POA: Diagnosis not present

## 2015-01-25 LAB — CBC
HEMATOCRIT: 41 % (ref 39.0–52.0)
HEMOGLOBIN: 13.7 g/dL (ref 13.0–17.0)
MCH: 30.6 pg (ref 26.0–34.0)
MCHC: 33.4 g/dL (ref 30.0–36.0)
MCV: 91.7 fL (ref 78.0–100.0)
PLATELETS: 242 10*3/uL (ref 150–400)
RBC: 4.47 MIL/uL (ref 4.22–5.81)
RDW: 13.1 % (ref 11.5–15.5)
WBC: 7.9 10*3/uL (ref 4.0–10.5)

## 2015-01-25 LAB — HEPARIN LEVEL (UNFRACTIONATED)
Heparin Unfractionated: 0.33 IU/mL (ref 0.30–0.70)
Heparin Unfractionated: 0.48 IU/mL (ref 0.30–0.70)

## 2015-01-25 NOTE — Progress Notes (Signed)
Patient has clothing wallet with approximately $20 with some credit cards and bank.  Offered to lock the wallet up patient refused.  Patient placed the wallet in the night stand

## 2015-01-25 NOTE — Consult Note (Addendum)
ANTICOAGULATION CONSULT NOTE - Follow-up Consult  Pharmacy Consult for Heparin Indication: limb ischemia  Patient Measurements: Height: 5\' 10"  (177.8 cm) Weight: 170 lb 6.4 oz (77.293 kg) IBW/kg (Calculated) : 73  Heparin Dosing Weight: 77 kg  Vital Signs: Temp: 98.6 F (37 C) (12/17 0502) Temp Source: Oral (12/17 0502) BP: 121/80 mmHg (12/17 0502) Pulse Rate: 72 (12/17 0502)  Labs:  Recent Labs  01/23/15 1525  01/23/15 1605  01/24/15 0510  01/24/15 1333 01/24/15 2200 01/25/15 0515  HGB  --   < > 15.1  --  15.1  --   --   --  13.7  HCT  --   --  42.4  --  44.5  --   --   --  41.0  PLT  --   --  290  --  274  --   --   --  242  LABPROT 15.4*  --   --   --   --   --   --   --   --   INR 1.20  --   --   --   --   --   --   --   --   HEPARINUNFRC  --   --   --   < >  --   < > 0.80* 0.28* 0.33  CREATININE  --   --  0.88  --  0.93  --   --   --   --   < > = values in this interval not displayed.  Assessment: 64yom s/p left axillobrachial bypass graft Feb 2016 and s/p left axillobrachial bypass-saphenous vein following thrombectomy Oct 2016 was admitted 12/12 with left arm pain and numbness. 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 Pt left AMA then returned to ED 12/15 with left arm pain and heparin started. He was on coumadin pta for afib, but INR was 1.2 (received FFP and vitamin k on 12/13).  HL 0.33 (therapeutic), Hgb/plt wnl  Goal of Therapy:  Heparin level 0.3-0.7 units/ml Monitor platelets by anticoagulation protocol: Yes   Plan:  Continue heparin at 1400 units/hr Obtain  6hr confirmatory heparin level  Daily HL and CBC  Darl Pikes, PharmD Clinical Pharmacist- Resident Pager: [phone]

## 2015-01-25 NOTE — Consult Note (Signed)
ANTICOAGULATION CONSULT NOTE - Follow-up Consult  Pharmacy Consult for Heparin Indication: limb ischemia  Patient Measurements: Height: 5\' 10"  (177.8 cm) Weight: 170 lb 6.4 oz (77.293 kg) IBW/kg (Calculated) : 73  Heparin Dosing Weight: 77 kg  Vital Signs: Temp: 98.6 F (37 C) (12/17 0502) Temp Source: Oral (12/17 0502) BP: 121/80 mmHg (12/17 0502) Pulse Rate: 72 (12/17 0502)  Labs:  Recent Labs  01/23/15 1525  01/23/15 1605  01/24/15 0510  01/24/15 2200 01/25/15 0515 01/25/15 1025  HGB  --   < > 15.1  --  15.1  --   --  13.7  --   HCT  --   --  42.4  --  44.5  --   --  41.0  --   PLT  --   --  290  --  274  --   --  242  --   LABPROT 15.4*  --   --   --   --   --   --   --   --   INR 1.20  --   --   --   --   --   --   --   --   HEPARINUNFRC  --   --   --   < >  --   < > 0.28* 0.33 0.48  CREATININE  --   --  0.88  --  0.93  --   --   --   --   < > = values in this interval not displayed.  Assessment: 64yom s/p left axillobrachial bypass graft Feb 2016 and s/p left axillobrachial bypass-saphenous vein following thrombectomy Oct 2016 was admitted 12/12 with left arm pain and numbness. 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 Pt left AMA then returned to ED 12/15 with left arm pain and heparin started. He was on coumadin pta for afib, but INR was 1.2 (received FFP and vitamin k on 12/13).  HL 0.48 (therapeutic x2), Hgb/plt wnl  Goal of Therapy:  Heparin level 0.3-0.7 units/ml Monitor platelets by anticoagulation protocol: Yes   Plan:  Continue heparin at 1400 units/hr  Daily HL and CBC Watch for s/sx of bleeding  Darl Pikes, PharmD Clinical Pharmacist- Resident Pager: [phone]

## 2015-01-25 NOTE — Progress Notes (Signed)
PATIENT DETAILS Name: [name] Age: 64 y.o. Sex: male Date of Birth: 01-Jul-1950 Admit Date: 01/23/2015 Admitting Physician Evalee Mutton Kristeen Mans, MD [id number] M, MD  Subjective: Left arm unchanged-no other complaints  Assessment/Plan: Left upper arm ischemia:underewent Angiogram on 12/14-following which recommendations were to place on IV Heparin gtt and tentatively undergo a bypass on 12/19-however patient signed out [place] on 12/14.Returned on 12/15 to get readmitted. Seen by [facility]-bypass scheduled for 12/19, continue IV heparin. LUE is warm and remains unchanged since admission.  Dyslipidemia:continue Statin  COPD (chronic obstructive pulmonary disease):lungs clear-continue bronchodilators.  Atrial fibrillation:rate control with metoprolol and cardizem. On coumadin-but INR reversed recently for angiogram-will be on IV Heparin gtt-will restart coumadin or NOAC's once done with all surgical procedures.CHA2DS2-VASc Score is 5  [id number] Metoprolol and Cardizem  Depression with anxiety: Appears stable. Continue Klonopin and Zoloft.  Hypokalemia: Replete and recheck.   Disposition: Remain inpatient-home when all surgical procedures are complete  Antimicrobial agents  See below  Anti-infectives    Start     Dose/Rate Route Frequency Ordered Stop   01/27/15 0600  cefUROXime (ZINACEF) 1.5 g in dextrose 5 % 50 mL IVPB     1.5 g 100 mL/hr over 30 Minutes Intravenous To ShortStay Procedural 01/24/15 0859 01/28/15 0600   01/27/15 0600  cefUROXime (ZINACEF) 1.5 g in dextrose 5 % 50 mL IVPB  Status:  Discontinued     1.5 g 100 mL/hr over 30 Minutes Intravenous On call to O.R. 01/24/15 0859 01/24/15 0913      DVT Prophylaxis: IV Heparin   Code Status: Full code   Family Communication None at bedside  Procedures: None  CONSULTS:  vascular surgery  Time spent 20 minutes-Greater than 50% of this time was spent in counseling, explanation of  diagnosis, planning of further management, and coordination of care.  MEDICATIONS: Scheduled Meds: . acetaminophen  1,000 mg Oral BID  . atorvastatin  40 mg Oral Daily  . [START ON 01/27/2015] cefUROXime (ZINACEF)  IV  1.5 g Intravenous to SS-Proc  . clonazePAM  1 mg Oral TID  . diltiazem  240 mg Oral Daily  . gabapentin  100 mg Oral QHS  . metoprolol  100 mg Oral BID  . senna  1 tablet Oral BID  . sertraline  50 mg Oral QHS  . sodium chloride  3 mL Intravenous Q12H   Continuous Infusions: . heparin 1,400 Units/hr (01/25/15 0715)   PRN Meds:.sodium chloride, acetaminophen **OR** acetaminophen, albuterol, guaiFENesin-dextromethorphan, morphine injection, ondansetron **OR** ondansetron (ZOFRAN) IV, oxyCODONE, oxyCODONE-acetaminophen, sodium chloride    PHYSICAL EXAM: Vital signs in last 24 hours: Filed Vitals:   01/24/15 0611 01/24/15 1309 01/24/15 2118 01/25/15 0502  BP: 128/91 126/82 156/95 121/80  Pulse: 68 76 87 72  Temp: 97.4 F (36.3 C) 98 F (36.7 C) 98.5 F (36.9 C) 98.6 F (37 C)  TempSrc: Oral Oral Oral Oral  Resp: 18 18 19 19   Height:      Weight:      SpO2: 98% 99% 98% 99%    Weight change:  Filed Weights   01/23/15 1901  Weight: 77.293 kg (170 lb 6.4 oz)   Body mass index is 24.45 kg/(m^2).   Gen Exam: Awake and alert with clear speech.   Neck: Supple, No JVD.   Chest: B/L Clear.   CVS: S1 S2 Regular, no murmurs.  Abdomen: soft, BS +, non tender, non distended.  Extremities: no edema, lower extremities warm to touch. Left upper extremity appears warm and essentially unchanged from 12/15 Neurologic: Non Focal.   Skin: No Rash.   Wounds: N/A.   Intake/Output from previous day:  Intake/Output Summary (Last 24 hours) at 01/25/15 1351 Last data filed at 01/25/15 0715  Gross per 24 hour  Intake  274.2 ml  Output      0 ml  Net  274.2 ml     LAB RESULTS: CBC  Recent Labs Lab 01/20/15 2251 01/21/15 0438 01/22/15 0029 01/23/15 1605  01/24/15 0510 01/25/15 0515  WBC 8.6 8.4 7.2 8.3 8.9 7.9  HGB 14.9 15.1 14.4 15.1 15.1 13.7  HCT 43.7 43.7 41.7 42.4 44.5 41.0  PLT 323 325 286 290 274 242  MCV 89.7 90.1 90.8 89.1 91.0 91.7  MCH 30.6 31.1 31.4 31.7 30.9 30.6  MCHC 34.1 34.6 34.5 35.6 33.9 33.4  RDW 12.7 12.7 12.9 12.8 13.1 13.1  LYMPHSABS 2.1  --   --  2.8  --   --   MONOABS 0.5  --   --  0.6  --   --   EOSABS 0.0  --   --  0.0  --   --   BASOSABS 0.0  --   --  0.0  --   --     Chemistries   Recent Labs Lab 01/20/15 2251 01/21/15 0438 01/22/15 0029 01/23/15 1605 01/24/15 0510  NA 138 139 139 141 142  K 3.7 3.7 3.6 3.1* 3.7  CL 104 105 106 105 104  CO2 25 27 27 26 30   GLUCOSE 104* 93 98 107* 100*  BUN 13 14 13 6 9   CREATININE 0.85 0.84 0.89 0.88 0.93  CALCIUM 9.7 9.3 9.1 9.7 10.2    CBG:  Recent Labs Lab 01/21/15 0817 01/22/15 0615  GLUCAP 95 82    GFR Estimated Creatinine Clearance: 81.8 mL/min (by C-G formula based on Cr of 0.93).  Coagulation profile  Recent Labs Lab 01/21/15 0438 01/21/15 1226 01/21/15 1630 01/22/15 0029 01/23/15 1525  INR 2.36* 2.36* 1.53* 1.33 1.20    Cardiac Enzymes No results for input(s): CKMB, TROPONINI, MYOGLOBIN in the last 168 hours.  Invalid input(s): CK  Invalid input(s): POCBNP No results for input(s): DDIMER in the last 72 hours. No results for input(s): HGBA1C in the last 72 hours. No results for input(s): CHOL, HDL, LDLCALC, TRIG, CHOLHDL, LDLDIRECT in the last 72 hours. No results for input(s): TSH, T4TOTAL, T3FREE, THYROIDAB in the last 72 hours.  Invalid input(s): FREET3 No results for input(s): VITAMINB12, FOLATE, FERRITIN, TIBC, IRON, RETICCTPCT in the last 72 hours. No results for input(s): LIPASE, AMYLASE in the last 72 hours.  Urine Studies No results for input(s): UHGB, CRYS in the last 72 hours.  Invalid input(s): UACOL, UAPR, USPG, UPH, UTP, UGL, UKET, UBIL, UNIT, UROB, ULEU, UEPI, UWBC, URBC, UBAC, CAST, UCOM,  BILUA  MICROBIOLOGY: No results found for this or any previous visit (from the past 240 hour(s)).  RADIOLOGY STUDIES/RESULTS: No results found.  Oren Binet, MD  [facility] Pager:[phone]  If 7PM-7AM, please contact night-coverage [email or web address] Password [email or web address] 01/25/2015, 1:51 PM   LOS: 2 days

## 2015-01-26 LAB — CBC
HCT: 42 % (ref 39.0–52.0)
HEMOGLOBIN: 14.2 g/dL (ref 13.0–17.0)
MCH: 31.1 pg (ref 26.0–34.0)
MCHC: 33.8 g/dL (ref 30.0–36.0)
MCV: 92.1 fL (ref 78.0–100.0)
PLATELETS: 240 10*3/uL (ref 150–400)
RBC: 4.56 MIL/uL (ref 4.22–5.81)
RDW: 13.2 % (ref 11.5–15.5)
WBC: 10.5 10*3/uL (ref 4.0–10.5)

## 2015-01-26 LAB — HEPARIN LEVEL (UNFRACTIONATED): Heparin Unfractionated: 0.6 IU/mL (ref 0.30–0.70)

## 2015-01-26 MED ORDER — CLONAZEPAM 1 MG PO TABS
1.0000 mg | ORAL_TABLET | Freq: Three times a day (TID) | ORAL | Status: DC
  Administered 2015-01-26 – 2015-01-29 (×8): 1 mg via ORAL
  Filled 2015-01-26 (×8): qty 1

## 2015-01-26 MED ORDER — CLONAZEPAM 1 MG PO TABS: 1.0000 mg | ORAL_TABLET | Freq: Three times a day (TID) | ORAL | Status: DC | PRN

## 2015-01-26 MED ORDER — CLONAZEPAM 1 MG PO TABS: 2.0000 mg | ORAL_TABLET | Freq: Three times a day (TID) | ORAL | Status: DC

## 2015-01-26 NOTE — Progress Notes (Signed)
PATIENT DETAILS Name: [name] Age: 64 y.o. Sex: male Date of Birth: 25-Dec-1950 Admit Date: 01/23/2015 Admitting Physician Evalee Mutton Kristeen Mans, MD [id number] M, MD  Subjective: Left arm unchanged-anxious about recent house break in (stole $30K)  Assessment/Plan: Left upper arm ischemia:underewent Angiogram on 12/14-following which recommendations were to place on IV Heparin gtt and tentatively undergo a bypass on 12/19-however patient signed out [place] on 12/14.Returned on 12/15 to get readmitted. Seen by [facility]-bypass scheduled for 12/19, continue IV heparin. LUE is warm and remains unchanged since admission.  Dyslipidemia:continue Statin  COPD (chronic obstructive pulmonary disease):lungs clear-continue bronchodilators.  Atrial fibrillation:rate control with metoprolol and cardizem. On coumadin-but INR reversed recently for angiogram-will be on IV Heparin gtt-will restart coumadin or NOAC's once done with all surgical procedures.CHA2DS2-VASc Score is 5  [id number] Metoprolol and Cardizem  Depression with anxiety:More anxious today. Continue Klonopin and Zoloft.Add prn Klonopin as well.  Hypokalemia: Repleted  Disposition: Remain inpatient-home when all surgical procedures are complete  Antimicrobial agents  See below  Anti-infectives    Start     Dose/Rate Route Frequency Ordered Stop   01/27/15 0600  cefUROXime (ZINACEF) 1.5 g in dextrose 5 % 50 mL IVPB     1.5 g 100 mL/hr over 30 Minutes Intravenous To ShortStay Procedural 01/24/15 0859 01/28/15 0600   01/27/15 0600  cefUROXime (ZINACEF) 1.5 g in dextrose 5 % 50 mL IVPB  Status:  Discontinued     1.5 g 100 mL/hr over 30 Minutes Intravenous On call to O.R. 01/24/15 0859 01/24/15 0913      DVT Prophylaxis: IV Heparin   Code Status: Full code   Family Communication None at bedside  Procedures: None  CONSULTS:  vascular surgery  Time spent 20 minutes-Greater than 50% of this  time was spent in counseling, explanation of diagnosis, planning of further management, and coordination of care.  MEDICATIONS: Scheduled Meds: . acetaminophen  1,000 mg Oral BID  . atorvastatin  40 mg Oral Daily  . [START ON 01/27/2015] cefUROXime (ZINACEF)  IV  1.5 g Intravenous to SS-Proc  . clonazePAM  1 mg Oral TID  . diltiazem  240 mg Oral Daily  . gabapentin  100 mg Oral QHS  . metoprolol  100 mg Oral BID  . senna  1 tablet Oral BID  . sertraline  50 mg Oral QHS  . sodium chloride  3 mL Intravenous Q12H   Continuous Infusions: . heparin 1,400 Units/hr (01/26/15 0845)   PRN Meds:.sodium chloride, acetaminophen **OR** acetaminophen, albuterol, clonazePAM, guaiFENesin-dextromethorphan, morphine injection, ondansetron **OR** ondansetron (ZOFRAN) IV, oxyCODONE, oxyCODONE-acetaminophen, sodium chloride    PHYSICAL EXAM: Vital signs in last 24 hours: Filed Vitals:   01/25/15 0502 01/25/15 2229 01/26/15 0628 01/26/15 1322  BP: 121/80 130/91 131/92 141/99  Pulse: 72 80 89 69  Temp: 98.6 F (37 C) 98.2 F (36.8 C) 97.8 F (36.6 C) 97.7 F (36.5 C)  TempSrc: Oral Oral Oral Oral  Resp: 19 17 18 20   Height:      Weight:      SpO2: 99% 98% 99% 100%    Weight change:  Filed Weights   01/23/15 1901  Weight: 77.293 kg (170 lb 6.4 oz)   Body mass index is 24.45 kg/(m^2).   Gen Exam: Awake and alert with clear speech.  Mildly anxious Neck: Supple, No JVD.   Chest: B/L Clear.  No rhonchi CVS: S1 S2 Regular, no murmurs.  Abdomen: soft, BS +, non tender, non distended.  Extremities: no edema, lower extremities warm to touch. Left upper extremity appears warm and essentially unchanged from 12/15 Neurologic: Non Focal.   Skin: No Rash.   Wounds: N/A.   Intake/Output from previous day:  Intake/Output Summary (Last 24 hours) at 01/26/15 1356 Last data filed at 01/26/15 0955  Gross per 24 hour  Intake    577 ml  Output      0 ml  Net    577 ml     LAB  RESULTS: CBC  Recent Labs Lab 01/20/15 2251  01/22/15 0029 01/23/15 1605 01/24/15 0510 01/25/15 0515 01/26/15 0525  WBC 8.6  < > 7.2 8.3 8.9 7.9 10.5  HGB 14.9  < > 14.4 15.1 15.1 13.7 14.2  HCT 43.7  < > 41.7 42.4 44.5 41.0 42.0  PLT 323  < > 286 290 274 242 240  MCV 89.7  < > 90.8 89.1 91.0 91.7 92.1  MCH 30.6  < > 31.4 31.7 30.9 30.6 31.1  MCHC 34.1  < > 34.5 35.6 33.9 33.4 33.8  RDW 12.7  < > 12.9 12.8 13.1 13.1 13.2  LYMPHSABS 2.1  --   --  2.8  --   --   --   MONOABS 0.5  --   --  0.6  --   --   --   EOSABS 0.0  --   --  0.0  --   --   --   BASOSABS 0.0  --   --  0.0  --   --   --   < > = values in this interval not displayed.  Chemistries   Recent Labs Lab 01/20/15 2251 01/21/15 0438 01/22/15 0029 01/23/15 1605 01/24/15 0510  NA 138 139 139 141 142  K 3.7 3.7 3.6 3.1* 3.7  CL 104 105 106 105 104  CO2 25 27 27 26 30   GLUCOSE 104* 93 98 107* 100*  BUN 13 14 13 6 9   CREATININE 0.85 0.84 0.89 0.88 0.93  CALCIUM 9.7 9.3 9.1 9.7 10.2    CBG:  Recent Labs Lab 01/21/15 0817 01/22/15 0615  GLUCAP 95 82    GFR Estimated Creatinine Clearance: 81.8 mL/min (by C-G formula based on Cr of 0.93).  Coagulation profile  Recent Labs Lab 01/21/15 0438 01/21/15 1226 01/21/15 1630 01/22/15 0029 01/23/15 1525  INR 2.36* 2.36* 1.53* 1.33 1.20    Cardiac Enzymes No results for input(s): CKMB, TROPONINI, MYOGLOBIN in the last 168 hours.  Invalid input(s): CK  Invalid input(s): POCBNP No results for input(s): DDIMER in the last 72 hours. No results for input(s): HGBA1C in the last 72 hours. No results for input(s): CHOL, HDL, LDLCALC, TRIG, CHOLHDL, LDLDIRECT in the last 72 hours. No results for input(s): TSH, T4TOTAL, T3FREE, THYROIDAB in the last 72 hours.  Invalid input(s): FREET3 No results for input(s): VITAMINB12, FOLATE, FERRITIN, TIBC, IRON, RETICCTPCT in the last 72 hours. No results for input(s): LIPASE, AMYLASE in the last 72 hours.  Urine  Studies No results for input(s): UHGB, CRYS in the last 72 hours.  Invalid input(s): UACOL, UAPR, USPG, UPH, UTP, UGL, UKET, UBIL, UNIT, UROB, ULEU, UEPI, UWBC, URBC, UBAC, CAST, UCOM, BILUA  MICROBIOLOGY: No results found for this or any previous visit (from the past 240 hour(s)).  RADIOLOGY STUDIES/RESULTS: No results found.  Oren Binet, MD  [facility] Pager:[phone]  If 7PM-7AM, please contact night-coverage [email or web address] Password [email or web address] 01/26/2015, 1:56 PM  LOS: 3 days

## 2015-01-26 NOTE — Progress Notes (Signed)
VASCULAR SURGERY  Patient is scheduled for a left brachial artery bypass tomorrow. All his questions were answered. He would very much like to go home Tuesday if possible.  Deitra Mayo, MD, [place] [phone] Office: [phone]

## 2015-01-26 NOTE — Consult Note (Signed)
ANTICOAGULATION CONSULT NOTE - Follow-up Consult  Pharmacy Consult for Heparin Indication: limb ischemia  Patient Measurements: Height: 5\' 10"  (177.8 cm) Weight: 170 lb 6.4 oz (77.293 kg) IBW/kg (Calculated) : 73  Heparin Dosing Weight: 77 kg  Vital Signs: Temp: 97.8 F (36.6 C) (12/18 0628) Temp Source: Oral (12/18 0628) BP: 131/92 mmHg (12/18 0628) Pulse Rate: 89 (12/18 0628)  Labs:  Recent Labs  01/23/15 1525  01/23/15 1605  01/24/15 0510  01/25/15 0515 01/25/15 1025 01/26/15 0525  HGB  --   < > 15.1  --  15.1  --  13.7  --  14.2  HCT  --   < > 42.4  --  44.5  --  41.0  --  42.0  PLT  --   < > 290  --  274  --  242  --  240  LABPROT 15.4*  --   --   --   --   --   --   --   --   INR 1.20  --   --   --   --   --   --   --   --   HEPARINUNFRC  --   --   --   < >  --   < > 0.33 0.48 0.60  CREATININE  --   --  0.88  --  0.93  --   --   --   --   < > = values in this interval not displayed.  Assessment: 64yom s/p left axillobrachial bypass graft Feb 2016 and s/p left axillobrachial bypass-saphenous vein following thrombectomy Oct 2016 was admitted 12/12 with left arm pain and numbness. 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 Pt left AMA then returned to ED 12/15 with left arm pain and heparin started. He was on coumadin pta for afib, but INR was 1.2 (received FFP and vitamin k on 12/13).  HL 0.6 (therapeutic), Hgb/plt wnl  Goal of Therapy:  Heparin level 0.3-0.7 units/ml Monitor platelets by anticoagulation protocol: Yes   Plan:  Continue heparin at 1400 units/hr  Daily HL and CBC Watch for s/sx of bleeding  Darl Pikes, PharmD Clinical Pharmacist- Resident Pager: [phone]

## 2015-01-27 ENCOUNTER — Inpatient Hospital Stay (HOSPITAL_COMMUNITY): Payer: Medicare Other | Admitting: Certified Registered Nurse Anesthetist

## 2015-01-27 ENCOUNTER — Encounter (HOSPITAL_COMMUNITY)
Admission: EM | Disposition: A | Discharge status: Home / Self Care | Payer: Self-pay | Source: Home / Self Care | Attending: Internal Medicine

## 2015-01-27 DIAGNOSIS — I998 Other disorder of circulatory system: Secondary | ICD-10-CM

## 2015-01-27 HISTORY — PX: VEIN HARVEST: SHX6363

## 2015-01-27 HISTORY — PX: BYPASS AXILLA/BRACHIAL ARTERY: SHX6426

## 2015-01-27 LAB — BASIC METABOLIC PANEL
Anion gap: 9 (ref 5–15)
BUN: 9 mg/dL (ref 6–20)
CALCIUM: 9.7 mg/dL (ref 8.9–10.3)
CO2: 29 mmol/L (ref 22–32)
CREATININE: 0.94 mg/dL (ref 0.61–1.24)
Chloride: 102 mmol/L (ref 101–111)
Glucose, Bld: 89 mg/dL (ref 65–99)
Potassium: 3.6 mmol/L (ref 3.5–5.1)
Sodium: 140 mmol/L (ref 135–145)

## 2015-01-27 LAB — SURGICAL PCR SCREEN
MRSA, PCR: NEGATIVE
Staphylococcus aureus: NEGATIVE

## 2015-01-27 LAB — PROTIME-INR
INR: 1.18 (ref 0.00–1.49)
Prothrombin Time: 15.2 seconds (ref 11.6–15.2)

## 2015-01-27 LAB — CBC
HEMATOCRIT: 43.4 % (ref 39.0–52.0)
Hemoglobin: 14.4 g/dL (ref 13.0–17.0)
MCH: 30.7 pg (ref 26.0–34.0)
MCHC: 33.2 g/dL (ref 30.0–36.0)
MCV: 92.5 fL (ref 78.0–100.0)
PLATELETS: 232 10*3/uL (ref 150–400)
RBC: 4.69 MIL/uL (ref 4.22–5.81)
RDW: 13.3 % (ref 11.5–15.5)
WBC: 7.3 10*3/uL (ref 4.0–10.5)

## 2015-01-27 LAB — HEPARIN LEVEL (UNFRACTIONATED): HEPARIN UNFRACTIONATED: 0.92 [IU]/mL — AB (ref 0.30–0.70)

## 2015-01-27 SURGERY — CREATION, BYPASS, ARTERIAL, AXILLARY TO BRACHIAL
Anesthesia: General | Site: Leg Upper | Laterality: Right

## 2015-01-27 MED ORDER — MIDAZOLAM HCL 5 MG/5ML IJ SOLN
INTRAMUSCULAR | Status: DC | PRN
  Administered 2015-01-27: 2 mg via INTRAVENOUS

## 2015-01-27 MED ORDER — HYDRALAZINE HCL 20 MG/ML IJ SOLN: 5.0000 mg | INTRAMUSCULAR | Status: DC | PRN

## 2015-01-27 MED ORDER — PROPOFOL 10 MG/ML IV BOLUS
INTRAVENOUS | Status: DC | PRN
  Administered 2015-01-27: 20 mg via INTRAVENOUS
  Administered 2015-01-27: 200 mg via INTRAVENOUS

## 2015-01-27 MED ORDER — FENTANYL CITRATE (PF) 250 MCG/5ML IJ SOLN
INTRAMUSCULAR | Status: AC
  Filled 2015-01-27: qty 5

## 2015-01-27 MED ORDER — PHENOL 1.4 % MT LIQD: 1.0000 | OROMUCOSAL | Status: DC | PRN

## 2015-01-27 MED ORDER — PANTOPRAZOLE SODIUM 40 MG PO TBEC
40.0000 mg | DELAYED_RELEASE_TABLET | Freq: Every day | ORAL | Status: DC
  Administered 2015-01-27 – 2015-01-29 (×3): 40 mg via ORAL
  Filled 2015-01-27 (×3): qty 1

## 2015-01-27 MED ORDER — ONDANSETRON HCL 4 MG/2ML IJ SOLN
INTRAMUSCULAR | Status: DC | PRN
  Administered 2015-01-27: 4 mg via INTRAVENOUS

## 2015-01-27 MED ORDER — FENTANYL CITRATE (PF) 100 MCG/2ML IJ SOLN
INTRAMUSCULAR | Status: DC | PRN
Start: 2015-01-27 — End: 2015-01-27
  Administered 2015-01-27 (×8): 50 ug via INTRAVENOUS

## 2015-01-27 MED ORDER — SODIUM CHLORIDE 0.9 % IV SOLN
500.0000 mL | Freq: Once | INTRAVENOUS | Status: AC | PRN
  Administered 2015-01-28: 500 mL via INTRAVENOUS

## 2015-01-27 MED ORDER — MAGNESIUM SULFATE 2 GM/50ML IV SOLN
2.0000 g | Freq: Every day | INTRAVENOUS | Status: DC | PRN
  Filled 2015-01-27: qty 50

## 2015-01-27 MED ORDER — SODIUM CHLORIDE 0.9 % IV SOLN
INTRAVENOUS | Status: DC | PRN
  Administered 2015-01-27: 500 mL

## 2015-01-27 MED ORDER — SUGAMMADEX SODIUM 200 MG/2ML IV SOLN
INTRAVENOUS | Status: AC
  Filled 2015-01-27: qty 2

## 2015-01-27 MED ORDER — LACTATED RINGERS IV SOLN
INTRAVENOUS | Status: DC | PRN
  Administered 2015-01-27 (×2): via INTRAVENOUS

## 2015-01-27 MED ORDER — PHENYLEPHRINE HCL 10 MG/ML IJ SOLN
10.0000 mg | INTRAVENOUS | Status: DC | PRN
  Administered 2015-01-27: 15 ug/min via INTRAVENOUS

## 2015-01-27 MED ORDER — DEXTROSE 5 % IV SOLN
1.5000 g | Freq: Two times a day (BID) | INTRAVENOUS | Status: AC
  Administered 2015-01-28 (×2): 1.5 g via INTRAVENOUS
  Filled 2015-01-27 (×2): qty 1.5

## 2015-01-27 MED ORDER — SODIUM CHLORIDE 0.45 % IV SOLN
INTRAVENOUS | Status: DC
  Administered 2015-01-27: 22:00:00 via INTRAVENOUS

## 2015-01-27 MED ORDER — 0.9 % SODIUM CHLORIDE (POUR BTL) OPTIME
TOPICAL | Status: DC | PRN
  Administered 2015-01-27: 1000 mL

## 2015-01-27 MED ORDER — ALUM & MAG HYDROXIDE-SIMETH 200-200-20 MG/5ML PO SUSP: 15.0000 mL | ORAL | Status: DC | PRN

## 2015-01-27 MED ORDER — ROCURONIUM BROMIDE 100 MG/10ML IV SOLN
INTRAVENOUS | Status: DC | PRN
  Administered 2015-01-27: 10 mg via INTRAVENOUS
  Administered 2015-01-27: 50 mg via INTRAVENOUS
  Administered 2015-01-27 (×5): 10 mg via INTRAVENOUS

## 2015-01-27 MED ORDER — PHENYLEPHRINE HCL 10 MG/ML IJ SOLN
INTRAMUSCULAR | Status: DC | PRN
  Administered 2015-01-27: 40 ug via INTRAVENOUS

## 2015-01-27 MED ORDER — LABETALOL HCL 5 MG/ML IV SOLN: 10.0000 mg | INTRAVENOUS | Status: DC | PRN

## 2015-01-27 MED ORDER — SUGAMMADEX SODIUM 200 MG/2ML IV SOLN
INTRAVENOUS | Status: DC | PRN
  Administered 2015-01-27: 154.6 mg via INTRAVENOUS

## 2015-01-27 MED ORDER — MIDAZOLAM HCL 2 MG/2ML IJ SOLN
INTRAMUSCULAR | Status: AC
  Filled 2015-01-27: qty 2

## 2015-01-27 MED ORDER — HEPARIN SODIUM (PORCINE) 1000 UNIT/ML IJ SOLN
INTRAMUSCULAR | Status: DC | PRN
  Administered 2015-01-27: 7000 [IU] via INTRAVENOUS

## 2015-01-27 MED ORDER — HYDROMORPHONE HCL 1 MG/ML IJ SOLN
0.2500 mg | INTRAMUSCULAR | Status: DC | PRN
  Administered 2015-01-27 (×2): 0.5 mg via INTRAVENOUS

## 2015-01-27 MED ORDER — LACTATED RINGERS IV SOLN: INTRAVENOUS | Status: DC | PRN

## 2015-01-27 MED ORDER — POTASSIUM CHLORIDE CRYS ER 20 MEQ PO TBCR: 20.0000 meq | EXTENDED_RELEASE_TABLET | Freq: Every day | ORAL | Status: DC | PRN

## 2015-01-27 MED ORDER — ALBUMIN HUMAN 5 % IV SOLN
INTRAVENOUS | Status: DC | PRN
  Administered 2015-01-27: 15:00:00 via INTRAVENOUS

## 2015-01-27 MED ORDER — PROTAMINE SULFATE 10 MG/ML IV SOLN
INTRAVENOUS | Status: DC | PRN
  Administered 2015-01-27: 70 mg via INTRAVENOUS

## 2015-01-27 MED ORDER — LACTATED RINGERS IV SOLN
INTRAVENOUS | Status: DC
  Administered 2015-01-27: 13:00:00 via INTRAVENOUS

## 2015-01-27 MED ORDER — LIDOCAINE HCL (CARDIAC) 20 MG/ML IV SOLN
INTRAVENOUS | Status: DC | PRN
  Administered 2015-01-27: 60 mg via INTRAVENOUS

## 2015-01-27 MED ORDER — HYDROMORPHONE HCL 1 MG/ML IJ SOLN
INTRAMUSCULAR | Status: AC
  Filled 2015-01-27: qty 1

## 2015-01-27 MED ORDER — DOCUSATE SODIUM 100 MG PO CAPS
100.0000 mg | ORAL_CAPSULE | Freq: Every day | ORAL | Status: DC
  Administered 2015-01-28 – 2015-01-29 (×2): 100 mg via ORAL
  Filled 2015-01-27 (×2): qty 1

## 2015-01-27 MED ORDER — METOPROLOL TARTRATE 1 MG/ML IV SOLN: 2.0000 mg | INTRAVENOUS | Status: DC | PRN

## 2015-01-27 SURGICAL SUPPLY — 52 items
CANISTER SUCTION 2500CC (MISCELLANEOUS) ×3 IMPLANT
CANNULA VESSEL 3MM 2 BLNT TIP (CANNULA) ×3 IMPLANT
CLIP TI MEDIUM 24 (CLIP) ×3 IMPLANT
CLIP TI WIDE RED SMALL 24 (CLIP) ×3 IMPLANT
DRAIN SNY 10X20 3/4 PERF (WOUND CARE) IMPLANT
DRAPE INCISE IOBAN 66X45 STRL (DRAPES) IMPLANT
DRAPE ORTHO SPLIT 77X108 STRL (DRAPES) ×2
DRAPE SURG ORHT 6 SPLT 77X108 (DRAPES) ×4 IMPLANT
DRSG COVADERM 4X10 (GAUZE/BANDAGES/DRESSINGS) ×3 IMPLANT
DRSG COVADERM 4X14 (GAUZE/BANDAGES/DRESSINGS) ×6 IMPLANT
DRSG COVADERM 4X8 (GAUZE/BANDAGES/DRESSINGS) IMPLANT
ELECT BLADE 4.0 EZ CLEAN MEGAD (MISCELLANEOUS) ×3
ELECT REM PT RETURN 9FT ADLT (ELECTROSURGICAL) ×6
ELECTRODE BLDE 4.0 EZ CLN MEGD (MISCELLANEOUS) ×2 IMPLANT
ELECTRODE REM PT RTRN 9FT ADLT (ELECTROSURGICAL) ×4 IMPLANT
EVACUATOR SILICONE 100CC (DRAIN) IMPLANT
GAUZE SPONGE 4X4 16PLY XRAY LF (GAUZE/BANDAGES/DRESSINGS) ×3 IMPLANT
GLOVE BIO SURGEON STRL SZ 6.5 (GLOVE) ×9 IMPLANT
GLOVE BIO SURGEON STRL SZ7.5 (GLOVE) ×3 IMPLANT
GLOVE BIOGEL PI IND STRL 6.5 (GLOVE) ×6 IMPLANT
GLOVE BIOGEL PI INDICATOR 6.5 (GLOVE) ×3
GLOVE ECLIPSE 6.5 STRL STRAW (GLOVE) ×6 IMPLANT
GLOVE SS BIOGEL STRL SZ 6.5 (GLOVE) ×2 IMPLANT
GLOVE SUPERSENSE BIOGEL SZ 6.5 (GLOVE) ×1
GOWN STRL REUS W/ TWL LRG LVL3 (GOWN DISPOSABLE) ×8 IMPLANT
GOWN STRL REUS W/TWL LRG LVL3 (GOWN DISPOSABLE) ×4
KIT BASIN OR (CUSTOM PROCEDURE TRAY) ×3 IMPLANT
KIT ROOM TURNOVER OR (KITS) ×3 IMPLANT
LIQUID BAND (GAUZE/BANDAGES/DRESSINGS) ×6 IMPLANT
LOOP VESSEL MINI RED (MISCELLANEOUS) ×6 IMPLANT
MARKER SKIN DUAL TIP RULER LAB (MISCELLANEOUS) ×3 IMPLANT
NS IRRIG 1000ML POUR BTL (IV SOLUTION) ×6 IMPLANT
PACK PERIPHERAL VASCULAR (CUSTOM PROCEDURE TRAY) ×3 IMPLANT
PAD ARMBOARD 7.5X6 YLW CONV (MISCELLANEOUS) ×6 IMPLANT
PENCIL BUTTON HOLSTER BLD 10FT (ELECTRODE) ×3 IMPLANT
SPONGE SURGIFOAM ABS GEL 100 (HEMOSTASIS) IMPLANT
STAPLER VISISTAT 35W (STAPLE) ×9 IMPLANT
STOCKINETTE 6  STRL (DRAPES) ×1
STOCKINETTE 6 STRL (DRAPES) ×2 IMPLANT
SUT PROLENE 5 0 C 1 24 (SUTURE) ×6 IMPLANT
SUT PROLENE 6 0 CC (SUTURE) ×9 IMPLANT
SUT PROLENE 7 0 BV 1 (SUTURE) ×6 IMPLANT
SUT SILK 2 0 FS (SUTURE) ×3 IMPLANT
SUT SILK 3 0 (SUTURE) ×2
SUT SILK 3-0 18XBRD TIE 12 (SUTURE) ×4 IMPLANT
SUT VIC AB 3-0 SH 27 (SUTURE) ×4
SUT VIC AB 3-0 SH 27X BRD (SUTURE) ×8 IMPLANT
SUT VICRYL 4-0 PS2 18IN ABS (SUTURE) ×9 IMPLANT
TAPE UMBILICAL COTTON 1/8X30 (MISCELLANEOUS) ×3 IMPLANT
TOWEL OR 17X24 6PK STRL BLUE (TOWEL DISPOSABLE) ×6 IMPLANT
TRAY FOLEY W/METER SILVER 16FR (SET/KITS/TRAYS/PACK) ×3 IMPLANT
WATER STERILE IRR 1000ML POUR (IV SOLUTION) ×3 IMPLANT

## 2015-01-27 NOTE — Interval H&P Note (Signed)
History and Physical Interval Note:  01/27/2015 12:17 PM  [name]  has presented today for surgery, with the diagnosis of Ischemic left upper extremity M62.89  The various methods of treatment have been discussed with the patient and family. After consideration of risks, benefits and other options for treatment, the patient has consented to  Procedure(s): BYPASS BRACHIAL ARTERY (Left) [place] (Right) as a surgical intervention .  The patient's history has been reviewed, patient examined, no change in status, stable for surgery.  I have reviewed the patient's chart and labs.  Questions were answered to the patient's satisfaction.     Ruta Hinds

## 2015-01-27 NOTE — Op Note (Signed)
Procedure: Left brachial to ulnar artery bypass using reversed right greater saphenous vein  Preoperative diagnosis: Chronic ischemia left hand  Postoperative diagnosis: Same  Anesthesia: Gen.  Assistant: Leontine Locket PA-C, Silva Bandy PA-C  Operative findings: #1 good quality saphenous vein 3.5-4 mm #2 small ulnar artery barely accepting a 2 mm dilator  Operative details: After pain informed consent, the patient was taken to the operating room. The patient was placed in supine position operating table. After induction general anesthesia and endotracheal intubation, the patient's entire left upper extremity and right lower extremities were prepped and draped in usual sterile fashion. A jejunal incision was made on the ulnar aspect of the left arm several centimeters below the elbow incision was carried down through the subcutaneous saphenous tissues down to level of fascia. The muscle plane was opened up between the flexor carpi ulnaris and flexor superficialis muscles. This was deepened and the ulnar nerve was identified.  I tried to minimize traction on this nerve and deepened the incision down to the level of the ulnar artery just below this. This was fairly small but did not seem to be severely calcified. It was dissected free circumferentially over several centimeters and vessel loops placed around it. Attention was then turned to the mid upper arm. A longitudinal incision was made in this location. On physical tissues down to level of the brachial artery. This was dissected free circumferentially and vessel loops placed proximal and distal to the planned arteriotomy site. Several side branches were controlled with vessel loops.  Attention was then turned to the right leg. The right greater saphenous vein was harvested through several skip incisions extending from the groin down to the knee. The vein was of good quality approximately 3.5-4 mm in diameter. The vein was ligated proximally and  distally with 2-0 silk tie. It was transected between these ties. The vein was then gently flushed with heparinized saline and distended to make sure that it was hemostatic. The patient was given 8000 units of intravenous heparin. The brachial artery was controlled proximally and distally with vessel loops. A longitudinal opening was made in the brachial artery and the vein placed in reversed configuration and sewn end of vein to side of artery using a running 6-0 Prolene suture. At completion anastomosis this was thoroughly flushed the vein graft. There was good pulsatile flow through this. Hemostasis was obtained at the proximal anastomosis. The graft was then tunneled subcutaneously down to the level of the ulnar artery. The ulnar artery was controlled proximal and distally with vessel loops. Again I try to minimize traction on the ulnar nerve. The artery was opened longitudinally the vein graft was cut to length and sewn end of graft to side of artery using a running 7-0 Prolene suture. The ulnar artery was quite small barely accepting a 2 mm dilator. Just prior completion anastomosis was forebled backbled and thoroughly flushed. Anastomosis was secured Vesseloops released there was good pulsatile flow in the graft immediately. There was triphasic flow in the graft as well as brisk monophasic and close to biphasic flow in the ulnar artery at the level of the wrist. Patient was given 80 mg of protamine for heparin reversal. All incisions were then made hemostatic. The subcutaneous tissues of all incisions in the leg and the arm closed with running 3-0 Vicryl suture. The leg incisions were then closed staples in the skin. The 2 arm incisions were closed with a 4 Vicryl subcuticular stitch in the skin. Liquid band adhesive was  applied to both of the arm incisions. The patient tolerated procedure well and there were no complications. Instrument sponge and needle counts were correct at the end of the case.  Ruta Hinds, MD [facility] Office: [phone] Pager: [phone]
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
## 2015-01-27 NOTE — Transfer of Care (Signed)
Immediate Anesthesia Transfer of Care Note  Patient: [name]  Procedure(s) Performed: Procedure(s): LEFT BRACHIAL-ULAR ARTERY BYPASS USING GREATER SAPHENOUS VEIN (Left) RIGHT GREATER SAPHENOUS VEIN HARVEST (Right)  Patient Location: PACU  Anesthesia Type:General  Level of Consciousness: awake, alert , patient cooperative and responds to stimulation  Airway & Oxygen Therapy: Patient Spontanous Breathing and Patient connected to nasal cannula oxygen  Post-op Assessment: Report given to RN, Post -op Vital signs reviewed and stable and Patient moving all extremities X 4  Post vital signs: Reviewed and stable  Last Vitals:  Filed Vitals:   01/27/15 0548 01/27/15 1707  BP: 112/72 147/105  Pulse: 69   Temp: 36.4 C 36.9 C  Resp: 16 14    Complications: No apparent anesthesia complications

## 2015-01-27 NOTE — Anesthesia Postprocedure Evaluation (Signed)
Anesthesia Post Note  Patient: [name]  Procedure(s) Performed: Procedure(s) (LRB): LEFT BRACHIAL-ULAR ARTERY BYPASS USING GREATER SAPHENOUS VEIN (Left) RIGHT GREATER SAPHENOUS VEIN HARVEST (Right)  Patient location during evaluation: PACU Anesthesia Type: General Level of consciousness: awake and alert, awake and oriented Pain management: pain level controlled Vital Signs Assessment: post-procedure vital signs reviewed and stable Respiratory status: spontaneous breathing, nonlabored ventilation, respiratory function stable and patient connected to nasal cannula oxygen Cardiovascular status: blood pressure returned to baseline and stable Postop Assessment: no signs of nausea or vomiting Anesthetic complications: no    Last Vitals:  Filed Vitals:   01/27/15 1945 01/27/15 2000  BP:  128/96  Pulse: 55 68  Temp:    Resp:  16    Last Pain:  Filed Vitals:   01/27/15 2001  PainSc: [place] Edward Turk

## 2015-01-27 NOTE — Progress Notes (Signed)
PATIENT DETAILS Name: [name] Age: 64 y.o. Sex: male Date of Birth: Apr 26, 1950 Admit Date: 01/23/2015 Admitting Physician Evalee Mutton Kristeen Mans, MD [id number] M, MD  Subjective: Left arm unchanged-for bypass today-wants to go home tomorrow.  Assessment/Plan: Left upper arm ischemia:underewent Angiogram on 12/14-following which recommendations were to place on IV Heparin gtt and tentatively undergo a bypass on 12/19-however patient signed out [place] on 12/14.Returned on 12/15 to get readmitted. Seen by [facility]-bypass scheduled for 12/19, continue IV heparin. LUE is warm and remains unchanged since admission.  Dyslipidemia:continue Statin  COPD (chronic obstructive pulmonary disease):lungs clear-continue bronchodilators.  Atrial fibrillation:rate control with metoprolol and cardizem. On coumadin-but INR reversed recently for angiogram-will be on IV Heparin gtt-interested in starting Eliquis post bypass-will ask case management to do a benefit check.CHA2DS2-VASc Score is 5  [id number] Metoprolol and Cardizem  Depression with anxiety:More anxious today. Continue Klonopin and Zoloft.Add prn Klonopin as well.  Hypokalemia: Repleted  Disposition: Remain inpatient-home in next 1-2 days  Antimicrobial agents  See below  Anti-infectives    Start     Dose/Rate Route Frequency Ordered Stop   01/27/15 0600  cefUROXime (ZINACEF) 1.5 g in dextrose 5 % 50 mL IVPB     1.5 g 100 mL/hr over 30 Minutes Intravenous To ShortStay Procedural 01/24/15 0859 01/27/15 1307   01/27/15 0600  cefUROXime (ZINACEF) 1.5 g in dextrose 5 % 50 mL IVPB  Status:  Discontinued     1.5 g 100 mL/hr over 30 Minutes Intravenous On call to O.R. 01/24/15 0859 01/24/15 0913      DVT Prophylaxis: IV Heparin   Code Status: Full code   Family Communication None at bedside  Procedures: None  CONSULTS:  vascular surgery  Time spent 20 minutes-Greater than 50% of this time was spent  in counseling, explanation of diagnosis, planning of further management, and coordination of care.  MEDICATIONS: Scheduled Meds: . [MAR Hold] acetaminophen  1,000 mg Oral BID  . [MAR Hold] atorvastatin  40 mg Oral Daily  . [MAR Hold] clonazePAM  1 mg Oral TID  . [MAR Hold] diltiazem  240 mg Oral Daily  . [MAR Hold] gabapentin  100 mg Oral QHS  . [MAR Hold] metoprolol  100 mg Oral BID  . [MAR Hold] senna  1 tablet Oral BID  . [MAR Hold] sertraline  50 mg Oral QHS  . [MAR Hold] sodium chloride  3 mL Intravenous Q12H   Continuous Infusions: . heparin Stopped (01/27/15 0801)  . lactated ringers 10 mL/hr at 01/27/15 1243   PRN Meds:.[MAR Hold] sodium chloride, 0.9 % irrigation (POUR BTL), 0.9 % irrigation (POUR BTL), [MAR Hold] acetaminophen **OR** [MAR Hold] acetaminophen, [MAR Hold] albuterol, [MAR Hold] clonazePAM, [MAR Hold] guaiFENesin-dextromethorphan, heparin 6000 unit irrigation, [MAR Hold]  morphine injection, [MAR Hold] ondansetron **OR** [MAR Hold] ondansetron (ZOFRAN) IV, [MAR Hold] oxyCODONE, [MAR Hold] oxyCODONE-acetaminophen, [MAR Hold] sodium chloride    PHYSICAL EXAM: Vital signs in last 24 hours: Filed Vitals:   01/26/15 0628 01/26/15 1322 01/26/15 2119 01/27/15 0548  BP: 131/92 141/99 110/50 112/72  Pulse: 89 69 59 69  Temp: 97.8 F (36.6 C) 97.7 F (36.5 C) 97.7 F (36.5 C) 97.6 F (36.4 C)  TempSrc: Oral Oral Oral Oral  Resp: 18 20 16 16   Height:      Weight:      SpO2: 99% 100% 99% 100%    Weight change:  [employer]  01/23/15 1901  Weight: 77.293 kg (170 lb 6.4 oz)   Body mass index is 24.45 kg/(m^2).   Gen Exam: Awake and alert with clear speech.  Not in any distress Neck: Supple, No JVD.   Chest: B/L Clear.  No rales CVS: S1 S2 Regular, no murmurs.  Abdomen: soft, BS +, non tender, non distended.  Extremities: no edema, lower extremities warm to touch. Left upper extremity appears warm and essentially unchanged from 12/15 Neurologic: Non  Focal.   Skin: No Rash.   Wounds: N/A.   Intake/Output from previous day:  Intake/Output Summary (Last 24 hours) at 01/27/15 1646 Last data filed at 01/27/15 1625  Gross per 24 hour  Intake 1565.5 ml  Output    335 ml  Net 1230.5 ml     LAB RESULTS: CBC  Recent Labs Lab 01/20/15 2251  01/23/15 1605 01/24/15 0510 01/25/15 0515 01/26/15 0525 01/27/15 0629  WBC 8.6  < > 8.3 8.9 7.9 10.5 7.3  HGB 14.9  < > 15.1 15.1 13.7 14.2 14.4  HCT 43.7  < > 42.4 44.5 41.0 42.0 43.4  PLT 323  < > 290 274 242 240 232  MCV 89.7  < > 89.1 91.0 91.7 92.1 92.5  MCH 30.6  < > 31.7 30.9 30.6 31.1 30.7  MCHC 34.1  < > 35.6 33.9 33.4 33.8 33.2  RDW 12.7  < > 12.8 13.1 13.1 13.2 13.3  LYMPHSABS 2.1  --  2.8  --   --   --   --   MONOABS 0.5  --  0.6  --   --   --   --   EOSABS 0.0  --  0.0  --   --   --   --   BASOSABS 0.0  --  0.0  --   --   --   --   < > = values in this interval not displayed.  Chemistries   Recent Labs Lab 01/21/15 0438 01/22/15 0029 01/23/15 1605 01/24/15 0510 01/27/15 0629  NA 139 139 141 142 140  K 3.7 3.6 3.1* 3.7 3.6  CL 105 106 105 104 102  CO2 27 27 26 30 29   GLUCOSE 93 98 107* 100* 89  BUN 14 13 6 9 9   CREATININE 0.84 0.89 0.88 0.93 0.94  CALCIUM 9.3 9.1 9.7 10.2 9.7    CBG:  Recent Labs Lab 01/21/15 0817 01/22/15 0615  GLUCAP 95 82    GFR Estimated Creatinine Clearance: 80.9 mL/min (by C-G formula based on Cr of 0.94).  Coagulation profile  Recent Labs Lab 01/21/15 1226 01/21/15 1630 01/22/15 0029 01/23/15 1525 01/27/15 0629  INR 2.36* 1.53* 1.33 1.20 1.18    Cardiac Enzymes No results for input(s): CKMB, TROPONINI, MYOGLOBIN in the last 168 hours.  Invalid input(s): CK  Invalid input(s): POCBNP No results for input(s): DDIMER in the last 72 hours. No results for input(s): HGBA1C in the last 72 hours. No results for input(s): CHOL, HDL, LDLCALC, TRIG, CHOLHDL, LDLDIRECT in the last 72 hours. No results for input(s): TSH,  T4TOTAL, T3FREE, THYROIDAB in the last 72 hours.  Invalid input(s): FREET3 No results for input(s): VITAMINB12, FOLATE, FERRITIN, TIBC, IRON, RETICCTPCT in the last 72 hours. No results for input(s): LIPASE, AMYLASE in the last 72 hours.  Urine Studies No results for input(s): UHGB, CRYS in the last 72 hours.  Invalid input(s): UACOL, UAPR, USPG, UPH, UTP, UGL, UKET, UBIL, UNIT, UROB, ULEU, UEPI, UWBC, URBC, UBAC, CAST, UCOM, BILUA  MICROBIOLOGY: Recent Results (from the past 240 hour(s))  Surgical pcr screen     Status: None   Collection Time: 01/27/15  5:07 AM  Result Value Ref Range Status   MRSA, PCR NEGATIVE NEGATIVE Final   Staphylococcus aureus NEGATIVE NEGATIVE Final    Comment:        The Xpert SA Assay (FDA approved for NASAL specimens in patients over 21 years of age), is one component of a comprehensive surveillance program.  Test performance has been validated by [facility] for patients greater than or equal to 83 year old. It is not intended to diagnose infection nor to guide or monitor treatment.     RADIOLOGY STUDIES/RESULTS: No results found.  Oren Binet, MD  [facility] Pager:[phone]  If 7PM-7AM, please contact night-coverage [email or web address] Password [email or web address] 01/27/2015, 4:46 PM   LOS: 4 days

## 2015-01-27 NOTE — Care Management Important Message (Signed)
Important Message  Patient Details  Name: [name] MRN: [id number] Date of Birth: May 24, 1950   Medicare Important Message Given:  Yes    Nathen May 01/27/2015, 12:24 PM

## 2015-01-27 NOTE — Anesthesia Preprocedure Evaluation (Addendum)
Anesthesia Evaluation  Patient identified by MRN, date of birth, ID band Patient awake    Reviewed: Allergy & Precautions, H&P , NPO status , Patient's Chart, lab work & pertinent test results, reviewed documented beta blocker date and time   Airway Mallampati: II  TM Distance: >3 FB Neck ROM: Full    Dental no notable dental hx. (+) Poor Dentition, Dental Advisory Given   Pulmonary COPD,  COPD inhaler, former smoker,    Pulmonary exam normal breath sounds clear to auscultation- rhonchi       Cardiovascular hypertension, Pt. on medications and Pt. on home beta blockers + Peripheral Vascular Disease  Atrial Fibrillation  Rhythm:Irregular Rate:Normal     Neuro/Psych  Headaches, CVA, No Residual Symptoms negative psych ROS   GI/Hepatic negative GI ROS, Neg liver ROS,   Endo/Other  negative endocrine ROS  Renal/GU Renal diseasenegative Renal ROS  negative genitourinary   Musculoskeletal   Abdominal   Peds  Hematology negative hematology ROS (+)   Anesthesia Other Findings   Reproductive/Obstetrics negative OB ROS                            Anesthesia Physical Anesthesia Plan  ASA: III  Anesthesia Plan: General   Post-op Pain Management:    Induction: Intravenous  Airway Management Planned: Oral ETT  Additional Equipment:   Intra-op Plan:   Post-operative Plan: Extubation in OR  Informed Consent: I have reviewed the patients History and Physical, chart, labs and discussed the procedure including the risks, benefits and alternatives for the proposed anesthesia with the patient or authorized representative who has indicated his/her understanding and acceptance.   Dental advisory given  Plan Discussed with: CRNA  Anesthesia Plan Comments:         Anesthesia Quick Evaluation

## 2015-01-27 NOTE — Progress Notes (Signed)
Removed patient's son, Kori Mckee, from emergency contacts per patient's request.

## 2015-01-27 NOTE — H&P (View-Only) (Signed)
Patient name: [name] MRN: [id number] DOB: [birth year] Sex: male     Reason for referral:  Chief Complaint  Patient presents with  . Numbness    HISTORY OF PRESENT ILLNESS: Patient is a pleasant 64 year old gentleman with the history of left arm ischemia. He presented to the emergency room last night with some numbness and symptoms which were much milder than her prior events but was concerned. Of was called by the emergency room at approximately 11 PM was told that he had palpable pulse motor and sensory intact but was concerned about his numbness and was to be admitted by hospitalist service. This morning he is comfortable. Reports that his hand is not normal but is not have the level of ischemia to his prior presentations. He has had some persistent numbness since his initial surgery in February 2016. A time he had presented with the occlusion of his brachial artery. He underwent a saphenous vein bypass from his left axillary artery to his brachial artery with vein harvested from his left leg. He did well and then re-presented in October of this year with occlusion of the graft. He underwent thrombectomy of the graft and also had a thrombectomy of his radial and ulnar arteries. This was found to be sclerotic at that time. He had a Dacron patch of his brachial artery at that time. He has been on chronic Coumadin therapy related to this second event and has had good control of his anticoagulation. He reports that yesterday he noted some numbness and tingling in his hand. This improved throughout the day but was not completely back to normal and therefore he presented.  Past Medical History  Diagnosis Date  . Atrial fibrillation ([place])   . Hypertension   . Hyperlipidemia   . COPD (chronic obstructive pulmonary disease) ([place])   . Amaurosis fugax of left eye     history of   . Hx of echocardiogram     Echo (07/09/13):  Mild LVH, EF 55-60%, MAC, mild MR, severe LAE, mod RAE  .  Renal insufficiency   . Stroke [facility])     Past Surgical History  Procedure Laterality Date  . Embolectomy Left 04/05/2014    Procedure: THROMBO ENDARTERECTOMY OF LEFT BRACHIAL, RADIAL AND ULNAR ARTERY,  Left Radial artery cut down and radial artery thrombectomy.;  Surgeon: Elam Dutch, MD;  Location: [facility] OR;  Service: Vascular;  Laterality: Left;  . Patch angioplasty Left 04/05/2014    Procedure: LEFT ARM VEIN PATCH ANGIOPLASTY;  Surgeon: Elam Dutch, MD;  Location: [place];  Service: Vascular;  Laterality: Left;  . Arch aortogram N/A 04/05/2014    Procedure: ARCH AORTOGRAM, FIRST ORDER CATHETERIZATION LEFT SUBCLAVIAN ARTERY;  Surgeon: Elam Dutch, MD;  Location: [place];  Service: Vascular;  Laterality: N/A;  . Axillary-femoral bypass graft Left 04/08/2014    Procedure: LEFT AXILLARY ARTERY TO BRACHIAL ARTERY BYPASS USING NON REVERSE LEFT GREATER SAPHENOUS VEIN ,LIGATION OF LEFT AXILLARY ARTERY ANEURYSM;  Surgeon: Elam Dutch, MD;  Location: [place];  Service: Vascular;  Laterality: Left;  . Vein harvest Left 04/08/2014    Procedure: LEFT GREATER SAPHENOUS VEIN HARVEST;  Surgeon: Elam Dutch, MD;  Location: [place];  Service: Vascular;  Laterality: Left;  . Thrombectomy brachial artery Left 11/20/2014    Procedure: 1.  Thrombectomy Left Axilo-Brachial Bypass  2.  Thromboendarterecotmy of Left Brachial Artery with Fogarty Thrombectomy of Radial and Ulnar Arteries with Dacron patch angioplasty Left Brachial  Artery. 3. Intraoperative  Arteriogram times four.;  Surgeon: Mal Misty, MD;  Location: [facility] OR;  Service: Vascular;  Laterality: Left;    Social History   Social History  . Marital Status: Divorced    Spouse Name: N/A  . Number of Children: N/A  . 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 Smoking status: Former Smoker -- 2.00 packs/day for 45 years    Types: Cigarettes    Quit date: 11/25/2014  . Smokeless  tobacco: Never Used  . Alcohol Use: No  . Drug Use: No  . Sexual Activity: Not Currently   Other Topics Concern  . Not on file   Social History Narrative    Family History  Problem Relation Age of Onset  . Hypertension Mother   . Cancer Mother   . Cancer Father     gastric cancer  . Cancer Sister   . Dacia Capers death Neg Hx   . Heart disease Neg Hx   . Hyperlipidemia Neg Hx   . Kidney disease Neg Hx   . Stroke Neg Hx     Allergies as of 01/20/2015 - Review Complete 01/20/2015  Allergen Reaction Noted  . Aspirin Nausea And Vomiting 11/26/2009    No current facility-administered medications on file prior to encounter.   Current Outpatient Prescriptions on File Prior to Encounter  Medication Sig Dispense Refill  . albuterol (PROVENTIL HFA;VENTOLIN HFA) 108 (90 BASE) MCG/ACT inhaler Inhale 2 puffs into the lungs every 6 (six) hours as needed for wheezing or shortness of breath.    Marland Kitchen atorvastatin (LIPITOR) 40 MG tablet Take 1 tablet (40 mg total) by mouth daily. 90 tablet 3  . clonazePAM (KLONOPIN) 1 MG tablet Take 1 tablet (1 mg total) by mouth 3 (three) times daily. 75 tablet 2  . diltiazem (DILACOR XR) 240 MG 24 hr capsule Take 1 capsule (240 mg total) by mouth daily. 90 capsule 3  . gabapentin (NEURONTIN) 100 MG capsule Take 1 capsule (100 mg total) by mouth at bedtime.    . metoprolol (LOPRESSOR) 100 MG tablet Take 1 tablet (100 mg total) by mouth 2 (two) times daily. 180 tablet 1  . oxyCODONE-acetaminophen (PERCOCET/ROXICET) 5-325 MG tablet Take 1-2 tablets by mouth every 4 (four) hours as needed for moderate pain. 30 tablet 0  . sertraline (ZOLOFT) 50 MG tablet Take 1 tablet (50 mg total) by mouth at bedtime. (Patient taking differently: Take 50 mg by mouth at bedtime as needed. )    . warfarin (COUMADIN) 5 MG tablet Take 1 tablet (5 mg total) by mouth daily at 6 PM. Take as directed.  Dose may vary pending INR    . acetaminophen (TYLENOL) 500 MG tablet Take 1,000 mg by mouth  2 (two) times daily.       REVIEW OF SYSTEMS: Reviewed in his history and physical with no changes  PHYSICAL EXAMINATION:  General: The patient is a well-nourished male, in no acute distress. Vital signs are BP 131/92 mmHg  Pulse 82  Temp(Src) 97.7 F (36.5 C) (Oral)  Resp 16  Wt 169 lb 3.2 oz (76.749 kg)  SpO2 99% Pulmonary: There is a good air exchange Abdomen: Soft and non-tender  Musculoskeletal: There are no major deformities.  There is no significant extremity pain. Neurologic: No focal weakness or paresthesias are detected, Skin: There are no ulcer or rashes noted. Psychiatric: The patient has normal affect. Cardiovascular: He has  an easily palpable graft pulse in his mid upper arm. Faint pulse at the antecubital space and no pulses at the radial or ulnar arteries. Does have dampened monophasic Doppler flow at the radial and ulnar arteries  INR is 2.3.  Impression and Plan:  Recurrent ischemia of his left arm. Not profound as his prior to presentations in February and October. Discussed this at length with the patient. Recommend arteriography for further evaluation. Dr. Evelena Leyden note in October noted that he did have severe atherosclerotic changes of his radial and ulnar artery as well. May need a jump graft past the brachial artery anastomosis. Will keep nothing by mouth. Will give vitamin K to reverse INR and plan arteriogram later today and no surgery for probable for tomorrow    Charlissa Petros, Sherren Mocha [facility] [place] Office: [phone]

## 2015-01-27 NOTE — Anesthesia Procedure Notes (Signed)
Procedure Name: Intubation Date/Time: 01/27/2015 1:05 PM Performed by: Tressia Miners LEFFEW Pre-anesthesia Checklist: Patient identified, Patient being monitored, Timeout performed, Emergency Drugs available and Suction available Patient Re-evaluated:Patient Re-evaluated prior to inductionOxygen Delivery Method: Circle System Utilized Preoxygenation: Pre-oxygenation with 100% oxygen Intubation Type: IV induction Ventilation: Mask ventilation without difficulty and Oral airway inserted - appropriate to patient size Laryngoscope Size: Mac and 4 Grade View: Grade I Tube type: Oral Tube size: 7.5 mm Number of attempts: 1 Airway Equipment and Method: Stylet Placement Confirmation: ETT inserted through vocal cords under direct vision,  positive ETCO2 and breath sounds checked- equal and bilateral Secured at: 22 cm Tube secured with: Tape Dental Injury: Teeth and Oropharynx as per pre-operative assessment

## 2015-01-28 ENCOUNTER — Encounter (HOSPITAL_COMMUNITY): Payer: Self-pay | Admitting: Vascular Surgery

## 2015-01-28 LAB — BASIC METABOLIC PANEL
ANION GAP: 5 (ref 5–15)
BUN: 7 mg/dL (ref 6–20)
CALCIUM: 9.2 mg/dL (ref 8.9–10.3)
CHLORIDE: 107 mmol/L (ref 101–111)
CO2: 29 mmol/L (ref 22–32)
CREATININE: 0.89 mg/dL (ref 0.61–1.24)
GFR calc non Af Amer: >60 mL/min (ref >60)
Glucose, Bld: 118 mg/dL — ABNORMAL HIGH (ref 65–99)
Potassium: 3.6 mmol/L (ref 3.5–5.1)
SODIUM: 141 mmol/L (ref 135–145)

## 2015-01-28 LAB — CBC
HCT: 39 % (ref 39.0–52.0)
HEMATOCRIT: 38.6 % — AB (ref 39.0–52.0)
HEMOGLOBIN: 12.9 g/dL — AB (ref 13.0–17.0)
Hemoglobin: 13.1 g/dL (ref 13.0–17.0)
MCH: 30.8 pg (ref 26.0–34.0)
MCH: 31.4 pg (ref 26.0–34.0)
MCHC: 33.6 g/dL (ref 30.0–36.0)
MCHC: 33.4 g/dL (ref 30.0–36.0)
MCV: 92.1 fL (ref 78.0–100.0)
MCV: 93.5 fL (ref 78.0–100.0)
PLATELETS: 195 10*3/uL (ref 150–400)
Platelets: 183 10*3/uL (ref 150–400)
RBC: 4.17 MIL/uL — ABNORMAL LOW (ref 4.22–5.81)
RBC: 4.19 MIL/uL — ABNORMAL LOW (ref 4.22–5.81)
RDW: 13.3 % (ref 11.5–15.5)
RDW: 13.5 % (ref 11.5–15.5)
WBC: 8.6 10*3/uL (ref 4.0–10.5)
WBC: 9.1 10*3/uL (ref 4.0–10.5)

## 2015-01-28 NOTE — Progress Notes (Signed)
Staff from [facility] called, wanted to know his password for his XXX, informed I was not told. Asked if he would see his sister Diane. Went and asked the patient, states he does not know his password. He'd love to see his sister Shauna Hugh. Just does not want to see his son.

## 2015-01-28 NOTE — Progress Notes (Addendum)
  Progress Note    01/28/2015 7:37 AM 1 Day Post-Op  Subjective:  C/o hand being cold, but is much much better after surgery.  Afebrile HR 60's-70's Afib 0000000 systolic 0000000 RA  Filed Vitals:   01/28/15 0400 01/28/15 0441  BP: 106/85   Pulse: 77   Temp:  98.3 F (36.8 C)  Resp: 17     Physical Exam: Cardiac:  irregular Lungs:  Non labored Incisions:  Right leg incisions are covered and bandages dry.  Left arm wounds healing nicely Extremities:  + palpable left ulnar pulse; excellent doppler signals left ulnar and radial artery as well as bypass graft; motor and sensation are in tact.  CBC    Component Value Date/Time   WBC 8.6 01/28/2015 0518   RBC 4.17* 01/28/2015 0518   HGB 13.1 01/28/2015 0518   HCT 39.0 01/28/2015 0518   PLT 195 01/28/2015 0518   MCV 93.5 01/28/2015 0518   MCH 31.4 01/28/2015 0518   MCHC 33.6 01/28/2015 0518   RDW 13.5 01/28/2015 0518   LYMPHSABS 2.8 01/23/2015 1605   MONOABS 0.6 01/23/2015 1605   EOSABS 0.0 01/23/2015 1605   BASOSABS 0.0 01/23/2015 1605    BMET    Component Value Date/Time   NA 141 01/28/2015 0028   K 3.6 01/28/2015 0028   CL 107 01/28/2015 0028   CO2 29 01/28/2015 0028   GLUCOSE 118* 01/28/2015 0028   BUN 7 01/28/2015 0028   CREATININE 0.89 01/28/2015 0028   CREATININE 1.23 08/07/2014 0913   CALCIUM 9.2 01/28/2015 0028   GFRNONAA >60 01/28/2015 0028   GFRNONAA >89 03/14/2014 1338   GFRAA >60 01/28/2015 0028   GFRAA >89 03/14/2014 1338    INR    Component Value Date/Time   INR 1.18 01/27/2015 0629   INR 4.5 01/09/2015 1344   INR 2.7 04/02/2010 1021     Intake/Output Summary (Last 24 hours) at 01/28/15 0737 Last data filed at 01/28/15 0700  Gross per 24 hour  Intake 3624.17 ml  Output    985 ml  Net 2639.17 ml     Assessment:  64 y.o. male is s/p:  Left brachial to ulnar artery bypass using reversed right greater saphenous vein 1 Day Post-Op  Plan: -pt with palpable left ulnar pulse and  excellent doppler signal left ulnar and radial signals -DVT prophylaxis:  Pt wants to be switched to Eliquis-when we tried last time, he did not have insurance and now has medicare.  Will consult Case Management to make sure his insurance will cover this. -high risk of bleeding-will restart anticoagulation back tomorrow -pt is anxious to get out of hospital for personal reasons to find his son and get his house back in order.  Will stay today.  Transfer to [place], PA-C [facility] [phone] 01/28/2015 7:37 AM   Pt left hand pink and warm significantly improved Will tranfer to [facility].  If no problems with bleeding today and pain controlled will d/c home tomorrow Will have care management assess to see if we can place him on eliquis rather than coumadin.  Ruta Hinds, MD [facility] [place] Office: [phone] Pager: [phone]

## 2015-01-28 NOTE — Progress Notes (Signed)
Spoke with Dr Oneida Alar on 12/19-[facility] will now be primary team. Hospitalist service will sign off.

## 2015-01-28 NOTE — Progress Notes (Signed)
CM received consult:Pt will need to dc on Eliquis. Appreciate your help in finding out if this is covered by insurance and if there is any assistance! CM placed benefits check, CM to f/u. Whitman Hero RN,BSN,CM [phone]

## 2015-01-28 NOTE — Progress Notes (Signed)
UR COMPLETED  

## 2015-01-29 LAB — CBC
HCT: 38.2 % — ABNORMAL LOW (ref 39.0–52.0)
Hemoglobin: 12.9 g/dL — ABNORMAL LOW (ref 13.0–17.0)
MCH: 31.5 pg (ref 26.0–34.0)
MCHC: 33.8 g/dL (ref 30.0–36.0)
MCV: 93.4 fL (ref 78.0–100.0)
PLATELETS: 182 10*3/uL (ref 150–400)
RBC: 4.09 MIL/uL — AB (ref 4.22–5.81)
RDW: 13.3 % (ref 11.5–15.5)
WBC: 8.8 10*3/uL (ref 4.0–10.5)

## 2015-01-29 LAB — PROTIME-INR
INR: 1.14 (ref 0.00–1.49)
PROTHROMBIN TIME: 14.7 s (ref 11.6–15.2)

## 2015-01-29 MED ORDER — WARFARIN - PHYSICIAN DOSING INPATIENT: Freq: Every day | Status: DC

## 2015-01-29 MED ORDER — OXYCODONE-ACETAMINOPHEN 5-325 MG PO TABS: 1.0000 | ORAL_TABLET | ORAL | Status: DC | PRN

## 2015-01-29 MED ORDER — WARFARIN SODIUM 5 MG PO TABS
5.0000 mg | ORAL_TABLET | Freq: Every day | ORAL | Status: DC
  Administered 2015-01-29: 5 mg via ORAL
  Filled 2015-01-29: qty 1

## 2015-01-29 NOTE — Discharge Summary (Signed)
Discharge Summary    [name] 04-Jun-1950 64 y.o. male  [id number]  Admission Date: 01/23/2015  Discharge Date: 01/29/15  Physician: Elam Dutch, MD  Admission Diagnosis: Limb ischemia [I99.8]   HPI:   This is a 64 y.o. male with the history of left arm ischemia. He presented to the emergency room last night with some numbness and symptoms which were much milder than her prior events but was concerned. Of was called by the emergency room at approximately 11 PM was told that he had palpable pulse motor and sensory intact but was concerned about his numbness and was to be admitted by hospitalist service. This morning he is comfortable. Reports that his hand is not normal but is not have the level of ischemia to his prior presentations. He has had some persistent numbness since his initial surgery in February 2016. A time he had presented with the occlusion of his brachial artery. He underwent a saphenous vein bypass from his left axillary artery to his brachial artery with vein harvested from his left leg. He did well and then re-presented in October of this year with occlusion of the graft. He underwent thrombectomy of the graft and also had a thrombectomy of his radial and ulnar arteries. This was found to be sclerotic at that time. He had a Dacron patch of his brachial artery at that time. He has been on chronic Coumadin therapy related to this second event and has had good control of his anticoagulation. He reports that yesterday he noted some numbness and tingling in his hand. This improved throughout the day but was not completely back to normal and therefore he presented.  He left AMA due to personal family issues.  See d/c summary from 01/22/15.  Pt was scheduled for left arm bypass on 01/27/15.  He was readmitted the next day.  Hospital Course:  The patient was admitted to the hospital and started on heparin.  He was taken to the operating room on 01/27/2015 and  underwent: Left brachial to ulnar artery bypass using reversed right GSV.    The pt tolerated the procedure well and was transported to the PACU in good condition.   By POD 1, he was doing well with his left hand pink and warm, which was significantly improved.  He was transferred to [place].  By POD 2, he did have some mild forearm redness most likely from trauma of operation.  He continues to have a palpable graft pulse at the elbow and left hand is warm.  Numbness persists, but is improved from prior admission.  His coumadin will be restarted today.  He will get a dose prior to being discharged.  He is not able to afford Eliquis.  He is strongly encouraged to quit smoking.  The remainder of the hospital course consisted of increasing mobilization and increasing intake of solids without difficulty.  CBC    Component Value Date/Time   WBC 8.8 01/29/2015 0446   RBC 4.09* 01/29/2015 0446   HGB 12.9* 01/29/2015 0446   HCT 38.2* 01/29/2015 0446   PLT 182 01/29/2015 0446   MCV 93.4 01/29/2015 0446   MCH 31.5 01/29/2015 0446   MCHC 33.8 01/29/2015 0446   RDW 13.3 01/29/2015 0446   LYMPHSABS 2.8 01/23/2015 1605   MONOABS 0.6 01/23/2015 1605   EOSABS 0.0 01/23/2015 1605   BASOSABS 0.0 01/23/2015 1605    BMET    Component Value Date/Time   NA 141 01/28/2015 0028   K  3.6 01/28/2015 0028   CL 107 01/28/2015 0028   CO2 29 01/28/2015 0028   GLUCOSE 118* 01/28/2015 0028   BUN 7 01/28/2015 0028   CREATININE 0.89 01/28/2015 0028   CREATININE 1.23 08/07/2014 0913   CALCIUM 9.2 01/28/2015 0028   GFRNONAA >60 01/28/2015 0028   GFRNONAA >89 03/14/2014 1338   GFRAA >60 01/28/2015 0028   GFRAA >89 03/14/2014 1338      Discharge Instructions    Call MD for:  redness, tenderness, or signs of infection (pain, swelling, bleeding, redness, odor or green/yellow discharge around incision site)    Complete by:  As directed      Call MD for:  severe or increased pain, loss or decreased feeling   in affected limb(s)    Complete by:  As directed      Call MD for:  temperature >100.5    Complete by:  As directed      Driving Restrictions    Complete by:  As directed   No driving for 2 weeks and while taking pain medication     Lifting restrictions    Complete by:  As directed   No lifting for 3 weeks     Resume previous diet    Complete by:  As directed      may wash over wound with mild soap and water    Complete by:  As directed            Discharge Diagnosis:  Limb ischemia [I99.8]  Secondary Diagnosis: Patient Active Problem List   Diagnosis Date Noted  . Limb ischemia 01/23/2015  . Numbness 01/20/2015  . PAD (peripheral artery disease) ([place]) 12/10/2014  . Embolic stroke ([place]) [id number]  . Ischemia of extremity 11/20/2014  . Ischemia of hand 04/05/2014  . Special screening for malignant neoplasms, colon 03/20/2014  . Warfarin-induced coagulopathy ([place]) 03/20/2014  . Encounter for therapeutic drug monitoring 03/08/2013  . Headache 01/29/2013  . Prostate nodule with urinary obstruction 12/13/2012  . Routine general medical examination at a health care facility 12/13/2012  . Depression with anxiety 12/07/2012  . Long term (current) use of anticoagulants 04/30/2010  . HYPERCHOLESTEROLEMIA-PURE 04/22/2008  . TOBACCO ABUSE 04/22/2008  . HYPERTENSION, BENIGN ESSENTIAL 04/22/2008  . Atrial fibrillation ([place]) 04/22/2008  . COPD (chronic obstructive pulmonary disease) ([place]) 04/22/2008   Past Medical History  Diagnosis Date  . Atrial fibrillation ([place])   . Hypertension   . Hyperlipidemia   . COPD (chronic obstructive pulmonary disease) ([place])   . Amaurosis fugax of left eye     history of   . Hx of echocardiogram     Echo (07/09/13):  Mild LVH, EF 55-60%, MAC, mild MR, severe LAE, mod RAE  . Renal insufficiency   . Stroke [facility])        Medication List    TAKE these medications        acetaminophen 500 MG tablet  Commonly known as:  TYLENOL  Take 1,000 mg  by mouth 2 (two) times daily.     albuterol 108 (90 BASE) MCG/ACT inhaler  Commonly known as:  PROVENTIL HFA;VENTOLIN HFA  Inhale 2 puffs into the lungs every 6 (six) hours as needed for wheezing or shortness of breath.     atorvastatin 40 MG tablet  Commonly known as:  LIPITOR  Take 1 tablet (40 mg total) by mouth daily.     clonazePAM 1 MG tablet  Commonly known as:  KLONOPIN  Take 1 tablet (1 mg total)  by mouth 3 (three) times daily.     diltiazem 240 MG 24 hr capsule  Commonly known as:  DILACOR XR  Take 1 capsule (240 mg total) by mouth daily.     gabapentin 100 MG capsule  Commonly known as:  NEURONTIN  Take 1 capsule (100 mg total) by mouth at bedtime.     metoprolol 100 MG tablet  Commonly known as:  LOPRESSOR  Take 1 tablet (100 mg total) by mouth 2 (two) times daily.     oxyCODONE-acetaminophen 5-325 MG tablet  Commonly known as:  PERCOCET/ROXICET  Take 1-2 tablets by mouth every 4 (four) hours as needed for moderate pain.     sertraline 50 MG tablet  Commonly known as:  ZOLOFT  Take 1 tablet (50 mg total) by mouth at bedtime.     warfarin 5 MG tablet  Commonly known as:  COUMADIN  Take 1 tablet (5 mg total) by mouth daily at 6 PM. Take as directed.  Dose may vary pending INR        Prescriptions given: Percocet #30 No Refill  Instructions: 1.  No blood pressures in left arm 2.  Shower daily with soap and water starting 01/29/15.  3.  No heavy lifting until returning to see Dr. Oneida Alar in f/u 4.  Discussed importance of smoking cessation with pt  Disposition: home  Patient's condition: is Good  Follow up: 1. Dr. Oneida Alar in 2 weeks 2. [facility] 01/31/15 for INR check.  Would like to get this checked before the Christmas holiday instead of waiting until 02/04/15.   Leontine Locket, PA-C [facility] [phone] 01/29/2015  11:59 AM

## 2015-01-29 NOTE — Progress Notes (Addendum)
Progress Note    01/29/2015 8:24 AM 2 Days Post-Op  Subjective:  C/o numbness in his hand, but he states this is not any worse than pre op.  Tm 99.4 now afebrile HR 60's-70's afib [id number] systolic [id number] RA  Filed Vitals:   01/28/15 2010 01/29/15 0400  BP: 141/92 104/73  Pulse:  66  Temp:  98.5 F (36.9 C)  Resp: 18 18    Physical Exam: Cardiac:  irregular Lungs:  Non labored Incisions:   Left arm incisions are c/d/i-there is redness on the medial aspect over the graft; right leg incisions are clean and dry with staples in tact.  Extremities:  2-3+ palpable left arm graft pulse; motor and sensation are in tact left hand   CBC    Component Value Date/Time   WBC 8.8 01/29/2015 0446   RBC 4.09* 01/29/2015 0446   HGB 12.9* 01/29/2015 0446   HCT 38.2* 01/29/2015 0446   PLT 182 01/29/2015 0446   MCV 93.4 01/29/2015 0446   MCH 31.5 01/29/2015 0446   MCHC 33.8 01/29/2015 0446   RDW 13.3 01/29/2015 0446   LYMPHSABS 2.8 01/23/2015 1605   MONOABS 0.6 01/23/2015 1605   EOSABS 0.0 01/23/2015 1605   BASOSABS 0.0 01/23/2015 1605    BMET    Component Value Date/Time   NA 141 01/28/2015 0028   K 3.6 01/28/2015 0028   CL 107 01/28/2015 0028   CO2 29 01/28/2015 0028   GLUCOSE 118* 01/28/2015 0028   BUN 7 01/28/2015 0028   CREATININE 0.89 01/28/2015 0028   CREATININE 1.23 08/07/2014 0913   CALCIUM 9.2 01/28/2015 0028   GFRNONAA >60 01/28/2015 0028   GFRNONAA >89 03/14/2014 1338   GFRAA >60 01/28/2015 0028   GFRAA >89 03/14/2014 1338    INR    Component Value Date/Time   INR 1.18 01/27/2015 0629   INR 4.5 01/09/2015 1344   INR 2.7 04/02/2010 1021     Intake/Output Summary (Last 24 hours) at 01/29/15 0824 Last data filed at 01/28/15 1700  Gross per 24 hour  Intake    980 ml  Output      0 ml  Net    980 ml     Assessment:  64 y.o. male is s/p:  Left brachial to ulnar artery bypass using reversed right greater saphenous vein  2 Days  Post-Op  Plan: -pt doing well with palpable graft pulse -there is redness over the medial portion of the arm over the graft-had low grade fever yesterday, but has been afebrile since.  may need prophylactic Abx-will d/w Dr. Oneida Alar -strongly encouraged pt to stop smoking -DVT prophylaxis and Afib:  Start Eliquis today-pt states he has a 30 day supply.  Awaiting case management to make sure this is covered by insurance. -possibly home later today   Leontine Locket, PA-C [facility] [phone] 01/29/2015 8:24 AM   Mild forearm redness most likely from trauma of operation Palpable graft pulse at elbow, left hand warm.  Numbness persists but overall improved from prior to admission. Pt had BP cuff checked in left arm today.  Told him to avoid this from now on.  Restart coumadin.  Pt not able to afford Eliquis.  D/c home follow up INR next Monday  Ruta Hinds, MD [facility] [place] Office: [phone] Pager: [phone]   Cardiology office is closed on Monday.  I have scheduled an INR check with [facility] 01/31/15 for 0945 as they are closed Friday  afternoon.  Spoke with Elberta Fortis.  Leontine Locket 01/29/2015 11:58 AM

## 2015-01-29 NOTE — Progress Notes (Signed)
CSW consulted for transportation needs.  CSW met with pt to discuss- pt is able to call his neighbor, Alvester Chou, for a ride.  Pt called Alvester Chou while CSW in the room- Alvester Chou will come to transport pt home.  CSW signing off.  Domenica Reamer, [place] Social Worker [phone]

## 2015-01-29 NOTE — Progress Notes (Signed)
Patient given discharge instructions and education. No questions or concerns at this time.
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
## 2015-01-31 ENCOUNTER — Telehealth: Payer: Self-pay | Admitting: Vascular Surgery

## 2015-01-31 NOTE — Telephone Encounter (Signed)
-----   Message from Mena Goes, RN sent at 01/29/2015  1:13 PM EST ----- Regarding: schedule   ----- Message -----    From: Gabriel Earing, PA-C    Sent: 01/29/2015  11:46 AM      To: [facility]vs Charge Pool  S/p Left brachial to ulnar artery bypass using reversed right greater saphenous vein .  F/u with Dr. Oneida Alar in 2 weeks.  Thanks

## 2015-01-31 NOTE — Telephone Encounter (Signed)
LM or pt re appt, dpm °

## 2015-02-07 ENCOUNTER — Encounter (HOSPITAL_COMMUNITY): Payer: Self-pay | Admitting: Emergency Medicine

## 2015-02-07 ENCOUNTER — Emergency Department (HOSPITAL_COMMUNITY)
Admission: EM | Admit: 2015-02-07 | Discharge: 2015-02-07 | Disposition: A | Discharge status: Home / Self Care | Payer: Medicare Other | Attending: Emergency Medicine | Admitting: Emergency Medicine

## 2015-02-07 DIAGNOSIS — E785 Hyperlipidemia, unspecified: Secondary | ICD-10-CM | POA: Insufficient documentation

## 2015-02-07 DIAGNOSIS — Z8673 Personal history of transient ischemic attack (TIA), and cerebral infarction without residual deficits: Secondary | ICD-10-CM | POA: Insufficient documentation

## 2015-02-07 DIAGNOSIS — R112 Nausea with vomiting, unspecified: Secondary | ICD-10-CM

## 2015-02-07 DIAGNOSIS — R630 Anorexia: Secondary | ICD-10-CM | POA: Insufficient documentation

## 2015-02-07 DIAGNOSIS — I10 Essential (primary) hypertension: Secondary | ICD-10-CM | POA: Diagnosis not present

## 2015-02-07 DIAGNOSIS — I4891 Unspecified atrial fibrillation: Secondary | ICD-10-CM | POA: Diagnosis not present

## 2015-02-07 DIAGNOSIS — Z8669 Personal history of other diseases of the nervous system and sense organs: Secondary | ICD-10-CM | POA: Diagnosis not present

## 2015-02-07 DIAGNOSIS — Z79899 Other long term (current) drug therapy: Secondary | ICD-10-CM | POA: Insufficient documentation

## 2015-02-07 DIAGNOSIS — Z87891 Personal history of nicotine dependence: Secondary | ICD-10-CM | POA: Insufficient documentation

## 2015-02-07 DIAGNOSIS — R5383 Other fatigue: Secondary | ICD-10-CM | POA: Diagnosis not present

## 2015-02-07 DIAGNOSIS — R197 Diarrhea, unspecified: Secondary | ICD-10-CM | POA: Insufficient documentation

## 2015-02-07 DIAGNOSIS — Z7901 Long term (current) use of anticoagulants: Secondary | ICD-10-CM | POA: Diagnosis not present

## 2015-02-07 DIAGNOSIS — Z87448 Personal history of other diseases of urinary system: Secondary | ICD-10-CM | POA: Insufficient documentation

## 2015-02-07 DIAGNOSIS — J449 Chronic obstructive pulmonary disease, unspecified: Secondary | ICD-10-CM | POA: Diagnosis not present

## 2015-02-07 DIAGNOSIS — R4182 Altered mental status, unspecified: Secondary | ICD-10-CM | POA: Diagnosis present

## 2015-02-07 LAB — CBC
HCT: 44.4 % (ref 39.0–52.0)
HEMOGLOBIN: 14.8 g/dL (ref 13.0–17.0)
MCH: 30.8 pg (ref 26.0–34.0)
MCHC: 33.3 g/dL (ref 30.0–36.0)
MCV: 92.5 fL (ref 78.0–100.0)
PLATELETS: 475 10*3/uL — AB (ref 150–400)
RBC: 4.8 MIL/uL (ref 4.22–5.81)
RDW: 13.2 % (ref 11.5–15.5)
WBC: 11.7 10*3/uL — ABNORMAL HIGH (ref 4.0–10.5)

## 2015-02-07 LAB — COMPREHENSIVE METABOLIC PANEL
ALK PHOS: 74 U/L (ref 38–126)
ALT: 15 U/L — AB (ref 17–63)
ANION GAP: 13 (ref 5–15)
AST: 23 U/L (ref 15–41)
Albumin: 4.9 g/dL (ref 3.5–5.0)
BILIRUBIN TOTAL: 0.7 mg/dL (ref 0.3–1.2)
BUN: 12 mg/dL (ref 6–20)
CALCIUM: 9.9 mg/dL (ref 8.9–10.3)
CO2: 26 mmol/L (ref 22–32)
CREATININE: 1.11 mg/dL (ref 0.61–1.24)
Chloride: 99 mmol/L — ABNORMAL LOW (ref 101–111)
Glucose, Bld: 126 mg/dL — ABNORMAL HIGH (ref 65–99)
Potassium: 3.3 mmol/L — ABNORMAL LOW (ref 3.5–5.1)
SODIUM: 138 mmol/L (ref 135–145)
TOTAL PROTEIN: 8.2 g/dL — AB (ref 6.5–8.1)

## 2015-02-07 LAB — CBG MONITORING, ED: GLUCOSE-CAPILLARY: 114 mg/dL — AB (ref 65–99)

## 2015-02-07 LAB — PROTIME-INR
INR: 3.83 — ABNORMAL HIGH (ref 0.00–1.49)
PROTHROMBIN TIME: 36.8 s — AB (ref 11.6–15.2)

## 2015-02-07 LAB — LIPASE, BLOOD: Lipase: 24 U/L (ref 11–51)

## 2015-02-07 MED ORDER — ONDANSETRON 4 MG PO TBDP: 4.0000 mg | ORAL_TABLET | Freq: Three times a day (TID) | ORAL | Status: DC | PRN

## 2015-02-07 MED ORDER — SODIUM CHLORIDE 0.9 % IV BOLUS (SEPSIS)
500.0000 mL | Freq: Once | INTRAVENOUS | Status: AC
  Administered 2015-02-07: 500 mL via INTRAVENOUS

## 2015-02-07 MED ORDER — ONDANSETRON HCL 4 MG/2ML IJ SOLN
4.0000 mg | Freq: Once | INTRAMUSCULAR | Status: AC
  Administered 2015-02-07: 4 mg via INTRAVENOUS
  Filled 2015-02-07: qty 2
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
## 2015-02-07 NOTE — Discharge Instructions (Signed)

## 2015-02-07 NOTE — ED Notes (Signed)
PT DISCHARGED. INSTRUCTIONS AND PRESCRIPTION GIVEN. AAOX3. PT IN NO APPARENT DISTRESS OR PAIN. THE OPPORTUNITY TO ASK QUESTIONS WAS PROVIDED. 

## 2015-02-08 ENCOUNTER — Emergency Department (HOSPITAL_COMMUNITY)
Admission: EM | Admit: 2015-02-08 | Discharge: 2015-02-09 | Disposition: A | Discharge status: Other Acute Inpatient Hospital | Payer: Medicare Other | Attending: Emergency Medicine | Admitting: Emergency Medicine

## 2015-02-08 ENCOUNTER — Encounter (HOSPITAL_COMMUNITY): Payer: Self-pay | Admitting: *Deleted

## 2015-02-08 DIAGNOSIS — I10 Essential (primary) hypertension: Secondary | ICD-10-CM | POA: Diagnosis not present

## 2015-02-08 DIAGNOSIS — Z79899 Other long term (current) drug therapy: Secondary | ICD-10-CM | POA: Diagnosis not present

## 2015-02-08 DIAGNOSIS — R45851 Suicidal ideations: Secondary | ICD-10-CM | POA: Diagnosis not present

## 2015-02-08 DIAGNOSIS — F329 Major depressive disorder, single episode, unspecified: Secondary | ICD-10-CM | POA: Insufficient documentation

## 2015-02-08 DIAGNOSIS — Z87448 Personal history of other diseases of urinary system: Secondary | ICD-10-CM | POA: Insufficient documentation

## 2015-02-08 DIAGNOSIS — Z87891 Personal history of nicotine dependence: Secondary | ICD-10-CM | POA: Insufficient documentation

## 2015-02-08 DIAGNOSIS — J449 Chronic obstructive pulmonary disease, unspecified: Secondary | ICD-10-CM | POA: Diagnosis not present

## 2015-02-08 DIAGNOSIS — F332 Major depressive disorder, recurrent severe without psychotic features: Secondary | ICD-10-CM | POA: Diagnosis not present

## 2015-02-08 DIAGNOSIS — Z8673 Personal history of transient ischemic attack (TIA), and cerebral infarction without residual deficits: Secondary | ICD-10-CM | POA: Insufficient documentation

## 2015-02-08 DIAGNOSIS — E785 Hyperlipidemia, unspecified: Secondary | ICD-10-CM | POA: Insufficient documentation

## 2015-02-08 DIAGNOSIS — I4891 Unspecified atrial fibrillation: Secondary | ICD-10-CM | POA: Diagnosis not present

## 2015-02-08 DIAGNOSIS — F32A Depression, unspecified: Secondary | ICD-10-CM

## 2015-02-08 DIAGNOSIS — Z8669 Personal history of other diseases of the nervous system and sense organs: Secondary | ICD-10-CM | POA: Diagnosis not present

## 2015-02-08 DIAGNOSIS — Z7901 Long term (current) use of anticoagulants: Secondary | ICD-10-CM | POA: Diagnosis not present

## 2015-02-08 DIAGNOSIS — F1210 Cannabis abuse, uncomplicated: Secondary | ICD-10-CM | POA: Insufficient documentation

## 2015-02-08 DIAGNOSIS — R45850 Homicidal ideations: Secondary | ICD-10-CM

## 2015-02-08 DIAGNOSIS — T8189XA Other complications of procedures, not elsewhere classified, initial encounter: Secondary | ICD-10-CM | POA: Diagnosis not present

## 2015-02-08 DIAGNOSIS — M79604 Pain in right leg: Secondary | ICD-10-CM | POA: Diagnosis not present

## 2015-02-08 LAB — CBC WITH DIFFERENTIAL/PLATELET
Basophils Absolute: 0 10*3/uL (ref 0.0–0.1)
Basophils Relative: 0 %
EOS ABS: 0 10*3/uL (ref 0.0–0.7)
Eosinophils Relative: 0 %
HCT: 41.4 % (ref 39.0–52.0)
HEMOGLOBIN: 14.1 g/dL (ref 13.0–17.0)
LYMPHS ABS: 2.4 10*3/uL (ref 0.7–4.0)
Lymphocytes Relative: 22 %
MCH: 30.7 pg (ref 26.0–34.0)
MCHC: 34.1 g/dL (ref 30.0–36.0)
MCV: 90 fL (ref 78.0–100.0)
MONO ABS: 0.5 10*3/uL (ref 0.1–1.0)
MONOS PCT: 5 %
NEUTROS PCT: 73 %
Neutro Abs: 7.7 10*3/uL (ref 1.7–7.7)
Platelets: 396 10*3/uL (ref 150–400)
RBC: 4.6 MIL/uL (ref 4.22–5.81)
RDW: 13.1 % (ref 11.5–15.5)
WBC: 10.6 10*3/uL — ABNORMAL HIGH (ref 4.0–10.5)

## 2015-02-08 LAB — COMPREHENSIVE METABOLIC PANEL
ALK PHOS: 65 U/L (ref 38–126)
ALT: 12 U/L — ABNORMAL LOW (ref 17–63)
ANION GAP: 9 (ref 5–15)
AST: 20 U/L (ref 15–41)
Albumin: 4.2 g/dL (ref 3.5–5.0)
BILIRUBIN TOTAL: 0.7 mg/dL (ref 0.3–1.2)
BUN: 12 mg/dL (ref 6–20)
CALCIUM: 9.6 mg/dL (ref 8.9–10.3)
CO2: 29 mmol/L (ref 22–32)
Chloride: 104 mmol/L (ref 101–111)
Creatinine, Ser: 0.98 mg/dL (ref 0.61–1.24)
GFR calc non Af Amer: >60 mL/min (ref >60)
Glucose, Bld: 109 mg/dL — ABNORMAL HIGH (ref 65–99)
POTASSIUM: 3.3 mmol/L — AB (ref 3.5–5.1)
SODIUM: 142 mmol/L (ref 135–145)
TOTAL PROTEIN: 7.3 g/dL (ref 6.5–8.1)

## 2015-02-08 LAB — PROTIME-INR
INR: 3.4 — AB (ref 0.00–1.49)
Prothrombin Time: 33.7 seconds — ABNORMAL HIGH (ref 11.6–15.2)

## 2015-02-08 LAB — LIPASE, BLOOD: LIPASE: 27 U/L (ref 11–51)

## 2015-02-08 MED ORDER — ONDANSETRON HCL 4 MG/2ML IJ SOLN
4.0000 mg | Freq: Once | INTRAMUSCULAR | Status: AC
  Administered 2015-02-08: 4 mg via INTRAVENOUS
  Filled 2015-02-08: qty 2

## 2015-02-08 MED ORDER — SODIUM CHLORIDE 0.9 % IV BOLUS (SEPSIS)
1000.0000 mL | Freq: Once | INTRAVENOUS | Status: AC
  Administered 2015-02-08: 1000 mL via INTRAVENOUS

## 2015-02-08 MED ORDER — METOPROLOL TARTRATE 25 MG PO TABS
100.0000 mg | ORAL_TABLET | Freq: Once | ORAL | Status: AC
  Administered 2015-02-08: 100 mg via ORAL
  Filled 2015-02-08: qty 4

## 2015-02-08 NOTE — BH Assessment (Signed)
Assessment completed. Consulted Darlyne Russian, PA-C who recommended inpatient treatment once pt is medically cleared. Informed [place] Lions, PA-C of the recommendation.

## 2015-02-08 NOTE — ED Notes (Signed)
TTS consult in progress. °

## 2015-02-08 NOTE — ED Notes (Signed)
Bed: [id number] Expected date:  Expected time:  Means of arrival:  Comments: EMS 64 yo male from home, acute depression, is not eating or drinking, not taking meds, leg pains
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
## 2015-02-08 NOTE — ED Notes (Addendum)
Delay in lab draw TTS in room at this time
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
## 2015-02-09 ENCOUNTER — Inpatient Hospital Stay (HOSPITAL_COMMUNITY)
Admission: AD | Admit: 2015-02-09 | Discharge: 2015-02-12 | DRG: 885 | Disposition: A | Discharge status: Home / Self Care | Payer: Medicare Other | Source: Intra-hospital | Attending: Psychiatry | Admitting: Psychiatry

## 2015-02-09 ENCOUNTER — Encounter (HOSPITAL_COMMUNITY): Payer: Self-pay | Admitting: *Deleted

## 2015-02-09 DIAGNOSIS — Z8249 Family history of ischemic heart disease and other diseases of the circulatory system: Secondary | ICD-10-CM | POA: Diagnosis not present

## 2015-02-09 DIAGNOSIS — Z8673 Personal history of transient ischemic attack (TIA), and cerebral infarction without residual deficits: Secondary | ICD-10-CM | POA: Diagnosis not present

## 2015-02-09 DIAGNOSIS — F322 Major depressive disorder, single episode, severe without psychotic features: Secondary | ICD-10-CM | POA: Diagnosis not present

## 2015-02-09 DIAGNOSIS — R45851 Suicidal ideations: Secondary | ICD-10-CM | POA: Diagnosis not present

## 2015-02-09 DIAGNOSIS — Z800 Family history of malignant neoplasm of digestive organs: Secondary | ICD-10-CM

## 2015-02-09 DIAGNOSIS — J449 Chronic obstructive pulmonary disease, unspecified: Secondary | ICD-10-CM | POA: Diagnosis present

## 2015-02-09 DIAGNOSIS — F332 Major depressive disorder, recurrent severe without psychotic features: Secondary | ICD-10-CM | POA: Diagnosis present

## 2015-02-09 DIAGNOSIS — Z818 Family history of other mental and behavioral disorders: Secondary | ICD-10-CM | POA: Diagnosis not present

## 2015-02-09 DIAGNOSIS — Z23 Encounter for immunization: Secondary | ICD-10-CM

## 2015-02-09 DIAGNOSIS — I998 Other disorder of circulatory system: Secondary | ICD-10-CM | POA: Diagnosis present

## 2015-02-09 DIAGNOSIS — I4891 Unspecified atrial fibrillation: Secondary | ICD-10-CM | POA: Diagnosis present

## 2015-02-09 DIAGNOSIS — F419 Anxiety disorder, unspecified: Secondary | ICD-10-CM | POA: Diagnosis present

## 2015-02-09 DIAGNOSIS — E7800 Pure hypercholesterolemia, unspecified: Secondary | ICD-10-CM | POA: Diagnosis present

## 2015-02-09 DIAGNOSIS — E785 Hyperlipidemia, unspecified: Secondary | ICD-10-CM | POA: Diagnosis present

## 2015-02-09 DIAGNOSIS — Z7901 Long term (current) use of anticoagulants: Secondary | ICD-10-CM

## 2015-02-09 DIAGNOSIS — F329 Major depressive disorder, single episode, unspecified: Secondary | ICD-10-CM | POA: Diagnosis not present

## 2015-02-09 DIAGNOSIS — G4710 Hypersomnia, unspecified: Secondary | ICD-10-CM | POA: Diagnosis present

## 2015-02-09 DIAGNOSIS — R45850 Homicidal ideations: Secondary | ICD-10-CM | POA: Diagnosis not present

## 2015-02-09 DIAGNOSIS — G4700 Insomnia, unspecified: Secondary | ICD-10-CM | POA: Diagnosis present

## 2015-02-09 DIAGNOSIS — L03115 Cellulitis of right lower limb: Secondary | ICD-10-CM | POA: Diagnosis not present

## 2015-02-09 DIAGNOSIS — Z87891 Personal history of nicotine dependence: Secondary | ICD-10-CM | POA: Diagnosis not present

## 2015-02-09 DIAGNOSIS — I10 Essential (primary) hypertension: Secondary | ICD-10-CM | POA: Diagnosis present

## 2015-02-09 LAB — ACETAMINOPHEN LEVEL: Acetaminophen (Tylenol), Serum: <10 ug/mL — ABNORMAL LOW (ref 10–30)

## 2015-02-09 LAB — RAPID URINE DRUG SCREEN, HOSP PERFORMED
Amphetamines: NOT DETECTED
BENZODIAZEPINES: NOT DETECTED
Barbiturates: NOT DETECTED
COCAINE: NOT DETECTED
OPIATES: NOT DETECTED
Tetrahydrocannabinol: POSITIVE — AB

## 2015-02-09 LAB — ETHANOL

## 2015-02-09 LAB — TSH: TSH: 0.28 u[IU]/mL — AB (ref 0.350–4.500)

## 2015-02-09 LAB — PROTIME-INR
INR: 3.49 — AB (ref 0.00–1.49)
PROTHROMBIN TIME: 34.3 s — AB (ref 11.6–15.2)

## 2015-02-09 LAB — SALICYLATE LEVEL: Salicylate Lvl: <4 mg/dL (ref 2.8–30.0)

## 2015-02-09 MED ORDER — DILTIAZEM HCL ER 240 MG PO CP24
240.0000 mg | ORAL_CAPSULE | Freq: Every day | ORAL | Status: DC
  Administered 2015-02-09 – 2015-02-11 (×3): 240 mg via ORAL
  Filled 2015-02-09 (×7): qty 1

## 2015-02-09 MED ORDER — DILTIAZEM HCL ER 240 MG PO CP24
240.0000 mg | ORAL_CAPSULE | Freq: Every day | ORAL | Status: DC
  Filled 2015-02-09: qty 1

## 2015-02-09 MED ORDER — ATORVASTATIN CALCIUM 40 MG PO TABS
40.0000 mg | ORAL_TABLET | Freq: Every day | ORAL | Status: DC
  Administered 2015-02-10 – 2015-02-12 (×3): 40 mg via ORAL
  Filled 2015-02-09 (×6): qty 1

## 2015-02-09 MED ORDER — WARFARIN - PHARMACIST DOSING INPATIENT: Freq: Every day | Status: DC

## 2015-02-09 MED ORDER — WARFARIN - PHARMACIST DOSING INPATIENT
Freq: Every day | Status: DC
  Filled 2015-02-09 (×10): qty 1

## 2015-02-09 MED ORDER — METOPROLOL TARTRATE 100 MG PO TABS
100.0000 mg | ORAL_TABLET | Freq: Two times a day (BID) | ORAL | Status: DC
  Administered 2015-02-09 – 2015-02-12 (×6): 100 mg via ORAL
  Filled 2015-02-09: qty 1
  Filled 2015-02-09: qty 2
  Filled 2015-02-09 (×9): qty 1
  Filled 2015-02-09: qty 2

## 2015-02-09 MED ORDER — ATORVASTATIN CALCIUM 40 MG PO TABS
40.0000 mg | ORAL_TABLET | Freq: Every day | ORAL | Status: DC
  Administered 2015-02-09: 40 mg via ORAL
  Filled 2015-02-09 (×2): qty 1

## 2015-02-09 MED ORDER — WARFARIN SODIUM 5 MG PO TABS: 5.0000 mg | ORAL_TABLET | Freq: Every day | ORAL | Status: DC

## 2015-02-09 MED ORDER — ALBUTEROL SULFATE HFA 108 (90 BASE) MCG/ACT IN AERS: 2.0000 | INHALATION_SPRAY | Freq: Four times a day (QID) | RESPIRATORY_TRACT | Status: DC | PRN

## 2015-02-09 MED ORDER — DILTIAZEM HCL ER COATED BEADS 240 MG PO CP24
240.0000 mg | ORAL_CAPSULE | Freq: Once | ORAL | Status: AC
  Administered 2015-02-09: 240 mg via ORAL
  Filled 2015-02-09: qty 1

## 2015-02-09 MED ORDER — ACETAMINOPHEN 325 MG PO TABS
650.0000 mg | ORAL_TABLET | Freq: Once | ORAL | Status: AC
  Administered 2015-02-09: 650 mg via ORAL
  Filled 2015-02-09: qty 2

## 2015-02-09 MED ORDER — GABAPENTIN 100 MG PO CAPS
100.0000 mg | ORAL_CAPSULE | Freq: Every day | ORAL | Status: DC
  Administered 2015-02-09: 100 mg via ORAL
  Filled 2015-02-09: qty 1

## 2015-02-09 MED ORDER — GABAPENTIN 100 MG PO CAPS
100.0000 mg | ORAL_CAPSULE | Freq: Every day | ORAL | Status: DC
  Administered 2015-02-09 – 2015-02-11 (×3): 100 mg via ORAL
  Filled 2015-02-09 (×7): qty 1

## 2015-02-09 MED ORDER — METOPROLOL TARTRATE 25 MG PO TABS
100.0000 mg | ORAL_TABLET | Freq: Two times a day (BID) | ORAL | Status: DC
  Administered 2015-02-09: 100 mg via ORAL
  Filled 2015-02-09: qty 4

## 2015-02-09 NOTE — ED Notes (Signed)
Bed: [facility] Expected date:  Expected time:  Means of arrival:  Comments: 

## 2015-02-09 NOTE — ED Notes (Signed)
Psych MD and PA at bedside.

## 2015-02-09 NOTE — ED Notes (Signed)
Awake. Verbally responsive. Resp even and unlabored. No audible adventitious breath sounds. Pt ambulate with cane. Gait steady. ABC's intact. No behavior problems noted. NAD noted.

## 2015-02-09 NOTE — Progress Notes (Signed)
ANTICOAGULATION CONSULT NOTE - Initial Consult  Pharmacy Consult for Warfarin Indication: atrial fibrillation  Allergies  Allergen Reactions  . Aspirin Nausea And Vomiting    Patient Measurements:    Vital Signs: Temp: 98.7 F (37.1 C) (12/31 2219) Temp Source: Oral (12/31 2219) BP: 158/122 mmHg (12/31 2219) Pulse Rate: 98 (12/31 2219)  Labs:  Recent Labs  02/07/15 1257 02/07/15 1259 02/08/15 2250  HGB 14.8  --  14.1  HCT 44.4  --  41.4  PLT 475*  --  396  LABPROT  --  36.8* 33.7*  INR  --  3.83* 3.40*  CREATININE 1.11  --  0.98    CrCl cannot be calculated (Unknown ideal weight.).   Medical History: Past Medical History  Diagnosis Date  . Atrial fibrillation ([place])   . Hypertension   . Hyperlipidemia   . COPD (chronic obstructive pulmonary disease) ([place])   . Amaurosis fugax of left eye     history of   . Hx of echocardiogram     Echo (07/09/13):  Mild LVH, EF 55-60%, MAC, mild MR, severe LAE, mod RAE  . Renal insufficiency   . Stroke [facility])     Medications:  Scheduled:  . atorvastatin  40 mg Oral Daily  . diltiazem  240 mg Oral Once  . diltiazem  240 mg Oral Daily  . gabapentin  100 mg Oral QHS  . metoprolol  100 mg Oral BID  . warfarin  5 mg Oral q1800   Infusions:    Assessment:  65 yr male presents with depression and reports of not eating/drinking or taking meds.  C/O right leg pain, which has a surgical incision with staples.  PMH includes left arm vascular disease AFib, CVA.  Recent hospitalization where he underwent brachial artery bypass surgery.  Patient on warfarin 5mg  daily PTA for AFib.  Last reported dose was on 12/30.  INR 12/30 = 3.83  INR 12/31 = 3.4  Pharmacy consulted to manage warfarin therapy  Goal of Therapy:  INR 2-3   Plan:   No warfarin tonight due to elevated INR  Daily PT/INR - will resume when INR < 3  Caliya Narine, Toribio Harbour, PharmD 02/09/2015,1:15 AM

## 2015-02-09 NOTE — ED Notes (Signed)
Resting quietly with eye closed. Easily arousable. Verbally responsive. Resp even and unlabored. ABC's intact. No behavior problems noted. NAD noted.  

## 2015-02-09 NOTE — ED Notes (Signed)
Called and reported to Maudie Mercury, Therapist, sports at [facility] and she reported that the bed is not ready at this time and plans to call back when ready.
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
## 2015-02-09 NOTE — ED Notes (Signed)
Awake. Verbally responsive. A/O x4. Resp even and unlabored. No audible adventitious breath sounds noted. ABC's intact.  

## 2015-02-09 NOTE — ED Notes (Signed)
Security at bedside and wanded pt and personal belongings: hat, jacket, sweatpants, shirt, socks, shoes, cane, cell phone and charger.

## 2015-02-09 NOTE — Progress Notes (Signed)
Patient accepted to [facility] room 406-2 once medically cleared. Clayborne Dana, RN

## 2015-02-09 NOTE — ED Notes (Signed)
PT have lunch tray

## 2015-02-09 NOTE — ED Notes (Signed)
Pt given breakfast tray and encourage to eat meal.

## 2015-02-09 NOTE — ED Notes (Signed)
Awake. Verbally responsive. A/O x4. Resp even and unlabored. No audible adventitious breath sounds noted. ABC's intact. IV saline lock patent and intact. NAD noted.
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
## 2015-02-09 NOTE — Progress Notes (Signed)
D Pt. Denies SI , no complaints of pain or discomfort noted at present time.  Writer had to call pt.'s name 3 times to waken him for the assessment.      A Writer offered support and encouragement, discussed pt.'s overall day with him.  R Pt. Rates his day a 5, his anxiety a 5 and his depression an 8.  When writer ask pt. Is he was experiencing HI, his response was "yes but they are not here".  Pt. Has not been out of bed this pm.  Pt. Remains safe on the unit.

## 2015-02-09 NOTE — ED Notes (Signed)
[facility] called and arranged for transport to [facility].

## 2015-02-09 NOTE — ED Provider Notes (Signed)
Filed Vitals:   02/09/15 0517 02/09/15 0655  BP: 125/96 130/100  Pulse: 104 95  Temp:    Resp: 16 15   Patient presented yesterday with homicidal ideations. He was accepted to behavioral health pending medical clearance. He did have a recent surgery involving a vein removal in his right lower leg that was grafted in his left arm due to an arterial occlusion. He's on chronic Coumadin. His INR is just over 3. He has some mild erythema around the leg incision. Staples are intact. There is no warmth. No drainage. Mild tenderness over the area. He states he had a walk long distance which he feels is why the redness develop. At this point it looks more like irritation rather than infection. He's afebrile. His TSH is slightly low. He denies any past history of thyroid issues. His vital signs are stable. He has mild intermittent tachycardia but nothing that's persistent. I encouraged him to follow-up with his PCP to have his thyroid rechecked. I also encouraged him to follow-up with his vascular surgeon as directed. At this point he is medically cleared to go to behavioral health. His leg wound will have to be monitored for any worsening.  [name] Johns, MD 02/09/15 1048

## 2015-02-09 NOTE — ED Notes (Signed)
Kim, RN from [facility] called and reported that pt is ready for discharge to facility RM 406-2.

## 2015-02-09 NOTE — Progress Notes (Signed)
ANTICOAGULATION CONSULT NOTE - Follow Up  Pharmacy Consult for Warfarin Indication: atrial fibrillation  Allergies  Allergen Reactions  . Aspirin Nausea And Vomiting    Patient Measurements:    Vital Signs: BP: 130/100 mmHg (01/01 0655) Pulse Rate: 95 (01/01 0655)  Labs:  Recent Labs  02/07/15 1257 02/07/15 1259 02/08/15 2250 02/09/15 0500  HGB 14.8  --  14.1  --   HCT 44.4  --  41.4  --   PLT 475*  --  396  --   LABPROT  --  36.8* 33.7* 34.3*  INR  --  3.83* 3.40* 3.49*  CREATININE 1.11  --  0.98  --     CrCl cannot be calculated (Unknown ideal weight.).   Medical History: Past Medical History  Diagnosis Date  . Atrial fibrillation ([place])   . Hypertension   . Hyperlipidemia   . COPD (chronic obstructive pulmonary disease) ([place])   . Amaurosis fugax of left eye     history of   . Hx of echocardiogram     Echo (07/09/13):  Mild LVH, EF 55-60%, MAC, mild MR, severe LAE, mod RAE  . Renal insufficiency   . Stroke [facility])     Medications:  Scheduled:  . atorvastatin  40 mg Oral Daily  . diltiazem  240 mg Oral Daily  . gabapentin  100 mg Oral QHS  . metoprolol  100 mg Oral BID  . Warfarin - Pharmacist Dosing Inpatient   Does not apply q1800   Infusions:    Assessment:  65 yr male presents with depression and reports of not eating/drinking or taking meds.  C/O right leg pain, which has a surgical incision with staples.  PMH includes left arm vascular disease AFib, CVA.  Recent hospitalization where he underwent brachial artery bypass surgery.  Patient on warfarin 5mg  daily PTA for AFib.  Last reported dose was on 12/30.  Pharmacy consulted to manage warfarin therapy  Today, 02/09/2015: - INR remains supratherapeutic at 3.49 - Hgb/plts WNL  Goal of Therapy:  INR 2-3   Plan:   No warfarin today  Daily PT/INR - will resume when INR < 3  Hershal Coria, PharmD 02/09/2015,10:52 AM
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
## 2015-02-09 NOTE — ED Notes (Signed)
Pt sisterAlda [phone]

## 2015-02-10 ENCOUNTER — Encounter (HOSPITAL_COMMUNITY): Payer: Self-pay | Admitting: Psychiatry

## 2015-02-10 DIAGNOSIS — F322 Major depressive disorder, single episode, severe without psychotic features: Secondary | ICD-10-CM | POA: Clinically undetermined

## 2015-02-10 DIAGNOSIS — R45851 Suicidal ideations: Secondary | ICD-10-CM

## 2015-02-10 DIAGNOSIS — R45850 Homicidal ideations: Secondary | ICD-10-CM

## 2015-02-10 DIAGNOSIS — F332 Major depressive disorder, recurrent severe without psychotic features: Secondary | ICD-10-CM | POA: Diagnosis present

## 2015-02-10 LAB — PROTIME-INR
INR: 2.87 — ABNORMAL HIGH (ref 0.00–1.49)
PROTHROMBIN TIME: 29.6 s — AB (ref 11.6–15.2)

## 2015-02-10 MED ORDER — WARFARIN SODIUM 2.5 MG PO TABS
2.5000 mg | ORAL_TABLET | Freq: Once | ORAL | Status: AC
  Administered 2015-02-10: 2.5 mg via ORAL
  Filled 2015-02-10: qty 1

## 2015-02-10 MED ORDER — ESCITALOPRAM OXALATE 10 MG PO TABS
ORAL_TABLET | ORAL | Status: AC
  Filled 2015-02-10: qty 1

## 2015-02-10 MED ORDER — WARFARIN SODIUM 5 MG PO TABS
5.0000 mg | ORAL_TABLET | Freq: Once | ORAL | Status: DC
  Filled 2015-02-10: qty 1

## 2015-02-10 MED ORDER — ACETAMINOPHEN 325 MG PO TABS
650.0000 mg | ORAL_TABLET | Freq: Four times a day (QID) | ORAL | Status: DC | PRN
  Administered 2015-02-10 – 2015-02-11 (×2): 650 mg via ORAL
  Filled 2015-02-10 (×2): qty 2

## 2015-02-10 MED ORDER — HYDROXYZINE HCL 25 MG PO TABS
25.0000 mg | ORAL_TABLET | Freq: Four times a day (QID) | ORAL | Status: DC | PRN
Start: 2015-02-10 — End: 2015-02-12
  Administered 2015-02-11: 25 mg via ORAL
  Filled 2015-02-10: qty 1

## 2015-02-10 MED ORDER — ESCITALOPRAM OXALATE 5 MG PO TABS
5.0000 mg | ORAL_TABLET | Freq: Every day | ORAL | Status: DC
  Administered 2015-02-10 – 2015-02-12 (×3): 5 mg via ORAL
  Filled 2015-02-10 (×5): qty 1

## 2015-02-10 NOTE — BHH Group Notes (Signed)
[place] LCSW Group Therapy  02/10/2015 1:15pm  Type of Therapy:  Group Therapy vercoming Obstacles  Pt did not attend, declined invitation.   Peri Maris, [place] 02/10/2015 3:07 PM

## 2015-02-10 NOTE — Progress Notes (Signed)
Adult Psychoeducational Group Note  Date:  02/10/2015 Time:  8:20 PM  Group Topic/Focus:  Wrap-Up Group:   The focus of this group is to help patients review their daily goal of treatment and discuss progress on daily workbooks.  Participation Level:  Did Not Attend  Pt was asleep during wrap-up group.   Lincoln Brigham 02/10/2015, 9:09 PM

## 2015-02-10 NOTE — Tx Team (Signed)
Interdisciplinary Treatment Plan Update (Adult) Date: 02/10/2015   Date: 02/10/2015 10:18 AM  Progress in Treatment:  Attending groups: Pt is new to milieu, continuing to assess  Participating in groups: Pt is new to milieu, continuing to assess  Taking medication as prescribed: Yes  Tolerating medication: Yes  Family/Significant othe contact made: No, CSW assessing for appropriate contact Patient understands diagnosis: Continuing to assess Discussing patient identified problems/goals with staff: Yes  Medical problems stabilized or resolved: Yes  Denies suicidal/homicidal ideation: No, Pt recently admitted with SI Patient has not harmed self or Others: Yes   New problem(s) identified: None identified at this time.   Discharge Plan or Barriers: CSW will assess for appropriate discharge plan and relevant barriers.   Additional comments:  Patient and CSW reviewed pt's identified goals and treatment plan. Patient verbalized understanding and agreed to treatment plan. CSW reviewed [facility] "Discharge Process and Patient Involvement" Form. Pt verbalized understanding of information provided and signed form.   Reason for Continuation of Hospitalization:  Depression Medication stabilization Suicidal ideation  Estimated length of stay: 3-5 days  Review of initial/current patient goals per problem list:   1.  Goal(s): Patient will participate in aftercare plan  Met:  No  Target date: 3-5 days from date of admission   As evidenced by: Patient will participate within aftercare plan AEB aftercare provider and housing plan at discharge being identified.  02/10/15: CSW to work with Pt to assess for appropriate discharge plan and faciliate appointments and referrals as needed prior to d/c.   2.  Goal (s): Patient will exhibit decreased depressive symptoms and suicidal ideations.  Met:  No  Target date: 3-5 days from date of admission   As evidenced by: Patient will utilize self rating of  depression at 3 or below and demonstrate decreased signs of depression or be deemed stable for discharge by MD. 02/10/15: Pt was admitted with symptoms of depression, rating 10/10. Pt continues to present with flat affect and depressive symptoms.  Pt will demonstrate decreased symptoms of depression and rate depression at 3/10 or lower prior to discharge.  Attendees:  Patient:    Family:    Physician: Dr. Shea Evans, MD  02/10/2015 10:18 AM  Nursing: Lars Pinks, RN Case manager  02/10/2015 10:18 AM  Clinical Social Worker Peri Maris, [place] 02/10/2015 10:18 AM  Other: Tilden Fossa, LCSWA 02/10/2015 10:18 AM  Clinical: Loletta Specter RN 02/10/2015 10:18 AM  Other: , RN Charge Nurse 02/10/2015 10:18 AM  Other: Hilda Lias, [place], [place] Social Work [phone]
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
## 2015-02-10 NOTE — Progress Notes (Signed)
Recreation Therapy Notes  Date: 01.02.2017 Time: 9:30am Location: 300 Group Room   Group Topic: Stress Management  Goal Area(s) Addresses:  Patient will actively participate in stress management techniques presented during session.   Behavioral Response: Did not attend.   Laureen Ochs Jaysie Benthall, LRT/CTRS        Sanika Brosious L 02/10/2015 10:22 AM

## 2015-02-10 NOTE — Progress Notes (Addendum)
ANTICOAGULATION CONSULT NOTE - Initial Consult  Pharmacy Consult for Coumadin Indication: DVT, Atrial fib   Allergies  Allergen Reactions  . Aspirin Nausea And Vomiting    Patient Measurements:   Heparin Dosing Weight:   Vital Signs: Temp: 98 F (36.7 C) (01/01 2115) Temp Source: Oral (01/01 2115) BP: 126/90 mmHg (01/01 2115) Pulse Rate: 72 (01/01 2115)  Labs:  Recent Labs  02/07/15 1257  02/08/15 2250 02/09/15 0500 02/10/15 0653  HGB 14.8  --  14.1  --   --   HCT 44.4  --  41.4  --   --   PLT 475*  --  396  --   --   LABPROT  --   < > 33.7* 34.3* 29.6*  INR  --   < > 3.40* 3.49* 2.87*  CREATININE 1.11  --  0.98  --   --   < > = values in this interval not displayed.  CrCl cannot be calculated (Unknown ideal weight.).   Medical History: Past Medical History  Diagnosis Date  . Atrial fibrillation ([place])   . Hypertension   . Hyperlipidemia   . COPD (chronic obstructive pulmonary disease) ([place])   . Amaurosis fugax of left eye     history of   . Hx of echocardiogram     Echo (07/09/13):  Mild LVH, EF 55-60%, MAC, mild MR, severe LAE, mod RAE  . Renal insufficiency   . Stroke [facility])     Medications:  Scheduled:  . atorvastatin  40 mg Oral Daily  . diltiazem  240 mg Oral Daily  . gabapentin  100 mg Oral QHS  . metoprolol  100 mg Oral BID  . Warfarin - Pharmacist Dosing Inpatient   Does not apply q1800   PRN: albuterol  Assessment: 65 yo with hx of left arm ischemia Had occlusion of brachial artery; underwent saphenous vein bypass from left axillary artery to brachial artery with vein harvested from his left leg. Re-presented in Oct 2016 for occlusion of graft, had thrombectomy of graft, and had thrombectomy of radial and ulnar arteries. Goal of Therapy:  INR 2-3    Plan:  INR=2.87 today, within goal range. Will resume Coumadin 5mg  X1 today at 1800.  Gypsy Decant 02/10/2015,8:34 AM  Will restart Coumadin with 2.5mg  instead.  Checked with  patient's nurse and he is not eating well currently.

## 2015-02-10 NOTE — Progress Notes (Signed)
Patient has remained in bed throughout the day. Refusing to go for meals, refusing snacks. Will drink with prompting and is med compliant. Disheveled with noted body odor. Affect flat, depressed with congruent mood. States his depression, hopelessness and anxiety are all at 10/10. Patient given support, reassurance. Staff assisted patient with completion of self inventory. High fall risk precautions reviewed and in place. Patient encouraged to get up and out of bed however continues to refuse. Will continue to prompt for meals, fluids and ambulation. Discussed warfarin dosing with pharmacist. Will continue to monitor closely. Denies SI/HI at this time. Jamie Kato

## 2015-02-10 NOTE — Progress Notes (Signed)
Patient ID: [name], male   DOB: 25-Aug-1950, 65 y.o.   MRN: [id number] Per State regulations 482.30 this chart was reviewed for medical necessity with respect to the patient's admission/duration of stay.    Next review date: 02/13/15  Debarah Crape, BSN, RN-BC  Case Manager

## 2015-02-10 NOTE — [facility] Group Notes (Signed)
[facility] LCSW Aftercare Discharge Planning Group Note  02/10/2015 8:45 AM  Pt did not attend, declined invitation.   Peri Maris, LCSWA 02/10/2015 10:16 AM
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
## 2015-02-11 DIAGNOSIS — I998 Other disorder of circulatory system: Secondary | ICD-10-CM

## 2015-02-11 DIAGNOSIS — L03115 Cellulitis of right lower limb: Secondary | ICD-10-CM

## 2015-02-11 DIAGNOSIS — I10 Essential (primary) hypertension: Secondary | ICD-10-CM

## 2015-02-11 LAB — T4, FREE: FREE T4: 1 ng/dL (ref 0.61–1.12)

## 2015-02-11 LAB — GLUCOSE, CAPILLARY: GLUCOSE-CAPILLARY: 93 mg/dL (ref 65–99)

## 2015-02-11 LAB — PROTIME-INR
INR: 2.77 — ABNORMAL HIGH (ref 0.00–1.49)
Prothrombin Time: 28.8 seconds — ABNORMAL HIGH (ref 11.6–15.2)

## 2015-02-11 MED ORDER — WARFARIN SODIUM 2.5 MG PO TABS
2.5000 mg | ORAL_TABLET | Freq: Once | ORAL | Status: AC
  Administered 2015-02-11: 2.5 mg via ORAL
  Filled 2015-02-11: qty 1

## 2015-02-11 MED ORDER — DOXYCYCLINE HYCLATE 100 MG PO TABS
100.0000 mg | ORAL_TABLET | Freq: Two times a day (BID) | ORAL | Status: DC
  Administered 2015-02-11 – 2015-02-12 (×2): 100 mg via ORAL
  Filled 2015-02-11 (×7): qty 1

## 2015-02-11 MED ORDER — TRAZODONE HCL 50 MG PO TABS
50.0000 mg | ORAL_TABLET | Freq: Every evening | ORAL | Status: DC | PRN
  Administered 2015-02-11: 50 mg via ORAL
  Filled 2015-02-11 (×6): qty 1

## 2015-02-11 MED ORDER — OXYCODONE-ACETAMINOPHEN 5-325 MG PO TABS
1.0000 | ORAL_TABLET | Freq: Once | ORAL | Status: AC
  Administered 2015-02-11: 1 via ORAL
  Filled 2015-02-11: qty 1

## 2015-02-11 MED ORDER — OXYCODONE-ACETAMINOPHEN 5-325 MG PO TABS
1.0000 | ORAL_TABLET | Freq: Three times a day (TID) | ORAL | Status: DC | PRN
  Administered 2015-02-11 – 2015-02-12 (×3): 1 via ORAL
  Filled 2015-02-11 (×3): qty 1
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
## 2015-02-11 NOTE — Progress Notes (Signed)
ANTICOAGULATION CONSULT NOTE - Follow Up Consult  Pharmacy Consult for Coumadin  Indication: DVT, A fib  Allergies  Allergen Reactions  . Aspirin Nausea And Vomiting     Labs:  Recent Labs  02/08/15 2250 02/09/15 0500 02/10/15 0653 02/11/15 0615  HGB 14.1  --   --   --   HCT 41.4  --   --   --   PLT 396  --   --   --   LABPROT 33.7* 34.3* 29.6* 28.8*  INR 3.40* 3.49* 2.87* 2.77*  CREATININE 0.98  --   --   --     CrCl cannot be calculated (Unknown ideal weight.).   Medications:  Scheduled:  . atorvastatin  40 mg Oral Daily  . diltiazem  240 mg Oral Daily  . escitalopram  5 mg Oral Daily  . gabapentin  100 mg Oral QHS  . metoprolol  100 mg Oral BID  . warfarin  2.5 mg Oral ONCE-1800  . Warfarin - Pharmacist Dosing Inpatient   Does not apply q1800    Assessment: No problems noted with therapy.  INR down into therapuetic range, currently 2.77. Home dose recorded as 5 mg daily.  INR 12/31 was 3.4.    Goal of Therapy:  INR 2-3    Plan:  Coumadin 2.5 mg x 1 today PT/INR in am   Lenox Ponds 02/11/2015,8:58 AM

## 2015-02-11 NOTE — Progress Notes (Signed)
Recreation Therapy Notes  Animal-Assisted Activity (AAA) Program Checklist/Progress Notes Patient Eligibility Criteria Checklist & Daily Group note for Rec Tx Intervention  Date: 01.03.2017 Time: 2:45pm Location: [address]    AAA/T Program Assumption of Risk Form signed by Patient/ or Parent Legal Guardian yes  Patient is free of allergies or sever asthma yes  Patient reports no fear of animals yes  Patient reports no history of cruelty to animals yes  Patient understands his/her participation is voluntary yes  Patient washes hands before animal contact yes  Patient washes hands after animal contact yes  Behavioral Response: Appropriate   Education: Hand Washing, Appropriate Animal Interaction   Education Outcome: Acknowledges education.   Clinical Observations/Feedback: Patient engaged appropriately with therapy dog and peers during session. Additionally patient asked appropriate questions about therapy dog and his training.   Laureen Ochs Bryona Foxworthy, LRT/CTRS        [place] Antos L 02/11/2015 2:05 PM

## 2015-02-11 NOTE — Consult Note (Signed)
Requesting physician: Neita Garnet  Reason for consultation: Medical management of uncontrolled hypertension, suppressed TSH and concern for cellulitis over the right leg surgical site   History of Present Illness: 65 year old male with history of A. fib on Coumadin, hypertension, hyperlipidemia, COPD with ongoing tobacco use, history of stroke, recent left arm ischemia for which she was hospitalized and underwent left brachial to ulnar artery bypass using reversed right greater saphenous vein by vascular surgery on 01/27/2015. Patient was admitted to [facility] on 1/1 with severe depression. He denied any suicidal or homicidal ideations. This morning patient was found to have elevated blood pressure of 147/105 mmHg. Blood will done showed suppressed TSH but normal free T4 and T3. Patient reported some pain over right saphenous vein graft with staples and some redness. Hospitalist consult requested for management of the issues. Patient denies any fever but reports some chills, denies headache, blurred vision, dizziness, nausea, vomiting, chest pain, shortness of breath, abdominal pain, tingling or numbness in his extremities, weakness, bowel or urinary symptoms. Reports being compliant with his medications.     Allergies:    Allergies  Allergen Reactions  . Aspirin Nausea And Vomiting      Past Medical History  Diagnosis Date  . Atrial fibrillation ([place])   . Hypertension   . Hyperlipidemia   . COPD (chronic obstructive pulmonary disease) ([place])   . Amaurosis fugax of left eye     history of   . Hx of echocardiogram     Echo (07/09/13):  Mild LVH, EF 55-60%, MAC, mild MR, severe LAE, mod RAE  . Renal insufficiency   . Stroke [facility])     Past Surgical History  Procedure Laterality Date  . Embolectomy Left 04/05/2014    Procedure: THROMBO ENDARTERECTOMY OF LEFT BRACHIAL, RADIAL AND ULNAR ARTERY,  Left Radial artery cut down and radial artery thrombectomy.;  Surgeon:  Elam Dutch, MD;  Location: [facility] OR;  Service: Vascular;  Laterality: Left;  . Patch angioplasty Left 04/05/2014    Procedure: LEFT ARM VEIN PATCH ANGIOPLASTY;  Surgeon: Elam Dutch, MD;  Location: [place];  Service: Vascular;  Laterality: Left;  . Arch aortogram N/A 04/05/2014    Procedure: ARCH AORTOGRAM, FIRST ORDER CATHETERIZATION LEFT SUBCLAVIAN ARTERY;  Surgeon: Elam Dutch, MD;  Location: [place];  Service: Vascular;  Laterality: N/A;  . Axillary-femoral bypass graft Left 04/08/2014    Procedure: LEFT AXILLARY ARTERY TO BRACHIAL ARTERY BYPASS USING NON REVERSE LEFT GREATER SAPHENOUS VEIN ,LIGATION OF LEFT AXILLARY ARTERY ANEURYSM;  Surgeon: Elam Dutch, MD;  Location: [place];  Service: Vascular;  Laterality: Left;  . Vein harvest Left 04/08/2014    Procedure: LEFT GREATER SAPHENOUS VEIN HARVEST;  Surgeon: Elam Dutch, MD;  Location: [place];  Service: Vascular;  Laterality: Left;  . Thrombectomy brachial artery Left 11/20/2014    Procedure: 1.  Thrombectomy Left Axilo-Brachial Bypass  2.  Thromboendarterecotmy of Left Brachial Artery with Fogarty Thrombectomy of Radial and Ulnar Arteries with Dacron patch angioplasty Left Brachial Artery. 3. Intraoperative  Arteriogram times four.;  Surgeon: Mal Misty, MD;  Location: [place];  Service: Vascular;  Laterality: Left;  . Peripheral vascular catheterization N/A 01/22/2015    Procedure: Aortic Arch Angiography;  Surgeon: Elam Dutch, MD;  Location: [facility];  Service: Cardiovascular;  Laterality: N/A;  . Bypass axilla/brachial artery Left 01/27/2015    Procedure: LEFT BRACHIAL-ULAR ARTERY BYPASS USING GREATER SAPHENOUS VEIN;  Surgeon: Elam Dutch, MD;  Location: [facility];  Service: Vascular;  Laterality: Left;  . Vein harvest Right 01/27/2015    Procedure: RIGHT GREATER SAPHENOUS VEIN HARVEST;  Surgeon: Elam Dutch, MD;  Location: [facility] OR;  Service: Vascular;  Laterality: Right;    Medications:  Scheduled  Meds: . atorvastatin  40 mg Oral Daily  . diltiazem  240 mg Oral Daily  . escitalopram  5 mg Oral Daily  . gabapentin  100 mg Oral QHS  . metoprolol  100 mg Oral BID  . warfarin  2.5 mg Oral ONCE-1800  . Warfarin - Pharmacist Dosing Inpatient   Does not apply q1800   Continuous Infusions:  PRN Meds:.acetaminophen, albuterol, hydrOXYzine, oxyCODONE-acetaminophen  Social History:  reports that he quit smoking about 2 months ago. His smoking use included Cigarettes. He has a 90 pack-year smoking history. He has never used smokeless tobacco. He reports that he does not drink alcohol or use illicit drugs.  Family History  Problem Relation Age of Onset  . Hypertension Mother   . Cancer Mother   . Cancer Father     gastric cancer  . Cancer Sister   . Early death Neg Hx   . Heart disease Neg Hx   . Hyperlipidemia Neg Hx   . Kidney disease Neg Hx   . Stroke Neg Hx     Review of Systems:  Constitutional: Denies fever, chills, diaphoresis, appetite change and fatigue.  HEENT: Denies visual or hearing symptoms, congestion, difficulty swallowing, neck stiffness Chest: Denies cough, shortness of breath or wheezing CVS: Denies chest pain, palpitations or leg swellings GI: Denies nausea, vomiting,, abdominal pain, diarrhea, constipation, blood in stool and abdominal distention.  Genitourinary: Denies dysuria, urgency, frequency, hematuria, flank pain and difficulty urinating.  Endocrine: Denies: hot or cold intolerance,, polyuria, polydipsia. Musculoskeletal: Denies myalgias, back pain, joint swelling, arthralgias and gait problem.  Skin: Denies pallor, redness and some pain over rt thigh ( surgical site) Neurological: Denies dizziness, seizures, syncope, weakness, light-headedness, numbness and headaches.  Hematological: Denies adenopathy.  Psychiatric/Behavioral: Denies suicidal ideation, mood changes, confusion, nervousness, sleep disturbance and agitation   Physical Exam:  Filed  Vitals:   02/10/15 0630 02/10/15 0631 02/10/15 1706 02/10/15 2036  BP: 152/110 132/97 110/87 147/105  Pulse: 78 96 79 78  Temp: 98 F (36.7 C)     TempSrc: Oral     Resp: 20  16 16   SpO2:        No intake or output data in the 24 hours ending 02/11/15 1635  General:  elderly thin built male not in distress  HEENT moist mucosa, supple neck  Heart:  S1 and S2 irregular, no murmurs or gallop  Lungs: Clear to auscultation bilaterally. Abdomen: Soft, nontender, nondistended,  Extremities: Multiple staples over right inner thigh with mild erythema around the lower staples. Has some tenderness to pressure. No discharge or bleeding CNS: Alert and oriented   Labs on Admission:  CBC:    Component Value Date/Time   WBC 10.6* 02/08/2015 2250   HGB 14.1 02/08/2015 2250   HCT 41.4 02/08/2015 2250   PLT 396 02/08/2015 2250   MCV 90.0 02/08/2015 2250   NEUTROABS 7.7 02/08/2015 2250   LYMPHSABS 2.4 02/08/2015 2250   MONOABS 0.5 02/08/2015 2250   EOSABS 0.0 02/08/2015 2250   BASOSABS 0.0 02/08/2015 123XX123    Basic Metabolic Panel:    Component Value Date/Time   NA 142 02/08/2015 2250   K 3.3* 02/08/2015 2250   CL 104 02/08/2015 2250  CO2 29 02/08/2015 2250   BUN 12 02/08/2015 2250   CREATININE 0.98 02/08/2015 2250   CREATININE 1.23 08/07/2014 0913   GLUCOSE 109* 02/08/2015 2250   CALCIUM 9.6 02/08/2015 2250    Radiological Exams on Admission: No results found.  Assessment/Plan   principle problems Uncontrolled hypertension I see only one reading of high blood pressure this morning. He is on metoprolol and Cardizem already. This patient has persistently elevated blood pressure on subsequent reading I will not add on for the blood pressure medications.   ? Early Cellulitis of right thigh (surgical site) Patient does have some erythema around the staples in the lower  thigh . no warmth but does have some tenderness. I would place him on some doxycycline empirically. Check  CBC.  Suppressed TSH (0.28) Normal free T3 and T4. Besides underlying depression is does not have any hypothyroid symptoms. I would hold off on any treatment at this time and should have his thyroid function and evaluated in 4-6 weeks as outpatient.  A. fib Rate controlled on beta blocker and Cardizem. Continue Coumadin. INR therapeutic.  Severe depression Treatment per primary. Denies suicidal ideation.  Left arm ischemia status post left brachial to ulnar artery bypass Follow-up with vascular surgery as scheduled.  Tobacco abuse Counseled on cessation.  Hyperlipidemia Continue statin.  Will follow again tomorrow.  Time Spent on Admission 50 minutes.  Malikhi Ogan 02/11/2015, 4:35 PM

## 2015-02-11 NOTE — Progress Notes (Signed)
Adult Psychoeducational Group Note  Date:  02/11/2015 Time:  9:56 PM  Group Topic/Focus:  Wrap-Up Group:   The focus of this group is to help patients review their daily goal of treatment and discuss progress on daily workbooks.  Participation Level:  Did Not Attend  Participation Quality:  Did not attend  Affect:  Did not attend  Cognitive:  Did not attend  Insight: None  Engagement in Group:  Did not attend  Modes of Intervention:  Did not attend  Additional Comments:  Patient did not attend wrap up group tonight.  Chloe Baig L Jency Schnieders 02/11/2015, 9:56 PM

## 2015-02-11 NOTE — BHH Suicide Risk Assessment (Signed)
[place] INPATIENT:  Family/Significant Other Suicide Prevention Education  Suicide Prevention Education:  Education Completed; Alda Berthold, Pt's sister [phone],  has been identified by the patient as the family member/significant other with whom the patient will be residing, and identified as the person(s) who will aid the patient in the event of a mental health crisis (suicidal ideations/suicide attempt).  With written consent from the patient, the family member/significant other has been provided the following suicide prevention education, prior to the and/or following the discharge of the patient.  The suicide prevention education provided includes the following:  Suicide risk factors  Suicide prevention and interventions  National Suicide Hotline telephone number  [facility] assessment telephone number  [facility] Emergency Assistance [place] and/or Residential Mobile Crisis Unit telephone number  Request made of family/significant other to:  Remove weapons (e.g., guns, rifles, knives), all items previously/currently identified as safety concern.    Remove drugs/medications (over-the-counter, prescriptions, illicit drugs), all items previously/currently identified as a safety concern.  The family member/significant other verbalizes understanding of the suicide prevention education information provided.  The family member/significant other agrees to remove the items of safety concern listed above.  [name] 02/11/2015, 4:04 PM

## 2015-02-11 NOTE — BHH Group Notes (Signed)
[place] LCSW Group Therapy 02/11/2015 1:15 PM  Type of Therapy: Group Therapy- Feelings about Diagnosis  Pt did not attend, declined invitation.   Peri Maris, [place] 02/11/2015 2:39 PM
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
## 2015-02-11 NOTE — BHH Group Notes (Signed)
[place] Group Notes:  (Nursing/MHT/Case Management/Adjunct)  Date:  02/11/2015  Time:  10:54 AM  Type of Therapy:  Psychoeducational Skills  Participation Level:  Did Not Attend  Participation Quality:  N/A  Affect:  N/A  Cognitive:  N/A  Insight:  None  Engagement in Group:  N/A  Modes of Intervention:  N/A  Summary of Progress/Problems:  Pt. Did not attend  Oretha Milch 02/11/2015, 10:54 AM
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
## 2015-02-12 DIAGNOSIS — F322 Major depressive disorder, single episode, severe without psychotic features: Secondary | ICD-10-CM

## 2015-02-12 LAB — PROTIME-INR
INR: 2.93 — AB (ref 0.00–1.49)
PROTHROMBIN TIME: 30 s — AB (ref 11.6–15.2)

## 2015-02-12 LAB — T3, FREE: T3, Free: 3 pg/mL (ref 2.0–4.4)

## 2015-02-12 MED ORDER — GABAPENTIN 100 MG PO CAPS: 100.0000 mg | ORAL_CAPSULE | Freq: Every day | ORAL | Status: DC

## 2015-02-12 MED ORDER — DILTIAZEM HCL ER 240 MG PO CP24: 240.0000 mg | ORAL_CAPSULE | Freq: Every day | ORAL | Status: DC

## 2015-02-12 MED ORDER — WARFARIN SODIUM 5 MG PO TABS: 5.0000 mg | ORAL_TABLET | Freq: Every day | ORAL | Status: DC

## 2015-02-12 MED ORDER — ESCITALOPRAM OXALATE 5 MG PO TABS: 5.0000 mg | ORAL_TABLET | Freq: Every day | ORAL | Status: DC

## 2015-02-12 MED ORDER — ALBUTEROL SULFATE HFA 108 (90 BASE) MCG/ACT IN AERS: 2.0000 | INHALATION_SPRAY | Freq: Four times a day (QID) | RESPIRATORY_TRACT | Status: DC | PRN

## 2015-02-12 MED ORDER — ATORVASTATIN CALCIUM 40 MG PO TABS: 40.0000 mg | ORAL_TABLET | Freq: Every day | ORAL | Status: DC

## 2015-02-12 MED ORDER — DOXYCYCLINE HYCLATE 100 MG PO TABS: 100.0000 mg | ORAL_TABLET | Freq: Two times a day (BID) | ORAL | Status: DC

## 2015-02-12 MED ORDER — TRAZODONE HCL 50 MG PO TABS: 50.0000 mg | ORAL_TABLET | Freq: Every evening | ORAL | Status: DC | PRN

## 2015-02-12 MED ORDER — ACETAMINOPHEN 500 MG PO TABS: 1000.0000 mg | ORAL_TABLET | Freq: Two times a day (BID) | ORAL | Status: DC

## 2015-02-12 MED ORDER — OXYCODONE-ACETAMINOPHEN 5-325 MG PO TABS: 1.0000 | ORAL_TABLET | ORAL | Status: DC | PRN

## 2015-02-12 MED ORDER — WARFARIN SODIUM 2 MG PO TABS
2.0000 mg | ORAL_TABLET | Freq: Once | ORAL | Status: DC
  Filled 2015-02-12: qty 1

## 2015-02-12 MED ORDER — HYDROXYZINE HCL 25 MG PO TABS: 25.0000 mg | ORAL_TABLET | Freq: Four times a day (QID) | ORAL | Status: DC | PRN

## 2015-02-12 MED ORDER — METOPROLOL TARTRATE 100 MG PO TABS: 100.0000 mg | ORAL_TABLET | Freq: Two times a day (BID) | ORAL | Status: DC

## 2015-02-12 NOTE — Progress Notes (Signed)
   D: Pt was more irritable today than previous day. Stated he was in more pain. Pt explained that his doctor was upset that he wasn't getting his oxycodone. Stated, "I know what's wrong with me". "I know what I need to do". Pt stated that he and his mother have a hard time dealing with stress. Stated, "I got to learn how to deal with it", referring to stress. Pt has no other questions or concerns.   A:  Support and encouragement was offered. 15 min checks continued for safety.  R: Pt remains safe.

## 2015-02-12 NOTE — Progress Notes (Signed)
Continue doxycycline for total of 7 days, no changed to blood pressure medications, continue with Diltiazem and Cardizem.  Will sign off, please let us know if we can be of further help.   Faye Ramsay, MD  [facility] Pager [phone]  If 7PM-7AM, please contact night-coverage [email or web address] Password [email or web address]

## 2015-02-12 NOTE — Progress Notes (Signed)
  [facility] Adult Case Management Discharge Plan :  Will you be returning to the same living situation after discharge:  Yes,  Pt returning home At discharge, do you have transportation home?: Yes,  Pt sister to pick up Do you have the ability to pay for your medications: Yes,  Pt given prescriptions  Release of information consent forms completed and in the chart;  Patient's signature needed at discharge.  Patient to Follow up at: Follow-up Information    Follow up with [facility] On 02/13/2015.   Why:  at 4:00pm for your hospital discharge appointment.   Contact information:   [place] [phone]       Next level of care provider has access to [place] and Suicide Prevention discussed: Yes,  with sister; see SPE note for further details  Have you used any form of tobacco in the last 30 days? (Cigarettes, Smokeless Tobacco, Cigars, and/or Pipes): Yes  Has patient been referred to the Quitline?: Patient refused referral  Patient has been referred for addiction treatment: N/A    [name] 02/12/2015, 3:59 PM

## 2015-02-12 NOTE — Tx Team (Addendum)
Interdisciplinary Treatment Plan Update (Adult) Date: 02/12/2015   Date: 02/12/2015 3:50 PM  Progress in Treatment:  Attending groups: No  Participating in groups: No Taking medication as prescribed: Yes  Tolerating medication: Yes  Family/Significant othe contact made: Yes, with sister Patient understands diagnosis:Yes Discussing patient identified problems/goals with staff: Yes  Medical problems stabilized or resolved: Yes  Denies suicidal/homicidal ideation: Yes Patient has not harmed self or Others: Yes   New problem(s) identified: None identified at this time.   Discharge Plan or Barriers: Pt will return home and follow-up with his PCP; Pt requests to continue seeing his PCP for medication management. He is not interested in a therapy referral at this time.   Additional comments:  Patient and CSW reviewed pt's identified goals and treatment plan. Patient verbalized understanding and agreed to treatment plan. CSW reviewed [facility] "Discharge Process and Patient Involvement" Form. Pt verbalized understanding of information provided and signed form.   Reason for Continuation of Hospitalization:  Depression Medication stabilization Suicidal ideation  Estimated length of stay: 0 days; Pt stable for DC  Review of initial/current patient goals per problem list:   1.  Goal(s): Patient will participate in aftercare plan  Met:  Yes  Target date: 3-5 days from date of admission   As evidenced by: Patient will participate within aftercare plan AEB aftercare provider and housing plan at discharge being identified.  02/10/15: CSW to work with Pt to assess for appropriate discharge plan and faciliate appointments and referrals as needed prior to d/c. 02/12/15: Pt will return home and follow-up with his PCP; Pt requests to continue seeing his PCP for medication management. He is not interested in a therapy referral at this time.   2.  Goal (s): Patient will exhibit decreased depressive symptoms and  suicidal ideations.  Met:  Adequate for DC  Target date: 3-5 days from date of admission   As evidenced by: Patient will utilize self rating of depression at 3 or below and demonstrate decreased signs of depression or be deemed stable for discharge by MD. 02/10/15: Pt was admitted with symptoms of depression, rating 10/10. Pt continues to present with flat affect and depressive symptoms.  Pt will demonstrate decreased symptoms of depression and rate depression at 3/10 or lower prior to discharge. 02/12/15: MD feels that Pt's symptoms have decreased to the point that they can be managed in an outpatient setting.   Attendees:  Patient:    Family:    Physician: Dr. Parke Poisson, MD  02/12/2015 3:50 PM  Nursing: Lars Pinks, RN Case manager  02/12/2015 3:50 PM  Clinical Social Worker Peri Maris, [place] 02/12/2015 3:50 PM  Other: Tilden Fossa, [place] 02/12/2015 3:50 PM  Clinical: Darrol Angel RN 02/12/2015 3:50 PM  Other: , RN Charge Nurse 02/12/2015 3:50 PM  Other:    Peri Maris, [place] Social Work [phone]

## 2015-02-12 NOTE — Progress Notes (Signed)
Discharge note: pt received both written and verbal discharge instructions. Pt verbalized understanding of discharge instructions. Pt agreed to f/u appt and med regimen. Pt received prescriptions and belongings.  Pt safely discharge to lobby.

## 2015-02-12 NOTE — Progress Notes (Signed)
Recreation Therapy Notes  Date: 01.04.2017 Time: 9:30am Location: [address] Group Room   Group Topic: Stress Management  Goal Area(s) Addresses:  Patient will actively participate in stress management techniques presented during session.   Behavioral Response: Did not attend.   Laureen Ochs [name], LRT/CTRS        Lane Hacker 02/12/2015 12:48 PM
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
## 2015-02-12 NOTE — [facility] Group Notes (Signed)
[facility] LCSW Aftercare Discharge Planning Group Note  02/12/2015 8:45 AM  Pt did not attend, declined invitation.   [name], [place] 02/12/2015 9:48 AM
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
## 2015-02-12 NOTE — Progress Notes (Signed)
ANTICOAGULATION CONSULT NOTE - Follow Up Consult  Pharmacy Consult for coumadin Indication: DVT/A-fib  Allergies  Allergen Reactions  . Aspirin Nausea And Vomiting    Vital Signs: Temp: 97.6 F (36.4 C) (01/04 0806) Temp Source: Oral (01/04 0806) BP: 118/73 mmHg (01/04 0808) Pulse Rate: 65 (01/04 0808)  Labs:  Recent Labs  02/10/15 0653 02/11/15 0615 02/12/15 0632  LABPROT 29.6* 28.8* 30.0*  INR 2.87* 2.77* 2.93*    CrCl cannot be calculated (Unknown ideal weight.).   Medications:  Scheduled:  . atorvastatin  40 mg Oral Daily  . diltiazem  240 mg Oral Daily  . doxycycline  100 mg Oral Q12H  . escitalopram  5 mg Oral Daily  . gabapentin  100 mg Oral QHS  . metoprolol  100 mg Oral BID  . traZODone  50 mg Oral QHS,MR X 1  . Warfarin - Pharmacist Dosing Inpatient   Does not apply q1800    Assessment: INR 2.93.  At high end of goal.  No problems noted  Goal of Therapy:  INR 2-3    Plan:  Coumadin 2 mg x 1 today  INR in am   Lenox Ponds 02/12/2015,9:12 AM

## 2015-02-13 ENCOUNTER — Other Ambulatory Visit (HOSPITAL_COMMUNITY): Payer: Commercial Managed Care - HMO

## 2015-02-13 ENCOUNTER — Ambulatory Visit: Payer: Commercial Managed Care - HMO | Admitting: Vascular Surgery

## 2015-02-19 ENCOUNTER — Encounter: Payer: Self-pay | Admitting: Vascular Surgery

## 2015-02-19 ENCOUNTER — Other Ambulatory Visit: Payer: Self-pay | Admitting: Internal Medicine

## 2015-02-19 ENCOUNTER — Other Ambulatory Visit: Payer: Self-pay | Admitting: Cardiology

## 2015-02-19 DIAGNOSIS — R911 Solitary pulmonary nodule: Secondary | ICD-10-CM

## 2015-02-20 ENCOUNTER — Encounter: Payer: Self-pay | Admitting: Vascular Surgery

## 2015-02-20 ENCOUNTER — Ambulatory Visit (INDEPENDENT_AMBULATORY_CARE_PROVIDER_SITE_OTHER): Payer: Medicare Other | Admitting: Vascular Surgery

## 2015-02-20 ENCOUNTER — Encounter: Payer: Medicare Other | Admitting: Vascular Surgery

## 2015-02-20 VITALS — BP 123/90 | HR 63 | Temp 97.2°F | Ht 70.0 in | Wt 181.3 lb

## 2015-02-20 DIAGNOSIS — I739 Peripheral vascular disease, unspecified: Secondary | ICD-10-CM

## 2015-02-20 DIAGNOSIS — I742 Embolism and thrombosis of arteries of the upper extremities: Secondary | ICD-10-CM | POA: Diagnosis not present

## 2015-02-20 DIAGNOSIS — Z7901 Long term (current) use of anticoagulants: Secondary | ICD-10-CM | POA: Diagnosis not present

## 2015-02-20 DIAGNOSIS — I480 Paroxysmal atrial fibrillation: Secondary | ICD-10-CM | POA: Diagnosis not present

## 2015-02-20 DIAGNOSIS — Z5181 Encounter for therapeutic drug level monitoring: Secondary | ICD-10-CM | POA: Diagnosis not present

## 2015-02-20 NOTE — Progress Notes (Signed)
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 He recently underwent a left brachial to ulnar artery bypass with right greater saphenous vein for progressive ischemia of the left hand. He returns today for postoperative follow-up. He still has some occasional numbness and tingling in his left hand but overall this is improved. He has motor function in the hand.  Physical exam:  Filed Vitals:   02/20/15 1157  BP: 123/90  Pulse: 63  Temp: 97.2 F (36.2 C)  TempSrc: Oral  Height: 5\' 10"  (1.778 m)  Weight: 181 lb 4.8 oz (82.237 kg)  SpO2: 97%    Left upper extremity: Palpable graft pulse in the axilla brachial and adjacent to the ulnar artery incision. No palpable radial pulse, hand is pink and warm  Neuro: Hand intrinsics left hand are intact  Right lower extremity: All saphenectomy sites are well healed staples were removed today  Assessment: Doing well status post left brachial to ulnar artery bypass graft   Plan: Patient was again advised to refrain from smoking and told he has risk of losing his left upper extremity if he continues to smoke. #2 the patient will follow-up in 3 months time for graft duplex scan.  Ruta Hinds, MD [facility] Office: [phone] Pager: [phone]

## 2015-02-25 ENCOUNTER — Encounter: Payer: Self-pay | Admitting: Family

## 2015-02-25 ENCOUNTER — Ambulatory Visit
Admission: RE | Admit: 2015-02-25 | Discharge: 2015-02-25 | Disposition: A | Discharge status: Home / Self Care | Payer: Medicare Other | Source: Ambulatory Visit | Attending: Internal Medicine | Admitting: Internal Medicine

## 2015-02-25 ENCOUNTER — Ambulatory Visit (INDEPENDENT_AMBULATORY_CARE_PROVIDER_SITE_OTHER): Payer: Medicare Other | Admitting: Family

## 2015-02-25 VITALS — BP 114/79 | HR 66 | Temp 97.3°F | Ht 70.0 in | Wt 175.0 lb

## 2015-02-25 DIAGNOSIS — R918 Other nonspecific abnormal finding of lung field: Secondary | ICD-10-CM | POA: Diagnosis not present

## 2015-02-25 DIAGNOSIS — I721 Aneurysm of artery of upper extremity: Secondary | ICD-10-CM

## 2015-02-25 DIAGNOSIS — F17200 Nicotine dependence, unspecified, uncomplicated: Secondary | ICD-10-CM

## 2015-02-25 DIAGNOSIS — Z720 Tobacco use: Secondary | ICD-10-CM

## 2015-02-25 DIAGNOSIS — I771 Stricture of artery: Secondary | ICD-10-CM

## 2015-02-25 DIAGNOSIS — I739 Peripheral vascular disease, unspecified: Secondary | ICD-10-CM

## 2015-02-25 DIAGNOSIS — R911 Solitary pulmonary nodule: Secondary | ICD-10-CM

## 2015-02-25 NOTE — Patient Instructions (Signed)
Smoking Cessation, Tips for Success If you are ready to quit smoking, congratulations! You have chosen to help yourself be healthier. Cigarettes bring nicotine, tar, carbon monoxide, and other irritants into your body. Your lungs, heart, and blood vessels will be able to work better without these poisons. There are many different ways to quit smoking. Nicotine gum, nicotine patches, a nicotine inhaler, or nicotine nasal spray can help with physical craving. Hypnosis, support groups, and medicines help break the habit of smoking. WHAT THINGS CAN I DO TO MAKE QUITTING EASIER?  Here are some tips to help you quit for good:  Pick a date when you will quit smoking completely. Tell all of your friends and family about your plan to quit on that date.  Do not try to slowly cut down on the number of cigarettes you are smoking. Pick a quit date and quit smoking completely starting on that day.  Throw away all cigarettes.   Clean and remove all ashtrays from your home, work, and car.  On a card, write down your reasons for quitting. Carry the card with you and read it when you get the urge to smoke.  Cleanse your body of nicotine. Drink enough water and fluids to keep your urine clear or pale yellow. Do this after quitting to flush the nicotine from your body.  Learn to predict your moods. Do not let a bad situation be your excuse to have a cigarette. Some situations in your life might tempt you into wanting a cigarette.  Never have "just one" cigarette. It leads to wanting another and another. Remind yourself of your decision to quit.  Change habits associated with smoking. If you smoked while driving or when feeling stressed, try other activities to replace smoking. Stand up when drinking your coffee. Brush your teeth after eating. Sit in a different chair when you read the paper. Avoid alcohol while trying to quit, and try to drink fewer caffeinated beverages. Alcohol and caffeine may urge you to  smoke.  Avoid foods and drinks that can trigger a desire to smoke, such as sugary or spicy foods and alcohol.  Ask people who smoke not to smoke around you.  Have something planned to do right after eating or having a cup of coffee. For example, plan to take a walk or exercise.  Try a relaxation exercise to calm you down and decrease your stress. Remember, you may be tense and nervous for the first 2 weeks after you quit, but this will pass.  Find new activities to keep your hands busy. Play with a pen, coin, or rubber band. Doodle or draw things on paper.  Brush your teeth right after eating. This will help cut down on the craving for the taste of tobacco after meals. You can also try mouthwash.   Use oral substitutes in place of cigarettes. Try using lemon drops, carrots, cinnamon sticks, or chewing gum. Keep them handy so they are available when you have the urge to smoke.  When you have the urge to smoke, try deep breathing.  Designate your home as a nonsmoking area.  If you are a heavy smoker, ask your health care provider about a prescription for nicotine chewing gum. It can ease your withdrawal from nicotine.  Reward yourself. Set aside the cigarette money you save and buy yourself something nice.  Look for support from others. Join a support group or smoking cessation program. Ask someone at home or at work to help you with your plan   to quit smoking.  Always ask yourself, "Do I need this cigarette or is this just a reflex?" Tell yourself, "Today, I choose not to smoke," or "I do not want to smoke." You are reminding yourself of your decision to quit.  Do not replace cigarette smoking with electronic cigarettes (commonly called e-cigarettes). The safety of e-cigarettes is unknown, and some may contain harmful chemicals.  If you relapse, do not give up! Plan ahead and think about what you will do the next time you get the urge to smoke. HOW WILL I FEEL WHEN I QUIT SMOKING? You  may have symptoms of withdrawal because your body is used to nicotine (the addictive substance in cigarettes). You may crave cigarettes, be irritable, feel very hungry, cough often, get headaches, or have difficulty concentrating. The withdrawal symptoms are only temporary. They are strongest when you first quit but will go away within 10-14 days. When withdrawal symptoms occur, stay in control. Think about your reasons for quitting. Remind yourself that these are signs that your body is healing and getting used to being without cigarettes. Remember that withdrawal symptoms are easier to treat than the major diseases that smoking can cause.  Even after the withdrawal is over, expect periodic urges to smoke. However, these cravings are generally short lived and will go away whether you smoke or not. Do not smoke! WHAT RESOURCES ARE AVAILABLE TO HELP ME QUIT SMOKING? Your health care provider can direct you to community resources or hospitals for support, which may include:  Group support.  Education.  Hypnosis.  Therapy.   This information is not intended to replace advice given to you by your health care provider. Make sure you discuss any questions you have with your health care provider.   Document Released: 10/24/2003 Document Revised: 02/15/2014 Document Reviewed: 07/13/2012 Elsevier Interactive Patient Education 2016 Elsevier Inc.    Steps to Quit Smoking  Smoking tobacco can be harmful to your health and can affect almost every organ in your body. Smoking puts you, and those around you, at risk for developing many serious chronic diseases. Quitting smoking is difficult, but it is one of the best things that you can do for your health. It is never too late to quit. WHAT ARE THE BENEFITS OF QUITTING SMOKING? When you quit smoking, you lower your risk of developing serious diseases and conditions, such as:  Lung cancer or lung disease, such as COPD.  Heart disease.  Stroke.  Heart  attack.  Infertility.  Osteoporosis and bone fractures. Additionally, symptoms such as coughing, wheezing, and shortness of breath may get better when you quit. You may also find that you get sick less often because your body is stronger at fighting off colds and infections. If you are pregnant, quitting smoking can help to reduce your chances of having a baby of low birth weight. HOW DO I GET READY TO QUIT? When you decide to quit smoking, create a plan to make sure that you are successful. Before you quit:  Pick a date to quit. Set a date within the next two weeks to give you time to prepare.  Write down the reasons why you are quitting. Keep this list in places where you will see it often, such as on your bathroom mirror or in your car or wallet.  Identify the people, places, things, and activities that make you want to smoke (triggers) and avoid them. Make sure to take these actions:  Throw away all cigarettes at home, at work,   and in your car.  Throw away smoking accessories, such as ashtrays and lighters.  Clean your car and make sure to empty the ashtray.  Clean your home, including curtains and carpets.  Tell your family, friends, and coworkers that you are quitting. Support from your loved ones can make quitting easier.  Talk with your health care provider about your options for quitting smoking.  Find out what treatment options are covered by your health insurance. WHAT STRATEGIES CAN I USE TO QUIT SMOKING?  Talk with your healthcare provider about different strategies to quit smoking. Some strategies include:  Quitting smoking altogether instead of gradually lessening how much you smoke over a period of time. Research shows that quitting "cold turkey" is more successful than gradually quitting.  Attending in-person counseling to help you build problem-solving skills. You are more likely to have success in quitting if you attend several counseling sessions. Even short  sessions of 10 minutes can be effective.  Finding resources and support systems that can help you to quit smoking and remain smoke-free after you quit. These resources are most helpful when you use them often. They can include:  Online chats with a counselor.  Telephone quitlines.  Printed self-help materials.  Support groups or group counseling.  Text messaging programs.  Mobile phone applications.  Taking medicines to help you quit smoking. (If you are pregnant or breastfeeding, talk with your health care provider first.) Some medicines contain nicotine and some do not. Both types of medicines help with cravings, but the medicines that include nicotine help to relieve withdrawal symptoms. Your health care provider may recommend:  Nicotine patches, gum, or lozenges.  Nicotine inhalers or sprays.  Non-nicotine medicine that is taken by mouth. Talk with your health care provider about combining strategies, such as taking medicines while you are also receiving in-person counseling. Using these two strategies together makes you more likely to succeed in quitting than if you used either strategy on its own. If you are pregnant or breastfeeding, talk with your health care provider about finding counseling or other support strategies to quit smoking. Do not take medicine to help you quit smoking unless told to do so by your health care provider. WHAT THINGS CAN I DO TO MAKE IT EASIER TO QUIT? Quitting smoking might feel overwhelming at first, but there is a lot that you can do to make it easier. Take these important actions:  Reach out to your family and friends and ask that they support and encourage you during this time. Call telephone quitlines, reach out to support groups, or work with a counselor for support.  Ask people who smoke to avoid smoking around you.  Avoid places that trigger you to smoke, such as bars, parties, or smoke-break areas at work.  Spend time around people who do  not smoke.  Lessen stress in your life, because stress can be a smoking trigger for some people. To lessen stress, try:  Exercising regularly.  Deep-breathing exercises.  Yoga.  Meditating.  Performing a body scan. This involves closing your eyes, scanning your body from head to toe, and noticing which parts of your body are particularly tense. Purposefully relax the muscles in those areas.  Download or purchase mobile phone or tablet apps (applications) that can help you stick to your quit plan by providing reminders, tips, and encouragement. There are many free apps, such as QuitGuide from the CDC (Centers for Disease Control and Prevention). You can find other support for quitting smoking (smoking   cessation) through smokefree.gov and other websites. HOW WILL I FEEL WHEN I QUIT SMOKING? Within the first 24 hours of quitting smoking, you may start to feel some withdrawal symptoms. These symptoms are usually most noticeable 2-3 days after quitting, but they usually do not last beyond 2-3 weeks. Changes or symptoms that you might experience include:  Mood swings.  Restlessness, anxiety, or irritation.  Difficulty concentrating.  Dizziness.  Strong cravings for sugary foods in addition to nicotine.  Mild weight gain.  Constipation.  Nausea.  Coughing or a sore throat.  Changes in how your medicines work in your body.  A depressed mood.  Difficulty sleeping (insomnia). After the first 2-3 weeks of quitting, you may start to notice more positive results, such as:  Improved sense of smell and taste.  Decreased coughing and sore throat.  Slower heart rate.  Lower blood pressure.  Clearer skin.  The ability to breathe more easily.  Fewer sick days. Quitting smoking is very challenging for most people. Do not get discouraged if you are not successful the first time. Some people need to make many attempts to quit before they achieve long-term success. Do your best to  stick to your quit plan, and talk with your health care provider if you have any questions or concerns.   This information is not intended to replace advice given to you by your health care provider. Make sure you discuss any questions you have with your health care provider.   Document Released: 01/19/2001 Document Revised: 06/11/2014 Document Reviewed: 06/11/2014 Elsevier Interactive Patient Education 2016 Elsevier Inc.  

## 2015-02-25 NOTE — Progress Notes (Signed)
    Postoperative Visit   History of Present Illness  [name] is a 65 y.o. year old male who is s/p emergency surgery on November 20, 2014 for an ischemic left upper extremity. Patient had previously undergone left axillobrachial bypass graft by Dr. Juanda Crumble fields in February 2016. This was a few days following a left brachial endarterectomy and embolectomy. 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 Patient did well until he developed acute occlusion of the bypass and ischemia of the left hand. Bypass was successfully thrombectomized through the axillary approach.   He is also s/p left brachial to ulnar artery bypass using reversed right GSV on 01/27/15; this was done for chronic ischemia of left hand.   He currently is on Coumadin therapy.  He returns today for remaining staples removal in right groin which is part of his right GSV harvest incisions for the left arm bypass graft. All incisions are well healed. He c/o occasional tingling and numbness left hand, otherwise he has no complaints. He admits to smoking a ppd, decreased from 3 ppd, states he intends to quit.   For VQI Use Only  PRE-ADM LIVING: Home  AMB STATUS: Ambulatory  Physical Examination  Filed Vitals:   02/25/15 1344  BP: 114/79  Pulse: 66  Temp: 97.3 F (36.3 C)  TempSrc: Oral  Height: 5\' 10"  (1.778 m)  Weight: 175 lb (79.379 kg)  SpO2: 100%    Body mass index is 25.11 kg/(m^2).  Right groin and leg harvest site incisions for GSV have healed; left arm incisions have healed; no signs of infection. Right groin staples removed; bilateral DP pedal pulses are 2+. Left radial pulse is not palpable, left ulnar pulse is palpable. Fingers of left hand are warm, pink, with brisk capillary refill.   Medical Decision Making  [name] is a 65 y.o. year old male who presents s/p left brachial to ulnar artery bypass using reversed right GSV on 01/27/15; this was done for  chronic ischemia of left hand.  The patient's bypass incisions are healing appropriately with resolution of pre-operative symptoms. I discussed in depth with the patient the nature of atherosclerosis, and emphasized the importance of maximal medical management including strict control of blood pressure, blood glucose, and lipid levels, obtaining regular exercise, and cessation of smoking.  The patient is aware that without maximal medical management the underlying atherosclerotic disease process will progress, limiting the benefit of any interventions. The patient was counseled re smoking cessation and given several free resources re smoking cessation.  Follow up as already scheduled in July 2017 with duplex and Dr. Oneida Alar. I emphasized the importance of routine surveillance of the patient's bypass, as the vascular surgery literature emphasize the improved patency possible with assisted primary patency procedures versus secondary patency procedures. The patient agrees to participate in their maximal medical care and routine surveillance.  Thank you for allowing [place] to participate in this patient's care.  Nimrat Woolworth, Sharmon Leyden, RN, MSN, FNP-C [facility] Office: [phone]  02/25/2015, 1:45 PM  Clinic MD: Kellie Simmering

## 2015-03-06 ENCOUNTER — Other Ambulatory Visit: Payer: Self-pay | Admitting: Cardiology

## 2015-03-19 ENCOUNTER — Telehealth: Payer: Self-pay | Admitting: Cardiology

## 2015-03-19 NOTE — Telephone Encounter (Signed)
NeW Message  Pt calling to speakw / RN concerning cheaper medication alternatives. Please call back and discuss.

## 2015-03-19 NOTE — Telephone Encounter (Signed)
LMTCB

## 2015-03-20 DIAGNOSIS — I10 Essential (primary) hypertension: Secondary | ICD-10-CM | POA: Diagnosis not present

## 2015-03-20 DIAGNOSIS — Z125 Encounter for screening for malignant neoplasm of prostate: Secondary | ICD-10-CM | POA: Diagnosis not present

## 2015-03-20 DIAGNOSIS — E784 Other hyperlipidemia: Secondary | ICD-10-CM | POA: Diagnosis not present

## 2015-03-20 DIAGNOSIS — R8299 Other abnormal findings in urine: Secondary | ICD-10-CM | POA: Diagnosis not present

## 2015-03-25 DIAGNOSIS — Z7901 Long term (current) use of anticoagulants: Secondary | ICD-10-CM | POA: Diagnosis not present

## 2015-03-25 NOTE — Telephone Encounter (Signed)
Left message for pt to call.

## 2015-03-26 ENCOUNTER — Other Ambulatory Visit: Payer: Self-pay | Admitting: Cardiology

## 2015-03-26 MED ORDER — DILTIAZEM HCL ER 240 MG PO CP24: 240.0000 mg | ORAL_CAPSULE | Freq: Every day | ORAL | Status: DC

## 2015-03-26 NOTE — Telephone Encounter (Signed)
Diltiazem refilled Warfarin refill routed to pharmacist - last INR 01/2015 OV with MD in March 2017

## 2015-03-26 NOTE — Telephone Encounter (Signed)
Looks like he needs to be seen

## 2015-03-26 NOTE — Telephone Encounter (Signed)
°*  STAT* If patient is at the pharmacy, call can be transferred to refill team.   1. Which medications need to be refilled? (please list name of each medication and dose if known) Diltiazem and Warfarin-changing to a new pharmacy 2. Which pharmacy/location (including street and city if local pharmacy) is medication to be sent to?[place] [phone]  3. Do they need a 30 day or 90 day supply? 30 and refills

## 2015-03-27 ENCOUNTER — Ambulatory Visit: Payer: Self-pay

## 2015-03-27 DIAGNOSIS — I480 Paroxysmal atrial fibrillation: Secondary | ICD-10-CM

## 2015-03-27 DIAGNOSIS — Z5181 Encounter for therapeutic drug level monitoring: Secondary | ICD-10-CM

## 2015-03-27 DIAGNOSIS — Z7901 Long term (current) use of anticoagulants: Secondary | ICD-10-CM | POA: Diagnosis not present

## 2015-04-24 DIAGNOSIS — I480 Paroxysmal atrial fibrillation: Secondary | ICD-10-CM | POA: Diagnosis not present

## 2015-04-24 DIAGNOSIS — Z7901 Long term (current) use of anticoagulants: Secondary | ICD-10-CM | POA: Diagnosis not present

## 2015-04-24 DIAGNOSIS — Z23 Encounter for immunization: Secondary | ICD-10-CM | POA: Diagnosis not present

## 2015-04-25 NOTE — Progress Notes (Signed)
HPI: FU atrial fibrillation (h/o TIA) and hypertension. Holter monitor in June 2015 showed rate controlled atrial fibrillation. Has had left brachial to ulnar artery bypass for ischemic left hand. Also seen by neurology during admission 10/16 for complaints of amaurosis fugax; MRI showed multiple areas of acute infarct. Since last seen the patient has dyspnea with more extreme activities but not with routine activities. It is relieved with rest. It is not associated with chest pain. There is no orthopnea, PND or pedal edema. There is no syncope or palpitations. There is no exertional chest pain.  Studies:  - Echo (10/16): Normal LV function, severe LAE, mild MR - Nuclear (05/2007): EF 51%, no ischemia or infarct  - Abd ultrasound 2/16 showed AAA 3.3 cm.  Current Outpatient Prescriptions  Medication Sig Dispense Refill  . acetaminophen (TYLENOL) 500 MG tablet Take 2 tablets (1,000 mg total) by mouth 2 (two) times daily. For pain/fever 30 tablet 0  . albuterol (PROVENTIL HFA;VENTOLIN HFA) 108 (90 Base) MCG/ACT inhaler Inhale 2 puffs into the lungs every 6 (six) hours as needed for wheezing or shortness of breath.    Marland Kitchen atorvastatin (LIPITOR) 40 MG tablet Take 1 tablet (40 mg total) by mouth daily. For high cholesterol 90 tablet 3  . diltiazem (DILACOR XR) 240 MG 24 hr capsule Take 1 capsule (240 mg total) by mouth daily. For high blood pressure 30 capsule 1  . gabapentin (NEURONTIN) 100 MG capsule Take 1 capsule (100 mg total) by mouth at bedtime. For agitation 30 capsule 0  . metoprolol (LOPRESSOR) 100 MG tablet TAKE 1 TABLET TWICE DAILY 180 tablet 0  . oxyCODONE-acetaminophen (PERCOCET/ROXICET) 5-325 MG tablet Take 1-2 tablets by mouth every 4 (four) hours as needed for moderate pain. 30 tablet 0  . warfarin (COUMADIN) 5 MG tablet Take 1 tablet (5 mg total) by mouth daily at 6 PM. Take as directed.  Dose may vary pending INR: For blood clot prevention     No current facility-administered  medications for this visit.     Past Medical History  Diagnosis Date  . Atrial fibrillation ([place])   . Hypertension   . Hyperlipidemia   . COPD (chronic obstructive pulmonary disease) ([place])   . Amaurosis fugax of left eye     history of   . Hx of echocardiogram     Echo (07/09/13):  Mild LVH, EF 55-60%, MAC, mild MR, severe LAE, mod RAE  . Renal insufficiency   . Stroke [facility])     Past Surgical History  Procedure Laterality Date  . Embolectomy Left 04/05/2014    Procedure: THROMBO ENDARTERECTOMY OF LEFT BRACHIAL, RADIAL AND ULNAR ARTERY,  Left Radial artery cut down and radial artery thrombectomy.;  Surgeon: Elam Dutch, MD;  Location: [facility];  Service: Vascular;  Laterality: Left;  . Patch angioplasty Left 04/05/2014    Procedure: LEFT ARM VEIN PATCH ANGIOPLASTY;  Surgeon: Elam Dutch, MD;  Location: [place];  Service: Vascular;  Laterality: Left;  . Arch aortogram N/A 04/05/2014    Procedure: ARCH AORTOGRAM, FIRST ORDER CATHETERIZATION LEFT SUBCLAVIAN ARTERY;  Surgeon: Elam Dutch, MD;  Location: [place];  Service: Vascular;  Laterality: N/A;  . Axillary-femoral bypass graft Left 04/08/2014    Procedure: LEFT AXILLARY ARTERY TO BRACHIAL ARTERY BYPASS USING NON REVERSE LEFT GREATER SAPHENOUS VEIN ,LIGATION OF LEFT AXILLARY ARTERY ANEURYSM;  Surgeon: Elam Dutch, MD;  Location: [place];  Service: Vascular;  Laterality: Left;  . Vein harvest Left  04/08/2014    Procedure: LEFT GREATER SAPHENOUS VEIN HARVEST;  Surgeon: Elam Dutch, MD;  Location: [place];  Service: Vascular;  Laterality: Left;  . Thrombectomy brachial artery Left 11/20/2014    Procedure: 1.  Thrombectomy Left Axilo-Brachial Bypass  2.  Thromboendarterecotmy of Left Brachial Artery with Fogarty Thrombectomy of Radial and Ulnar Arteries with Dacron patch angioplasty Left Brachial Artery. 3. Intraoperative  Arteriogram times four.;  Surgeon: Mal Misty, MD;  Location: [place];  Service: Vascular;  Laterality: Left;    . Peripheral vascular catheterization N/A 01/22/2015    Procedure: Aortic Arch Angiography;  Surgeon: Elam Dutch, MD;  Location: [place] [facility];  Service: Cardiovascular;  Laterality: N/A;  . Bypass axilla/brachial artery Left 01/27/2015    Procedure: LEFT BRACHIAL-ULAR ARTERY BYPASS USING GREATER SAPHENOUS VEIN;  Surgeon: Elam Dutch, MD;  Location: [place];  Service: Vascular;  Laterality: Left;  . Vein harvest Right 01/27/2015    Procedure: RIGHT GREATER SAPHENOUS VEIN HARVEST;  Surgeon: Elam Dutch, MD;  Location: [place];  Service: Vascular;  Laterality: Right;    Social History   Social History  . Marital Status: Divorced    Spouse Name: N/A  . Number of Children: N/A  . 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 Smoking status: Former Smoker -- 2.00 packs/day for 45 years    Types: Cigarettes    Quit date: 11/25/2014  . Smokeless tobacco: Never Used  . Alcohol Use: No  . Drug Use: No  . Sexual Activity: Not Currently   Other Topics Concern  . Not on file   Social History Narrative    Family History  Problem Relation Age of Onset  . Hypertension Mother   . Cancer Mother   . Cancer Father     gastric cancer  . Cancer Sister   . Early death Neg Hx   . Heart disease Neg Hx   . Hyperlipidemia Neg Hx   . Kidney disease Neg Hx   . Stroke Neg Hx     ROS: no fevers or chills, productive cough, hemoptysis, dysphasia, odynophagia, melena, hematochezia, dysuria, hematuria, rash, seizure activity, orthopnea, PND, pedal edema, claudication. Remaining systems are negative.  Physical Exam: Well-developed well-nourished in no acute distress.  Skin is warm and dry.  HEENT is normal.  Neck is supple.  Chest is clear to auscultation with normal expansion.  Cardiovascular exam is irregular Abdominal exam nontender or distended. No masses palpated. Extremities show no edema. neuro grossly  intact  ECG 02/07/2015-atrial fibrillation with slow ventricular response.

## 2015-05-02 ENCOUNTER — Encounter: Payer: Self-pay | Admitting: Cardiology

## 2015-05-02 ENCOUNTER — Ambulatory Visit (INDEPENDENT_AMBULATORY_CARE_PROVIDER_SITE_OTHER): Payer: Medicare Other | Admitting: Cardiology

## 2015-05-02 VITALS — BP 152/96 | HR 68 | Ht 70.0 in | Wt 185.0 lb

## 2015-05-02 DIAGNOSIS — I714 Abdominal aortic aneurysm, without rupture, unspecified: Secondary | ICD-10-CM | POA: Insufficient documentation

## 2015-05-02 NOTE — Assessment & Plan Note (Signed)
Continue statin. 

## 2015-05-02 NOTE — Assessment & Plan Note (Signed)
Continue beta blocker and Cardizem for rate control. Continue Coumadin. Pt provided names of DOACs; he will check price and contact us if he can afford.

## 2015-05-02 NOTE — Assessment & Plan Note (Signed)
Patient counseled on discontinuing. 

## 2015-05-02 NOTE — Assessment & Plan Note (Signed)
Blood pressure elevated. I have asked him to follow this at home.If it continues we will add additional medications.

## 2015-05-02 NOTE — Patient Instructions (Signed)
Your physician has requested that you have an abdominal aorta duplex. During this test, an ultrasound is used to evaluate the aorta. Allow 30 minutes for this exam. Do not eat after midnight the day before and avoid carbonated beverages.  Your physician wants you to follow-up in: [place]. You will receive a reminder letter in the mail two months in advance. If you don't receive a letter, please call our office to schedule the follow-up appointment.  If you need a refill on your cardiac medications before your next appointment, please call your pharmacy.

## 2015-05-02 NOTE — Assessment & Plan Note (Signed)
Schedule follow-up ultrasound.

## 2015-05-05 DIAGNOSIS — K921 Melena: Secondary | ICD-10-CM | POA: Diagnosis not present

## 2015-05-05 DIAGNOSIS — R197 Diarrhea, unspecified: Secondary | ICD-10-CM | POA: Diagnosis not present

## 2015-05-05 DIAGNOSIS — Z7901 Long term (current) use of anticoagulants: Secondary | ICD-10-CM | POA: Diagnosis not present

## 2015-05-05 DIAGNOSIS — Z6826 Body mass index (BMI) 26.0-26.9, adult: Secondary | ICD-10-CM | POA: Diagnosis not present

## 2015-05-05 DIAGNOSIS — R1084 Generalized abdominal pain: Secondary | ICD-10-CM | POA: Diagnosis not present

## 2015-05-08 ENCOUNTER — Encounter: Payer: Self-pay | Admitting: Gastroenterology

## 2015-05-08 DIAGNOSIS — L718 Other rosacea: Secondary | ICD-10-CM | POA: Diagnosis not present

## 2015-05-13 ENCOUNTER — Ambulatory Visit (HOSPITAL_COMMUNITY)
Admission: RE | Admit: 2015-05-13 | Discharge: 2015-05-13 | Disposition: A | Discharge status: Home / Self Care | Payer: Medicare Other | Source: Ambulatory Visit | Attending: Cardiovascular Disease | Admitting: Cardiovascular Disease

## 2015-05-13 DIAGNOSIS — I708 Atherosclerosis of other arteries: Secondary | ICD-10-CM | POA: Diagnosis not present

## 2015-05-13 DIAGNOSIS — I700 Atherosclerosis of aorta: Secondary | ICD-10-CM | POA: Insufficient documentation

## 2015-05-13 DIAGNOSIS — E785 Hyperlipidemia, unspecified: Secondary | ICD-10-CM | POA: Insufficient documentation

## 2015-05-13 DIAGNOSIS — I714 Abdominal aortic aneurysm, without rupture, unspecified: Secondary | ICD-10-CM

## 2015-05-13 DIAGNOSIS — I10 Essential (primary) hypertension: Secondary | ICD-10-CM | POA: Insufficient documentation

## 2015-06-02 DIAGNOSIS — Z7901 Long term (current) use of anticoagulants: Secondary | ICD-10-CM | POA: Diagnosis not present

## 2015-06-02 DIAGNOSIS — I480 Paroxysmal atrial fibrillation: Secondary | ICD-10-CM | POA: Diagnosis not present

## 2015-06-04 ENCOUNTER — Other Ambulatory Visit: Payer: Self-pay

## 2015-06-04 MED ORDER — METOPROLOL TARTRATE 100 MG PO TABS: 100.0000 mg | ORAL_TABLET | Freq: Two times a day (BID) | ORAL | Status: DC

## 2015-06-05 ENCOUNTER — Encounter: Payer: Self-pay | Admitting: Neurology

## 2015-06-05 ENCOUNTER — Ambulatory Visit (INDEPENDENT_AMBULATORY_CARE_PROVIDER_SITE_OTHER): Payer: Medicare Other | Admitting: Neurology

## 2015-06-05 VITALS — BP 163/100 | HR 78 | Ht 70.0 in | Wt 183.8 lb

## 2015-06-05 DIAGNOSIS — I6529 Occlusion and stenosis of unspecified carotid artery: Secondary | ICD-10-CM | POA: Diagnosis not present

## 2015-06-05 NOTE — Patient Instructions (Signed)
I had a long d/w patient about his remote strokes,atrial fibrillation, risk for recurrent stroke/TIAs, personally independently reviewed imaging studies and stroke evaluation results and answered questions.Continue warfarin daily  for secondary stroke prevention and maintain strict control of hypertension with blood pressure goal below 130/90 and to see his primary physician and to visit compliant with his medications., Also  Strict control of  lipids with LDL cholesterol goal below 70 mg/dL. I strongly counseled him to quit smoking I also advised the patient to eat a healthy diet with plenty of whole grains, cereals, fruits and vegetables, exercise regularly and maintain ideal body weight .plan to check follow-up carotid ultrasound study. Followup in the future with stroke nurse practitioner in one year or call earlier if necessary

## 2015-06-05 NOTE — Progress Notes (Signed)
[facility] [address] [place]. [place] [address] [phone]       OFFICE FOLLOW-UP NOTE  [name] Date of Birth:  [birth year] Medical Record Number:  [id number]   HPI: [name] is a 43 year Caucasian male today seen for first office follow-up visit following recent hospital admission and stroke.[name] is an 65 y.o. male with a history of atrial fibrillation, hypertension, hyperlipidemia, COPD, amaurosis fugax and peripheral vascular disease, admitted on 11/20/2014 for acute occlusion of left axillobrachial bypass. He is status post extensive left brachial endarterectomy and angioplasty, also performed on 11/20/2014. Patient noticed blurring of vision on the left side on 01/21/2015. He has a history of amaurosis fugax involving his right eye which resolved. MRI of his brain showed multiple areas of acute infarct involving the cerebellum bilaterally as well as right temporal lobe and occipital lobes bilaterally consistent with embolic phenomena. Patient has no previous history of stroke. He has been on Coumadin for atrial fibrillation and INR on admission was 1.45. INR today was 1.85. NIH stroke score at the time of this evaluation was 0. LKW: Unclear tPA Given: No. Patient was not administered TPA secondary to unclear time of onset of embolic strokes. MRI scan of the brain showing multiple areas of acute infarcts involving bilateral cerebellum, right temporal lobe and occipital lobes bilaterally consistent with cerebral emboli. Carotid Doppler showed mild calcific plaques but no significant extracranial stenosis. Transthoracic echo showed normal ejection fraction without definite cardiac source of embolism. Patient had history of atrial fibrillation and was on warfarin and INR was 1.8 the day of his strokes. Lipid profile showed total cholesterol 143, triglycerides 101, HDL 41 and LDL 82 mg percent. Hemoglobin A1c was 5.4. Patient complained of blurred  vision bilaterally and loss of central vision in the right eye. However his vision acuity and fields appear normal. Patient states he is doing better since discharge his incision however still remains blurred. He has trouble reading but he has full field of vision. He remains on warfarin and his INR continues to fluctuate. Patient states he cannot afford the January 2 now will need anticoagulation because of cost factor. He states his blood pressure is doing well with his 125/96 today. Patient has quit smoking since this stroke and is proud of this fact. His INR today was 3.7. Update 06/05/2015 : He returns for follow-up after last visit 6 months ago. Patient states he is doing well and has not had recurrent stroke or TIA symptoms. He remains on warfarin which is tolerating well without bleeding or bruising. He states his blood pressure has been running high lately and he ran out of his blood pressure medicines but plans to start them soon. Today's blood pressure is 160/100. He remains on Lipitor which is tolerating well without muscle aches or pains. He states he had lipid profile checked a few months ago and it was fine. 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 The patient continues to smoke and has not quit smoking. He has not had carotid ultrasound done for more than 6 months. ROS:   14 system review of systems is positive for chills, fatigue, hearing loss, ringing in the ears, runny nose, and drooling, eye itching, double vision, blurred vision, shortness of breath, black stools, restless legs, daytime sleepiness, frequency of urination, blood in the urine, joint pain, rash, dizziness, headache, numbness, weakness, anxiety, nervousness and  all other systems negative  PMH:  Past Medical History  Diagnosis Date  . Atrial fibrillation ([place])   . Hypertension   . Hyperlipidemia   . COPD (chronic obstructive pulmonary  disease) ([place])   . Amaurosis fugax of left eye     history of   . Hx of echocardiogram     Echo (07/09/13):  Mild LVH, EF 55-60%, MAC, mild MR, severe LAE, mod RAE  . Renal insufficiency   . Stroke [facility])     Social History:  Social History   Social History  . Marital Status: Divorced    Spouse Name: N/A  . Number of Children: N/A  . 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 Smoking status: Current Every Day Smoker -- 2.00 packs/day for 45 years    Types: Cigarettes    Last Attempt to Quit: 11/25/2014  . Smokeless tobacco: Never Used  . Alcohol Use: No  . Drug Use: No  . Sexual Activity: Not Currently   Other Topics Concern  . Not on file   Social History Narrative    Medications:   Current Outpatient Prescriptions on File Prior to Visit  Medication Sig Dispense Refill  . acetaminophen (TYLENOL) 500 MG tablet Take 2 tablets (1,000 mg total) by mouth 2 (two) times daily. For pain/fever 30 tablet 0  . albuterol (PROVENTIL HFA;VENTOLIN HFA) 108 (90 Base) MCG/ACT inhaler Inhale 2 puffs into the lungs every 6 (six) hours as needed for wheezing or shortness of breath.    Marland Kitchen atorvastatin (LIPITOR) 40 MG tablet Take 1 tablet (40 mg total) by mouth daily. For high cholesterol 90 tablet 3  . diltiazem (DILACOR XR) 240 MG 24 hr capsule Take 1 capsule (240 mg total) by mouth daily. For high blood pressure 30 capsule 1  . gabapentin (NEURONTIN) 100 MG capsule Take 1 capsule (100 mg total) by mouth at bedtime. For agitation 30 capsule 0  . metoprolol (LOPRESSOR) 100 MG tablet Take 1 tablet (100 mg total) by mouth 2 (two) times daily. 60 tablet 10  . oxyCODONE-acetaminophen (PERCOCET/ROXICET) 5-325 MG tablet Take 1-2 tablets by mouth every 4 (four) hours as needed for moderate pain. 30 tablet 0  . warfarin (COUMADIN) 5 MG tablet Take 1 tablet (5 mg total) by mouth daily at 6 PM. Take as directed.  Dose may vary pending INR: For  blood clot prevention     No current facility-administered medications on file prior to visit.    Allergies:   Allergies  Allergen Reactions  . Aspirin Nausea And Vomiting    Physical Exam General: well developed, well nourished middle aged Caucasian male, seated, in no evident distress Head: head normocephalic and atraumatic.  Neck: supple with soft right carotid   bruit Cardiovascular: regular rate and rhythm, no murmurs Musculoskeletal: no deformity. Mild kyphosis Skin:  no rash/petichiae Vascular:  Normal pulses all extremities Filed Vitals:   06/05/15 1426  BP: 163/100  Pulse: 78   Neurologic Exam Mental Status: Awake and fully alert. Oriented to place and time. Recent and remote memory intact. Attention span, concentration and fund of knowledge appropriate. Mood and affect appropriate.  Cranial Nerves: Fundoscopic exam not done.  Pupils equal, briskly reactive to light. Extraocular movements full without nystagmus. Visual fields full to confrontation. Hearing intact. Facial sensation intact. Face, tongue, palate moves normally and symmetrically.  Motor: Normal bulk and tone. Normal strength in all tested extremity muscles. Sensory.:  intact to touch ,pinprick .position and vibratory sensation.  Coordination: Rapid alternating movements normal in all extremities. Finger-to-nose and heel-to-shin performed accurately bilaterally. Gait and Station: Arises from chair without difficulty. Stance is normal. Gait demonstrates normal stride length and balance . Unable to heel, toe and tandem walk without difficulty.  Reflexes: 1+ and symmetric. Toes downgoing.      ASSESSMENT: 34 year Caucasian male with silent bilateral anterior and posterior circulation infarcts embolic secondary to known atrial fibrillation vs post procedure event. He is doing well and stable from neurovascular standpoint.    PLAN: I had a long d/w patient about his remote strokes,atrial fibrillation, risk for  recurrent stroke/TIAs, personally independently reviewed imaging studies and stroke evaluation results and answered questions.Continue warfarin daily  for secondary stroke prevention and maintain strict control of hypertension with blood pressure goal below 130/90 and to see his primary physician and to visit compliant with his medications., Also  Strict control of  lipids with LDL cholesterol goal below 70 mg/dL. I strongly counseled him to quit smoking I also advised the patient to eat a healthy diet with plenty of whole grains, cereals, fruits and vegetables, exercise regularly and maintain ideal body weight .plan to check follow-up carotid ultrasound study. Greater than 50% time during this 25 minute visit was spent on counseling and coordination of care about his stroke risk and counseling for smoking cessation Followup in the future with stroke nurse practitioner in one year or call earlier if necessary  Antony Contras, MD S  Note: This document was prepared with digital dictation and possible smart phrase technology. Any transcriptional errors that result from this process are unintentional

## 2015-06-12 ENCOUNTER — Other Ambulatory Visit (HOSPITAL_COMMUNITY): Payer: Medicare Other

## 2015-06-12 ENCOUNTER — Ambulatory Visit (INDEPENDENT_AMBULATORY_CARE_PROVIDER_SITE_OTHER): Payer: Medicare Other

## 2015-06-12 ENCOUNTER — Ambulatory Visit: Payer: Self-pay | Admitting: Vascular Surgery

## 2015-06-12 DIAGNOSIS — I6529 Occlusion and stenosis of unspecified carotid artery: Secondary | ICD-10-CM | POA: Diagnosis not present

## 2015-06-23 ENCOUNTER — Telehealth: Payer: Self-pay | Admitting: *Deleted

## 2015-06-23 NOTE — Telephone Encounter (Signed)
Per Dr Leonie Man, LVM for patient informing him that his carotid doppler study was unremarkable. Left name, number for any questions.

## 2015-07-01 DIAGNOSIS — L718 Other rosacea: Secondary | ICD-10-CM | POA: Diagnosis not present

## 2015-07-03 ENCOUNTER — Ambulatory Visit: Payer: Self-pay | Admitting: Gastroenterology

## 2015-07-15 DIAGNOSIS — Z7901 Long term (current) use of anticoagulants: Secondary | ICD-10-CM | POA: Diagnosis not present

## 2015-07-17 ENCOUNTER — Encounter: Payer: Self-pay | Admitting: Gastroenterology

## 2015-07-21 DIAGNOSIS — I7389 Other specified peripheral vascular diseases: Secondary | ICD-10-CM | POA: Diagnosis not present

## 2015-07-21 DIAGNOSIS — I428 Other cardiomyopathies: Secondary | ICD-10-CM | POA: Diagnosis not present

## 2015-07-21 DIAGNOSIS — I480 Paroxysmal atrial fibrillation: Secondary | ICD-10-CM | POA: Diagnosis not present

## 2015-07-21 DIAGNOSIS — E784 Other hyperlipidemia: Secondary | ICD-10-CM | POA: Diagnosis not present

## 2015-07-21 DIAGNOSIS — J449 Chronic obstructive pulmonary disease, unspecified: Secondary | ICD-10-CM | POA: Diagnosis not present

## 2015-07-21 DIAGNOSIS — Z6825 Body mass index (BMI) 25.0-25.9, adult: Secondary | ICD-10-CM | POA: Diagnosis not present

## 2015-07-21 DIAGNOSIS — I721 Aneurysm of artery of upper extremity: Secondary | ICD-10-CM | POA: Diagnosis not present

## 2015-07-21 DIAGNOSIS — I10 Essential (primary) hypertension: Secondary | ICD-10-CM | POA: Diagnosis not present

## 2015-07-21 DIAGNOSIS — Z1389 Encounter for screening for other disorder: Secondary | ICD-10-CM | POA: Diagnosis not present

## 2015-07-21 DIAGNOSIS — I714 Abdominal aortic aneurysm, without rupture: Secondary | ICD-10-CM | POA: Diagnosis not present

## 2015-07-21 DIAGNOSIS — I6990 Unspecified sequelae of unspecified cerebrovascular disease: Secondary | ICD-10-CM | POA: Diagnosis not present

## 2015-07-21 DIAGNOSIS — Z7901 Long term (current) use of anticoagulants: Secondary | ICD-10-CM | POA: Diagnosis not present

## 2015-08-20 ENCOUNTER — Encounter: Payer: Self-pay | Admitting: Vascular Surgery

## 2015-08-21 ENCOUNTER — Encounter: Payer: Self-pay | Admitting: Vascular Surgery

## 2015-08-21 ENCOUNTER — Ambulatory Visit (HOSPITAL_COMMUNITY)
Admission: RE | Admit: 2015-08-21 | Discharge: 2015-08-21 | Disposition: A | Discharge status: Home / Self Care | Payer: Medicare Other | Source: Ambulatory Visit | Attending: Vascular Surgery | Admitting: Vascular Surgery

## 2015-08-21 ENCOUNTER — Ambulatory Visit (INDEPENDENT_AMBULATORY_CARE_PROVIDER_SITE_OTHER): Payer: Medicare Other | Admitting: Vascular Surgery

## 2015-08-21 VITALS — BP 113/79 | HR 83 | Temp 98.4°F | Resp 16 | Ht 70.5 in | Wt 168.5 lb

## 2015-08-21 DIAGNOSIS — I429 Cardiomyopathy, unspecified: Secondary | ICD-10-CM | POA: Diagnosis not present

## 2015-08-21 DIAGNOSIS — I480 Paroxysmal atrial fibrillation: Secondary | ICD-10-CM | POA: Diagnosis not present

## 2015-08-21 DIAGNOSIS — I6529 Occlusion and stenosis of unspecified carotid artery: Secondary | ICD-10-CM | POA: Diagnosis not present

## 2015-08-21 DIAGNOSIS — I771 Stricture of artery: Secondary | ICD-10-CM

## 2015-08-21 DIAGNOSIS — Z7901 Long term (current) use of anticoagulants: Secondary | ICD-10-CM | POA: Diagnosis not present

## 2015-08-21 DIAGNOSIS — Z95828 Presence of other vascular implants and grafts: Secondary | ICD-10-CM | POA: Insufficient documentation

## 2015-08-21 DIAGNOSIS — Z48812 Encounter for surgical aftercare following surgery on the circulatory system: Secondary | ICD-10-CM | POA: Diagnosis not present

## 2015-08-21 DIAGNOSIS — F17200 Nicotine dependence, unspecified, uncomplicated: Secondary | ICD-10-CM | POA: Diagnosis not present

## 2015-08-21 DIAGNOSIS — Z720 Tobacco use: Secondary | ICD-10-CM | POA: Diagnosis not present

## 2015-08-21 DIAGNOSIS — I739 Peripheral vascular disease, unspecified: Secondary | ICD-10-CM

## 2015-08-21 NOTE — Progress Notes (Signed)
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 He recently underwent a left brachial to ulnar artery bypass with right greater saphenous vein for progressive ischemia of the left hand. He returns today for follow-up. He still has some occasional numbness and tingling in his left hand but overall this continues to improve. He has motor function in the hand. Unfortunately he continues to smoke.  Physical exam:  Filed Vitals:   08/21/15 1158  BP: 113/79  Pulse: 83  Temp: 98.4 F (36.9 C)  TempSrc: Oral  Resp: 16  Height: 5' 10.5" (1.791 m)  Weight: 168 lb 8 oz (76.431 kg)    Left upper extremity: Palpable graft pulse in the axilla brachial and adjacent to the ulnar artery incision. No palpable radialOr ulnar pulse, hand is pink and warm  Neuro: Hand intrinsics left hand are intact  Right lower extremity: All saphenectomy sites are well healed   Assessment: Doing well status post left brachial to ulnar artery bypass graft   Plan: Patient was again advised to refrain from smoking and told he has risk of losing his left upper extremity if he continues to smoke.        #2 the patient will follow-up in 6 months time for graft duplex scan.  Ruta Hinds, MD [facility] Office: [phone] Pager: [phone]

## 2015-09-16 ENCOUNTER — Ambulatory Visit (INDEPENDENT_AMBULATORY_CARE_PROVIDER_SITE_OTHER): Payer: Medicare Other | Admitting: Gastroenterology

## 2015-09-16 ENCOUNTER — Encounter (INDEPENDENT_AMBULATORY_CARE_PROVIDER_SITE_OTHER): Payer: Self-pay

## 2015-09-16 ENCOUNTER — Encounter: Payer: Self-pay | Admitting: Gastroenterology

## 2015-09-16 VITALS — BP 138/92 | HR 85 | Ht 68.5 in | Wt 162.2 lb

## 2015-09-16 DIAGNOSIS — Z720 Tobacco use: Secondary | ICD-10-CM

## 2015-09-16 DIAGNOSIS — I4821 Permanent atrial fibrillation: Secondary | ICD-10-CM

## 2015-09-16 DIAGNOSIS — I739 Peripheral vascular disease, unspecified: Secondary | ICD-10-CM

## 2015-09-16 DIAGNOSIS — I482 Chronic atrial fibrillation: Secondary | ICD-10-CM

## 2015-09-16 DIAGNOSIS — Z1211 Encounter for screening for malignant neoplasm of colon: Secondary | ICD-10-CM

## 2015-09-16 DIAGNOSIS — Z7901 Long term (current) use of anticoagulants: Secondary | ICD-10-CM

## 2015-09-16 DIAGNOSIS — I63139 Cerebral infarction due to embolism of unspecified carotid artery: Secondary | ICD-10-CM

## 2015-09-16 DIAGNOSIS — I6529 Occlusion and stenosis of unspecified carotid artery: Secondary | ICD-10-CM

## 2015-09-16 DIAGNOSIS — F17200 Nicotine dependence, unspecified, uncomplicated: Secondary | ICD-10-CM

## 2015-09-16 DIAGNOSIS — J439 Emphysema, unspecified: Secondary | ICD-10-CM

## 2015-09-16 NOTE — Patient Instructions (Addendum)
Please call us when you return from your overseas trip.  Then we will communicate with your cardiologist and neurologist about the need for you to be off coumadin several days prior to your procedure in order to then plan a colonoscopy.  If you are age 66 or older, your body mass index should be between 23-30. Your Body mass index is 24.31 kg/m. If this is out of the aforementioned range listed, please consider follow up with your Primary Care Provider.  If you are age 33 or younger, your body mass index should be between 19-25. Your Body mass index is 24.31 kg/m. If this is out of the aformentioned range listed, please consider follow up with your Primary Care Provider.   Thank you for choosing [facility]  Dr Wilfrid Lund III
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
## 2015-09-18 DIAGNOSIS — Z7901 Long term (current) use of anticoagulants: Secondary | ICD-10-CM | POA: Diagnosis not present

## 2015-09-18 DIAGNOSIS — I480 Paroxysmal atrial fibrillation: Secondary | ICD-10-CM | POA: Diagnosis not present

## 2015-09-22 ENCOUNTER — Telehealth: Payer: Self-pay | Admitting: Cardiology

## 2015-09-22 NOTE — Telephone Encounter (Signed)
Looks like pt is managed by PCP - they should be the ones prescribing warfarin and hopefully can handle this for him as I am not sure what his current dose is.

## 2015-09-22 NOTE — Telephone Encounter (Signed)
New message      Pt is in the [place] and needs his meds/warfarin sent to him at the address below.  Pt stated that he will be there for 2 months. Please advise.       *STAT* If patient is at the pharmacy, call can be transferred to refill team.   1. Which medications need to be refilled? (please list name of each medication and dose if known)  Warfarin 5mg   2. Which pharmacy/location (including street and city if local pharmacy) is medication to be sent to? [place], [place] [address]  3. Do they need a 30 day or 90 day supply? [place]

## 2015-09-22 NOTE — Telephone Encounter (Signed)
Goes to VM. Left msg to call. 

## 2015-10-24 DIAGNOSIS — L718 Other rosacea: Secondary | ICD-10-CM | POA: Diagnosis not present

## 2015-11-14 ENCOUNTER — Other Ambulatory Visit: Payer: Self-pay | Admitting: Cardiology

## 2015-11-24 DIAGNOSIS — L718 Other rosacea: Secondary | ICD-10-CM | POA: Diagnosis not present

## 2015-11-24 DIAGNOSIS — L821 Other seborrheic keratosis: Secondary | ICD-10-CM | POA: Diagnosis not present

## 2015-12-15 DIAGNOSIS — Z6824 Body mass index (BMI) 24.0-24.9, adult: Secondary | ICD-10-CM | POA: Diagnosis not present

## 2015-12-15 DIAGNOSIS — M545 Low back pain: Secondary | ICD-10-CM | POA: Diagnosis not present

## 2015-12-15 DIAGNOSIS — Z7901 Long term (current) use of anticoagulants: Secondary | ICD-10-CM | POA: Diagnosis not present

## 2015-12-15 DIAGNOSIS — I480 Paroxysmal atrial fibrillation: Secondary | ICD-10-CM | POA: Diagnosis not present

## 2015-12-15 DIAGNOSIS — I10 Essential (primary) hypertension: Secondary | ICD-10-CM | POA: Diagnosis not present

## 2015-12-18 DIAGNOSIS — L821 Other seborrheic keratosis: Secondary | ICD-10-CM | POA: Diagnosis not present

## 2015-12-18 DIAGNOSIS — L718 Other rosacea: Secondary | ICD-10-CM | POA: Diagnosis not present

## 2015-12-19 NOTE — Addendum Note (Signed)
Addended by: Mena Goes on: 12/19/2015 12:28 PM   Modules accepted: Orders

## 2016-02-23 ENCOUNTER — Encounter: Payer: Self-pay | Admitting: Vascular Surgery

## 2016-02-26 ENCOUNTER — Ambulatory Visit (HOSPITAL_COMMUNITY)
Admission: RE | Admit: 2016-02-26 | Discharge: 2016-02-26 | Disposition: A | Discharge status: Home / Self Care | Payer: Medicare Other | Source: Ambulatory Visit | Attending: Vascular Surgery | Admitting: Vascular Surgery

## 2016-02-26 ENCOUNTER — Ambulatory Visit (INDEPENDENT_AMBULATORY_CARE_PROVIDER_SITE_OTHER): Payer: Medicare Other | Admitting: Vascular Surgery

## 2016-02-26 ENCOUNTER — Encounter: Payer: Self-pay | Admitting: Vascular Surgery

## 2016-02-26 VITALS — BP 166/117 | HR 99 | Temp 98.0°F | Resp 20 | Ht 68.5 in | Wt 178.0 lb

## 2016-02-26 DIAGNOSIS — I739 Peripheral vascular disease, unspecified: Secondary | ICD-10-CM

## 2016-02-26 DIAGNOSIS — F17200 Nicotine dependence, unspecified, uncomplicated: Secondary | ICD-10-CM | POA: Diagnosis not present

## 2016-02-26 DIAGNOSIS — Z48812 Encounter for surgical aftercare following surgery on the circulatory system: Secondary | ICD-10-CM | POA: Diagnosis not present

## 2016-02-26 DIAGNOSIS — I771 Stricture of artery: Secondary | ICD-10-CM

## 2016-02-26 NOTE — Progress Notes (Signed)
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 He then underwent a left brachial to ulnar artery bypass 12/16 with right greater saphenous vein for progressive ischemia of the left hand. He returns today for follow-up. He still has some occasional numbness and tingling in his left hand but overall this has been stable. He has motor function in the hand. Unfortunately he continues to smoke. Greater than 3 minutes they spent regarding smoking cessation counseling.  He also complains of pain in both lower extremities. This does not really somewhat claudication. He complains mainly of multiple joint aches.   Physical exam:  Vitals:   02/26/16 1347 02/26/16 1349  BP: (!) 162/125 (!) 166/117  Pulse: 99   Resp: 20   Temp: 98 F (36.7 C)   TempSrc: Oral   SpO2: 98%   Weight: 178 lb (80.7 kg)   Height: 5' 8.5" (1.74 m)      Left upper extremity: Palpable graft pulse in the axilla upper brachial. No palpable radial or ulnar pulse, hand is pink and warm   Neuro: Hand intrinsics left hand are intact   Right lower extremity: All saphenectomy sites are well healed, he has 2+ femoral and 2+ dorsalis pedis pulses bilaterally  Data: Upper extremity duplex exam was performed today. I reviewed and interpreted this study. This shows that the brachial to ulnar artery bypass is occluded. The axillary bypass is patent.   Assessment: Occluded left brachial to ulnar artery bypass graft. He has a patent axillary bypass graft. His hand is well perfused. He has minimal symptoms.   Plan: Patient was again advised to refrain from smoking and told he has risk of losing his left upper extremity if he continues to smoke.        #2 the patient will follow-up in 12 months time with our nurse practitioner for left upper extremity duplex scan. He is not a candidate for further left upper extremity interventions from the elbow down. He  would potentially be a candidate for revision of the axillary graft if there was a problem with this.   Ruta Hinds, MD [facility] Office: [phone] Pager: [phone]

## 2016-02-26 NOTE — Addendum Note (Signed)
Addended by: Lianne Cure A on: 02/26/2016 03:44 PM   Modules accepted: Orders

## 2016-02-27 DIAGNOSIS — L711 Rhinophyma: Secondary | ICD-10-CM | POA: Diagnosis not present

## 2016-02-27 DIAGNOSIS — L718 Other rosacea: Secondary | ICD-10-CM | POA: Diagnosis not present

## 2016-03-17 DIAGNOSIS — E784 Other hyperlipidemia: Secondary | ICD-10-CM | POA: Diagnosis not present

## 2016-03-17 DIAGNOSIS — I10 Essential (primary) hypertension: Secondary | ICD-10-CM | POA: Diagnosis not present

## 2016-03-17 DIAGNOSIS — R8299 Other abnormal findings in urine: Secondary | ICD-10-CM | POA: Diagnosis not present

## 2016-03-17 DIAGNOSIS — Z125 Encounter for screening for malignant neoplasm of prostate: Secondary | ICD-10-CM | POA: Diagnosis not present

## 2016-03-23 ENCOUNTER — Other Ambulatory Visit: Payer: Self-pay | Admitting: Internal Medicine

## 2016-03-23 DIAGNOSIS — R911 Solitary pulmonary nodule: Secondary | ICD-10-CM

## 2016-03-23 DIAGNOSIS — L711 Rhinophyma: Secondary | ICD-10-CM | POA: Diagnosis not present

## 2016-03-24 ENCOUNTER — Other Ambulatory Visit: Payer: Self-pay

## 2016-03-25 ENCOUNTER — Other Ambulatory Visit: Payer: Self-pay | Admitting: Internal Medicine

## 2016-03-25 DIAGNOSIS — R911 Solitary pulmonary nodule: Secondary | ICD-10-CM

## 2016-03-26 ENCOUNTER — Ambulatory Visit
Admission: RE | Admit: 2016-03-26 | Discharge: 2016-03-26 | Disposition: A | Discharge status: Home / Self Care | Payer: Medicare Other | Source: Ambulatory Visit | Attending: Internal Medicine | Admitting: Internal Medicine

## 2016-03-26 DIAGNOSIS — R911 Solitary pulmonary nodule: Secondary | ICD-10-CM

## 2016-03-26 DIAGNOSIS — R918 Other nonspecific abnormal finding of lung field: Secondary | ICD-10-CM | POA: Diagnosis not present

## 2016-04-05 DIAGNOSIS — Z1212 Encounter for screening for malignant neoplasm of rectum: Secondary | ICD-10-CM | POA: Diagnosis not present

## 2016-04-15 DIAGNOSIS — Q825 Congenital non-neoplastic nevus: Secondary | ICD-10-CM | POA: Diagnosis not present

## 2016-04-15 DIAGNOSIS — D485 Neoplasm of uncertain behavior of skin: Secondary | ICD-10-CM | POA: Diagnosis not present

## 2016-05-05 DIAGNOSIS — Z7901 Long term (current) use of anticoagulants: Secondary | ICD-10-CM | POA: Diagnosis not present

## 2016-05-05 DIAGNOSIS — R600 Localized edema: Secondary | ICD-10-CM | POA: Diagnosis not present

## 2016-05-05 DIAGNOSIS — Z6826 Body mass index (BMI) 26.0-26.9, adult: Secondary | ICD-10-CM | POA: Diagnosis not present

## 2016-05-05 DIAGNOSIS — G4700 Insomnia, unspecified: Secondary | ICD-10-CM | POA: Diagnosis not present

## 2016-05-05 DIAGNOSIS — I480 Paroxysmal atrial fibrillation: Secondary | ICD-10-CM | POA: Diagnosis not present

## 2016-05-05 DIAGNOSIS — J449 Chronic obstructive pulmonary disease, unspecified: Secondary | ICD-10-CM | POA: Diagnosis not present

## 2016-05-21 DIAGNOSIS — I480 Paroxysmal atrial fibrillation: Secondary | ICD-10-CM | POA: Diagnosis not present

## 2016-05-21 DIAGNOSIS — I10 Essential (primary) hypertension: Secondary | ICD-10-CM | POA: Diagnosis not present

## 2016-05-21 DIAGNOSIS — J449 Chronic obstructive pulmonary disease, unspecified: Secondary | ICD-10-CM | POA: Diagnosis not present

## 2016-05-21 DIAGNOSIS — R600 Localized edema: Secondary | ICD-10-CM | POA: Diagnosis not present

## 2016-05-21 DIAGNOSIS — I721 Aneurysm of artery of upper extremity: Secondary | ICD-10-CM | POA: Diagnosis not present

## 2016-05-21 DIAGNOSIS — Z7901 Long term (current) use of anticoagulants: Secondary | ICD-10-CM | POA: Diagnosis not present

## 2016-05-21 DIAGNOSIS — G4709 Other insomnia: Secondary | ICD-10-CM | POA: Diagnosis not present

## 2016-05-21 DIAGNOSIS — F17200 Nicotine dependence, unspecified, uncomplicated: Secondary | ICD-10-CM | POA: Diagnosis not present

## 2016-05-21 DIAGNOSIS — Z6825 Body mass index (BMI) 25.0-25.9, adult: Secondary | ICD-10-CM | POA: Diagnosis not present

## 2016-06-01 DIAGNOSIS — R310 Gross hematuria: Secondary | ICD-10-CM | POA: Diagnosis not present

## 2016-06-01 DIAGNOSIS — R358 Other polyuria: Secondary | ICD-10-CM | POA: Diagnosis not present

## 2016-06-01 DIAGNOSIS — I480 Paroxysmal atrial fibrillation: Secondary | ICD-10-CM | POA: Diagnosis not present

## 2016-06-01 DIAGNOSIS — Z6825 Body mass index (BMI) 25.0-25.9, adult: Secondary | ICD-10-CM | POA: Diagnosis not present

## 2016-06-01 DIAGNOSIS — I10 Essential (primary) hypertension: Secondary | ICD-10-CM | POA: Diagnosis not present

## 2016-06-01 DIAGNOSIS — Z7901 Long term (current) use of anticoagulants: Secondary | ICD-10-CM | POA: Diagnosis not present

## 2016-06-01 DIAGNOSIS — J449 Chronic obstructive pulmonary disease, unspecified: Secondary | ICD-10-CM | POA: Diagnosis not present

## 2016-06-01 DIAGNOSIS — F17200 Nicotine dependence, unspecified, uncomplicated: Secondary | ICD-10-CM | POA: Diagnosis not present

## 2016-06-02 ENCOUNTER — Other Ambulatory Visit: Payer: Self-pay | Admitting: Internal Medicine

## 2016-06-02 DIAGNOSIS — R310 Gross hematuria: Secondary | ICD-10-CM

## 2016-06-03 DIAGNOSIS — Z6825 Body mass index (BMI) 25.0-25.9, adult: Secondary | ICD-10-CM | POA: Diagnosis not present

## 2016-06-03 DIAGNOSIS — R310 Gross hematuria: Secondary | ICD-10-CM | POA: Diagnosis not present

## 2016-06-03 DIAGNOSIS — Z7901 Long term (current) use of anticoagulants: Secondary | ICD-10-CM | POA: Diagnosis not present

## 2016-06-07 ENCOUNTER — Ambulatory Visit
Admission: RE | Admit: 2016-06-07 | Discharge: 2016-06-07 | Disposition: A | Discharge status: Home / Self Care | Payer: Medicare Other | Source: Ambulatory Visit | Attending: Internal Medicine | Admitting: Internal Medicine

## 2016-06-07 ENCOUNTER — Ambulatory Visit: Payer: Medicare Other | Admitting: Nurse Practitioner

## 2016-06-07 DIAGNOSIS — R310 Gross hematuria: Secondary | ICD-10-CM

## 2016-06-07 DIAGNOSIS — N132 Hydronephrosis with renal and ureteral calculous obstruction: Secondary | ICD-10-CM | POA: Diagnosis not present

## 2016-06-10 DIAGNOSIS — R310 Gross hematuria: Secondary | ICD-10-CM | POA: Diagnosis not present

## 2016-06-10 DIAGNOSIS — N200 Calculus of kidney: Secondary | ICD-10-CM | POA: Diagnosis not present

## 2016-06-10 DIAGNOSIS — I480 Paroxysmal atrial fibrillation: Secondary | ICD-10-CM | POA: Diagnosis not present

## 2016-06-10 DIAGNOSIS — Z7901 Long term (current) use of anticoagulants: Secondary | ICD-10-CM | POA: Diagnosis not present

## 2016-06-11 ENCOUNTER — Other Ambulatory Visit: Payer: Self-pay | Admitting: Cardiology

## 2016-06-11 DIAGNOSIS — N201 Calculus of ureter: Secondary | ICD-10-CM | POA: Diagnosis not present

## 2016-07-01 DIAGNOSIS — N201 Calculus of ureter: Secondary | ICD-10-CM | POA: Diagnosis not present

## 2016-07-01 DIAGNOSIS — N23 Unspecified renal colic: Secondary | ICD-10-CM | POA: Diagnosis not present

## 2016-07-02 ENCOUNTER — Other Ambulatory Visit: Payer: Self-pay | Admitting: Urology

## 2016-07-06 ENCOUNTER — Ambulatory Visit: Payer: Medicare Other | Admitting: Nurse Practitioner

## 2016-07-06 ENCOUNTER — Encounter (HOSPITAL_BASED_OUTPATIENT_CLINIC_OR_DEPARTMENT_OTHER): Payer: Self-pay | Admitting: *Deleted

## 2016-07-07 ENCOUNTER — Encounter (INDEPENDENT_AMBULATORY_CARE_PROVIDER_SITE_OTHER): Payer: Self-pay

## 2016-07-07 ENCOUNTER — Ambulatory Visit (INDEPENDENT_AMBULATORY_CARE_PROVIDER_SITE_OTHER): Payer: Medicare Other | Admitting: Nurse Practitioner

## 2016-07-07 ENCOUNTER — Encounter: Payer: Self-pay | Admitting: Nurse Practitioner

## 2016-07-07 VITALS — BP 127/82 | HR 64 | Ht 68.5 in | Wt 172.8 lb

## 2016-07-07 DIAGNOSIS — E785 Hyperlipidemia, unspecified: Secondary | ICD-10-CM | POA: Insufficient documentation

## 2016-07-07 DIAGNOSIS — F17200 Nicotine dependence, unspecified, uncomplicated: Secondary | ICD-10-CM

## 2016-07-07 DIAGNOSIS — I63139 Cerebral infarction due to embolism of unspecified carotid artery: Secondary | ICD-10-CM | POA: Diagnosis not present

## 2016-07-07 DIAGNOSIS — I10 Essential (primary) hypertension: Secondary | ICD-10-CM | POA: Diagnosis not present
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
## 2016-07-07 NOTE — Patient Instructions (Addendum)
Continued management of stroke risk factors  Continue warfarin daily  for secondary stroke prevention  Maintain strict control of hypertension with blood pressure goal below 130/90 today's reading 127/87  Strict control of  lipids with LDL cholesterol goal below 70 mg/dL continue Lipitor.  Stop  Smoking Healthy diet with plenty of whole grains, cereals, fruits and vegetables,  exercise regularly  walking and maintain ideal body Discharge from stroke clinic    Stroke Prevention Some health problems and behaviors may make it more likely for you to have a stroke. Below are ways to lessen your risk of having a stroke.  Be active for at least 30 minutes on most or all days.  Do not smoke. Try not to be around others who smoke.  Do not drink too much alcohol.  Do not have more than 2 drinks a day if you are a man.  Do not have more than 1 drink a day if you are a woman and are not pregnant.  Eat healthy foods, such as fruits and vegetables. If you were put on a specific diet, follow the diet as told.  Keep your cholesterol levels under control through diet and medicines. Look for foods that are low in saturated fat, trans fat, cholesterol, and are high in fiber.  If you have diabetes, follow all diet plans and take your medicine as told.  Ask your doctor if you need treatment to lower your blood pressure. If you have high blood pressure (hypertension), follow all diet plans and take your medicine as told by your doctor.  If you are 52-8 years old, have your blood pressure checked every 3-5 years. If you are age 35 or older, have your blood pressure checked every year.  Keep a healthy weight. Eat foods that are low in calories, salt, saturated fat, trans fat, and cholesterol.  Do not take drugs.  Avoid birth control pills, if this applies. Talk to your doctor about the risks of taking birth control pills.  Talk to your doctor if you have sleep problems (sleep apnea).  Take all  medicine as told by your doctor.  You may be told to take aspirin or blood thinner medicine. Take this medicine as told by your doctor.  Understand your medicine instructions.  Make sure any other conditions you have are being taken care of. Get help right away if:  You suddenly lose feeling (you feel numb) or have weakness in your face, arm, or leg.  Your face or eyelid hangs down to one side.  You suddenly feel confused.  You have trouble talking (aphasia) or understanding what people are saying.  You suddenly have trouble seeing in one or both eyes.  You suddenly have trouble walking.  You are dizzy.  You lose your balance or your movements are clumsy (uncoordinated).  You suddenly have a very bad headache and you do not know the cause.  You have new chest pain.  Your heart feels like it is fluttering or skipping a beat (irregular heartbeat). Do not wait to see if the symptoms above go away. Get help right away. Call your local emergency services (911 in [place] Do not drive yourself to the hospital. This information is not intended to replace advice given to you by your health care provider. Make sure you discuss any questions you have with your health care provider. Document Released: 07/27/2011 Document Revised: 07/03/2015 Document Reviewed: 07/28/2012 Elsevier Interactive Patient Education  2017 [employer].

## 2016-07-07 NOTE — Progress Notes (Signed)
I have read the note, and I agree with the clinical assessment and plan.  WILLIS,CHARLES KEITH   

## 2016-07-08 ENCOUNTER — Encounter (HOSPITAL_BASED_OUTPATIENT_CLINIC_OR_DEPARTMENT_OTHER): Payer: Self-pay | Admitting: *Deleted

## 2016-07-08 NOTE — Progress Notes (Addendum)
NPO AFTER MN.  ARRIVE AT 1000.  NEEDS ISTAT 8, KUB,  AND EKG.  WILL TAKE METOPROLOL AND DILTIAZEM AM DOS W/ SIPS OF WATER.  PER PT WAS TOLD FROM OFFICE TO CONTINUE COUMADIN.  LAST INR DONE AT NEUROLOGIST OFFICE PER NOTE 07-07-2016 IN EPIC , 1.85.

## 2016-07-10 IMAGING — CT CT CHEST W/O CM
2 of 5 series · 15 of 36 positions shown, 18 images · non-contrast
Comparison: 08/08/2014

CLINICAL DATA: Pulmonary nodule, followup, history hypertension,
COPD, atrial fibrillation, stroke, former smoker

EXAM:
CT CHEST WITHOUT CONTRAST
TECHNIQUE: Multidetector CT imaging of the chest was performed following the
standard protocol without IV contrast. Sagittal and coronal MPR
images reconstructed from axial data set.

[Series 5: cor · coronal · 0.68mm/px · 3 of 98 slices shown]
[im 20/98  lung]
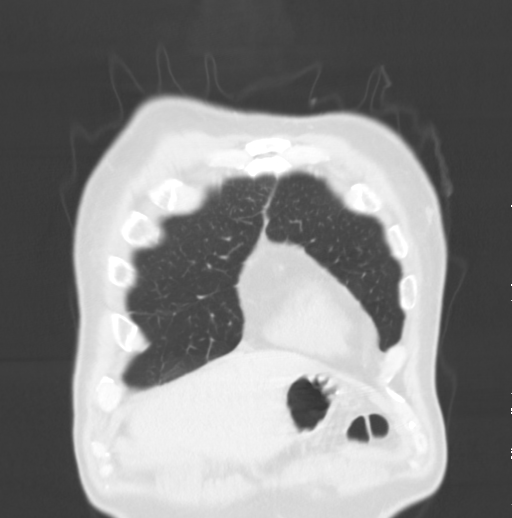
[im 39/98  lung]
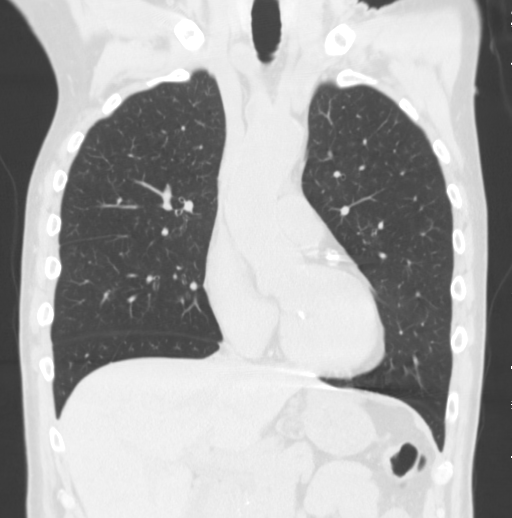
[im 59/98  lung]
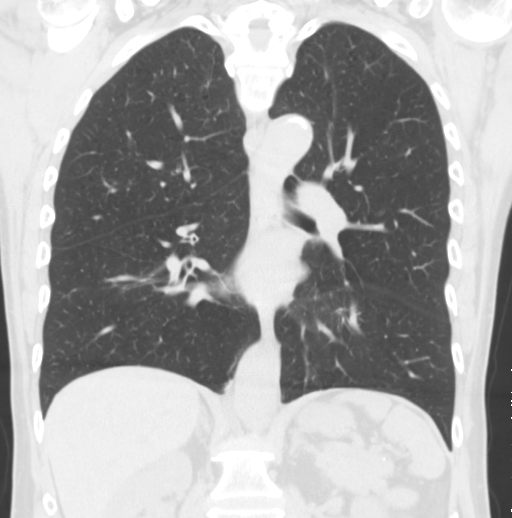

[Series 7: super d · axial · 0.69mm/px · z∈[+1237,+1551]mm · 12 of 352 slices shown, 15 images]
[im 19/352  mediastinal]
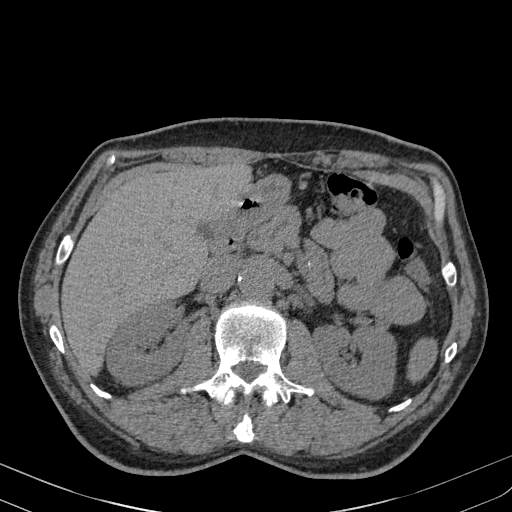
[im 19/352  lung]
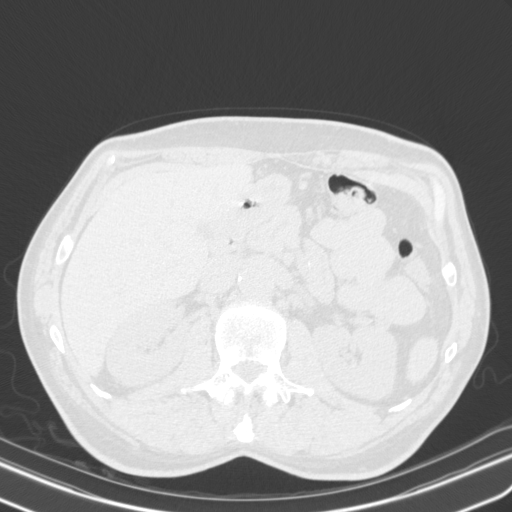
[im 56/352  lung]
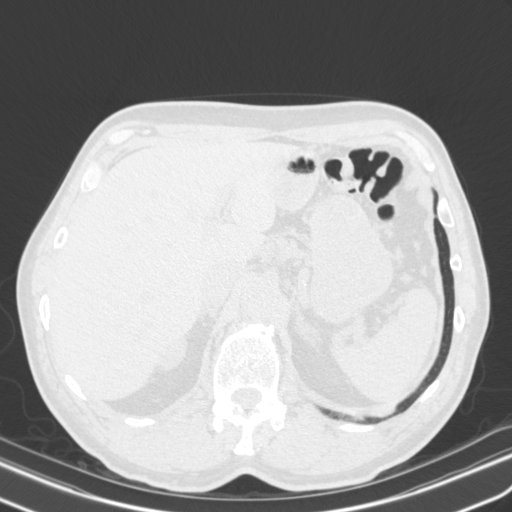
[im 74/352  lung]
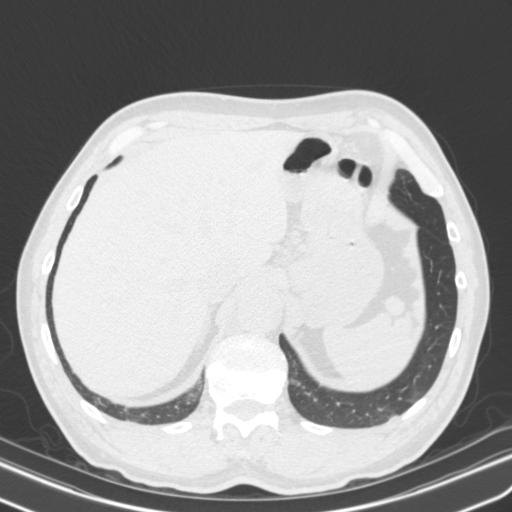
[im 111/352  lung]
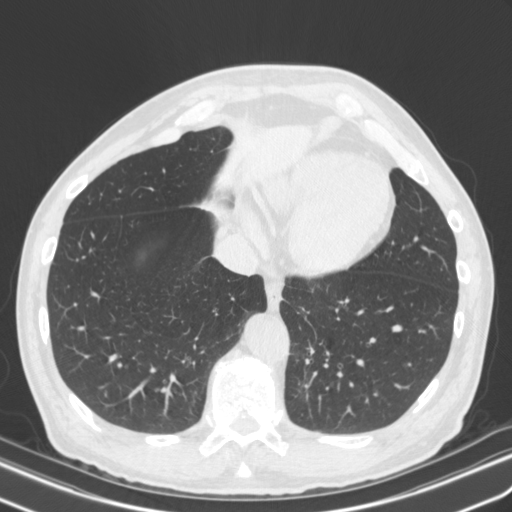
[im 130/352  mediastinal]
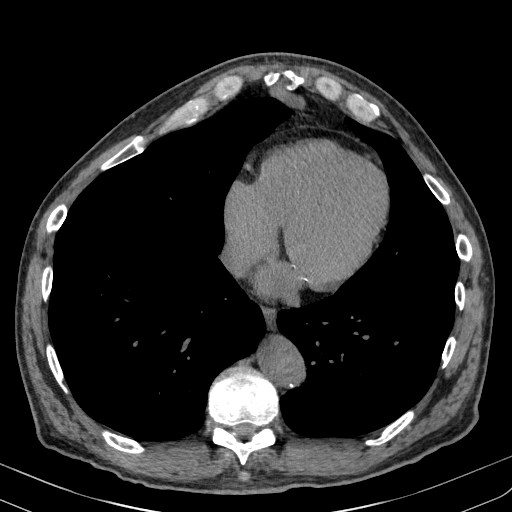
[im 130/352  lung]
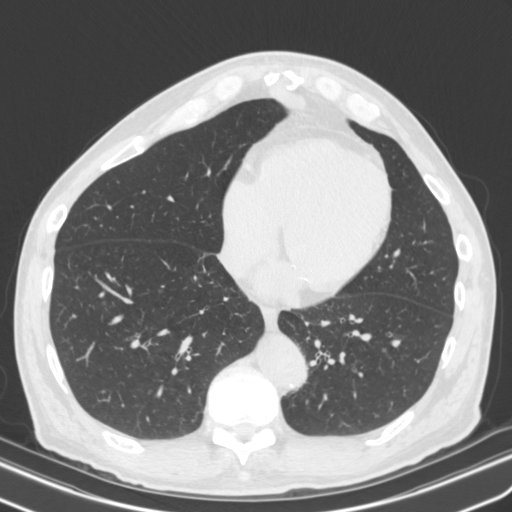
[im 167/352  lung]
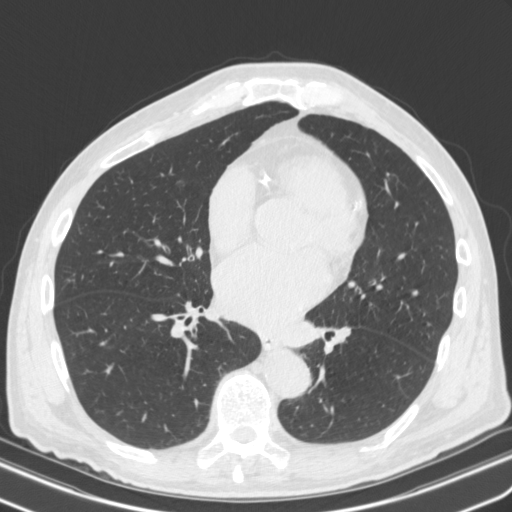
[im 185/352  lung]
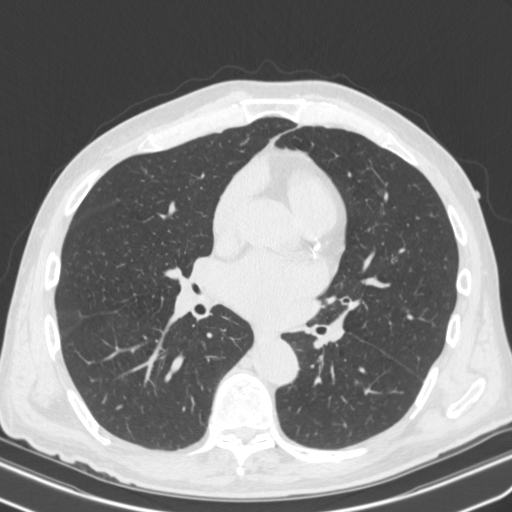
[im 222/352  lung]
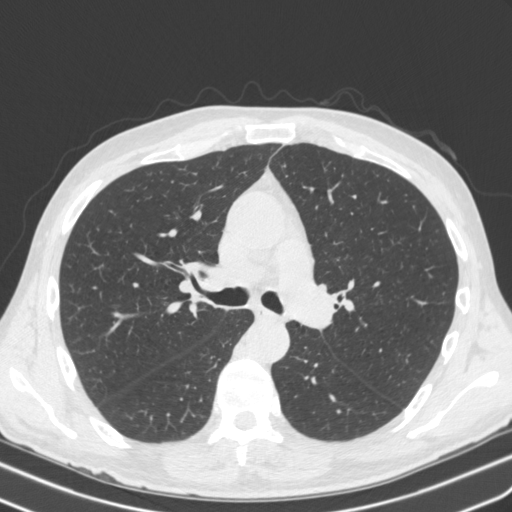
[im 241/352  mediastinal]
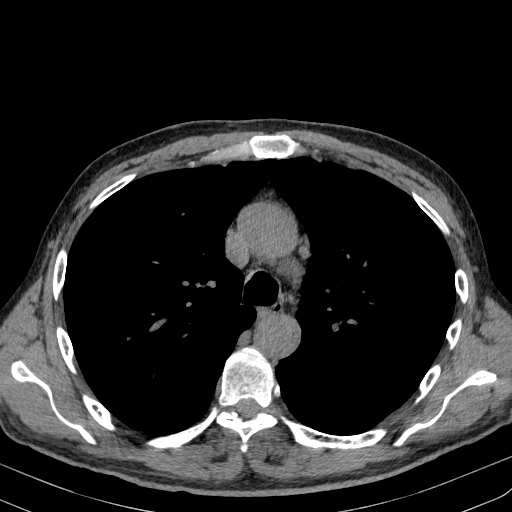
[im 241/352  lung]
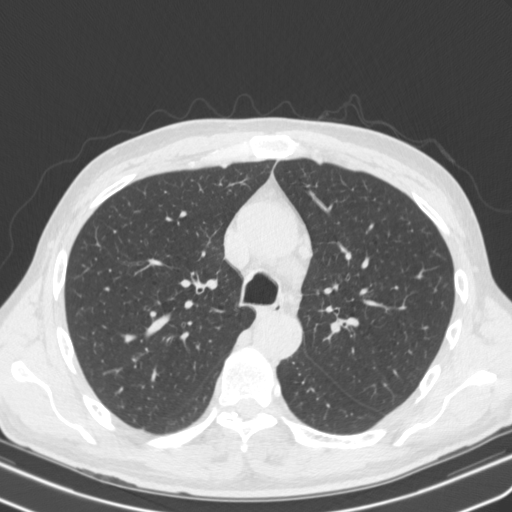
[im 278/352  lung]
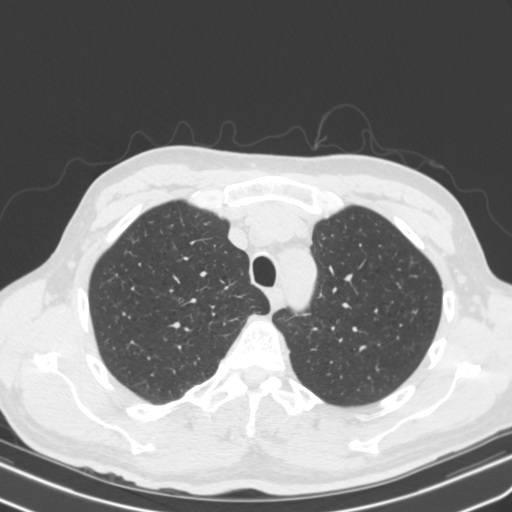
[im 296/352  lung]
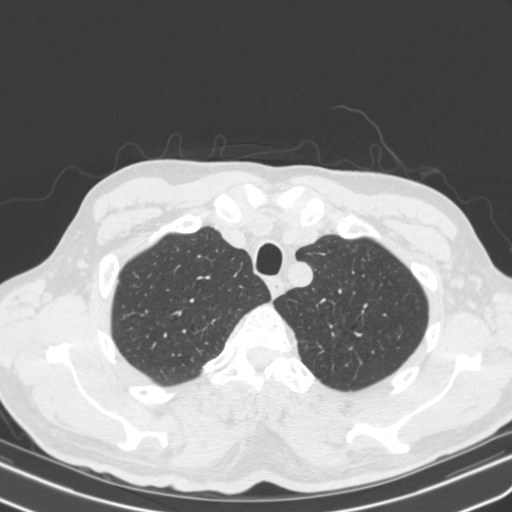
[im 333/352  lung]
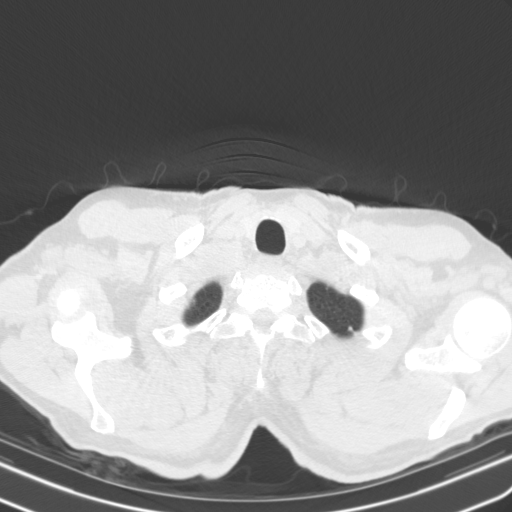

[15 of 36 positions shown; findings below may reference images not displayed]

FINDINGS: Atherosclerotic calcifications aorta and coronary arteries.

Upper normal caliber ascending thoracic aorta 3.9 cm transverse
image 27.

No definite thoracic adenopathy.

Visualized upper abdomen unremarkable.

Diffuse emphysematous changes greatest at apices.

Small focus of subtle ground-glass opacity in the RIGHT upper lobe
image 25.

Areas of questionable nodularity at RIGHT apex no longer identified.

No acute pulmonary infiltrate, pleural effusion, pneumothorax or
additional mass/nodule.

Bones unremarkable.
IMPRESSION: Resolution of previously identified RIGHT apical nodules.

Subtle focus of ground-glass opacity in the RIGHT upper lobe, of
uncertain etiology, recommendation below.

Follow up by CT is recommended in 12 months, with continued annual
surveillance for a minimum of 3 years.These recommendations are
taken from:Recommendations for the Management of Subsolid Pulmonary
Nodules Detected at CT: A Statement from the [HOSPITAL]

## 2016-07-12 NOTE — H&P (Signed)
CC: I have a ureteral stone.  HPI: [name] is a 66 year-old male established patient who is here for a right ureteral calculus.  The patient's stone is on his right side. He first noticed the symptoms 05/31/2016. This is his first kidney stone. There is not a history of calculus disease in the family. He is currently having flank pain and back pain. He denies having groin pain, nausea, vomiting, fever, and chills. He does have a burning sensation when he urinates. He has not caught a stone in his urine strainer since his symptoms began.   He has never had surgical treatment for calculi in the past.   06/11/16: He was seen on 06/01/16 having begun to experience gross hematuria as well as some pain in the back on the right-hand side. He was on Coumadin which was held. His urine eventually cleared. A CT scan was obtained on 06/07/16 which revealed a 4 mm stone in the proximal right ureter with no hydronephrosis and no other renal calculi present. He never had a stone before. He continues to have some slight discomfort in the right flank but is not severe. It has been controlled by tramadol. 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 He has no prior history of stones. His urine is currently clear and he is back on his Coumadin.   Interval history 07/01/16: A KUB on 06/11/16 revealed no definite stones seen along the course of the ureter but the patient remains symptomatic. He wanted to pursue medical expulsive therapy and therefore was maintained on alpha blockade therapy.   Interval history 07/01/16: He has been having continued intermittent pain. He has not seen his stone pass.     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 These options include observation, ureteroscopy, shockwave lithotripsy, and PCNL. We discussed which options are relevant to these particular stones. We discussed the natural history of stones as well as the complications of untreated stones and the impact on quality of life without treatment as well as with each of the above listed treatments. We also discussed the efficacy of each treatment in its ability to clear the stone burden. With any of these management options I discussed the signs and symptoms of infection and the need for emergent treatment should these be experienced. For each option we discussed the ability of each procedure to clear the patient of their stone burden. For observation I described the risks which include but are not limited to silent renal damage, life-threatening infection, need for emergent surgery, failure to pass stone, and pain. For ureteroscopy I described the risks which include heart attack, stroke, pulmonary embolus, death, bleeding, infection, damage to contiguous structures, positioning injury, ureteral stricture, ureteral avulsion, ureteral injury, need for ureteral stent, inability to  perform ureteroscopy, need for an interval procedure, inability to clear stone burden, stent discomfort and pain. For shockwave lithotripsy I described the risks which include arrhythmia, kidney contusion, kidney hemorrhage, need for transfusion, long-term risk of diabetes or hypertension, back discomfort, flank ecchymosis, flank abrasion, inability to  break up stone, inability to pass stone fragments, Steinstrasse, infection associated with obstructing stones, need for different surgical procedure and possible need for repeat shockwave lithotripsy. - 06/11/2016    NON-GU PMH: Anxiety Arthritis Depression GERD Heart disease, unspecified Hypertension Stroke/TIA    FAMILY HISTORY: Death of family member - Mother, Father Gastric Cancer - Father Lung Cancer - Mother   SOCIAL HISTORY: Marital Status: Divorced Current Smoking Status: Patient smokes. Has smoked since 12/03/2012.   Tobacco Use Assessment Completed: Used Tobacco in last 30 days? Does drink.  Does not drink caffeine.     Notes: occasional caffeine use(not daily)   REVIEW OF SYSTEMS:    GU Review Male:   Patient reports frequent urination, hard to postpone urination, burning/ pain with urination, get up at night to urinate, leakage of urine, stream starts and stops, and erection problems. Patient denies trouble starting your stream, have to strain to urinate , and penile pain.  Gastrointestinal (Upper):   Patient denies nausea, vomiting, and indigestion/ heartburn.  Gastrointestinal (Lower):   Patient denies diarrhea and constipation.  Constitutional:   Patient reports night sweats and weight loss. Patient denies fever and fatigue.  Skin:   Patient reports itching. Patient denies skin rash/ lesion.  Eyes:   Patient reports double vision. Patient denies blurred vision.  Ears/ Nose/ Throat:   Patient denies sore throat and sinus problems.  Hematologic/Lymphatic:   Patient reports easy bruising. Patient denies swollen glands.  Cardiovascular:   Patient denies leg swelling and chest pains.  Respiratory:   Patient denies cough and shortness of breath.  Endocrine:   Patient denies excessive thirst.  Musculoskeletal:   Patient reports back pain and joint pain.   Neurological:   Patient reports headaches and dizziness.   Psychologic:   Patient reports depression. Patient denies  anxiety.   VITAL SIGNS:    Weight 168 lb / 76.2 kg  BP 130/91 mmHg  Pulse 72 /min  Temperature 97.5 F / 36 C   MULTI-SYSTEM PHYSICAL EXAMINATION:    Constitutional: Thin. No physical deformities. Normally developed. Good grooming.   Neck: Neck symmetrical, not swollen. Normal tracheal position.  Respiratory: No labored breathing, no use of accessory muscles.   Cardiovascular: Normal temperature, normal extremity pulses, no swelling, no varicosities.  Lymphatic: No enlargement of neck, axillae, groin.  Skin: No paleness, no jaundice, no cyanosis. No lesion, no ulcer, no rash.  Neurologic / Psychiatric: Oriented to time, oriented to place, oriented to person. No depression, no anxiety, no agitation.  Gastrointestinal: No mass, no tenderness, no rigidity, non obese abdomen.  Eyes: Normal conjunctivae. Normal eyelids.  Ears, Nose, Mouth, and Throat: Left ear no scars, no lesions, no masses. Right ear no scars, no lesions, no masses. Nose no scars, no lesions, no masses. Normal hearing. Normal lips.  Musculoskeletal: Normal gait and station of head and neck. Mild kyphosis    PAST DATA REVIEWED:  Source Of History:  Patient  Lab Test Review:   PSA, BUN/Creatinine, Calcium  Records Review:   Previous Patient Records, POC Tool  X-Ray Review: KUB: Reviewed Films. His previous KUB was compared with today's image. C.T. Abdomen/Pelvis: Reviewed Films. Previous CT scan images were compared with today's KUB.  03/17/16  PSA  Total PSA 1.71 ng/dl   Notes:                     He had a PSA done in 2/18 Was normal at 1.71 and his creatinine in 4/18 was 1.0. He has had a serum calcium done in 4/18 and this was 9.9.   PROCEDURES:         KUB - [id number]  A single view of the abdomen is obtained.      His stone is visible on today's KUB at the level of the L4 transverse process on the right-hand side.         Urinalysis w/Scope Dipstick Dipstick Cont'd Micro  Color: Yellow Bilirubin: Neg  WBC/hpf: NS (Not Seen)  Appearance: Clear Ketones: Neg RBC/hpf: 3 - 10/hpf  Specific Gravity: 1.025 Blood: 2+ Bacteria: NS (Not Seen)  pH: 6.0 Protein: Trace Cystals: Ca Oxalate  Glucose: Neg Urobilinogen: 0.2 Casts: NS (Not Seen)    Nitrites: Neg Trichomonas: Not Present    Leukocyte Esterase: Neg Mucous: Not Present      Epithelial Cells: 0 - 5/hpf      Yeast: NS (Not Seen)      Sperm: Not Present    ASSESSMENT/PLAN:      ICD-10 Details  1 GU:   Ureteral calculus - N20.1 Stable - He remains symptomatic and would like to proceed with treatment of his stone. It is not regressed. It is likely going to be too faint for lithotripsy and therefore I recommended ureteroscopic management. I went over the procedure with him in detail including its risks and Patient's, the alternatives, the probability of success, the outpatient nature of the procedure as well as the anticipated postoperative course. He understands and is elected to proceed.  2   Renal colic - F75 Stable - He has been experiencing renal colic secondary to his right ureteral stone which is not progressing. He is therefore interested in proceeding with surgical intervention.              Notes:   He has had several teeth pulled and also had several surgeries on his left upper extremity and did not have to stop his Coumadin that he takes for atrial fibrillation. I told him there is a slightly increased risk of bleeding complications following this procedure with him on Coumadin but I told him he could remain on that and we would then proceed with surgery.    Addendum: In the preop holding area the mandatory lessons regarding whether the patient had thoughts of harming himself were asked and he answered that in fact he has thought of harming himself. When asked when he thought this most recently he said last night. He seems to have had multiple life stressors that have occurred and this appears to of resulted in his being very angry with the  world and has resulted in his considering harming himself. I have discussed the fact that having shared this information with myself and the other staff members I am obligated to ask that he be evaluated by psychiatry and in addition will need to be admitted to the hospital for suicide precautions.

## 2016-07-13 ENCOUNTER — Ambulatory Visit (HOSPITAL_COMMUNITY): Payer: Medicare Other

## 2016-07-13 ENCOUNTER — Ambulatory Visit (HOSPITAL_BASED_OUTPATIENT_CLINIC_OR_DEPARTMENT_OTHER): Payer: Medicare Other | Admitting: Anesthesiology

## 2016-07-13 ENCOUNTER — Encounter (HOSPITAL_BASED_OUTPATIENT_CLINIC_OR_DEPARTMENT_OTHER)
Admission: RE | Disposition: A | Discharge status: Other Acute Inpatient Hospital | Payer: Self-pay | Source: Ambulatory Visit | Attending: Urology

## 2016-07-13 ENCOUNTER — Encounter (HOSPITAL_COMMUNITY): Payer: Self-pay | Admitting: Emergency Medicine

## 2016-07-13 ENCOUNTER — Emergency Department (HOSPITAL_COMMUNITY)
Admission: EM | Admit: 2016-07-13 | Discharge: 2016-07-14 | Disposition: A | Discharge status: Home / Self Care | Payer: Medicare Other | Attending: Emergency Medicine | Admitting: Emergency Medicine

## 2016-07-13 ENCOUNTER — Encounter (HOSPITAL_BASED_OUTPATIENT_CLINIC_OR_DEPARTMENT_OTHER): Payer: Self-pay | Admitting: Anesthesiology

## 2016-07-13 ENCOUNTER — Ambulatory Visit (HOSPITAL_BASED_OUTPATIENT_CLINIC_OR_DEPARTMENT_OTHER)
Admission: RE | Admit: 2016-07-13 | Discharge: 2016-07-13 | Disposition: A | Discharge status: Other Acute Inpatient Hospital | Payer: Medicare Other | Source: Ambulatory Visit | Attending: Urology | Admitting: Urology

## 2016-07-13 DIAGNOSIS — R45851 Suicidal ideations: Secondary | ICD-10-CM

## 2016-07-13 DIAGNOSIS — Q625 Duplication of ureter: Secondary | ICD-10-CM | POA: Insufficient documentation

## 2016-07-13 DIAGNOSIS — Z7901 Long term (current) use of anticoagulants: Secondary | ICD-10-CM

## 2016-07-13 DIAGNOSIS — R000 Tachycardia, unspecified: Secondary | ICD-10-CM | POA: Diagnosis not present

## 2016-07-13 DIAGNOSIS — F4329 Adjustment disorder with other symptoms: Secondary | ICD-10-CM | POA: Diagnosis not present

## 2016-07-13 DIAGNOSIS — J441 Chronic obstructive pulmonary disease with (acute) exacerbation: Secondary | ICD-10-CM | POA: Insufficient documentation

## 2016-07-13 DIAGNOSIS — N201 Calculus of ureter: Secondary | ICD-10-CM | POA: Insufficient documentation

## 2016-07-13 DIAGNOSIS — E785 Hyperlipidemia, unspecified: Secondary | ICD-10-CM | POA: Diagnosis present

## 2016-07-13 DIAGNOSIS — Z8673 Personal history of transient ischemic attack (TIA), and cerebral infarction without residual deficits: Secondary | ICD-10-CM

## 2016-07-13 DIAGNOSIS — F4322 Adjustment disorder with anxiety: Secondary | ICD-10-CM | POA: Diagnosis present

## 2016-07-13 DIAGNOSIS — R079 Chest pain, unspecified: Secondary | ICD-10-CM | POA: Diagnosis not present

## 2016-07-13 DIAGNOSIS — N179 Acute kidney failure, unspecified: Secondary | ICD-10-CM | POA: Diagnosis not present

## 2016-07-13 DIAGNOSIS — Z79899 Other long term (current) drug therapy: Secondary | ICD-10-CM | POA: Insufficient documentation

## 2016-07-13 DIAGNOSIS — F329 Major depressive disorder, single episode, unspecified: Secondary | ICD-10-CM | POA: Diagnosis not present

## 2016-07-13 DIAGNOSIS — D72828 Other elevated white blood cell count: Secondary | ICD-10-CM | POA: Diagnosis not present

## 2016-07-13 DIAGNOSIS — Z800 Family history of malignant neoplasm of digestive organs: Secondary | ICD-10-CM

## 2016-07-13 DIAGNOSIS — J449 Chronic obstructive pulmonary disease, unspecified: Secondary | ICD-10-CM | POA: Diagnosis present

## 2016-07-13 DIAGNOSIS — R45850 Homicidal ideations: Secondary | ICD-10-CM | POA: Diagnosis not present

## 2016-07-13 DIAGNOSIS — Z87442 Personal history of urinary calculi: Secondary | ICD-10-CM | POA: Diagnosis not present

## 2016-07-13 DIAGNOSIS — N132 Hydronephrosis with renal and ureteral calculous obstruction: Secondary | ICD-10-CM | POA: Diagnosis not present

## 2016-07-13 DIAGNOSIS — Z711 Person with feared health complaint in whom no diagnosis is made: Secondary | ICD-10-CM | POA: Diagnosis not present

## 2016-07-13 DIAGNOSIS — F17210 Nicotine dependence, cigarettes, uncomplicated: Secondary | ICD-10-CM | POA: Diagnosis not present

## 2016-07-13 DIAGNOSIS — K219 Gastro-esophageal reflux disease without esophagitis: Secondary | ICD-10-CM

## 2016-07-13 DIAGNOSIS — Z801 Family history of malignant neoplasm of trachea, bronchus and lung: Secondary | ICD-10-CM | POA: Diagnosis not present

## 2016-07-13 DIAGNOSIS — I10 Essential (primary) hypertension: Secondary | ICD-10-CM | POA: Diagnosis not present

## 2016-07-13 DIAGNOSIS — R109 Unspecified abdominal pain: Secondary | ICD-10-CM | POA: Insufficient documentation

## 2016-07-13 DIAGNOSIS — R0602 Shortness of breath: Secondary | ICD-10-CM | POA: Diagnosis not present

## 2016-07-13 DIAGNOSIS — I739 Peripheral vascular disease, unspecified: Secondary | ICD-10-CM | POA: Insufficient documentation

## 2016-07-13 DIAGNOSIS — Z049 Encounter for examination and observation for unspecified reason: Secondary | ICD-10-CM

## 2016-07-13 DIAGNOSIS — Z8249 Family history of ischemic heart disease and other diseases of the circulatory system: Secondary | ICD-10-CM

## 2016-07-13 DIAGNOSIS — F17200 Nicotine dependence, unspecified, uncomplicated: Secondary | ICD-10-CM | POA: Insufficient documentation

## 2016-07-13 DIAGNOSIS — R4589 Other symptoms and signs involving emotional state: Secondary | ICD-10-CM | POA: Diagnosis not present

## 2016-07-13 DIAGNOSIS — I4891 Unspecified atrial fibrillation: Secondary | ICD-10-CM | POA: Diagnosis not present

## 2016-07-13 DIAGNOSIS — R1031 Right lower quadrant pain: Secondary | ICD-10-CM | POA: Diagnosis not present

## 2016-07-13 HISTORY — DX: Other complications of anesthesia, initial encounter: T88.59XA

## 2016-07-13 HISTORY — DX: Abdominal aortic aneurysm, without rupture, unspecified: I71.40

## 2016-07-13 HISTORY — DX: Presence of dental prosthetic device (complete) (partial): Z97.2

## 2016-07-13 HISTORY — DX: Hematuria, unspecified: R31.9

## 2016-07-13 HISTORY — DX: Emphysema, unspecified: J43.9

## 2016-07-13 HISTORY — PX: CYSTOSCOPY/URETEROSCOPY/HOLMIUM LASER/STENT PLACEMENT: SHX6546

## 2016-07-13 HISTORY — DX: Abdominal aortic aneurysm, without rupture: I71.4

## 2016-07-13 HISTORY — DX: Chronic atrial fibrillation, unspecified: I48.20

## 2016-07-13 HISTORY — DX: Peripheral vascular disease, unspecified: I73.9

## 2016-07-13 HISTORY — DX: Personal history of traumatic brain injury: Z87.820

## 2016-07-13 HISTORY — DX: Adverse effect of unspecified anesthetic, initial encounter: T41.45XA

## 2016-07-13 HISTORY — DX: Gastro-esophageal reflux disease without esophagitis: K21.9

## 2016-07-13 HISTORY — DX: Personal history of other diseases of the nervous system and sense organs: Z86.69

## 2016-07-13 HISTORY — DX: Calculus of ureter: N20.1

## 2016-07-13 HISTORY — DX: Dyspnea, unspecified: R06.00

## 2016-07-13 HISTORY — DX: Complete loss of teeth, unspecified cause, unspecified class: K08.109

## 2016-07-13 HISTORY — DX: Benign prostatic hyperplasia with lower urinary tract symptoms: N40.1

## 2016-07-13 HISTORY — DX: Other symptoms and signs involving the musculoskeletal system: R29.898

## 2016-07-13 HISTORY — DX: Spondylosis without myelopathy or radiculopathy, site unspecified: M47.819

## 2016-07-13 HISTORY — DX: Other forms of dyspnea: R06.09

## 2016-07-13 HISTORY — DX: Long term (current) use of anticoagulants: Z79.01

## 2016-07-13 HISTORY — DX: Anesthesia of skin: R20.0

## 2016-07-13 HISTORY — DX: Personal history of transient ischemic attack (TIA), and cerebral infarction without residual deficits: Z86.73

## 2016-07-13 HISTORY — DX: Major depressive disorder, single episode, unspecified: F32.9

## 2016-07-13 HISTORY — DX: Presence of spectacles and contact lenses: Z97.3

## 2016-07-13 LAB — POCT I-STAT, CHEM 8
BUN: 7 mg/dL (ref 6–20)
Calcium, Ion: 1.2 mmol/L (ref 1.15–1.40)
Chloride: 102 mmol/L (ref 101–111)
Creatinine, Ser: 0.8 mg/dL (ref 0.61–1.24)
Glucose, Bld: 108 mg/dL — ABNORMAL HIGH (ref 65–99)
HCT: 51 % (ref 39.0–52.0)
Hemoglobin: 17.3 g/dL — ABNORMAL HIGH (ref 13.0–17.0)
Potassium: 3.3 mmol/L — ABNORMAL LOW (ref 3.5–5.1)
Sodium: 139 mmol/L (ref 135–145)
TCO2: 27 mmol/L (ref 0–100)

## 2016-07-13 LAB — CBC WITH DIFFERENTIAL/PLATELET
BASOS ABS: 0 10*3/uL (ref 0.0–0.1)
Basophils Relative: 0 %
EOS PCT: 0 %
Eosinophils Absolute: 0 10*3/uL (ref 0.0–0.7)
HCT: 40.6 % (ref 39.0–52.0)
Hemoglobin: 14.2 g/dL (ref 13.0–17.0)
LYMPHS PCT: 22 %
Lymphs Abs: 1.6 10*3/uL (ref 0.7–4.0)
MCH: 32.7 pg (ref 26.0–34.0)
MCHC: 35 g/dL (ref 30.0–36.0)
MCV: 93.5 fL (ref 78.0–100.0)
MONO ABS: 0.1 10*3/uL (ref 0.1–1.0)
Monocytes Relative: 1 %
Neutro Abs: 5.3 10*3/uL (ref 1.7–7.7)
Neutrophils Relative %: 77 %
PLATELETS: 190 10*3/uL (ref 150–400)
RBC: 4.34 MIL/uL (ref 4.22–5.81)
RDW: 13.4 % (ref 11.5–15.5)
WBC: 7 10*3/uL (ref 4.0–10.5)

## 2016-07-13 LAB — ACETAMINOPHEN LEVEL: Acetaminophen (Tylenol), Serum: <10 ug/mL — ABNORMAL LOW (ref 10–30)

## 2016-07-13 LAB — RAPID URINE DRUG SCREEN, HOSP PERFORMED
Amphetamines: NOT DETECTED
BARBITURATES: NOT DETECTED
Benzodiazepines: POSITIVE — AB
Cocaine: NOT DETECTED
OPIATES: NOT DETECTED
TETRAHYDROCANNABINOL: POSITIVE — AB

## 2016-07-13 LAB — PROTIME-INR
INR: 1.7
Prothrombin Time: 20.2 seconds — ABNORMAL HIGH (ref 11.4–15.2)

## 2016-07-13 LAB — SALICYLATE LEVEL: Salicylate Lvl: <7 mg/dL (ref 2.8–30.0)

## 2016-07-13 SURGERY — CYSTOSCOPY/URETEROSCOPY/HOLMIUM LASER/STENT PLACEMENT
Anesthesia: General | Site: Bladder | Laterality: Right

## 2016-07-13 MED ORDER — ONDANSETRON HCL 4 MG/2ML IJ SOLN
INTRAMUSCULAR | Status: DC | PRN
  Administered 2016-07-13: 4 mg via INTRAVENOUS

## 2016-07-13 MED ORDER — WARFARIN - PHARMACIST DOSING INPATIENT: Freq: Every day | Status: DC

## 2016-07-13 MED ORDER — METOCLOPRAMIDE HCL 5 MG/ML IJ SOLN
10.0000 mg | Freq: Once | INTRAMUSCULAR | Status: DC | PRN
  Filled 2016-07-13: qty 2

## 2016-07-13 MED ORDER — ONDANSETRON HCL 4 MG PO TABS: 4.0000 mg | ORAL_TABLET | Freq: Three times a day (TID) | ORAL | Status: DC | PRN

## 2016-07-13 MED ORDER — DEXAMETHASONE SODIUM PHOSPHATE 10 MG/ML IJ SOLN
INTRAMUSCULAR | Status: AC
  Filled 2016-07-13: qty 1

## 2016-07-13 MED ORDER — KETOROLAC TROMETHAMINE 60 MG/2ML IM SOLN
INTRAMUSCULAR | Status: DC | PRN
  Administered 2016-07-13: 30 mg via INTRAMUSCULAR

## 2016-07-13 MED ORDER — ACETAMINOPHEN 500 MG PO TABS: 500.0000 mg | ORAL_TABLET | Freq: Four times a day (QID) | ORAL | Status: DC | PRN

## 2016-07-13 MED ORDER — TRAMADOL HCL 50 MG PO TABS
50.0000 mg | ORAL_TABLET | Freq: Four times a day (QID) | ORAL | Status: DC | PRN
  Administered 2016-07-14: 50 mg via ORAL
  Filled 2016-07-13: qty 1

## 2016-07-13 MED ORDER — FENTANYL CITRATE (PF) 100 MCG/2ML IJ SOLN
INTRAMUSCULAR | Status: AC
  Filled 2016-07-13: qty 2

## 2016-07-13 MED ORDER — GABAPENTIN 100 MG PO CAPS
100.0000 mg | ORAL_CAPSULE | Freq: Every day | ORAL | Status: DC
  Administered 2016-07-14: 100 mg via ORAL
  Filled 2016-07-13: qty 1

## 2016-07-13 MED ORDER — CIPROFLOXACIN IN D5W 400 MG/200ML IV SOLN
INTRAVENOUS | Status: AC
  Filled 2016-07-13: qty 200

## 2016-07-13 MED ORDER — FENTANYL CITRATE (PF) 100 MCG/2ML IJ SOLN
INTRAMUSCULAR | Status: DC | PRN
  Administered 2016-07-13: 50 ug via INTRAVENOUS
  Administered 2016-07-13 (×2): 25 ug via INTRAVENOUS

## 2016-07-13 MED ORDER — ALUM & MAG HYDROXIDE-SIMETH 200-200-20 MG/5ML PO SUSP: 30.0000 mL | Freq: Four times a day (QID) | ORAL | Status: DC | PRN

## 2016-07-13 MED ORDER — PROPOFOL 10 MG/ML IV BOLUS
INTRAVENOUS | Status: AC
  Filled 2016-07-13: qty 40

## 2016-07-13 MED ORDER — MEPERIDINE HCL 25 MG/ML IJ SOLN
6.2500 mg | INTRAMUSCULAR | Status: DC | PRN
  Filled 2016-07-13: qty 1

## 2016-07-13 MED ORDER — SODIUM CHLORIDE 0.9 % IR SOLN
Status: DC | PRN
  Administered 2016-07-13: 1000 mL via INTRAVESICAL
  Administered 2016-07-13: 3000 mL

## 2016-07-13 MED ORDER — FUROSEMIDE 20 MG PO TABS
20.0000 mg | ORAL_TABLET | Freq: Every day | ORAL | Status: DC
  Administered 2016-07-13: 20 mg via ORAL
  Filled 2016-07-13 (×2): qty 1

## 2016-07-13 MED ORDER — ALBUTEROL SULFATE HFA 108 (90 BASE) MCG/ACT IN AERS: 2.0000 | INHALATION_SPRAY | Freq: Four times a day (QID) | RESPIRATORY_TRACT | Status: DC | PRN

## 2016-07-13 MED ORDER — WARFARIN SODIUM 5 MG PO TABS: 5.0000 mg | ORAL_TABLET | Freq: Every day | ORAL | Status: DC

## 2016-07-13 MED ORDER — DEXAMETHASONE SODIUM PHOSPHATE 4 MG/ML IJ SOLN
INTRAMUSCULAR | Status: DC | PRN
  Administered 2016-07-13: 10 mg via INTRAVENOUS

## 2016-07-13 MED ORDER — DILTIAZEM HCL ER BEADS 240 MG PO CP24
240.0000 mg | ORAL_CAPSULE | Freq: Every day | ORAL | Status: DC
  Administered 2016-07-14: 240 mg via ORAL
  Filled 2016-07-13: qty 1

## 2016-07-13 MED ORDER — NICOTINE 21 MG/24HR TD PT24: 21.0000 mg | MEDICATED_PATCH | Freq: Every day | TRANSDERMAL | Status: DC

## 2016-07-13 MED ORDER — LIDOCAINE 2% (20 MG/ML) 5 ML SYRINGE
INTRAMUSCULAR | Status: AC
  Filled 2016-07-13: qty 5

## 2016-07-13 MED ORDER — POTASSIUM CHLORIDE CRYS ER 20 MEQ PO TBCR
40.0000 meq | EXTENDED_RELEASE_TABLET | Freq: Once | ORAL | Status: AC
  Administered 2016-07-14: 40 meq via ORAL
  Filled 2016-07-13: qty 2

## 2016-07-13 MED ORDER — METOPROLOL TARTRATE 25 MG PO TABS
100.0000 mg | ORAL_TABLET | Freq: Two times a day (BID) | ORAL | Status: DC
  Administered 2016-07-14 (×2): 100 mg via ORAL
  Filled 2016-07-13 (×2): qty 4

## 2016-07-13 MED ORDER — FENTANYL CITRATE (PF) 100 MCG/2ML IJ SOLN
25.0000 ug | INTRAMUSCULAR | Status: DC | PRN
  Administered 2016-07-13: 50 ug via INTRAVENOUS
  Filled 2016-07-13: qty 1

## 2016-07-13 MED ORDER — CIPROFLOXACIN IN D5W 400 MG/200ML IV SOLN
400.0000 mg | INTRAVENOUS | Status: AC
  Administered 2016-07-13: 400 mg via INTRAVENOUS
  Filled 2016-07-13: qty 200

## 2016-07-13 MED ORDER — LACTATED RINGERS IV SOLN
INTRAVENOUS | Status: DC
  Administered 2016-07-13 (×2): via INTRAVENOUS
  Filled 2016-07-13: qty 1000

## 2016-07-13 MED ORDER — HALOPERIDOL 5 MG PO TABS
5.0000 mg | ORAL_TABLET | Freq: Once | ORAL | Status: AC
  Administered 2016-07-13: 5 mg via ORAL
  Filled 2016-07-13: qty 1

## 2016-07-13 MED ORDER — MIDAZOLAM HCL 2 MG/2ML IJ SOLN
INTRAMUSCULAR | Status: AC
  Filled 2016-07-13: qty 2

## 2016-07-13 MED ORDER — ATORVASTATIN CALCIUM 40 MG PO TABS
40.0000 mg | ORAL_TABLET | Freq: Every day | ORAL | Status: DC
  Administered 2016-07-13: 40 mg via ORAL
  Filled 2016-07-13: qty 1

## 2016-07-13 MED ORDER — LACTATED RINGERS IV SOLN
INTRAVENOUS | Status: DC
  Filled 2016-07-13: qty 1000

## 2016-07-13 MED ORDER — WARFARIN SODIUM 7.5 MG PO TABS
7.5000 mg | ORAL_TABLET | Freq: Once | ORAL | Status: DC
  Filled 2016-07-13: qty 1

## 2016-07-13 MED ORDER — LIDOCAINE 2% (20 MG/ML) 5 ML SYRINGE
INTRAMUSCULAR | Status: DC | PRN
  Administered 2016-07-13: 100 mg via INTRAVENOUS

## 2016-07-13 MED ORDER — ONDANSETRON HCL 4 MG/2ML IJ SOLN
INTRAMUSCULAR | Status: AC
  Filled 2016-07-13: qty 2

## 2016-07-13 MED ORDER — MIDAZOLAM HCL 5 MG/5ML IJ SOLN
INTRAMUSCULAR | Status: DC | PRN
  Administered 2016-07-13: 2 mg via INTRAVENOUS

## 2016-07-13 MED ORDER — LORAZEPAM 1 MG PO TABS
1.0000 mg | ORAL_TABLET | Freq: Once | ORAL | Status: AC
  Administered 2016-07-13: 1 mg via ORAL
  Filled 2016-07-13: qty 1

## 2016-07-13 MED ORDER — DIPHENHYDRAMINE HCL 25 MG PO CAPS
50.0000 mg | ORAL_CAPSULE | Freq: Once | ORAL | Status: AC
  Administered 2016-07-13: 50 mg via ORAL
  Filled 2016-07-13: qty 2

## 2016-07-13 MED ORDER — PROPOFOL 10 MG/ML IV BOLUS
INTRAVENOUS | Status: DC | PRN
  Administered 2016-07-13: 170 mg via INTRAVENOUS
  Administered 2016-07-13: 30 mg via INTRAVENOUS

## 2016-07-13 MED ORDER — IOHEXOL 300 MG/ML  SOLN
INTRAMUSCULAR | Status: DC | PRN
  Administered 2016-07-13: 14 mL via INTRAVENOUS

## 2016-07-13 SURGICAL SUPPLY — 32 items
BAG DRAIN URO-CYSTO SKYTR STRL (DRAIN) ×2 IMPLANT
BASKET LASER NITINOL 1.9FR (BASKET) IMPLANT
BASKET STNLS GEMINI 4WIRE 3FR (BASKET) IMPLANT
BASKET ZERO TIP NITINOL 2.4FR (BASKET) IMPLANT
CATH INTERMIT  6FR 70CM (CATHETERS) IMPLANT
CATH URET 5FR 28IN CONE TIP (BALLOONS)
CATH URET 5FR 70CM CONE TIP (BALLOONS) IMPLANT
CLOTH BEACON ORANGE TIMEOUT ST (SAFETY) ×2 IMPLANT
ELECT REM PT RETURN 9FT ADLT (ELECTROSURGICAL)
ELECTRODE REM PT RTRN 9FT ADLT (ELECTROSURGICAL) IMPLANT
FIBER LASER FLEXIVA 365 (UROLOGICAL SUPPLIES) IMPLANT
FIBER LASER FLEXIVA 550 (UROLOGICAL SUPPLIES) IMPLANT
FIBER LASER TRAC TIP (UROLOGICAL SUPPLIES) IMPLANT
GLOVE BIO SURGEON STRL SZ8 (GLOVE) ×2 IMPLANT
GOWN STRL REUS W/ TWL LRG LVL3 (GOWN DISPOSABLE) ×1 IMPLANT
GOWN STRL REUS W/ TWL XL LVL3 (GOWN DISPOSABLE) ×1 IMPLANT
GOWN STRL REUS W/TWL LRG LVL3 (GOWN DISPOSABLE) ×1
GOWN STRL REUS W/TWL XL LVL3 (GOWN DISPOSABLE) ×1
GUIDEWIRE 0.038 PTFE COATED (WIRE) IMPLANT
GUIDEWIRE ANG ZIPWIRE 038X150 (WIRE) IMPLANT
GUIDEWIRE STR DUAL SENSOR (WIRE) ×2 IMPLANT
IV NS IRRIG 3000ML ARTHROMATIC (IV SOLUTION) ×2 IMPLANT
KIT BALLIN UROMAX 15FX10 (LABEL) IMPLANT
KIT BALLN UROMAX 15FX4 (MISCELLANEOUS) IMPLANT
KIT BALLN UROMAX 26 75X4 (MISCELLANEOUS)
KIT RM TURNOVER CYSTO AR (KITS) ×2 IMPLANT
MANIFOLD NEPTUNE II (INSTRUMENTS) IMPLANT
PACK CYSTO (CUSTOM PROCEDURE TRAY) ×2 IMPLANT
SET HIGH PRES BAL DIL (LABEL)
SHEATH ACCESS URETERAL 38CM (SHEATH) IMPLANT
TUBE CONNECTING 12X1/4 (SUCTIONS) IMPLANT
WATER STERILE IRR 3000ML UROMA (IV SOLUTION) IMPLANT

## 2016-07-13 NOTE — ED Notes (Signed)
Patient gave permission for nurse to call patient's friend to let him know where he is and why.

## 2016-07-13 NOTE — ED Notes (Signed)
Patient's belongings placed in #30 locker.

## 2016-07-13 NOTE — ED Notes (Signed)
Bed: EU79 Expected date:  Expected time:  Means of arrival:  Comments: Hold for patient from surgery center

## 2016-07-13 NOTE — Transfer of Care (Signed)
Immediate Anesthesia Transfer of Care Note  Patient: [name]  Procedure(s) Performed: Procedure(s) (LRB): CYSTOSCOPY, RIGHT URETEROSCOPY RETROGRADE PYELOGRAM (Right)  Patient Location: PACU  Anesthesia Type: General  Level of Consciousness: awake, sedated, patient cooperative and responds to stimulation  Airway & Oxygen Therapy: Patient Spontanous Breathing and Patient connected to [place] O2  Post-op Assessment: Report given to PACU RN, Post -op Vital signs reviewed and stable and Patient moving all extremities  Post vital signs: Reviewed and stable  Complications: No apparent anesthesia complications

## 2016-07-13 NOTE — ED Notes (Signed)
Patient sleeping after having an anger outburst. Will offer medication to patient once he wakes up.
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
## 2016-07-13 NOTE — Discharge Instructions (Signed)
Cystoscopy patient instructions ° °Following a cystoscopy, a catheter (a flexible rubber tube) is sometimes left in place to empty the bladder. This may cause some discomfort or a feeling that you need to urinate. Your doctor determines the period of time that the catheter will be left in place. °You may have bloody urine for two to three days (Call your doctor if the amount of bleeding increases or does not subside). ° °You may pass blood clots in your urine, especially if you had a biopsy. It is not unusual to pass small blood clots and have some bloody urine a couple of weeks after your cystoscopy. Again, call your doctor if the bleeding does not subside. °You may have: °Dysuria (painful urination) °Frequency (urinating often) °Urgency (strong desire to urinate) ° °These symptoms are common especially if medicine is instilled into the bladder or a ureteral stent is placed. Avoiding alcohol and caffeine, such as coffee, tea, and chocolate, may help relieve these symptoms. Drink plenty of water, unless otherwise instructed. Your doctor may also prescribe an antibiotic or other medicine to reduce these symptoms. ° °Cystoscopy results are available soon after the procedure; biopsy results usually take two to four days. Your doctor will discuss the results of your exam with you. Before you go home, you will be given specific instructions for follow-up care. °Special Instructions: ° °1  If you are going home with a catheter in place do not take a tub bath until removed by your doctor.  °2  You may resume your normal activities.  °3  Do not drive or operate machinery if you are taking narcotic pain medicine.  °4  Be sure to keep all follow-up appointments with your doctor.  ° °5 Call Your Doctor If: The catheter is not draining ° You have severe pain ° You are unable to urinate ° You have a fever over 101 ° You have severe bleeding °        ° °Post Anesthesia Home Care Instructions ° °Activity: °Get plenty of rest for  the remainder of the day. A responsible individual must stay with you for 24 hours following the procedure.  °For the next 24 hours, DO NOT: °-Drive a car °-Operate machinery °-Drink alcoholic beverages °-Take any medication unless instructed by your physician °-Make any legal decisions or sign important papers. ° °Meals: °Start with liquid foods such as gelatin or soup. Progress to regular foods as tolerated. Avoid greasy, spicy, heavy foods. If nausea and/or vomiting occur, drink only clear liquids until the nausea and/or vomiting subsides. Call your physician if vomiting continues. ° °Special Instructions/Symptoms: °Your throat may feel dry or sore from the anesthesia or the breathing tube placed in your throat during surgery. If this causes discomfort, gargle with warm salt water. The discomfort should disappear within 24 hours. ° °If you had a scopolamine patch placed behind your ear for the management of post- operative nausea and/or vomiting: ° °1. The medication in the patch is effective for 72 hours, after which it should be removed.  Wrap patch in a tissue and discard in the trash. Wash hands thoroughly with soap and water. °2. You may remove the patch earlier than 72 hours if you experience unpleasant side effects which may include dry mouth, dizziness or visual disturbances. °3. Avoid touching the patch. Wash your hands with soap and water after contact with the patch. °  ° °

## 2016-07-13 NOTE — Progress Notes (Signed)
Due to patient verbalizing his thoughts on suicide, ("He was done with all of this") on arrival to pre op. Followed up per protocol with house coverage

## 2016-07-13 NOTE — Progress Notes (Signed)
ANTICOAGULATION CONSULT NOTE - Initial Consult  Pharmacy Consult for Warfarin Indication: atrial fibrillation, history of stroke  Allergies  Allergen Reactions  . Oxycodone Itching    Patient Measurements:   Height: 5'8.5" Weight: 76.2 kg  Vital Signs: Temp: 98.2 F (36.8 C) (06/05 1400) Temp Source: Axillary (06/05 1817) BP: 157/91 (06/05 1817) Pulse Rate: 69 (06/05 1817)  Labs:  Recent Labs  07/13/16 1052 07/13/16 1428 07/13/16 1703  HGB 17.3* 14.2  --   HCT 51.0 40.6  --   PLT  --  190  --   LABPROT  --   --  20.2*  INR  --   --  1.70  CREATININE 0.80  --   --     Estimated Creatinine Clearance: 89.4 mL/min (by C-G formula based on SCr of 0.8 mg/dL).   Medical History: Past Medical History:  Diagnosis Date  . AAA (abdominal aortic aneurysm) ([place])    per last CT 03-26-2016---  3.9cm  . Anticoagulated on Coumadin   . Arthritis of spine ([place])   . Benign localized prostatic hyperplasia with lower urinary tract symptoms (LUTS)   . Chronic atrial fibrillation ([place]) cardiolgoist -- dr Stanford Breed   dx 2009  . Complication of anesthesia    per pt today (07-08-2016) had problem after having anesthesia 12/ 2016 surgery [facility] "not good" went to ED ( in epic ED visit 01/ 2017 dx severe episode of major depression disorder and admitted to [facility]  . COPD with emphysema ([employer] [employer])   . Dyspnea on exertion   . Full dentures   . GERD (gastroesophageal reflux disease)   . Hematuria   . History of amaurosis fugax    right eye 02-08-2015 -- resolved /  documented in epic possible left eye amaurosis fugax 11/ 2008 (pt denies) / per last MRI show previous bilateral occipital lobe infarcts  . History of concussion    as child  . History of CVA (cerebrovascular accident) 10/ 2016  and per pt Dec 2017 in [place]   neurologist-  dr Leonie Man-- per note silent infarcts on MRI-- multiple acute infarcts bilateral cerebullm, right temporal lobe, and bilateral occipital lobes due to embolic  phenomena (atrial fib.)  . History of seizures as a child   . Hyperlipidemia   . Hypertension   . Left arm numbness    occasional numbess post residual axilla arterial occlusion and surgery  . Major depressive disorder    hx severe episode without psychosis 02/2015 w/ homicidal ideation  . Narcotic dependence ([place])   . PAD (peripheral artery disease) ([place])    followed by dr fields-- s/p  left axilla to branchial and left axilla to ulnar bypass graft due to ischemic hand  . Pulmonary nodule    per CT 03-26-2016  right lower lobe  . PVD (peripheral vascular disease) ([place])   . Right ureteral stone   . Senile purpura ([place])   . Weakness of left arm    residual from axilla arterial occlusion and post surgery  . Wears glasses     Assessment: 30 y/oM with PMH of AAA, COPD, PAD, HTN, HLD, history of kidney stones, a-fib, stroke on chronic warfarin therapy who presented to [facility] on 07/14/2016 for cystoscopy with right retrograde pyelogram and right ureteroscopy. No stone was found, so it was assumed patient passed the stone. During intake for procedure, patient endorsed suicidal ideations, so was transferred to [facility] ED after procedure. Pharmacy consulted to manage warfarin therapy while patient in the  hospital. PTA warfarin regimen reported as 5mg  PO daily, with last dose reported as taken on 6/4. INR today = 1.7. CBC WNL.      Goal of Therapy:  INR 2-3 Monitor platelets by anticoagulation protocol: Yes   Plan:   Give warfarin 7.5mg  PO x 1 today.  Daily PT/INR.  Monitor CBC periodically and for s/sx of bleeding.     Lindell Spar, PharmD, BCPS Pager: [phone] 07/13/2016 6:39 PM

## 2016-07-13 NOTE — Op Note (Signed)
PATIENT:  [name]  PRE-OPERATIVE DIAGNOSIS:  right Ureteral calculus  POST-OPERATIVE DIAGNOSIS: History of right ureteral calculus  PROCEDURE:  1. Cystoscopy with right retrograde pyelogram including interpretation. 2. Right ureteroscopy 3. Fluoroscopy time less than 1 hour  SURGEON: Claybon Jabs, MD  INDICATION: [name] is a 66 year old male who was found to have right mid ureteral stone. He was placed on medical expulsive therapy and his stone failed to progress so we discussed seeding with ureteroscopic removal of the stone. He indicates he has not seen his stone pass. He has not had any flank pain. His preoperative KUB did not reveal the stone although it could potentially be overlying the sacrum.  ANESTHESIA:  General  EBL:  Minimal  DRAINS: None  SPECIMEN:  None  DESCRIPTION OF PROCEDURE: The patient was taken to the major OR and placed on the table. General anesthesia was administered and then the patient was moved to the dorsal lithotomy position. The genitalia was sterilely prepped and draped. An official timeout was performed.  Initially the 43 French cystoscope with 30 lens was passed under direct vision into the bladder. The bladder was then fully inspected. It was noted be free of any tumors, stones or inflammatory lesions. Ureteral orifices were of normal configuration and position. A 6 French open-ended ureteral catheter was then passed through the cystoscope into the ureteral orifice in order to perform a right retrograde pyelogram.  A retrograde pyelogram was performed by injecting full-strength contrast up the right ureter under direct fluoroscopic control. 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 No filling defects could be definitely identified. The remainder of the ureter was noted to be normal as was the intrarenal collecting system. I then passed a 0.038 inch floppy-tipped guidewire through the open ended catheter and into  the area of the renal pelvis of the upper pole moiety and this was left in place. I then proceeded with ureteroscopy.  A 6 French rigid ureteroscope was then passed under direct into the bladder and into the right orifice and up the ureter. I advanced the scope under direct vision up the right ureter noting no stones or other abnormalities. I followed the course of the guidewire up to the upper pole moiety just below the ureteropelvic junction with no stone having been identified. I therefore backed the scope down the ureter to where the bifurcation was identified and advanced the scope up the ureter portending the lower pole moiety. No stone was identified in this portion of the ureter either but I confirmed this by reinjecting Omnipaque contrast through the ureteroscope and noting no filling defects. I therefore removed the ureteroscope. Cystoscope with obturator was inserted in the bladder which was drained and the cystoscope was then removed. The patient tolerated the procedure well no intraoperative complications.  PLAN OF CARE: Discharge to home after PACU  PATIENT DISPOSITION:  PACU - hemodynamically stable.

## 2016-07-13 NOTE — Anesthesia Postprocedure Evaluation (Signed)
Anesthesia Post Note  Patient: [name]  Procedure(s) Performed: Procedure(s) (LRB): CYSTOSCOPY, RIGHT URETEROSCOPY RETROGRADE PYELOGRAM (Right)     Patient location during evaluation: PACU Anesthesia Type: General Level of consciousness: awake and alert Pain management: pain level controlled Vital Signs Assessment: post-procedure vital signs reviewed and stable Respiratory status: spontaneous breathing, nonlabored ventilation, respiratory function stable and patient connected to nasal cannula oxygen Cardiovascular status: blood pressure returned to baseline and stable Postop Assessment: no signs of nausea or vomiting Anesthetic complications: no    Last Vitals:  Vitals:   07/13/16 1300 07/13/16 1315  BP: (!) 155/115 (!) 149/96  Pulse: 65 75  Resp: 18 15  Temp:      Last Pain:  Vitals:   07/13/16 1300  TempSrc:   PainSc: 8                  Montez Hageman

## 2016-07-13 NOTE — Anesthesia Preprocedure Evaluation (Addendum)
Anesthesia Evaluation  Patient identified by MRN, date of birth, ID band Patient awake    Reviewed: Allergy & Precautions, NPO status , Patient's Chart, lab work & pertinent test results  Airway Mallampati: I   Neck ROM: Full    Dental  (+) Edentulous Lower, Edentulous Upper   Pulmonary Current Smoker,    Pulmonary exam normal breath sounds clear to auscultation       Cardiovascular hypertension, Pt. on medications and Pt. on home beta blockers Normal cardiovascular exam Rhythm:Regular Rate:Normal     Neuro/Psych PSYCHIATRIC DISORDERS Depression Consulting with house coverage re do you proceed with surgery if patient has suicidal ideations. Patient will need a psychiatric consult prior to surgery according to house coverage. Surgeon has been notified.   GI/Hepatic   Endo/Other  negative endocrine ROS  Renal/GU      Musculoskeletal   Abdominal Normal abdominal exam  (+)   Peds  Hematology   Anesthesia Other Findings Notes   Notes Recorded by [name] Antis, RN on 06/23/2015 at 3:51 PM Per Dr Leonie Man, LVM for patient informing him that his carotid doppler study was unremarkable. Left name, number for any questions. ------  [name] Hong, MD Ordering physician: Gabriel Earing, PA-C Study date: 11/25/14 Study Result   Result status: Final result                             *[name]*                         [address] [place], [place] [address]                            [phone]  ------------------------------------------------------------------- Transthoracic Echocardiography  Patient:    [name], [name] MR #:       [id number] Study Date: 11/25/2014 Gender:     M Age:        67 Height:     177.8 cm Weight:     80.7 kg BSA:        2.01 m^2 Pt. Status: Room:       [facility]  SONOGRAPHER  [name], RDCS  ORDERING     [name]  REFERRING    Gabriel Earing  PERFORMING   [name]  ATTENDING    [name]  cc:  ------------------------------------------------------------------- LV EF: 60% -   65%  ------------------------------------------------------------------- Indications:      CVA 29.  ------------------------------------------------------------------- History:   PMH:   Atrial fibrillation.  Chronic obstructive pulmonary disease.  Risk factors:  Current tobacco use. Hypertension.  ------------------------------------------------------------------- Study Conclusions  - Left ventricle: The cavity size was normal. There was moderate   concentric hypertrophy. Systolic function was normal. The   estimated ejection fraction was in the range of 60% to 65%. Wall   motion was normal; there were no regional wall motion   abnormalities. The study was not technically sufficient to allow   evaluation of LV diastolic dysfunction due to atrial   fibrillation. - Mitral valve: There was mild regurgitation. -  Left atrium: The atrium was severely dilated.     Reproductive/Obstetrics                            Anesthesia Physical Anesthesia Plan  ASA: III  Anesthesia Plan: General   Post-op Pain Management:    Induction: Intravenous  PONV Risk Score and Plan: 1 and Ondansetron and Treatment may vary due to age  Airway Management Planned: LMA  Additional Equipment:   Intra-op Plan:   Post-operative Plan:   Informed Consent: I have reviewed the patients History and Physical, chart, labs and discussed the procedure including the risks, benefits and alternatives for the proposed anesthesia with the patient or authorized representative who has indicated his/her understanding and acceptance.     Plan Discussed with: CRNA and Surgeon  Anesthesia Plan Comments:         Anesthesia Quick Evaluation

## 2016-07-13 NOTE — ED Notes (Signed)
Patient getting increasingly agitated.  Dr. Laverta [place] notified.  MD to take out IVC papers on patient.  Medications ordered for sedation.

## 2016-07-13 NOTE — ED Provider Notes (Addendum)
Emergency Department Provider Note   I have reviewed the triage vital signs and the nursing notes.   HISTORY  Chief Complaint Suicidal   HPI [name] is a 66 y.o. male with PMH of AAA, a-fib on coumadin, COPD, PAD, HTN, HLD, and h/o kidney stones presents to the emergency department for evaluation of worsening suicidal thinking. The patient was at the hospital today for a urology appointment. He had a procedure done to evaluate for kidney stone which was normal. During the intake for the procedure he endorsed worsening suicidal thinking and was transferred to the emergency department after the procedure. He states that family are stealing money from him and he is becoming increasingly frustrated. He reports calling police but states that done nothing for him. He reports ongoing suicidal thinking because of this but has gotten significantly worse in the last several days. Patient states he's "not sure" if he has a plan or not but does have access to guns. He has a remote history of suicide attempt. He denies taking medication for psychiatry reasons. He also endorses thoughts of harming family members stolen from him. He states the main person is currently in jail. He states "the law won't take care of it so I just feel like killing myself."   Patient's only medical complaint at this time and was bandlike abdominal pain across his umbilicus. It is bilateral and constant. He thought it may have been kidney stones which led to the urology evaluation. He had a recent CT to evaluate his AAA which was 3.9 cm in 03/2016.   Past Medical History:  Diagnosis Date  . AAA (abdominal aortic aneurysm) ([place])    per last CT 03-26-2016---  3.9cm  . Anticoagulated on Coumadin   . Arthritis of spine ([place])   . Benign localized prostatic hyperplasia with lower urinary tract symptoms (LUTS)   . Chronic atrial fibrillation ([place]) cardiolgoist -- dr Stanford Breed   dx 2009  . Complication of anesthesia    per pt today (07-08-2016) had problem after having anesthesia 12/ 2016 surgery at [facility] "not good" went to ED ( in epic ED visit 01/ 2017 dx severe episode of major depression disorder and admitted to [facility]  . COPD with emphysema ([place])   . Dyspnea on exertion   . Full dentures   . GERD (gastroesophageal reflux disease)   . Hematuria   . History of amaurosis fugax    right eye 02-08-2015 -- resolved /  documented in epic possible left eye amaurosis fugax 11/ 2008 (pt denies) / per last MRI show previous bilateral occipital lobe infarcts  . History of concussion    as child  . History of CVA (cerebrovascular accident) 10/ 2016  and per pt Dec 2017 in [place]   neurologist-  dr Leonie Man-- per note silent infarcts on MRI-- multiple acute infarcts bilateral cerebullm, right temporal lobe, and bilateral occipital lobes due to embolic phenomena (atrial fib.)  . History of seizures as a child   . Hyperlipidemia   . Hypertension   . Left arm numbness    occasional numbess post residual axilla arterial occlusion and surgery  . Major depressive disorder    hx severe episode without psychosis 02/2015 w/ homicidal ideation  . Narcotic dependence ([place])   . PAD (peripheral artery disease) ([place])    followed by dr fields-- s/p  left axilla to branchial and left axilla to ulnar bypass graft due to ischemic hand  . Pulmonary nodule    per  CT 03-26-2016  right lower lobe  . PVD (peripheral vascular disease) ([place])   . Right ureteral stone   . Senile purpura ([place])   . Weakness of left arm    residual from axilla arterial occlusion and post surgery  . Wears glasses     Patient Active Problem List   Diagnosis Date Noted  . Hyperlipemia 07/07/2016  . Arterial occlusion due to stenosis ([place]) 08/21/2015  . Aftercare following surgery of the circulatory system 08/21/2015  . Current smoker 08/21/2015  . Abdominal aortic aneurysm (AAA) ([place]) 05/02/2015  . MDD (major depressive disorder), single episode,  severe , no psychosis ([place]) 02/10/2015  . Major depressive disorder, recurrent, severe without psychotic features ([place]) 02/09/2015  . Limb ischemia 01/23/2015  . Numbness 01/20/2015  . PAD (peripheral artery disease) ([place]) 12/10/2014  . Embolic stroke ([place]) 09/73/5329  . Ischemia of extremity 11/20/2014  . Ischemia of hand 04/05/2014  . Special screening for malignant neoplasms, colon 03/20/2014  . Warfarin-induced coagulopathy ([place]) 03/20/2014  . Encounter for therapeutic drug monitoring 03/08/2013  . Headache 01/29/2013  . Prostate nodule with urinary obstruction 12/13/2012  . Routine general medical examination at a health care facility 12/13/2012  . Depression with anxiety 12/07/2012  . Romone Shaff term (current) use of anticoagulants 04/30/2010  . HYPERCHOLESTEROLEMIA-PURE 04/22/2008  . TOBACCO ABUSE 04/22/2008  . HYPERTENSION, BENIGN ESSENTIAL 04/22/2008  . Atrial fibrillation ([place]) 04/22/2008  . COPD (chronic obstructive pulmonary disease) ([place]) 04/22/2008    Past Surgical History:  Procedure Laterality Date  . ARCH AORTOGRAM N/A 04/05/2014   Procedure: ARCH AORTOGRAM, FIRST ORDER CATHETERIZATION LEFT SUBCLAVIAN ARTERY;  Surgeon: Elam Dutch, MD;  Location: [facility] OR;  Service: Vascular;  Laterality: N/A;  . AXILLARY-FEMORAL BYPASS GRAFT Left 04/08/2014   Procedure: LEFT AXILLARY ARTERY TO BRACHIAL ARTERY BYPASS USING NON REVERSE LEFT GREATER SAPHENOUS VEIN ,LIGATION OF LEFT AXILLARY ARTERY ANEURYSM;  Surgeon: Elam Dutch, MD;  Location: [facility];  Service: Vascular;  Laterality: Left;  . BYPASS AXILLA/BRACHIAL ARTERY Left 01/27/2015   Procedure: LEFT BRACHIAL-ULAR ARTERY BYPASS USING GREATER SAPHENOUS VEIN;  Surgeon: Elam Dutch, MD;  Location: [facility];  Service: Vascular;  Laterality: Left;  . EMBOLECTOMY Left 04/05/2014   Procedure: THROMBO ENDARTERECTOMY OF LEFT BRACHIAL, RADIAL AND ULNAR ARTERY,  Left Radial artery cut down and radial artery thrombectomy.;  Surgeon: Elam Dutch, MD;  Location: [facility] OR;  Service: Vascular;  Laterality: Left;  . facial cyst Right    cyst removal.   . PATCH ANGIOPLASTY Left 04/05/2014   Procedure: LEFT ARM VEIN PATCH ANGIOPLASTY;  Surgeon: Elam Dutch, MD;  Location: [facility];  Service: Vascular;  Laterality: Left;  . PERIPHERAL VASCULAR CATHETERIZATION N/A 01/22/2015   Procedure: Aortic Arch Angiography;  Surgeon: Elam Dutch, MD;  Location: [facility];  Service: Cardiovascular;  Laterality: N/A;  . THROMBECTOMY BRACHIAL ARTERY Left 11/20/2014   Procedure: 1.  Thrombectomy Left Axilo-Brachial Bypass  2.  Thromboendarterecotmy of Left Brachial Artery with Fogarty Thrombectomy of Radial and Ulnar Arteries with Dacron patch angioplasty Left Brachial Artery. 3. Intraoperative  Arteriogram times four.;  Surgeon: Mal Misty, MD;  Location: [facility];  Service: Vascular;  Laterality: Left;  . TRANSTHORACIC ECHOCARDIOGRAM  11/25/2014   moderate concentric LVH, ef 60-65%, unable to evaluation LVDF due to atrial fib/  mild MR/  severe LAE  . VEIN HARVEST Left 04/08/2014   Procedure: LEFT GREATER SAPHENOUS VEIN HARVEST;  Surgeon: Elam Dutch, MD;  Location: [facility]  OR;  Service: Vascular;  Laterality: Left;  Marland Kitchen VEIN HARVEST Right 01/27/2015   Procedure: RIGHT GREATER SAPHENOUS VEIN HARVEST;  Surgeon: Elam Dutch, MD;  Location: [facility];  Service: Vascular;  Laterality: Right;    Current Outpatient Rx  . Order #: [id number] Class: Historical Med  . Order #: [id number] Class: No Print  . Order #: [id number] Class: Historical Med  . Order #: [id number] Class: No Print  . Order #: [id number] Class: Historical Med  . Order #: [id number] Class: Normal  . Order #: [id number] Class: Historical Med  . Order #: [id number] Class: Normal  . Order #: [id number] Class: Normal  . Order #: [id number] Class: Historical Med  . Order #: [id number] Class: No Print    Allergies Oxycodone  Family History  Problem Relation Age of Onset  . Hypertension Mother    . Lung cancer Mother   . Stomach cancer Father        died age 36  . Cancer Sister   . Early death Neg Hx   . Heart disease Neg Hx   . Hyperlipidemia Neg Hx   . Kidney disease Neg Hx   . Stroke Neg Hx     Social History Social History  Substance Use Topics  . Smoking status: Current Every Day Smoker    Packs/day: 2.00    Years: 51.00    Types: Cigarettes  . Smokeless tobacco: Never Used     Comment: per pt 07-08-2016 down to 2ppd from 4 ppd (quit one time for 4 years since started at age 39)  . Alcohol use 0.0 oz/week     Comment: occasional     Review of Systems  Constitutional: No fever/chills Eyes: No visual changes. ENT: No sore throat. Cardiovascular: Denies chest pain. Respiratory: Denies shortness of breath. Gastrointestinal: Positive abdominal pain.  No nausea, no vomiting.  No diarrhea.  No constipation. Genitourinary: Negative for dysuria. Musculoskeletal: Negative for back pain. Skin: Negative for rash. Neurological: Negative for headaches, focal weakness or numbness. Psychiatric:Positive depression and suicidal thinking.   10-point ROS otherwise negative.  ____________________________________________   PHYSICAL EXAM:  VITAL SIGNS: ED Triage Vitals  Enc Vitals Group     BP 07/13/16 1400 (!) 150/106     Pulse Rate 07/13/16 1400 67     Resp 07/13/16 1400 14     Temp 07/13/16 1400 98.2 F (36.8 C)     Temp Source 07/13/16 1400 Oral     SpO2 07/13/16 1400 96 %     Pain Score 07/13/16 1356 6   Constitutional: Alert and oriented. Becomes agitated quickly but is verbally redirectable.  Eyes: Conjunctivae are normal.  Head: Atraumatic. Nose: No congestion/rhinnorhea. Mouth/Throat: Mucous membranes are moist.  Oropharynx non-erythematous. Neck: No stridor.   Cardiovascular: Normal rate, regular rhythm. Good peripheral circulation. Grossly normal heart sounds.   Respiratory: Normal respiratory effort.  No retractions. Lungs CTAB. Gastrointestinal:  Soft and nontender. No distention.  Musculoskeletal: No lower extremity tenderness nor edema. No gross deformities of extremities. Neurologic:  Normal speech and language. No gross focal neurologic deficits are appreciated.  Skin:  Skin is warm, dry and intact. No rash noted. Psychiatric: Mood and affect are labile. Speech and behavior are normal.  ____________________________________________   LABS (all labs ordered are listed, but only abnormal results are displayed)  Labs Reviewed  ACETAMINOPHEN LEVEL - Abnormal; Notable for the following:       Result Value   Acetaminophen (Tylenol), Serum <10 (*)    All other components within normal limits  RAPID URINE DRUG  SCREEN, HOSP PERFORMED - Abnormal; Notable for the following:    Benzodiazepines POSITIVE (*)    Tetrahydrocannabinol POSITIVE (*)    All other components within normal limits  CBC WITH DIFFERENTIAL/PLATELET  SALICYLATE LEVEL  PROTIME-INR   ____________________________________________  EKG  Reviewed in MUSE. A-fib w/o RVR. No ST elevation or depression. No STEMI.  ____________________________________________  RADIOLOGY  Kub Day Of Procedure  Result Date: 07/13/2016 CLINICAL DATA:  Right ureteral calculus EXAM: ABDOMEN - 1 VIEW COMPARISON:  07/02/2014 and prior studies FINDINGS: 5 mm calcification projects over the lower pole right renal collecting system. Normal bowel gas pattern. Scattered left upper quadrant calcifications in the expected region of the pancreas. Pelvic vascular and prostatic calcifications as before. Regional bones unremarkable. IMPRESSION: 1. Possible right nephrolithiasis. 2. Normal  bowel gas pattern. Electronically Signed   By: Lucrezia Europe M.D.   On: 07/13/2016 10:28    ____________________________________________   PROCEDURES  Procedure(s) performed:   Procedures  None ____________________________________________   INITIAL IMPRESSION / ASSESSMENT AND PLAN / ED COURSE  Pertinent labs  & imaging results that were available during my care of the patient were reviewed by me and considered in my medical decision making (see chart for details).  Patient presents to the emergency department for evaluation of worsening suicidal thinking in the setting of family member stealing from him. He is transferred to the emergency department after a urology procedure where his suicidal ideation was uncovered. He does have access to firearms. He is also feeling like harming family but has no plans on going after them. Patient is labile but not responding to internal stimulus. I am concerned that he has a harm to both himself and others at this time. Would consider IVC in this case. BMP and EKG performed prior to urology procedure today. Added on remaining medical clearance labs.   03:22 PM Patient is becoming more belligerent and verbally abusive to staff. Plan to complete IVC paperwork and offer oral medication. Would move to IM medication if not calming down.   04:32 PM Labs reviewed. Home and PRN medications ordered. Patient is medically clear for psychiatry evaluation. IVC paperwork completed and filed.  ____________________________________________  FINAL CLINICAL IMPRESSION(S) / ED DIAGNOSES  Final diagnoses:  Suicidal ideation     MEDICATIONS GIVEN DURING THIS VISIT:  Medications  acetaminophen (TYLENOL) tablet 500 mg (not administered)  albuterol (PROVENTIL HFA;VENTOLIN HFA) 108 (90 Base) MCG/ACT inhaler 2 puff (not administered)  atorvastatin (LIPITOR) tablet 40 mg (not administered)  diltiazem (TIAZAC) 24 hr capsule 240 mg (not administered)  furosemide (LASIX) tablet 20 mg (not administered)  gabapentin (NEURONTIN) capsule 100 mg (not administered)  metoprolol tartrate (LOPRESSOR) tablet 100 mg (not administered)  traMADol (ULTRAM) tablet 50 mg (not administered)  warfarin (COUMADIN) tablet 5 mg (not administered)  alum & mag hydroxide-simeth (MAALOX/MYLANTA) 200-200-20  MG/5ML suspension 30 mL (not administered)  ondansetron (ZOFRAN) tablet 4 mg (not administered)  nicotine (NICODERM CQ - dosed in mg/24 hours) patch 21 mg (not administered)  haloperidol (HALDOL) tablet 5 mg (5 mg Oral Given 07/13/16 1542)  LORazepam (ATIVAN) tablet 1 mg (1 mg Oral Given 07/13/16 1541)  diphenhydrAMINE (BENADRYL) capsule 50 mg (50 mg Oral Given 07/13/16 1547)     NEW OUTPATIENT MEDICATIONS STARTED DURING THIS VISIT:  None   Note:  This document was prepared using Dragon voice recognition software and may include unintentional dictation errors.  Nanda Quinton, MD Emergency Medicine    Zakaiya Lares, [facility], MD 07/13/16 [phone]  Margette Fast, MD 07/13/16 [phone]

## 2016-07-13 NOTE — ED Notes (Signed)
Pt initially admitted to room #41, pt broke cabinet and was moved to room #42, IVC. Pt irritable, verbally aggressive, agitated. Pt denies SI, Endorsing HI towards his biological son who is currently in jail. Pt reports his family is stealing from him. "I have to leave the [place] and go back to the [place]." Encouragement and support provided. Special checks q 15 mins in place for safety, Video monitoring in place. Will continue to monitor.

## 2016-07-13 NOTE — ED Triage Notes (Signed)
Patient from surgery center had a right ureteroscopy retrograde pyelogram to extract a stone.  No stone was found so it was presumed that patient passed it.  Patient commented before the procedure that he felt suicidal so he was brought to TCU via wheelchair.  Patient reports his suicidal thoughts are related to family members stealing from him.  Patient reports he has been admitted to [facility] in the past.  Denies HI, AVH.

## 2016-07-13 NOTE — Anesthesia Procedure Notes (Signed)
Procedure Name: LMA Insertion Date/Time: 07/13/2016 12:05 PM Performed by: Justice Rocher Pre-anesthesia Checklist: Patient identified, Emergency Drugs available, Suction available and Patient being monitored Patient Re-evaluated:Patient Re-evaluated prior to inductionOxygen Delivery Method: Circle system utilized Preoxygenation: Pre-oxygenation with 100% oxygen Intubation Type: IV induction Ventilation: Mask ventilation without difficulty LMA: LMA inserted LMA Size: 4.0 Number of attempts: 1 Airway Equipment and Method: Bite block Placement Confirmation: positive ETCO2 Tube secured with: Tape Dental Injury: Teeth and Oropharynx as per pre-operative assessment

## 2016-07-14 ENCOUNTER — Encounter (HOSPITAL_BASED_OUTPATIENT_CLINIC_OR_DEPARTMENT_OTHER): Payer: Self-pay | Admitting: Urology

## 2016-07-14 ENCOUNTER — Emergency Department (HOSPITAL_COMMUNITY): Payer: Medicare Other

## 2016-07-14 DIAGNOSIS — F4329 Adjustment disorder with other symptoms: Secondary | ICD-10-CM | POA: Diagnosis not present

## 2016-07-14 DIAGNOSIS — F17210 Nicotine dependence, cigarettes, uncomplicated: Secondary | ICD-10-CM | POA: Diagnosis not present

## 2016-07-14 DIAGNOSIS — N132 Hydronephrosis with renal and ureteral calculous obstruction: Secondary | ICD-10-CM | POA: Diagnosis not present

## 2016-07-14 LAB — URINALYSIS, ROUTINE W REFLEX MICROSCOPIC
BACTERIA UA: NONE SEEN
Bilirubin Urine: NEGATIVE
GLUCOSE, UA: NEGATIVE mg/dL
KETONES UR: NEGATIVE mg/dL
LEUKOCYTES UA: NEGATIVE
Nitrite: NEGATIVE
PROTEIN: NEGATIVE mg/dL
SQUAMOUS EPITHELIAL / LPF: NONE SEEN
Specific Gravity, Urine: 1.003 — ABNORMAL LOW (ref 1.005–1.030)
pH: 8 (ref 5.0–8.0)

## 2016-07-14 MED ORDER — TRAMADOL HCL 50 MG PO TABS
50.0000 mg | ORAL_TABLET | Freq: Four times a day (QID) | ORAL | Status: DC
  Administered 2016-07-14: 50 mg via ORAL
  Filled 2016-07-14: qty 1

## 2016-07-14 MED ORDER — TRAMADOL HCL 50 MG PO TABS: 50.0000 mg | ORAL_TABLET | Freq: Four times a day (QID) | ORAL | 0 refills | Status: DC

## 2016-07-14 MED ORDER — WARFARIN SODIUM 7.5 MG PO TABS
7.5000 mg | ORAL_TABLET | Freq: Once | ORAL | Status: DC
  Filled 2016-07-14: qty 1

## 2016-07-14 NOTE — ED Notes (Signed)
Called lab who said that think they can add the urine to yesterday's sample. Pt is not cooperative this morning. He is irritable and belligerent. He is demanding to see the Psychiatrist so that he can leave.

## 2016-07-14 NOTE — ED Notes (Signed)
Pt discharged home. Discharged instructions read to pt who verbalized understanding. All belongings returned to pt who signed for same. Denies SI/HI, is not delusional and not responding to internal stimuli. Escorted pt to the ED exit.    

## 2016-07-14 NOTE — ED Notes (Signed)
Pt refused coumadin because, "I have had blood in my urine and they told me not to take it if I have blood in my urine". He also refused Laxis, "I take that only when my feet swell."

## 2016-07-14 NOTE — Discharge Instructions (Signed)
For your ongoing behavioral health needs, you are advised to follow up with an outpatient therapist.  Contact one of the following practices at your earliest opportunity to ask about scheduling an intake appointment:       [facility] [place]. [address] [place], [place] [address]      [phone]       [facility] [address]      [place], [place] [address]      [phone]       [facility]      [place],  [address]      [phone]

## 2016-07-14 NOTE — Consult Note (Signed)
[facility] Psychiatry Consult   Reason for Consult:  Suicidal comments  Referring Physician:  EDP Patient Identification: [name] MRN:  [id number] Principal Diagnosis: Adjustment disorder with disturbance of emotion Diagnosis:   Patient Active Problem List   Diagnosis Date Noted  . Adjustment disorder with disturbance of emotion [F43.29] 07/14/2016    Priority: High  . Hyperlipemia [E78.5] 07/07/2016  . Arterial occlusion due to stenosis ([place]) [I77.1] 08/21/2015  . Aftercare following surgery of the circulatory system [Z48.812] 08/21/2015  . Current smoker [F17.200] 08/21/2015  . Abdominal aortic aneurysm (AAA) ([place]) [I71.4] 05/02/2015  . MDD (major depressive disorder), single episode, severe , no psychosis ([place]) [F32.2] 02/10/2015  . Major depressive disorder, recurrent, severe without psychotic features ([place]) [F33.2] 02/09/2015  . Limb ischemia [I99.8] 01/23/2015  . Numbness [R20.0] 01/20/2015  . PAD (peripheral artery disease) ([place]) [I73.9] 12/10/2014  . Embolic stroke ([place]) [H41.9] 12/05/2014  . Ischemia of extremity [I99.8] 11/20/2014  . Ischemia of hand [I99.8] 04/05/2014  . Special screening for malignant neoplasms, colon [Z12.11] 03/20/2014  . Warfarin-induced coagulopathy ([place]) [F79.02, I09.735H] 03/20/2014  . Encounter for therapeutic drug monitoring [Z51.81] 03/08/2013  . Headache [R51] 01/29/2013  . Prostate nodule with urinary obstruction [N40.3, N13.8] 12/13/2012  . Routine general medical examination at a health care facility [Z00.00] 12/13/2012  . Depression with anxiety [F41.8] 12/07/2012  . Long term (current) use of anticoagulants [Z79.01] 04/30/2010  . HYPERCHOLESTEROLEMIA-PURE [E78.00] 04/22/2008  . TOBACCO ABUSE [F17.200] 04/22/2008  . HYPERTENSION, BENIGN ESSENTIAL [I10] 04/22/2008  . Atrial fibrillation ([place]) [I48.91] 04/22/2008  . COPD (chronic obstructive pulmonary disease) ([place]) [J44.9] 04/22/2008    Total Time spent with patient: 45  minutes  Subjective:   [name] is a 66 y.o. male patient does not warrant admission.  HPI:  66 yo male presented to the outpatient surgical center for surgery and expressed suicidal comments.  He was sent to the ED after being cleared medically.  Today, he denies suicidal ideations or plan and intent.  He is frustrated with family taking his money, son currently in prison for taking his money in the past.  He is calm and cooperative, educated and receptive to his use of words when he is upset.  No homicidal ideations, hallucinations, or alcohol/drug abuse.  He wants to leave to go to work and plans on moving back to the [place] in August where he has social connections.  Stable for discharge.  Past Psychiatric History: depression  Risk to Self: NOne Risk to Others: None Prior Inpatient Therapy: Prior Inpatient Therapy: Yes Prior Therapy Dates:  [facility] 02/09/2015) Prior Therapy Facilty/Provider(s):  [facility]) Reason for Treatment:  (suicide attempt ) Prior Outpatient Therapy: Prior Outpatient Therapy: No Prior Therapy Dates:  (n/a) Prior Therapy Facilty/Provider(s):  (n/a) Reason for Treatment:  (n/a) Does patient have an ACCT team?: No Does patient have Intensive In-House Services?  : No Does patient have [facility] services? : No Does patient have P4CC services?: No  Past Medical History:  Past Medical History:  Diagnosis Date  . AAA (abdominal aortic aneurysm) ([place])    per last CT 03-26-2016---  3.9cm  . Anticoagulated on Coumadin   . Arthritis of spine ([place])   . Benign localized prostatic hyperplasia with lower urinary tract symptoms (LUTS)   . Chronic atrial fibrillation ([place]) cardiolgoist -- dr Stanford Breed   dx 2009  . Complication of anesthesia    per pt today (07-08-2016) had problem after having anesthesia 12/ 2016 surgery at [facility] "not good" went  to ED ( in epic ED visit 01/ 2017 dx severe episode of major depression disorder and admitted to [facility]  . COPD with emphysema  ([place])   . Dyspnea on exertion   . Full dentures   . GERD (gastroesophageal reflux disease)   . Hematuria   . History of amaurosis fugax    right eye 02-08-2015 -- resolved /  documented in epic possible left eye amaurosis fugax 11/ 2008 (pt denies) / per last MRI show previous bilateral occipital lobe infarcts  . History of concussion    as child  . History of CVA (cerebrovascular accident) 10/ 2016  and per pt Dec 2017 in [place]   neurologist-  dr Leonie Man-- per note silent infarcts on MRI-- multiple acute infarcts bilateral cerebullm, right temporal lobe, and bilateral occipital lobes due to embolic phenomena (atrial fib.)  . History of seizures as a child   . Hyperlipidemia   . Hypertension   . Left arm numbness    occasional numbess post residual axilla arterial occlusion and surgery  . Major depressive disorder    hx severe episode without psychosis 02/2015 w/ homicidal ideation  . Narcotic dependence ([place])   . PAD (peripheral artery disease) ([place])    followed by dr fields-- s/p  left axilla to branchial and left axilla to ulnar bypass graft due to ischemic hand  . Pulmonary nodule    per CT 03-26-2016  right lower lobe  . PVD (peripheral vascular disease) ([place])   . Right ureteral stone   . Senile purpura ([place])   . Weakness of left arm    residual from axilla arterial occlusion and post surgery  . Wears glasses     Past Surgical History:  Procedure Laterality Date  . ARCH AORTOGRAM N/A 04/05/2014   Procedure: ARCH AORTOGRAM, FIRST ORDER CATHETERIZATION LEFT SUBCLAVIAN ARTERY;  Surgeon: Elam Dutch, MD;  Location: [facility] OR;  Service: Vascular;  Laterality: N/A;  . AXILLARY-FEMORAL BYPASS GRAFT Left 04/08/2014   Procedure: LEFT AXILLARY ARTERY TO BRACHIAL ARTERY BYPASS USING NON REVERSE LEFT GREATER SAPHENOUS VEIN ,LIGATION OF LEFT AXILLARY ARTERY ANEURYSM;  Surgeon: Elam Dutch, MD;  Location: [place] [facility];  Service: Vascular;  Laterality: Left;  . BYPASS AXILLA/BRACHIAL  ARTERY Left 01/27/2015   Procedure: LEFT BRACHIAL-ULAR ARTERY BYPASS USING GREATER SAPHENOUS VEIN;  Surgeon: Elam Dutch, MD;  Location: [facility];  Service: Vascular;  Laterality: Left;  . CYSTOSCOPY/URETEROSCOPY/HOLMIUM LASER/STENT PLACEMENT Right 07/13/2016   Procedure: CYSTOSCOPY, RIGHT URETEROSCOPY RETROGRADE PYELOGRAM;  Surgeon: Kathie Rhodes, MD;  Location[facility];  Service: Urology;  Laterality: Right;  . EMBOLECTOMY Left 04/05/2014   Procedure: THROMBO ENDARTERECTOMY OF LEFT BRACHIAL, RADIAL AND ULNAR ARTERY,  Left Radial artery cut down and radial artery thrombectomy.;  Surgeon: Elam Dutch, MD;  Location[facility];  Service: Vascular;  Laterality: Left;  . facial cyst Right    cyst removal.   . PATCH ANGIOPLASTY Left 04/05/2014   Procedure: LEFT ARM VEIN PATCH ANGIOPLASTY;  Surgeon: Elam Dutch, MD;  Location: [facility];  Service: Vascular;  Laterality: Left;  . PERIPHERAL VASCULAR CATHETERIZATION N/A 01/22/2015   Procedure: Aortic Arch Angiography;  Surgeon: Elam Dutch, MD;  Location: [facility];  Service: Cardiovascular;  Laterality: N/A;  . THROMBECTOMY BRACHIAL ARTERY Left 11/20/2014   Procedure: 1.  Thrombectomy Left Axilo-Brachial Bypass  2.  Thromboendarterecotmy of Left Brachial Artery with Fogarty Thrombectomy of Radial and Ulnar Arteries with Dacron patch angioplasty Left Brachial Artery. 3.  Intraoperative  Arteriogram times four.;  Surgeon: Mal Misty, MD;  Location: [facility];  Service: Vascular;  Laterality: Left;  . TRANSTHORACIC ECHOCARDIOGRAM  11/25/2014   moderate concentric LVH, ef 60-65%, unable to evaluation LVDF due to atrial fib/  mild MR/  severe LAE  . VEIN HARVEST Left 04/08/2014   Procedure: LEFT GREATER SAPHENOUS VEIN HARVEST;  Surgeon: Elam Dutch, MD;  Location: [facility];  Service: Vascular;  Laterality: Left;  Marland Kitchen VEIN HARVEST Right 01/27/2015   Procedure: RIGHT GREATER SAPHENOUS VEIN HARVEST;  Surgeon: Elam Dutch, MD;   Location: [facility];  Service: Vascular;  Laterality: Right;   Family History:  Family History  Problem Relation Age of Onset  . Hypertension Mother   . Lung cancer Mother   . Stomach cancer Father        died age 71  . Cancer Sister   . Early death Neg Hx   . Heart disease Neg Hx   . Hyperlipidemia Neg Hx   . Kidney disease Neg Hx   . Stroke Neg Hx    Family Psychiatric  History: unknown Social History:  History  Alcohol Use  . 0.0 oz/week    Comment: occasional      History  Drug Use No    Comment: per pt 07-08-2016 hx drug use stopped 1970's    Social History   Social History  . Marital status: Divorced    Spouse name: N/A  . Number of children: 1  . Years of education: N/A   Occupational History  . 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 Smoking status: Current Every Day Smoker    Packs/day: 2.00    Years: 51.00    Types: Cigarettes  . Smokeless tobacco: Never Used     Comment: per pt 07-08-2016 down to 2ppd from 4 ppd (quit one time for 4 years since started at age 17)  . Alcohol use 0.0 oz/week     Comment: occasional   . Drug use: No     Comment: per pt 07-08-2016 hx drug use stopped 1970's  . Sexual activity: Not Asked   Other Topics Concern  . None   Social History Narrative  . None   Additional Social History:    Allergies:   Allergies  Allergen Reactions  . Oxycodone Itching    Labs:  Results for orders placed or performed during the hospital encounter of 07/13/16 (from the past 48 hour(s))  CBC with Differential     Status: None   Collection Time: 07/13/16  2:28 PM  Result Value Ref Range   WBC 7.0 4.0 - 10.5 K/uL   RBC 4.34 4.22 - 5.81 MIL/uL   Hemoglobin 14.2 13.0 - 17.0 g/dL   HCT 40.6 39.0 - 52.0 %   MCV 93.5 78.0 - 100.0 fL   MCH 32.7 26.0 - 34.0 pg   MCHC 35.0 30.0 - 36.0 g/dL   RDW 13.4 11.5 - 15.5 %   Platelets 190 150 - 400 K/uL   Neutrophils Relative % 77 %   Neutro Abs 5.3 1.7 - 7.7 K/uL    Lymphocytes Relative 22 %   Lymphs Abs 1.6 0.7 - 4.0 K/uL   Monocytes Relative 1 %   Monocytes Absolute 0.1 0.1 - 1.0 K/uL   Eosinophils Relative 0 %   Eosinophils Absolute 0.0 0.0 - 0.7 K/uL   Basophils Relative 0 %   Basophils Absolute 0.0 0.0 - 0.1 K/uL  Salicylate  level     Status: None   Collection Time: 07/13/16  2:28 PM  Result Value Ref Range   Salicylate Lvl <0.8 2.8 - 30.0 mg/dL  Acetaminophen level     Status: Abnormal   Collection Time: 07/13/16  2:28 PM  Result Value Ref Range   Acetaminophen (Tylenol), Serum <10 (L) 10 - 30 ug/mL    Comment:        THERAPEUTIC CONCENTRATIONS VARY SIGNIFICANTLY. A RANGE OF 10-30 ug/mL MAY BE AN EFFECTIVE CONCENTRATION FOR MANY PATIENTS. HOWEVER, SOME ARE BEST TREATED AT CONCENTRATIONS OUTSIDE THIS RANGE. ACETAMINOPHEN CONCENTRATIONS >150 ug/mL AT 4 HOURS AFTER INGESTION AND >50 ug/mL AT 12 HOURS AFTER INGESTION ARE OFTEN ASSOCIATED WITH TOXIC REACTIONS.   Urine rapid drug screen (hosp performed)     Status: Abnormal   Collection Time: 07/13/16  2:28 PM  Result Value Ref Range   Opiates NONE DETECTED NONE DETECTED   Cocaine NONE DETECTED NONE DETECTED   Benzodiazepines POSITIVE (A) NONE DETECTED   Amphetamines NONE DETECTED NONE DETECTED   Tetrahydrocannabinol POSITIVE (A) NONE DETECTED   Barbiturates NONE DETECTED NONE DETECTED    Comment:        DRUG SCREEN FOR MEDICAL PURPOSES ONLY.  IF CONFIRMATION IS NEEDED FOR ANY PURPOSE, NOTIFY LAB WITHIN 5 DAYS.        LOWEST DETECTABLE LIMITS FOR URINE DRUG SCREEN Drug Class       Cutoff (ng/mL) Amphetamine      1000 Barbiturate      200 Benzodiazepine   676 Tricyclics       195 Opiates          300 Cocaine          300 THC              50   Urinalysis, Routine w reflex microscopic     Status: Abnormal   Collection Time: 07/13/16  2:28 PM  Result Value Ref Range   Color, Urine STRAW (A) YELLOW   APPearance CLEAR CLEAR   Specific Gravity, Urine 1.003 (L) 1.005 - 1.030    pH 8.0 5.0 - 8.0   Glucose, UA NEGATIVE NEGATIVE mg/dL   Hgb urine dipstick LARGE (A) NEGATIVE   Bilirubin Urine NEGATIVE NEGATIVE   Ketones, ur NEGATIVE NEGATIVE mg/dL   Protein, ur NEGATIVE NEGATIVE mg/dL   Nitrite NEGATIVE NEGATIVE   Leukocytes, UA NEGATIVE NEGATIVE   RBC / HPF 0-5 0 - 5 RBC/hpf   WBC, UA 0-5 0 - 5 WBC/hpf   Bacteria, UA NONE SEEN NONE SEEN   Squamous Epithelial / LPF NONE SEEN NONE SEEN  Protime-INR     Status: Abnormal   Collection Time: 07/13/16  5:03 PM  Result Value Ref Range   Prothrombin Time 20.2 (H) 11.4 - 15.2 seconds   INR 1.70     Current Facility-Administered Medications  Medication Dose Route Frequency Provider Last Rate Last Dose  . acetaminophen (TYLENOL) tablet 500 mg  500 mg Oral Q6H PRN Long, Wonda Olds, MD      . albuterol (PROVENTIL HFA;VENTOLIN HFA) 108 (90 Base) MCG/ACT inhaler 2 puff  2 puff Inhalation Q6H PRN Long, Wonda Olds, MD      . alum & mag hydroxide-simeth (MAALOX/MYLANTA) 200-200-20 MG/5ML suspension 30 mL  30 mL Oral Q6H PRN Long, Wonda Olds, MD      . atorvastatin (LIPITOR) tablet 40 mg  40 mg Oral q1800 Long, Wonda Olds, MD   40 mg at 07/13/16 1710  . diltiazem (  TIAZAC) 24 hr capsule 240 mg  240 mg Oral Daily Long, Wonda Olds, MD   240 mg at 07/14/16 0831  . furosemide (LASIX) tablet 20 mg  20 mg Oral Daily Long, Wonda Olds, MD   20 mg at 07/13/16 1711  . gabapentin (NEURONTIN) capsule 100 mg  100 mg Oral QHS Long, Wonda Olds, MD   100 mg at 07/14/16 0215  . metoprolol tartrate (LOPRESSOR) tablet 100 mg  100 mg Oral BID Long, Wonda Olds, MD   100 mg at 07/14/16 1001  . nicotine (NICODERM CQ - dosed in mg/24 hours) patch 21 mg  21 mg Transdermal Daily Long, Wonda Olds, MD      . ondansetron [facility]) tablet 4 mg  4 mg Oral Q8H PRN Long, Wonda Olds, MD      . traMADol Veatrice Bourbon) tablet 50 mg  50 mg Oral Q6H Patrecia Pour, NP   50 mg at 07/14/16 1001  . warfarin (COUMADIN) tablet 7.5 mg  7.5 mg Oral Once Polly Cobia, RPH      . Warfarin -  Pharmacist Dosing Inpatient   Does not apply q1800 Luiz Ochoa [facility]       Current Outpatient Prescriptions  Medication Sig Dispense Refill  . acetaminophen (TYLENOL) 500 MG tablet Take 500 mg by mouth every 6 (six) hours as needed for mild pain.     Marland Kitchen albuterol (PROVENTIL HFA;VENTOLIN HFA) 108 (90 Base) MCG/ACT inhaler Inhale 2 puffs into the lungs every 6 (six) hours as needed for wheezing or shortness of breath.    . ALPRAZolam (XANAX) 0.5 MG tablet Take 0.5 mg by mouth at bedtime as needed for anxiety or sleep.     Marland Kitchen atorvastatin (LIPITOR) 40 MG tablet Take 1 tablet (40 mg total) by mouth daily. For high cholesterol 90 tablet 3  . calcium carbonate (TUMS - DOSED IN MG ELEMENTAL CALCIUM) 500 MG chewable tablet Chew 1 tablet by mouth 2 (two) times daily with a meal.     . diltiazem (TIAZAC) 240 MG 24 hr capsule TAKE ONE (1) CAPSULE BY MOUTH EACH DAY (Patient taking differently: TAKE ONE (1) CAPSULE BY MOUTH EACH DAY--- takes in am) 90 capsule 1  . furosemide (LASIX) 20 MG tablet Take 20 mg by mouth daily.     Marland Kitchen gabapentin (NEURONTIN) 100 MG capsule Take 1 capsule (100 mg total) by mouth at bedtime. For agitation 30 capsule 0  . metoprolol (LOPRESSOR) 100 MG tablet TAKE ONE (1) TABLET BY MOUTH TWO (2) TIMES DAILY 60 tablet 0  . traMADol (ULTRAM) 50 MG tablet Take 50-100 mg by mouth every 6 (six) hours as needed for moderate pain.     Marland Kitchen warfarin (COUMADIN) 5 MG tablet Take 1 tablet (5 mg total) by mouth daily at 6 PM. Take as directed.  Dose may vary pending INR: For blood clot prevention    . traMADol (ULTRAM) 50 MG tablet Take 1 tablet (50 mg total) by mouth every 6 (six) hours. 30 tablet 0    Musculoskeletal: Strength & Muscle Tone: within normal limits Gait & Station: normal Patient leans: N/A  Psychiatric Specialty Exam: Physical Exam  Constitutional: He is oriented to person, place, and time. He appears well-developed and well-nourished.  HENT:  Head: Normocephalic.  Neck: Normal  range of motion.  Respiratory: Effort normal.  Musculoskeletal: Normal range of motion.  Neurological: He is alert and oriented to person, place, and time.  Psychiatric: He has a normal mood and affect. His speech  is normal and behavior is normal. Judgment and thought content normal. Cognition and memory are normal.    Review of Systems  All other systems reviewed and are negative.   Blood pressure 117/86, pulse 67, temperature 98.1 F (36.7 C), temperature source Oral, resp. rate 18, SpO2 98 %.There is no height or weight on file to calculate BMI.  General Appearance: Casual  Eye Contact:  Good  Speech:  Normal Rate  Volume:  Normal  Mood:  Euthymic  Affect:  Congruent  Thought Process:  Coherent and Descriptions of Associations: Intact  Orientation:  Full (Time, Place, and Person)  Thought Content:  WDL and Logical  Suicidal Thoughts:  No  Homicidal Thoughts:  No  Memory:  Immediate;   Good Recent;   Good Remote;   Good  Judgement:  Fair  Insight:  Fair  Psychomotor Activity:  Normal  Concentration:  Concentration: Good and Attention Span: Good  Recall:  Good  Fund of Knowledge:  Good  Language:  Good  Akathisia:  No  Handed:  Right  AIMS (if indicated):     Assets:  Communication Skills Desire for Improvement Financial Resources/Insurance Housing Intimacy Leisure Time Physical Health Resilience Social Support Talents/Skills Transportation Vocational/Educational  ADL's:  Intact  Cognition:  WNL  Sleep:        Treatment Plan Summary: Daily contact with patient to assess and evaluate symptoms and progress in treatment, Medication management and Plan adjustment disorder with disturbance of emotions and conduct:  -Crisis stabilization -Medication management:  Medical medications restarted  -Individual counseling  Disposition: No evidence of imminent risk to self or others at present.    Waylan Boga, NP 07/14/2016 12:15 PM  Patient seen face-to-face for  psychiatric evaluation, chart reviewed and case discussed with the physician extender and developed treatment plan. Reviewed the information documented and agree with the treatment plan. Corena Pilgrim, MD

## 2016-07-14 NOTE — [facility] Suicide Risk Assessment (Signed)
Suicide Risk Assessment  Discharge Assessment   [facility] Discharge Suicide Risk Assessment   Principal Problem: Adjustment disorder with disturbance of emotion Discharge Diagnoses:  Patient Active Problem List   Diagnosis Date Noted  . Adjustment disorder with disturbance of emotion [F43.29] 07/14/2016    Priority: High  . Hyperlipemia [E78.5] 07/07/2016  . Arterial occlusion due to stenosis ([place]) [I77.1] 08/21/2015  . Aftercare following surgery of the circulatory system [Z48.812] 08/21/2015  . Current smoker [F17.200] 08/21/2015  . Abdominal aortic aneurysm (AAA) ([place]) [I71.4] 05/02/2015  . MDD (major depressive disorder), single episode, severe , no psychosis ([place]) [F32.2] 02/10/2015  . Major depressive disorder, recurrent, severe without psychotic features ([place]) [F33.2] 02/09/2015  . Limb ischemia [I99.8] 01/23/2015  . Numbness [R20.0] 01/20/2015  . PAD (peripheral artery disease) ([place]) [I73.9] 12/10/2014  . Embolic stroke ([place]) [W38.8] 12/05/2014  . Ischemia of extremity [I99.8] 11/20/2014  . Ischemia of hand [I99.8] 04/05/2014  . Special screening for malignant neoplasms, colon [Z12.11] 03/20/2014  . Warfarin-induced coagulopathy ([place]) [E28.00, L49.179X] 03/20/2014  . Encounter for therapeutic drug monitoring [Z51.81] 03/08/2013  . Headache [R51] 01/29/2013  . Prostate nodule with urinary obstruction [N40.3, N13.8] 12/13/2012  . Routine general medical examination at a health care facility [Z00.00] 12/13/2012  . Depression with anxiety [F41.8] 12/07/2012  . Long term (current) use of anticoagulants [Z79.01] 04/30/2010  . HYPERCHOLESTEROLEMIA-PURE [E78.00] 04/22/2008  . TOBACCO ABUSE [F17.200] 04/22/2008  . HYPERTENSION, BENIGN ESSENTIAL [I10] 04/22/2008  . Atrial fibrillation ([place]) [I48.91] 04/22/2008  . COPD (chronic obstructive pulmonary disease) ([place]) [J44.9] 04/22/2008    Total Time spent with patient: 45 minutes  Musculoskeletal: Strength & Muscle Tone: within normal  limits Gait & Station: normal Patient leans: N/A  Psychiatric Specialty Exam: Physical Exam  Constitutional: He is oriented to person, place, and time. He appears well-developed and well-nourished.  HENT:  Head: Normocephalic.  Neck: Normal range of motion.  Respiratory: Effort normal.  Musculoskeletal: Normal range of motion.  Neurological: He is alert and oriented to person, place, and time.  Psychiatric: He has a normal mood and affect. His speech is normal and behavior is normal. Judgment and thought content normal. Cognition and memory are normal.    Review of Systems  All other systems reviewed and are negative.   Blood pressure 117/86, pulse 67, temperature 98.1 F (36.7 C), temperature source Oral, resp. rate 18, SpO2 98 %.There is no height or weight on file to calculate BMI.  General Appearance: Casual  Eye Contact:  Good  Speech:  Normal Rate  Volume:  Normal  Mood:  Euthymic  Affect:  Congruent  Thought Process:  Coherent and Descriptions of Associations: Intact  Orientation:  Full (Time, Place, and Person)  Thought Content:  WDL and Logical  Suicidal Thoughts:  No  Homicidal Thoughts:  No  Memory:  Immediate;   Good Recent;   Good Remote;   Good  Judgement:  Fair  Insight:  Fair  Psychomotor Activity:  Normal  Concentration:  Concentration: Good and Attention Span: Good  Recall:  Good  Fund of Knowledge:  Good  Language:  Good  Akathisia:  No  Handed:  Right  AIMS (if indicated):     Assets:  Communication Skills Desire for Improvement Financial Resources/Insurance Housing Intimacy Leisure Time Physical Health Resilience Social Support Talents/Skills Transportation Vocational/Educational  ADL's:  Intact  Cognition:  WNL  Sleep:       Mental Status Per Nursing Assessment::   On Admission:   suicidal c omments  Demographic Factors:  Male, Caucasian and Living alone  Loss Factors: NA  Historical Factors: NA  Risk Reduction Factors:    Sense of responsibility to family and Positive social support  Continued Clinical Symptoms:  NOne  Cognitive Features That Contribute To Risk:  None    Suicide Risk:  Minimal: No identifiable suicidal ideation.  Patients presenting with no risk factors but with morbid ruminations; may be classified as minimal risk based on the severity of the depressive symptoms    Plan Of Care/Follow-up recommendations:  Activity:  as tolerated Diet:  heart healthy diet  Jamarie Mussa, NP 07/14/2016, 12:28 PM

## 2016-07-14 NOTE — ED Notes (Signed)
Patient refused coumadin 7,5 mg. Patient educated on the benefits and risk of not taking coumadin. Patient continues to refuse.

## 2016-07-14 NOTE — BH Assessment (Signed)
[facility] Assessment Progress Note  Per Corena Pilgrim, MD, this pt does not require psychiatric hospitalization at this time.  Pt presents under IVC initiated by EDP Nanda Quinton, MD, which Dr Darleene Cleaver has rescinded.  Pt is to be discharged from [facility] with referral information for area therapists.  This has been included in pt's discharge instructions.  Pt's nurse, Diane, has been notified.  Jalene Mullet, [place] Triage Specialist [phone]

## 2016-07-14 NOTE — Progress Notes (Signed)
[facility] [place] for Warfarin Indication: atrial fibrillation, history of stroke  Allergies  Allergen Reactions  . Oxycodone Itching    Patient Measurements:   Height: 5'8.5" Weight: 76.2 kg  Vital Signs: Temp: 97.8 F (36.6 C) (06/06 0210) Temp Source: Oral (06/06 0210) BP: 140/90 (06/06 0210) Pulse Rate: 79 (06/06 0210)  Labs:  Recent Labs  07/13/16 1052 07/13/16 1428 07/13/16 1703  HGB 17.3* 14.2  --   HCT 51.0 40.6  --   PLT  --  190  --   LABPROT  --   --  20.2*  INR  --   --  1.70  CREATININE 0.80  --   --     Estimated Creatinine Clearance: 89.4 mL/min (by C-G formula based on SCr of 0.8 mg/dL).   Assessment: 47 y/oM with PMH of AAA, COPD, PAD, HTN, HLD, history of kidney stones, a-fib, stroke on chronic warfarin therapy who presented to [facility] on 07/14/2016 for cystoscopy with right retrograde pyelogram and right ureteroscopy. No stone was found, so it was assumed patient passed the stone. During intake for procedure, patient endorsed suicidal ideations, so was transferred to [facility] ED after procedure. Pharmacy consulted to manage warfarin therapy while patient in the hospital.   Baseline INR subtherapeutic  Prior anticoagulation: warfarin 5 mg daily, last dose 6/4  Significant events: 6/5: patient refused warfarin dose  Today, 07/14/2016:  CBC: wnl  INR subtherapeutic from yesterday, no warfarin given  Major drug interactions: none, received Cipro x 1 perioperatively  No bleeding issues per nursing  Eating 100% of meals  Goal of Therapy: INR 2-3  Plan:  Re-attempt warfarin 7.5 mg PO today. If patient refuses again, will need to discuss [facility] plans with MD  Daily INR  CBC at least q72 hr while on warfarin  Monitor for signs of bleeding or thrombosis   Reuel Boom, PharmD Pager: [phone] 07/14/2016, 8:23 AM

## 2016-07-15 ENCOUNTER — Ambulatory Visit: Payer: Self-pay | Admitting: Physician Assistant

## 2016-07-17 ENCOUNTER — Inpatient Hospital Stay (HOSPITAL_COMMUNITY): Payer: Medicare Other | Admitting: Registered Nurse

## 2016-07-17 ENCOUNTER — Inpatient Hospital Stay (HOSPITAL_COMMUNITY)
Admission: EM | Admit: 2016-07-17 | Discharge: 2016-07-18 | DRG: 669 | Disposition: A | Discharge status: Home / Self Care | Payer: Medicare Other | Attending: Internal Medicine | Admitting: Internal Medicine

## 2016-07-17 ENCOUNTER — Other Ambulatory Visit: Payer: Self-pay

## 2016-07-17 ENCOUNTER — Encounter (HOSPITAL_COMMUNITY): Payer: Self-pay | Admitting: *Deleted

## 2016-07-17 ENCOUNTER — Inpatient Hospital Stay (HOSPITAL_COMMUNITY): Payer: Medicare Other

## 2016-07-17 ENCOUNTER — Encounter (HOSPITAL_COMMUNITY)
Admission: EM | Disposition: A | Discharge status: Home / Self Care | Payer: Self-pay | Source: Home / Self Care | Attending: Family Medicine

## 2016-07-17 ENCOUNTER — Emergency Department (HOSPITAL_COMMUNITY): Payer: Medicare Other

## 2016-07-17 DIAGNOSIS — E785 Hyperlipidemia, unspecified: Secondary | ICD-10-CM | POA: Diagnosis present

## 2016-07-17 DIAGNOSIS — F17210 Nicotine dependence, cigarettes, uncomplicated: Secondary | ICD-10-CM | POA: Diagnosis present

## 2016-07-17 DIAGNOSIS — N132 Hydronephrosis with renal and ureteral calculous obstruction: Secondary | ICD-10-CM | POA: Diagnosis present

## 2016-07-17 DIAGNOSIS — I739 Peripheral vascular disease, unspecified: Secondary | ICD-10-CM | POA: Diagnosis present

## 2016-07-17 DIAGNOSIS — Z801 Family history of malignant neoplasm of trachea, bronchus and lung: Secondary | ICD-10-CM | POA: Diagnosis not present

## 2016-07-17 DIAGNOSIS — Z8249 Family history of ischemic heart disease and other diseases of the circulatory system: Secondary | ICD-10-CM | POA: Diagnosis not present

## 2016-07-17 DIAGNOSIS — R944 Abnormal results of kidney function studies: Secondary | ICD-10-CM | POA: Diagnosis not present

## 2016-07-17 DIAGNOSIS — R079 Chest pain, unspecified: Secondary | ICD-10-CM | POA: Diagnosis not present

## 2016-07-17 DIAGNOSIS — N201 Calculus of ureter: Secondary | ICD-10-CM

## 2016-07-17 DIAGNOSIS — J449 Chronic obstructive pulmonary disease, unspecified: Secondary | ICD-10-CM | POA: Diagnosis present

## 2016-07-17 DIAGNOSIS — F329 Major depressive disorder, single episode, unspecified: Secondary | ICD-10-CM | POA: Diagnosis present

## 2016-07-17 DIAGNOSIS — Z800 Family history of malignant neoplasm of digestive organs: Secondary | ICD-10-CM | POA: Diagnosis not present

## 2016-07-17 DIAGNOSIS — I10 Essential (primary) hypertension: Secondary | ICD-10-CM | POA: Diagnosis present

## 2016-07-17 DIAGNOSIS — F17200 Nicotine dependence, unspecified, uncomplicated: Secondary | ICD-10-CM | POA: Diagnosis present

## 2016-07-17 DIAGNOSIS — Z7901 Long term (current) use of anticoagulants: Secondary | ICD-10-CM | POA: Diagnosis not present

## 2016-07-17 DIAGNOSIS — F4322 Adjustment disorder with anxiety: Secondary | ICD-10-CM | POA: Diagnosis present

## 2016-07-17 DIAGNOSIS — R778 Other specified abnormalities of plasma proteins: Secondary | ICD-10-CM | POA: Diagnosis present

## 2016-07-17 DIAGNOSIS — R1031 Right lower quadrant pain: Secondary | ICD-10-CM | POA: Diagnosis not present

## 2016-07-17 DIAGNOSIS — Z01810 Encounter for preprocedural cardiovascular examination: Secondary | ICD-10-CM

## 2016-07-17 DIAGNOSIS — Z8673 Personal history of transient ischemic attack (TIA), and cerebral infarction without residual deficits: Secondary | ICD-10-CM | POA: Diagnosis not present

## 2016-07-17 DIAGNOSIS — R000 Tachycardia, unspecified: Secondary | ICD-10-CM | POA: Diagnosis not present

## 2016-07-17 DIAGNOSIS — D72829 Elevated white blood cell count, unspecified: Secondary | ICD-10-CM | POA: Diagnosis not present

## 2016-07-17 DIAGNOSIS — I4891 Unspecified atrial fibrillation: Secondary | ICD-10-CM | POA: Diagnosis not present

## 2016-07-17 DIAGNOSIS — Z87442 Personal history of urinary calculi: Secondary | ICD-10-CM | POA: Diagnosis not present

## 2016-07-17 DIAGNOSIS — R7989 Other specified abnormal findings of blood chemistry: Secondary | ICD-10-CM

## 2016-07-17 DIAGNOSIS — N1330 Unspecified hydronephrosis: Secondary | ICD-10-CM | POA: Diagnosis present

## 2016-07-17 DIAGNOSIS — N179 Acute kidney failure, unspecified: Secondary | ICD-10-CM | POA: Diagnosis present

## 2016-07-17 DIAGNOSIS — F4329 Adjustment disorder with other symptoms: Secondary | ICD-10-CM | POA: Diagnosis present

## 2016-07-17 DIAGNOSIS — K219 Gastro-esophageal reflux disease without esophagitis: Secondary | ICD-10-CM | POA: Diagnosis present

## 2016-07-17 DIAGNOSIS — R0602 Shortness of breath: Secondary | ICD-10-CM | POA: Diagnosis not present

## 2016-07-17 DIAGNOSIS — Z466 Encounter for fitting and adjustment of urinary device: Secondary | ICD-10-CM | POA: Diagnosis not present

## 2016-07-17 DIAGNOSIS — N200 Calculus of kidney: Secondary | ICD-10-CM

## 2016-07-17 DIAGNOSIS — D72828 Other elevated white blood cell count: Secondary | ICD-10-CM | POA: Diagnosis present

## 2016-07-17 HISTORY — PX: CYSTOSCOPY WITH RETROGRADE PYELOGRAM, URETEROSCOPY AND STENT PLACEMENT: SHX5789

## 2016-07-17 LAB — URINALYSIS, ROUTINE W REFLEX MICROSCOPIC
Bacteria, UA: NONE SEEN
Bilirubin Urine: NEGATIVE
GLUCOSE, UA: NEGATIVE mg/dL
Ketones, ur: NEGATIVE mg/dL
NITRITE: NEGATIVE
PROTEIN: 100 mg/dL — AB
SPECIFIC GRAVITY, URINE: 1.027 (ref 1.005–1.030)
Squamous Epithelial / LPF: NONE SEEN
pH: 5 (ref 5.0–8.0)

## 2016-07-17 LAB — CBC WITH DIFFERENTIAL/PLATELET
BASOS ABS: 0 10*3/uL (ref 0.0–0.1)
Basophils Relative: 0 %
EOS PCT: 0 %
Eosinophils Absolute: 0 10*3/uL (ref 0.0–0.7)
HEMATOCRIT: 52.5 % — AB (ref 39.0–52.0)
Hemoglobin: 18.8 g/dL — ABNORMAL HIGH (ref 13.0–17.0)
LYMPHS PCT: 10 %
Lymphs Abs: 2.2 10*3/uL (ref 0.7–4.0)
MCH: 32.9 pg (ref 26.0–34.0)
MCHC: 35.8 g/dL (ref 30.0–36.0)
MCV: 91.9 fL (ref 78.0–100.0)
MONO ABS: 2 10*3/uL — AB (ref 0.1–1.0)
Monocytes Relative: 9 %
NEUTROS ABS: 19 10*3/uL — AB (ref 1.7–7.7)
Neutrophils Relative %: 81 %
Platelets: 196 10*3/uL (ref 150–400)
RBC: 5.71 MIL/uL (ref 4.22–5.81)
RDW: 13.3 % (ref 11.5–15.5)
WBC: 23.2 10*3/uL — AB (ref 4.0–10.5)

## 2016-07-17 LAB — I-STAT BETA HCG BLOOD, ED (MC, WL, AP ONLY)

## 2016-07-17 LAB — COMPREHENSIVE METABOLIC PANEL
ALT: 15 U/L — AB (ref 17–63)
AST: 17 U/L (ref 15–41)
Albumin: 3.6 g/dL (ref 3.5–5.0)
Alkaline Phosphatase: 69 U/L (ref 38–126)
Anion gap: 13 (ref 5–15)
BILIRUBIN TOTAL: 2.9 mg/dL — AB (ref 0.3–1.2)
BUN: 25 mg/dL — AB (ref 6–20)
CALCIUM: 9.3 mg/dL (ref 8.9–10.3)
CO2: 22 mmol/L (ref 22–32)
Chloride: 99 mmol/L — ABNORMAL LOW (ref 101–111)
Creatinine, Ser: 1.76 mg/dL — ABNORMAL HIGH (ref 0.61–1.24)
GFR, EST AFRICAN AMERICAN: 45 mL/min — AB (ref >60)
GFR, EST NON AFRICAN AMERICAN: 39 mL/min — AB (ref >60)
Glucose, Bld: 134 mg/dL — ABNORMAL HIGH (ref 65–99)
Potassium: 4.3 mmol/L (ref 3.5–5.1)
Sodium: 134 mmol/L — ABNORMAL LOW (ref 135–145)
TOTAL PROTEIN: 7.1 g/dL (ref 6.5–8.1)

## 2016-07-17 LAB — I-STAT CG4 LACTIC ACID, ED: Lactic Acid, Venous: 1.13 mmol/L (ref 0.5–1.9)

## 2016-07-17 LAB — TROPONIN I
Troponin I: 0.07 ng/mL (ref <0.03)
Troponin I: 0.1 ng/mL (ref <0.03)

## 2016-07-17 LAB — PROTIME-INR
INR: 1.53
Prothrombin Time: 18.6 seconds — ABNORMAL HIGH (ref 11.4–15.2)

## 2016-07-17 LAB — MRSA PCR SCREENING: MRSA BY PCR: NEGATIVE

## 2016-07-17 SURGERY — CYSTOURETEROSCOPY, WITH RETROGRADE PYELOGRAM AND STENT INSERTION
Anesthesia: General | Laterality: Right

## 2016-07-17 MED ORDER — ONDANSETRON HCL 4 MG/2ML IJ SOLN
INTRAMUSCULAR | Status: DC | PRN
  Administered 2016-07-17: 4 mg via INTRAVENOUS

## 2016-07-17 MED ORDER — LACTATED RINGERS IV SOLN
INTRAVENOUS | Status: DC | PRN
  Administered 2016-07-17: 21:00:00 via INTRAVENOUS

## 2016-07-17 MED ORDER — MIDAZOLAM HCL 5 MG/5ML IJ SOLN
INTRAMUSCULAR | Status: DC | PRN
  Administered 2016-07-17: 2 mg via INTRAVENOUS

## 2016-07-17 MED ORDER — PROPOFOL 10 MG/ML IV BOLUS
INTRAVENOUS | Status: DC | PRN
  Administered 2016-07-17: 100 mg via INTRAVENOUS

## 2016-07-17 MED ORDER — DILTIAZEM LOAD VIA INFUSION
20.0000 mg | Freq: Once | INTRAVENOUS | Status: AC
  Administered 2016-07-17: 20 mg via INTRAVENOUS
  Filled 2016-07-17: qty 20

## 2016-07-17 MED ORDER — SODIUM CHLORIDE 0.9 % IR SOLN
Status: DC | PRN
  Administered 2016-07-17 (×2): 1000 mL via INTRAVESICAL

## 2016-07-17 MED ORDER — CIPROFLOXACIN IN D5W 400 MG/200ML IV SOLN
INTRAVENOUS | Status: AC
  Filled 2016-07-17: qty 200

## 2016-07-17 MED ORDER — MORPHINE SULFATE (PF) 4 MG/ML IV SOLN
4.0000 mg | Freq: Once | INTRAVENOUS | Status: AC
  Administered 2016-07-17: 4 mg via INTRAVENOUS
  Filled 2016-07-17: qty 1

## 2016-07-17 MED ORDER — HYDROMORPHONE HCL 1 MG/ML IJ SOLN
1.0000 mg | Freq: Once | INTRAMUSCULAR | Status: AC
  Administered 2016-07-17: 1 mg via INTRAVENOUS
  Filled 2016-07-17: qty 1

## 2016-07-17 MED ORDER — PROMETHAZINE HCL 25 MG/ML IJ SOLN: 6.2500 mg | INTRAMUSCULAR | Status: DC | PRN

## 2016-07-17 MED ORDER — SODIUM CHLORIDE 0.9 % IV SOLN
INTRAVENOUS | Status: DC
  Administered 2016-07-17: 125 mL/h via INTRAVENOUS
  Administered 2016-07-17 – 2016-07-18 (×2): via INTRAVENOUS

## 2016-07-17 MED ORDER — CIPROFLOXACIN IN D5W 400 MG/200ML IV SOLN
400.0000 mg | Freq: Once | INTRAVENOUS | Status: AC
  Administered 2016-07-17 (×2): 400 mg via INTRAVENOUS
  Filled 2016-07-17: qty 200

## 2016-07-17 MED ORDER — MORPHINE SULFATE (PF) 4 MG/ML IV SOLN
2.0000 mg | INTRAVENOUS | Status: DC | PRN
  Filled 2016-07-17: qty 1

## 2016-07-17 MED ORDER — DEXAMETHASONE SODIUM PHOSPHATE 10 MG/ML IJ SOLN
INTRAMUSCULAR | Status: AC
  Filled 2016-07-17: qty 1

## 2016-07-17 MED ORDER — ACETAMINOPHEN 325 MG PO TABS: 650.0000 mg | ORAL_TABLET | ORAL | Status: DC | PRN

## 2016-07-17 MED ORDER — LORAZEPAM 2 MG/ML IJ SOLN
0.5000 mg | Freq: Once | INTRAMUSCULAR | Status: AC
  Administered 2016-07-17: 0.5 mg via INTRAVENOUS
  Filled 2016-07-17: qty 1

## 2016-07-17 MED ORDER — MEPERIDINE HCL 50 MG/ML IJ SOLN: 6.2500 mg | INTRAMUSCULAR | Status: DC | PRN

## 2016-07-17 MED ORDER — PHENYLEPHRINE 40 MCG/ML (10ML) SYRINGE FOR IV PUSH (FOR BLOOD PRESSURE SUPPORT)
PREFILLED_SYRINGE | INTRAVENOUS | Status: AC
  Filled 2016-07-17: qty 10

## 2016-07-17 MED ORDER — ONDANSETRON HCL 4 MG/2ML IJ SOLN
4.0000 mg | Freq: Once | INTRAMUSCULAR | Status: AC
  Administered 2016-07-17: 4 mg via INTRAVENOUS
  Filled 2016-07-17: qty 2

## 2016-07-17 MED ORDER — FENTANYL CITRATE (PF) 100 MCG/2ML IJ SOLN
INTRAMUSCULAR | Status: DC | PRN
  Administered 2016-07-17: 100 ug via INTRAVENOUS

## 2016-07-17 MED ORDER — ONDANSETRON HCL 4 MG/2ML IJ SOLN
4.0000 mg | Freq: Four times a day (QID) | INTRAMUSCULAR | Status: DC | PRN
  Administered 2016-07-17: 4 mg via INTRAVENOUS
  Filled 2016-07-17: qty 2

## 2016-07-17 MED ORDER — ACETAMINOPHEN 10 MG/ML IV SOLN
INTRAVENOUS | Status: AC
  Filled 2016-07-17: qty 100

## 2016-07-17 MED ORDER — MIDAZOLAM HCL 2 MG/2ML IJ SOLN
INTRAMUSCULAR | Status: AC
  Filled 2016-07-17: qty 2

## 2016-07-17 MED ORDER — DILTIAZEM HCL 100 MG IV SOLR
5.0000 mg/h | INTRAVENOUS | Status: DC
Start: 2016-07-17 — End: 2016-07-18
  Administered 2016-07-17: 15 mg/h via INTRAVENOUS
  Administered 2016-07-17: 10 mg/h via INTRAVENOUS
  Administered 2016-07-17 – 2016-07-18 (×2): 5 mg/h via INTRAVENOUS
  Filled 2016-07-17 (×4): qty 100

## 2016-07-17 MED ORDER — METOPROLOL TARTRATE 5 MG/5ML IV SOLN
5.0000 mg | INTRAVENOUS | Status: DC | PRN
  Administered 2016-07-17: 5 mg via INTRAVENOUS

## 2016-07-17 MED ORDER — DEXTROSE 5 % IV SOLN
INTRAVENOUS | Status: DC | PRN
  Administered 2016-07-17: 25 ug/min via INTRAVENOUS

## 2016-07-17 MED ORDER — SODIUM CHLORIDE 0.9 % IV SOLN
INTRAVENOUS | Status: DC | PRN
  Administered 2016-07-17: 16 mL

## 2016-07-17 MED ORDER — ACETAMINOPHEN 10 MG/ML IV SOLN
INTRAVENOUS | Status: DC | PRN
  Administered 2016-07-17: 1000 mg via INTRAVENOUS

## 2016-07-17 MED ORDER — FENTANYL CITRATE (PF) 250 MCG/5ML IJ SOLN
INTRAMUSCULAR | Status: AC
  Filled 2016-07-17: qty 5

## 2016-07-17 MED ORDER — METOPROLOL TARTRATE 5 MG/5ML IV SOLN
INTRAVENOUS | Status: AC
  Administered 2016-07-17: 5 mg via INTRAVENOUS
  Filled 2016-07-17: qty 5

## 2016-07-17 MED ORDER — HYDROMORPHONE HCL 1 MG/ML IJ SOLN: 0.2500 mg | INTRAMUSCULAR | Status: DC | PRN

## 2016-07-17 MED ORDER — SODIUM CHLORIDE 0.9 % IR SOLN
Status: DC | PRN
  Administered 2016-07-17: 3000 mL via INTRAVESICAL

## 2016-07-17 MED ORDER — DEXTROSE 5 % IV SOLN
1.0000 g | INTRAVENOUS | Status: DC
  Administered 2016-07-17: 1 g via INTRAVENOUS
  Filled 2016-07-17 (×2): qty 10

## 2016-07-17 MED ORDER — DEXTROSE 5 % IV SOLN
INTRAVENOUS | Status: AC
  Filled 2016-07-17: qty 10

## 2016-07-17 MED ORDER — PHENYLEPHRINE HCL 10 MG/ML IJ SOLN
INTRAMUSCULAR | Status: DC | PRN
  Administered 2016-07-17 (×2): 80 ug via INTRAVENOUS
  Administered 2016-07-17: 120 ug via INTRAVENOUS
  Administered 2016-07-17: 80 ug via INTRAVENOUS

## 2016-07-17 MED ORDER — PROPOFOL 10 MG/ML IV BOLUS
INTRAVENOUS | Status: AC
  Filled 2016-07-17: qty 20

## 2016-07-17 MED ORDER — ONDANSETRON HCL 4 MG/2ML IJ SOLN
INTRAMUSCULAR | Status: AC
  Filled 2016-07-17: qty 2

## 2016-07-17 MED ORDER — LIDOCAINE 2% (20 MG/ML) 5 ML SYRINGE
INTRAMUSCULAR | Status: DC | PRN
  Administered 2016-07-17: 75 mg via INTRAVENOUS

## 2016-07-17 SURGICAL SUPPLY — 21 items
BAG URO CATCHER STRL LF (MISCELLANEOUS) ×3 IMPLANT
BASKET ZERO TIP NITINOL 2.4FR (BASKET) ×3 IMPLANT
CATH INTERMIT  6FR 70CM (CATHETERS) ×3 IMPLANT
CATH URET 5FR 28IN CONE TIP (BALLOONS) ×2
CATH URET 5FR 70CM CONE TIP (BALLOONS) ×1 IMPLANT
CATH URET WHISTLE 6FR (CATHETERS) ×3 IMPLANT
CLOTH BEACON ORANGE TIMEOUT ST (SAFETY) ×3 IMPLANT
COVER SURGICAL LIGHT HANDLE (MISCELLANEOUS) ×3 IMPLANT
FIBER LASER TRAC TIP (UROLOGICAL SUPPLIES) ×3 IMPLANT
GLOVE BIO SURGEON STRL SZ7.5 (GLOVE) ×3 IMPLANT
GOWN STRL REUS W/TWL XL LVL3 (GOWN DISPOSABLE) ×3 IMPLANT
GUIDEWIRE ANG ZIPWIRE 035X150 (WIRE) ×3 IMPLANT
GUIDEWIRE STR DUAL SENSOR (WIRE) ×3 IMPLANT
MANIFOLD NEPTUNE II (INSTRUMENTS) ×3 IMPLANT
PACK CYSTO (CUSTOM PROCEDURE TRAY) ×3 IMPLANT
SHEATH ACCESS URETERAL 24CM (SHEATH) IMPLANT
SHEATH ACCESS URETERAL 38CM (SHEATH) IMPLANT
STENT URET 6FRX26 CONTOUR (STENTS) ×3 IMPLANT
TUBING CONNECTING 10 (TUBING) ×2 IMPLANT
TUBING CONNECTING 10' (TUBING) ×1
WIRE COONS/BENSON .038X145CM (WIRE) ×3 IMPLANT

## 2016-07-17 NOTE — ED Notes (Signed)
Report given to 4N.

## 2016-07-17 NOTE — Transfer of Care (Signed)
Immediate Anesthesia Transfer of Care Note  Patient: [name]  Procedure(s) Performed: Procedure(s): CYSTOSCOPY WITH RETROGRADE PYELOGRAM, URETEROSCOPY AND STENT PLACEMENT,LASER (Right)  Patient Location: PACU  Anesthesia Type:General  Level of Consciousness: awake, alert , oriented and patient cooperative  Airway & Oxygen Therapy: Patient Spontanous Breathing and Patient connected to face mask oxygen  Post-op Assessment: Report given to RN, Post -op Vital signs reviewed and stable and Patient moving all extremities X 4  Post vital signs: stable  Last Vitals:  Vitals:   07/17/16 1845 07/17/16 1953  BP: (!) 162/108   Pulse: 93   Resp: 20   Temp:  36.8 C    Last Pain:  Vitals:   07/17/16 1953  TempSrc: Oral  PainSc:          Complications: No apparent anesthesia complications

## 2016-07-17 NOTE — Discharge Instructions (Signed)
Ureteral Stent Implantation, Care After Refer to this sheet in the next few weeks. These instructions provide you with information about caring for yourself after your procedure. Your health care provider may also give you more specific instructions. Your treatment has been planned according to current medical practices, but problems sometimes occur. Call your health care provider if you have any problems or questions after your procedure.  REMOVAL OF THE STENT -- remove the stent by pulling the string with slow steady pressure on Thursday morning, Jul 22, 2016 until the entire stent is removed   What can I expect after the procedure? After the procedure, it is common to have:  Nausea.  Mild pain when you urinate. You may feel this pain in your lower back or lower abdomen. Pain should stop within a few minutes after you urinate. This may last for up to 1 week.  A small amount of blood in your urine for several days.  Follow these instructions at home:  Medicines  Take over-the-counter and prescription medicines only as told by your health care provider.  If you were prescribed an antibiotic medicine, take it as told by your health care provider. Do not stop taking the antibiotic even if you start to feel better.  Do not drive for 24 hours if you received a sedative.  Do not drive or operate heavy machinery while taking prescription pain medicines. Activity  Return to your normal activities as told by your health care provider. Ask your health care provider what activities are safe for you.  Do not lift anything that is heavier than 10 lb (4.5 kg). Follow this limit for 1 week after your procedure, or for as long as told by your health care provider. General instructions  Watch for any blood in your urine. Call your health care provider if the amount of blood in your urine increases.  If you have a catheter: ? Follow instructions from your health care provider about taking care of  your catheter and collection bag. ? Do not take baths, swim, or use a hot tub until your health care provider approves.  Drink enough fluid to keep your urine clear or pale yellow.  Keep all follow-up visits as told by your health care provider. This is important. Contact a health care provider if:  You have pain that gets worse or does not get better with medicine, especially pain when you urinate.  You have difficulty urinating.  You feel nauseous or you vomit repeatedly during a period of more than 2 days after the procedure. Get help right away if:  Your urine is dark red or has blood clots in it.  You are leaking urine (have incontinence).  The end of the stent comes out of your urethra.  You cannot urinate.  You have sudden, sharp, or severe pain in your abdomen or lower back.  You have a fever. This information is not intended to replace advice given to you by your health care provider. Make sure you discuss any questions you have with your health care provider. Document Released: 09/27/2012 Document Revised: 07/03/2015 Document Reviewed: 08/09/2014 Elsevier Interactive Patient Education  [employer].

## 2016-07-17 NOTE — ED Notes (Signed)
Attempted report 

## 2016-07-17 NOTE — ED Notes (Signed)
Report given to Gabe. 

## 2016-07-17 NOTE — ED Provider Notes (Signed)
[name]   CSN: [id number] Arrival date & time: 07/17/16  0756     History   Chief Complaint Chief Complaint  Patient presents with  . Irregular Heart Beat    HR 180     HPI [name] is a 66 y.o. male.  66 year old male presents with several day history of right-sided flank pain. Had urological surgery done for possible kidney stone which didn't show any signs of stone. There is significant pain since then. Had a brief hospitalization for suicidal ideations. He also has a history of atrial fibrillation but has been playing with his Coumadin as well as his Cardizem. Denies any dysuria or hematuria. No fever or chills. Pain is constant. Called EMS and was found to be in A. fib with RVR. Was treated with IV fluids and analgesics and transported here.      Past Medical History:  Diagnosis Date  . AAA (abdominal aortic aneurysm) ([place])    per last CT 03-26-2016---  3.9cm  . Anticoagulated on Coumadin   . Arthritis of spine ([place])   . Benign localized prostatic hyperplasia with lower urinary tract symptoms (LUTS)   . Chronic atrial fibrillation ([place]) cardiolgoist -- dr Stanford Breed   dx 2009  . Complication of anesthesia    per pt today (07-08-2016) had problem after having anesthesia 12/ 2016 surgery at [facility] "not good" went to ED ( in epic ED visit 01/ 2017 dx severe episode of major depression disorder and admitted to [facility]  . COPD with emphysema ([place])   . Dyspnea on exertion   . Full dentures   . GERD (gastroesophageal reflux disease)   . Hematuria   . History of amaurosis fugax    right eye 02-08-2015 -- resolved /  documented in epic possible left eye amaurosis fugax 11/ 2008 (pt denies) / per last MRI show previous bilateral occipital lobe infarcts  . History of concussion    as child  . History of CVA (cerebrovascular accident) 10/ 2016  and per pt Dec 2017 in [place]   neurologist-  dr Leonie Man-- per note silent infarcts on MRI-- multiple  acute infarcts bilateral cerebullm, right temporal lobe, and bilateral occipital lobes due to embolic phenomena (atrial fib.)  . History of seizures as a child   . Hyperlipidemia   . Hypertension   . Left arm numbness    occasional numbess post residual axilla arterial occlusion and surgery  . Major depressive disorder    hx severe episode without psychosis 02/2015 w/ homicidal ideation  . Narcotic dependence ([place])   . PAD (peripheral artery disease) ([place])    followed by dr fields-- s/p  left axilla to branchial and left axilla to ulnar bypass graft due to ischemic hand  . Pulmonary nodule    per CT 03-26-2016  right lower lobe  . PVD (peripheral vascular disease) ([place])   . Right ureteral stone   . Senile purpura ([place])   . Weakness of left arm    residual from axilla arterial occlusion and post surgery  . Wears glasses     Patient Active Problem List   Diagnosis Date Noted  . Adjustment disorder with disturbance of emotion 07/14/2016  . Hyperlipemia 07/07/2016  . Arterial occlusion due to stenosis ([place]) 08/21/2015  . Aftercare following surgery of the circulatory system 08/21/2015  . Current smoker 08/21/2015  . Abdominal aortic aneurysm (AAA) ([place]) 05/02/2015  . MDD (major depressive disorder), single episode, severe , no psychosis ([place]) 02/10/2015  .  Major depressive disorder, recurrent, severe without psychotic features ([place]) 02/09/2015  . Limb ischemia 01/23/2015  . Numbness 01/20/2015  . PAD (peripheral artery disease) ([place]) 12/10/2014  . Embolic stroke ([place]) 84/16/6063  . Ischemia of extremity 11/20/2014  . Ischemia of hand 04/05/2014  . Special screening for malignant neoplasms, colon 03/20/2014  . Warfarin-induced coagulopathy ([place]) 03/20/2014  . Encounter for therapeutic drug monitoring 03/08/2013  . Headache 01/29/2013  . Prostate nodule with urinary obstruction 12/13/2012  . Routine general medical examination at a health care facility 12/13/2012  . Depression  with anxiety 12/07/2012  . Long term (current) use of anticoagulants 04/30/2010  . HYPERCHOLESTEROLEMIA-PURE 04/22/2008  . TOBACCO ABUSE 04/22/2008  . HYPERTENSION, BENIGN ESSENTIAL 04/22/2008  . Atrial fibrillation ([place]) 04/22/2008  . COPD (chronic obstructive pulmonary disease) ([place]) 04/22/2008    Past Surgical History:  Procedure Laterality Date  . ARCH AORTOGRAM N/A 04/05/2014   Procedure: ARCH AORTOGRAM, FIRST ORDER CATHETERIZATION LEFT SUBCLAVIAN ARTERY;  Surgeon: Elam Dutch, MD;  Location: [facility] OR;  Service: Vascular;  Laterality: N/A;  . AXILLARY-FEMORAL BYPASS GRAFT Left 04/08/2014   Procedure: LEFT AXILLARY ARTERY TO BRACHIAL ARTERY BYPASS USING NON REVERSE LEFT GREATER SAPHENOUS VEIN ,LIGATION OF LEFT AXILLARY ARTERY ANEURYSM;  Surgeon: Elam Dutch, MD;  Location: [facility];  Service: Vascular;  Laterality: Left;  . BYPASS AXILLA/BRACHIAL ARTERY Left 01/27/2015   Procedure: LEFT BRACHIAL-ULAR ARTERY BYPASS USING GREATER SAPHENOUS VEIN;  Surgeon: Elam Dutch, MD;  Location: [facility];  Service: Vascular;  Laterality: Left;  . CYSTOSCOPY/URETEROSCOPY/HOLMIUM LASER/STENT PLACEMENT Right 07/13/2016   Procedure: CYSTOSCOPY, RIGHT URETEROSCOPY RETROGRADE PYELOGRAM;  Surgeon: Kathie Rhodes, MD;  Location: [facility];  Service: Urology;  Laterality: Right;  . EMBOLECTOMY Left 04/05/2014   Procedure: THROMBO ENDARTERECTOMY OF LEFT BRACHIAL, RADIAL AND ULNAR ARTERY,  Left Radial artery cut down and radial artery thrombectomy.;  Surgeon: Elam Dutch, MD;  Location: [facility] OR;  Service: Vascular;  Laterality: Left;  . facial cyst Right    cyst removal.   . PATCH ANGIOPLASTY Left 04/05/2014   Procedure: LEFT ARM VEIN PATCH ANGIOPLASTY;  Surgeon: Elam Dutch, MD;  Location: [facility];  Service: Vascular;  Laterality: Left;  . PERIPHERAL VASCULAR CATHETERIZATION N/A 01/22/2015   Procedure: Aortic Arch Angiography;  Surgeon: Elam Dutch, MD;  Location: [facility] CV LAB;   Service: Cardiovascular;  Laterality: N/A;  . THROMBECTOMY BRACHIAL ARTERY Left 11/20/2014   Procedure: 1.  Thrombectomy Left Axilo-Brachial Bypass  2.  Thromboendarterecotmy of Left Brachial Artery with Fogarty Thrombectomy of Radial and Ulnar Arteries with Dacron patch angioplasty Left Brachial Artery. 3. Intraoperative  Arteriogram times four.;  Surgeon: Mal Misty, MD;  Location: [facility];  Service: Vascular;  Laterality: Left;  . TRANSTHORACIC ECHOCARDIOGRAM  11/25/2014   moderate concentric LVH, ef 60-65%, unable to evaluation LVDF due to atrial fib/  mild MR/  severe LAE  . VEIN HARVEST Left 04/08/2014   Procedure: LEFT GREATER SAPHENOUS VEIN HARVEST;  Surgeon: Elam Dutch, MD;  Location: [facility];  Service: Vascular;  Laterality: Left;  Marland Kitchen VEIN HARVEST Right 01/27/2015   Procedure: RIGHT GREATER SAPHENOUS VEIN HARVEST;  Surgeon: Elam Dutch, MD;  Location: [facility];  Service: Vascular;  Laterality: Right;       Home Medications    Prior to Admission medications   Medication Sig Start Date End Date Taking? Authorizing Provider  acetaminophen (TYLENOL) 500 MG tablet Take 500 mg by mouth every 6 (six) hours as needed  for mild pain.     [provider]  albuterol (PROVENTIL HFA;VENTOLIN HFA) 108 (90 Base) MCG/ACT inhaler Inhale 2 puffs into the lungs every 6 (six) hours as needed for wheezing or shortness of breath. 02/12/15   Lindell Spar I, NP  ALPRAZolam Duanne Moron) 0.5 MG tablet Take 0.5 mg by mouth at bedtime as needed for anxiety or sleep.  05/29/15   [provider]  atorvastatin (LIPITOR) 40 MG tablet Take 1 tablet (40 mg total) by mouth daily. For high cholesterol 02/12/15   Nwoko, Herbert Pun I, NP  calcium carbonate (TUMS - DOSED IN MG ELEMENTAL CALCIUM) 500 MG chewable tablet Chew 1 tablet by mouth 2 (two) times daily with a meal.     [provider]  diltiazem (TIAZAC) 240 MG 24 hr capsule TAKE ONE (1) CAPSULE BY MOUTH EACH DAY Patient taking differently: TAKE  ONE (1) CAPSULE BY MOUTH EACH DAY--- takes in am 11/14/15   Lelon Perla, MD  furosemide (LASIX) 20 MG tablet Take 20 mg by mouth daily.     [provider]  gabapentin (NEURONTIN) 100 MG capsule Take 1 capsule (100 mg total) by mouth at bedtime. For agitation 02/12/15   Lindell Spar I, NP  metoprolol (LOPRESSOR) 100 MG tablet TAKE ONE (1) TABLET BY MOUTH TWO (2) TIMES DAILY 06/11/16   Lelon Perla, MD  traMADol (ULTRAM) 50 MG tablet Take 50-100 mg by mouth every 6 (six) hours as needed for moderate pain.  12/16/15   [provider]  traMADol (ULTRAM) 50 MG tablet Take 1 tablet (50 mg total) by mouth every 6 (six) hours. 07/14/16   Patrecia Pour, NP  warfarin (COUMADIN) 5 MG tablet Take 1 tablet (5 mg total) by mouth daily at 6 PM. Take as directed.  Dose may vary pending INR: For blood clot prevention 02/12/15   Encarnacion Slates, NP    Family History Family History  Problem Relation Age of Onset  . Hypertension Mother   . Lung cancer Mother   . Stomach cancer Father        died age 61  . Cancer Sister   . Early death Neg Hx   . Heart disease Neg Hx   . Hyperlipidemia Neg Hx   . Kidney disease Neg Hx   . Stroke Neg Hx     Social History Social History  Substance Use Topics  . Smoking status: Current Every Day Smoker    Packs/day: 2.00    Years: 51.00    Types: Cigarettes  . Smokeless tobacco: Never Used     Comment: per pt 07-08-2016 down to 2ppd from 4 ppd (quit one time for 4 years since started at age 62)  . Alcohol use 0.0 oz/week     Comment: occasional      Allergies   Oxycodone   Review of Systems Review of Systems  All other systems reviewed and are negative.    Physical Exam Updated Vital Signs BP (!) 136/108   Pulse 64   Temp 98.7 F (37.1 C) (Oral)   Resp 18   Ht 1.727 m (5\' 8" )   Wt 76.2 kg (168 lb)   SpO2 97%   BMI 25.54 kg/m   Physical Exam  Constitutional: He is oriented to person, place, and time. He appears  well-developed and well-nourished.  Non-toxic appearance. No distress.  HENT:  Head: Normocephalic and atraumatic.  Eyes: Conjunctivae, EOM and lids are normal. Pupils are equal, round, and reactive to  light.  Neck: Normal range of motion. Neck supple. No tracheal deviation present. No thyroid mass present.  Cardiovascular: Normal heart sounds.  An irregularly irregular rhythm present. Tachycardia present.  Exam reveals no gallop.   No murmur heard. Pulmonary/Chest: Effort normal and breath sounds normal. No stridor. No respiratory distress. He has no decreased breath sounds. He has no wheezes. He has no rhonchi. He has no rales.  Abdominal: Soft. Normal appearance and bowel sounds are normal. He exhibits no distension. There is no tenderness. There is CVA tenderness. There is no rebound.  Musculoskeletal: Normal range of motion. He exhibits no edema or tenderness.  Neurological: He is alert and oriented to person, place, and time. He has normal strength. No cranial nerve deficit or sensory deficit. GCS eye subscore is 4. GCS verbal subscore is 5. GCS motor subscore is 6.  Skin: Skin is warm and dry. No abrasion and no rash noted.  Psychiatric: He has a normal mood and affect. His speech is normal and behavior is normal.  Nursing note and vitals reviewed.    ED Treatments / Results  Labs (all labs ordered are listed, but only abnormal results are displayed) Labs Reviewed  CBC WITH DIFFERENTIAL/PLATELET  COMPREHENSIVE METABOLIC PANEL  URINALYSIS, ROUTINE W REFLEX MICROSCOPIC  TROPONIN I  PROTIME-INR    EKG  EKG Interpretation None      ED ECG REPORT   Date: 07/17/2016  Rate: 160  Rhythm: atrial fibrillation  QRS Axis: normal  Intervals: normal  ST/T Wave abnormalities: nonspecific ST changes  Conduction Disutrbances:none  Narrative Interpretation:   Old EKG Reviewed: none available  I have personally reviewed the EKG tracing and agree with the computerized printout as  noted.  Radiology No results found.  Procedures Procedures (including critical care time)  Medications Ordered in ED Medications  0.9 %  sodium chloride infusion (125 mL/hr Intravenous New Bag/Given 07/17/16 0818)  diltiazem (CARDIZEM) 1 mg/mL load via infusion 20 mg (not administered)    And  diltiazem (CARDIZEM) 100 mg in dextrose 5 % 100 mL (1 mg/mL) infusion (not administered)  ondansetron (ZOFRAN) injection 4 mg (4 mg Intravenous Given 07/17/16 0820)  morphine 4 MG/ML injection 4 mg (4 mg Intravenous Given 07/17/16 0820)     Initial Impression / Assessment and Plan / ED Course  I have reviewed the triage vital signs and the nursing notes.  Pertinent labs & imaging results that were available during my care of the patient were reviewed by me and considered in my medical decision making (see chart for details).     Patient given a bolus of Cardizem 20 mg and started on a Cardizem drip. He was medicated for pain with morphine. His Cardizem drip was adjusted and his heart rate improved. Has evidence of an infected renal stone is Rocephin. I consulted neurology wants the patient transferred to [facility] for admission to the medicine service. They will take the patient to the OR today for percutaneous drainage    CRITICAL CARE Performed by: Leota Jacobsen Total critical care time: 60 minutes Critical care time was exclusive of separately billable procedures and treating other patients. Critical care was necessary to treat or prevent imminent or life-threatening deterioration. Critical care was time spent personally by me on the following activities: development of treatment plan with patient and/or surrogate as well as nursing, discussions with consultants, evaluation of patient's response to treatment, examination of patient, obtaining history from patient or surrogate, ordering and performing treatments and interventions, ordering  and review of laboratory studies, ordering  and review of radiographic studies, pulse oximetry and re-evaluation of patient's condition.  Final Clinical Impressions(s) / ED Diagnoses   Final diagnoses:  None    New Prescriptions New Prescriptions   No medications on file     Lacretia Leigh, MD 07/17/16 1105

## 2016-07-17 NOTE — Progress Notes (Signed)
Continued RLQ pain - requests to proceed with stent/URS if possible. AFVSS. HR slower.

## 2016-07-17 NOTE — Consult Note (Addendum)
Cardiology Consultation:   Patient ID: ASHAUN [name]; [id number]; Nov 27, 1950   Admit date: 07/17/2016 Date of Consult: 07/17/2016  Primary Care Provider: Shon Baton, MD Primary Cardiologist: Stanford Breed Primary Electrophysiologist:  none   Patient Profile:   [name] is a 66 y.o. male with a hx of renal stones and atrial fib who is being seen today for the evaluation of atrial fib at the request of Dr. Aggie Moats.  History of Present Illness:   [name] is a 66 yo man with known atrial fib on coumadin, who also has a h/o renal stones and presents today with severe right sided flank and abdominal pain and found to have on obstructing renal stone. He is noted to have atrial fib with a RVR. He denies fever and chills. He has preserved LV function and a remote stroke due to atrial fib. He is otherwise very active and has no CHF symptoms and no angina. His flank pain is severe.  Past Medical History:  Diagnosis Date  . AAA (abdominal aortic aneurysm) ([place])    per last CT 03-26-2016---  3.9cm  . Anticoagulated on Coumadin   . Arthritis of spine ([place])   . Benign localized prostatic hyperplasia with lower urinary tract symptoms (LUTS)   . Chronic atrial fibrillation ([place]) cardiolgoist -- dr Stanford Breed   dx 2009  . Complication of anesthesia    per pt today (07-08-2016) had problem after having anesthesia 12/ 2016 surgery [facility] "not good" went to ED ( in epic ED visit 01/ 2017 dx severe episode of major depression disorder and [facility]  . COPD with emphysema ([place])   . Dyspnea on exertion   . Full dentures   . GERD (gastroesophageal reflux disease)   . Hematuria   . History of amaurosis fugax    right eye 02-08-2015 -- resolved /  documented in epic possible left eye amaurosis fugax 11/ 2008 (pt denies) / per last MRI show previous bilateral occipital lobe infarcts  . History of concussion    as child  . History of CVA (cerebrovascular accident) 10/ 2016  and per pt  Dec 2017 in [place]   neurologist-  dr Leonie Man-- per note silent infarcts on MRI-- multiple acute infarcts bilateral cerebullm, right temporal lobe, and bilateral occipital lobes due to embolic phenomena (atrial fib.)  . History of seizures as a child   . Hyperlipidemia   . Hypertension   . Left arm numbness    occasional numbess post residual axilla arterial occlusion and surgery  . Major depressive disorder    hx severe episode without psychosis 02/2015 w/ homicidal ideation  . Narcotic dependence ([place])   . PAD (peripheral artery disease) ([place])    followed by dr fields-- s/p  left axilla to branchial and left axilla to ulnar bypass graft due to ischemic hand  . Pulmonary nodule    per CT 03-26-2016  right lower lobe  . PVD (peripheral vascular disease) ([place])   . Right ureteral stone   . Senile purpura ([place])   . Weakness of left arm    residual from axilla arterial occlusion and post surgery  . Wears glasses     Past Surgical History:  Procedure Laterality Date  . ARCH AORTOGRAM N/A 04/05/2014   Procedure: ARCH AORTOGRAM, FIRST ORDER CATHETERIZATION LEFT SUBCLAVIAN ARTERY;  Surgeon: Elam Dutch, MD;  Location: [facility] OR;  Service: Vascular;  Laterality: N/A;  . AXILLARY-FEMORAL BYPASS GRAFT Left 04/08/2014   Procedure: LEFT AXILLARY  ARTERY TO BRACHIAL ARTERY BYPASS USING NON REVERSE LEFT GREATER SAPHENOUS VEIN ,LIGATION OF LEFT AXILLARY ARTERY ANEURYSM;  Surgeon: Elam Dutch, MD;  Location: [place];  Service: Vascular;  Laterality: Left;  . BYPASS AXILLA/BRACHIAL ARTERY Left 01/27/2015   Procedure: LEFT BRACHIAL-ULAR ARTERY BYPASS USING GREATER SAPHENOUS VEIN;  Surgeon: Elam Dutch, MD;  Location: [place];  Service: Vascular;  Laterality: Left;  . CYSTOSCOPY/URETEROSCOPY/HOLMIUM LASER/STENT PLACEMENT Right 07/13/2016   Procedure: CYSTOSCOPY, RIGHT URETEROSCOPY RETROGRADE PYELOGRAM;  Surgeon: Kathie Rhodes, MD;  [facility];  Service: Urology;  Laterality:  Right;  . EMBOLECTOMY Left 04/05/2014   Procedure: THROMBO ENDARTERECTOMY OF LEFT BRACHIAL, RADIAL AND ULNAR ARTERY,  Left Radial artery cut down and radial artery thrombectomy.;  Surgeon: Elam Dutch, MD;  Location: [facility] OR;  Service: Vascular;  Laterality: Left;  . facial cyst Right    cyst removal.   . PATCH ANGIOPLASTY Left 04/05/2014   Procedure: LEFT ARM VEIN PATCH ANGIOPLASTY;  Surgeon: Elam Dutch, MD;  Location: [place];  Service: Vascular;  Laterality: Left;  . PERIPHERAL VASCULAR CATHETERIZATION N/A 01/22/2015   Procedure: Aortic Arch Angiography;  Surgeon: Elam Dutch, MD;  Location: [place] [facility];  Service: Cardiovascular;  Laterality: N/A;  . THROMBECTOMY BRACHIAL ARTERY Left 11/20/2014   Procedure: 1.  Thrombectomy Left Axilo-Brachial Bypass  2.  Thromboendarterecotmy of Left Brachial Artery with Fogarty Thrombectomy of Radial and Ulnar Arteries with Dacron patch angioplasty Left Brachial Artery. 3. Intraoperative  Arteriogram times four.;  Surgeon: Mal Misty, MD;  Location: [place];  Service: Vascular;  Laterality: Left;  . TRANSTHORACIC ECHOCARDIOGRAM  11/25/2014   moderate concentric LVH, ef 60-65%, unable to evaluation LVDF due to atrial fib/  mild MR/  severe LAE  . VEIN HARVEST Left 04/08/2014   Procedure: LEFT GREATER SAPHENOUS VEIN HARVEST;  Surgeon: Elam Dutch, MD;  Location: [place];  Service: Vascular;  Laterality: Left;  Marland Kitchen VEIN HARVEST Right 01/27/2015   Procedure: RIGHT GREATER SAPHENOUS VEIN HARVEST;  Surgeon: Elam Dutch, MD;  Location: [place];  Service: Vascular;  Laterality: Right;     Inpatient Medications: Scheduled Meds:  Continuous Infusions: . sodium chloride 125 mL/hr (07/17/16 0818)  . cefTRIAXone (ROCEPHIN)  IV Stopped (07/17/16 1153)  . diltiazem (CARDIZEM) infusion 15 mg/hr (07/17/16 1330)   PRN Meds: acetaminophen, morphine injection, ondansetron (ZOFRAN) IV  Allergies:    Allergies  Allergen Reactions  . Oxycodone  Itching    Social History:   Social History   Social History  . Marital status: Divorced    Spouse name: N/A  . Number of children: 1  . Years of education: N/A   Occupational History  . 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 Smoking status: Current Every Day Smoker    Packs/day: 2.00    Years: 51.00    Types: Cigarettes  . Smokeless tobacco: Never Used     Comment: per pt 07-08-2016 down to 2ppd from 4 ppd (quit one time for 4 years since started at age 13)  . Alcohol use 0.0 oz/week     Comment: occasional   . Drug use: No     Comment: per pt 07-08-2016 hx drug use stopped 1970's  . Sexual activity: Not on file   Other Topics Concern  . Not on file   Social History Narrative  . No narrative on file    Family History:   The patient's family history includes  Cancer in his sister; Hypertension in his mother; Lung cancer in his mother; Stomach cancer in his father. There is no history of Early death, Heart disease, Hyperlipidemia, Kidney disease, or Stroke.  ROS:  Please see the history of present illness.  ROS  All other ROS reviewed and negative.     Physical Exam/Data:   Vitals:   07/17/16 1430 07/17/16 1500 07/17/16 1506 07/17/16 1515  BP: (!) 120/95 119/90  (!) 140/96  Pulse: (!) 106 (!) 59 90 89  Resp: 19 19 15 17   Temp:      TempSrc:      SpO2: 94% 96% 96% 95%  Weight:      Height:       No intake or output data in the 24 hours ending 07/17/16 1538 Filed Weights   07/17/16 0804  Weight: 168 lb (76.2 kg)   Body mass index is 25.54 kg/m.  General:  Acutely ill appearing in mild distress HEENT: normal except a bit diskempt Lymph: no adenopathy Neck: 6 cm JVD Endocrine:  No thryomegaly Vascular: No carotid bruits; FA pulses 2+ bilaterally without bruits  Cardiac:  normal S1, S2; IRIR; no murmur  Lungs:  clear to auscultation bilaterally, no wheezing, rhonchi or rales  Abd: soft,  With right lower quadrant tenderness, no  hepatomegaly  Ext: no edema Musculoskeletal:  No deformities, BUE and BLE strength normal and equal Skin: warm and dry  Neuro:  CNs 2-12 intact, no focal abnormalities noted Psych:  Normal affect    EKG:  The EKG was personally reviewed and demonstrates atrial fib with a RVR  Relevant CV Studies: Normal LV function by echo previously  Laboratory Data:  Chemistry Recent Labs Lab 07/13/16 1052 07/17/16 0805  NA 139 134*  K 3.3* 4.3  CL 102 99*  CO2  --  22  GLUCOSE 108* 134*  BUN 7 25*  CREATININE 0.80 1.76*  CALCIUM  --  9.3  GFRNONAA  --  39*  GFRAA  --  45*  ANIONGAP  --  13     Recent Labs Lab 07/17/16 0805  PROT 7.1  ALBUMIN 3.6  AST 17  ALT 15*  ALKPHOS 69  BILITOT 2.9*   Hematology Recent Labs Lab 07/13/16 1052 07/13/16 1428 07/17/16 0805  WBC  --  7.0 23.2*  RBC  --  4.34 5.71  HGB 17.3* 14.2 18.8*  HCT 51.0 40.6 52.5*  MCV  --  93.5 91.9  MCH  --  32.7 32.9  MCHC  --  35.0 35.8  RDW  --  13.4 13.3  PLT  --  190 196   Cardiac Enzymes Recent Labs Lab 07/17/16 0805 07/17/16 1047  TROPONINI 0.07* 0.10*   No results for input(s): TROPIPOC in the last 168 hours.  BNPNo results for input(s): BNP, PROBNP in the last 168 hours.  DDimer No results for input(s): DDIMER in the last 168 hours.  Radiology/Studies:  Dg Chest Port 1 View  Result Date: 07/17/2016 CLINICAL DATA:  Shortness of breath and chest pain today. EXAM: PORTABLE CHEST 1 VIEW COMPARISON:  CT chest 03/26/2016.  PA and lateral chest 06/29/2016. FINDINGS: The lungs are emphysematous but clear. Heart size is normal. Aortic atherosclerosis is noted. No pneumothorax or pleural effusion. No acute bony abnormality. IMPRESSION: No acute disease. Emphysema. Atherosclerosis. Electronically Signed   By: Inge Rise M.D.   On: 07/17/2016 08:33   Ct Renal Stone Study  Result Date: 07/17/2016 CLINICAL DATA:  Right lower quadrant pain today.  Patient status post retrograde urethrogram for  renal stones 07/13/2016. EXAM: CT ABDOMEN AND PELVIS WITHOUT CONTRAST TECHNIQUE: Multidetector CT imaging of the abdomen and pelvis was performed following the standard protocol without IV contrast. COMPARISON:  Image from the urethra g 07/13/2016. CT abdomen and pelvis 06/07/2016. FINDINGS: Lower chest: Heart size is upper normal. Mild dependent atelectasis noted. No pleural or pericardial effusion. Hepatobiliary: No focal liver abnormality is seen. No gallstones, gallbladder wall thickening, or biliary dilatation. Pancreas: A few calcifications are seen in the pancreas consistent chronic pancreatitis. 0.7 cm cystic lesion in the head of the pancreas is unchanged and may be due to prior inflammation. Minimal prominence of the pancreatic duct is noted, stable. Spleen: Normal in size without focal abnormality. Adrenals/Urinary Tract: The adrenal glands appear normal. There is new extensive stranding about the right kidney and ureter and moderate to moderately severe right hydronephrosis and dilatation of the right ureter due to a 0.4 cm in the distal right ureter. The stone is 4-5 cm proximal to the UVJ. The stone was in the proximal right ureter on the most recent CT. No other urinary tract stones are identified. The left kidney and urinary bladder appear normal. Stomach/Bowel: Stomach is within normal limits. Appendix appears normal. No evidence of bowel wall thickening, distention, or inflammatory changes. Vascular/Lymphatic: Aortic atherosclerosis. No enlarged abdominal or pelvic lymph nodes. Reproductive: Prostate is normal in size with some calcifications present. Other: Small volume of free pelvic fluid is identified. Musculoskeletal: No acute or focal abnormality. IMPRESSION: Moderate to moderately severe right hydronephrosis and extensive stranding about the right kidney and ureter are new since the most recent CT scan. Obstructing 0.4 cm stone is seen in the distal right ureter approximately 4-5 cm proximal  to the UVJ. Atherosclerosis. Chronic calcific pancreatitis. Low attenuating lesion the pancreas is described on report of prior exam is unchanged. Electronically Signed   By: Inge Rise M.D.   On: 07/17/2016 09:46    Assessment and Plan:   1. Atrial fib with a RVR/CVR - I would use IV cardizem until he is able to take oral intake, then switch back to po.  2. Coags - his INR is subtherapeutic. He will need to restart warfarin with no IV heparin over lap after his surgery 3. HTN - his blood pressure is fairly well controlled. Will follow. He should do better once the pain is controlled. 4. Preoperative risk eval - the patient is low risk for major cardiac complications with pending surgery.    Signed, Cristopher Peru, MD  07/17/2016 3:38 PM

## 2016-07-17 NOTE — ED Triage Notes (Addendum)
Pt here with HR 130-180 irregular, pt called out for 8/10 RLQ  Pain with recent kidney stone sx x 3 days ago at [facility], pt rcvd 150 mcg Fentanyl pta, pt denies SOB, pt A&Ox4 , pt states, "I quit taking all of my medicines two days ago. I could not take it, I was throwing it up."

## 2016-07-17 NOTE — H&P (Signed)
History and Physical    BRAXXTON [name] YSA:[id number] DOB: 1951/02/04 DOA: 07/17/2016  PCP: Shon Baton, MD Patient coming from: home  Chief Complaint: right flank pain/nausea  HPI: [name] is a pleasant 66 y.o. male with medical history significant for A. fib on Coumadin, hyperlipidemia, PAD, embolic stroke, COPD, renal calculus is emergency department with chief complaint right flank pain persistent nausea palpitations. Initial evaluation reveals A. fib with RVR, obstructive ureteral calculus, hydronephrosis and elevated troponin.  Information is obtained from the patient. He reports he recently had surgery for "kidney stone" but they "didn't get it". He has continued to have persistent right flank pain. Associated symptoms include nausea decreased oral intake. He's been unable to take any of his home medications including his Coumadin and his rate control medication. He does have intermittent palpitations but denies shortness of breath chest pain lower extremity edema headache dizziness syncope or near-syncope. He denies fever chills dysuria hematuria frequency or urgency.    ED Course: In the emergency department he is provided with a Cardizem drip morphine Rocephin IV fluids.  Review of Systems: As per HPI otherwise all other systems reviewed and are negative.   Ambulatory Status: He ambulates independently is independent with ADLs  Past Medical History:  Diagnosis Date  . AAA (abdominal aortic aneurysm) ([place])    per last CT 03-26-2016---  3.9cm  . Anticoagulated on Coumadin   . Arthritis of spine ([place])   . Benign localized prostatic hyperplasia with lower urinary tract symptoms (LUTS)   . Chronic atrial fibrillation ([place]) cardiolgoist -- dr Stanford Breed   dx 2009  . Complication of anesthesia    per pt today (07-08-2016) had problem after having anesthesia 12/ 2016 surgery [facility] "not good" went to ED ( in epic ED visit 01/ 2017 dx severe episode of major depression  disorder and admitted to [facility]  . COPD with emphysema ([place])   . Dyspnea on exertion   . Full dentures   . GERD (gastroesophageal reflux disease)   . Hematuria   . History of amaurosis fugax    right eye 02-08-2015 -- resolved /  documented in epic possible left eye amaurosis fugax 11/ 2008 (pt denies) / per last MRI show previous bilateral occipital lobe infarcts  . History of concussion    as child  . History of CVA (cerebrovascular accident) 10/ 2016  and per pt Dec 2017 in [place]   neurologist-  dr Leonie Man-- per note silent infarcts on MRI-- multiple acute infarcts bilateral cerebullm, right temporal lobe, and bilateral occipital lobes due to embolic phenomena (atrial fib.)  . History of seizures as a child   . Hyperlipidemia   . Hypertension   . Left arm numbness    occasional numbess post residual axilla arterial occlusion and surgery  . Major depressive disorder    hx severe episode without psychosis 02/2015 w/ homicidal ideation  . Narcotic dependence ([place])   . PAD (peripheral artery disease) ([place])    followed by dr fields-- s/p  left axilla to branchial and left axilla to ulnar bypass graft due to ischemic hand  . Pulmonary nodule    per CT 03-26-2016  right lower lobe  . PVD (peripheral vascular disease) ([place])   . Right ureteral stone   . Senile purpura ([place])   . Weakness of left arm    residual from axilla arterial occlusion and post surgery  . Wears glasses     Past Surgical History:  Procedure Laterality Date  .  ARCH AORTOGRAM N/A 04/05/2014   Procedure: ARCH AORTOGRAM, FIRST ORDER CATHETERIZATION LEFT SUBCLAVIAN ARTERY;  Surgeon: Elam Dutch, MD;  Location: [facility] OR;  Service: Vascular;  Laterality: N/A;  . AXILLARY-FEMORAL BYPASS GRAFT Left 04/08/2014   Procedure: LEFT AXILLARY ARTERY TO BRACHIAL ARTERY BYPASS USING NON REVERSE LEFT GREATER SAPHENOUS VEIN ,LIGATION OF LEFT AXILLARY ARTERY ANEURYSM;  Surgeon: Elam Dutch, MD;  Location: [place];  Service:  Vascular;  Laterality: Left;  . BYPASS AXILLA/BRACHIAL ARTERY Left 01/27/2015   Procedure: LEFT BRACHIAL-ULAR ARTERY BYPASS USING GREATER SAPHENOUS VEIN;  Surgeon: Elam Dutch, MD;  Location: [place];  Service: Vascular;  Laterality: Left;  . CYSTOSCOPY/URETEROSCOPY/HOLMIUM LASER/STENT PLACEMENT Right 07/13/2016   Procedure: CYSTOSCOPY, RIGHT URETEROSCOPY RETROGRADE PYELOGRAM;  Surgeon: Kathie Rhodes, MD;  Location: [facility];  Service: Urology;  Laterality: Right;  . EMBOLECTOMY Left 04/05/2014   Procedure: THROMBO ENDARTERECTOMY OF LEFT BRACHIAL, RADIAL AND ULNAR ARTERY,  Left Radial artery cut down and radial artery thrombectomy.;  Surgeon: Elam Dutch, MD;  Location: [facility] OR;  Service: Vascular;  Laterality: Left;  . facial cyst Right    cyst removal.   . PATCH ANGIOPLASTY Left 04/05/2014   Procedure: LEFT ARM VEIN PATCH ANGIOPLASTY;  Surgeon: Elam Dutch, MD;  Location: [place];  Service: Vascular;  Laterality: Left;  . PERIPHERAL VASCULAR CATHETERIZATION N/A 01/22/2015   Procedure: Aortic Arch Angiography;  Surgeon: Elam Dutch, MD;  Location: [facility] LAB;  Service: Cardiovascular;  Laterality: N/A;  . THROMBECTOMY BRACHIAL ARTERY Left 11/20/2014   Procedure: 1.  Thrombectomy Left Axilo-Brachial Bypass  2.  Thromboendarterecotmy of Left Brachial Artery with Fogarty Thrombectomy of Radial and Ulnar Arteries with Dacron patch angioplasty Left Brachial Artery. 3. Intraoperative  Arteriogram times four.;  Surgeon: Mal Misty, MD;  Location: [place];  Service: Vascular;  Laterality: Left;  . TRANSTHORACIC ECHOCARDIOGRAM  11/25/2014   moderate concentric LVH, ef 60-65%, unable to evaluation LVDF due to atrial fib/  mild MR/  severe LAE  . VEIN HARVEST Left 04/08/2014   Procedure: LEFT GREATER SAPHENOUS VEIN HARVEST;  Surgeon: Elam Dutch, MD;  Location: [place];  Service: Vascular;  Laterality: Left;  Marland Kitchen VEIN HARVEST Right 01/27/2015   Procedure: RIGHT GREATER  SAPHENOUS VEIN HARVEST;  Surgeon: Elam Dutch, MD;  Location: [place];  Service: Vascular;  Laterality: Right;    Social History   Social History  . Marital status: Divorced    Spouse name: N/A  . Number of children: 1  . Years of education: N/A   Occupational History  . 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 Smoking status: Current Every Day Smoker    Packs/day: 2.00    Years: 51.00    Types: Cigarettes  . Smokeless tobacco: Never Used     Comment: per pt 07-08-2016 down to 2ppd from 4 ppd (quit one time for 4 years since started at age 70)  . Alcohol use 0.0 oz/week     Comment: occasional   . Drug use: No     Comment: per pt 07-08-2016 hx drug use stopped 1970's  . Sexual activity: Not on file   Other Topics Concern  . Not on file   Social History Narrative  . No narrative on file    Allergies  Allergen Reactions  . Oxycodone Itching    Family History  Problem Relation Age of Onset  . Hypertension Mother   . Lung cancer Mother   .  Stomach cancer Father        died age 15  . Cancer Sister   . Early death Neg Hx   . Heart disease Neg Hx   . Hyperlipidemia Neg Hx   . Kidney disease Neg Hx   . Stroke Neg Hx     Prior to Admission medications   Medication Sig Start Date End Date Taking? Authorizing Provider  acetaminophen (TYLENOL) 500 MG tablet Take 500 mg by mouth every 6 (six) hours as needed for mild pain.    Yes [provider]  ALPRAZolam Duanne Moron) 0.5 MG tablet Take 0.5 mg by mouth at bedtime as needed for anxiety or sleep.  05/29/15  Yes [provider]  diltiazem (TIAZAC) 240 MG 24 hr capsule TAKE ONE (1) CAPSULE BY MOUTH EACH DAY Patient taking differently: TAKE ONE (1) CAPSULE BY MOUTH EACH DAY--- takes in am 11/14/15  Yes Crenshaw, Denice Bors, MD  metoprolol (LOPRESSOR) 100 MG tablet TAKE ONE (1) TABLET BY MOUTH TWO (2) TIMES DAILY 06/11/16  Yes Lelon Perla, MD  warfarin (COUMADIN) 5 MG tablet Take 1  tablet (5 mg total) by mouth daily at 6 PM. Take as directed.  Dose may vary pending INR: For blood clot prevention 02/12/15  Yes Nwoko, Herbert Pun I, NP  atorvastatin (LIPITOR) 40 MG tablet Take 1 tablet (40 mg total) by mouth daily. For high cholesterol 02/12/15   Nwoko, Herbert Pun I, NP  calcium carbonate (TUMS - DOSED IN MG ELEMENTAL CALCIUM) 500 MG chewable tablet Chew 1 tablet by mouth 2 (two) times daily with a meal.     [provider]  furosemide (LASIX) 20 MG tablet Take 20 mg by mouth daily.     [provider]  gabapentin (NEURONTIN) 100 MG capsule Take 1 capsule (100 mg total) by mouth at bedtime. For agitation 02/12/15   Lindell Spar I, NP  traMADol (ULTRAM) 50 MG tablet Take 50-100 mg by mouth every 6 (six) hours as needed for moderate pain.  12/16/15   [provider]  traMADol (ULTRAM) 50 MG tablet Take 1 tablet (50 mg total) by mouth every 6 (six) hours. 07/14/16   Patrecia Pour, NP    Physical Exam: Vitals:   07/17/16 1000 07/17/16 1030 07/17/16 1045 07/17/16 1100  BP: (!) 143/100 (!) 152/110 (!) 130/109 (!) 138/109  Pulse: (!) 44 77 81 (!) 27  Resp: 20 19 17 15   Temp:      TempSrc:      SpO2: 96% 94% 96% 90%  Weight:      Height:         General:  Appears calm and comfortable, sitting up in bed Eyes:  PERRL, EOMI, normal lids, iris ENT:  grossly normal hearing, lips & tongue, mucous membranes of his mouth are pink slightly dry Neck:  no LAD, masses or thyromegaly Cardiovascular:  Irregularly irregular no m/r/g. No LE edema.  Respiratory:  CTA bilaterally, no w/r/r. Normal respiratory effort. Abdomen:  soft, ntnd, positive bowel sounds no guarding or rebounding tenderness the right flank Skin:  no rash or induration seen on limited exam Musculoskeletal:  grossly normal tone BUE/BLE, good ROM, no bony abnormality Psychiatric:  grossly normal mood and affect, speech fluent and appropriate, AOx3 Neurologic:  CN 2-12 grossly intact, moves all extremities in  coordinated fashion, sensation intact  Labs on Admission: I have personally reviewed following labs and imaging studies  CBC:  Recent Labs Lab 07/13/16 1052 07/13/16 1428 07/17/16 0805  WBC  --  7.0 23.2*  NEUTROABS  --  5.3 19.0*  HGB 17.3* 14.2 18.8*  HCT 51.0 40.6 52.5*  MCV  --  93.5 91.9  PLT  --  190 638   Basic Metabolic Panel:  Recent Labs Lab 07/13/16 1052 07/17/16 0805  NA 139 134*  K 3.3* 4.3  CL 102 99*  CO2  --  22  GLUCOSE 108* 134*  BUN 7 25*  CREATININE 0.80 1.76*  CALCIUM  --  9.3   GFR: Estimated Creatinine Clearance: 39.9 mL/min (A) (by C-G formula based on SCr of 1.76 mg/dL (H)). Liver Function Tests:  Recent Labs Lab 07/17/16 0805  AST 17  ALT 15*  ALKPHOS 69  BILITOT 2.9*  PROT 7.1  ALBUMIN 3.6   No results for input(s): LIPASE, AMYLASE in the last 168 hours. No results for input(s): AMMONIA in the last 168 hours. Coagulation Profile:  Recent Labs Lab 07/13/16 1703 07/17/16 0805  INR 1.70 1.53   Cardiac Enzymes:  Recent Labs Lab 07/17/16 0805  TROPONINI 0.07*   BNP (last 3 results) No results for input(s): PROBNP in the last 8760 hours. HbA1C: No results for input(s): HGBA1C in the last 72 hours. CBG: No results for input(s): GLUCAP in the last 168 hours. Lipid Profile: No results for input(s): CHOL, HDL, LDLCALC, TRIG, CHOLHDL, LDLDIRECT in the last 72 hours. Thyroid Function Tests: No results for input(s): TSH, T4TOTAL, FREET4, T3FREE, THYROIDAB in the last 72 hours. Anemia Panel: No results for input(s): VITAMINB12, FOLATE, FERRITIN, TIBC, IRON, RETICCTPCT in the last 72 hours. Urine analysis:    Component Value Date/Time   COLORURINE AMBER (A) 07/17/2016 0950   APPEARANCEUR HAZY (A) 07/17/2016 0950   LABSPEC 1.027 07/17/2016 0950   PHURINE 5.0 07/17/2016 0950   GLUCOSEU NEGATIVE 07/17/2016 0950   GLUCOSEU NEGATIVE 12/13/2012 1514   HGBUR LARGE (A) 07/17/2016 0950   BILIRUBINUR NEGATIVE 07/17/2016 0950    KETONESUR NEGATIVE 07/17/2016 0950   PROTEINUR 100 (A) 07/17/2016 0950   UROBILINOGEN 0.2 12/13/2012 1514   NITRITE NEGATIVE 07/17/2016 0950   LEUKOCYTESUR MODERATE (A) 07/17/2016 0950    Creatinine Clearance: Estimated Creatinine Clearance: 39.9 mL/min (A) (by C-G formula based on SCr of 1.76 mg/dL (H)).  Sepsis Labs: @LABRCNTIP (procalcitonin:4,lacticidven:4) )No results found for this or any previous visit (from the past 240 hour(s)).   Radiological Exams on Admission: Dg Chest Port 1 View  Result Date: 07/17/2016 CLINICAL DATA:  Shortness of breath and chest pain today. EXAM: PORTABLE CHEST 1 VIEW COMPARISON:  CT chest 03/26/2016.  PA and lateral chest 06/29/2016. FINDINGS: The lungs are emphysematous but clear. Heart size is normal. Aortic atherosclerosis is noted. No pneumothorax or pleural effusion. No acute bony abnormality. IMPRESSION: No acute disease. Emphysema. Atherosclerosis. Electronically Signed   By: Inge Rise M.D.   On: 07/17/2016 08:33   Ct Renal Stone Study  Result Date: 07/17/2016 CLINICAL DATA:  Right lower quadrant pain today. Patient status post retrograde urethrogram for renal stones 07/13/2016. EXAM: CT ABDOMEN AND PELVIS WITHOUT CONTRAST TECHNIQUE: Multidetector CT imaging of the abdomen and pelvis was performed following the standard protocol without IV contrast. COMPARISON:  Image from the urethra g 07/13/2016. CT abdomen and pelvis 06/07/2016. FINDINGS: Lower chest: Heart size is upper normal. Mild dependent atelectasis noted. No pleural or pericardial effusion. Hepatobiliary: No focal liver abnormality is seen. No gallstones, gallbladder wall thickening, or biliary dilatation. Pancreas: A few calcifications are seen in the pancreas consistent chronic pancreatitis. 0.7 cm cystic lesion in the head of  the pancreas is unchanged and may be due to prior inflammation. Minimal prominence of the pancreatic duct is noted, stable. Spleen: Normal in size without focal  abnormality. Adrenals/Urinary Tract: The adrenal glands appear normal. There is new extensive stranding about the right kidney and ureter and moderate to moderately severe right hydronephrosis and dilatation of the right ureter due to a 0.4 cm in the distal right ureter. The stone is 4-5 cm proximal to the UVJ. The stone was in the proximal right ureter on the most recent CT. No other urinary tract stones are identified. The left kidney and urinary bladder appear normal. Stomach/Bowel: Stomach is within normal limits. Appendix appears normal. No evidence of bowel wall thickening, distention, or inflammatory changes. Vascular/Lymphatic: Aortic atherosclerosis. No enlarged abdominal or pelvic lymph nodes. Reproductive: Prostate is normal in size with some calcifications present. Other: Small volume of free pelvic fluid is identified. Musculoskeletal: No acute or focal abnormality. IMPRESSION: Moderate to moderately severe right hydronephrosis and extensive stranding about the right kidney and ureter are new since the most recent CT scan. Obstructing 0.4 cm stone is seen in the distal right ureter approximately 4-5 cm proximal to the UVJ. Atherosclerosis. Chronic calcific pancreatitis. Low attenuating lesion the pancreas is described on report of prior exam is unchanged. Electronically Signed   By: Inge Rise M.D.   On: 07/17/2016 09:46    EKG: Independently reviewed. Atrial fibrillation with rapid V-rate Ventricular premature complex Left axis deviation ST depression, probably rate related Assessment/Plan Principal Problem:   Atrial fibrillation with RVR (HCC) Active Problems:   COPD (chronic obstructive pulmonary disease) ([facility])   Current smoker   Hyperlipemia   Adjustment disorder with disturbance of emotion   Hydronephrosis   Kidney stone   Acute kidney injury ([place])   Elevated troponin   #1. Atrial fibrillation with rapid ventricular response. Likely related to inability to take medications  due to pain/nausea in the setting of severe flank pain related to obstructive ureteral calculus. EKG as noted above. Initial troponin 0.07. Cardizem drip initiated in the emergency department. I medications include metoprolol and Cardizem. -Admit to [employer] step down -Continue Cardizem drip -Add home meds once postop -Titrate per protocol   #2. Hydronephrosis related to obstructive calculus. He is afebrile hemodynamically stable does have a leukocytosis lactic acid within the limits of normal. urology consulted and requested patient be transferred to [facility] for OR today. -IV fluids -Nothing by mouth -Follow blood cultures -Continue Rocephin -Urine culture  #3.ureteral calculus. Obstructive. CT imaging above. Urology contacted per Dr Zenia Resides. Will need drain. Plan to take to OR today -Nothing by mouth -Supportive therapy -Monitor urine output  #4. Acute kidney injury. Related to above. Patient also with decreased oral intake last several days. -Gentle IV fluids -Monitor urine output -Hold nephrotoxins -Recheck in the morning  #5. Elevated troponin. Initial troponin 0.07. Second troponin 0.10. Patient with a history of elevated troponin but not typically this elevated. Denies any chest pain. Likely related to pain and or demand. EKG as noted above. -Cycle troponin -Continue home meds postoperatively -repeat EKG in the morning -cardiology consult  #6. COPD. Appears stable at baseline. Oxygen saturation level greater than 90% on room air. -Continue home inhalers  #7. A. Fib.Mali score 5. Patient is on Coumadin as well as beta blocker and diltiazem. INR is 1.5. -See #1 -Holding Coumadin for now as he is going to the OR today  #8. Depression/anxiety. Patient with recent hospitalization for suicide ideation. Stable. -denies wish  to harm self -continue home meds as needed    DVT prophylaxis: scd  Code Status: full  Family Communication: none present  Disposition Plan:  home  Consults called: urology, [place], [facility] Admission status: inpatient    Radene Gunning MD [facility]  If 7PM-7AM, please contact night-coverage [email or web address] Password [email or web address]  07/17/2016, 11:28 AM

## 2016-07-17 NOTE — ED Notes (Signed)
Per Sunday Corn cancel transfer to [facility]

## 2016-07-17 NOTE — ED Notes (Signed)
Report given to [facility] RN Elmo Putt

## 2016-07-17 NOTE — Anesthesia Procedure Notes (Signed)
Procedure Name: LMA Insertion Date/Time: 07/17/2016 8:57 PM Performed by: Enrigue Catena E Pre-anesthesia Checklist: Patient identified, Emergency Drugs available, Suction available and Patient being monitored Patient Re-evaluated:Patient Re-evaluated prior to inductionOxygen Delivery Method: Circle system utilized Preoxygenation: Pre-oxygenation with 100% oxygen Intubation Type: IV induction Ventilation: Mask ventilation without difficulty LMA: LMA with gastric port inserted LMA Size: 5.0 Tube type: Oral Number of attempts: 1 Airway Equipment and Method: Oral airway Placement Confirmation: positive ETCO2 Tube secured with: Tape Dental Injury: Teeth and Oropharynx as per pre-operative assessment

## 2016-07-17 NOTE — Op Note (Addendum)
Preoperative diagnosis: Right distal ureteral stone Postoperative diagnosis: Right distal ureteral stone  Procedure: Cystoscopy with right ureteroscopy, holmium laser lithotripsy, stone basket extraction, right retrograde pyelogram and right ureteral stent placement   Surgeon: Junious Silk  Anesthesia: Gen.  Indication for procedure: 66 year old with recurrent right distal ureteral stone and uncontrolled pain wish to proceed with definitive treatment.  Findings: On cystoscopy there was no stone or foreign body in the bladder. The urethra and the prostatic urethra were unremarkable. The bladder mucosa appeared normal. The stone was noted to be crowning from the right ureteral orifice. There was clear left efflux.  On ureteroscopy the stone was found to be impacted in the ureterovesical tunnel pushed into the proximal ureter after lasering to break off the front edge. There was a significant impaction site and the ureterovesical tunnel. The remainder of the distal ureter was quite tortuous.  Right retrograde pyelogram this outlined a duplicated system with bifurcation at the SI joint with 1 ureter going up to the upper pole moiety and the other ureter to the lower moiety. Given the lower pole moiety was a little bit larger and more at an angle and decided to put the proximal coil of the stent here. The mid and distal ureter appeared normal without filling defect or stricture. There was no extravasation of any contrast.  Exam under anesthesia-penis circumcised and without mass or lesion. Testicles descended bilaterally. Prostate was about 40 g and palpably normal, smooth. No hard area or nodule.  Description of procedure: After consent was obtained patient was brought to the operating room and placed in lithotomy position. He was prepped and draped in the usual sterile fashion. The cystoscope was passed per urethra and the bladder inspected. To stone was noted be crowning from the ureteral orifice and I  tried to simply grab the leading edge with a 0 tip basket but I could not. Therefore, I passed a sensor wire adjacent to the stone without difficulty and that seemed to go up to the upper moiety ureter. I then passed a semirigid ureteroscope and thought I could see the stone in the intravesical tunnel but it seemed to be covered with tissue or protein and I could not grasp it with a Nitinol basket despite trying several times. The space was tight and I could not get the loops of the basket around the stone. Therefore I deployed a 200  laser fiber and began to perform lithotripsy and fragment the stone and 0.8 and 8. This loosened from an embedded state in the wall and I was able to push it into the proximal ureter proper. Now I had a good look at and was able to surround it with the 0 tip basket and withdrawal it without difficulty. I then repassed the scope and inspected up through the proximal ureter which was surprisingly tortuous and show no other stone. There was some edema and erythema of the intravesical tunnel and the prior impaction site of the stone therefore I decided to leave a stent. Because the CT showed no other stones, I did not feel proximal URS was indicated and it may have been difficult. To confirm wire placement of past the open-ended catheter and withdrew the wire. There was a good hydronephrotic drip and retrograde injection of contrast revealed a mildly dilated ureter and upper moiety. I then pulled the catheter back to about the SI joint was able to start to visualize the lower moiety ureter and some mild dilation of the system. Because that was more  than an angle I used an angled glide to get access to the lower moiety ureter and coiled the wire there. The wire was backloaded on the cystoscope and a 6 x 26 cm stent was advanced. A good coil was seen in the collecting system of the lower moiety and a good coil in the bladder. The bladder was drained and the scope removed. I left a string on  the stent. The patient tolerated the procedure well and he was awakened and taken to the recovery room in stable condition.  Complications: None  Blood loss: Minimal  Special months: Stone fragments to office lab  Drains: 6 x 26 cm right ureteral stent

## 2016-07-17 NOTE — ED Notes (Signed)
Patient taken to CT.

## 2016-07-17 NOTE — Consult Note (Addendum)
Consult: right ureteral stone  Requested by: Dr. Lacretia Leigh  History of Present Illness: 66 yo found to have right mid ureteral stone. He was placed on medical expulsive therapy and his stone failed to progress so we discussed seeding with ureteroscopic removal of the stone. He indicates he has not seen his stone pass. He has not had any flank pain. His preoperative KUB did not reveal a ureteral stone although it could have been in the RLP. He was taken to OR 6/5 and ureteroscopy with Dr. Karsten Ro failed to ID any stone. No stent was left. Had a brief hospitalization for suicidal ideations. He also has a history of atrial fibrillation on Coumadin as well as his Cardizem. Patient presented today with a few days of recurrent right flank pain which was severe today. He was found to be in A fib RVR, wbc 23, cr 1.76, trop 0.1, ua no bac but tntc wbc and rbc.  This evening he continues to have significant right flank and RLQ pain. He has not seen a stone pass. He's been stabilized and cleared by cardiology for the procedure. He denies dysuria or fever/chills.     Past Medical History:  Diagnosis Date  . AAA (abdominal aortic aneurysm) ([place])    per last CT 03-26-2016---  3.9cm  . Anticoagulated on Coumadin   . Arthritis of spine ([place])   . Benign localized prostatic hyperplasia with lower urinary tract symptoms (LUTS)   . Chronic atrial fibrillation ([place]) cardiolgoist -- dr Stanford Breed   dx 2009  . Complication of anesthesia    per pt today (07-08-2016) had problem after having anesthesia 12/ 2016 surgery at [facility] "not good" went to ED ( in epic ED visit 01/ 2017 dx severe episode of major depression disorder and admitted to [facility]  . COPD with emphysema ([place])   . Dyspnea on exertion   . Full dentures   . GERD (gastroesophageal reflux disease)   . Hematuria   . History of amaurosis fugax    right eye 02-08-2015 -- resolved /  documented in epic possible left eye amaurosis fugax 11/ 2008 (pt denies) /  per last MRI show previous bilateral occipital lobe infarcts  . History of concussion    as child  . History of CVA (cerebrovascular accident) 10/ 2016  and per pt Dec 2017 in [place]   neurologist-  dr Leonie Man-- per note silent infarcts on MRI-- multiple acute infarcts bilateral cerebullm, right temporal lobe, and bilateral occipital lobes due to embolic phenomena (atrial fib.)  . History of seizures as a child   . Hyperlipidemia   . Hypertension   . Left arm numbness    occasional numbess post residual axilla arterial occlusion and surgery  . Major depressive disorder    hx severe episode without psychosis 02/2015 w/ homicidal ideation  . Narcotic dependence ([place])   . PAD (peripheral artery disease) ([place])    followed by dr fields-- s/p  left axilla to branchial and left axilla to ulnar bypass graft due to ischemic hand  . Pulmonary nodule    per CT 03-26-2016  right lower lobe  . PVD (peripheral vascular disease) ([place])   . Right ureteral stone   . Senile purpura ([place])   . Weakness of left arm    residual from axilla arterial occlusion and post surgery  . Wears glasses    Past Surgical History:  Procedure Laterality Date  . ARCH AORTOGRAM N/A 04/05/2014   Procedure: ARCH AORTOGRAM, FIRST ORDER CATHETERIZATION LEFT  SUBCLAVIAN ARTERY;  Surgeon: Elam Dutch, MD;  Location: [facility];  Service: Vascular;  Laterality: N/A;  . AXILLARY-FEMORAL BYPASS GRAFT Left 04/08/2014   Procedure: LEFT AXILLARY ARTERY TO BRACHIAL ARTERY BYPASS USING NON REVERSE LEFT GREATER SAPHENOUS VEIN ,LIGATION OF LEFT AXILLARY ARTERY ANEURYSM;  Surgeon: Elam Dutch, MD;  Location: [place];  Service: Vascular;  Laterality: Left;  . BYPASS AXILLA/BRACHIAL ARTERY Left 01/27/2015   Procedure: LEFT BRACHIAL-ULAR ARTERY BYPASS USING GREATER SAPHENOUS VEIN;  Surgeon: Elam Dutch, MD;  Location: [place];  Service: Vascular;  Laterality: Left;  . CYSTOSCOPY/URETEROSCOPY/HOLMIUM LASER/STENT PLACEMENT Right 07/13/2016    Procedure: CYSTOSCOPY, RIGHT URETEROSCOPY RETROGRADE PYELOGRAM;  Surgeon: Kathie Rhodes, MD;  Location: [facility];  Service: Urology;  Laterality: Right;  . EMBOLECTOMY Left 04/05/2014   Procedure: THROMBO ENDARTERECTOMY OF LEFT BRACHIAL, RADIAL AND ULNAR ARTERY,  Left Radial artery cut down and radial artery thrombectomy.;  Surgeon: Elam Dutch, MD;  Location: [facility] OR;  Service: Vascular;  Laterality: Left;  . facial cyst Right    cyst removal.   . PATCH ANGIOPLASTY Left 04/05/2014   Procedure: LEFT ARM VEIN PATCH ANGIOPLASTY;  Surgeon: Elam Dutch, MD;  Location: [place];  Service: Vascular;  Laterality: Left;  . PERIPHERAL VASCULAR CATHETERIZATION N/A 01/22/2015   Procedure: Aortic Arch Angiography;  Surgeon: Elam Dutch, MD;  Location: [facility];  Service: Cardiovascular;  Laterality: N/A;  . THROMBECTOMY BRACHIAL ARTERY Left 11/20/2014   Procedure: 1.  Thrombectomy Left Axilo-Brachial Bypass  2.  Thromboendarterecotmy of Left Brachial Artery with Fogarty Thrombectomy of Radial and Ulnar Arteries with Dacron patch angioplasty Left Brachial Artery. 3. Intraoperative  Arteriogram times four.;  Surgeon: Mal Misty, MD;  Location: [place];  Service: Vascular;  Laterality: Left;  . TRANSTHORACIC ECHOCARDIOGRAM  11/25/2014   moderate concentric LVH, ef 60-65%, unable to evaluation LVDF due to atrial fib/  mild MR/  severe LAE  . VEIN HARVEST Left 04/08/2014   Procedure: LEFT GREATER SAPHENOUS VEIN HARVEST;  Surgeon: Elam Dutch, MD;  Location: [place];  Service: Vascular;  Laterality: Left;  Marland Kitchen VEIN HARVEST Right 01/27/2015   Procedure: RIGHT GREATER SAPHENOUS VEIN HARVEST;  Surgeon: Elam Dutch, MD;  Location: [place];  Service: Vascular;  Laterality: Right;    Home Medications:   (Not in a hospital admission) Allergies:  Allergies  Allergen Reactions  . Oxycodone Itching    Family History  Problem Relation Age of Onset  . Hypertension Mother    . Lung cancer Mother   . Stomach cancer Father        died age 55  . Cancer Sister   . Early death Neg Hx   . Heart disease Neg Hx   . Hyperlipidemia Neg Hx   . Kidney disease Neg Hx   . Stroke Neg Hx    Social History:  reports that he has been smoking Cigarettes.  He has a 102.00 pack-year smoking history. He has never used smokeless tobacco. He reports that he drinks alcohol. He reports that he does not use drugs.  ROS: A complete review of systems was performed.  All systems are negative except for pertinent findings as noted. Review of Systems  All other systems reviewed and are negative.   Physical Exam:  Vital signs in last 24 hours: Temp:  [98.7 F (37.1 C)] 98.7 F (37.1 C) (06/09 0803) Pulse Rate:  [27-117] 100 (06/09 1315) Resp:  [15-22] 15 (06/09 1330) BP: (  114-152)/(81-110) 133/88 (06/09 1330) SpO2:  [90 %-98 %] 98 % (06/09 1300) Weight:  [76.2 kg (168 lb)] 76.2 kg (168 lb) (06/09 0804) General:  Alert and oriented, No acute distress HEENT: Normocephalic, atraumatic Neck: No JVD or lymphadenopathy Cardiovascular: Regular rate and rhythm Lungs: Clear bilaterally Abdomen: Soft, nontender, nondistended, no abdominal masses Back: No CVA tenderness Extremities: No edema Neurologic: Grossly intact  Laboratory Data:  Results for orders placed or performed during the hospital encounter of 07/17/16 (from the past 24 hour(s))  CBC with Differential     Status: Abnormal   Collection Time: 07/17/16  8:05 AM  Result Value Ref Range   WBC 23.2 (H) 4.0 - 10.5 K/uL   RBC 5.71 4.22 - 5.81 MIL/uL   Hemoglobin 18.8 (H) 13.0 - 17.0 g/dL   HCT 52.5 (H) 39.0 - 52.0 %   MCV 91.9 78.0 - 100.0 fL   MCH 32.9 26.0 - 34.0 pg   MCHC 35.8 30.0 - 36.0 g/dL   RDW 13.3 11.5 - 15.5 %   Platelets 196 150 - 400 K/uL   Neutrophils Relative % 81 %   Neutro Abs 19.0 (H) 1.7 - 7.7 K/uL   Lymphocytes Relative 10 %   Lymphs Abs 2.2 0.7 - 4.0 K/uL   Monocytes Relative 9 %   Monocytes  Absolute 2.0 (H) 0.1 - 1.0 K/uL   Eosinophils Relative 0 %   Eosinophils Absolute 0.0 0.0 - 0.7 K/uL   Basophils Relative 0 %   Basophils Absolute 0.0 0.0 - 0.1 K/uL  Comprehensive metabolic panel     Status: Abnormal   Collection Time: 07/17/16  8:05 AM  Result Value Ref Range   Sodium 134 (L) 135 - 145 mmol/L   Potassium 4.3 3.5 - 5.1 mmol/L   Chloride 99 (L) 101 - 111 mmol/L   CO2 22 22 - 32 mmol/L   Glucose, Bld 134 (H) 65 - 99 mg/dL   BUN 25 (H) 6 - 20 mg/dL   Creatinine, Ser 1.76 (H) 0.61 - 1.24 mg/dL   Calcium 9.3 8.9 - 10.3 mg/dL   Total Protein 7.1 6.5 - 8.1 g/dL   Albumin 3.6 3.5 - 5.0 g/dL   AST 17 15 - 41 U/L   ALT 15 (L) 17 - 63 U/L   Alkaline Phosphatase 69 38 - 126 U/L   Total Bilirubin 2.9 (H) 0.3 - 1.2 mg/dL   GFR calc non Af Amer 39 (L) >60 mL/min   GFR calc Af Amer 45 (L) >60 mL/min   Anion gap 13 5 - 15  Troponin I     Status: Abnormal   Collection Time: 07/17/16  8:05 AM  Result Value Ref Range   Troponin I 0.07 (HH) <0.03 ng/mL  Protime-INR     Status: Abnormal   Collection Time: 07/17/16  8:05 AM  Result Value Ref Range   Prothrombin Time 18.6 (H) 11.4 - 15.2 seconds   INR 1.53   Urinalysis, Routine w reflex microscopic     Status: Abnormal   Collection Time: 07/17/16  9:50 AM  Result Value Ref Range   Color, Urine AMBER (A) YELLOW   APPearance HAZY (A) CLEAR   Specific Gravity, Urine 1.027 1.005 - 1.030   pH 5.0 5.0 - 8.0   Glucose, UA NEGATIVE NEGATIVE mg/dL   Hgb urine dipstick LARGE (A) NEGATIVE   Bilirubin Urine NEGATIVE NEGATIVE   Ketones, ur NEGATIVE NEGATIVE mg/dL   Protein, ur 100 (A) NEGATIVE mg/dL   Nitrite NEGATIVE  NEGATIVE   Leukocytes, UA MODERATE (A) NEGATIVE   RBC / HPF TOO NUMEROUS TO COUNT 0 - 5 RBC/hpf   WBC, UA TOO NUMEROUS TO COUNT 0 - 5 WBC/hpf   Bacteria, UA NONE SEEN NONE SEEN   Squamous Epithelial / LPF NONE SEEN NONE SEEN   Mucous PRESENT   Troponin I     Status: Abnormal   Collection Time: 07/17/16 10:47 AM   Result Value Ref Range   Troponin I 0.10 (HH) <0.03 ng/mL  I-Stat beta hCG blood, ED ([facility], [facility], [facility] only)     Status: None   Collection Time: 07/17/16 10:53 AM  Result Value Ref Range   I-stat hCG, quantitative <5.0 <5 mIU/mL   Comment 3          I-Stat CG4 Lactic Acid, ED     Status: None   Collection Time: 07/17/16 10:55 AM  Result Value Ref Range   Lactic Acid, Venous 1.13 0.5 - 1.9 mmol/L   No results found for this or any previous visit (from the past 240 hour(s)). Creatinine:  Recent Labs  07/13/16 1052 07/17/16 0805  CREATININE 0.80 1.76*    Impression/Assessment:  Right ureteral stone with AKI, elevated wbc and a fib rvr -   Plan:  Pt continues to have significant pain. We discussed nature r/b of continued surveillance or proceeding with repeat cysto/RGP/stent possible URS. Discussed these small stones are difficult sometimes to survey and to find with URS. Even today, he could have passed the stone and he may have a negative URS. We discussed further imaging. We also discussed if there is a lot of edema he may need a stent and a staged procedure.   Discussed with NP Black and Dr. Charlies Silvers.     Saesha Llerenas 07/17/2016, 2:27 PM

## 2016-07-17 NOTE — ED Notes (Signed)
Patient given water per okay from Dr. Zenia Resides

## 2016-07-17 NOTE — Anesthesia Preprocedure Evaluation (Signed)
Anesthesia Evaluation  Patient identified by MRN, date of birth, ID band Patient awake    Reviewed: Allergy & Precautions, NPO status , Patient's Chart, lab work & pertinent test results  History of Anesthesia Complications (+) history of anesthetic complications  Airway Mallampati: I  TM Distance: >3 FB Neck ROM: Full    Dental  (+) Lower Dentures, Upper Dentures   Pulmonary COPD,  COPD inhaler, Current Smoker,    breath sounds clear to auscultation + decreased breath sounds      Cardiovascular hypertension, Pt. on medications + Peripheral Vascular Disease  + dysrhythmias  Rhythm:Irregular Rate:Normal  AAA- 3.9 cm stable Elevated troponin today S/P Left subclavian - brachial bypass with insitu reverse saphenous vein Chronic atrial fibrillation   Neuro/Psych  Headaches, Seizures -, Well Controlled,  PSYCHIATRIC DISORDERS Depression Hx/o Seizures as a child CVA, No Residual Symptoms    GI/Hepatic GERD  Medicated and Controlled,  Endo/Other  Hyperlipidemia  Renal/GU Renal InsufficiencyRenal diseaseRight distal ureteral calculus with right hydronephrosis   BPH    Musculoskeletal  (+) Arthritis , Osteoarthritis,    Abdominal   Peds  Hematology Chronic anticoagulation - Coumadin last dose 07/15/2016 Probable urosepsis   Anesthesia Other Findings   Reproductive/Obstetrics                             Anesthesia Physical Anesthesia Plan  ASA: III and emergent  Anesthesia Plan: General   Post-op Pain Management:    Induction: Intravenous  PONV Risk Score and Plan: 3 and Ondansetron, Dexamethasone, Propofol and Midazolam  Airway Management Planned: LMA  Additional Equipment:   Intra-op Plan:   Post-operative Plan: Extubation in OR  Informed Consent: I have reviewed the patients History and Physical, chart, labs and discussed the procedure including the risks, benefits and  alternatives for the proposed anesthesia with the patient or authorized representative who has indicated his/her understanding and acceptance.     Plan Discussed with: CRNA, Anesthesiologist and Surgeon  Anesthesia Plan Comments:         Anesthesia Quick Evaluation

## 2016-07-18 ENCOUNTER — Encounter (HOSPITAL_COMMUNITY): Payer: Self-pay | Admitting: Urology

## 2016-07-18 DIAGNOSIS — I4891 Unspecified atrial fibrillation: Secondary | ICD-10-CM

## 2016-07-18 LAB — URINE CULTURE: Culture: <10000 — AB

## 2016-07-18 MED ORDER — METOPROLOL TARTRATE 50 MG PO TABS: 50.0000 mg | ORAL_TABLET | Freq: Two times a day (BID) | ORAL | 0 refills | Status: DC

## 2016-07-18 MED ORDER — CIPROFLOXACIN HCL 500 MG PO TABS: 500.0000 mg | ORAL_TABLET | Freq: Two times a day (BID) | ORAL | 0 refills | Status: DC

## 2016-07-18 MED ORDER — SODIUM CHLORIDE 0.9 % IJ SOLN
INTRAMUSCULAR | Status: AC
  Filled 2016-07-18: qty 10

## 2016-07-18 MED ORDER — CIPROFLOXACIN HCL 500 MG PO TABS
500.0000 mg | ORAL_TABLET | Freq: Two times a day (BID) | ORAL | Status: DC
  Administered 2016-07-18: 500 mg via ORAL
  Filled 2016-07-18: qty 1

## 2016-07-18 MED ORDER — DILTIAZEM HCL ER COATED BEADS 240 MG PO CP24
240.0000 mg | ORAL_CAPSULE | Freq: Every day | ORAL | Status: DC
  Administered 2016-07-18: 240 mg via ORAL
  Filled 2016-07-18: qty 1
  Filled 2016-07-18: qty 2

## 2016-07-18 MED ORDER — METOPROLOL TARTRATE 25 MG PO TABS
50.0000 mg | ORAL_TABLET | Freq: Two times a day (BID) | ORAL | Status: DC
  Administered 2016-07-18: 50 mg via ORAL
  Filled 2016-07-18: qty 2

## 2016-07-18 NOTE — Anesthesia Postprocedure Evaluation (Signed)
Anesthesia Post Note  Patient: BENI TURRELL  Procedure(s) Performed: Procedure(s) (LRB): CYSTOSCOPY WITH RETROGRADE PYELOGRAM, URETEROSCOPY AND STENT PLACEMENT,LASER (Right)     Patient location during evaluation: PACU Anesthesia Type: General Level of consciousness: awake and alert and oriented Pain management: pain level controlled Vital Signs Assessment: post-procedure vital signs reviewed and stable Respiratory status: spontaneous breathing, nonlabored ventilation, respiratory function stable and patient connected to nasal cannula oxygen Cardiovascular status: blood pressure returned to baseline and stable Postop Assessment: no signs of nausea or vomiting Anesthetic complications: no                  Ancelmo Hunt A.

## 2016-07-18 NOTE — Discharge Summary (Signed)
Physician Discharge Summary  ANTUANE EASTRIDGE MVH:[id number] DOB: 02/13/1950 DOA: 07/17/2016  PCP: Shon Baton, MD  Admit date: 07/17/2016 Discharge date: 07/18/2016  Recommendations for Outpatient Follow-up:  1. Pt will need to follow up with PCP in 2-3 weeks post discharge 2. Please obtain BMP to evaluate electrolytes and kidney function 3. Please also check CBC to evaluate Hg and Hct levels   Discharge Diagnoses:  Principal Problem:   Atrial fibrillation with RVR ([facility]) Active Problems:   COPD (chronic obstructive pulmonary disease) (HCC)   Current smoker   Hyperlipemia   Adjustment disorder with disturbance of emotion   Hydronephrosis   Kidney stone   Acute kidney injury (HCC)   Elevated troponin  Discharge Condition: Stable  Diet recommendation: Heart healthy diet discussed in details   History of present illness:  66 y.o. male with medical history significant for A. fib on Coumadin, hyperlipidemia, PAD, embolic stroke, COPD, presented to ED with main concern of right flank pain with nausea and palpitations.  Hospital Course:  Principal Problem:   Atrial fibrillation with RVR [facility]) - cardiology has seen pt on discharge - recommended to lower the dose of beta blocker and resume the rest of the home medications as per previous regimen   Active Problems: Right distal ureteral stone  - s/p right ureteroscopy, laser lithotripsy, stone removal and stent - post op day #1 - pt reports feeling better, insists on going home - urology has cleared pt for discharge  - complete therapy with Cipro on discharge   Leukocytosis - from acute illness - will need to have outpatient follow up - we recommended one blood check here but pt declined saying he wants to leave   Acute kidney injury - from acute problem  - outpatient follow up recommended as pt has declined repeat blood work this AM   Procedures/Studies: Dg Abd 1 View  Result Date: 07/17/2016 CLINICAL DATA:  Cysto  with right ureteral stent placement EXAM: ABDOMEN - 1 VIEW FLUOROSCOPY TIME:  1 minutes 19 seconds 18.59 mGy COMPARISON:  CT abdomen/ pelvis dated 07/17/2016 FINDINGS: Contrast opacifies a duplicated/partially duplicated right renal collecting system with a stent in the dilated lower pole moiety. IMPRESSION: Duplicated right renal collecting system with a stent in the lower pole moiety, as above. Electronically Signed   By: Julian Hy M.D.   On: 07/17/2016 23:37   Kub Day Of Procedure  Result Date: 07/13/2016 CLINICAL DATA:  Right ureteral calculus EXAM: ABDOMEN - 1 VIEW COMPARISON:  07/02/2014 and prior studies FINDINGS: 5 mm calcification projects over the lower pole right renal collecting system. Normal bowel gas pattern. Scattered left upper quadrant calcifications in the expected region of the pancreas. Pelvic vascular and prostatic calcifications as before. Regional bones unremarkable. IMPRESSION: 1. Possible right nephrolithiasis. 2. Normal  bowel gas pattern. Electronically Signed   By: Lucrezia Europe M.D.   On: 07/13/2016 10:28   Dg Chest Port 1 View  Result Date: 07/17/2016 CLINICAL DATA:  Shortness of breath and chest pain today. EXAM: PORTABLE CHEST 1 VIEW COMPARISON:  CT chest 03/26/2016.  PA and lateral chest 06/29/2016. FINDINGS: The lungs are emphysematous but clear. Heart size is normal. Aortic atherosclerosis is noted. No pneumothorax or pleural effusion. No acute bony abnormality. IMPRESSION: No acute disease. Emphysema. Atherosclerosis. Electronically Signed   By: Inge Rise M.D.   On: 07/17/2016 08:33   Dg C-arm 1-60 Min-no Report  Result Date: 07/17/2016 Fluoroscopy was utilized by the requesting physician.  No radiographic  interpretation.   Ct Renal Stone Study  Result Date: 07/17/2016 CLINICAL DATA:  Right lower quadrant pain today. Patient status post retrograde urethrogram for renal stones 07/13/2016. EXAM: CT ABDOMEN AND PELVIS WITHOUT CONTRAST TECHNIQUE: Multidetector  CT imaging of the abdomen and pelvis was performed following the standard protocol without IV contrast. COMPARISON:  Image from the urethra g 07/13/2016. CT abdomen and pelvis 06/07/2016. FINDINGS: Lower chest: Heart size is upper normal. Mild dependent atelectasis noted. No pleural or pericardial effusion. Hepatobiliary: No focal liver abnormality is seen. No gallstones, gallbladder wall thickening, or biliary dilatation. Pancreas: A few calcifications are seen in the pancreas consistent chronic pancreatitis. 0.7 cm cystic lesion in the head of the pancreas is unchanged and may be due to prior inflammation. Minimal prominence of the pancreatic duct is noted, stable. Spleen: Normal in size without focal abnormality. Adrenals/Urinary Tract: The adrenal glands appear normal. There is new extensive stranding about the right kidney and ureter and moderate to moderately severe right hydronephrosis and dilatation of the right ureter due to a 0.4 cm in the distal right ureter. The stone is 4-5 cm proximal to the UVJ. The stone was in the proximal right ureter on the most recent CT. No other urinary tract stones are identified. The left kidney and urinary bladder appear normal. Stomach/Bowel: Stomach is within normal limits. Appendix appears normal. No evidence of bowel wall thickening, distention, or inflammatory changes. Vascular/Lymphatic: Aortic atherosclerosis. No enlarged abdominal or pelvic lymph nodes. Reproductive: Prostate is normal in size with some calcifications present. Other: Small volume of free pelvic fluid is identified. Musculoskeletal: No acute or focal abnormality. IMPRESSION: Moderate to moderately severe right hydronephrosis and extensive stranding about the right kidney and ureter are new since the most recent CT scan. Obstructing 0.4 cm stone is seen in the distal right ureter approximately 4-5 cm proximal to the UVJ. Atherosclerosis. Chronic calcific pancreatitis. Low attenuating lesion the  pancreas is described on report of prior exam is unchanged. Electronically Signed   By: Inge Rise M.D.   On: 07/17/2016 09:46     Discharge Exam: Vitals:   07/18/16 0700 07/18/16 0800  BP: (!) 171/99 (!) 172/151  Pulse: (!) 35 (!) 120  Resp: 16 14  Temp:  98.3 F (36.8 C)   Vitals:   07/18/16 0500 07/18/16 0600 07/18/16 0700 07/18/16 0800  BP: (!) 148/101 (!) 145/93 (!) 171/99 (!) 172/151  Pulse: (!) 32 81 (!) 35 (!) 120  Resp: 15 17 16 14   Temp:    98.3 F (36.8 C)  TempSrc:    Oral  SpO2: 98% 98% 93% 97%  Weight: 77.5 kg (170 lb 13.7 oz)     Height:        General: Pt is alert, follows commands appropriately, not in acute distress Cardiovascular: Irregular rhythm and rate, no murmurs, no rubs, no gallops Respiratory: Clear to auscultation bilaterally, no wheezing, no crackles, no rhonchi Abdominal: Soft, non tender, non distended, bowel sounds +, no guarding Extremities: no edema, no cyanosis, pulses palpable bilaterally DP and PT Neuro: Grossly nonfocal  Discharge Instructions  Discharge Instructions    Diet - low sodium heart healthy    Complete by:  As directed    Increase activity slowly    Complete by:  As directed      Allergies as of 07/18/2016      Reactions   Oxycodone Itching      Medication List    TAKE these medications   ALPRAZolam 0.5 MG  tablet Commonly known as:  XANAX Take 0.5 mg by mouth at bedtime as needed for anxiety or sleep.   atorvastatin 40 MG tablet Commonly known as:  LIPITOR Take 1 tablet (40 mg total) by mouth daily. For high cholesterol   calcium carbonate 500 MG chewable tablet Commonly known as:  TUMS - dosed in mg elemental calcium Chew 1 tablet by mouth 2 (two) times daily with a meal.   diltiazem 240 MG 24 hr capsule Commonly known as:  TIAZAC TAKE ONE (1) CAPSULE BY MOUTH EACH DAY What changed:  See the new instructions.   furosemide 20 MG tablet Commonly known as:  LASIX Take 20 mg by mouth daily.    gabapentin 100 MG capsule Commonly known as:  NEURONTIN Take 1 capsule (100 mg total) by mouth at bedtime. For agitation   metoprolol tartrate 50 MG tablet Commonly known as:  LOPRESSOR Take 1 tablet (50 mg total) by mouth 2 (two) times daily. What changed:  medication strength  See the new instructions.   traMADol 50 MG tablet Commonly known as:  ULTRAM Take 50-100 mg by mouth every 6 (six) hours as needed for moderate pain. What changed:  Another medication with the same name was removed. Continue taking this medication, and follow the directions you see here.   TYLENOL 500 MG tablet Generic drug:  acetaminophen Take 500 mg by mouth every 6 (six) hours as needed for mild pain.   warfarin 5 MG tablet Commonly known as:  COUMADIN Take 1 tablet (5 mg total) by mouth daily at 6 PM. Take as directed.  Dose may vary pending INR: For blood clot prevention      Follow-up Information    Kathie Rhodes, MD Follow up.   Specialty:  Urology Why:  4-6 weeks  Contact information: [place] [address] [phone]        Shon Baton, MD Follow up.   Specialty:  Internal Medicine Contact information: [place] [address] [phone]            The results of significant diagnostics from this hospitalization (including imaging, microbiology, ancillary and laboratory) are listed below for reference.     Microbiology: Recent Results (from the past 240 hour(s))  MRSA PCR Screening     Status: None   Collection Time: 07/17/16  4:19 PM  Result Value Ref Range Status   MRSA by PCR NEGATIVE NEGATIVE Final    Comment:        The GeneXpert MRSA Assay (FDA approved for NASAL specimens only), is one component of a comprehensive MRSA colonization surveillance program. It is not intended to diagnose MRSA infection nor to guide or monitor treatment for MRSA infections.      Labs: Basic Metabolic Panel:  Recent Labs Lab 07/13/16 1052  07/17/16 0805  NA 139 134*  K 3.3* 4.3  CL 102 99*  CO2  --  22  GLUCOSE 108* 134*  BUN 7 25*  CREATININE 0.80 1.76*  CALCIUM  --  9.3   Liver Function Tests:  Recent Labs Lab 07/17/16 0805  AST 17  ALT 15*  ALKPHOS 69  BILITOT 2.9*  PROT 7.1  ALBUMIN 3.6   CBC:  Recent Labs Lab 07/13/16 1052 07/13/16 1428 07/17/16 0805  WBC  --  7.0 23.2*  NEUTROABS  --  5.3 19.0*  HGB 17.3* 14.2 18.8*  HCT 51.0 40.6 52.5*  MCV  --  93.5 91.9  PLT  --  190  196   Cardiac Enzymes:  Recent Labs Lab 07/17/16 0805 07/17/16 1047  TROPONINI 0.07* 0.10*   SIGNED: Time coordinating discharge:  30 minutes  Faye Ramsay, MD  [facility] 07/18/2016, 10:15 AM Pager [phone]  If 7PM-7AM, please contact night-coverage [email or web address] Password [email or web address]

## 2016-07-18 NOTE — Progress Notes (Signed)
Progress Note  Patient Name: [name] Date of Encounter: 07/18/2016  Primary Cardiologist: Stanford Breed  Subjective   Pt feeling better this AM  Breathing is good  No CP    Inpatient Medications    Scheduled Meds:  Continuous Infusions: . sodium chloride 125 mL/hr at 07/18/16 0700  . cefTRIAXone (ROCEPHIN)  IV Stopped (07/17/16 1153)  . diltiazem (CARDIZEM) infusion 5 mg/hr (07/18/16 0700)   PRN Meds: acetaminophen, morphine injection, ondansetron (ZOFRAN) IV   Vital Signs    Vitals:   07/18/16 0324 07/18/16 0400 07/18/16 0500 07/18/16 0600  BP:  (!) 164/101 (!) 148/101 (!) 145/93  Pulse:  75 (!) 32 81  Resp:  18 15 17   Temp: 97.7 F (36.5 C)     TempSrc: Oral     SpO2:  99% 98% 98%  Weight:   77.5 kg (170 lb 13.7 oz)   Height:        Intake/Output Summary (Last 24 hours) at 07/18/16 0732 Last data filed at 07/18/16 0700  Gross per 24 hour  Intake           4363.5 ml  Output             1555 ml  Net           2808.5 ml   Filed Weights   07/17/16 0804 07/17/16 1817 07/18/16 0500  Weight: 76.2 kg (168 lb) 74.3 kg (163 lb 12.8 oz) 77.5 kg (170 lb 13.7 oz)    Telemetry    Atrial fib  80s   - Personally Reviewed  ECG      Physical Exam   GEN: No acute distress.   Neck: No JVD Cardiac: Irreg irreg  , no murmurs, rubs, or gallops.  Respiratory: Clear to auscultation bilaterally. GI: Soft, nontender, non-distended  MS: No edema; No deformity. Neuro:  Nonfocal  Psych: Normal affect   Labs    Chemistry Recent Labs Lab 07/13/16 1052 07/17/16 0805  NA 139 134*  K 3.3* 4.3  CL 102 99*  CO2  --  22  GLUCOSE 108* 134*  BUN 7 25*  CREATININE 0.80 1.76*  CALCIUM  --  9.3  PROT  --  7.1  ALBUMIN  --  3.6  AST  --  17  ALT  --  15*  ALKPHOS  --  69  BILITOT  --  2.9*  GFRNONAA  --  39*  GFRAA  --  45*  ANIONGAP  --  13     Hematology Recent Labs Lab 07/13/16 1052 07/13/16 1428 07/17/16 0805  WBC  --  7.0 23.2*  RBC  --  4.34  5.71  HGB 17.3* 14.2 18.8*  HCT 51.0 40.6 52.5*  MCV  --  93.5 91.9  MCH  --  32.7 32.9  MCHC  --  35.0 35.8  RDW  --  13.4 13.3  PLT  --  190 196    Cardiac Enzymes Recent Labs Lab 07/17/16 0805 07/17/16 1047  TROPONINI 0.07* 0.10*   No results for input(s): TROPIPOC in the last 168 hours.   BNPNo results for input(s): BNP, PROBNP in the last 168 hours.   DDimer No results for input(s): DDIMER in the last 168 hours.   Radiology    Dg Abd 1 View  Result Date: 07/17/2016 CLINICAL DATA:  Cysto with right ureteral stent placement EXAM: ABDOMEN - 1 VIEW FLUOROSCOPY TIME:  1 minutes 19 seconds 18.59 mGy COMPARISON:  CT abdomen/ pelvis dated  07/17/2016 FINDINGS: Contrast opacifies a duplicated/partially duplicated right renal collecting system with a stent in the dilated lower pole moiety. IMPRESSION: Duplicated right renal collecting system with a stent in the lower pole moiety, as above. Electronically Signed   By: Julian Hy M.D.   On: 07/17/2016 23:37   Dg Chest Port 1 View  Result Date: 07/17/2016 CLINICAL DATA:  Shortness of breath and chest pain today. EXAM: PORTABLE CHEST 1 VIEW COMPARISON:  CT chest 03/26/2016.  PA and lateral chest 06/29/2016. FINDINGS: The lungs are emphysematous but clear. Heart size is normal. Aortic atherosclerosis is noted. No pneumothorax or pleural effusion. No acute bony abnormality. IMPRESSION: No acute disease. Emphysema. Atherosclerosis. Electronically Signed   By: Inge Rise M.D.   On: 07/17/2016 08:33   Dg C-arm 1-60 Min-no Report  Result Date: 07/17/2016 Fluoroscopy was utilized by the requesting physician.  No radiographic interpretation.   Ct Renal Stone Study  Result Date: 07/17/2016 CLINICAL DATA:  Right lower quadrant pain today. Patient status post retrograde urethrogram for renal stones 07/13/2016. EXAM: CT ABDOMEN AND PELVIS WITHOUT CONTRAST TECHNIQUE: Multidetector CT imaging of the abdomen and pelvis was performed following  the standard protocol without IV contrast. COMPARISON:  Image from the urethra g 07/13/2016. CT abdomen and pelvis 06/07/2016. FINDINGS: Lower chest: Heart size is upper normal. Mild dependent atelectasis noted. No pleural or pericardial effusion. Hepatobiliary: No focal liver abnormality is seen. No gallstones, gallbladder wall thickening, or biliary dilatation. Pancreas: A few calcifications are seen in the pancreas consistent chronic pancreatitis. 0.7 cm cystic lesion in the head of the pancreas is unchanged and may be due to prior inflammation. Minimal prominence of the pancreatic duct is noted, stable. Spleen: Normal in size without focal abnormality. Adrenals/Urinary Tract: The adrenal glands appear normal. There is new extensive stranding about the right kidney and ureter and moderate to moderately severe right hydronephrosis and dilatation of the right ureter due to a 0.4 cm in the distal right ureter. The stone is 4-5 cm proximal to the UVJ. The stone was in the proximal right ureter on the most recent CT. No other urinary tract stones are identified. The left kidney and urinary bladder appear normal. Stomach/Bowel: Stomach is within normal limits. Appendix appears normal. No evidence of bowel wall thickening, distention, or inflammatory changes. Vascular/Lymphatic: Aortic atherosclerosis. No enlarged abdominal or pelvic lymph nodes. Reproductive: Prostate is normal in size with some calcifications present. Other: Small volume of free pelvic fluid is identified. Musculoskeletal: No acute or focal abnormality. IMPRESSION: Moderate to moderately severe right hydronephrosis and extensive stranding about the right kidney and ureter are new since the most recent CT scan. Obstructing 0.4 cm stone is seen in the distal right ureter approximately 4-5 cm proximal to the UVJ. Atherosclerosis. Chronic calcific pancreatitis. Low attenuating lesion the pancreas is described on report of prior exam is unchanged.  Electronically Signed   By: Inge Rise M.D.   On: 07/17/2016 09:46    Cardiac Studies    Patient Profile       Assessment & Plan    1  Atrial fib  Rates improved  Pt sensed he was going fast yesterday  In pain WIll switch to PO today (home meds)  Go to sl lower dose of b blicker and follow BP  Regarding coumadin  Urol to decide when this should be resumed  Pt gets followed by Dr Virgina Jock for INR checks  He will need to set outpt f/u    2  HTN  BP is up  Will resume outpt meds      Signed, Dorris Carnes, MD  07/18/2016, 7:32 AM

## 2016-07-18 NOTE — Progress Notes (Signed)
1 Day Post-Op   Assessment/Plan: -POD#1 right ureteroscopy, laser lithotripsy, stone removal and stent - I left a string on the stent and went over instructions to remove. He can be d/c from GU pt of view once medically stable. I've sent message to office for f/u.  -repeat BMP pending   Subjective: Patient reports feeling much better. Pain resolved. Minimal stent pain.  Objective: Vital signs in last 24 hours: Temp:  [97.7 F (36.5 C)-98.8 F (37.1 C)] 98.3 F (36.8 C) (06/10 0800) Pulse Rate:  [27-120] 79 (06/10 1100) Resp:  [12-29] 18 (06/10 1100) BP: (119-172)/(85-151) 157/99 (06/10 1100) SpO2:  [90 %-100 %] 96 % (06/10 1100) Weight:  [74.3 kg (163 lb 12.8 oz)-77.5 kg (170 lb 13.7 oz)] 77.5 kg (170 lb 13.7 oz) (06/10 0500)  Intake/Output from previous day: 06/09 0701 - 06/10 0700 In: 4363.5 [P.O.:474; I.V.:3839.5; IV Piggyback:50] Out: 2297 [LGXQJ:1941] Intake/Output this shift: Total I/O In: 500 [I.V.:500] Out: 250 [Urine:250]  Physical Exam:  NAD Alert and oriented x 3 I showed him the stone   Lab Results:  Recent Labs  07/17/16 0805  HGB 18.8*  HCT 52.5*   BMET  Recent Labs  07/17/16 0805  NA 134*  K 4.3  CL 99*  CO2 22  GLUCOSE 134*  BUN 25*  CREATININE 1.76*  CALCIUM 9.3    Recent Labs  07/17/16 0805  INR 1.53   No results for input(s): LABURIN in the last 72 hours. Results for orders placed or performed during the hospital encounter of 07/17/16  Urine Culture     Status: Abnormal   Collection Time: 07/17/16  9:50 AM  Result Value Ref Range Status   Specimen Description URINE, RANDOM  Final   Special Requests NONE  Final   Culture <10,000 COLONIES/mL INSIGNIFICANT GROWTH (A)  Final   Report Status 07/18/2016 FINAL  Final  MRSA PCR Screening     Status: None   Collection Time: 07/17/16  4:19 PM  Result Value Ref Range Status   MRSA by PCR NEGATIVE NEGATIVE Final    Comment:        The GeneXpert MRSA Assay (FDA approved for NASAL  specimens only), is one component of a comprehensive MRSA colonization surveillance program. It is not intended to diagnose MRSA infection nor to guide or monitor treatment for MRSA infections.     Studies/Results: Dg Abd 1 View  Result Date: 07/17/2016 CLINICAL DATA:  Cysto with right ureteral stent placement EXAM: ABDOMEN - 1 VIEW FLUOROSCOPY TIME:  1 minutes 19 seconds 18.59 mGy COMPARISON:  CT abdomen/ pelvis dated 07/17/2016 FINDINGS: Contrast opacifies a duplicated/partially duplicated right renal collecting system with a stent in the dilated lower pole moiety. IMPRESSION: Duplicated right renal collecting system with a stent in the lower pole moiety, as above. Electronically Signed   By: Julian Hy M.D.   On: 07/17/2016 23:37   Dg Chest Port 1 View  Result Date: 07/17/2016 CLINICAL DATA:  Shortness of breath and chest pain today. EXAM: PORTABLE CHEST 1 VIEW COMPARISON:  CT chest 03/26/2016.  PA and lateral chest 06/29/2016. FINDINGS: The lungs are emphysematous but clear. Heart size is normal. Aortic atherosclerosis is noted. No pneumothorax or pleural effusion. No acute bony abnormality. IMPRESSION: No acute disease. Emphysema. Atherosclerosis. Electronically Signed   By: Inge Rise M.D.   On: 07/17/2016 08:33   Dg C-arm 1-60 Min-no Report  Result Date: 07/17/2016 Fluoroscopy was utilized by the requesting physician.  No radiographic interpretation.   Ct  Renal Stone Study  Result Date: 07/17/2016 CLINICAL DATA:  Right lower quadrant pain today. Patient status post retrograde urethrogram for renal stones 07/13/2016. EXAM: CT ABDOMEN AND PELVIS WITHOUT CONTRAST TECHNIQUE: Multidetector CT imaging of the abdomen and pelvis was performed following the standard protocol without IV contrast. COMPARISON:  Image from the urethra g 07/13/2016. CT abdomen and pelvis 06/07/2016. FINDINGS: Lower chest: Heart size is upper normal. Mild dependent atelectasis noted. No pleural or  pericardial effusion. Hepatobiliary: No focal liver abnormality is seen. No gallstones, gallbladder wall thickening, or biliary dilatation. Pancreas: A few calcifications are seen in the pancreas consistent chronic pancreatitis. 0.7 cm cystic lesion in the head of the pancreas is unchanged and may be due to prior inflammation. Minimal prominence of the pancreatic duct is noted, stable. Spleen: Normal in size without focal abnormality. Adrenals/Urinary Tract: The adrenal glands appear normal. There is new extensive stranding about the right kidney and ureter and moderate to moderately severe right hydronephrosis and dilatation of the right ureter due to a 0.4 cm in the distal right ureter. The stone is 4-5 cm proximal to the UVJ. The stone was in the proximal right ureter on the most recent CT. No other urinary tract stones are identified. The left kidney and urinary bladder appear normal. Stomach/Bowel: Stomach is within normal limits. Appendix appears normal. No evidence of bowel wall thickening, distention, or inflammatory changes. Vascular/Lymphatic: Aortic atherosclerosis. No enlarged abdominal or pelvic lymph nodes. Reproductive: Prostate is normal in size with some calcifications present. Other: Small volume of free pelvic fluid is identified. Musculoskeletal: No acute or focal abnormality. IMPRESSION: Moderate to moderately severe right hydronephrosis and extensive stranding about the right kidney and ureter are new since the most recent CT scan. Obstructing 0.4 cm stone is seen in the distal right ureter approximately 4-5 cm proximal to the UVJ. Atherosclerosis. Chronic calcific pancreatitis. Low attenuating lesion the pancreas is described on report of prior exam is unchanged. Electronically Signed   By: Inge Rise M.D.   On: 07/17/2016 09:46       LOS: 1 day   Eh Sauseda 07/18/2016, 12:25 PM

## 2016-07-18 NOTE — Progress Notes (Signed)
Orders written for patient to be discharged home. Discharge AVS reviewed with patient. Patient states understanding of discharge instructions without any questions or concerns. Patient aware of follow up appointments, new medications & changes to existing medications. Patient left room with all of personal belongings at discharge.

## 2016-07-19 ENCOUNTER — Encounter (HOSPITAL_COMMUNITY): Payer: Self-pay | Admitting: Urology

## 2016-07-19 DIAGNOSIS — N3942 Incontinence without sensory awareness: Secondary | ICD-10-CM | POA: Diagnosis not present

## 2016-07-21 DIAGNOSIS — N200 Calculus of kidney: Secondary | ICD-10-CM | POA: Diagnosis not present

## 2016-07-21 DIAGNOSIS — N201 Calculus of ureter: Secondary | ICD-10-CM | POA: Diagnosis not present

## 2016-07-22 LAB — CULTURE, BLOOD (ROUTINE X 2)
CULTURE: NO GROWTH
CULTURE: NO GROWTH
SPECIAL REQUESTS: ADEQUATE
SPECIAL REQUESTS: ADEQUATE

## 2016-07-23 DIAGNOSIS — I10 Essential (primary) hypertension: Secondary | ICD-10-CM | POA: Diagnosis not present

## 2016-07-23 DIAGNOSIS — Z7901 Long term (current) use of anticoagulants: Secondary | ICD-10-CM | POA: Diagnosis not present

## 2016-08-02 ENCOUNTER — Other Ambulatory Visit: Payer: Self-pay | Admitting: Cardiology

## 2016-08-02 NOTE — Telephone Encounter (Signed)
Rx(s) sent to pharmacy electronically.  

## 2016-08-03 DIAGNOSIS — L718 Other rosacea: Secondary | ICD-10-CM | POA: Diagnosis not present

## 2016-08-03 DIAGNOSIS — L905 Scar conditions and fibrosis of skin: Secondary | ICD-10-CM | POA: Diagnosis not present

## 2016-08-05 DIAGNOSIS — H2513 Age-related nuclear cataract, bilateral: Secondary | ICD-10-CM | POA: Diagnosis not present

## 2016-08-05 DIAGNOSIS — H10413 Chronic giant papillary conjunctivitis, bilateral: Secondary | ICD-10-CM | POA: Diagnosis not present

## 2016-08-05 DIAGNOSIS — H348312 Tributary (branch) retinal vein occlusion, right eye, stable: Secondary | ICD-10-CM | POA: Diagnosis not present

## 2016-08-16 DIAGNOSIS — I480 Paroxysmal atrial fibrillation: Secondary | ICD-10-CM | POA: Diagnosis not present

## 2016-08-16 DIAGNOSIS — Z6825 Body mass index (BMI) 25.0-25.9, adult: Secondary | ICD-10-CM | POA: Diagnosis not present

## 2016-08-16 DIAGNOSIS — Z7901 Long term (current) use of anticoagulants: Secondary | ICD-10-CM | POA: Diagnosis not present

## 2016-08-16 DIAGNOSIS — J449 Chronic obstructive pulmonary disease, unspecified: Secondary | ICD-10-CM | POA: Diagnosis not present

## 2016-08-16 DIAGNOSIS — R310 Gross hematuria: Secondary | ICD-10-CM | POA: Diagnosis not present

## 2016-08-16 DIAGNOSIS — I10 Essential (primary) hypertension: Secondary | ICD-10-CM | POA: Diagnosis not present

## 2016-08-16 DIAGNOSIS — N200 Calculus of kidney: Secondary | ICD-10-CM | POA: Diagnosis not present

## 2016-09-16 DIAGNOSIS — Z7901 Long term (current) use of anticoagulants: Secondary | ICD-10-CM | POA: Diagnosis not present

## 2016-09-16 DIAGNOSIS — I480 Paroxysmal atrial fibrillation: Secondary | ICD-10-CM | POA: Diagnosis not present

## 2016-10-13 DIAGNOSIS — I480 Paroxysmal atrial fibrillation: Secondary | ICD-10-CM | POA: Diagnosis not present

## 2016-10-13 DIAGNOSIS — Z7901 Long term (current) use of anticoagulants: Secondary | ICD-10-CM | POA: Diagnosis not present

## 2016-11-01 DIAGNOSIS — I480 Paroxysmal atrial fibrillation: Secondary | ICD-10-CM | POA: Diagnosis not present

## 2016-11-01 DIAGNOSIS — Z7901 Long term (current) use of anticoagulants: Secondary | ICD-10-CM | POA: Diagnosis not present

## 2017-02-24 ENCOUNTER — Ambulatory Visit: Payer: Medicare Other | Admitting: Family

## 2017-02-24 ENCOUNTER — Inpatient Hospital Stay (HOSPITAL_COMMUNITY): Admission: RE | Admit: 2017-02-24 | Payer: Self-pay | Source: Ambulatory Visit

## 2019-01-18 ENCOUNTER — Emergency Department (HOSPITAL_COMMUNITY): Payer: Medicare Other

## 2019-01-18 ENCOUNTER — Inpatient Hospital Stay (HOSPITAL_COMMUNITY): Payer: Medicare Other

## 2019-01-18 ENCOUNTER — Inpatient Hospital Stay (HOSPITAL_COMMUNITY)
Admission: EM | Admit: 2019-01-18 | Discharge: 2019-03-03 | DRG: 004 | Disposition: A | Discharge status: Skilled Nursing Facility | Payer: Medicare Other | Attending: Internal Medicine | Admitting: Internal Medicine

## 2019-01-18 ENCOUNTER — Other Ambulatory Visit: Payer: Self-pay

## 2019-01-18 ENCOUNTER — Encounter (HOSPITAL_COMMUNITY): Payer: Self-pay | Admitting: Neurology

## 2019-01-18 DIAGNOSIS — I82B12 Acute embolism and thrombosis of left subclavian vein: Secondary | ICD-10-CM | POA: Diagnosis present

## 2019-01-18 DIAGNOSIS — I482 Chronic atrial fibrillation, unspecified: Secondary | ICD-10-CM | POA: Diagnosis present

## 2019-01-18 DIAGNOSIS — F17200 Nicotine dependence, unspecified, uncomplicated: Secondary | ICD-10-CM | POA: Diagnosis present

## 2019-01-18 DIAGNOSIS — B370 Candidal stomatitis: Secondary | ICD-10-CM | POA: Diagnosis not present

## 2019-01-18 DIAGNOSIS — M6281 Muscle weakness (generalized): Secondary | ICD-10-CM | POA: Diagnosis not present

## 2019-01-18 DIAGNOSIS — G8191 Hemiplegia, unspecified affecting right dominant side: Secondary | ICD-10-CM | POA: Diagnosis present

## 2019-01-18 DIAGNOSIS — R000 Tachycardia, unspecified: Secondary | ICD-10-CM

## 2019-01-18 DIAGNOSIS — I4821 Permanent atrial fibrillation: Secondary | ICD-10-CM | POA: Diagnosis not present

## 2019-01-18 DIAGNOSIS — R451 Restlessness and agitation: Secondary | ICD-10-CM | POA: Diagnosis not present

## 2019-01-18 DIAGNOSIS — R1310 Dysphagia, unspecified: Secondary | ICD-10-CM | POA: Diagnosis not present

## 2019-01-18 DIAGNOSIS — I714 Abdominal aortic aneurysm, without rupture, unspecified: Secondary | ICD-10-CM | POA: Diagnosis present

## 2019-01-18 DIAGNOSIS — T17908A Unspecified foreign body in respiratory tract, part unspecified causing other injury, initial encounter: Secondary | ICD-10-CM

## 2019-01-18 DIAGNOSIS — D72829 Elevated white blood cell count, unspecified: Secondary | ICD-10-CM

## 2019-01-18 DIAGNOSIS — Z8673 Personal history of transient ischemic attack (TIA), and cerebral infarction without residual deficits: Secondary | ICD-10-CM

## 2019-01-18 DIAGNOSIS — Z4659 Encounter for fitting and adjustment of other gastrointestinal appliance and device: Secondary | ICD-10-CM

## 2019-01-18 DIAGNOSIS — Z9289 Personal history of other medical treatment: Secondary | ICD-10-CM

## 2019-01-18 DIAGNOSIS — R509 Fever, unspecified: Secondary | ICD-10-CM

## 2019-01-18 DIAGNOSIS — I4891 Unspecified atrial fibrillation: Secondary | ICD-10-CM | POA: Diagnosis present

## 2019-01-18 DIAGNOSIS — G9340 Encephalopathy, unspecified: Secondary | ICD-10-CM | POA: Diagnosis not present

## 2019-01-18 DIAGNOSIS — Z20822 Contact with and (suspected) exposure to covid-19: Secondary | ICD-10-CM | POA: Diagnosis not present

## 2019-01-18 DIAGNOSIS — I10 Essential (primary) hypertension: Secondary | ICD-10-CM | POA: Diagnosis present

## 2019-01-18 DIAGNOSIS — Z7901 Long term (current) use of anticoagulants: Secondary | ICD-10-CM

## 2019-01-18 DIAGNOSIS — L8990 Pressure ulcer of unspecified site, unspecified stage: Secondary | ICD-10-CM | POA: Insufficient documentation

## 2019-01-18 DIAGNOSIS — R209 Unspecified disturbances of skin sensation: Secondary | ICD-10-CM | POA: Diagnosis not present

## 2019-01-18 DIAGNOSIS — I739 Peripheral vascular disease, unspecified: Secondary | ICD-10-CM | POA: Diagnosis not present

## 2019-01-18 DIAGNOSIS — F17210 Nicotine dependence, cigarettes, uncomplicated: Secondary | ICD-10-CM | POA: Diagnosis present

## 2019-01-18 DIAGNOSIS — J95851 Ventilator associated pneumonia: Secondary | ICD-10-CM | POA: Diagnosis not present

## 2019-01-18 DIAGNOSIS — R4702 Dysphasia: Secondary | ICD-10-CM | POA: Diagnosis present

## 2019-01-18 DIAGNOSIS — I712 Thoracic aortic aneurysm, without rupture: Secondary | ICD-10-CM | POA: Diagnosis present

## 2019-01-18 DIAGNOSIS — I69391 Dysphagia following cerebral infarction: Secondary | ICD-10-CM | POA: Diagnosis not present

## 2019-01-18 DIAGNOSIS — J9620 Acute and chronic respiratory failure, unspecified whether with hypoxia or hypercapnia: Secondary | ICD-10-CM | POA: Diagnosis not present

## 2019-01-18 DIAGNOSIS — G9341 Metabolic encephalopathy: Secondary | ICD-10-CM | POA: Diagnosis not present

## 2019-01-18 DIAGNOSIS — D75839 Thrombocytosis, unspecified: Secondary | ICD-10-CM

## 2019-01-18 DIAGNOSIS — T501X5A Adverse effect of loop [high-ceiling] diuretics, initial encounter: Secondary | ICD-10-CM | POA: Diagnosis present

## 2019-01-18 DIAGNOSIS — F1920 Other psychoactive substance dependence, uncomplicated: Secondary | ICD-10-CM | POA: Diagnosis not present

## 2019-01-18 DIAGNOSIS — J440 Chronic obstructive pulmonary disease with acute lower respiratory infection: Secondary | ICD-10-CM | POA: Diagnosis present

## 2019-01-18 DIAGNOSIS — E876 Hypokalemia: Secondary | ICD-10-CM | POA: Diagnosis not present

## 2019-01-18 DIAGNOSIS — Y848 Other medical procedures as the cause of abnormal reaction of the patient, or of later complication, without mention of misadventure at the time of the procedure: Secondary | ICD-10-CM | POA: Diagnosis not present

## 2019-01-18 DIAGNOSIS — I998 Other disorder of circulatory system: Secondary | ICD-10-CM | POA: Diagnosis present

## 2019-01-18 DIAGNOSIS — R918 Other nonspecific abnormal finding of lung field: Secondary | ICD-10-CM | POA: Diagnosis not present

## 2019-01-18 DIAGNOSIS — J449 Chronic obstructive pulmonary disease, unspecified: Secondary | ICD-10-CM | POA: Diagnosis not present

## 2019-01-18 DIAGNOSIS — I110 Hypertensive heart disease with heart failure: Secondary | ICD-10-CM | POA: Diagnosis present

## 2019-01-18 DIAGNOSIS — Z79899 Other long term (current) drug therapy: Secondary | ICD-10-CM

## 2019-01-18 DIAGNOSIS — Z515 Encounter for palliative care: Secondary | ICD-10-CM | POA: Diagnosis not present

## 2019-01-18 DIAGNOSIS — R4701 Aphasia: Secondary | ICD-10-CM | POA: Diagnosis present

## 2019-01-18 DIAGNOSIS — I161 Hypertensive emergency: Secondary | ICD-10-CM | POA: Diagnosis present

## 2019-01-18 DIAGNOSIS — J9621 Acute and chronic respiratory failure with hypoxia: Secondary | ICD-10-CM | POA: Diagnosis not present

## 2019-01-18 DIAGNOSIS — J988 Other specified respiratory disorders: Secondary | ICD-10-CM | POA: Diagnosis not present

## 2019-01-18 DIAGNOSIS — I5032 Chronic diastolic (congestive) heart failure: Secondary | ICD-10-CM | POA: Diagnosis present

## 2019-01-18 DIAGNOSIS — E1152 Type 2 diabetes mellitus with diabetic peripheral angiopathy with gangrene: Secondary | ICD-10-CM | POA: Diagnosis present

## 2019-01-18 DIAGNOSIS — E870 Hyperosmolality and hypernatremia: Secondary | ICD-10-CM | POA: Diagnosis not present

## 2019-01-18 DIAGNOSIS — D473 Essential (hemorrhagic) thrombocythemia: Secondary | ICD-10-CM

## 2019-01-18 DIAGNOSIS — J9601 Acute respiratory failure with hypoxia: Secondary | ICD-10-CM | POA: Diagnosis not present

## 2019-01-18 DIAGNOSIS — Z885 Allergy status to narcotic agent status: Secondary | ICD-10-CM

## 2019-01-18 DIAGNOSIS — Z431 Encounter for attention to gastrostomy: Secondary | ICD-10-CM

## 2019-01-18 DIAGNOSIS — I952 Hypotension due to drugs: Secondary | ICD-10-CM | POA: Diagnosis not present

## 2019-01-18 DIAGNOSIS — Z430 Encounter for attention to tracheostomy: Secondary | ICD-10-CM | POA: Diagnosis not present

## 2019-01-18 DIAGNOSIS — J154 Pneumonia due to other streptococci: Secondary | ICD-10-CM | POA: Diagnosis present

## 2019-01-18 DIAGNOSIS — I63139 Cerebral infarction due to embolism of unspecified carotid artery: Secondary | ICD-10-CM | POA: Diagnosis not present

## 2019-01-18 DIAGNOSIS — I959 Hypotension, unspecified: Secondary | ICD-10-CM | POA: Diagnosis present

## 2019-01-18 DIAGNOSIS — R414 Neurologic neglect syndrome: Secondary | ICD-10-CM | POA: Diagnosis present

## 2019-01-18 DIAGNOSIS — Z4682 Encounter for fitting and adjustment of non-vascular catheter: Secondary | ICD-10-CM | POA: Diagnosis not present

## 2019-01-18 DIAGNOSIS — M2550 Pain in unspecified joint: Secondary | ICD-10-CM | POA: Diagnosis not present

## 2019-01-18 DIAGNOSIS — Z7189 Other specified counseling: Secondary | ICD-10-CM | POA: Diagnosis not present

## 2019-01-18 DIAGNOSIS — J9600 Acute respiratory failure, unspecified whether with hypoxia or hypercapnia: Secondary | ICD-10-CM | POA: Diagnosis not present

## 2019-01-18 DIAGNOSIS — Z930 Tracheostomy status: Secondary | ICD-10-CM | POA: Diagnosis not present

## 2019-01-18 DIAGNOSIS — Z9114 Patient's other noncompliance with medication regimen: Secondary | ICD-10-CM

## 2019-01-18 DIAGNOSIS — H53461 Homonymous bilateral field defects, right side: Secondary | ICD-10-CM | POA: Diagnosis present

## 2019-01-18 DIAGNOSIS — F1010 Alcohol abuse, uncomplicated: Secondary | ICD-10-CM | POA: Diagnosis present

## 2019-01-18 DIAGNOSIS — J690 Pneumonitis due to inhalation of food and vomit: Secondary | ICD-10-CM | POA: Diagnosis not present

## 2019-01-18 DIAGNOSIS — L89152 Pressure ulcer of sacral region, stage 2: Secondary | ICD-10-CM | POA: Diagnosis present

## 2019-01-18 DIAGNOSIS — I82622 Acute embolism and thrombosis of deep veins of left upper extremity: Secondary | ICD-10-CM | POA: Diagnosis present

## 2019-01-18 DIAGNOSIS — I639 Cerebral infarction, unspecified: Secondary | ICD-10-CM

## 2019-01-18 DIAGNOSIS — I6300 Cerebral infarction due to thrombosis of unspecified precerebral artery: Secondary | ICD-10-CM | POA: Diagnosis not present

## 2019-01-18 DIAGNOSIS — I63412 Cerebral infarction due to embolism of left middle cerebral artery: Secondary | ICD-10-CM | POA: Diagnosis not present

## 2019-01-18 DIAGNOSIS — I480 Paroxysmal atrial fibrillation: Secondary | ICD-10-CM | POA: Diagnosis not present

## 2019-01-18 DIAGNOSIS — N401 Enlarged prostate with lower urinary tract symptoms: Secondary | ICD-10-CM | POA: Diagnosis not present

## 2019-01-18 DIAGNOSIS — E119 Type 2 diabetes mellitus without complications: Secondary | ICD-10-CM | POA: Diagnosis not present

## 2019-01-18 DIAGNOSIS — N390 Urinary tract infection, site not specified: Secondary | ICD-10-CM | POA: Diagnosis not present

## 2019-01-18 DIAGNOSIS — R1312 Dysphagia, oropharyngeal phase: Secondary | ICD-10-CM | POA: Diagnosis present

## 2019-01-18 DIAGNOSIS — Z7401 Bed confinement status: Secondary | ICD-10-CM | POA: Diagnosis not present

## 2019-01-18 DIAGNOSIS — E538 Deficiency of other specified B group vitamins: Secondary | ICD-10-CM | POA: Diagnosis not present

## 2019-01-18 DIAGNOSIS — M1990 Unspecified osteoarthritis, unspecified site: Secondary | ICD-10-CM | POA: Diagnosis not present

## 2019-01-18 DIAGNOSIS — G459 Transient cerebral ischemic attack, unspecified: Secondary | ICD-10-CM | POA: Diagnosis not present

## 2019-01-18 DIAGNOSIS — G92 Toxic encephalopathy: Secondary | ICD-10-CM | POA: Diagnosis present

## 2019-01-18 DIAGNOSIS — B9689 Other specified bacterial agents as the cause of diseases classified elsewhere: Secondary | ICD-10-CM | POA: Diagnosis present

## 2019-01-18 DIAGNOSIS — R402411 Glasgow coma scale score 13-15, in the field [EMT or ambulance]: Secondary | ICD-10-CM | POA: Diagnosis not present

## 2019-01-18 DIAGNOSIS — R29810 Facial weakness: Secondary | ICD-10-CM | POA: Diagnosis present

## 2019-01-18 DIAGNOSIS — M79642 Pain in left hand: Secondary | ICD-10-CM | POA: Diagnosis not present

## 2019-01-18 DIAGNOSIS — E7800 Pure hypercholesterolemia, unspecified: Secondary | ICD-10-CM | POA: Diagnosis not present

## 2019-01-18 DIAGNOSIS — I6310 Cerebral infarction due to embolism of unspecified precerebral artery: Secondary | ICD-10-CM | POA: Diagnosis not present

## 2019-01-18 DIAGNOSIS — R569 Unspecified convulsions: Secondary | ICD-10-CM | POA: Diagnosis not present

## 2019-01-18 DIAGNOSIS — Z978 Presence of other specified devices: Secondary | ICD-10-CM

## 2019-01-18 DIAGNOSIS — T17908D Unspecified foreign body in respiratory tract, part unspecified causing other injury, subsequent encounter: Secondary | ICD-10-CM | POA: Diagnosis not present

## 2019-01-18 DIAGNOSIS — I63119 Cerebral infarction due to embolism of unspecified vertebral artery: Secondary | ICD-10-CM | POA: Diagnosis not present

## 2019-01-18 DIAGNOSIS — R2681 Unsteadiness on feet: Secondary | ICD-10-CM | POA: Diagnosis not present

## 2019-01-18 DIAGNOSIS — I340 Nonrheumatic mitral (valve) insufficiency: Secondary | ICD-10-CM | POA: Diagnosis not present

## 2019-01-18 DIAGNOSIS — R2689 Other abnormalities of gait and mobility: Secondary | ICD-10-CM | POA: Diagnosis not present

## 2019-01-18 DIAGNOSIS — Z452 Encounter for adjustment and management of vascular access device: Secondary | ICD-10-CM | POA: Diagnosis not present

## 2019-01-18 DIAGNOSIS — N400 Enlarged prostate without lower urinary tract symptoms: Secondary | ICD-10-CM | POA: Diagnosis present

## 2019-01-18 DIAGNOSIS — I4819 Other persistent atrial fibrillation: Secondary | ICD-10-CM | POA: Diagnosis not present

## 2019-01-18 DIAGNOSIS — E785 Hyperlipidemia, unspecified: Secondary | ICD-10-CM | POA: Diagnosis present

## 2019-01-18 DIAGNOSIS — Z95828 Presence of other vascular implants and grafts: Secondary | ICD-10-CM | POA: Diagnosis not present

## 2019-01-18 DIAGNOSIS — J9611 Chronic respiratory failure with hypoxia: Secondary | ICD-10-CM | POA: Diagnosis not present

## 2019-01-18 DIAGNOSIS — R0682 Tachypnea, not elsewhere classified: Secondary | ICD-10-CM | POA: Diagnosis not present

## 2019-01-18 DIAGNOSIS — G8929 Other chronic pain: Secondary | ICD-10-CM | POA: Diagnosis not present

## 2019-01-18 DIAGNOSIS — E871 Hypo-osmolality and hyponatremia: Secondary | ICD-10-CM | POA: Diagnosis not present

## 2019-01-18 DIAGNOSIS — E43 Unspecified severe protein-calorie malnutrition: Secondary | ICD-10-CM | POA: Diagnosis not present

## 2019-01-18 DIAGNOSIS — R410 Disorientation, unspecified: Secondary | ICD-10-CM | POA: Diagnosis not present

## 2019-01-18 DIAGNOSIS — D509 Iron deficiency anemia, unspecified: Secondary | ICD-10-CM | POA: Diagnosis present

## 2019-01-18 DIAGNOSIS — R278 Other lack of coordination: Secondary | ICD-10-CM | POA: Diagnosis not present

## 2019-01-18 DIAGNOSIS — D539 Nutritional anemia, unspecified: Secondary | ICD-10-CM | POA: Diagnosis present

## 2019-01-18 DIAGNOSIS — I69320 Aphasia following cerebral infarction: Secondary | ICD-10-CM | POA: Diagnosis not present

## 2019-01-18 DIAGNOSIS — Z931 Gastrostomy status: Secondary | ICD-10-CM | POA: Diagnosis not present

## 2019-01-18 DIAGNOSIS — I6930 Unspecified sequelae of cerebral infarction: Secondary | ICD-10-CM | POA: Diagnosis not present

## 2019-01-18 DIAGNOSIS — I9589 Other hypotension: Secondary | ICD-10-CM | POA: Diagnosis not present

## 2019-01-18 DIAGNOSIS — F329 Major depressive disorder, single episode, unspecified: Secondary | ICD-10-CM | POA: Diagnosis not present

## 2019-01-18 DIAGNOSIS — D508 Other iron deficiency anemias: Secondary | ICD-10-CM | POA: Diagnosis not present

## 2019-01-18 DIAGNOSIS — K219 Gastro-esophageal reflux disease without esophagitis: Secondary | ICD-10-CM | POA: Diagnosis present

## 2019-01-18 DIAGNOSIS — Z781 Physical restraint status: Secondary | ICD-10-CM

## 2019-01-18 DIAGNOSIS — C946 Myelodysplastic disease, not classified: Secondary | ICD-10-CM | POA: Diagnosis not present

## 2019-01-18 LAB — PROTIME-INR
INR: 1.1 (ref 0.8–1.2)
Prothrombin Time: 14 seconds (ref 11.4–15.2)

## 2019-01-18 LAB — CBG MONITORING, ED
Glucose-Capillary: 97 mg/dL (ref 70–99)
Glucose-Capillary: 151 mg/dL — ABNORMAL HIGH (ref 70–99)

## 2019-01-18 LAB — URINALYSIS, ROUTINE W REFLEX MICROSCOPIC
Bacteria, UA: NONE SEEN
Bilirubin Urine: NEGATIVE
Glucose, UA: 50 mg/dL — AB
Ketones, ur: NEGATIVE mg/dL
Leukocytes,Ua: NEGATIVE
Nitrite: NEGATIVE
Protein, ur: NEGATIVE mg/dL
Specific Gravity, Urine: 1.024 (ref 1.005–1.030)
pH: 6 (ref 5.0–8.0)

## 2019-01-18 LAB — CBC
HCT: 43.7 % (ref 39.0–52.0)
Hemoglobin: 15.1 g/dL (ref 13.0–17.0)
MCH: 35.7 pg — ABNORMAL HIGH (ref 26.0–34.0)
MCHC: 34.6 g/dL (ref 30.0–36.0)
MCV: 103.3 fL — ABNORMAL HIGH (ref 80.0–100.0)
Platelets: 244 10*3/uL (ref 150–400)
RBC: 4.23 MIL/uL (ref 4.22–5.81)
RDW: 14.6 % (ref 11.5–15.5)
WBC: 11.1 10*3/uL — ABNORMAL HIGH (ref 4.0–10.5)
nRBC: 0 % (ref 0.0–0.2)

## 2019-01-18 LAB — I-STAT CHEM 8, ED
BUN: 13 mg/dL (ref 8–23)
Calcium, Ion: 1.11 mmol/L — ABNORMAL LOW (ref 1.15–1.40)
Chloride: 104 mmol/L (ref 98–111)
Creatinine, Ser: 0.8 mg/dL (ref 0.61–1.24)
Glucose, Bld: 106 mg/dL — ABNORMAL HIGH (ref 70–99)
HCT: 45 % (ref 39.0–52.0)
Hemoglobin: 15.3 g/dL (ref 13.0–17.0)
Potassium: 3.4 mmol/L — ABNORMAL LOW (ref 3.5–5.1)
Sodium: 139 mmol/L (ref 135–145)
TCO2: 27 mmol/L (ref 22–32)

## 2019-01-18 LAB — DIFFERENTIAL
Abs Immature Granulocytes: 0.05 10*3/uL (ref 0.00–0.07)
Basophils Absolute: 0 10*3/uL (ref 0.0–0.1)
Basophils Relative: 0 %
Eosinophils Absolute: 0.1 10*3/uL (ref 0.0–0.5)
Eosinophils Relative: 1 %
Immature Granulocytes: 1 %
Lymphocytes Relative: 33 %
Lymphs Abs: 3.6 10*3/uL (ref 0.7–4.0)
Monocytes Absolute: 0.6 10*3/uL (ref 0.1–1.0)
Monocytes Relative: 5 %
Neutro Abs: 6.8 10*3/uL (ref 1.7–7.7)
Neutrophils Relative %: 60 %

## 2019-01-18 LAB — COMPREHENSIVE METABOLIC PANEL
ALT: 23 U/L (ref 0–44)
AST: 22 U/L (ref 15–41)
Albumin: 3.7 g/dL (ref 3.5–5.0)
Alkaline Phosphatase: 61 U/L (ref 38–126)
Anion gap: 11 (ref 5–15)
BUN: 11 mg/dL (ref 8–23)
CO2: 22 mmol/L (ref 22–32)
Calcium: 9.5 mg/dL (ref 8.9–10.3)
Chloride: 106 mmol/L (ref 98–111)
Creatinine, Ser: 0.94 mg/dL (ref 0.61–1.24)
GFR calc Af Amer: >60 mL/min (ref >60)
GFR calc non Af Amer: >60 mL/min (ref >60)
Glucose, Bld: 107 mg/dL — ABNORMAL HIGH (ref 70–99)
Potassium: 3.3 mmol/L — ABNORMAL LOW (ref 3.5–5.1)
Sodium: 139 mmol/L (ref 135–145)
Total Bilirubin: 1.7 mg/dL — ABNORMAL HIGH (ref 0.3–1.2)
Total Protein: 6.5 g/dL (ref 6.5–8.1)

## 2019-01-18 LAB — LACTIC ACID, PLASMA: Lactic Acid, Venous: 2.8 mmol/L (ref 0.5–1.9)

## 2019-01-18 LAB — APTT: aPTT: 29 seconds (ref 24–36)

## 2019-01-18 LAB — HIV ANTIBODY (ROUTINE TESTING W REFLEX): HIV Screen 4th Generation wRfx: NONREACTIVE

## 2019-01-18 MED ORDER — NICARDIPINE HCL IN NACL 20-0.86 MG/200ML-% IV SOLN
0.0000 mg/h | INTRAVENOUS | Status: DC | PRN
  Filled 2019-01-18: qty 200

## 2019-01-18 MED ORDER — STROKE: EARLY STAGES OF RECOVERY BOOK: Freq: Once | Status: DC

## 2019-01-18 MED ORDER — ACETAMINOPHEN 325 MG PO TABS
650.0000 mg | ORAL_TABLET | ORAL | Status: DC | PRN
  Administered 2019-01-26 – 2019-03-01 (×6): 650 mg via ORAL
  Filled 2019-01-18 (×6): qty 2

## 2019-01-18 MED ORDER — ALTEPLASE (STROKE) FULL DOSE INFUSION
0.9000 mg/kg | Freq: Once | INTRAVENOUS | Status: AC
  Administered 2019-01-18: 72 mg via INTRAVENOUS
  Filled 2019-01-18: qty 100

## 2019-01-18 MED ORDER — LABETALOL HCL 5 MG/ML IV SOLN
10.0000 mg | Freq: Once | INTRAVENOUS | Status: AC | PRN
  Administered 2019-01-18: 10 mg via INTRAVENOUS

## 2019-01-18 MED ORDER — ACETAMINOPHEN 650 MG RE SUPP: 650.0000 mg | RECTAL | Status: DC | PRN

## 2019-01-18 MED ORDER — SODIUM CHLORIDE 0.9 % IV SOLN
50.0000 mL/h | INTRAVENOUS | Status: DC
  Administered 2019-01-19 – 2019-01-29 (×10): 50 mL/h via INTRAVENOUS

## 2019-01-18 MED ORDER — LABETALOL HCL 5 MG/ML IV SOLN
INTRAVENOUS | Status: AC
  Administered 2019-01-18: 10 mg
  Filled 2019-01-18: qty 4

## 2019-01-18 MED ORDER — CLEVIDIPINE BUTYRATE 0.5 MG/ML IV EMUL: 0.0000 mg/h | INTRAVENOUS | Status: DC

## 2019-01-18 MED ORDER — SODIUM CHLORIDE 0.9% FLUSH
3.0000 mL | Freq: Once | INTRAVENOUS | Status: AC
  Administered 2019-01-18: 3 mL via INTRAVENOUS

## 2019-01-18 MED ORDER — SODIUM CHLORIDE 0.9 % IV SOLN
50.0000 mL | Freq: Once | INTRAVENOUS | Status: AC
  Administered 2019-01-18: 50 mL via INTRAVENOUS

## 2019-01-18 MED ORDER — ACETAMINOPHEN 160 MG/5ML PO SOLN
650.0000 mg | ORAL | Status: DC | PRN
  Administered 2019-01-21 – 2019-02-25 (×12): 650 mg
  Filled 2019-01-18 (×12): qty 20.3

## 2019-01-18 MED ORDER — CLEVIDIPINE BUTYRATE 0.5 MG/ML IV EMUL
INTRAVENOUS | Status: AC
  Administered 2019-01-18: 23:00:00 8 mg/h via INTRAVENOUS
  Filled 2019-01-18: qty 50

## 2019-01-18 MED ORDER — PANTOPRAZOLE SODIUM 40 MG IV SOLR
40.0000 mg | Freq: Every day | INTRAVENOUS | Status: DC
  Administered 2019-01-19 (×2): 40 mg via INTRAVENOUS
  Filled 2019-01-18 (×2): qty 40

## 2019-01-18 MED ORDER — IOHEXOL 350 MG/ML SOLN
100.0000 mL | Freq: Once | INTRAVENOUS | Status: AC | PRN
  Administered 2019-01-18: 100 mL via INTRAVENOUS

## 2019-01-18 MED ORDER — CLEVIDIPINE BUTYRATE 0.5 MG/ML IV EMUL
INTRAVENOUS | Status: AC
  Administered 2019-01-18: 2 mg
  Filled 2019-01-18: qty 50

## 2019-01-18 NOTE — Progress Notes (Signed)
PHARMACIST CODE STROKE RESPONSE  Notified to mix tPA at 1943 by Dr. Leonel Ramsay Delivered tPA to RN at 1946  tPA dose = 7.2mg  bolus over 1 minute followed by 64.8mg  for a total dose of 72mg  over 1 hour  Issues/delays encountered (if applicable): Awaiting INR result   Duanne Limerick PharmD. BCPS  01/18/19 7:51 PM

## 2019-01-18 NOTE — ED Triage Notes (Signed)
Per [facility] pt collapsed while on the phone with a friend, non verbal, Right side facial droop and Right arm drift   18G LFA

## 2019-01-18 NOTE — ED Provider Notes (Signed)
I have personally seen and examined the patient. I have reviewed the documentation on PMH/FH/Soc Hx. I have discussed the plan of care with the resident and patient.  I have reviewed and agree with the resident's documentation. Please see associated encounter note.  Briefly, the patient is a 68 y.o. male here with code stroke.  New onset of right facial droop, right arm and leg weakness.  Left-sided preference.  Last known normal within 4 hours.  Had sudden symptoms while talking to a friend.  Initially hypotensive that quickly resolved with IV fluids.  Unknown of his medical history.  Patient with negative head CT.  No large vessel occlusion.  Patient was given TPA by neurology, Dr. Leonel Ramsay.  Will be admitted to the neurological ICU.  Blood pressure stabilized.  Lab work showed no significant anemia, electrolyte abnormality.  After TPA on reevaluation patient appears to be moving right-sided extremities better.  Admitted to the neurological ICU in stable condition.  .Critical Care Performed by: Lennice Sites, DO Authorized by: Lennice Sites, DO   Critical care provider statement:    Critical care time (minutes):  35   Critical care was necessary to treat or prevent imminent or life-threatening deterioration of the following conditions:  CNS failure or compromise   Critical care was time spent personally by me on the following activities:  Blood draw for specimens, development of treatment plan with patient or surrogate, discussions with primary provider, evaluation of patient's response to treatment, examination of patient, obtaining history from patient or surrogate, ordering and review of laboratory studies, ordering and performing treatments and interventions, ordering and review of radiographic studies, pulse oximetry, re-evaluation of patient's condition and review of old charts   EKG shows atrial fibrillation at a rate of 133.  No ischemic changes.   Lennice Sites, DO 01/18/19  2022

## 2019-01-18 NOTE — H&P (Signed)
Neurology H&P  CC: Right-sided weakness  History is obtained from: Roommate  HPI: [name] is a 68 y.o. male with a history of atrial fibrillation who typically takes Coumadin, however was presumably unable to obtain this while in the [place].  He just returned from the [place] on December 1 and got his Coumadin filled on December 8.   Tonight, he was in his normal state of health at 6:30 PM, and then had an abrupt change leaning to the right and becoming aphasic.  EMS was called and code stroke was activated en route.   LKW: [id number] tpa given?:  Yes Modified Rankin Scale: 0-Completely asymptomatic and back to baseline post- stroke   ROS: A complete ROS was performed and is negative except as noted in the HPI.   Past Medical History:  Diagnosis Date  . AAA (abdominal aortic aneurysm) ([place])    per last CT 03-26-2016---  3.9cm  . Anticoagulated on Coumadin   . Arthritis of spine   . Benign localized prostatic hyperplasia with lower urinary tract symptoms (LUTS)   . Chronic atrial fibrillation ([place]) cardiolgoist -- dr Stanford Breed   dx 2009  . Complication of anesthesia    per pt today (07-08-2016) had problem after having anesthesia 12/ 2016 surgery at [facility] "not good" went to ED ( in epic ED visit 01/ 2017 dx severe episode of major depression disorder and admitted to [facility]  . COPD with emphysema ([place])   . Dyspnea on exertion   . Full dentures   . GERD (gastroesophageal reflux disease)   . Hematuria   . History of amaurosis fugax    right eye 02-08-2015 -- resolved /  documented in epic possible left eye amaurosis fugax 11/ 2008 (pt denies) / per last MRI show previous bilateral occipital lobe infarcts  . History of concussion    as child  . History of CVA (cerebrovascular accident) 10/ 2016  and per pt Dec 2017 in [place]   neurologist-  dr Leonie Man-- per note silent infarcts on MRI-- multiple acute infarcts bilateral cerebullm, right temporal lobe, and bilateral  occipital lobes due to embolic phenomena (atrial fib.)  . History of seizures as a child   . Hyperlipidemia   . Hypertension   . Left arm numbness    occasional numbess post residual axilla arterial occlusion and surgery  . Major depressive disorder    hx severe episode without psychosis 02/2015 w/ homicidal ideation  . Narcotic dependence ([place])   . PAD (peripheral artery disease) ([place])    followed by dr fields-- s/p  left axilla to branchial and left axilla to ulnar bypass graft due to ischemic hand  . Pulmonary nodule    per CT 03-26-2016  right lower lobe  . PVD (peripheral vascular disease) ([place])   . Right ureteral stone   . Senile purpura ([place])   . Weakness of left arm    residual from axilla arterial occlusion and post surgery  . Wears glasses      Family History  Problem Relation Age of Onset  . Hypertension Mother   . Lung cancer Mother   . Stomach cancer Father        died age 11  . Cancer Sister   . Early death Neg Hx   . Heart disease Neg Hx   . Hyperlipidemia Neg Hx   . Kidney disease Neg Hx   . Stroke Neg Hx      Social History:  reports that he has been  smoking cigarettes. He has a 102.00 pack-year smoking history. He has never used smokeless tobacco. He reports current alcohol use. He reports that he does not use drugs.   Prior to Admission medications   Medication Sig Start Date End Date Taking? Authorizing Provider  acetaminophen (TYLENOL) 500 MG tablet Take 500 mg by mouth every 6 (six) hours as needed for mild pain.     [provider]  ALPRAZolam Duanne Moron) 0.5 MG tablet Take 0.5 mg by mouth at bedtime as needed for anxiety or sleep.  05/29/15   [provider]  atorvastatin (LIPITOR) 40 MG tablet Take 1 tablet (40 mg total) by mouth daily. For high cholesterol 02/12/15   Nwoko, Herbert Pun I, NP  calcium carbonate (TUMS - DOSED IN MG ELEMENTAL CALCIUM) 500 MG chewable tablet Chew 1 tablet by mouth 2 (two) times daily with a meal.     [provider]  ciprofloxacin (CIPRO) 500 MG tablet Take 1 tablet (500 mg total) by mouth 2 (two) times daily. 07/18/16   Theodis Blaze, MD  diltiazem (TIAZAC) 240 MG 24 hr capsule TAKE ONE (1) CAPSULE BY MOUTH EACH DAY Patient taking differently: TAKE ONE (1) CAPSULE BY MOUTH EACH DAY--- takes in am 11/14/15   Lelon Perla, MD  furosemide (LASIX) 20 MG tablet Take 20 mg by mouth daily.     [provider]  gabapentin (NEURONTIN) 100 MG capsule Take 1 capsule (100 mg total) by mouth at bedtime. For agitation 02/12/15   Lindell Spar I, NP  metoprolol tartrate (LOPRESSOR) 100 MG tablet Take 1 tablet (100 mg total) by mouth 2 (two) times daily. <PLEASE MAKE APPOINTMENT FOR REFILLS> 08/02/16   Lelon Perla, MD  metoprolol tartrate (LOPRESSOR) 50 MG tablet Take 1 tablet (50 mg total) by mouth 2 (two) times daily. 07/18/16   Theodis Blaze, MD  traMADol (ULTRAM) 50 MG tablet Take 50-100 mg by mouth every 6 (six) hours as needed for moderate pain.  12/16/15   [provider]  warfarin (COUMADIN) 5 MG tablet Take 1 tablet (5 mg total) by mouth daily at 6 PM. Take as directed.  Dose may vary pending INR: For blood clot prevention 02/12/15   Lindell Spar I, NP     Exam: Current vital signs: BP (!) 154/102   Pulse (!) 126   Resp (!) 31   Wt 80 kg   SpO2 90%   BMI 26.82 kg/m    Physical Exam  Constitutional: Appears well-developed and well-nourished.  Psych: Affect appropriate to situation Eyes: No scleral injection HENT: No OP obstrucion Head: Normocephalic.  Cardiovascular: Normal rate and regular rhythm.  Respiratory: Effort normal and breath sounds normal to anterior ascultation, he is slightly tachypneic GI: Soft.  No distension. There is no tenderness.  Skin: WDI  Neuro: Mental Status: Patient is awake, alert, densely aphasic, mute without following commands Cranial Nerves: II: Right hemianopia.  Pupils are slightly unequal, L 62m, R 88mm.   III,IV, VI: Left gaze  preference V: VII: He has a right facial droop Motor: On arrival, he had a  flaccid right hemiparesis, though he did have 1/5 strength to noxious stimulation Sensory: He responds less on the right than the left Cerebellar: Does not perform   I have reviewed labs in epic and the pertinent results are: Hypokalemia at 3.3 Leukocytosis at 11.1  I have reviewed the images obtained:CT/CTA - distal branch occlusions intracranially.   Primary Diagnosis:  Cerebral infarction due to embolism of  left middle cerebral artery.   Secondary Diagnosis: Hypertension Emergency (SBP > 180 or DBP > 120 & end organ damage), Chronic atrial fibrillation and Hypokalemia   Impression: 68 year old male with history of atrial fibrillation who is not on anticoagulation for quite some time who presents with what is likely an embolus that broke up due to subtherapeutic INR.  He has asymmetric pulses, but we get a good look at the arch on his CT angiogram so we can rule out suspicion of aortic dissection. I suspect some PAD in the left arm. He was agitated and repeatedly tensing and resisting when his blood pressure cuff would inflate.  Therefore, his diastolic blood pressures taken were repeatedly inaccurate.  When his Cleviprex was aggressively titrated up, his exam began to worsen and therefore I decided to titrate back on the Cleviprex while still aggressively targeting systolic blood pressures.  He did have some improvement after the TPA.  Plan: - HgbA1c, fasting lipid panel - MRI of the brain without contrast - Frequent neuro checks - Echocardiogram - Prophylactic therapy-Antiplatelet med: Aspirin - dose 325mg  PO or 300mg  PR - Risk factor modification - Telemetry monitoring - PT consult, OT consult, Speech consult - Stroke team to follow    This patient is critically ill and at significant risk of neurological worsening, death and care requires constant monitoring of vital signs,  hemodynamics,respiratory and cardiac monitoring, neurological assessment, discussion with family, other specialists and medical decision making of high complexity. I spent 50 minutes of neurocritical care time  in the care of  this patient. This was time spent independent of any time provided by nurse practitioner or PA.  Roland Rack, MD [facility] [phone]  If 7pm- 7am, please page neurology on call as listed in [place].

## 2019-01-18 NOTE — ED Notes (Signed)
TPA restarted per Leonel Ramsay

## 2019-01-18 NOTE — Consult Note (Signed)
NAME:  [name], MRN:  [id number], DOB:  1950/11/27, LOS: 0 ADMISSION DATE:  01/18/2019, CONSULTATION DATE:  01/18/19 REFERRING MD:  Leonel Ramsay, CHIEF COMPLAINT:  Unable to elicit   Brief History   66yM with hypertensive emergency in setting of acute L MCA stroke s/p tPA, AF/RVR   History of present illness   [name] is [phone] with chart history of atrial fibrillation on warfarin, COPD, benzodiazepine use on whom we were consulted for management of hypertensive emergency, AF/RVR, and increased work of breathing. History is obtained through chart review as the patient is aphasic and I am unable to get in touch with the friend who alerted EMS this evening, [name] (phone number listed below). He had just returned from a year long trip to the [place] 12/1 and had a lapse in filling his warfarin until 12/8. Was in [place] till 1830 on night of admission when he was observed to be leaning to his right and had difficulty with speech. Code stroke activated, tPA administered with improvement in his R hemiparesis and speech.   Past Medical History  AF COPD CVA PAD  Significant Hospital Events   12/10 tPA administered at 1946  Consults:  PCCM  Procedures:  none  Significant Diagnostic Tests:  CTA h/n: distal MCA branches that lose opacification and there could be a few small emboli in the left MCA territory  Micro Data:  none  Antimicrobials:  none  Interim history/subjective:  n/a  Objective   Blood pressure (!) 160/126, pulse 92, resp. rate 16, weight 80 kg, SpO2 100 %.        Intake/Output Summary (Last 24 hours) at 01/18/2019 2330 Last data filed at 01/18/2019 2250 Gross per 24 hour  Intake 147.91 ml  Output --  Net 147.91 ml   Filed Weights   01/18/19 1900  Weight: 80 kg    Examination: HENT: NCAT, dry MM Neck: no LAD, trachea midline, no JVD Lungs: No wheeze. Rhonchorous L>R, periodically with increased work of breathing as he tries to sit  upright in bed and pulls against restraints Cardiovascular: tachycardic, IRIR, no murmur Abdomen: soft, nontender, normal bowel sounds Extremities: warm, well-perfused without cyanosis, edema Neuro: PERRL, awake, alert not following commands, R facial droop, R hemiparesis  Resolved Hospital Problem list   n/a  Assessment & Plan:   # Acute stroke with distal branch occlusions of L MCA: - neuro admitting: A1c, lipids, MRI, neuro checks, ASA, PT, OT, speech - echo with bubble - aspiration precautions  # Hypertensive emergency:  - cleviprex for goal <180/105 given tpa   # Atrial fibrillation with RVR: - check tsh, free t4 - on diltiazem and metoprolol chronically at home  - dilt gtt for HR <110 while he's unable to take PO meds - heparin gtt at 24h post-tpa  # Chronic benzodiazepine use: - CIWA   # Hypoxia, tachypnea: Mild. may be result of aspiration pneumonitis vs agitation.  - check vbg, covid-19   Best practice:  Diet: NPO Pain/Anxiety/Delirium protocol (if indicated): ciwa  VAP protocol (if indicated):  DVT prophylaxis: SCDs GI prophylaxis: not indicated Glucose control: q6 checks Mobility: Bed-level Code Status: Full - to be confirmed Family Communication: Marcy Siren [phone] attempted to contact but unable to leave message Disposition: Neuro ICU  Labs   CBC: Recent Labs  Lab 01/18/19 1923 01/18/19 1928  WBC 11.1*  --   NEUTROABS 6.8  --   HGB 15.1 15.3  HCT 43.7 45.0  MCV 103.3*  --  PLT 244  --     Basic Metabolic Panel: Recent Labs  Lab 01/18/19 1923 01/18/19 1928  NA 139 139  K 3.3* 3.4*  CL 106 104  CO2 22  --   GLUCOSE 107* 106*  BUN 11 13  CREATININE 0.94 0.80  CALCIUM 9.5  --    GFR: CrCl cannot be calculated (Unknown ideal weight.). Recent Labs  Lab 01/18/19 1923 01/18/19 1951  WBC 11.1*  --   LATICACIDVEN  --  2.8*    Liver Function Tests: Recent Labs  Lab 01/18/19 1923  AST 22  ALT 23  ALKPHOS 61  BILITOT  1.7*  PROT 6.5  ALBUMIN 3.7   No results for input(s): LIPASE, AMYLASE in the last 168 hours. No results for input(s): AMMONIA in the last 168 hours.  ABG    Component Value Date/Time   TCO2 27 01/18/2019 1928     Coagulation Profile: Recent Labs  Lab 01/18/19 1923  INR 1.1    Cardiac Enzymes: No results for input(s): CKTOTAL, CKMB, CKMBINDEX, TROPONINI in the last 168 hours.  HbA1C: Hgb A1c MFr Bld  Date/Time Value Ref Range Status  11/25/2014 01:34 PM 5.4 4.8 - 5.6 % Final    Comment:    (NOTE)         Pre-diabetes: 5.7 - 6.4         Diabetes: >6.4         Glycemic control for adults with diabetes: <7.0   05/26/2007 02:00 PM   Final   5.5 (NOTE)   The ADA recommends the following therapeutic goals for glycemic   control related to Hgb A1C measurement:   Goal of Therapy:   < 7.0% Hgb A1C   Action Suggested:  > 8.0% Hgb A1C   Ref:  Diabetes Care, 22, Suppl. 1, 1999    CBG: Recent Labs  Lab 01/18/19 1924 01/18/19 2242  GLUCAP 97 151*    Review of Systems:   unable to obtain  Past Medical History  He,  has a past medical history of AAA (abdominal aortic aneurysm) ([place]), Anticoagulated on Coumadin, Arthritis of spine, Benign localized prostatic hyperplasia with lower urinary tract symptoms (LUTS), Chronic atrial fibrillation ([place]) (cardiolgoist -- dr Stanford Breed), Complication of anesthesia, COPD with emphysema ([place]), Dyspnea on exertion, Full dentures, GERD (gastroesophageal reflux disease), Hematuria, History of amaurosis fugax, History of concussion, History of CVA (cerebrovascular accident) (10/ 2016  and per pt Dec 2017 in [place]), History of seizures as a child, Hyperlipidemia, Hypertension, Left arm numbness, Major depressive disorder, Narcotic dependence ([place]), PAD (peripheral artery disease) ([place]), Pulmonary nodule, PVD (peripheral vascular disease) ([place]), Right ureteral stone, Senile purpura ([place]), Weakness of left arm, and Wears glasses.   Surgical  History    Past Surgical History:  Procedure Laterality Date  . ARCH AORTOGRAM N/A 04/05/2014   Procedure: ARCH AORTOGRAM, FIRST ORDER CATHETERIZATION LEFT SUBCLAVIAN ARTERY;  Surgeon: Elam Dutch, MD;  [facility] OR;  Service: Vascular;  Laterality: N/A;  . AXILLARY-FEMORAL BYPASS GRAFT Left 04/08/2014   Procedure: LEFT AXILLARY ARTERY TO BRACHIAL ARTERY BYPASS USING NON REVERSE LEFT GREATER SAPHENOUS VEIN ,LIGATION OF LEFT AXILLARY ARTERY ANEURYSM;  Surgeon: Elam Dutch, MD;  Location: [facility];  Service: Vascular;  Laterality: Left;  . BYPASS AXILLA/BRACHIAL ARTERY Left 01/27/2015   Procedure: LEFT BRACHIAL-ULAR ARTERY BYPASS USING GREATER SAPHENOUS VEIN;  Surgeon: Elam Dutch, MD;  Location: [facility];  Service: Vascular;  Laterality: Left;  . CYSTOSCOPY WITH RETROGRADE PYELOGRAM, URETEROSCOPY AND  STENT PLACEMENT Right 07/17/2016   Procedure: CYSTOSCOPY WITH RETROGRADE PYELOGRAM, URETEROSCOPY AND STENT PLACEMENT,LASER;  Surgeon: Festus Aloe, MD;  Location: [facility] ORS;  Service: Urology;  Laterality: Right;  . CYSTOSCOPY/URETEROSCOPY/HOLMIUM LASER/STENT PLACEMENT Right 07/13/2016   Procedure: CYSTOSCOPY, RIGHT URETEROSCOPY RETROGRADE PYELOGRAM;  Surgeon: Kathie Rhodes, MD;  [facility];  Service: Urology;  Laterality: Right;  . EMBOLECTOMY Left 04/05/2014   Procedure: THROMBO ENDARTERECTOMY OF LEFT BRACHIAL, RADIAL AND ULNAR ARTERY,  Left Radial artery cut down and radial artery thrombectomy.;  Surgeon: Elam Dutch, MD;  Location: [facility] OR;  Service: Vascular;  Laterality: Left;  . facial cyst Right    cyst removal.   . PATCH ANGIOPLASTY Left 04/05/2014   Procedure: LEFT ARM VEIN PATCH ANGIOPLASTY;  Surgeon: Elam Dutch, MD;  Location: [facility];  Service: Vascular;  Laterality: Left;  . PERIPHERAL VASCULAR CATHETERIZATION N/A 01/22/2015   Procedure: Aortic Arch Angiography;  Surgeon: Elam Dutch, MD;  Location: [facility] CV LAB;  Service:  Cardiovascular;  Laterality: N/A;  . THROMBECTOMY BRACHIAL ARTERY Left 11/20/2014   Procedure: 1.  Thrombectomy Left Axilo-Brachial Bypass  2.  Thromboendarterecotmy of Left Brachial Artery with Fogarty Thrombectomy of Radial and Ulnar Arteries with Dacron patch angioplasty Left Brachial Artery. 3. Intraoperative  Arteriogram times four.;  Surgeon: Mal Misty, MD;  Location: [facility];  Service: Vascular;  Laterality: Left;  . TRANSTHORACIC ECHOCARDIOGRAM  11/25/2014   moderate concentric LVH, ef 60-65%, unable to evaluation LVDF due to atrial fib/  mild MR/  severe LAE  . VEIN HARVEST Left 04/08/2014   Procedure: LEFT GREATER SAPHENOUS VEIN HARVEST;  Surgeon: Elam Dutch, MD;  Location: [facility];  Service: Vascular;  Laterality: Left;  Marland Kitchen VEIN HARVEST Right 01/27/2015   Procedure: RIGHT GREATER SAPHENOUS VEIN HARVEST;  Surgeon: Elam Dutch, MD;  Location: [facility];  Service: Vascular;  Laterality: Right;     Social History   reports that he has been smoking cigarettes. He has a 102.00 pack-year smoking history. He has never used smokeless tobacco. He reports current alcohol use. He reports that he does not use drugs.   Family History   His family history includes Cancer in his sister; Hypertension in his mother; Lung cancer in his mother; Stomach cancer in his father. There is no history of Early death, Heart disease, Hyperlipidemia, Kidney disease, or Stroke.   Allergies Allergies  Allergen Reactions  . Oxycodone Itching     Home Medications  Prior to Admission medications   Medication Sig Start Date End Date Taking? Authorizing Provider  acetaminophen (TYLENOL) 500 MG tablet Take 500 mg by mouth every 6 (six) hours as needed for mild pain.     [provider]  ALPRAZolam Duanne Moron) 0.5 MG tablet Take 0.5 mg by mouth at bedtime as needed for anxiety or sleep.  05/29/15   [provider]  atorvastatin (LIPITOR) 40 MG tablet Take 1 tablet (40 mg total) by mouth daily. For  high cholesterol 02/12/15   Nwoko, Herbert Pun I, NP  calcium carbonate (TUMS - DOSED IN MG ELEMENTAL CALCIUM) 500 MG chewable tablet Chew 1 tablet by mouth 2 (two) times daily with a meal.     [provider]  ciprofloxacin (CIPRO) 500 MG tablet Take 1 tablet (500 mg total) by mouth 2 (two) times daily. 07/18/16   Theodis Blaze, MD  diltiazem (TIAZAC) 240 MG 24 hr capsule TAKE ONE (1) [place] DAY Patient taking differently: TAKE ONE (  1) CAPSULE BY MOUTH EACH DAY--- takes in am 11/14/15   Lelon Perla, MD  furosemide (LASIX) 20 MG tablet Take 20 mg by mouth daily.     [provider]  gabapentin (NEURONTIN) 100 MG capsule Take 1 capsule (100 mg total) by mouth at bedtime. For agitation 02/12/15   Lindell Spar I, NP  metoprolol tartrate (LOPRESSOR) 100 MG tablet Take 1 tablet (100 mg total) by mouth 2 (two) times daily. <PLEASE MAKE APPOINTMENT FOR REFILLS> 08/02/16   Lelon Perla, MD  metoprolol tartrate (LOPRESSOR) 50 MG tablet Take 1 tablet (50 mg total) by mouth 2 (two) times daily. 07/18/16   Theodis Blaze, MD  traMADol (ULTRAM) 50 MG tablet Take 50-100 mg by mouth every 6 (six) hours as needed for moderate pain.  12/16/15   [provider]  warfarin (COUMADIN) 5 MG tablet Take 1 tablet (5 mg total) by mouth daily at 6 PM. Take as directed.  Dose may vary pending INR: For blood clot prevention 02/12/15   Lindell Spar I, NP     Critical care time: 30    This patient is critically ill with stroke s/p tPA course complicated by hypertension requiring continuous IV antihypertensive medication; which, requires frequent high complexity decision making, assessment, support, evaluation, and titration of therapies. This was completed through the application of advanced monitoring technologies and extensive interpretation of multiple databases. During this encounter critical care time was devoted to patient care services described in this note for 30 minutes.  Walker Shadow PGY-5, Pulmonary/Critical Care

## 2019-01-18 NOTE — ED Provider Notes (Signed)
[facility] EMERGENCY DEPARTMENT Provider Note   CSN: [id number] Arrival date & time: 01/18/19  1918  An emergency department physician performed an initial assessment on this suspected stroke patient at 64.  History Chief Complaint  Patient presents with  . Code Stroke    [name] is a 68 y.o. male.  HPI Pt is a 68 year old male with PMH of afib (warfarin), AAA, COPD, hypertension, hyperlipidemia who presents to the ED as a code stroke.  Per report, patient was in his normal state of health around 1830 he had an abrupt change when he started to lean to the right and was unable to talk to his friend at home.  Per EMS, concern for right-sided facial droop as well as right arm drift and aphasia.  Patient is nonverbal on arrival and unable to contribute to further history.  Past Medical History:  Diagnosis Date  . AAA (abdominal aortic aneurysm) ([place])    per last CT 03-26-2016---  3.9cm  . Anticoagulated on Coumadin   . Arthritis of spine   . Benign localized prostatic hyperplasia with lower urinary tract symptoms (LUTS)   . Chronic atrial fibrillation ([place]) cardiolgoist -- dr Stanford Breed   dx 2009  . Complication of anesthesia    per pt today (07-08-2016) had problem after having anesthesia 12/ 2016 surgery at [facility] "not good" went to ED ( in epic ED visit 01/ 2017 dx severe episode of major depression disorder and admitted to [facility]  . COPD with emphysema ([place])   . Dyspnea on exertion   . Full dentures   . GERD (gastroesophageal reflux disease)   . Hematuria   . History of amaurosis fugax    right eye 02-08-2015 -- resolved /  documented in epic possible left eye amaurosis fugax 11/ 2008 (pt denies) / per last MRI show previous bilateral occipital lobe infarcts  . History of concussion    as child  . History of CVA (cerebrovascular accident) 10/ 2016  and per pt Dec 2017 in [place]   neurologist-  dr Leonie Man-- per note silent infarcts on MRI-- multiple  acute infarcts bilateral cerebullm, right temporal lobe, and bilateral occipital lobes due to embolic phenomena (atrial fib.)  . History of seizures as a child   . Hyperlipidemia   . Hypertension   . Left arm numbness    occasional numbess post residual axilla arterial occlusion and surgery  . Major depressive disorder    hx severe episode without psychosis 02/2015 w/ homicidal ideation  . Narcotic dependence ([place])   . PAD (peripheral artery disease) ([place])    followed by dr fields-- s/p  left axilla to branchial and left axilla to ulnar bypass graft due to ischemic hand  . Pulmonary nodule    per CT 03-26-2016  right lower lobe  . PVD (peripheral vascular disease) ([place])   . Right ureteral stone   . Senile purpura ([place])   . Weakness of left arm    residual from axilla arterial occlusion and post surgery  . Wears glasses     Patient Active Problem List   Diagnosis Date Noted  . Stroke (cerebrum) ([place]) 01/18/2019  . Hydronephrosis 07/17/2016  . Kidney stone 07/17/2016  . Acute kidney injury ([place]) 07/17/2016  . Elevated troponin 07/17/2016  . Adjustment disorder with disturbance of emotion 07/14/2016  . Hyperlipemia 07/07/2016  . Arterial occlusion due to stenosis ([place]) 08/21/2015  . Aftercare following surgery of the circulatory system 08/21/2015  . Current  smoker 08/21/2015  . Abdominal aortic aneurysm (AAA) ([place]) 05/02/2015  . MDD (major depressive disorder), single episode, severe , no psychosis ([place]) 02/10/2015  . Major depressive disorder, recurrent, severe without psychotic features ([place]) 02/09/2015  . Limb ischemia 01/23/2015  . Numbness 01/20/2015  . PAD (peripheral artery disease) ([place]) 12/10/2014  . Embolic stroke ([place]) [id number]  . Ischemia of extremity 11/20/2014  . Ischemia of hand 04/05/2014  . Special screening for malignant neoplasms, colon 03/20/2014  . Warfarin-induced coagulopathy ([place]) 03/20/2014  . Encounter for therapeutic drug monitoring 03/08/2013  .  Headache 01/29/2013  . Prostate nodule with urinary obstruction 12/13/2012  . Routine general medical examination at a health care facility 12/13/2012  . Depression with anxiety 12/07/2012  . Long term (current) use of anticoagulants 04/30/2010  . HYPERCHOLESTEROLEMIA-PURE 04/22/2008  . TOBACCO ABUSE 04/22/2008  . HYPERTENSION, BENIGN ESSENTIAL 04/22/2008  . Atrial fibrillation with RVR ([place]) 04/22/2008  . COPD (chronic obstructive pulmonary disease) ([place]) 04/22/2008    Past Surgical History:  Procedure Laterality Date  . ARCH AORTOGRAM N/A 04/05/2014   Procedure: ARCH AORTOGRAM, FIRST ORDER CATHETERIZATION LEFT SUBCLAVIAN ARTERY;  Surgeon: Elam Dutch, MD;  Location: [facility] OR;  Service: Vascular;  Laterality: N/A;  . AXILLARY-FEMORAL BYPASS GRAFT Left 04/08/2014   Procedure: LEFT AXILLARY ARTERY TO BRACHIAL ARTERY BYPASS USING NON REVERSE LEFT GREATER SAPHENOUS VEIN ,LIGATION OF LEFT AXILLARY ARTERY ANEURYSM;  Surgeon: Elam Dutch, MD;  Location: [facility];  Service: Vascular;  Laterality: Left;  . BYPASS AXILLA/BRACHIAL ARTERY Left 01/27/2015   Procedure: LEFT BRACHIAL-ULAR ARTERY BYPASS USING GREATER SAPHENOUS VEIN;  Surgeon: Elam Dutch, MD;  Location: [facility];  Service: Vascular;  Laterality: Left;  . CYSTOSCOPY WITH RETROGRADE PYELOGRAM, URETEROSCOPY AND STENT PLACEMENT Right 07/17/2016   Procedure: CYSTOSCOPY WITH RETROGRADE PYELOGRAM, URETEROSCOPY AND STENT PLACEMENT,LASER;  Surgeon: Festus Aloe, MD;  Location: [facility];  Service: Urology;  Laterality: Right;  . CYSTOSCOPY/URETEROSCOPY/HOLMIUM LASER/STENT PLACEMENT Right 07/13/2016   Procedure: CYSTOSCOPY, RIGHT URETEROSCOPY RETROGRADE PYELOGRAM;  Surgeon: Kathie Rhodes, MD;  Location: [facility];  Service: Urology;  Laterality: Right;  . EMBOLECTOMY Left 04/05/2014   Procedure: THROMBO ENDARTERECTOMY OF LEFT BRACHIAL, RADIAL AND ULNAR ARTERY,  Left Radial artery cut down and radial artery thrombectomy.;  Surgeon:  Elam Dutch, MD;  Location: [facility] OR;  Service: Vascular;  Laterality: Left;  . facial cyst Right    cyst removal.   . PATCH ANGIOPLASTY Left 04/05/2014   Procedure: LEFT ARM VEIN PATCH ANGIOPLASTY;  Surgeon: Elam Dutch, MD;  Location: [facility];  Service: Vascular;  Laterality: Left;  . PERIPHERAL VASCULAR CATHETERIZATION N/A 01/22/2015   Procedure: Aortic Arch Angiography;  Surgeon: Elam Dutch, MD;  Location: [facility] CV LAB;  Service: Cardiovascular;  Laterality: N/A;  . THROMBECTOMY BRACHIAL ARTERY Left 11/20/2014   Procedure: 1.  Thrombectomy Left Axilo-Brachial Bypass  2.  Thromboendarterecotmy of Left Brachial Artery with Fogarty Thrombectomy of Radial and Ulnar Arteries with Dacron patch angioplasty Left Brachial Artery. 3. Intraoperative  Arteriogram times four.;  Surgeon: Mal Misty, MD;  Location: [facility];  Service: Vascular;  Laterality: Left;  . TRANSTHORACIC ECHOCARDIOGRAM  11/25/2014   moderate concentric LVH, ef 60-65%, unable to evaluation LVDF due to atrial fib/  mild MR/  severe LAE  . VEIN HARVEST Left 04/08/2014   Procedure: LEFT GREATER SAPHENOUS VEIN HARVEST;  Surgeon: Elam Dutch, MD;  Location: [facility];  Service: Vascular;  Laterality: Left;  Marland Kitchen VEIN HARVEST Right 01/27/2015  Procedure: RIGHT GREATER SAPHENOUS VEIN HARVEST;  Surgeon: Elam Dutch, MD;  Location: [facility];  Service: Vascular;  Laterality: Right;       Family History  Problem Relation Age of Onset  . Hypertension Mother   . Lung cancer Mother   . Stomach cancer Father        died age 30  . Cancer Sister   . Early death Neg Hx   . Heart disease Neg Hx   . Hyperlipidemia Neg Hx   . Kidney disease Neg Hx   . Stroke Neg Hx     Social History   Tobacco Use  . Smoking status: Current Every Day Smoker    Packs/day: 2.00    Years: 51.00    Pack years: 102.00    Types: Cigarettes  . Smokeless tobacco: Never Used  . Tobacco comment: per pt 07-08-2016 down to 2ppd from 4 ppd  (quit one time for 4 years since started at age 82)  Substance Use Topics  . Alcohol use: Yes    Alcohol/week: 0.0 standard drinks    Comment: occasional   . Drug use: No    Comment: per pt 07-08-2016 hx drug use stopped 1970's    Home Medications Prior to Admission medications   Medication Sig Start Date End Date Taking? Authorizing Provider  acetaminophen (TYLENOL) 500 MG tablet Take 500 mg by mouth every 6 (six) hours as needed for mild pain.     [provider]  ALPRAZolam Duanne Moron) 0.5 MG tablet Take 0.5 mg by mouth at bedtime as needed for anxiety or sleep.  05/29/15   [provider]  atorvastatin (LIPITOR) 40 MG tablet Take 1 tablet (40 mg total) by mouth daily. For high cholesterol 02/12/15   Nwoko, Herbert Pun I, NP  calcium carbonate (TUMS - DOSED IN MG ELEMENTAL CALCIUM) 500 MG chewable tablet Chew 1 tablet by mouth 2 (two) times daily with a meal.     [provider]  ciprofloxacin (CIPRO) 500 MG tablet Take 1 tablet (500 mg total) by mouth 2 (two) times daily. 07/18/16   Theodis Blaze, MD  diltiazem (TIAZAC) 240 MG 24 hr capsule TAKE ONE (1) CAPSULE BY MOUTH EACH DAY Patient taking differently: TAKE ONE (1) CAPSULE BY MOUTH EACH DAY--- takes in am 11/14/15   Lelon Perla, MD  furosemide (LASIX) 20 MG tablet Take 20 mg by mouth daily.     [provider]  gabapentin (NEURONTIN) 100 MG capsule Take 1 capsule (100 mg total) by mouth at bedtime. For agitation 02/12/15   Lindell Spar I, NP  metoprolol tartrate (LOPRESSOR) 100 MG tablet Take 1 tablet (100 mg total) by mouth 2 (two) times daily. <PLEASE MAKE APPOINTMENT FOR REFILLS> 08/02/16   Lelon Perla, MD  metoprolol tartrate (LOPRESSOR) 50 MG tablet Take 1 tablet (50 mg total) by mouth 2 (two) times daily. 07/18/16   Theodis Blaze, MD  traMADol (ULTRAM) 50 MG tablet Take 50-100 mg by mouth every 6 (six) hours as needed for moderate pain.  12/16/15   [provider]  warfarin (COUMADIN) 5 MG  tablet Take 1 tablet (5 mg total) by mouth daily at 6 PM. Take as directed.  Dose may vary pending INR: For blood clot prevention 02/12/15   Encarnacion Slates, NP    Allergies    Oxycodone  Review of Systems   Review of Systems  Unable to perform ROS: Patient nonverbal  Neurological: Positive for weakness.  Physical Exam Updated Vital Signs BP (!) 150/98   Pulse (!) 136   Temp (!) 97.5 F (36.4 C)   Resp (!) 26   Wt 80 kg   SpO2 93%   BMI 26.82 kg/m   Physical Exam Vitals and nursing note reviewed.  Constitutional:      Appearance: He is well-developed.     Comments: Drowsy   HENT:     Head: Normocephalic and atraumatic.  Eyes:     Comments: Left gaze preference   Cardiovascular:     Rate and Rhythm: Tachycardia present. Rhythm irregular.     Heart sounds: No murmur.  Pulmonary:     Effort: Pulmonary effort is normal.     Breath sounds: Normal breath sounds.  Abdominal:     Palpations: Abdomen is soft. There is no mass.     Tenderness: There is no abdominal tenderness.  Musculoskeletal:        General: No tenderness or signs of injury.     Cervical back: Neck supple.  Skin:    General: Skin is warm and dry.  Neurological:     Comments: Aphasic L gaze preference  R facial droop Flaccid paralysis of RHB 5/5 strength of LHB  Psychiatric:     Comments: Appears altered and agitated      ED Results / Procedures / Treatments   Labs (all labs ordered are listed, but only abnormal results are displayed) Labs Reviewed  CBC - Abnormal; Notable for the following components:      Result Value   WBC 11.1 (*)    MCV 103.3 (*)    MCH 35.7 (*)    All other components within normal limits  COMPREHENSIVE METABOLIC PANEL - Abnormal; Notable for the following components:   Potassium 3.3 (*)    Glucose, Bld 107 (*)    Total Bilirubin 1.7 (*)    All other components within normal limits  LACTIC ACID, PLASMA - Abnormal; Notable for the following components:   Lactic  Acid, Venous 2.8 (*)    All other components within normal limits  URINALYSIS, ROUTINE W REFLEX MICROSCOPIC - Abnormal; Notable for the following components:   Color, Urine STRAW (*)    Glucose, UA 50 (*)    Hgb urine dipstick SMALL (*)    All other components within normal limits  I-STAT CHEM 8, ED - Abnormal; Notable for the following components:   Potassium 3.4 (*)    Glucose, Bld 106 (*)    Calcium, Ion 1.11 (*)    All other components within normal limits  CBG MONITORING, ED - Abnormal; Notable for the following components:   Glucose-Capillary 151 (*)    All other components within normal limits  CULTURE, BLOOD (ROUTINE X 2)  CULTURE, BLOOD (ROUTINE X 2)  URINE CULTURE  SARS CORONAVIRUS 2 (TAT 6-24 HRS)  PROTIME-INR  APTT  DIFFERENTIAL  HIV ANTIBODY (ROUTINE TESTING W REFLEX)  LACTIC ACID, PLASMA  CBC  HEMOGLOBIN A1C  LIPID PANEL  CBG MONITORING, ED    EKG None  Radiology CT Code Stroke CTA Head W/WO contrast  Result Date: 01/18/2019 CLINICAL DATA:  Acute left MCA occlusion syndrome. EXAM: CT ANGIOGRAPHY HEAD AND NECK TECHNIQUE: Multidetector CT imaging of the head and neck was performed using the standard protocol during bolus administration of intravenous contrast. Multiplanar CT image reconstructions and MIPs were obtained to evaluate the vascular anatomy. Carotid stenosis measurements (when applicable) are obtained utilizing NASCET criteria, using the distal internal carotid diameter as  the denominator. CONTRAST:  154mL OMNIPAQUE IOHEXOL 350 MG/ML SOLN COMPARISON:  Head CT earlier same day FINDINGS: CTA NECK FINDINGS Aortic arch: Aortic atherosclerosis. No aneurysm or dissection. Branching pattern is normal without origin stenosis. Right carotid system: Common carotid artery is widely patent to the bifurcation region. Soft and calcified plaque at the carotid bifurcation and ICA bulb but no stenosis. Cervical ICA is patent to the skull base. Left carotid system: Common  carotid artery patent to the bifurcation. Calcified and soft plaque at the carotid bifurcation and ICA bulb. No stenosis. Cervical ICA widely patent. Vertebral arteries: Dominant right vertebral artery is widely patent at the origin and through the cervical region to the foramen magnum. Left vertebral artery is occluded at the origin and reconstitutes in the distal cervical region from collaterals. Skeleton: Mild midcervical spondylosis. Other neck: No mass or lymphadenopathy. Upper chest: Emphysema. No focal or active process otherwise. CTA HEAD FINDINGS Anterior circulation: Both internal carotid arteries are patent through the siphon regions. No siphon stenosis. Anterior and middle cerebral vessels are patent without flow limiting proximal stenosis, aneurysm or vascular malformation. Both anterior cerebral arteries receive there supply from the left carotid circulation. I do not see any large or medium vessel occlusion, with particular attention to the left MCA branches. I think there may be a few M3 to M4 branches on the left that lose opacification distally and there could be a few small left MCA territory emboli. Posterior circulation: Both vertebral arteries show flow at the foramen magnum with the right being dominant. Bilateral PICA flow demonstrated. No basilar stenosis. Superior cerebellar and posterior cerebral arteries show flow. Distal PCA branches show atherosclerotic irregularity. Venous sinuses: Patent and normal. Anatomic variants: None significant otherwise. Review of the MIP images confirms the above findings IMPRESSION: Atherosclerotic disease at both carotid bifurcations but no stenosis. Prominent soft plaque at the left ICA bulb. 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 Dominant right vertebral artery widely patent. No intracranial large or medium vessel occlusion or correctable proximal stenosis, with specific attention to  the left MCA branches. I do think there are a few distal MCA branches that lose opacification and there could be a few small emboli in the left MCA territory. Vessels marked with arrows. Distal vessel atherosclerotic irregularity, particularly evident in the PCA branches. Aortic Atherosclerosis (ICD10-I70.0) and Emphysema (ICD10-J43.9). Electronically Signed   By: Nelson Chimes M.D.   On: 01/18/2019 19:59   CT Code Stroke CTA Neck W/WO contrast  Result Date: 01/18/2019 CLINICAL DATA:  Acute left MCA occlusion syndrome. EXAM: CT ANGIOGRAPHY HEAD AND NECK TECHNIQUE: Multidetector CT imaging of the head and neck was performed using the standard protocol during bolus administration of intravenous contrast. Multiplanar CT image reconstructions and MIPs were obtained to evaluate the vascular anatomy. Carotid stenosis measurements (when applicable) are obtained utilizing NASCET criteria, using the distal internal carotid diameter as the denominator. CONTRAST:  127mL OMNIPAQUE IOHEXOL 350 MG/ML SOLN COMPARISON:  Head CT earlier same day FINDINGS: CTA NECK FINDINGS Aortic arch: Aortic atherosclerosis. No aneurysm or dissection. Branching pattern is normal without origin stenosis. Right carotid system: Common carotid artery is widely patent to the bifurcation region. Soft and calcified plaque at the carotid bifurcation and ICA bulb but no stenosis. Cervical ICA is patent to the skull base. Left carotid system: Common carotid artery patent to the bifurcation. Calcified and soft plaque at the carotid bifurcation and ICA bulb. No stenosis. Cervical ICA widely  patent. Vertebral arteries: Dominant right vertebral artery is widely patent at the origin and through the cervical region to the foramen magnum. Left vertebral artery is occluded at the origin and reconstitutes in the distal cervical region from collaterals. Skeleton: Mild midcervical spondylosis. Other neck: No mass or lymphadenopathy. Upper chest: Emphysema. No focal  or active process otherwise. CTA HEAD FINDINGS Anterior circulation: Both internal carotid arteries are patent through the siphon regions. No siphon stenosis. Anterior and middle cerebral vessels are patent without flow limiting proximal stenosis, aneurysm or vascular malformation. Both anterior cerebral arteries receive there supply from the left carotid circulation. I do not see any large or medium vessel occlusion, with particular attention to the left MCA branches. I think there may be a few M3 to M4 branches on the left that lose opacification distally and there could be a few small left MCA territory emboli. Posterior circulation: Both vertebral arteries show flow at the foramen magnum with the right being dominant. Bilateral PICA flow demonstrated. No basilar stenosis. Superior cerebellar and posterior cerebral arteries show flow. Distal PCA branches show atherosclerotic irregularity. Venous sinuses: Patent and normal. Anatomic variants: None significant otherwise. Review of the MIP images confirms the above findings IMPRESSION: Atherosclerotic disease at both carotid bifurcations but no stenosis. Prominent soft plaque at the left ICA bulb. 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 Dominant right vertebral artery widely patent. No intracranial large or medium vessel occlusion or correctable proximal stenosis, with specific attention to the left MCA branches. I do think there are a few distal MCA branches that lose opacification and there could be a few small emboli in the left MCA territory. Vessels marked with arrows. Distal vessel atherosclerotic irregularity, particularly evident in the PCA branches. Aortic Atherosclerosis (ICD10-I70.0) and Emphysema (ICD10-J43.9). Electronically Signed   By: Nelson Chimes M.D.   On: 01/18/2019 19:59   DG Chest Portable 1 View  Result Date: 01/18/2019 CLINICAL DATA:  Encephalopathy EXAM: PORTABLE CHEST  1 VIEW COMPARISON:  Chest radiograph 07/17/2016 FINDINGS: There is moderate cardiomegaly. There are faint airspace opacities in the right lung base. No pleural effusion or pneumothorax. IMPRESSION: Right basilar opacities may indicate aspiration or pneumonia. Electronically Signed   By: Ulyses Jarred M.D.   On: 01/18/2019 21:36   CT HEAD CODE STROKE WO CONTRAST  Result Date: 01/18/2019 CLINICAL DATA:  Code stroke.  Right facial droop EXAM: CT HEAD WITHOUT CONTRAST TECHNIQUE: Contiguous axial images were obtained from the base of the skull through the vertex without intravenous contrast. COMPARISON:  MRI 11/23/2014 FINDINGS: Brain: Old right lateral cerebellar infarction. Old right occipital infarction. Chronic small-vessel ischemic changes of the cerebral hemispheric white matter. No sign of acute infarction, mass lesion, hemorrhage, hydrocephalus or extra-axial collection. Vascular: There is atherosclerotic calcification of the major vessels at the base of the brain. Skull: Negative Sinuses/Orbits: Clear Other: Right frontal scalp hematoma. ASPECTS [facility] Stroke Program Early CT Score) - Ganglionic level infarction (caudate, lentiform nuclei, internal capsule, insula, M1-M3 cortex): 7 - Supraganglionic infarction (M4-M6 cortex): 3 Total score (0-10 with 10 being normal): 10 IMPRESSION: 1. No acute finding by CT. Chronic small-vessel ischemic changes of the white matter. Old right cerebellar and right occipital infarctions. 2. ASPECTS is 10 3. These results were communicated to Dr. Leonel Ramsay at 7:33 pmon 12/10/2020by text page via the [facility] messaging system. Electronically Signed   By: Nelson Chimes M.D.   On: 01/18/2019 19:34    Procedures Procedures (including  critical care time)  Medications Ordered in ED Medications   stroke: mapping our early stages of recovery book (has no administration in time range)  0.9 %  sodium chloride infusion (has no administration in time range)  acetaminophen  (TYLENOL) tablet 650 mg (has no administration in time range)    Or  acetaminophen (TYLENOL) 160 MG/5ML solution 650 mg (has no administration in time range)    Or  acetaminophen (TYLENOL) suppository 650 mg (has no administration in time range)  labetalol (NORMODYNE) injection 10 mg (10 mg Intravenous Given 01/18/19 2000)    And  nicardipine (CARDENE) 20mg  in 0.86% saline 295ml IV infusion (0.1 mg/ml) (has no administration in time range)  pantoprazole (PROTONIX) injection 40 mg (has no administration in time range)  clevidipine (CLEVIPREX) infusion 0.5 mg/mL (8 mg/hr Intravenous New Bag/Given 01/18/19 2253)  diltiazem (CARDIZEM) 1 mg/mL load via infusion 10 mg (has no administration in time range)    And  diltiazem (CARDIZEM) 125 mg in dextrose 5% 125 mL (1 mg/mL) infusion (has no administration in time range)  sodium chloride flush (NS) 0.9 % injection 3 mL (3 mLs Intravenous Given 01/18/19 2244)  iohexol (OMNIPAQUE) 350 MG/ML injection 100 mL (100 mLs Intravenous Contrast Given 01/18/19 1947)  alteplase (ACTIVASE) 1 mg/mL infusion 72 mg (0 mg Intravenous Stopped 01/18/19 2100)    Followed by  0.9 %  sodium chloride infusion (0 mLs Intravenous Stopped 01/18/19 2238)  labetalol (NORMODYNE) 5 MG/ML injection (10 mg  Given 01/18/19 2025)  clevidipine (CLEVIPREX) 0.5 MG/ML infusion (  Stopped 01/18/19 2250)    ED Course  I have reviewed the triage vital signs and the nursing notes.  Pertinent labs & imaging results that were available during my care of the patient were reviewed by me and considered in my medical decision making (see chart for details).    MDM Rules/Calculators/A&P On arrival, patient is awake and responds to tactile stimuli but is aphasic.  He is currently protecting his airway.  Patient presents as a tier 1 code stroke neurology is present on arrival.  Patient was taken to CT where initial blood pressure had systolics in the [id number] and patient was given IV fluids.  There is  concern for left gaze preference and right-sided weakness.  CT head Noncon without acute findings CT head and neck obtained: Left vertebral artery occluded at origin with reconstitution distally via collaterals with no obvious occlusion/stenosis.  Radiology for syncope few small emboli of left MCA territory.  On examination, patient is aphasic, has a left gaze preference and is flaccid paralysis of his right hemibody. Patient is documented to be on warfarin.  INR is subtherapeutic and it appears patient may have not been taking the warfarin due to being out of town. In conjunction with neurology, Dr. Leonel Ramsay, patient to be initiated on TPA.  Of note, patient has had difficulty obtaining accurate blood pressure readings and appears to have pulse deficit on his left arm but has had prior history of PAD of his arm and surgery.  Aortic arch on CT imaging without evidence of dissection.  Abdomen is soft and benign.  Low suspicion for dissection at this time.  EKG: Afib with RVR; at bedside, HR improves to low 100s when patient is not agitated; will defer treatment at this time as patient is not sustaining HR >120s currently and is currently on cleviprex for BP control.  CXR: no mediastinal widening; ? R basilar opacity   Patient continues to have agitation and is  aphasic.  Has had  some improvement of his right hemibody weakness upon reassessment.  Patient to be admitted to neuro ICU.  Dr. Leonel Ramsay assumed care of patient at this time.   Final Clinical Impression(s) / ED Diagnoses Final diagnoses:  Cerebrovascular accident (CVA), unspecified mechanism [facility])    Rx / [place] Orders ED Discharge Orders    None       Burns Spain, MD 01/19/19 0002    Lennice Sites, DO 01/19/19 [id number]

## 2019-01-19 ENCOUNTER — Inpatient Hospital Stay (HOSPITAL_COMMUNITY): Payer: Medicare Other

## 2019-01-19 DIAGNOSIS — I340 Nonrheumatic mitral (valve) insufficiency: Secondary | ICD-10-CM

## 2019-01-19 DIAGNOSIS — I639 Cerebral infarction, unspecified: Secondary | ICD-10-CM

## 2019-01-19 DIAGNOSIS — I161 Hypertensive emergency: Secondary | ICD-10-CM

## 2019-01-19 LAB — HEMOGLOBIN A1C
Hgb A1c MFr Bld: 5.1 % (ref 4.8–5.6)
Mean Plasma Glucose: 99.67 mg/dL

## 2019-01-19 LAB — POCT I-STAT EG7
Bicarbonate: 22.7 mmol/L (ref 20.0–28.0)
Calcium, Ion: 1.13 mmol/L — ABNORMAL LOW (ref 1.15–1.40)
HCT: 45 % (ref 39.0–52.0)
Hemoglobin: 15.3 g/dL (ref 13.0–17.0)
O2 Saturation: 100 %
Potassium: 3.3 mmol/L — ABNORMAL LOW (ref 3.5–5.1)
Sodium: 138 mmol/L (ref 135–145)
TCO2: 24 mmol/L (ref 22–32)
pCO2, Ven: 32.1 mmHg — ABNORMAL LOW (ref 44.0–60.0)
pH, Ven: 7.457 — ABNORMAL HIGH (ref 7.250–7.430)
pO2, Ven: 174 mmHg — ABNORMAL HIGH (ref 32.0–45.0)

## 2019-01-19 LAB — POCT I-STAT 7, (LYTES, BLD GAS, ICA,H+H)
Bicarbonate: 24.7 mmol/L (ref 20.0–28.0)
Calcium, Ion: 1.22 mmol/L (ref 1.15–1.40)
HCT: 38 % — ABNORMAL LOW (ref 39.0–52.0)
Hemoglobin: 12.9 g/dL — ABNORMAL LOW (ref 13.0–17.0)
O2 Saturation: 74 %
Patient temperature: 97.5
Potassium: 3.1 mmol/L — ABNORMAL LOW (ref 3.5–5.1)
Sodium: 139 mmol/L (ref 135–145)
TCO2: 26 mmol/L (ref 22–32)
pCO2 arterial: 37.3 mmHg (ref 32.0–48.0)
pH, Arterial: 7.426 (ref 7.350–7.450)
pO2, Arterial: 37 mmHg — CL (ref 83.0–108.0)

## 2019-01-19 LAB — MAGNESIUM
Magnesium: 1.6 mg/dL — ABNORMAL LOW (ref 1.7–2.4)
Magnesium: 1.7 mg/dL (ref 1.7–2.4)

## 2019-01-19 LAB — URINE CULTURE: Culture: <10000 — AB

## 2019-01-19 LAB — CBC
HCT: 40.5 % (ref 39.0–52.0)
Hemoglobin: 14 g/dL (ref 13.0–17.0)
MCH: 35.4 pg — ABNORMAL HIGH (ref 26.0–34.0)
MCHC: 34.6 g/dL (ref 30.0–36.0)
MCV: 102.5 fL — ABNORMAL HIGH (ref 80.0–100.0)
Platelets: 238 10*3/uL (ref 150–400)
RBC: 3.95 MIL/uL — ABNORMAL LOW (ref 4.22–5.81)
RDW: 14.5 % (ref 11.5–15.5)
WBC: 11.7 10*3/uL — ABNORMAL HIGH (ref 4.0–10.5)
nRBC: 0 % (ref 0.0–0.2)

## 2019-01-19 LAB — MRSA PCR SCREENING: MRSA by PCR: NEGATIVE

## 2019-01-19 LAB — SARS CORONAVIRUS 2 (TAT 6-24 HRS): SARS Coronavirus 2: NEGATIVE

## 2019-01-19 LAB — RESPIRATORY PANEL BY RT PCR (FLU A&B, COVID)
Influenza A by PCR: NEGATIVE
Influenza B by PCR: NEGATIVE
SARS Coronavirus 2 by RT PCR: NEGATIVE

## 2019-01-19 LAB — LIPID PANEL
Cholesterol: 238 mg/dL — ABNORMAL HIGH (ref 0–200)
HDL: 43 mg/dL (ref >40)
LDL Cholesterol: 182 mg/dL — ABNORMAL HIGH (ref 0–99)
Total CHOL/HDL Ratio: 5.5 RATIO
Triglycerides: 64 mg/dL (ref <150)
VLDL: 13 mg/dL (ref 0–40)

## 2019-01-19 LAB — T4, FREE: Free T4: 0.98 ng/dL (ref 0.61–1.12)

## 2019-01-19 LAB — LACTIC ACID, PLASMA: Lactic Acid, Venous: 4.4 mmol/L (ref 0.5–1.9)

## 2019-01-19 LAB — VITAMIN B12: Vitamin B-12: 161 pg/mL — ABNORMAL LOW (ref 180–914)

## 2019-01-19 LAB — GLUCOSE, CAPILLARY
Glucose-Capillary: 106 mg/dL — ABNORMAL HIGH (ref 70–99)
Glucose-Capillary: 108 mg/dL — ABNORMAL HIGH (ref 70–99)
Glucose-Capillary: 84 mg/dL (ref 70–99)
Glucose-Capillary: 91 mg/dL (ref 70–99)
Glucose-Capillary: 95 mg/dL (ref 70–99)

## 2019-01-19 LAB — ECHOCARDIOGRAM COMPLETE: Weight: 2821.89 oz

## 2019-01-19 LAB — PHOSPHORUS
Phosphorus: 3.2 mg/dL (ref 2.5–4.6)
Phosphorus: 3.8 mg/dL (ref 2.5–4.6)

## 2019-01-19 LAB — TSH: TSH: 0.746 u[IU]/mL (ref 0.350–4.500)

## 2019-01-19 LAB — RPR: RPR Ser Ql: NONREACTIVE

## 2019-01-19 MED ORDER — LORAZEPAM 2 MG/ML IJ SOLN
1.0000 mg | Freq: Once | INTRAMUSCULAR | Status: AC
  Administered 2019-01-19: 1 mg via INTRAVENOUS
  Filled 2019-01-19: qty 1

## 2019-01-19 MED ORDER — FENTANYL BOLUS VIA INFUSION
25.0000 ug | INTRAVENOUS | Status: DC | PRN
  Administered 2019-01-20 – 2019-01-24 (×24): 25 ug via INTRAVENOUS
  Filled 2019-01-19: qty 25

## 2019-01-19 MED ORDER — MIDAZOLAM HCL 2 MG/2ML IJ SOLN
INTRAMUSCULAR | Status: AC
  Administered 2019-01-19: 2 mg
  Filled 2019-01-19: qty 4

## 2019-01-19 MED ORDER — POTASSIUM CHLORIDE 10 MEQ/100ML IV SOLN
10.0000 meq | INTRAVENOUS | Status: AC
  Administered 2019-01-19 (×2): 10 meq via INTRAVENOUS
  Filled 2019-01-19 (×2): qty 100

## 2019-01-19 MED ORDER — PHENYLEPHRINE HCL-NACL 10-0.9 MG/250ML-% IV SOLN
INTRAVENOUS | Status: AC
  Administered 2019-01-19: 10 ug/min
  Filled 2019-01-19: qty 250

## 2019-01-19 MED ORDER — DILTIAZEM HCL-DEXTROSE 125-5 MG/125ML-% IV SOLN (PREMIX)
5.0000 mg/h | INTRAVENOUS | Status: DC
  Administered 2019-01-19: 5 mg/h via INTRAVENOUS
  Filled 2019-01-19 (×3): qty 125

## 2019-01-19 MED ORDER — FENTANYL CITRATE (PF) 100 MCG/2ML IJ SOLN
100.0000 ug | Freq: Once | INTRAMUSCULAR | Status: AC
  Administered 2019-01-19: 100 ug via INTRAVENOUS

## 2019-01-19 MED ORDER — CHLORHEXIDINE GLUCONATE CLOTH 2 % EX PADS
6.0000 | MEDICATED_PAD | Freq: Every day | CUTANEOUS | Status: DC
  Administered 2019-01-19 – 2019-01-21 (×2): 6 via TOPICAL

## 2019-01-19 MED ORDER — FENTANYL CITRATE (PF) 100 MCG/2ML IJ SOLN: 25.0000 ug | Freq: Once | INTRAMUSCULAR | Status: DC

## 2019-01-19 MED ORDER — LORAZEPAM 2 MG/ML IJ SOLN
2.0000 mg | Freq: Once | INTRAMUSCULAR | Status: AC
  Administered 2019-01-19: 2 mg via INTRAVENOUS

## 2019-01-19 MED ORDER — DEXMEDETOMIDINE HCL IN NACL 400 MCG/100ML IV SOLN
0.0000 ug/kg/h | INTRAVENOUS | Status: AC
  Administered 2019-01-19: 0.6 ug/kg/h via INTRAVENOUS
  Administered 2019-01-19: 1.2 ug/kg/h via INTRAVENOUS
  Administered 2019-01-20: 1 ug/kg/h via INTRAVENOUS
  Administered 2019-01-20: 02:00:00 0.6 ug/kg/h via INTRAVENOUS
  Administered 2019-01-20: 1.2 ug/kg/h via INTRAVENOUS
  Administered 2019-01-20: 0.6 ug/kg/h via INTRAVENOUS
  Administered 2019-01-21 (×3): 1.2 ug/kg/h via INTRAVENOUS
  Administered 2019-01-21: 0.9 ug/kg/h via INTRAVENOUS
  Administered 2019-01-21 – 2019-01-22 (×4): 1.2 ug/kg/h via INTRAVENOUS
  Filled 2019-01-19 (×16): qty 100

## 2019-01-19 MED ORDER — FENTANYL CITRATE (PF) 100 MCG/2ML IJ SOLN
INTRAMUSCULAR | Status: AC
  Filled 2019-01-19: qty 4

## 2019-01-19 MED ORDER — LORAZEPAM 2 MG/ML IJ SOLN
INTRAMUSCULAR | Status: AC
  Filled 2019-01-19: qty 1

## 2019-01-19 MED ORDER — CHLORHEXIDINE GLUCONATE 0.12% ORAL RINSE (MEDLINE KIT)
15.0000 mL | Freq: Two times a day (BID) | OROMUCOSAL | Status: DC
  Administered 2019-01-19 – 2019-02-11 (×48): 15 mL via OROMUCOSAL

## 2019-01-19 MED ORDER — DILTIAZEM LOAD VIA INFUSION
10.0000 mg | Freq: Once | INTRAVENOUS | Status: AC
  Administered 2019-01-19: 10 mg via INTRAVENOUS
  Filled 2019-01-19: qty 10

## 2019-01-19 MED ORDER — AMIODARONE IV BOLUS ONLY 150 MG/100ML
150.0000 mg | Freq: Once | INTRAVENOUS | Status: AC
  Administered 2019-01-19: 150 mg via INTRAVENOUS
  Filled 2019-01-19: qty 100

## 2019-01-19 MED ORDER — ROCURONIUM BROMIDE 50 MG/5ML IV SOLN
75.0000 mg | Freq: Once | INTRAVENOUS | Status: AC
  Administered 2019-01-19: 75 mg via INTRAVENOUS

## 2019-01-19 MED ORDER — OSMOLITE 1.5 CAL PO LIQD
237.0000 mL | Freq: Four times a day (QID) | ORAL | Status: DC
  Administered 2019-01-19 – 2019-01-30 (×42): 237 mL
  Filled 2019-01-19 (×7): qty 237
  Filled 2019-01-19: qty 1000
  Filled 2019-01-19 (×18): qty 237
  Filled 2019-01-19: qty 1000
  Filled 2019-01-19 (×17): qty 237
  Filled 2019-01-19: qty 1000
  Filled 2019-01-19 (×2): qty 237
  Filled 2019-01-19: qty 1000
  Filled 2019-01-19 (×3): qty 237

## 2019-01-19 MED ORDER — PHENYLEPHRINE HCL-NACL 10-0.9 MG/250ML-% IV SOLN
0.0000 ug/min | INTRAVENOUS | Status: DC
  Administered 2019-01-21: 5 ug/min via INTRAVENOUS
  Administered 2019-01-22: 6 ug/min via INTRAVENOUS
  Administered 2019-01-22: 18 ug/min via INTRAVENOUS
  Filled 2019-01-19 (×5): qty 250

## 2019-01-19 MED ORDER — SODIUM CHLORIDE 0.9 % IV BOLUS
500.0000 mL | Freq: Once | INTRAVENOUS | Status: AC
  Administered 2019-01-19: 500 mL via INTRAVENOUS

## 2019-01-19 MED ORDER — MIDAZOLAM HCL 2 MG/2ML IJ SOLN: 2.0000 mg | Freq: Once | INTRAMUSCULAR | Status: AC

## 2019-01-19 MED ORDER — ORAL CARE MOUTH RINSE
15.0000 mL | OROMUCOSAL | Status: DC
  Administered 2019-01-19 – 2019-02-11 (×222): 15 mL via OROMUCOSAL

## 2019-01-19 MED ORDER — FENTANYL 2500MCG IN NS 250ML (10MCG/ML) PREMIX INFUSION
25.0000 ug/h | INTRAVENOUS | Status: DC
  Administered 2019-01-19: 100 ug/h via INTRAVENOUS
  Administered 2019-01-20: 75 ug/h via INTRAVENOUS
  Administered 2019-01-21: 150 ug/h via INTRAVENOUS
  Administered 2019-01-21: 50 ug/h via INTRAVENOUS
  Administered 2019-01-22: 25 ug/h via INTRAVENOUS
  Administered 2019-01-23: 200 ug/h via INTRAVENOUS
  Filled 2019-01-19 (×8): qty 250

## 2019-01-19 MED ORDER — ETOMIDATE 2 MG/ML IV SOLN
20.0000 mg | Freq: Once | INTRAVENOUS | Status: AC
  Administered 2019-01-19: 20 mg via INTRAVENOUS

## 2019-01-19 MED ORDER — POTASSIUM CHLORIDE 10 MEQ/100ML IV SOLN
10.0000 meq | Freq: Once | INTRAVENOUS | Status: AC
  Administered 2019-01-19: 10 meq via INTRAVENOUS
  Filled 2019-01-19: qty 100

## 2019-01-19 MED ORDER — PRO-STAT SUGAR FREE PO LIQD
30.0000 mL | Freq: Three times a day (TID) | ORAL | Status: DC
  Administered 2019-01-19 – 2019-02-06 (×54): 30 mL
  Filled 2019-01-19 (×54): qty 30

## 2019-01-19 NOTE — ED Notes (Signed)
Staff from 49M came to assist with transfer to floor-- pt became combative, swinging arms, kicking legs-- order received verbally for Ativan from CCM--  Pt placed back into 4 pt restraints, posey belt placed by ICU nurses. trransferred to 49M with 3 staff from ICU>

## 2019-01-19 NOTE — Progress Notes (Signed)
SLP Cancellation Note  Patient Details Name: [name] MRN: [id number] DOB: 1950/08/27   Cancelled treatment:       Reason Eval/Treat Not Completed: Patient not medically ready (intubated). Will f/u for speech-language evaluation after extubation. Note that pt was struggling to manage secretions prior to intubation - recommend consideration for swallow evaluation once extubated as well.  Please reach out with any needs over the weekend (pager: [phone]); otherwise, we will check in early next week on his status.   [name] 01/19/2019, 10:44 AM  [name], M.A. [place] [facility] Environmental education officer [phone] Office [phone]

## 2019-01-19 NOTE — ED Notes (Signed)
Report given to [facility] on 106M pt can gp upstairs at 0730am.

## 2019-01-19 NOTE — Progress Notes (Signed)
[place] Progress Note Patient Name: CHAMAR PARMENTIER DOB: [birth year] MRN: [id number]   Date of Service  01/19/2019  HPI/Events of Note  Afib with RVR not controlled by Cardizem infusion  eICU Interventions  Amiodarone bolus ordered, if rate not controlled by bolus will order Amiodarone infusion.        Kerry Kass Anzel Kearse 01/19/2019, 2:53 AM

## 2019-01-19 NOTE — Procedures (Signed)
OGT Placement By MD  Placed under direct laryngoscopy and confirmed by auscultation  Rush Farmer, M.D. [facility] Pulmonary/Critical Care Medicine.

## 2019-01-19 NOTE — Progress Notes (Signed)
Pt was transported to and from CT via vent without any complications. Pt back in room 3M06 doing well at this time. RT will continue to monitor pt status.

## 2019-01-19 NOTE — Progress Notes (Signed)
  Echocardiogram 2D Echocardiogram has been performed.  [name] Killings 01/19/2019, 3:34 PM

## 2019-01-19 NOTE — ED Notes (Signed)
Patient pulled off condom cath. This is the 3rd time since 11p that pt has soiled himself and his gown. Attempted to redirect patient with no resolve.

## 2019-01-19 NOTE — Progress Notes (Signed)
NAME:  CHRISTIPHER CASUCCI, MRN:  [id number], DOB:  12-01-1950, LOS: 1 ADMISSION DATE:  01/18/2019, CONSULTATION DATE:  01/18/19 REFERRING MD:  Leonel Ramsay, CHIEF COMPLAINT:  Unable to elicit   Brief History   57yM with hypertensive emergency in setting of acute L MCA stroke s/p tPA, AF/RVR   History of present illness   Mr. Mcgaffigan is [phone] with chart history of atrial fibrillation on warfarin, COPD, benzodiazepine use on whom we were consulted for management of hypertensive emergency, AF/RVR, and increased work of breathing. History is obtained through chart review as the patient is aphasic and I am unable to get in touch with the friend who alerted EMS this evening, [name] (phone number listed below). He had just returned from a year long trip to the [place] 12/1 and had a lapse in filling his warfarin until 12/8. Was in [place] till 1830 on night of admission when he was observed to be leaning to his right and had difficulty with speech. Code stroke activated, tPA administered with improvement in his R hemiparesis and speech.   Past Medical History  AF COPD CVA PAD  Significant Hospital Events   12/10 tPA administered at 1946  Consults:  PCCM  Procedures:  none  Significant Diagnostic Tests:  CTA h/n: distal MCA branches that lose opacification and there could be a few small emboli in the left MCA territory  Micro Data:  none  Antimicrobials:  none  Interim history/subjective:  Severe agitation, chocking on secretion upon arrival to the ICU  Objective   Blood pressure (!) 157/109, pulse 77, temperature (!) 97.5 F (36.4 C), resp. rate 19, weight 80 kg, SpO2 100 %.    Vent Mode: PRVC FiO2 (%):  [100 %] 100 % Set Rate:  [16 bmp] 16 bmp Vt Set:  [540 mL] 540 mL PEEP:  [5 cmH20] 5 cmH20 Plateau Pressure:  [14 cmH20] 14 cmH20   Intake/Output Summary (Last 24 hours) at 01/19/2019 0933 Last data filed at 01/18/2019 2250 Gross per 24 hour  Intake 147.91 ml    Output --  Net 147.91 ml   Filed Weights   01/18/19 1900  Weight: 80 kg    Examination: General: chronically ill appearing male, NAD HENT: [place]/AT, PERRL, EOM-I and MMM, ETT in place now Lungs: Coarse BS from transmitted upper airway sounds Cardiovascular: RRR, Nl S1/S2 and -M/R/G Abdomen: Soft, NT, ND and +BS Extremities: warm, well-perfused without cyanosis, edema Neuro: PERRL, awake, alert not following commands, R facial droop, R hemiparesis  I reviewed CXR myself, no acute disease noted  Discussed with [facility] Problem list   n/a  Assessment & Plan:   # Acute stroke with distal branch occlusions of L MCA: - Neuro admitting: A1c, lipids, MRI, neuro checks, ASA, PT, OT, speech - Echo with bubble pending - Intubate for airway protection  VDRF: - Intubate emergently - Precedex and fentanyl drips - ABG now and adjust vent - ABG and CXR in AM - CXR in AM - VAP prevention  # Hypertensive emergency:  - Cleviprex for goal <180/105 given tpa   # Atrial fibrillation with RVR: - Check tsh, free t4 - On diltiazem and metoprolol chronically at home, hold for now - Heparin gtt at 24h post-tpa  # Chronic benzodiazepine use: - CIWA   # Hypoxia, tachypnea: Mild. may be result of aspiration pneumonitis vs agitation.  - Check vbg, covid-19  PCCM will continue to manage  Best practice:  Diet: NPO Pain/Anxiety/Delirium protocol (if  indicated): ciwa  VAP protocol (if indicated):  DVT prophylaxis: SCDs GI prophylaxis: not indicated Glucose control: q6 checks Mobility: Bed-level Code Status: Full - to be confirmed Family Communication: Marcy Siren [phone] attempted to contact but unable to leave message Disposition: Neuro ICU  Labs   CBC: Recent Labs  Lab 01/18/19 1923 01/18/19 1928 01/19/19 0059 01/19/19 0437  WBC 11.1*  --   --  11.7*  NEUTROABS 6.8  --   --   --   HGB 15.1 15.3 15.3 14.0  HCT 43.7 45.0 45.0 40.5  MCV 103.3*  --    --  102.5*  PLT 244  --   --  [id number]    Basic Metabolic Panel: Recent Labs  Lab 01/18/19 1923 01/18/19 1928 01/19/19 0059  NA 139 139 138  K 3.3* 3.4* 3.3*  CL 106 104  --   CO2 22  --   --   GLUCOSE 107* 106*  --   BUN 11 13  --   CREATININE 0.94 0.80  --   CALCIUM 9.5  --   --    GFR: CrCl cannot be calculated (Unknown ideal weight.). Recent Labs  Lab 01/18/19 1923 01/18/19 1951 01/18/19 2339 01/19/19 0437  WBC 11.1*  --   --  11.7*  LATICACIDVEN  --  2.8* 4.4*  --     Liver Function Tests: Recent Labs  Lab 01/18/19 1923  AST 22  ALT 23  ALKPHOS 61  BILITOT 1.7*  PROT 6.5  ALBUMIN 3.7   No results for input(s): LIPASE, AMYLASE in the last 168 hours. No results for input(s): AMMONIA in the last 168 hours.  ABG    Component Value Date/Time   HCO3 22.7 01/19/2019 0059   TCO2 24 01/19/2019 0059   O2SAT 100.0 01/19/2019 0059     Coagulation Profile: Recent Labs  Lab 01/18/19 1923  INR 1.1    Cardiac Enzymes: No results for input(s): CKTOTAL, CKMB, CKMBINDEX, TROPONINI in the last 168 hours.  HbA1C: Hgb A1c MFr Bld  Date/Time Value Ref Range Status  01/19/2019 04:37 AM 5.1 4.8 - 5.6 % Final    Comment:    (NOTE) Pre diabetes:          5.7%-6.4% Diabetes:              >6.4% Glycemic control for   <7.0% adults with diabetes   11/25/2014 01:34 PM 5.4 4.8 - 5.6 % Final    Comment:    (NOTE)         Pre-diabetes: 5.7 - 6.4         Diabetes: >6.4         Glycemic control for adults with diabetes: <7.0    CBG: Recent Labs  Lab 01/18/19 1924 01/18/19 2242 01/19/19 0851  GLUCAP 97 151* 95   The patient is critically ill with multiple organ systems failure and requires high complexity decision making for assessment and support, frequent evaluation and titration of therapies, application of advanced monitoring technologies and extensive interpretation of multiple databases.   Critical Care Time devoted to patient care services described in  this note is  70  Minutes. This time reflects time of care of this signee Dr [name]. This critical care time does not reflect procedure time, or teaching time or supervisory time of PA/NP/Med student/Med Resident etc but could involve care discussion time.  Rush Farmer, M.D. [facility] Pulmonary/Critical Care Medicine.

## 2019-01-19 NOTE — Procedures (Signed)
Echo attempted. Nurse asked that I attempt again later.

## 2019-01-19 NOTE — ED Notes (Signed)
Pt finally resting (not fighting); arousable to voice. Will continue to monitor.

## 2019-01-19 NOTE — Progress Notes (Signed)
STROKE TEAM PROGRESS NOTE   HISTORY OF PRESENT ILLNESS (per record) [name] is a 68 y.o. male with a history of atrial fibrillation who typically takes Coumadin, however was presumably unable to obtain this while in the [place].  He just returned from the [place] on December 1 and got his Coumadin filled on December 8.  Tonight, he was in his normal state of health at 6:30 PM, and then had an abrupt change leaning to the right and becoming aphasic.  EMS was called and code stroke was activated en route. LKW: [id number] tpa given?:  Yes Modified Rankin Scale: 0-Completely asymptomatic and back to baseline post- stroke   INTERVAL HISTORY Patient had progressive agitation overnight requiring increasing sedation with doses of Ativan and Haldol and eventually leading to intubation and sedation with fentanyl and Precedex drip earlier this morning.  He is resting quietly on sedation right now.  He is slightly hypotensive and getting normal saline bolus.  I have personally reviewed history of presenting illness, electronic medical records and imaging films in PACS   OBJECTIVE Vitals:   01/19/19 1030 01/19/19 1045 01/19/19 1100 01/19/19 1101  BP: (!) 87/67 (!) 86/60 (!) 85/61 (!) 85/61  Pulse: 68 67 63 68  Resp: 16 16 16 12   Temp:      SpO2: 100% 100% 100% 100%  Weight:        CBC:  Recent Labs  Lab 01/18/19 1923 01/19/19 0437 01/19/19 1054  WBC 11.1* 11.7*  --   NEUTROABS 6.8  --   --   HGB 15.1 14.0 12.9*  HCT 43.7 40.5 38.0*  MCV 103.3* 102.5*  --   PLT 244 238  --     Basic Metabolic Panel:  Recent Labs  Lab 01/18/19 1923 01/18/19 1928 01/19/19 0059 01/19/19 1054  NA 139 139 138 139  K 3.3* 3.4* 3.3* 3.1*  CL 106 104  --   --   CO2 22  --   --   --   GLUCOSE 107* 106*  --   --   BUN 11 13  --   --   CREATININE 0.94 0.80  --   --   CALCIUM 9.5  --   --   --     Lipid Panel:     Component Value Date/Time   CHOL 238 (H) 01/19/2019 0437   TRIG 64  01/19/2019 0437   HDL 43 01/19/2019 0437   CHOLHDL 5.5 01/19/2019 0437   VLDL 13 01/19/2019 0437   LDLCALC 182 (H) 01/19/2019 0437   HgbA1c:  Lab Results  Component Value Date   HGBA1C 5.1 01/19/2019   Urine Drug Screen:     Component Value Date/Time   LABOPIA NONE DETECTED 07/13/2016 1428   COCAINSCRNUR NONE DETECTED 07/13/2016 1428   LABBENZ POSITIVE (A) 07/13/2016 1428   AMPHETMU NONE DETECTED 07/13/2016 1428   THCU POSITIVE (A) 07/13/2016 1428   LABBARB NONE DETECTED 07/13/2016 1428    Alcohol Level     Component Value Date/Time   ETH <5 02/09/2015 0010    IMAGING  CT Code Stroke CTA Head W/WO contrast CT Code Stroke CTA Neck W/WO contrast 01/18/2019 IMPRESSION:   Atherosclerotic disease at both carotid bifurcations but no stenosis.   Prominent soft plaque at the left ICA bulb.   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   Dominant right vertebral artery widely patent.   No intracranial large or medium vessel  occlusion or correctable proximal stenosis, with specific attention to the left MCA branches.   I do think there are a few distal MCA branches that lose opacification and there could be a few small emboli in the left MCA territory. Vessels marked with arrows.   Distal vessel atherosclerotic irregularity, particularly evident in the PCA branches.   Aortic Atherosclerosis (ICD10-I70.0)   Emphysema (ICD10-J43.9).   DG Chest Portable 1 View 01/18/2019 IMPRESSION:  Right basilar opacities may indicate aspiration or pneumonia.   CT HEAD CODE STROKE WO CONTRAST 01/18/2019  IMPRESSION: 1. No acute finding by CT. Chronic small-vessel ischemic changes of the white matter. Old right cerebellar and right occipital infarctions.  2. ASPECTS is 10   MRI Wo Contrast - pending  CT Head Wo Contrast - pending   Transthoracic Echocardiogram  00/00/2020 Pending  ECG atrial fibrillation -  ventricular response 133 BPM (See cardiology reading for complete details)  PHYSICAL EXAM Blood pressure (!) 85/61, pulse 68, temperature (!) 97.5 F (36.4 C), resp. rate 12, weight 80 kg, SpO2 100 %.   elderly Caucasian male not in distress.  He is sedated and intubated. Afebrile. Head is nontraumatic. Neck is supple without bruit.    Cardiac exam no murmur or gallop. Lungs are clear to auscultation. Distal pulses are well felt.  Neurological Exam : Patient is sedated intubated hence exam is limited.  Eyes are closed.  He tries to partially open eyes to sternal rub.  Eyes deviated to the left.  Pupils irregular equal reactive fundi not visualized.  Face appears symmetric.  Tongue midline.  He does have spontaneous respiratory effort above ventilator settings.  Corneal reflexes present.  Doll's eye movements are sluggish.  No spontaneous extremity movements but withdraws left upper and lower extremity purposefully to noxious stimuli.  Trace withdrawal in the right lower extremity to pain and none in the right upper extremity.  Flaccid hypotonia on the right.  Right plantar upgoing left downgoing.  Gait not tested    ASSESSMENT/PLAN [name] is a 68 y.o. male with history of tobacco use, previous strokes, COPD, AAA, ASPVD, htn, hld, pulmonary nodule, depression, atrial fibrillation on coumadin but recently missed doses presenting with transient aphasia and leaning to the right. The patient received IV t-PA Thursday 01/18/19 @ 2000.   Suspected stroke treated with t-PA: MRI - pending  Resultant aphasia right hemiplegia and agitation  Code Stroke CT Head - No acute finding by CT. Chronic small-vessel ischemic changes of the white matter. Old right cerebellar and right occipital infarctions. ASPECTS is 10   CT head - pending  MRI head - pending  MRA head - not ordered  CTA H&N - Prominent soft plaque at the left ICA bulb. Left vertebral artery occluded at its origin and  reconstituted in the distal cervical region by collaterals. There could be a few small emboli in the left MCA territory.  CT Perfusion - not ordered  Carotid Doppler - CTA neck performed - carotid dopplers not indicated.  2D Echo - pending  Lacey Jensen Virus 2 - negative  LDL - 182  HgbA1c - 5.1  UDS - not ordered  VTE prophylaxis - scds Diet  Diet Order            Diet NPO time specified  Diet effective now              warfarin daily prior to admission, now on No antithrombotic s/p t-PA  Patient counseled to be compliant with  his antithrombotic medications  Ongoing aggressive stroke risk factor management  Therapy recommendations:  pending  Disposition:  Pending  Hypertension  Home BP meds: Diltiazem, Lasix and metoprolol  Current BP meds: Cleviprex ; Labetalol ; Cardene ; Cardizem  Hypertensive emergency / Hypotension - SBP - 80's . Permissive hypertension (OK if < 220/120) but gradually normalize in 5-7 days  . Long-term BP goal normotensive  Hyperlipidemia  Home Lipid lowering medication: Lipitor 40 mg daily  LDL 182, goal < 70  Current lipid lowering medication: NPO / Lipitor 80 mg daily   Continue statin at discharge  Other Stroke Risk Factors  Advanced age  Cigarette smoker - advised to stop smoking  ETOH use, advised to drink no more than 1 alcoholic beverage per day.  Overweight, Body mass index is 26.82 kg/m., recommend weight loss, diet and exercise as appropriate   Hx stroke/TIA  Atrial fibrillation  Other Active Problems  Hypotension - SBP - 80's  Hypokalemia - likely due to Lasix - 3.3->3.4->3.3->3.1->supplemented  Mild leucocytosis - 11.1->11.7 (temp 97.5)  Aspiration pneumonia - by CXR  NPO - tube feedings  Mild anemia - Hb - 12.9 - likely secondary to rehydration   Afib with RVR - Amiodarone - Cardizem drip       Hospital day # 1 Patient has history of atrial fibrillation but was on suboptimal  anticoagulation and presented with aphasia right hemiplegia.  He received IV TPA but became agitated and required intubation and sedation.  Recommends check stat CT scan of the head to rule out any post TPA bleed.  Maintain strict control of blood pressure as per post TPA protocol and close neurological monitoring.  Check MRI scan of the brain later when stable and echocardiogram, lipid profile and hemoglobin A1c.  Continue management of respiratory failure and suspected aspiration pneumonia as per critical care team.  No family available at the bedside for discussion.  Aspirin if no bleed on CT and eventually will transition to anticoagulation with warfarin when able to swallow.  Discussed with critical care team This patient is critically ill and at significant risk of neurological worsening, death and care requires constant monitoring of vital signs, hemodynamics,respiratory and cardiac monitoring, extensive review of multiple databases, frequent neurological assessment, discussion with family, other specialists and medical decision making of high complexity.I have made any additions or clarifications directly to the above note.This critical care time does not reflect procedure time, or teaching time or supervisory time of PA/NP/Med Resident etc but could involve care discussion time.  I spent 30 minutes of neurocritical care time  in the care of  this patient.  Antony Contras, MD  To contact Stroke Continuity provider, please refer to [email or web address] After hours, contact General Neurology

## 2019-01-19 NOTE — Progress Notes (Signed)
Patient with continued agitation. Will repeat ativan 1mg  x 1.   Roland Rack, MD [facility] [phone]  If 7pm- 7am, please page neurology on call as listed in [place].

## 2019-01-19 NOTE — Procedures (Signed)
Intubation Procedure Note [name] [id number] [birth year]  Procedure: Intubation Indications: Airway protection and maintenance  Procedure Details Consent: Unable to obtain consent because of emergent medical necessity. Time Out: Verified patient identification, verified procedure, site/side was marked, verified correct patient position, special equipment/implants available, medications/allergies/relevent history reviewed, required imaging and test results available.  Performed  Maximum sterile technique was used including gloves, hand hygiene and mask.  MAC    Evaluation Hemodynamic Status: BP stable throughout; O2 sats: stable throughout Patient's Current Condition: stable Complications: No apparent complications Patient did tolerate procedure well. Chest X-ray ordered to verify placement.  CXR: pending.   [name] 01/19/2019

## 2019-01-19 NOTE — Progress Notes (Signed)
 Initial Nutrition Assessment  DOCUMENTATION CODES:   Not applicable  INTERVENTION:   Tube Feeding:  Osmolite 1.5 bolus of 237 mL (1 carton/can) QID Pro-Stat 30 mL TID Provides 1720 kcals, 105 g of protein and 724 mL of free water Meets 100% protein needs, 105% calorie needs  Pt will need SLP evaluation prior to diet advancement once extubated  NUTRITION DIAGNOSIS:   Inadequate oral intake related to acute illness as evidenced by NPO status.  GOAL:   Patient will meet greater than or equal to 90% of their needs  MONITOR:   Vent status, TF tolerance, Labs, Weight trends  REASON FOR ASSESSMENT:   Consult, Ventilator Enteral/tube feeding initiation and management  ASSESSMENT:   68 yo male admitted with hypertensive emergency in setting of acute L MCA stroke, required intubation for airway protection, pt choking on own secretions. PMH includes COPD, AF, CVA, PAD.  RD working remotely.  12/10 Admitted with L MCA stroke 12/11 Intubated for airway protection  Patient is currently intubated on ventilator support MV: 8.9 L/min Temp (24hrs), Avg:97.5 F (36.4 C), Min:97.5 F (36.4 C), Max:97.5 F (36.4 C)  Propofol: NONE  OG tube placed by MD at time of intubation  Unable to obtain diet and weight history at this time.  Hypokalemic, recommend supplementing, check mag and phos and supplement if needed  Labs: potassium 3.1 (L) Meds: NS at 50 ml/hr  Diet Order:   Diet Order            Diet NPO time specified  Diet effective now              EDUCATION NEEDS:   Not appropriate for education at this time  Skin:  Skin Assessment: Reviewed RN Assessment  Last BM:  no documented BM  Height:   Ht Readings from Last 1 Encounters:  07/17/16 5\' 8"  (1.727 m)    Weight:   Wt Readings from Last 1 Encounters:  01/18/19 80 kg     BMI:  Body mass index is 26.82 kg/m.  Estimated Nutritional Needs:   Kcal:  1625 kcals  Protein:  95-120 g  Fluid:   >/= 1.6 L    Sabastion Hrdlicka MS, RDN, LDN, CNSC [phone] Pager  [phone] Weekend/On-Call Pager

## 2019-01-19 NOTE — ED Notes (Signed)
Restraints had already been applied to patient by previous nurse due to pt's inability to follow directions, and pulling at lines and tubes. Restraints continued upon initial assessment of pt.

## 2019-01-19 NOTE — Procedures (Signed)
Central Venous Catheter Insertion Procedure Note TORAINO SCOPEL [id number] 11/02/1950  Procedure: Insertion of Central Venous Catheter Indications: Drug and/or fluid administration  Procedure Details Consent: Unable to obtain consent because of altered level of consciousness. Time Out: Verified patient identification, verified procedure, site/side was marked, verified correct patient position, special equipment/implants available, medications/allergies/relevent history reviewed, required imaging and test results available.  Performed  Maximum sterile technique was used including antiseptics, cap, gloves, gown, hand hygiene, mask and sheet. Skin prep: Chlorhexidine; local anesthetic administered A antimicrobial bonded/coated triple lumen catheter was placed in the left internal jugular vein using the Seldinger technique.  Evaluation Blood flow good Complications: No apparent complications Patient did tolerate procedure well. Chest X-ray ordered to verify placement.  CXR: pending.  Johnsie Cancel, NP-C [facility] Contact / Pager information can be found on Amion  01/19/2019, 2:06 PM

## 2019-01-20 ENCOUNTER — Inpatient Hospital Stay (HOSPITAL_COMMUNITY): Payer: Medicare Other

## 2019-01-20 DIAGNOSIS — E876 Hypokalemia: Secondary | ICD-10-CM

## 2019-01-20 DIAGNOSIS — Z978 Presence of other specified devices: Secondary | ICD-10-CM

## 2019-01-20 DIAGNOSIS — I9589 Other hypotension: Secondary | ICD-10-CM

## 2019-01-20 DIAGNOSIS — I6300 Cerebral infarction due to thrombosis of unspecified precerebral artery: Secondary | ICD-10-CM

## 2019-01-20 DIAGNOSIS — E7800 Pure hypercholesterolemia, unspecified: Secondary | ICD-10-CM

## 2019-01-20 DIAGNOSIS — I4819 Other persistent atrial fibrillation: Secondary | ICD-10-CM

## 2019-01-20 DIAGNOSIS — Z95828 Presence of other vascular implants and grafts: Secondary | ICD-10-CM

## 2019-01-20 LAB — POCT I-STAT 7, (LYTES, BLD GAS, ICA,H+H)
Acid-base deficit: 4 mmol/L — ABNORMAL HIGH (ref 0.0–2.0)
Bicarbonate: 21.1 mmol/L (ref 20.0–28.0)
Calcium, Ion: 1.3 mmol/L (ref 1.15–1.40)
HCT: 42 % (ref 39.0–52.0)
Hemoglobin: 14.3 g/dL (ref 13.0–17.0)
O2 Saturation: 91 %
Patient temperature: 98.6
Potassium: 3.7 mmol/L (ref 3.5–5.1)
Sodium: 136 mmol/L (ref 135–145)
TCO2: 22 mmol/L (ref 22–32)
pCO2 arterial: 37.6 mmHg (ref 32.0–48.0)
pH, Arterial: 7.357 (ref 7.350–7.450)
pO2, Arterial: 62 mmHg — ABNORMAL LOW (ref 83.0–108.0)

## 2019-01-20 LAB — BASIC METABOLIC PANEL
Anion gap: 9 (ref 5–15)
BUN: 16 mg/dL (ref 8–23)
CO2: 22 mmol/L (ref 22–32)
Calcium: 8.7 mg/dL — ABNORMAL LOW (ref 8.9–10.3)
Chloride: 108 mmol/L (ref 98–111)
Creatinine, Ser: 0.93 mg/dL (ref 0.61–1.24)
GFR calc Af Amer: >60 mL/min (ref >60)
GFR calc non Af Amer: >60 mL/min (ref >60)
Glucose, Bld: 89 mg/dL (ref 70–99)
Potassium: 4 mmol/L (ref 3.5–5.1)
Sodium: 139 mmol/L (ref 135–145)

## 2019-01-20 LAB — CBC
HCT: 42.1 % (ref 39.0–52.0)
Hemoglobin: 13.9 g/dL (ref 13.0–17.0)
MCH: 35 pg — ABNORMAL HIGH (ref 26.0–34.0)
MCHC: 33 g/dL (ref 30.0–36.0)
MCV: 106 fL — ABNORMAL HIGH (ref 80.0–100.0)
Platelets: 193 10*3/uL (ref 150–400)
RBC: 3.97 MIL/uL — ABNORMAL LOW (ref 4.22–5.81)
RDW: 14.6 % (ref 11.5–15.5)
WBC: 11 10*3/uL — ABNORMAL HIGH (ref 4.0–10.5)
nRBC: 0 % (ref 0.0–0.2)

## 2019-01-20 LAB — GLUCOSE, CAPILLARY
Glucose-Capillary: 87 mg/dL (ref 70–99)
Glucose-Capillary: 76 mg/dL (ref 70–99)
Glucose-Capillary: 120 mg/dL — ABNORMAL HIGH (ref 70–99)
Glucose-Capillary: 102 mg/dL — ABNORMAL HIGH (ref 70–99)
Glucose-Capillary: 92 mg/dL (ref 70–99)

## 2019-01-20 LAB — MAGNESIUM
Magnesium: 1.7 mg/dL (ref 1.7–2.4)
Magnesium: 1.7 mg/dL (ref 1.7–2.4)

## 2019-01-20 LAB — PHOSPHORUS
Phosphorus: 3.1 mg/dL (ref 2.5–4.6)
Phosphorus: 2.5 mg/dL (ref 2.5–4.6)

## 2019-01-20 LAB — HIV ANTIBODY (ROUTINE TESTING W REFLEX): HIV Screen 4th Generation wRfx: NONREACTIVE

## 2019-01-20 MED ORDER — CYANOCOBALAMIN 1000 MCG/ML IJ SOLN
1000.0000 ug | Freq: Once | INTRAMUSCULAR | Status: AC
  Administered 2019-01-20: 1000 ug via INTRAMUSCULAR
  Filled 2019-01-20: qty 1

## 2019-01-20 MED ORDER — ASPIRIN 325 MG PO TABS
325.0000 mg | ORAL_TABLET | Freq: Every day | ORAL | Status: DC
  Administered 2019-01-20 – 2019-01-25 (×6): 325 mg
  Filled 2019-01-20 (×6): qty 1

## 2019-01-20 MED ORDER — ATORVASTATIN CALCIUM 80 MG PO TABS
80.0000 mg | ORAL_TABLET | Freq: Every day | ORAL | Status: DC
  Administered 2019-01-21 – 2019-02-19 (×29): 80 mg
  Filled 2019-01-20: qty 1
  Filled 2019-01-20 (×2): qty 2
  Filled 2019-01-20 (×2): qty 1
  Filled 2019-01-20 (×5): qty 2
  Filled 2019-01-20: qty 1
  Filled 2019-01-20: qty 2
  Filled 2019-01-20 (×3): qty 1
  Filled 2019-01-20 (×3): qty 2
  Filled 2019-01-20 (×5): qty 1
  Filled 2019-01-20: qty 2
  Filled 2019-01-20: qty 1
  Filled 2019-01-20: qty 2
  Filled 2019-01-20: qty 1
  Filled 2019-01-20: qty 2
  Filled 2019-01-20 (×2): qty 1

## 2019-01-20 MED ORDER — PANTOPRAZOLE SODIUM 40 MG PO PACK
40.0000 mg | PACK | Freq: Every day | ORAL | Status: DC
  Administered 2019-01-20 – 2019-02-06 (×18): 40 mg
  Filled 2019-01-20 (×19): qty 20

## 2019-01-20 MED ORDER — MIDAZOLAM HCL 2 MG/2ML IJ SOLN
INTRAMUSCULAR | Status: AC
  Filled 2019-01-20: qty 2

## 2019-01-20 MED ORDER — HEPARIN SODIUM (PORCINE) 5000 UNIT/ML IJ SOLN
5000.0000 [IU] | Freq: Three times a day (TID) | INTRAMUSCULAR | Status: DC
  Administered 2019-01-20 – 2019-01-25 (×15): 5000 [IU] via SUBCUTANEOUS
  Filled 2019-01-20 (×15): qty 1

## 2019-01-20 MED ORDER — MIDAZOLAM 50MG/50ML (1MG/ML) PREMIX INFUSION
0.5000 mg/h | INTRAVENOUS | Status: DC
  Administered 2019-01-20 – 2019-01-23 (×4): 0.5 mg/h via INTRAVENOUS
  Filled 2019-01-20 (×4): qty 50

## 2019-01-20 MED ORDER — MIDAZOLAM HCL 2 MG/2ML IJ SOLN
2.0000 mg | Freq: Once | INTRAMUSCULAR | Status: AC
  Administered 2019-01-20: 2 mg via INTRAVENOUS

## 2019-01-20 MED ORDER — ASPIRIN EC 325 MG PO TBEC
325.0000 mg | DELAYED_RELEASE_TABLET | Freq: Every day | ORAL | Status: DC
  Filled 2019-01-20: qty 1

## 2019-01-20 MED ORDER — VITAMIN B-12 1000 MCG PO TABS
1000.0000 ug | ORAL_TABLET | Freq: Every day | ORAL | Status: DC
  Administered 2019-01-21 – 2019-02-11 (×22): 1000 ug
  Filled 2019-01-20 (×22): qty 1

## 2019-01-20 MED ORDER — ASPIRIN 300 MG RE SUPP: 300.0000 mg | Freq: Every day | RECTAL | Status: DC

## 2019-01-20 MED ORDER — MIDAZOLAM HCL 2 MG/2ML IJ SOLN
2.0000 mg | Freq: Once | INTRAMUSCULAR | Status: AC
  Administered 2019-01-20: 12:00:00 2 mg via INTRAVENOUS

## 2019-01-20 MED ORDER — ASPIRIN EC 325 MG PO TBEC: 325.0000 mg | DELAYED_RELEASE_TABLET | Freq: Every day | ORAL | Status: DC

## 2019-01-20 NOTE — Progress Notes (Signed)
Walked into patients room to find patient sitting upright in bed, thrashing head back and forth. Patient was in 4 point restraints at this time. Patients ETT was out a few cm however still intact. Paged RRT to come reposition tube, however patient becoming increasingly combative and agitated. Paged Dr. Vaughan Browner for orders. Give 2 mg of versed at this time and start versed drip.

## 2019-01-20 NOTE — Progress Notes (Signed)
Patient was being examined by physician and became very agitated and combative. Patient was thrashing head back and forth in the bed. Turned back on sedation at this time and notified CCM physician. Given 2 mg of versed.

## 2019-01-20 NOTE — Progress Notes (Signed)
NAME:  [name], MRN:  [id number], DOB:  19-May-1950, LOS: 2 ADMISSION DATE:  01/18/2019, CONSULTATION DATE:  01/18/19 REFERRING MD:  Leonel Ramsay, CHIEF COMPLAINT:  Unable to elicit   Brief History   37yM with hypertensive emergency in setting of acute L MCA stroke s/p tPA, AF/RVR  Has been noncompliant with his warfarin for A. fib. Code stroke activated, TPA administered Intubated for respiratory failure.  PCCM consulted for vent management  Past Medical History  AF COPD CVA PAD  Significant Hospital Events   12/10 tPA administered at 1946 12/11-intubated  Consults:  PCCM  Procedures:  none  Significant Diagnostic Tests:  CTA h/n: distal MCA branches that lose opacification and there could be a few small emboli in the left MCA territory  Micro Data:  none  Antimicrobials:  none  Interim history/subjective:  Remains on the ventilator.  Has intermittent agitation. On fentanyl, Precedex drip  Objective   Blood pressure 100/76, pulse 81, temperature 98.2 F (36.8 C), temperature source Axillary, resp. rate 12, weight 72.3 kg, SpO2 98 %.    Vent Mode: PRVC FiO2 (%):  [40 %] 40 % Set Rate:  [12 bmp] 12 bmp Vt Set:  [540 mL] 540 mL PEEP:  [5 cmH20] 5 cmH20 Plateau Pressure:  [13 cmH20-18 cmH20] 18 cmH20   Intake/Output Summary (Last 24 hours) at 01/20/2019 1111 Last data filed at 01/20/2019 0600 Gross per 24 hour  Intake 1940.79 ml  Output 1410 ml  Net 530.79 ml   Filed Weights   01/18/19 1900 01/20/19 0500  Weight: 80 kg 72.3 kg    Examination: Gen:      Chronically ill-appearing HEENT:  EOMI, sclera anicteric Neck:     No masses; no thyromegaly, ETT Lungs:    Clear to auscultation bilaterally; normal respiratory effort CV:         Regular rate and rhythm; no murmurs Abd:      + bowel sounds; soft, non-tender; no palpable masses, no distension Ext:    No edema; adequate peripheral perfusion Skin:      Warm and dry; no rash Neuro: Sedated,  unresponsive  Resolved Hospital Problem list   n/a  Assessment & Plan:  Acute stroke with distal branch occlusions of L MCA S/p TPA Management per neurology.  Acute respiratory failure due to stroke Continue full vent support. Pressure support weans as tolerated. Follow chest x-ray, ABG.  Hypertensive emergency Atrial fibrillation with RVR Off Cleviprex drip. Will check with neurology when we can resume anticoagulation Telemetry monitor  Chronic benzodiazepine use, ?  Alcohol use Precedex  Best practice:  Diet: NPO Pain/Anxiety/Delirium protocol (if indicated): precedex  VAP protocol (if indicated):  DVT prophylaxis: SCDs GI prophylaxis: not indicated Glucose control: q6 checks Mobility: Bed-level Code Status: Full - to be confirmed Family Communication: Per primary Disposition:  ICU  Labs   CBC: Recent Labs  Lab 01/18/19 1923 01/19/19 0059 01/19/19 0437 01/19/19 1054 01/20/19 0555 01/20/19 1022  WBC 11.1*  --  11.7*  --   --  11.0*  NEUTROABS 6.8  --   --   --   --   --   HGB 15.1 15.3 14.0 12.9* 14.3 13.9  HCT 43.7 45.0 40.5 38.0* 42.0 42.1  MCV 103.3*  --  102.5*  --   --  106.0*  PLT 244  --  238  --   --  0000000    Basic Metabolic Panel: Recent Labs  Lab 01/18/19 1923 01/18/19 1928 01/19/19 0059 01/19/19  1054 01/19/19 1132 01/19/19 1706 01/20/19 0555 01/20/19 0713  NA 139 139 138 139  --   --  136 139  K 3.3* 3.4* 3.3* 3.1*  --   --  3.7 4.0  CL 106 104  --   --   --   --   --  108  CO2 22  --   --   --   --   --   --  22  GLUCOSE 107* 106*  --   --   --   --   --  89  BUN 11 13  --   --   --   --   --  16  CREATININE 0.94 0.80  --   --   --   --   --  0.93  CALCIUM 9.5  --   --   --   --   --   --  8.7*  MG  --   --   --   --  1.6* 1.7  --  1.7  PHOS  --   --   --   --  3.2 3.8  --  3.1   GFR: CrCl cannot be calculated (Unknown ideal weight.). Recent Labs  Lab 01/18/19 1923 01/18/19 1951 01/18/19 2339 01/19/19 0437 01/20/19 1022   WBC 11.1*  --   --  11.7* 11.0*  LATICACIDVEN  --  2.8* 4.4*  --   --     Liver Function Tests: Recent Labs  Lab 01/18/19 1923  AST 22  ALT 23  ALKPHOS 61  BILITOT 1.7*  PROT 6.5  ALBUMIN 3.7   No results for input(s): LIPASE, AMYLASE in the last 168 hours. No results for input(s): AMMONIA in the last 168 hours.  ABG    Component Value Date/Time   PHART 7.357 01/20/2019 0555   PCO2ART 37.6 01/20/2019 0555   PO2ART 62.0 (L) 01/20/2019 0555   HCO3 21.1 01/20/2019 0555   TCO2 22 01/20/2019 0555   ACIDBASEDEF 4.0 (H) 01/20/2019 0555   O2SAT 91.0 01/20/2019 0555     Coagulation Profile: Recent Labs  Lab 01/18/19 1923  INR 1.1    Cardiac Enzymes: No results for input(s): CKTOTAL, CKMB, CKMBINDEX, TROPONINI in the last 168 hours.  HbA1C: Hgb A1c MFr Bld  Date/Time Value Ref Range Status  01/19/2019 04:37 AM 5.1 4.8 - 5.6 % Final    Comment:    (NOTE) Pre diabetes:          5.7%-6.4% Diabetes:              >6.4% Glycemic control for   <7.0% adults with diabetes   11/25/2014 01:34 PM 5.4 4.8 - 5.6 % Final    Comment:    (NOTE)         Pre-diabetes: 5.7 - 6.4         Diabetes: >6.4         Glycemic control for adults with diabetes: <7.0    CBG: Recent Labs  Lab 01/19/19 1658 01/19/19 1943 01/19/19 2332 01/20/19 0414 01/20/19 0805  GLUCAP 84 91 108* 87 76   The patient is critically ill with multiple organ system failure and requires high complexity decision making for assessment and support, frequent evaluation and titration of therapies, advanced monitoring, review of radiographic studies and interpretation of complex data.   Critical Care Time devoted to patient care services, exclusive of separately billable procedures, described in this note is 35 minutes.   Dhiren Azimi  MD [facility] 01/20/2019, 11:16 AM

## 2019-01-20 NOTE — Progress Notes (Signed)
PT Cancellation Note  Patient Details Name: [name] MRN: [id number] DOB: October 21, 1950   Cancelled Treatment:    Reason Eval/Treat Not Completed: Patient not medically ready.  Now appropriately knocked out.  Not ready for therapies today. 01/20/2019  Donnella Sham, PT Acute Rehabilitation Services [phone]  (pager) [phone]  (office)   [name] 01/20/2019, 2:07 PM

## 2019-01-20 NOTE — Progress Notes (Signed)
STROKE TEAM PROGRESS NOTE   INTERVAL HISTORY Patient RN at bedside.  Patient still intubated, on vent, not able to extubate due to apnea twice this morning on weaning trials.  Patient agitated on ventilation, try to sit up, still on Precedex, off fentanyl already.  MRI showed left MCA scattered moderate infarcts.  Currently on aspirin.  Also on bolus tube feeds.   OBJECTIVE Vitals:   01/20/19 1000 01/20/19 1100 01/20/19 1129 01/20/19 1235  BP: 100/73 93/71    Pulse: 76 88 96   Resp: 18 16 18    Temp:    99.2 F (37.3 C)  TempSrc:    Axillary  SpO2: 100% 100% 100%   Weight:        CBC:  Recent Labs  Lab 01/18/19 1923 01/19/19 0437 01/20/19 0555 01/20/19 1022  WBC 11.1* 11.7*  --  11.0*  NEUTROABS 6.8  --   --   --   HGB 15.1 14.0 14.3 13.9  HCT 43.7 40.5 42.0 42.1  MCV 103.3* 102.5*  --  106.0*  PLT 244 238  --  0000000    Basic Metabolic Panel:  Recent Labs  Lab 01/18/19 1923 01/18/19 1928 01/19/19 0059 01/19/19 1706 01/20/19 0555 01/20/19 0713  NA 139 139  --   --  136 139  K 3.3* 3.4*  --   --  3.7 4.0  CL 106 104  --   --   --  108  CO2 22  --   --   --   --  22  GLUCOSE 107* 106*  --   --   --  89  BUN 11 13  --   --   --  16  CREATININE 0.94 0.80  --   --   --  0.93  CALCIUM 9.5  --   --   --   --  8.7*  MG  --   --    < > 1.7  --  1.7  PHOS  --   --    < > 3.8  --  3.1   < > = values in this interval not displayed.    Lipid Panel:     Component Value Date/Time   CHOL 238 (H) 01/19/2019 0437   TRIG 64 01/19/2019 0437   HDL 43 01/19/2019 0437   CHOLHDL 5.5 01/19/2019 0437   VLDL 13 01/19/2019 0437   LDLCALC 182 (H) 01/19/2019 0437   HgbA1c:  Lab Results  Component Value Date   HGBA1C 5.1 01/19/2019   Urine Drug Screen:     Component Value Date/Time   LABOPIA NONE DETECTED 07/13/2016 1428   COCAINSCRNUR NONE DETECTED 07/13/2016 1428   LABBENZ POSITIVE (A) 07/13/2016 1428   AMPHETMU NONE DETECTED 07/13/2016 1428   THCU POSITIVE (A) 07/13/2016  1428   LABBARB NONE DETECTED 07/13/2016 1428    Alcohol Level     Component Value Date/Time   ETH <5 02/09/2015 0010    IMAGING  CT Code Stroke CTA Head W/WO contrast CT Code Stroke CTA Neck W/WO contrast 01/18/2019 IMPRESSION:   Atherosclerotic disease at both carotid bifurcations but no stenosis.   Prominent soft plaque at the left ICA bulb.   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   Dominant right vertebral artery widely patent.   No intracranial large or medium vessel occlusion or correctable proximal stenosis, with specific attention to the left MCA branches.   I do think  there are a few distal MCA branches that lose opacification and there could be a few small emboli in the left MCA territory. Vessels marked with arrows.   Distal vessel atherosclerotic irregularity, particularly evident in the PCA branches.   Aortic Atherosclerosis (ICD10-I70.0)   Emphysema (ICD10-J43.9).   DG Chest Portable 1 View 01/18/2019 IMPRESSION:  Right basilar opacities may indicate aspiration or pneumonia.    DG Chest Portable 1 View 01/20/2019 IMPRESSION: 1. Stable lines and tubes. 2. Increasing left lower lobe collapse or consolidation since yesterday. 3. Stable ventilation elsewhere.  CT HEAD CODE STROKE WO CONTRAST 01/18/2019  IMPRESSION: 1. No acute finding by CT. Chronic small-vessel ischemic changes of the white matter. Old right cerebellar and right occipital infarctions.  2. ASPECTS is 10   MRI Wo Contrast  01/20/2019 IMPRESSION: 1. Multifocal acute ischemia within the left MCA territory, including the left postcentral gyrus. No hemorrhage or mass effect. 2. 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 3. Chronic ischemic microangiopathy.  CT Head Wo Contrast  01/19/2019 IMPRESSION: Interval demarcation of an acute cortically based infarct within the lateral left frontal lobe as described. No  acute intracranial hemorrhage or significant mass effect.   Transthoracic Echocardiogram  01/19/2019 IMPRESSIONS  1. Left ventricular ejection fraction, by visual estimation, is 55 to 60%. The left ventricle has normal function. There is moderately increased left ventricular hypertrophy.  2. The left ventricle demonstrates regional wall motion abnormalities.  3. Global right ventricle has normal systolic function.The right ventricular size is normal. No increase in right ventricular wall thickness.  4. Left atrial size was normal.  5. Right atrial size was mildly dilated.  6. The mitral valve is normal in structure. Mild to moderate mitral valve regurgitation.  7. The tricuspid valve is normal in structure. Tricuspid valve regurgitation is trivial.  8. The aortic valve is normal in structure. Aortic valve regurgitation is not visualized.  9. The pulmonic valve was normal in structure. Pulmonic valve regurgitation is not visualized. 10. The atrial septum is grossly normal.  ECG atrial fibrillation - ventricular response 133 BPM (See cardiology reading for complete details)  PHYSICAL EXAM Blood pressure 93/71, pulse 96, temperature 99.2 F (37.3 C), temperature source Axillary, resp. rate 18, weight 72.3 kg, SpO2 100 %.  General - Well nourished, well developed, intubated on Precedex, still agitated.  Ophthalmologic - fundi not visualized due to noncooperation.  Cardiovascular - irregularly irregular heart rate and rhythm.  Neuro - intubated on Precedex, eyes open, not following commands. With forced eye opening, eyes in mid position, left gaze preference, not crossing midline, not blinking to visual threat on the right, not tracking on the right but tracking on the left, PERRL. Corneal reflex present, gag and cough present. Breathing over the vent.  Facial symmetry not able to test due to ET tube.  Tongue protrusion not cooperative. On pain stimulation, moving left upper extremity more  aggressively then the right upper and lower extremities. DTR 1+ and no babinski. Sensation, coordination and gait not tested.    ASSESSMENT/PLAN Mr. SEYDOU ASANO is a 68 y.o. male with history of tobacco use, previous strokes, COPD, AAA, ASPVD, htn, hld, pulmonary nodule, depression, atrial fibrillation on coumadin but recently missed doses presenting with transient aphasia and leaning to the right. The patient received IV t-PA Thursday 01/18/19 @ 2000.  Stroke - left MCA infarct s/p t-PA - embolic pattern, likely due to atrial fibrillation with subtherapeutic INR  Resultant left gaze, right hemiparesis, right neglect and agitation  Code Stroke CT Head - No acute finding by CT. Chronic small-vessel ischemic changes of the white matter. Old right cerebellar and right occipital infarctions. ASPECTS is 10   CT head - Interval demarcation of an acute cortically based infarct within the lateral left frontal lobe as described. No acute intracranial hemorrhage or significant mass effect.  MRI head -  Multifocal acute ischemia within the left MCA territory, including the left postcentral gyrus. No hemorrhage or mass effect. 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 Chronic ischemic microangiopathy.  CTA H&N - Prominent soft plaque at the left ICA bulb. 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   2D Echo - EF 55 to 60%. No cardiac source of emboli identified.   Sars Corona Virus 2 - negative  LDL - 182  HgbA1c - 5.1  VTE prophylaxis -Heparin subq  warfarin daily prior to admission, now on ASA 325 mg daily given moderate sized infarct.  Will consider DOAC in 5 to 7 days  Ongoing aggressive stroke risk factor management  Therapy recommendations:  pending  Disposition:  Pending  Afib with RVR  On amiodarone and Cardizem drip  Still has RVR due to agitation  On warfarin PTA, INR 1.1 on admission  On aspirin for  now  We will consider DOAC in 5 to 7 days given moderate size infarct  AMS with agitation  Respiratory failure with hypoxia  Intubated on sedation  Continued on Precedex and fentanyl  Failed weaning today  CCM on board  B12 deficiency  Vitamin B12 = 161  B12 supplement  History of stroke  11/2014, bilateral blurry vision after left upper extremity bypass surgery.  Found to have left central vision loss.  MRI showed bilateral embolic infarcts.  INR 1.45.  Carotid Doppler negative.  TTE unremarkable.  LDL 82, A1c 5.4.  Continued on discharge with Coumadin and Lipitor 40  hypotension Hx of hypertension  Home BP meds: Diltiazem, Lasix and metoprolol  Current BP meds none  Now on neo . Avoid low BP  Hyperlipidemia  Home Lipid lowering medication: Lipitor 40 mg daily  LDL 182, goal < 70  Current lipid lowering medication: Lipitor 80 mg daily   Continue statin at discharge  Dysphagia  N.p.o.  Speech to follow once extubated  P.o. meds through tube  Tobacco abuse  Current smoker  Smoking cessation counseling will be provided  Pt is willing to quit  Other Stroke Risk Factors  Advanced age  ETOH use, advised to drink no more than 1 alcoholic beverage per day.  Other Active Problems  Hypokalemia - likely due to Lasix - 3.3->3.4->3.3->3.1->supplemented->4.0  Mild leucocytosis - 11.1->11.7->11.0 (temp 99.2)  Aspiration pneumonia - by CXR ; Repeat CXR 01/20/19 - Increasing left lower lobe collapse or consolidation since yesterday.    Hospital day # 2  This patient is critically ill and at significant risk of neurological worsening, death and care requires constant monitoring of vital signs, hemodynamics,respiratory and cardiac monitoring, extensive review of multiple databases, frequent neurological assessment, discussion with family, other specialists and medical decision making of high complexity. I spent 35 minutes of neurocritical care time  in the  care of  this patient.  Rosalin Hawking, MD PhD Stroke Neurology 01/20/2019 6:16 PM   To contact Stroke Continuity provider, please refer to [email or web address] After hours, contact General Neurology

## 2019-01-21 ENCOUNTER — Inpatient Hospital Stay (HOSPITAL_COMMUNITY): Payer: Medicare Other

## 2019-01-21 DIAGNOSIS — I4821 Permanent atrial fibrillation: Secondary | ICD-10-CM

## 2019-01-21 DIAGNOSIS — I4891 Unspecified atrial fibrillation: Secondary | ICD-10-CM

## 2019-01-21 DIAGNOSIS — R451 Restlessness and agitation: Secondary | ICD-10-CM

## 2019-01-21 DIAGNOSIS — J9611 Chronic respiratory failure with hypoxia: Secondary | ICD-10-CM

## 2019-01-21 DIAGNOSIS — J9601 Acute respiratory failure with hypoxia: Secondary | ICD-10-CM

## 2019-01-21 DIAGNOSIS — D72829 Elevated white blood cell count, unspecified: Secondary | ICD-10-CM

## 2019-01-21 DIAGNOSIS — R509 Fever, unspecified: Secondary | ICD-10-CM

## 2019-01-21 DIAGNOSIS — F17200 Nicotine dependence, unspecified, uncomplicated: Secondary | ICD-10-CM

## 2019-01-21 DIAGNOSIS — I63412 Cerebral infarction due to embolism of left middle cerebral artery: Principal | ICD-10-CM

## 2019-01-21 LAB — CBC
HCT: 40 % (ref 39.0–52.0)
Hemoglobin: 13.3 g/dL (ref 13.0–17.0)
MCH: 35.5 pg — ABNORMAL HIGH (ref 26.0–34.0)
MCHC: 33.3 g/dL (ref 30.0–36.0)
MCV: 106.7 fL — ABNORMAL HIGH (ref 80.0–100.0)
Platelets: 177 10*3/uL (ref 150–400)
RBC: 3.75 MIL/uL — ABNORMAL LOW (ref 4.22–5.81)
RDW: 14.6 % (ref 11.5–15.5)
WBC: 14 10*3/uL — ABNORMAL HIGH (ref 4.0–10.5)
nRBC: 0 % (ref 0.0–0.2)

## 2019-01-21 LAB — BASIC METABOLIC PANEL
Anion gap: 9 (ref 5–15)
BUN: 21 mg/dL (ref 8–23)
CO2: 24 mmol/L (ref 22–32)
Calcium: 8.5 mg/dL — ABNORMAL LOW (ref 8.9–10.3)
Chloride: 106 mmol/L (ref 98–111)
Creatinine, Ser: 0.86 mg/dL (ref 0.61–1.24)
GFR calc Af Amer: >60 mL/min (ref >60)
GFR calc non Af Amer: >60 mL/min (ref >60)
Glucose, Bld: 106 mg/dL — ABNORMAL HIGH (ref 70–99)
Potassium: 4.3 mmol/L (ref 3.5–5.1)
Sodium: 139 mmol/L (ref 135–145)

## 2019-01-21 LAB — GLUCOSE, CAPILLARY
Glucose-Capillary: 101 mg/dL — ABNORMAL HIGH (ref 70–99)
Glucose-Capillary: 104 mg/dL — ABNORMAL HIGH (ref 70–99)
Glucose-Capillary: 141 mg/dL — ABNORMAL HIGH (ref 70–99)
Glucose-Capillary: 89 mg/dL (ref 70–99)
Glucose-Capillary: 98 mg/dL (ref 70–99)

## 2019-01-21 MED ORDER — RISPERIDONE 1 MG/ML PO SOLN
0.5000 mg | Freq: Two times a day (BID) | ORAL | Status: DC
  Administered 2019-01-21 – 2019-01-23 (×5): 0.5 mg
  Filled 2019-01-21 (×6): qty 0.5

## 2019-01-21 MED ORDER — ETOMIDATE 2 MG/ML IV SOLN
INTRAVENOUS | Status: AC
  Filled 2019-01-21: qty 10

## 2019-01-21 MED ORDER — ROCURONIUM BROMIDE 50 MG/5ML IV SOLN
40.0000 mg | Freq: Once | INTRAVENOUS | Status: AC
  Administered 2019-01-21: 40 mg via INTRAVENOUS
  Filled 2019-01-21: qty 4

## 2019-01-21 MED ORDER — CHLORHEXIDINE GLUCONATE CLOTH 2 % EX PADS
6.0000 | MEDICATED_PAD | Freq: Every day | CUTANEOUS | Status: DC
  Administered 2019-01-22 – 2019-02-05 (×14): 6 via TOPICAL

## 2019-01-21 MED ORDER — ETOMIDATE 2 MG/ML IV SOLN
20.0000 mg | Freq: Once | INTRAVENOUS | Status: AC
  Administered 2019-01-21: 20 mg via INTRAVENOUS

## 2019-01-21 MED ORDER — ROCURONIUM BROMIDE 10 MG/ML (PF) SYRINGE
PREFILLED_SYRINGE | INTRAVENOUS | Status: AC
  Filled 2019-01-21: qty 10

## 2019-01-21 MED ORDER — METOPROLOL TARTRATE 5 MG/5ML IV SOLN
2.5000 mg | INTRAVENOUS | Status: DC | PRN
  Administered 2019-01-21 – 2019-01-25 (×5): 2.5 mg via INTRAVENOUS
  Filled 2019-01-21 (×7): qty 5

## 2019-01-21 NOTE — Progress Notes (Addendum)
NAME:  [name], MRN:  [id number], DOB:  [birth year], LOS: 3 ADMISSION DATE:  01/18/2019, CONSULTATION DATE:  01/18/19 REFERRING MD:  Leonel Ramsay, CHIEF COMPLAINT:  Unable to elicit   Brief History   54yM with hypertensive emergency in setting of acute L MCA stroke s/p tPA, AF/RVR  Has been noncompliant with his warfarin for A. fib. Code stroke activated, TPA administered Intubated for respiratory failure.  PCCM consulted for vent management  Past Medical History  AF COPD CVA PAD  Significant Hospital Events   12/10 tPA administered at 1946 12/11-intubated  Consults:  PCCM  Procedures:  none  Significant Diagnostic Tests:  CTA h/n: distal MCA branches that lose opacification and there could be a few small emboli in the left MCA territory  Micro Data:  none  Antimicrobials:  none  Interim history/subjective:  Continues to be very agitated Failed weaning this morning Had to be resedated Endotracheal tube malfunction  Objective   Blood pressure (!) 122/103, pulse 98, temperature (!) 101.7 F (38.7 C), temperature source Axillary, resp. rate (!) 22, weight 72.3 kg, SpO2 94 %.    Vent Mode: PRVC FiO2 (%):  [40 %] 40 % Set Rate:  [12 bmp] 12 bmp Vt Set:  [540 mL] 540 mL PEEP:  [5 cmH20] 5 cmH20 Pressure Support:  [10 cmH20] 10 cmH20 Plateau Pressure:  [14 cmH20] 14 cmH20   Intake/Output Summary (Last 24 hours) at 01/21/2019 [id number] Last data filed at 01/21/2019 0700 Gross per 24 hour  Intake 2917.4 ml  Output 965 ml  Net 1952.4 ml   Filed Weights   01/18/19 1900 01/20/19 0500 01/21/19 0414  Weight: 80 kg 72.3 kg 72.3 kg    Examination: Gen:      Chronically ill-appearing gentleman HEENT: Anicteric, pupils reacting Neck:     No masses, no thyromegaly Lungs:    Clear breath sounds CV:         S1-S2 appreciated Abd:      Bowel sounds appreciated Ext:    No edema, no clubbing Skin:      Warm and dry; no rash Neuro: Sedated,  unresponsive  Resolved Hospital Problem list     Assessment & Plan:  Acute stroke with distal branch occlusions of left MCA Status post TPA Management per neurology  Acute respiratory failure due to CVA Continue full vent support Pressure support wean as tolerated -Did fail weaning this morning -Heart rate shot up to the 180s, respiratory rate in the 40s -Trashing around Follow chest x-ray, follow ABG -He is not hemodynamically stable to be extubated at present -Keep him sedated  Agitation -Add risperidone 0.5 twice daily  Hypertensive emergency Atrial fibrillation with RVR -Continue monitor -Was on Cardizem -Add Lopressor  Chronic benzodiazepine use -Remains on Precedex   Patient reintubated this morning -Required etomidate 20 and 40 mg Norcuron  Best practice:  Diet: N.p.o. Pain/Anxiety/Delirium protocol (if indicated): Precedex, fentanyl VAP protocol (if indicated): In place DVT prophylaxis: SCDs GI prophylaxis: Start Pepcid Glucose control: q6 checks Mobility: Bedrest Code Status: Full code Family Communication: Per primary Disposition:  ICU  Labs   CBC: Recent Labs  Lab 01/18/19 1923 01/19/19 0437 01/19/19 1054 01/20/19 0555 01/20/19 1022 01/21/19 0349  WBC 11.1* 11.7*  --   --  11.0* 14.0*  NEUTROABS 6.8  --   --   --   --   --   HGB 15.1 14.0 12.9* 14.3 13.9 13.3  HCT 43.7 40.5 38.0* 42.0 42.1 40.0  MCV 103.3* 102.5*  --   --  106.0* 106.7*  PLT 244 238  --   --  193 [id number]    Basic Metabolic Panel: Recent Labs  Lab 01/18/19 1923 01/18/19 1928 01/19/19 0059 01/19/19 1054 01/19/19 1132 01/19/19 1706 01/20/19 0555 01/20/19 0713 01/20/19 1700 01/21/19 0349  NA 139 139 138 139  --   --  136 139  --  139  K 3.3* 3.4* 3.3* 3.1*  --   --  3.7 4.0  --  4.3  CL 106 104  --   --   --   --   --  108  --  106  CO2 22  --   --   --   --   --   --  22  --  24  GLUCOSE 107* 106*  --   --   --   --   --  89  --  106*  BUN 11 13  --   --   --    --   --  16  --  21  CREATININE 0.94 0.80  --   --   --   --   --  0.93  --  0.86  CALCIUM 9.5  --   --   --   --   --   --  8.7*  --  8.5*  MG  --   --   --   --  1.6* 1.7  --  1.7 1.7  --   PHOS  --   --   --   --  3.2 3.8  --  3.1 2.5  --    GFR: CrCl cannot be calculated (Unknown ideal weight.). Recent Labs  Lab 01/18/19 1923 01/18/19 1951 01/18/19 2339 01/19/19 0437 01/20/19 1022 01/21/19 0349  WBC 11.1*  --   --  11.7* 11.0* 14.0*  LATICACIDVEN  --  2.8* 4.4*  --   --   --     Liver Function Tests: Recent Labs  Lab 01/18/19 1923  AST 22  ALT 23  ALKPHOS 61  BILITOT 1.7*  PROT 6.5  ALBUMIN 3.7   No results for input(s): LIPASE, AMYLASE in the last 168 hours. No results for input(s): AMMONIA in the last 168 hours.  ABG    Component Value Date/Time   PHART 7.357 01/20/2019 0555   PCO2ART 37.6 01/20/2019 0555   PO2ART 62.0 (L) 01/20/2019 0555   HCO3 21.1 01/20/2019 0555   TCO2 22 01/20/2019 0555   ACIDBASEDEF 4.0 (H) 01/20/2019 0555   O2SAT 91.0 01/20/2019 0555     Coagulation Profile: Recent Labs  Lab 01/18/19 1923  INR 1.1    Cardiac Enzymes: No results for input(s): CKTOTAL, CKMB, CKMBINDEX, TROPONINI in the last 168 hours.  HbA1C: Hgb A1c MFr Bld  Date/Time Value Ref Range Status  01/19/2019 04:37 AM 5.1 4.8 - 5.6 % Final    Comment:    (NOTE) Pre diabetes:          5.7%-6.4% Diabetes:              >6.4% Glycemic control for   <7.0% adults with diabetes   11/25/2014 01:34 PM 5.4 4.8 - 5.6 % Final    Comment:    (NOTE)         Pre-diabetes: 5.7 - 6.4         Diabetes: >6.4         Glycemic control for adults with diabetes: <7.0    CBG: Recent Labs  Lab 01/20/19  1614 01/20/19 2015 01/21/19 0000 01/21/19 0343 01/21/19 0753  GLUCAP 102* 92 104* 101* 89   The patient is critically ill with multiple organ systems failure and requires high complexity decision making for assessment and support, frequent evaluation and titration of  therapies, application of advanced monitoring technologies and extensive interpretation of multiple databases. Critical Care Time devoted to patient care services described in this note independent of APP/resident time (if applicable)  is 30 minutes.   Sherrilyn Rist MD [facility] Personal pager: [phone] If unanswered, please page CCM On-call: [phone]

## 2019-01-21 NOTE — Procedures (Addendum)
Intubation Procedure Note [name] [id number] [birth year]  Procedure: Intubation Indications: ETT needed to be changed  Patient was given 20 mg etomidate 40 mg of rocuronium  Procedure Details Consent: Unable to obtain consent because of emergent medical necessity. Time Out: Verified patient identification, verified procedure, site/side was marked, verified correct patient position, special equipment/implants available, medications/allergies/relevent history reviewed, required imaging and test results available.  Performed  Maximum sterile technique was used including gloves, hand hygiene and mask.  3    Evaluation Hemodynamic Status: BP stable throughout; O2 sats: stable throughout Patient's Current Condition: stable Complications: No apparent complications Patient did tolerate procedure well. Chest X-ray ordered to verify placement.  CXR: pending.   [name] [name] 01/21/2019

## 2019-01-21 NOTE — Progress Notes (Signed)
STROKE TEAM PROGRESS NOTE   INTERVAL HISTORY Patient RN at bedside.  Patient still intubated, on vent, not able to extubate due to agitation if tapering off sedation. Currently, he was put on precedex, fentanyl and versed and also received neuromuscular blockage. He was unresponsive at this time.     OBJECTIVE Vitals:   01/21/19 1115 01/21/19 1130 01/21/19 1145 01/21/19 1200  BP: 108/78 108/72 106/77 101/74  Pulse: (!) 56 83 92 93  Resp: 19 20 18  (!) 21  Temp:      TempSrc:      SpO2: 99% 100% 100% 98%  Weight:        CBC:  Recent Labs  Lab 01/18/19 1923 01/18/19 1928 01/20/19 1022 01/21/19 0349  WBC 11.1*   < > 11.0* 14.0*  NEUTROABS 6.8  --   --   --   HGB 15.1  --  13.9 13.3  HCT 43.7  --  42.1 40.0  MCV 103.3*   < > 106.0* 106.7*  PLT 244   < > 193 177   < > = values in this interval not displayed.    Basic Metabolic Panel:  Recent Labs  Lab 01/20/19 0713 01/20/19 1700 01/21/19 0349  NA 139  --  139  K 4.0  --  4.3  CL 108  --  106  CO2 22  --  24  GLUCOSE 89  --  106*  BUN 16  --  21  CREATININE 0.93  --  0.86  CALCIUM 8.7*  --  8.5*  MG 1.7 1.7  --   PHOS 3.1 2.5  --     Lipid Panel:     Component Value Date/Time   CHOL 238 (H) 01/19/2019 0437   TRIG 64 01/19/2019 0437   HDL 43 01/19/2019 0437   CHOLHDL 5.5 01/19/2019 0437   VLDL 13 01/19/2019 0437   LDLCALC 182 (H) 01/19/2019 0437   HgbA1c:  Lab Results  Component Value Date   HGBA1C 5.1 01/19/2019   Urine Drug Screen:     Component Value Date/Time   LABOPIA NONE DETECTED 07/13/2016 1428   COCAINSCRNUR NONE DETECTED 07/13/2016 1428   LABBENZ POSITIVE (A) 07/13/2016 1428   AMPHETMU NONE DETECTED 07/13/2016 1428   THCU POSITIVE (A) 07/13/2016 1428   LABBARB NONE DETECTED 07/13/2016 1428    Alcohol Level     Component Value Date/Time   ETH <5 02/09/2015 0010    IMAGING  CT Code Stroke CTA Head W/WO contrast CT Code Stroke CTA Neck W/WO contrast 01/18/2019 IMPRESSION:    Atherosclerotic disease at both carotid bifurcations but no stenosis.   Prominent soft plaque at the left ICA bulb.   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   Dominant right vertebral artery widely patent.   No intracranial large or medium vessel occlusion or correctable proximal stenosis, with specific attention to the left MCA branches.   I do think there are a few distal MCA branches that lose opacification and there could be a few small emboli in the left MCA territory. Vessels marked with arrows.   Distal vessel atherosclerotic irregularity, particularly evident in the PCA branches.   Aortic Atherosclerosis (ICD10-I70.0)   Emphysema (ICD10-J43.9).   CT HEAD CODE STROKE WO CONTRAST 01/18/2019  IMPRESSION: 1. No acute finding by CT. Chronic small-vessel ischemic changes of the white matter. Old right cerebellar and right occipital infarctions.  2. ASPECTS is 10   MRI Wo  Contrast  01/20/2019 IMPRESSION: 1. Multifocal acute ischemia within the left MCA territory, including the left postcentral gyrus. No hemorrhage or mass effect. 2. 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 3. Chronic ischemic microangiopathy.  CT Head Wo Contrast  01/19/2019 IMPRESSION: Interval demarcation of an acute cortically based infarct within the lateral left frontal lobe as described. No acute intracranial hemorrhage or significant mass effect.   Transthoracic Echocardiogram  01/19/2019 IMPRESSIONS  1. Left ventricular ejection fraction, by visual estimation, is 55 to 60%. The left ventricle has normal function. There is moderately increased left ventricular hypertrophy.  2. The left ventricle demonstrates regional wall motion abnormalities.  3. Global right ventricle has normal systolic function.The right ventricular size is normal. No increase in right ventricular wall thickness.  4. Left atrial size was normal.  5.  Right atrial size was mildly dilated.  6. The mitral valve is normal in structure. Mild to moderate mitral valve regurgitation.  7. The tricuspid valve is normal in structure. Tricuspid valve regurgitation is trivial.  8. The aortic valve is normal in structure. Aortic valve regurgitation is not visualized.  9. The pulmonic valve was normal in structure. Pulmonic valve regurgitation is not visualized. 10. The atrial septum is grossly normal.  ECG atrial fibrillation - ventricular response 133 BPM (See cardiology reading for complete details)  PHYSICAL EXAM Blood pressure 101/74, pulse 93, temperature (!) 101.7 F (38.7 C), temperature source Axillary, resp. rate (!) 21, weight 72.3 kg, SpO2 98 %.  General - Well nourished, well developed, intubated on sedation.  Ophthalmologic - fundi not visualized due to noncooperation.  Cardiovascular - irregularly irregular heart rate and rhythm.  Neuro - intubated on sedation, not responsive due to deep sedation and neuromuscular blockade, eyes  closed, not following commands. With forced eye opening, eyes in mid position, not blinking to visual threat on the right, not tracking, pupil bilaterally 2 mm, sluggish to light. Corneal reflex  absent, gag and cough  absent.  Not breathing over the vent.  Facial symmetry not able to test due to ET tube. On pain stimulation, no movement of all extremities. Sensation, coordination and gait not tested.    ASSESSMENT/PLAN [name] is a 68 y.o. male with history of tobacco use, previous strokes, COPD, AAA, ASPVD, htn, hld, pulmonary nodule, depression, atrial fibrillation on coumadin but recently missed doses presenting with transient aphasia and leaning to the right. The patient received IV t-PA Thursday 01/18/19 @ 2000.  Stroke - left MCA infarct s/p t-PA - embolic pattern, likely due to atrial fibrillation with subtherapeutic INR  Resultant left gaze, right hemiparesis, right neglect and  agitation  Code Stroke CT Head - No acute finding by CT. Chronic small-vessel ischemic changes of the white matter. Old right cerebellar and right occipital infarctions. ASPECTS is 10   CT head - Interval demarcation of an acute cortically based infarct within the lateral left frontal lobe as described. No acute intracranial hemorrhage or significant mass effect.  MRI head -  Multifocal acute ischemia within the left MCA territory, including the left postcentral gyrus. No hemorrhage or mass effect. 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 Chronic ischemic microangiopathy.  CTA H&N - Prominent soft plaque at the left ICA bulb. 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   2D Echo - EF 55 to 60%. No cardiac source of emboli identified.   Sars Corona Virus 2 - negative  LDL - 182  HgbA1c -  5.1  VTE prophylaxis -Heparin subq  warfarin daily prior to admission, now on ASA 325 mg daily given moderate sized infarct.  Will consider DOAC in 5 to 7 days  Ongoing aggressive stroke risk factor management  Therapy recommendations:  pending  Disposition:  Pending  Afib with RVR  On amiodarone and Cardizem drip  Still has RVR due to agitation  On warfarin PTA, INR 1.1 on admission  On aspirin for now  We will consider DOAC in 5 to 7 days given moderate size infarct  AMS with agitation  Respiratory failure with hypoxia  Intubated on sedation  CCM on board  Not candidate for extubation today  B12 deficiency  Vitamin B12 = 161  B12 supplement  History of stroke  11/2014, bilateral blurry vision after left upper extremity bypass surgery.  Found to have left central vision loss.  MRI showed bilateral embolic infarcts.  INR 1.45.  Carotid Doppler negative.  TTE unremarkable.  LDL 82, A1c 5.4.  Continued on discharge with Coumadin and Lipitor 40  hypotension Hx of hypertension  Home BP meds: Diltiazem, Lasix  and metoprolol  Current BP meds none  Now on neo . Avoid low BP  Hyperlipidemia  Home Lipid lowering medication: Lipitor 40 mg daily  LDL 182, goal < 70  Current lipid lowering medication: Lipitor 80 mg daily   Continue statin at discharge  Dysphagia  N.p.o.  Speech to follow once extubated  P.o. meds through tube  On bolus feed  Fever and leukocytosis  T-max 101.7  leucocytosis - 11.1->11.7->11.0->14.0  CXR - Increased opacities at the lung bases with evidence of mild edema, atelectasis and/or consolidation with small right pleural effusion.  UA neg 01/18/19  CCM on board  Tobacco abuse  Current smoker  Smoking cessation counseling will be provided  Pt is willing to quit  Other Stroke Risk Factors  Advanced age  ETOH use, advised to drink no more than 1 alcoholic beverage per day.  Other Active Problems  Hypokalemia 3.3->3.4->3.3->3.1->4.0->4.3   Hospital day # 3  This patient is critically ill and at significant risk of neurological worsening, death and care requires constant monitoring of vital signs, hemodynamics,respiratory and cardiac monitoring, extensive review of multiple databases, frequent neurological assessment, discussion with family, other specialists and medical decision making of high complexity. I spent 35 minutes of neurocritical care time  in the care of  this patient.  Rosalin Hawking, MD PhD Stroke Neurology 01/21/2019 5:36 PM  To contact Stroke Continuity provider, please refer to [email or web address] After hours, contact General Neurology

## 2019-01-21 NOTE — Progress Notes (Signed)
Patient tube exchanged with MD, RT and RN at bedside. MD had glideoscope and bougie at bedside. Tube was exchanged with no complications. New ETT tube is now at 26 at the lip and secured with tube holder.

## 2019-01-21 NOTE — Progress Notes (Signed)
Patient was on the weaning trial for roughly 15 minutes and during this time patient became very agitated and thrashing around. Sedation was decreased however HR in the 180s and RR in the 40s. Patient continues to thrash head back and forth popping off the ventilator multiple times. Sedation increased and ventilator turned back to [facility].

## 2019-01-22 DIAGNOSIS — G9340 Encephalopathy, unspecified: Secondary | ICD-10-CM

## 2019-01-22 DIAGNOSIS — I952 Hypotension due to drugs: Secondary | ICD-10-CM

## 2019-01-22 DIAGNOSIS — I10 Essential (primary) hypertension: Secondary | ICD-10-CM

## 2019-01-22 LAB — BASIC METABOLIC PANEL
Anion gap: 8 (ref 5–15)
BUN: 29 mg/dL — ABNORMAL HIGH (ref 8–23)
CO2: 23 mmol/L (ref 22–32)
Calcium: 8.5 mg/dL — ABNORMAL LOW (ref 8.9–10.3)
Chloride: 108 mmol/L (ref 98–111)
Creatinine, Ser: 0.92 mg/dL (ref 0.61–1.24)
GFR calc Af Amer: >60 mL/min (ref >60)
GFR calc non Af Amer: >60 mL/min (ref >60)
Glucose, Bld: 109 mg/dL — ABNORMAL HIGH (ref 70–99)
Potassium: 4.4 mmol/L (ref 3.5–5.1)
Sodium: 139 mmol/L (ref 135–145)

## 2019-01-22 LAB — GLUCOSE, CAPILLARY
Glucose-Capillary: 92 mg/dL (ref 70–99)
Glucose-Capillary: 123 mg/dL — ABNORMAL HIGH (ref 70–99)
Glucose-Capillary: 140 mg/dL — ABNORMAL HIGH (ref 70–99)
Glucose-Capillary: 89 mg/dL (ref 70–99)

## 2019-01-22 LAB — CBC
HCT: 38.7 % — ABNORMAL LOW (ref 39.0–52.0)
Hemoglobin: 13 g/dL (ref 13.0–17.0)
MCH: 35.5 pg — ABNORMAL HIGH (ref 26.0–34.0)
MCHC: 33.6 g/dL (ref 30.0–36.0)
MCV: 105.7 fL — ABNORMAL HIGH (ref 80.0–100.0)
Platelets: 165 10*3/uL (ref 150–400)
RBC: 3.66 MIL/uL — ABNORMAL LOW (ref 4.22–5.81)
RDW: 14.6 % (ref 11.5–15.5)
WBC: 13.4 10*3/uL — ABNORMAL HIGH (ref 4.0–10.5)
nRBC: 0 % (ref 0.0–0.2)

## 2019-01-22 MED ORDER — DEXMEDETOMIDINE HCL IN NACL 400 MCG/100ML IV SOLN
0.4000 ug/kg/h | INTRAVENOUS | Status: DC
  Administered 2019-01-22: 0.4 ug/kg/h via INTRAVENOUS
  Administered 2019-01-22: 1 ug/kg/h via INTRAVENOUS
  Administered 2019-01-23: 11:00:00 2 ug/kg/h via INTRAVENOUS
  Administered 2019-01-23: 1 ug/kg/h via INTRAVENOUS
  Administered 2019-01-23 (×3): 2 ug/kg/h via INTRAVENOUS
  Administered 2019-01-23: 1.4 ug/kg/h via INTRAVENOUS
  Administered 2019-01-23 – 2019-01-24 (×5): 2 ug/kg/h via INTRAVENOUS
  Filled 2019-01-22 (×6): qty 100
  Filled 2019-01-22: qty 200
  Filled 2019-01-22 (×6): qty 100

## 2019-01-22 NOTE — Progress Notes (Signed)
OT Cancellation Note  Patient Details Name: [name] MRN: [id number] DOB: [birth year]   Cancelled Treatment:    Reason Eval/Treat Not Completed: Patient not medically ready. Pt vented and sedated.  [facility]  OTR/L Acute [employer] Pager [phone] Office [phone]     01/22/2019, 10:53 AM

## 2019-01-22 NOTE — Progress Notes (Signed)
NAME:  [name], MRN:  [id number], DOB:  03/07/1950, LOS: 4 ADMISSION DATE:  01/18/2019, CONSULTATION DATE:  01/18/19 REFERRING MD:  Leonel Ramsay, CHIEF COMPLAINT:  Unable to elicit   Brief History   11yM with hypertensive emergency in setting of acute L MCA stroke s/p tPA, AF/RVR  Has been noncompliant with his warfarin for A. fib. Code stroke activated, TPA administered Intubated for respiratory failure.  PCCM consulted for vent management  Past Medical History  AF COPD CVA PAD  Significant Hospital Events   12/10 tPA administered at 1946 12/11-intubated  Consults:  PCCM  Procedures:  none  Significant Diagnostic Tests:  CTA h/n: distal MCA branches that lose opacification and there could be a few small emboli in the left MCA territory  Micro Data:  none  Antimicrobials:  none  Interim history/subjective:  Extreme agitation overnight requiring heavy sedation Weaning ok this AM  Objective   Blood pressure 107/80, pulse 97, temperature 99.3 F (37.4 C), temperature source Axillary, resp. rate 18, weight 72.5 kg, SpO2 100 %.    Vent Mode: PSV;CPAP FiO2 (%):  [30 %-40 %] 40 % Set Rate:  [12 bmp] 12 bmp Vt Set:  [540 mL] 540 mL PEEP:  [5 cmH20] 5 cmH20 Pressure Support:  [8 cmH20] 8 cmH20 Plateau Pressure:  [12 cmH20-17 cmH20] 15 cmH20   Intake/Output Summary (Last 24 hours) at 01/22/2019 [id number] Last data filed at 01/22/2019 0700 Gross per 24 hour  Intake 3623.68 ml  Output 875 ml  Net 2748.68 ml   Filed Weights   01/20/19 0500 01/21/19 0414 01/22/19 0438  Weight: 72.3 kg 72.3 kg 72.5 kg    Examination: Gen:      Acute on chronically ill appearing male, NAD HEENT: [place]/AT, bruise over the right eye, PERRL, EOM-I and MMM, ETT in place Neck:     WNL Lungs:    CTA bilaterally CV:         RRR, Nl S1/S2 and -M/R/G Abd:      Soft, NT, ND and +BS Ext:    No edema, no clubbing Skin:      Warm and dry; no rash Neuro: Sedated, unresponsive  I  reviewed CXR myself, ETT is in a good position, no acute disease noted  Resolved Hospital Problem list     Assessment & Plan:  Acute stroke with distal branch occlusions of left MCA Status post TPA Management per neurology  Acute respiratory failure due to CVA Continue full vent support Pressure support wean as tolerated -D/C sedation and extubate -Monitor closely for airway protection -Monitor with agitation -CXR and ABG to PRN at this point -Attempt to d/c neo once off sedation  Agitation -Continue risperidone 0.5 twice daily  Hypertensive emergency Atrial fibrillation with RVR -Continue monitor -Was on Cardizem -Continue Lopressor  Chronic benzodiazepine use -Remains on Precedex  Hold in the ICU given high risk of reintubation and alcohol withdrawal  Best practice:  Diet: N.p.o. Pain/Anxiety/Delirium protocol (if indicated): Precedex, fentanyl VAP protocol (if indicated): In place DVT prophylaxis: SCDs GI prophylaxis: Start Pepcid Glucose control: q6 checks Mobility: Bedrest Code Status: Full code Family Communication: Per primary Disposition:  ICU  Labs   CBC: Recent Labs  Lab 01/18/19 1923 01/19/19 0437 01/19/19 1054 01/20/19 0555 01/20/19 1022 01/21/19 0349 01/22/19 0324  WBC 11.1* 11.7*  --   --  11.0* 14.0* 13.4*  NEUTROABS 6.8  --   --   --   --   --   --  HGB 15.1 14.0 12.9* 14.3 13.9 13.3 13.0  HCT 43.7 40.5 38.0* 42.0 42.1 40.0 38.7*  MCV 103.3* 102.5*  --   --  106.0* 106.7* 105.7*  PLT 244 238  --   --  193 177 [id number]    Basic Metabolic Panel: Recent Labs  Lab 01/18/19 1923 01/18/19 1928 01/19/19 1054 01/19/19 1132 01/19/19 1706 01/20/19 0555 01/20/19 0713 01/20/19 1700 01/21/19 0349 01/22/19 0324  NA 139 139 139  --   --  136 139  --  139 139  K 3.3* 3.4* 3.1*  --   --  3.7 4.0  --  4.3 4.4  CL 106 104  --   --   --   --  108  --  106 108  CO2 22  --   --   --   --   --  22  --  24 23  GLUCOSE 107* 106*  --   --   --   --   89  --  106* 109*  BUN 11 13  --   --   --   --  16  --  21 29*  CREATININE 0.94 0.80  --   --   --   --  0.93  --  0.86 0.92  CALCIUM 9.5  --   --   --   --   --  8.7*  --  8.5* 8.5*  MG  --   --   --  1.6* 1.7  --  1.7 1.7  --   --   PHOS  --   --   --  3.2 3.8  --  3.1 2.5  --   --    GFR: CrCl cannot be calculated (Unknown ideal weight.). Recent Labs  Lab 01/18/19 1951 01/18/19 2339 01/19/19 0437 01/20/19 1022 01/21/19 0349 01/22/19 0324  WBC  --   --  11.7* 11.0* 14.0* 13.4*  LATICACIDVEN 2.8* 4.4*  --   --   --   --     Liver Function Tests: Recent Labs  Lab 01/18/19 1923  AST 22  ALT 23  ALKPHOS 61  BILITOT 1.7*  PROT 6.5  ALBUMIN 3.7   No results for input(s): LIPASE, AMYLASE in the last 168 hours. No results for input(s): AMMONIA in the last 168 hours.  ABG    Component Value Date/Time   PHART 7.357 01/20/2019 0555   PCO2ART 37.6 01/20/2019 0555   PO2ART 62.0 (L) 01/20/2019 0555   HCO3 21.1 01/20/2019 0555   TCO2 22 01/20/2019 0555   ACIDBASEDEF 4.0 (H) 01/20/2019 0555   O2SAT 91.0 01/20/2019 0555     Coagulation Profile: Recent Labs  Lab 01/18/19 1923  INR 1.1    Cardiac Enzymes: No results for input(s): CKTOTAL, CKMB, CKMBINDEX, TROPONINI in the last 168 hours.  HbA1C: Hgb A1c MFr Bld  Date/Time Value Ref Range Status  01/19/2019 04:37 AM 5.1 4.8 - 5.6 % Final    Comment:    (NOTE) Pre diabetes:          5.7%-6.4% Diabetes:              >6.4% Glycemic control for   <7.0% adults with diabetes   11/25/2014 01:34 PM 5.4 4.8 - 5.6 % Final    Comment:    (NOTE)         Pre-diabetes: 5.7 - 6.4         Diabetes: >6.4  Glycemic control for adults with diabetes: <7.0    CBG: Recent Labs  Lab 01/21/19 0343 01/21/19 0753 01/21/19 1146 01/21/19 1542 01/22/19 0752  GLUCAP 101* 89 98 141* 92   The patient is critically ill with multiple organ systems failure and requires high complexity decision making for assessment and  support, frequent evaluation and titration of therapies, application of advanced monitoring technologies and extensive interpretation of multiple databases.   Critical Care Time devoted to patient care services described in this note is  33  Minutes. This time reflects time of care of this signee Dr [name]. This critical care time does not reflect procedure time, or teaching time or supervisory time of PA/NP/Med student/Med Resident etc but could involve care discussion time.  Rush Farmer, M.D. [facility]monary/Critical Care Medicine.

## 2019-01-22 NOTE — Progress Notes (Signed)
PT Cancellation Note  Patient Details Name: [name] MRN: [id number] DOB: Jun 01, 1950   Cancelled Treatment:    Reason Eval/Treat Not Completed: Patient not medically ready. Pt vented and sedated.    Shary Decamp [facility] 01/22/2019, 9:29 AM Aireana Ryland [place] Pager [phone] Office [phone]

## 2019-01-22 NOTE — Progress Notes (Signed)
SLP Cancellation Note  Patient Details Name: [name] MRN: [id number] DOB: 25-Apr-1950   Cancelled treatment:       Reason Eval/Treat Not Completed: Medical issues which prohibited therapy (pt remains on vent). Will f/u as able.   [name] [name] 01/22/2019, 7:27 AM  [name], M.A. [place] Acute Environmental education officer [phone] Office [phone]

## 2019-01-22 NOTE — Progress Notes (Signed)
STROKE TEAM PROGRESS NOTE   INTERVAL HISTORY Pt still intubated on sedation. Still in afib RVR. However, discussed with Dr. Nelda Marseille and possible extubation today.       OBJECTIVE Vitals:   01/22/19 0630 01/22/19 0645 01/22/19 0700 01/22/19 0800  BP: 110/79 107/80 105/79 107/80  Pulse: 96 89 83 97  Resp: 16 16 16 18   Temp:    99.3 F (37.4 C)  TempSrc:    Axillary  SpO2: 99% 98% 98% 100%  Weight:        CBC:  Recent Labs  Lab 01/18/19 1923 01/18/19 1928 01/21/19 0349 01/22/19 0324  WBC 11.1*   < > 14.0* 13.4*  NEUTROABS 6.8  --   --   --   HGB 15.1  --  13.3 13.0  HCT 43.7  --  40.0 38.7*  MCV 103.3*   < > 106.7* 105.7*  PLT 244   < > 177 165   < > = values in this interval not displayed.    Basic Metabolic Panel:  Recent Labs  Lab 01/20/19 0713 01/20/19 1700 01/21/19 0349 01/22/19 0324  NA 139  --  139 139  K 4.0  --  4.3 4.4  CL 108  --  106 108  CO2 22  --  24 23  GLUCOSE 89  --  106* 109*  BUN 16  --  21 29*  CREATININE 0.93  --  0.86 0.92  CALCIUM 8.7*  --  8.5* 8.5*  MG 1.7 1.7  --   --   PHOS 3.1 2.5  --   --     Lipid Panel:     Component Value Date/Time   CHOL 238 (H) 01/19/2019 0437   TRIG 64 01/19/2019 0437   HDL 43 01/19/2019 0437   CHOLHDL 5.5 01/19/2019 0437   VLDL 13 01/19/2019 0437   LDLCALC 182 (H) 01/19/2019 0437   HgbA1c:  Lab Results  Component Value Date   HGBA1C 5.1 01/19/2019   Urine Drug Screen:     Component Value Date/Time   LABOPIA NONE DETECTED 07/13/2016 1428   COCAINSCRNUR NONE DETECTED 07/13/2016 1428   LABBENZ POSITIVE (A) 07/13/2016 1428   AMPHETMU NONE DETECTED 07/13/2016 1428   THCU POSITIVE (A) 07/13/2016 1428   LABBARB NONE DETECTED 07/13/2016 1428    Alcohol Level     Component Value Date/Time   ETH <5 02/09/2015 0010    IMAGING  CT Code Stroke CTA Head W/WO contrast CT Code Stroke CTA Neck W/WO contrast 01/18/2019 IMPRESSION:   Atherosclerotic disease at both carotid bifurcations but no  stenosis.   Prominent soft plaque at the left ICA bulb.   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   Dominant right vertebral artery widely patent.   No intracranial large or medium vessel occlusion or correctable proximal stenosis, with specific attention to the left MCA branches.   I do think there are a few distal MCA branches that lose opacification and there could be a few small emboli in the left MCA territory. Vessels marked with arrows.   Distal vessel atherosclerotic irregularity, particularly evident in the PCA branches.   Aortic Atherosclerosis (ICD10-I70.0)   Emphysema (ICD10-J43.9).   CT HEAD CODE STROKE WO CONTRAST 01/18/2019  IMPRESSION: 1. No acute finding by CT. Chronic small-vessel ischemic changes of the white matter. Old right cerebellar and right occipital infarctions.  2. ASPECTS is 10   MRI Wo Contrast  01/20/2019 IMPRESSION:  1. Multifocal acute ischemia within the left MCA territory, including the left postcentral gyrus. No hemorrhage or mass effect. 2. 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 3. Chronic ischemic microangiopathy.  CT Head Wo Contrast  01/19/2019 IMPRESSION: Interval demarcation of an acute cortically based infarct within the lateral left frontal lobe as described. No acute intracranial hemorrhage or significant mass effect.  Transthoracic Echocardiogram  01/19/2019 IMPRESSIONS  1. Left ventricular ejection fraction, by visual estimation, is 55 to 60%. The left ventricle has normal function. There is moderately increased left ventricular hypertrophy.  2. The left ventricle demonstrates regional wall motion abnormalities.  3. Global right ventricle has normal systolic function.The right ventricular size is normal. No increase in right ventricular wall thickness.  4. Left atrial size was normal.  5. Right atrial size was mildly dilated.  6. The mitral valve is  normal in structure. Mild to moderate mitral valve regurgitation.  7. The tricuspid valve is normal in structure. Tricuspid valve regurgitation is trivial.  8. The aortic valve is normal in structure. Aortic valve regurgitation is not visualized.  9. The pulmonic valve was normal in structure. Pulmonic valve regurgitation is not visualized. 10. The atrial septum is grossly normal.  ECG atrial fibrillation - ventricular response 133 BPM (See cardiology reading for complete details)  PHYSICAL EXAM   Blood pressure 107/80, pulse 97, temperature 99.3 F (37.4 C), temperature source Axillary, resp. rate 18, weight 72.5 kg, SpO2 100 %.  General - Well nourished, well developed, intubated on sedation.  Ophthalmologic - fundi not visualized due to noncooperation.  Cardiovascular - irregularly irregular heart rate and rhythm, afib RVR on tele.  Neuro - intubated on sedation, eyes  closed, not following commands. With forced eye opening, eyes in mid position, not blinking to visual threat bilaterally, not tracking, pupil bilaterally 2 mm, sluggish to light. Corneal reflex  present bilaterally, gag and cough present.  Breathing over the vent.  Facial symmetry not able to test due to ET tube. On pain stimulation, slight withdraw movement of all extremities. Sensation, coordination and gait not tested.    ASSESSMENT/PLAN [name] is a 68 y.o. male with history of tobacco use, previous strokes, COPD, AAA, ASPVD, htn, hld, pulmonary nodule, depression, atrial fibrillation on coumadin but recently missed doses presenting with transient aphasia and leaning to the right. The patient received IV t-PA Thursday 01/18/19 @ 2000.  Stroke - left MCA infarct s/p t-PA - embolic pattern, likely due to atrial fibrillation with subtherapeutic INR  Resultant left gaze, right hemiparesis, right neglect and agitation  Code Stroke CT Head - No acute finding by CT. Chronic small-vessel ischemic changes of  the white matter. Old right cerebellar and right occipital infarctions. ASPECTS is 10   CT head - Interval demarcation of an acute cortically based infarct within the lateral left frontal lobe as described. No acute intracranial hemorrhage or significant mass effect.  MRI head -  Multifocal acute ischemia within the left MCA territory, including the left postcentral gyrus. No hemorrhage or mass effect. 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 Chronic ischemic microangiopathy.  CTA H&N - Prominent soft plaque at the left ICA bulb. 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   2D Echo - EF 55 to 60%. No cardiac source of emboli identified.   Sars Corona Virus 2 - negative  LDL - 182  HgbA1c - 5.1  VTE prophylaxis -Heparin subq  warfarin daily prior to admission,  now on ASA 325 mg daily given moderate sized infarct.  Will consider DOAC in 5 to 7 days  Therapy recommendations:  pending  Disposition:  Pending  Afib with RVR  On amiodarone and Cardizem drip  Still has RVR due to agitation  On warfarin PTA, INR 1.1 on admission  On aspirin for now  We will consider DOAC in 5 to 7 days given moderate size infarct  AMS with agitation, chronic benzo use Respiratory failure with hypoxia  Intubated on sedation  CCM on board  Extubation today planned  On precedex and fentanyl  On resperidone 0.5 bid  B12 deficiency  Vitamin B12 = 161  B12 supplement  History of stroke  11/2014, bilateral blurry vision after left upper extremity bypass surgery.  Found to have left central vision loss.  MRI showed bilateral embolic infarcts.  INR 1.45.  Carotid Doppler negative.  TTE unremarkable.  LDL 82, A1c 5.4.  Continued on discharge with Coumadin and Lipitor 40  hypotension Hx of hypertension  Home BP meds: Diltiazem, Lasix and metoprolol  Current BP meds none  On neo  CCM Plans to d/c neo once off  sedation . Avoid low BP  Hyperlipidemia  Home Lipid lowering medication: Lipitor 40 mg daily  LDL 182, goal < 70  Current lipid lowering medication: Lipitor 80 mg daily   Continue statin at discharge  Dysphagia  N.p.o.  Speech to follow once extubated  P.o. meds through tube  On bolus feed  Fever and leukocytosis  T-max 101.7->100.2  leucocytosis - 11.1->11.7->11.0->14.0->13.4  CXR - Increased opacities at the lung bases with evidence of mild edema, atelectasis and/or consolidation with small right pleural effusion.  UA neg 01/18/19  CCM on board  Tobacco abuse  Current smoker  Smoking cessation counseling will be provided  Pt is willing to quit  Other Stroke Risk Factors  Advanced age  ETOH use, advised to drink no more than 1 alcoholic beverage per day.  Other Active Problems  Hypokalemia 3.3->3.4->3.3->3.1->4.0->4.3->4.4    Hospital day # 4  This patient is critically ill and at significant risk of neurological worsening, death and care requires constant monitoring of vital signs, hemodynamics,respiratory and cardiac monitoring, extensive review of multiple databases, frequent neurological assessment, discussion with family, other specialists and medical decision making of high complexity. I spent 35 minutes of neurocritical care time  in the care of  this patient. I discussed with Dr. Nelda Marseille.  Rosalin Hawking, MD PhD Stroke Neurology 01/22/2019 11:46 AM  To contact Stroke Continuity provider, please refer to [email or web address] After hours, contact General Neurology

## 2019-01-23 LAB — BASIC METABOLIC PANEL
Anion gap: 7 (ref 5–15)
BUN: 31 mg/dL — ABNORMAL HIGH (ref 8–23)
CO2: 24 mmol/L (ref 22–32)
Calcium: 9.2 mg/dL (ref 8.9–10.3)
Chloride: 111 mmol/L (ref 98–111)
Creatinine, Ser: 1.02 mg/dL (ref 0.61–1.24)
GFR calc Af Amer: >60 mL/min (ref >60)
GFR calc non Af Amer: >60 mL/min (ref >60)
Glucose, Bld: 150 mg/dL — ABNORMAL HIGH (ref 70–99)
Potassium: 4.4 mmol/L (ref 3.5–5.1)
Sodium: 142 mmol/L (ref 135–145)

## 2019-01-23 LAB — CBC
HCT: 39.4 % (ref 39.0–52.0)
Hemoglobin: 12.8 g/dL — ABNORMAL LOW (ref 13.0–17.0)
MCH: 35.2 pg — ABNORMAL HIGH (ref 26.0–34.0)
MCHC: 32.5 g/dL (ref 30.0–36.0)
MCV: 108.2 fL — ABNORMAL HIGH (ref 80.0–100.0)
Platelets: 196 10*3/uL (ref 150–400)
RBC: 3.64 MIL/uL — ABNORMAL LOW (ref 4.22–5.81)
RDW: 14.8 % (ref 11.5–15.5)
WBC: 12.6 10*3/uL — ABNORMAL HIGH (ref 4.0–10.5)
nRBC: 0 % (ref 0.0–0.2)

## 2019-01-23 LAB — CULTURE, BLOOD (ROUTINE X 2): Culture: NO GROWTH

## 2019-01-23 LAB — GLUCOSE, CAPILLARY
Glucose-Capillary: 100 mg/dL — ABNORMAL HIGH (ref 70–99)
Glucose-Capillary: 121 mg/dL — ABNORMAL HIGH (ref 70–99)
Glucose-Capillary: 81 mg/dL (ref 70–99)
Glucose-Capillary: 81 mg/dL (ref 70–99)
Glucose-Capillary: 93 mg/dL (ref 70–99)

## 2019-01-23 MED ORDER — OXYCODONE HCL 5 MG/5ML PO SOLN
5.0000 mg | Freq: Four times a day (QID) | ORAL | Status: DC
  Administered 2019-01-23 – 2019-01-30 (×28): 5 mg
  Filled 2019-01-23 (×28): qty 5

## 2019-01-23 MED ORDER — CLONAZEPAM 0.1 MG/ML ORAL SUSPENSION
0.5000 mg | Freq: Two times a day (BID) | ORAL | Status: DC
  Filled 2019-01-23: qty 5

## 2019-01-23 MED ORDER — CLONAZEPAM 0.5 MG PO TBDP
0.5000 mg | ORAL_TABLET | Freq: Two times a day (BID) | ORAL | Status: DC
  Administered 2019-01-23 – 2019-01-24 (×4): 0.5 mg
  Filled 2019-01-23 (×4): qty 1

## 2019-01-23 MED ORDER — METOPROLOL TARTRATE 25 MG/10 ML ORAL SUSPENSION
25.0000 mg | Freq: Two times a day (BID) | ORAL | Status: DC
  Administered 2019-01-23 – 2019-01-24 (×3): 25 mg
  Filled 2019-01-23 (×4): qty 10

## 2019-01-23 MED ORDER — RISPERIDONE 1 MG/ML PO SOLN
1.0000 mg | Freq: Two times a day (BID) | ORAL | Status: DC
  Administered 2019-01-23 – 2019-01-24 (×3): 1 mg
  Filled 2019-01-23 (×4): qty 1

## 2019-01-23 MED ORDER — SENNOSIDES-DOCUSATE SODIUM 8.6-50 MG PO TABS
1.0000 | ORAL_TABLET | Freq: Two times a day (BID) | ORAL | Status: DC
  Administered 2019-01-23 – 2019-02-04 (×18): 1 via ORAL
  Filled 2019-01-23 (×18): qty 1

## 2019-01-23 MED ORDER — CLONIDINE HCL 0.3 MG PO TABS
0.3000 mg | ORAL_TABLET | Freq: Four times a day (QID) | ORAL | Status: DC
  Administered 2019-01-24 – 2019-01-27 (×13): 0.3 mg
  Filled 2019-01-23 (×13): qty 1

## 2019-01-23 MED ORDER — CLONIDINE HCL 0.3 MG PO TABS
0.3000 mg | ORAL_TABLET | Freq: Four times a day (QID) | ORAL | Status: DC
  Administered 2019-01-23 (×3): 0.3 mg via ORAL
  Filled 2019-01-23 (×3): qty 1

## 2019-01-23 NOTE — Progress Notes (Signed)
PT Cancellation Note  Patient Details Name: CAS MERGEL MRN: [id number] DOB: Mar 04, 1950   Cancelled Treatment:    Reason Eval/Treat Not Completed: Patient not medically ready. Intubated and sedated.   Shary Decamp Maycok 01/23/2019, 1:05 PM [place] Pager [phone] Office [phone]

## 2019-01-23 NOTE — Progress Notes (Signed)
NAME:  [name], MRN:  [id number], DOB:  1950-11-01, LOS: 5 ADMISSION DATE:  01/18/2019, CONSULTATION DATE:  01/18/19 REFERRING MD:  Leonel Ramsay, CHIEF COMPLAINT:  Unable to elicit   Brief History   38yM with hypertensive emergency in setting of acute L MCA stroke s/p tPA, AF/RVR  Has been noncompliant with his warfarin for A. fib. Code stroke activated, TPA administered Intubated for respiratory failure.  PCCM consulted for vent management  Past Medical History  AF COPD CVA PAD  Significant Hospital Events   12/10 tPA administered at 1946 12/11-intubated  Consults:  PCCM  Procedures:  none  Significant Diagnostic Tests:  CTA h/n: distal MCA branches that lose opacification and there could be a few small emboli in the left MCA territory.  Micro Data:  Blood 12/10> NGTD Urine 12/10> insignificant growth  MRSA surveillance negative  SARS Coronavirus 2 negative   Antimicrobials:  none  Interim history/subjective:  On PSV 10/5.  Versed, fentanyl and precedex on board requiring low dose neosynephrine. Less agitation overnight.    Objective   Blood pressure 103/85, pulse 84, temperature 98.7 F (37.1 C), temperature source Oral, resp. rate 18, weight 77.7 kg, SpO2 99 %.    Vent Mode: PSV;CPAP FiO2 (%):  [30 %] 30 % Set Rate:  [12 bmp] 12 bmp Vt Set:  [540 mL] 540 mL PEEP:  [5 cmH20] 5 cmH20 Pressure Support:  [10 cmH20] 10 cmH20 Plateau Pressure:  [13 cmH20-18 cmH20] 15 cmH20   Intake/Output Summary (Last 24 hours) at 01/23/2019 0907 Last data filed at 01/23/2019 0800 Gross per 24 hour  Intake 2629.28 ml  Output 1050 ml  Net 1579.28 ml   Filed Weights   01/21/19 0414 01/22/19 0438 01/23/19 0412  Weight: 72.3 kg 72.5 kg 77.7 kg    Examination: General: thin elderly male, resting quietly. NAD HENT: R eye ecchymosis.  PERRL, 74mm slugghish. Moist mucus membranes Neck: No JVD. Trachea midline. CV: IRRR. S1S2. No MRG. +2 distal pulses Lungs: BBS  present, diminished bases, scattered rhonchi, FNL, symmetrical on PSV  ABD: +BS x4. SNT/ND. No masses, guarding or rigidity GU: Foley EXT:  No edema Skin: PWD. In tact. No rashes or lesions Neuro: withdraws x 4. Does not resist passive eye opening     Resolved Hospital Problem list     Assessment & Plan:  Acute stroke with distal branch occlusions of left MCA Status post TPA Management per neurology  Acute respiratory failure due to CVA -Continue ventilator support to prevent eminent deterioration and further organ dysfunction from hypoxemia and hypercarbia.    -Maintain SpO2 greater than or equal to 90%. --Head of bed elevated 30 degrees. -Plateau pressures less than 30 cm H20.  -Follow chest x-ray, ABG prn.   -SAT/SBT as tolerated. Doing well this am. Mental status may preclude safe extubation -Bronchial hygiene. -RT/bronchodilator protocol.   Agitation -Continue risperidone 0.5 twice daily  Hypertensive emergency Atrial fibrillation with RVR-now rate contolled -Continue monitor -restart lopressor 25mg  PO BID and escalate as tolerated-on 100mg  BID at home   Chronic benzodiazepine use -Remains on Precedex-wean for goal RASS 0 to -1  -wean versed and fentanyl  -consider low dose clonidine   Hold in the ICU given high risk of reintubation and alcohol withdrawal  Best practice:  Diet: N.p.o. TF per recs  Pain/Anxiety/Delirium protocol (if indicated): Precedex, fentanyl, versed  VAP protocol (if indicated): In place DVT prophylaxis: SCDs GI prophylaxis: PPI  Glucose control: has not required coverage. D/c accu checks. Hypoglycemia  protocol  Mobility: Bedrest Code Status: Full code Family Communication: Per primary Disposition:  ICU  Labs and imaging for past 48 hours reviewed in EMR      The patient is critically ill with respiratory failure. He requires ICU for high complexity decision making, titration of high alert medications, ventilator management, titration  of oxygen and interpretation of advanced monitoring.    I personally spent 35 minutes providing critical care services including personally reviewing test results, discussing care with nursing staff/other physicians and completing orders pertaining to this patient.  Time was exclusive to the patient and does not include time spent teaching or in procedures.  Voice recognition software was used in the production of this record.  Errors in interpretation may have been inadvertently missed during review.  Francine Graven, MSN, AGACNP  [facility]

## 2019-01-23 NOTE — Progress Notes (Signed)
OT Cancellation Note  Patient Details Name: KASHIS VANA MRN: [id number] DOB: 1950-05-27   Cancelled Treatment:    Reason Eval/Treat Not Completed: Patient not medically ready; pt remains intubated and sedated.  Will follow and see as able/medically appropriate.   Jolaine Artist, OT Acute Rehabilitation Services Pager [phone] Office [phone]   Delight Stare 01/23/2019, 1:18 PM

## 2019-01-23 NOTE — Progress Notes (Signed)
Patient transported from [id number] to [id number] without complications. RN at bedside.

## 2019-01-23 NOTE — Progress Notes (Signed)
STROKE TEAM PROGRESS NOTE   INTERVAL HISTORY Pt still intubated and heavily sedated. Unresponsive with sedation. On vent. Not able to be extubated.    OBJECTIVE Vitals:   01/23/19 0800 01/23/19 0900 01/23/19 1000 01/23/19 1100  BP: 103/85 105/88 98/80 102/77  Pulse: 84   98  Resp: 18 16 18 20   Temp: 98.7 F (37.1 C)     TempSrc: Oral     SpO2: 99%   97%  Weight:        CBC:  Recent Labs  Lab 01/18/19 1923 01/18/19 1928 01/22/19 0324 01/23/19 0411  WBC 11.1*   < > 13.4* 12.6*  NEUTROABS 6.8  --   --   --   HGB 15.1  --  13.0 12.8*  HCT 43.7  --  38.7* 39.4  MCV 103.3*   < > 105.7* 108.2*  PLT 244   < > 165 196   < > = values in this interval not displayed.    Basic Metabolic Panel:  Recent Labs  Lab 01/20/19 0713 01/20/19 1700 01/22/19 0324 01/23/19 0411  NA 139  --  139 142  K 4.0  --  4.4 4.4  CL 108  --  108 111  CO2 22  --  23 24  GLUCOSE 89  --  109* 150*  BUN 16  --  29* 31*  CREATININE 0.93  --  0.92 1.02  CALCIUM 8.7*  --  8.5* 9.2  MG 1.7 1.7  --   --   PHOS 3.1 2.5  --   --     Lipid Panel:     Component Value Date/Time   CHOL 238 (H) 01/19/2019 0437   TRIG 64 01/19/2019 0437   HDL 43 01/19/2019 0437   CHOLHDL 5.5 01/19/2019 0437   VLDL 13 01/19/2019 0437   LDLCALC 182 (H) 01/19/2019 0437   HgbA1c:  Lab Results  Component Value Date   HGBA1C 5.1 01/19/2019   Urine Drug Screen:     Component Value Date/Time   LABOPIA NONE DETECTED 07/13/2016 1428   COCAINSCRNUR NONE DETECTED 07/13/2016 1428   LABBENZ POSITIVE (A) 07/13/2016 1428   AMPHETMU NONE DETECTED 07/13/2016 1428   THCU POSITIVE (A) 07/13/2016 1428   LABBARB NONE DETECTED 07/13/2016 1428    Alcohol Level     Component Value Date/Time   ETH <5 02/09/2015 0010    IMAGING  CT Code Stroke CTA Head W/WO contrast CT Code Stroke CTA Neck W/WO contrast 01/18/2019 IMPRESSION:   Atherosclerotic disease at both carotid bifurcations but no stenosis.   Prominent soft  plaque at the left ICA bulb.   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   Dominant right vertebral artery widely patent.   No intracranial large or medium vessel occlusion or correctable proximal stenosis, with specific attention to the left MCA branches.   I do think there are a few distal MCA branches that lose opacification and there could be a few small emboli in the left MCA territory. Vessels marked with arrows.   Distal vessel atherosclerotic irregularity, particularly evident in the PCA branches.   Aortic Atherosclerosis (ICD10-I70.0)   Emphysema (ICD10-J43.9).   CT HEAD CODE STROKE WO CONTRAST 01/18/2019  IMPRESSION: 1. No acute finding by CT. Chronic small-vessel ischemic changes of the white matter. Old right cerebellar and right occipital infarctions.  2. ASPECTS is 10   MRI Wo Contrast  01/20/2019 IMPRESSION: 1. Multifocal acute ischemia within  the left MCA territory, including the left postcentral gyrus. No hemorrhage or mass effect. 2. 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 3. Chronic ischemic microangiopathy.  CT Head Wo Contrast  01/19/2019 IMPRESSION: Interval demarcation of an acute cortically based infarct within the lateral left frontal lobe as described. No acute intracranial hemorrhage or significant mass effect.  Transthoracic Echocardiogram  01/19/2019 IMPRESSIONS  1. Left ventricular ejection fraction, by visual estimation, is 55 to 60%. The left ventricle has normal function. There is moderately increased left ventricular hypertrophy.  2. The left ventricle demonstrates regional wall motion abnormalities.  3. Global right ventricle has normal systolic function.The right ventricular size is normal. No increase in right ventricular wall thickness.  4. Left atrial size was normal.  5. Right atrial size was mildly dilated.  6. The mitral valve is normal in structure. Mild to  moderate mitral valve regurgitation.  7. The tricuspid valve is normal in structure. Tricuspid valve regurgitation is trivial.  8. The aortic valve is normal in structure. Aortic valve regurgitation is not visualized.  9. The pulmonic valve was normal in structure. Pulmonic valve regurgitation is not visualized. 10. The atrial septum is grossly normal.  ECG atrial fibrillation - ventricular response 133 BPM (See cardiology reading for complete details)  PHYSICAL EXAM    Blood pressure 102/77, pulse 98, temperature 98.7 F (37.1 C), temperature source Oral, resp. rate 20, weight 77.7 kg, SpO2 97 %.  General - Well nourished, well developed, intubated on sedation.  Ophthalmologic - fundi not visualized due to noncooperation.  Cardiovascular - irregularly irregular heart rate and rhythm, afib RVR on tele.  Neuro - intubated on sedation, eyes closed, not following commands. With forced eye opening, eyes in mid position, not blinking to visual threat bilaterally, not tracking, pupil bilaterally 2 mm, sluggish to light. Corneal reflex weakly present L>R, gag and cough present.  Breathing over the vent.  Facial symmetry not able to test due to ET tube. On pain stimulation, no movement of all extremities. Sensation, coordination and gait not tested.   ASSESSMENT/PLAN Mr. AHLIAS OYOLA is a 68 y.o. male with history of tobacco use, previous strokes, COPD, AAA, ASPVD, htn, hld, pulmonary nodule, depression, atrial fibrillation on coumadin but recently missed doses presenting with transient aphasia and leaning to the right. The patient received IV t-PA Thursday 01/18/19 @ 2000.  Stroke - left MCA infarct s/p t-PA - embolic pattern, likely due to atrial fibrillation with subtherapeutic INR  Resultant left gaze, right hemiparesis, right neglect and agitation  Code Stroke CT Head - No acute finding by CT. Chronic small-vessel ischemic changes of the white matter. Old right cerebellar and right  occipital infarctions. ASPECTS is 10   CT head - Interval demarcation of an acute cortically based infarct within the lateral left frontal lobe as described. No acute intracranial hemorrhage or significant mass effect.  MRI head -  Multifocal acute ischemia within the left MCA territory, including the left postcentral gyrus. No hemorrhage or mass effect. 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 Chronic ischemic microangiopathy.  CTA H&N - Prominent soft plaque at the left ICA bulb. 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   2D Echo - EF 55 to 60%. No cardiac source of emboli identified.   Sars Corona Virus 2 - negative  LDL - 182  HgbA1c - 5.1  VTE prophylaxis -Heparin subq  warfarin daily prior to admission, now on ASA 325 mg daily given  moderate sized infarct.  Will repeat CT and consider DOAC in 5-7 days post stroke  Therapy recommendations:  pending  Disposition:  Pending  Afib with RVR  On amiodarone and Cardizem drip  Still has RVR due to agitation  On lopressor 25 bid (on 100 bid at home) increase as needed    On warfarin PTA, INR 1.1 on admission  On aspirin for now  We will consider DOAC in 5-7 days post stroke given moderate size infarct  AMS with agitation, chronic benzo use Respiratory failure with hypoxia  Intubated on sedation  CCM on board  Consider Extubation, mental status may preclude  On precedex and fentanyl (weaning)  On resperidone 1mg  bid, klonopin 0.5 bid, oxycodone 5 mb q6h  B12 deficiency  Vitamin B12 = 161  B12 supplement  History of stroke  11/2014, bilateral blurry vision after left upper extremity bypass surgery.  Found to have left central vision loss.  MRI showed bilateral embolic infarcts.  INR 1.45.  Carotid Doppler negative.  TTE unremarkable.  LDL 82, A1c 5.4.  Continued on discharge with Coumadin and Lipitor 40  hypotension Hx of  hypertension  Home BP meds: Diltiazem, Lasix and metoprolol  Current BP meds clonidine 0.3 q 6h , metoprolol 25 bid  On neo  CCM Plans to d/c neo once off sedation . Avoid low BP  Hyperlipidemia  Home Lipid lowering medication: Lipitor 40 mg daily  LDL 182, goal < 70  Current lipid lowering medication: Lipitor 80 mg daily   Continue statin at discharge  Dysphagia  N.p.o.  Speech to follow once extubated  P.o. meds through tube  On bolus feed  Fever and leukocytosis  T-max 101.7->100.2  leucocytosis - 11.1->11.7->11.0->14.0->13.4->12.6   CXR - Increased opacities at the lung bases with evidence of mild edema, atelectasis and/or consolidation with small right pleural effusion.  CXR in am  UA neg 01/18/19  CCM on board  Tobacco abuse  Current smoker  Smoking cessation counseling will be provided  Pt is willing to quit  Other Stroke Risk Factors  Advanced age  ETOH use, advised to drink no more than 1 alcoholic beverage per day.  Other Active Problems  Hypokalemia 3.3->3.4->3.3->3.1->4.0->4.3->4.4->4.4    Hospital day # 5  This patient is critically ill and at significant risk of neurological worsening, death and care requires constant monitoring of vital signs, hemodynamics,respiratory and cardiac monitoring, extensive review of multiple databases, frequent neurological assessment, discussion with family, other specialists and medical decision making of high complexity. I spent 35 minutes of neurocritical care time  in the care of  this patient.   Rosalin Hawking, MD PhD Stroke Neurology 01/23/2019 11:25 AM  To contact Stroke Continuity provider, please refer to [email or web address] After hours, contact General Neurology

## 2019-01-24 ENCOUNTER — Inpatient Hospital Stay (HOSPITAL_COMMUNITY): Payer: Medicare Other

## 2019-01-24 DIAGNOSIS — I482 Chronic atrial fibrillation, unspecified: Secondary | ICD-10-CM

## 2019-01-24 LAB — BASIC METABOLIC PANEL
Anion gap: 10 (ref 5–15)
Anion gap: 9 (ref 5–15)
BUN: 30 mg/dL — ABNORMAL HIGH (ref 8–23)
BUN: 30 mg/dL — ABNORMAL HIGH (ref 8–23)
CO2: 24 mmol/L (ref 22–32)
CO2: 24 mmol/L (ref 22–32)
Calcium: 8.5 mg/dL — ABNORMAL LOW (ref 8.9–10.3)
Calcium: 8.6 mg/dL — ABNORMAL LOW (ref 8.9–10.3)
Chloride: 108 mmol/L (ref 98–111)
Chloride: 111 mmol/L (ref 98–111)
Creatinine, Ser: 0.84 mg/dL (ref 0.61–1.24)
Creatinine, Ser: 0.77 mg/dL (ref 0.61–1.24)
GFR calc Af Amer: >60 mL/min (ref >60)
GFR calc Af Amer: >60 mL/min (ref >60)
GFR calc non Af Amer: >60 mL/min (ref >60)
GFR calc non Af Amer: >60 mL/min (ref >60)
Glucose, Bld: 110 mg/dL — ABNORMAL HIGH (ref 70–99)
Glucose, Bld: 132 mg/dL — ABNORMAL HIGH (ref 70–99)
Potassium: 4 mmol/L (ref 3.5–5.1)
Potassium: 3.5 mmol/L (ref 3.5–5.1)
Sodium: 142 mmol/L (ref 135–145)
Sodium: 144 mmol/L (ref 135–145)

## 2019-01-24 LAB — CBC
HCT: 38.2 % — ABNORMAL LOW (ref 39.0–52.0)
Hemoglobin: 12.4 g/dL — ABNORMAL LOW (ref 13.0–17.0)
MCH: 35.2 pg — ABNORMAL HIGH (ref 26.0–34.0)
MCHC: 32.5 g/dL (ref 30.0–36.0)
MCV: 108.5 fL — ABNORMAL HIGH (ref 80.0–100.0)
Platelets: 207 10*3/uL (ref 150–400)
RBC: 3.52 MIL/uL — ABNORMAL LOW (ref 4.22–5.81)
RDW: 14.8 % (ref 11.5–15.5)
WBC: 10 10*3/uL (ref 4.0–10.5)
nRBC: 0 % (ref 0.0–0.2)

## 2019-01-24 LAB — CULTURE, BLOOD (ROUTINE X 2)
Culture: NO GROWTH
Special Requests: ADEQUATE

## 2019-01-24 LAB — PROCALCITONIN: Procalcitonin: 2.83 ng/mL

## 2019-01-24 LAB — GLUCOSE, CAPILLARY
Glucose-Capillary: 71 mg/dL (ref 70–99)
Glucose-Capillary: 140 mg/dL — ABNORMAL HIGH (ref 70–99)

## 2019-01-24 LAB — MAGNESIUM: Magnesium: 1.9 mg/dL (ref 1.7–2.4)

## 2019-01-24 MED ORDER — DILTIAZEM 12 MG/ML ORAL SUSPENSION
30.0000 mg | Freq: Four times a day (QID) | ORAL | Status: DC
  Administered 2019-01-24 – 2019-02-21 (×109): 30 mg
  Filled 2019-01-24 (×117): qty 3

## 2019-01-24 MED ORDER — FENTANYL CITRATE (PF) 100 MCG/2ML IJ SOLN
25.0000 ug | INTRAMUSCULAR | Status: DC | PRN
  Administered 2019-01-27 – 2019-02-02 (×2): 25 ug via INTRAVENOUS
  Filled 2019-01-24 (×2): qty 2

## 2019-01-24 MED ORDER — AMIODARONE HCL 200 MG PO TABS
200.0000 mg | ORAL_TABLET | Freq: Every day | ORAL | Status: DC
  Administered 2019-01-24 – 2019-03-03 (×39): 200 mg
  Filled 2019-01-24 (×41): qty 1

## 2019-01-24 MED ORDER — MIDAZOLAM HCL 2 MG/2ML IJ SOLN: 1.0000 mg | INTRAMUSCULAR | Status: DC | PRN

## 2019-01-24 MED ORDER — MIDAZOLAM HCL 2 MG/2ML IJ SOLN
0.5000 mg | INTRAMUSCULAR | Status: DC | PRN
  Administered 2019-01-24: 0.5 mg via INTRAVENOUS
  Filled 2019-01-24: qty 2

## 2019-01-24 MED ORDER — AMIODARONE IV BOLUS ONLY 150 MG/100ML
150.0000 mg | Freq: Once | INTRAVENOUS | Status: AC
  Administered 2019-01-24: 150 mg via INTRAVENOUS
  Filled 2019-01-24: qty 100

## 2019-01-24 MED ORDER — FENTANYL CITRATE (PF) 100 MCG/2ML IJ SOLN
25.0000 ug | INTRAMUSCULAR | Status: DC | PRN
  Administered 2019-01-24 – 2019-01-25 (×8): 100 ug via INTRAVENOUS
  Administered 2019-01-25: 09:00:00 50 ug via INTRAVENOUS
  Administered 2019-01-25 – 2019-02-02 (×6): 100 ug via INTRAVENOUS
  Filled 2019-01-24 (×16): qty 2

## 2019-01-24 MED ORDER — MIDAZOLAM HCL 2 MG/2ML IJ SOLN
1.0000 mg | INTRAMUSCULAR | Status: DC | PRN
  Administered 2019-01-24 – 2019-01-26 (×4): 1 mg via INTRAVENOUS
  Filled 2019-01-24 (×4): qty 2

## 2019-01-24 NOTE — Progress Notes (Signed)
eLink Physician-Brief Progress Note Patient Name: [name] DOB: 10/03/1950 MRN: [id number]   Date of Service  01/24/2019  HPI/Events of Note  Multiple issues: 1. AFIB with RVR - Ventricular rate = 120's - 150's and severe agitation. Patient is already on Diltiazem and Amiodarone PO.   eICU Interventions  Will order: 1. Amiodarone 150 mg IV over 10 minutes now.  2. BMP and Mg++ level STAT. 3. Increase Versed to 1 mg IV Q 4 hours PRN agitation.      Intervention Category Major Interventions: Delirium, psychosis, severe agitation - evaluation and management;Arrhythmia - evaluation and management  Lavonne Cass Eugene 01/24/2019, 8:18 PM

## 2019-01-24 NOTE — Progress Notes (Signed)
OT Cancellation Note  Patient Details Name: [name] MRN: [id number] DOB: 1950-07-08   Cancelled Treatment:    Reason Eval/Treat Not Completed: Medical issues which prohibited therapy;Patient not medically ready. Pt remains intubated and sedated, not appropriate to engage in OT evaluation and per RN recommended sign off.  Please re-order when appropriate.    Jolaine Artist, OT Acute Rehabilitation Services Pager [phone] Office [phone]    Delight Stare 01/24/2019, 9:46 AM

## 2019-01-24 NOTE — Progress Notes (Signed)
STROKE TEAM PROGRESS NOTE   INTERVAL HISTORY RN at bedside. Pt has been off fentanyl and versed. Now is tapering off precedex. Pt eyes closed, barely open with pain stimulation, not following commands or tracking. Given difficult exam and lack of response as before off sedation, will repeat CT head.  OBJECTIVE Vitals:   01/24/19 0830 01/24/19 0900 01/24/19 1000 01/24/19 1030  BP: 118/83 117/78 109/81 113/85  Pulse: (!) 101 92 95 93  Resp: (!) 24 (!) 22 (!) 23 (!) 24  Temp:  100 F (37.8 C)  99.4 F (37.4 C)  TempSrc:  Oral  Oral  SpO2: 98% 98% 98% 99%  Weight:        CBC:  Recent Labs  Lab 01/18/19 1923 01/18/19 1928 01/23/19 0411 01/24/19 0400  WBC 11.1*   < > 12.6* 10.0  NEUTROABS 6.8  --   --   --   HGB 15.1  --  12.8* 12.4*  HCT 43.7  --  39.4 38.2*  MCV 103.3*   < > 108.2* 108.5*  PLT 244   < > 196 207   < > = values in this interval not displayed.    Basic Metabolic Panel:  Recent Labs  Lab 01/20/19 0713 01/20/19 1700 01/23/19 0411 01/24/19 0400  NA 139  --  142 142  K 4.0  --  4.4 4.0  CL 108  --  111 108  CO2 22  --  24 24  GLUCOSE 89  --  150* 110*  BUN 16  --  31* 30*  CREATININE 0.93  --  1.02 0.84  CALCIUM 8.7*  --  9.2 8.5*  MG 1.7 1.7  --   --   PHOS 3.1 2.5  --   --     Lipid Panel:     Component Value Date/Time   CHOL 238 (H) 01/19/2019 0437   TRIG 64 01/19/2019 0437   HDL 43 01/19/2019 0437   CHOLHDL 5.5 01/19/2019 0437   VLDL 13 01/19/2019 0437   LDLCALC 182 (H) 01/19/2019 0437   HgbA1c:  Lab Results  Component Value Date   HGBA1C 5.1 01/19/2019   Urine Drug Screen:     Component Value Date/Time   LABOPIA NONE DETECTED 07/13/2016 1428   COCAINSCRNUR NONE DETECTED 07/13/2016 1428   LABBENZ POSITIVE (A) 07/13/2016 1428   AMPHETMU NONE DETECTED 07/13/2016 1428   THCU POSITIVE (A) 07/13/2016 1428   LABBARB NONE DETECTED 07/13/2016 1428    Alcohol Level     Component Value Date/Time   ETH <5 02/09/2015 0010     IMAGING  CT Code Stroke CTA Head W/WO contrast CT Code Stroke CTA Neck W/WO contrast 01/18/2019 IMPRESSION:   Atherosclerotic disease at both carotid bifurcations but no stenosis.   Prominent soft plaque at the left ICA bulb.   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   Dominant right vertebral artery widely patent.   No intracranial large or medium vessel occlusion or correctable proximal stenosis, with specific attention to the left MCA branches.   I do think there are a few distal MCA branches that lose opacification and there could be a few small emboli in the left MCA territory. Vessels marked with arrows.   Distal vessel atherosclerotic irregularity, particularly evident in the PCA branches.   Aortic Atherosclerosis (ICD10-I70.0)   Emphysema (ICD10-J43.9).   CT HEAD CODE STROKE WO CONTRAST 01/18/2019  IMPRESSION: 1. No acute finding by CT.  Chronic small-vessel ischemic changes of the white matter. Old right cerebellar and right occipital infarctions.  2. ASPECTS is 10   MRI Wo Contrast  01/20/2019 IMPRESSION: 1. Multifocal acute ischemia within the left MCA territory, including the left postcentral gyrus. No hemorrhage or mass effect. 2. 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 3. Chronic ischemic microangiopathy.  CT Head Wo Contrast  01/19/2019 IMPRESSION: Interval demarcation of an acute cortically based infarct within the lateral left frontal lobe as described. No acute intracranial hemorrhage or significant mass effect.  Transthoracic Echocardiogram  01/19/2019 IMPRESSIONS  1. Left ventricular ejection fraction, by visual estimation, is 55 to 60%. The left ventricle has normal function. There is moderately increased left ventricular hypertrophy.  2. The left ventricle demonstrates regional wall motion abnormalities.  3. Global right ventricle has normal systolic function.The right  ventricular size is normal. No increase in right ventricular wall thickness.  4. Left atrial size was normal.  5. Right atrial size was mildly dilated.  6. The mitral valve is normal in structure. Mild to moderate mitral valve regurgitation.  7. The tricuspid valve is normal in structure. Tricuspid valve regurgitation is trivial.  8. The aortic valve is normal in structure. Aortic valve regurgitation is not visualized.  9. The pulmonic valve was normal in structure. Pulmonic valve regurgitation is not visualized. 10. The atrial septum is grossly normal.  ECG atrial fibrillation - ventricular response 133 BPM (See cardiology reading for complete details)  PHYSICAL EXAM   Blood pressure 113/85, pulse 93, temperature 99.4 F (37.4 C), temperature source Oral, resp. rate (!) 24, weight 80.8 kg, SpO2 99 %.  General - Well nourished, well developed, intubated off sedation.  Ophthalmologic - fundi not visualized due to noncooperation.  Cardiovascular - irregularly irregular heart rate and rhythm.  Neuro - intubated off sedation now, eyes closed, not following commands. Barely open eyes with pain stimulation. With forced eye opening, eyes in mid position, slight blinking to visual threat bilaterally, not tracking, pupil bilaterally 2 mm, sluggish to light. Corneal reflex weakly present L>R, gag and cough present.  Breathing over the vent.  Facial symmetry not able to test due to ET tube. On pain stimulation, no movement of b/l upper extremities, slight toe movement at BLEs. Sensation, coordination and gait not tested.   ASSESSMENT/PLAN [name] is a 68 y.o. male with history of tobacco use, previous strokes, COPD, AAA, ASPVD, htn, hld, pulmonary nodule, depression, atrial fibrillation on coumadin but recently missed doses presenting with transient aphasia and leaning to the right. The patient received IV t-PA Thursday 01/18/19 @ 2000.  Stroke - left MCA infarct s/p t-PA - embolic  pattern, likely due to atrial fibrillation with subtherapeutic INR  Resultant left gaze, right hemiparesis, right neglect and agitation  Code Stroke CT Head - No acute finding by CT. Chronic small-vessel ischemic changes of the white matter. Old right cerebellar and right occipital infarctions. ASPECTS is 10   CT head - Interval demarcation of an acute cortically based infarct within the lateral left frontal lobe as described. No acute intracranial hemorrhage or significant mass effect.  MRI head -  Multifocal acute ischemia within the left MCA territory, including the left postcentral gyrus. No hemorrhage or mass effect. 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 Chronic ischemic microangiopathy.  CTA H&N - Prominent soft plaque at the left ICA bulb. 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   CT repeat today  2D  Echo - EF 55 to 60%. No cardiac source of emboli identified.   Sars Corona Virus 2 - negative  LDL - 182  HgbA1c - 5.1  VTE prophylaxis -Heparin subq  warfarin daily prior to admission, now on ASA 325 mg daily given moderate sized infarct.  Will repeat CT and consider DOAC in 5-7 days post stroke  Therapy recommendations:  Signed off, reorder once off vent  Disposition:  Pending  Afib with RVR  On amiodarone and Cardizem drip  Still has RVR due to agitation  On lopressor 25 bid (on 100 bid at home) increase as needed    On warfarin PTA, INR 1.1 on admission  On aspirin for now  We will consider DOAC in 5-7 days post stroke given moderate size infarct  AMS with agitation, chronic benzo use Respiratory failure with hypoxia  Intubated off sedation now  Weaning off precedex and fentanyl and versed  On resperidone 1mg  bid, klonopin 0.5 bid, oxycodone 5 mb q6h  CXR Increased right pleural effusion with associated right basilar atelectasis or infiltrate  Mentation precludes  extubation  Wean off sedation  Repeat CT head today  CCM on board  B12 deficiency  Vitamin B12 = 161  B12 supplement  History of stroke  11/2014, bilateral blurry vision after left upper extremity bypass surgery.  Found to have left central vision loss.  MRI showed bilateral embolic infarcts.  INR 1.45.  Carotid Doppler negative.  TTE unremarkable.  LDL 82, A1c 5.4.  Continued on discharge with Coumadin and Lipitor 40  hypotension Hx of hypertension  Home BP meds: Diltiazem, Lasix and metoprolol  Current BP meds clonidine 0.3 q 6h , metoprolol 25 bid  BP on the lower side, on neo now  CCM Plans to d/c neo once off sedation . Avoid low BP  Hyperlipidemia  Home Lipid lowering medication: Lipitor 40 mg daily  LDL 182, goal < 70  Current lipid lowering medication: Lipitor 80 mg daily   Continue statin at discharge  Dysphagia  N.p.o.  Speech to follow once extubated  P.o. meds through tube  On bolus feed  Fever and leukocytosis  T-max 101.7->100.2->100.4  leucocytosis - 11.1->11.7->11.0->14.0->13.4->12.6->10.0  CXR with increased R pleural effusion w/ R basilar atx/infiltrate  UA neg 01/18/19  CCM on board  Tobacco abuse  Current smoker  Smoking cessation counseling will be provided  Pt is willing to quit  Other Stroke Risk Factors  Advanced age  ETOH use, advised to drink no more than 1 alcoholic beverage per day.  Other Active Problems  Hypokalemia 3.3->3.4->3.3->3.1->4.0->4.3->4.4->4.4->4.0   Hospital day # 6  This patient is critically ill and at significant risk of neurological worsening, death and care requires constant monitoring of vital signs, hemodynamics,respiratory and cardiac monitoring, extensive review of multiple databases, frequent neurological assessment, discussion with family, other specialists and medical decision making of high complexity. I spent 35 minutes of neurocritical care time  in the care of  this patient.    Rosalin Hawking, MD PhD Stroke Neurology 01/24/2019 10:37 AM  To contact Stroke Continuity provider, please refer to [email or web address] After hours, contact General Neurology

## 2019-01-24 NOTE — Progress Notes (Signed)
PT Cancellation Note  Patient Details Name: [name] MRN: [id number] DOB: 01-30-1951   Cancelled Treatment:    Reason Eval/Treat Not Completed: Medical issues which prohibited therapy(Nurse asked PT to sign off and ask for reorder.  )Please reorder if and when pt becomes appropriate.    Denice Paradise 01/24/2019, 8:58 AM  Carmesha Morocco W,PT Acute Rehabilitation Services Pager:  [phone]  Office:  [phone]

## 2019-01-24 NOTE — Progress Notes (Signed)
Pharmacy is unable to confirm the patient's home medications. We spoke with his emergency contact who stated the [place] been [place] of the [place] about two years.  We called [facility] and confirmed he recently picked up losartan 50mg  and metoprolol tartrate 100mg . Warfarin was filled but he did not pick it up. The remaining meds on his list are from 2018 or before.   Contact pharmacy if we can be of further assistance.  Romeo Rabon, PharmD Admin. Coordinator of Transitions of Care 01/24/2019,4:26 PM

## 2019-01-24 NOTE — Progress Notes (Signed)
SLP Cancellation Note  Patient Details Name: [name] MRN: [id number] DOB: 08-Jul-1950   Cancelled treatment:        Vent. Will follow.   [name] 01/24/2019, 7:35 AM   Orbie Pyo Colvin Caroli.Ed Actor Pager [phone] Office [phone]  s

## 2019-01-24 NOTE — Progress Notes (Signed)
NAME:  [name], MRN:  [id number], DOB:  Jul 04, 1950, LOS: 6 ADMISSION DATE:  01/18/2019, CONSULTATION DATE:  01/18/19 REFERRING MD:  Leonel Ramsay, CHIEF COMPLAINT:  Unable to elicit   Brief History   51yM with hypertensive emergency in setting of acute L MCA stroke s/p tPA, AF/RVR  Has been noncompliant with his warfarin for A. Fib. Code stroke activated, TPA administered. Intubated for respiratory failure. PCCM consulted for vent management  Past Medical History  AF COPD CVA PAD  Significant Hospital Events   12/10 tPA administered at 1946 12/11 intubated  Consults:  PCCM  Procedures:  none  Significant Diagnostic Tests:  CTA h/n 12/10 > distal MCA branches that lose opacification and there could be a few small emboli in the left MCA territory.  CT head 12/11 > Interval demarcation of an acute cortically based infarct within the lateral left frontal lobe as described. No acute intracranial hemorrhage or significant mass effect.  MRI brain 12/12 >  1. Multifocal acute ischemia within the left MCA territory, including the left postcentral gyrus. No hemorrhage or mass effect. 2. 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 3. Chronic ischemic microangiopathy.  Micro Data:  Blood 12/10 > NGTD Urine 12/10 > insignificant growth  MRSA surveillance 12/11 > negative  SARS Coronavirus 2 12/11 > negative  Antimicrobials:  None  Interim history/subjective:  RN reports agitation overnight with discontinuation of sedating meds. No longer requiring pressor support.   Objective   Blood pressure 105/79, pulse 96, temperature (!) 100.4 F (38 C), temperature source Axillary, resp. rate (!) 25, weight 80.8 kg, SpO2 99 %.    Vent Mode: PRVC FiO2 (%):  [30 %-40 %] 30 % Set Rate:  [12 bmp] 12 bmp Vt Set:  [540 mL] 540 mL PEEP:  [5 cmH20] 5 cmH20 Pressure Support:  [10 cmH20] 10 cmH20 Plateau Pressure:  [17 cmH20-26 cmH20] 18 cmH20   Intake/Output Summary (Last 24  hours) at 01/24/2019 0831 Last data filed at 01/24/2019 0800 Gross per 24 hour  Intake 3289.23 ml  Output 1360 ml  Net 1929.23 ml   Filed Weights   01/22/19 0438 01/23/19 0412 01/24/19 0300  Weight: 72.5 kg 77.7 kg 80.8 kg    Examination: General: Chronically ill appearing elderly male on mechanical ventilation, in NAD HEENT: ETT, MM pink/moist, PERRL, ecchymosis of right eye from trauma  Neuro: Agitated currently after being suctioned through ETT, unable to follow commands CV: s1s2 regular rate and rhythm, no murmur, rubs, or gallops,  PULM:  Rhonchi throughout left lung, right slightly diminished, remains on full vent support this am  GI: soft, bowel sounds active in all 4 quadrants, non-tender, non-distended, tolerating TF Extremities: warm/dry, no edema  Skin: no rashes or lesions  Resolved Hospital Problem list   Hypertensive emergency   Assessment & Plan:  Acute stroke with distal branch occlusions of left MCA -Status post tPA Plan: Primary management per neurology   Acute respiratory failure due to CVA Fever, concern for developing infection  -Developed low grade fever overnight with worsening right sided opacities on CXR, however WBC downtrending P: Continue ventilator support with lung protective strategies  Wean PEEP and FiO2 for sats greater than 90%. Head of bed elevated 30 degrees. Plateau pressures less than 30 cm H20.  Follow intermittent chest x-ray and ABG.   SAT/SBT as tolerated, mentation preclude extubation  Ensure adequate pulmonary hygiene  Follow cultures  VAP bundle in place  PAD protocol Obtain procal  Obtain sputum culture  Agitation Plan: Continue Risperidone 0.5mg  daily   Atrial fibrillation with RVR -now rate contolled Plan: Continue IV cardizem Subq heparin  Continue lopressor   Chronic benzodiazepine use Plan: Remains on precedex drip Wean sedation as able, attempt PRN pushes  Continue low dose clonidine    Hold in the  ICU given high risk of reintubation and alcohol withdrawal  Best practice:  Diet: N.p.o. TF per recs  Pain/Anxiety/Delirium protocol (if indicated): Precedex, fentanyl, versed  VAP protocol (if indicated): In place DVT prophylaxis: SCDs GI prophylaxis: PPI  Glucose control: has not required coverage. D/c accu checks. Hypoglycemia protocol  Mobility: Bedrest Code Status: Full code Family Communication: Per primary Disposition:  ICU  Labs and imaging for past 48 hours reviewed in EMR    Critical care:   Performed by: Johnsie Cancel   Total critical care time: 37 minutes  Critical care time was exclusive of separately billable procedures and treating other patients.  Critical care was necessary to treat or prevent imminent or life-threatening deterioration.  Critical care was time spent personally by me on the following activities: development of treatment plan with patient and/or surrogate as well as nursing, discussions with consultants, evaluation of patient's response to treatment, examination of patient, obtaining history from patient or surrogate, ordering and performing treatments and interventions, ordering and review of laboratory studies, ordering and review of radiographic studies, pulse oximetry and re-evaluation of patient's condition.  Johnsie Cancel, NP-C [facility] Contact / Pager information can be found on Amion  01/24/2019, 8:49 AM

## 2019-01-25 DIAGNOSIS — Z978 Presence of other specified devices: Secondary | ICD-10-CM | POA: Diagnosis not present

## 2019-01-25 DIAGNOSIS — T17908A Unspecified foreign body in respiratory tract, part unspecified causing other injury, initial encounter: Secondary | ICD-10-CM | POA: Diagnosis present

## 2019-01-25 DIAGNOSIS — G9341 Metabolic encephalopathy: Secondary | ICD-10-CM

## 2019-01-25 LAB — CBC
HCT: 35.5 % — ABNORMAL LOW (ref 39.0–52.0)
Hemoglobin: 11.7 g/dL — ABNORMAL LOW (ref 13.0–17.0)
MCH: 35 pg — ABNORMAL HIGH (ref 26.0–34.0)
MCHC: 33 g/dL (ref 30.0–36.0)
MCV: 106.3 fL — ABNORMAL HIGH (ref 80.0–100.0)
Platelets: 241 10*3/uL (ref 150–400)
RBC: 3.34 MIL/uL — ABNORMAL LOW (ref 4.22–5.81)
RDW: 14.9 % (ref 11.5–15.5)
WBC: 13.4 10*3/uL — ABNORMAL HIGH (ref 4.0–10.5)
nRBC: 0 % (ref 0.0–0.2)

## 2019-01-25 LAB — BASIC METABOLIC PANEL
Anion gap: 9 (ref 5–15)
BUN: 23 mg/dL (ref 8–23)
CO2: 25 mmol/L (ref 22–32)
Calcium: 8.2 mg/dL — ABNORMAL LOW (ref 8.9–10.3)
Chloride: 108 mmol/L (ref 98–111)
Creatinine, Ser: 0.76 mg/dL (ref 0.61–1.24)
GFR calc Af Amer: >60 mL/min (ref >60)
GFR calc non Af Amer: >60 mL/min (ref >60)
Glucose, Bld: 111 mg/dL — ABNORMAL HIGH (ref 70–99)
Potassium: 3.4 mmol/L — ABNORMAL LOW (ref 3.5–5.1)
Sodium: 142 mmol/L (ref 135–145)

## 2019-01-25 LAB — GLUCOSE, CAPILLARY
Glucose-Capillary: 96 mg/dL (ref 70–99)
Glucose-Capillary: 127 mg/dL — ABNORMAL HIGH (ref 70–99)
Glucose-Capillary: 92 mg/dL (ref 70–99)
Glucose-Capillary: 73 mg/dL (ref 70–99)
Glucose-Capillary: 95 mg/dL (ref 70–99)

## 2019-01-25 LAB — HEPARIN LEVEL (UNFRACTIONATED): Heparin Unfractionated: <0.1 IU/mL — ABNORMAL LOW (ref 0.30–0.70)

## 2019-01-25 MED ORDER — INSULIN ASPART 100 UNIT/ML ~~LOC~~ SOLN
0.0000 [IU] | SUBCUTANEOUS | Status: DC
  Administered 2019-01-25 – 2019-01-31 (×10): 2 [IU] via SUBCUTANEOUS
  Administered 2019-01-31: 3 [IU] via SUBCUTANEOUS
  Administered 2019-02-01 – 2019-02-02 (×3): 2 [IU] via SUBCUTANEOUS
  Administered 2019-02-03: 3 [IU] via SUBCUTANEOUS
  Administered 2019-02-03 – 2019-02-04 (×2): 2 [IU] via SUBCUTANEOUS
  Administered 2019-02-04: 3 [IU] via SUBCUTANEOUS
  Administered 2019-02-05 – 2019-02-09 (×5): 2 [IU] via SUBCUTANEOUS
  Administered 2019-02-09 – 2019-02-12 (×3): 3 [IU] via SUBCUTANEOUS
  Administered 2019-02-12 – 2019-02-14 (×5): 2 [IU] via SUBCUTANEOUS

## 2019-01-25 MED ORDER — METOPROLOL TARTRATE 5 MG/5ML IV SOLN
5.0000 mg | INTRAVENOUS | Status: DC | PRN
  Administered 2019-01-25 – 2019-02-05 (×7): 5 mg via INTRAVENOUS
  Filled 2019-01-25 (×7): qty 5

## 2019-01-25 MED ORDER — SODIUM CHLORIDE 0.9 % IV SOLN
3.0000 g | Freq: Three times a day (TID) | INTRAVENOUS | Status: DC
  Administered 2019-01-25 – 2019-01-26 (×4): 3 g via INTRAVENOUS
  Filled 2019-01-25 (×4): qty 8

## 2019-01-25 MED ORDER — VALPROIC ACID 250 MG/5ML PO SOLN
500.0000 mg | Freq: Every day | ORAL | Status: DC
  Administered 2019-01-25 – 2019-01-29 (×5): 500 mg via ORAL
  Filled 2019-01-25 (×5): qty 10

## 2019-01-25 MED ORDER — RISPERIDONE 1 MG/ML PO SOLN
2.0000 mg | Freq: Every day | ORAL | Status: DC
  Administered 2019-01-25 – 2019-01-29 (×5): 2 mg
  Filled 2019-01-25 (×5): qty 2

## 2019-01-25 MED ORDER — CLONAZEPAM 0.5 MG PO TBDP
1.0000 mg | ORAL_TABLET | Freq: Two times a day (BID) | ORAL | Status: DC
  Administered 2019-01-25 – 2019-01-26 (×4): 1 mg
  Filled 2019-01-25 (×4): qty 2

## 2019-01-25 MED ORDER — RISPERIDONE 1 MG/ML PO SOLN
1.0000 mg | Freq: Every morning | ORAL | Status: DC
  Administered 2019-01-25 – 2019-01-29 (×5): 1 mg via ORAL
  Filled 2019-01-25 (×6): qty 1

## 2019-01-25 MED ORDER — METOPROLOL TARTRATE 25 MG/10 ML ORAL SUSPENSION
50.0000 mg | Freq: Two times a day (BID) | ORAL | Status: DC
  Administered 2019-01-25 – 2019-01-26 (×4): 50 mg
  Filled 2019-01-25 (×5): qty 20

## 2019-01-25 MED ORDER — POTASSIUM CHLORIDE 20 MEQ/15ML (10%) PO SOLN
40.0000 meq | Freq: Once | ORAL | Status: AC
  Administered 2019-01-25: 40 meq via ORAL
  Filled 2019-01-25: qty 30

## 2019-01-25 MED ORDER — HEPARIN (PORCINE) 25000 UT/250ML-% IV SOLN
1800.0000 [IU]/h | INTRAVENOUS | Status: AC
  Administered 2019-01-25: 15:00:00 950 [IU]/h via INTRAVENOUS
  Administered 2019-01-27: 1550 [IU]/h via INTRAVENOUS
  Administered 2019-01-28: 18:00:00 1650 [IU]/h via INTRAVENOUS
  Administered 2019-01-28: 02:00:00 1550 [IU]/h via INTRAVENOUS
  Filled 2019-01-25 (×5): qty 250

## 2019-01-25 NOTE — Progress Notes (Signed)
STROKE TEAM PROGRESS NOTE   INTERVAL HISTORY No family is at bedside. Pt has been off sedation. Able to open eyes on voice briefly but not following commands. Still intubated on vent. CCM planing for SBT and possible extubation once able. Slight movement to pain on LUE and BLEs, but not RUE. CT continued evolution of left MCA infarct, no new infarct. Tmax 100.4, put on unasyn. Post stroke day 7 today, will start heparin drip. Still in afib, but rate controlled.   OBJECTIVE Vitals:   01/25/19 1103 01/25/19 1115 01/25/19 1134 01/25/19 1200  BP:  115/75 115/75 93/68  Pulse:    77  Resp:    (!) 22  Temp: 100.1 F (37.8 C)     TempSrc: Oral     SpO2:    100%  Weight:        CBC:  Recent Labs  Lab 01/18/19 1923 01/18/19 1928 01/24/19 0400 01/25/19 0519  WBC 11.1*   < > 10.0 13.4*  NEUTROABS 6.8  --   --   --   HGB 15.1  --  12.4* 11.7*  HCT 43.7  --  38.2* 35.5*  MCV 103.3*   < > 108.5* 106.3*  PLT 244   < > 207 241   < > = values in this interval not displayed.    Basic Metabolic Panel:  Recent Labs  Lab 01/20/19 0713 01/20/19 0713 01/20/19 1700 01/24/19 2045 01/25/19 0519  NA 139   < >  --  144 142  K 4.0   < >  --  3.5 3.4*  CL 108   < >  --  111 108  CO2 22   < >  --  24 25  GLUCOSE 89   < >  --  132* 111*  BUN 16   < >  --  30* 23  CREATININE 0.93   < >  --  0.77 0.76  CALCIUM 8.7*   < >  --  8.6* 8.2*  MG 1.7  --  1.7 1.9  --   PHOS 3.1  --  2.5  --   --    < > = values in this interval not displayed.    Lipid Panel:     Component Value Date/Time   CHOL 238 (H) 01/19/2019 0437   TRIG 64 01/19/2019 0437   HDL 43 01/19/2019 0437   CHOLHDL 5.5 01/19/2019 0437   VLDL 13 01/19/2019 0437   LDLCALC 182 (H) 01/19/2019 0437   HgbA1c:  Lab Results  Component Value Date   HGBA1C 5.1 01/19/2019   Urine Drug Screen:     Component Value Date/Time   LABOPIA NONE DETECTED 07/13/2016 1428   COCAINSCRNUR NONE DETECTED 07/13/2016 1428   LABBENZ POSITIVE (A)  07/13/2016 1428   AMPHETMU NONE DETECTED 07/13/2016 1428   THCU POSITIVE (A) 07/13/2016 1428   LABBARB NONE DETECTED 07/13/2016 1428    Alcohol Level     Component Value Date/Time   ETH <5 02/09/2015 0010    IMAGING  CT Code Stroke CTA Head W/WO contrast CT Code Stroke CTA Neck W/WO contrast 01/18/2019 IMPRESSION:   Atherosclerotic disease at both carotid bifurcations but no stenosis.   Prominent soft plaque at the left ICA bulb.   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   Dominant right vertebral artery widely patent.   No intracranial large or medium vessel occlusion or correctable proximal stenosis, with specific attention  to the left MCA branches.   I do think there are a few distal MCA branches that lose opacification and there could be a few small emboli in the left MCA territory. Vessels marked with arrows.   Distal vessel atherosclerotic irregularity, particularly evident in the PCA branches.   Aortic Atherosclerosis (ICD10-I70.0)   Emphysema (ICD10-J43.9).   CT HEAD CODE STROKE WO CONTRAST 01/18/2019  IMPRESSION: 1. No acute finding by CT. Chronic small-vessel ischemic changes of the white matter. Old right cerebellar and right occipital infarctions.  2. ASPECTS is 10   CT HEAD WO CONTRAST 01/24/2019 IMPRESSION: Involving acute/subacute infarcts in the left frontal parietal lobe and left occipital lobe. Small amount of hemorrhage in the left posterior frontal infarct unchanged from prior CT and MRI. Electronically Signed   By: Franchot Gallo M.D.   On: 01/24/2019 16:18   MRI Wo Contrast  01/20/2019 IMPRESSION: 1. Multifocal acute ischemia within the left MCA territory, including the left postcentral gyrus. No hemorrhage or mass effect. 2. 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 3. Chronic ischemic microangiopathy.  CT Head Wo Contrast  01/19/2019 IMPRESSION: Interval demarcation  of an acute cortically based infarct within the lateral left frontal lobe as described. No acute intracranial hemorrhage or significant mass effect.  Transthoracic Echocardiogram  01/19/2019 IMPRESSIONS  1. Left ventricular ejection fraction, by visual estimation, is 55 to 60%. The left ventricle has normal function. There is moderately increased left ventricular hypertrophy.  2. The left ventricle demonstrates regional wall motion abnormalities.  3. Global right ventricle has normal systolic function.The right ventricular size is normal. No increase in right ventricular wall thickness.  4. Left atrial size was normal.  5. Right atrial size was mildly dilated.  6. The mitral valve is normal in structure. Mild to moderate mitral valve regurgitation.  7. The tricuspid valve is normal in structure. Tricuspid valve regurgitation is trivial.  8. The aortic valve is normal in structure. Aortic valve regurgitation is not visualized.  9. The pulmonic valve was normal in structure. Pulmonic valve regurgitation is not visualized. 10. The atrial septum is grossly normal.  ECG atrial fibrillation - ventricular response 133 BPM (See cardiology reading for complete details)  PHYSICAL EXAM   Blood pressure 93/68, pulse 77, temperature 100.1 F (37.8 C), temperature source Oral, resp. rate (!) 22, weight 83.8 kg, SpO2 100 %.  General - Well nourished, well developed, intubated off sedation.  Ophthalmologic - fundi not visualized due to noncooperation.  Cardiovascular - irregularly irregular heart rate and rhythm.  Neuro - intubated off sedation now, eyes closed, not following commands. Barely open eyes with pain stimulation. With forced eye opening, eyes in mid position, slight blinking to visual threat bilaterally, not tracking, pupil bilaterally 2 mm, sluggish to light. Corneal reflex weakly present L>R, gag and cough present.  Breathing over the vent.  Facial symmetry not able to test due to ET  tube. On pain stimulation, no movement of b/l upper extremities, slight toe movement at BLEs. Sensation, coordination and gait not tested.   ASSESSMENT/PLAN [name] is a 68 y.o. male with history of tobacco use, previous strokes, COPD, AAA, ASPVD, htn, hld, pulmonary nodule, depression, atrial fibrillation on coumadin but recently missed doses presenting with transient aphasia and leaning to the right. The patient received IV t-PA Thursday 01/18/19 @ 2000.  Stroke - left MCA infarct s/p t-PA - embolic pattern, likely due to atrial fibrillation with subtherapeutic INR  Resultant left gaze, right hemiparesis, right neglect and  agitation  Code Stroke CT Head - No acute finding by CT. Chronic small-vessel ischemic changes of the white matter. Old right cerebellar and right occipital infarctions. ASPECTS is 10   CT head - Interval demarcation of an acute cortically based infarct within the lateral left frontal lobe as described. No acute intracranial hemorrhage or significant mass effect.  MRI head -  Multifocal acute ischemia within the left MCA territory, including the left postcentral gyrus. No hemorrhage or mass effect. 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 Chronic ischemic microangiopathy.  CTA H&N - Prominent soft plaque at the left ICA bulb. 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   CT 12/16 stable, continued evolution of left MCA infarcts  2D Echo - EF 55 to 60%. No cardiac source of emboli identified.   Sars Corona Virus 2 - negative  LDL - 182  HgbA1c - 5.1  VTE prophylaxis -Heparin subq  warfarin daily prior to admission, now on ASA 325 mg daily. Will switch to heparin IV today.   Therapy recommendations:  Signed off, reorder once off vent  Disposition:  Pending  Afib with RVR  On amiodarone and Cardizem drip  Still has RVR due to agitation  On lopressor 25 bid (on 100 bid at  home) increase as needed    On warfarin PTA, INR 1.1 on admission  On aspirin for now  Will switch from ASA to heparin IV today  AMS with agitation, chronic benzo use Respiratory failure with hypoxia  Intubated off sedation now  Weaning off precedex and fentanyl and versed  On resperidone 1mg  bid, klonopin 0.5 bid, oxycodone 5 mb q6h  CXR Increased right pleural effusion with associated right basilar atelectasis or infiltrate  Now off sedation  Repeat CT head 12/16 stable evolution of left MCA infarcts, no new finding  CCM on board  Extubate as able  B12 deficiency  Vitamin B12 = 161  B12 supplement  History of stroke  11/2014, bilateral blurry vision after left upper extremity bypass surgery.  Found to have left central vision loss.  MRI showed bilateral embolic infarcts.  INR 1.45.  Carotid Doppler negative.  TTE unremarkable.  LDL 82, A1c 5.4.  Continued on discharge with Coumadin and Lipitor 40  hypotension Hx of hypertension  Home BP meds: Diltiazem, Lasix and metoprolol  Current BP meds clonidine 0.3 q 6h , metoprolol 25 bid  BP stable  Off neo . Avoid low BP  Hyperlipidemia  Home Lipid lowering medication: Lipitor 40 mg daily  LDL 182, goal < 70  Current lipid lowering medication: Lipitor 80 mg daily   Continue statin at discharge  Dysphagia  N.p.o.  Speech to follow once extubated  P.o. meds through tube  On bolus feed  Fever and leukocytosis  T-max 101.7->100.2->100.4  leucocytosis - 11.1->11.7->11.0->14.0->13.4->12.6->10.0->13.4  CXR with increased R pleural effusion w/ R basilar atx/infiltrate  UA neg 01/18/19  On unasyn   CCM on board  Tobacco abuse  Current smoker  Smoking cessation counseling will be provided  Pt is willing to quit  Other Stroke Risk Factors  Advanced age  ETOH use, advised to drink no more than 1 alcoholic beverage per day.  Other Active Problems  Hypokalemia  3.3->3.4->3.3->3.1->4.0->4.3->4.4->4.4->4.0->3.4   Hospital day # 7  This patient is critically ill and at significant risk of neurological worsening, death and care requires constant monitoring of vital signs, hemodynamics,respiratory and cardiac monitoring, extensive review of multiple databases, frequent neurological  assessment, discussion with family, other specialists and medical decision making of high complexity. I spent 35 minutes of neurocritical care time  in the care of  this patient. I have discussed with CCM NP Whitney.   Rosalin Hawking, MD PhD Stroke Neurology 01/25/2019 12:07 PM  To contact Stroke Continuity provider, please refer to [email or web address] After hours, contact General Neurology

## 2019-01-25 NOTE — Progress Notes (Signed)
ANTICOAGULATION CONSULT NOTE - Initial Consult  Pharmacy Consult for heparin Indication: atrial fibrillation and stroke  Allergies  Allergen Reactions  . Oxycodone Itching    Patient Measurements: Weight: 184 lb 11.9 oz (83.8 kg)  Vital Signs: Temp: 100.1 F (37.8 C) (12/17 1103) Temp Source: Oral (12/17 1103) BP: 93/68 (12/17 1200) Pulse Rate: 77 (12/17 1200)  Labs: Recent Labs    01/23/19 0411 01/24/19 0400 01/24/19 2045 01/25/19 0519  HGB 12.8* 12.4*  --  11.7*  HCT 39.4 38.2*  --  35.5*  PLT 196 207  --  241  CREATININE 1.02 0.84 0.77 0.76    CrCl cannot be calculated (Unknown ideal weight.).  Assessment: 68 yo m presenting with acute stroke - had been off his warfarin for afib for a while.  To initiate IV heparin today  CBC stable  Goal of Therapy:  Heparin level 0.3 - 0.5 units/ml; no boluses Monitor platelets by anticoagulation protocol: Yes   Plan:  Add heparin 950 units/hr Initial hep lvl 2100 Daily hep lvl cbc F/u plans to start [place], PharmD, BCPS, BCCCP Clinical Pharmacist [phone]  Please check AMION for all [place] numbers  01/25/2019 12:23 PM

## 2019-01-25 NOTE — Progress Notes (Signed)
[facility] for Heparin Indication: atrial fibrillation and stroke  Allergies  Allergen Reactions  . Oxycodone Itching    Patient Measurements: Weight: 184 lb 11.9 oz (83.8 kg)  Vital Signs: Temp: 99.1 F (37.3 C) (12/17 2002) Temp Source: Oral (12/17 2002) BP: 118/72 (12/17 2200) Pulse Rate: 74 (12/17 2200)  Labs: Recent Labs    01/23/19 0411 01/24/19 0400 01/24/19 2045 01/25/19 0519 01/25/19 2153  HGB 12.8* 12.4*  --  11.7*  --   HCT 39.4 38.2*  --  35.5*  --   PLT 196 207  --  241  --   HEPARINUNFRC  --   --   --   --  <0.10*  CREATININE 1.02 0.84 0.77 0.76  --     CrCl cannot be calculated (Unknown ideal weight.).  Assessment: 68 yo m presenting with acute stroke - had been off his warfarin for afib for a while.  To initiate IV heparin today  CBC stable  12/17 PM update:  Heparin level undetectable  Goal of Therapy:  Heparin level 0.3 - 0.5 units/ml; no boluses Monitor platelets by anticoagulation protocol: Yes   Plan:  Inc heparin to 1100 units/hr Re-check heparin level in 8 hours  Narda Bonds, PharmD, [place] Pharmacist Phone: [phone]

## 2019-01-25 NOTE — Progress Notes (Signed)
SLP Cancellation Note  Patient Details Name: [name] MRN: [id number] DOB: August 12, 1950   Cancelled treatment:        Remains on vent support. Will follow   [name] 01/25/2019, 7:33 AM

## 2019-01-25 NOTE — Progress Notes (Signed)
NAME:  [name], MRN:  [id number], DOB:  [birth year], LOS: 7 ADMISSION DATE:  01/18/2019, CONSULTATION DATE:  01/18/19 REFERRING MD:  Leonel Ramsay, CHIEF COMPLAINT:  Unable to elicit   Brief History   64yM with hypertensive emergency in setting of acute L MCA stroke s/p tPA, AF/RVR  Has been noncompliant with his warfarin for A. Fib. Code stroke activated, TPA administered. Intubated for respiratory failure. PCCM consulted for vent management  Past Medical History  AF COPD CVA PAD  Significant Hospital Events   12/10 tPA administered at 1946 12/11 intubated  Consults:  PCCM  Procedures:  none  Significant Diagnostic Tests:  CTA h/n 12/10 > distal MCA branches that lose opacification and there could be a few small emboli in the left MCA territory.  CT head 12/11 > Interval demarcation of an acute cortically based infarct within the lateral left frontal lobe as described. No acute intracranial hemorrhage or significant mass effect.  MRI brain 12/12 >  1. Multifocal acute ischemia within the left MCA territory, including the left postcentral gyrus. No hemorrhage or mass effect. 2. 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 3. Chronic ischemic microangiopathy.  Micro Data:  Blood 12/10 > NGTD Urine 12/10 > insignificant growth  MRSA surveillance 12/11 > negative  SARS Coronavirus 2 12/11 > negative  Antimicrobials:  None  Interim history/subjective:  No reported events overnight, more awake and interactive this morning, able to follow simple commands.    Objective   Blood pressure 127/77, pulse (!) 122, temperature 99.3 F (37.4 C), temperature source Oral, resp. rate (!) 34, weight 83.8 kg, SpO2 98 %.    Vent Mode: CPAP;PSV FiO2 (%):  [30 %] 30 % Set Rate:  [12 bmp] 12 bmp Vt Set:  [540 mL] 540 mL PEEP:  [5 cmH20] 5 cmH20 Pressure Support:  [12 cmH20] 12 cmH20 Plateau Pressure:  [12 cmH20-20 cmH20] 16 cmH20   Intake/Output Summary (Last 24 hours)  at 01/25/2019 [id number] Last data filed at 01/25/2019 0700 Gross per 24 hour  Intake 1653.15 ml  Output 1345 ml  Net 308.15 ml   Filed Weights   01/23/19 0412 01/24/19 0300 01/25/19 0500  Weight: 77.7 kg 80.8 kg 83.8 kg    Examination: General: Chronically ill appearing elderly male/male on mechanical ventilation, in NAD HEENT: ETT, MM pink/moist, PERRL, ecchymosis of right eye from traums Neuro: awake and interactive this morning, able to squeeze for grip on left but not right  CV: s1s2 regular rate and rhythm, no murmur, rubs, or gallops,  PULM:  Clear to left, rhonchi on right improved from day prior, tolerating vent  GI: soft, bowel sounds active in all 4 quadrants, non-tender, non-distended, tolerating TF Extremities: warm/dry, no edema  Skin: no rashes or lesions  Resolved Hospital Problem list   Hypertensive emergency   Assessment & Plan:  Acute stroke with distal branch occlusions of left MCA -Status post tPA Plan: Primary management per neurology   Acute respiratory failure due to CVA Plan: Improved mentation today with decreased sedation, attempt SBT If able to tolerate ABT possible extubation today  Wean PEEP and FiO2 for sats greater than 90%. Head of bed elevated 30 degrees. Plateau pressures less than 30 cm H20.  Follow intermittent chest x-ray and ABG.   Ensure adequate pulmonary hygiene  Follow cultures  VAP bundle in place  PAD protocol  VAP -Developed low grade fever overnight 12/15 with worsening right sided opacities on CXR sputum culture 12/16 with abundant gram positive cocci.  Plan:  Initiate IV Unasyn  Follow cultures SLP eval once extubated   Agitation Plan: Minimize IV sedation Increase Risperidone and klonipin Start low dose Valproic acid per discussion with attending   Atrial fibrillation with RVR -now rate contolled Plan: Continue PO Cardizem and Amiodarone  Increase PO lopressor Subq heparin   Chronic benzodiazepine  use Plan: Precedex drip stopped 12/16 Continue Risperidone and klonopin    Best practice:  Diet: N.p.o. TF per recs  Pain/Anxiety/Delirium protocol (if indicated): Precedex, fentanyl, versed  VAP protocol (if indicated): In place DVT prophylaxis: SCDs GI prophylaxis: PPI  Glucose control: has not required coverage. D/c accu checks. Hypoglycemia protocol  Mobility: Bedrest Code Status: Full code Family Communication: Per primary Disposition:  ICU  Labs and imaging for past 48 hours reviewed in EMR    Critical care:   Performed by: Johnsie Cancel   Total critical care time: 40 minutes  Critical care time was exclusive of separately billable procedures and treating other patients.  Critical care was necessary to treat or prevent imminent or life-threatening deterioration.  Critical care was time spent personally by me on the following activities: development of treatment plan with patient and/or surrogate as well as nursing, discussions with consultants, evaluation of patient's response to treatment, examination of patient, obtaining history from patient or surrogate, ordering and performing treatments and interventions, ordering and review of laboratory studies, ordering and review of radiographic studies, pulse oximetry and re-evaluation of patient's condition.  Johnsie Cancel, NP-C [facility] Contact / Pager information can be found on Amion  01/25/2019, 9:17 AM

## 2019-01-25 NOTE — Progress Notes (Addendum)
Nutrition Follow-up  DOCUMENTATION CODES:   Not applicable  INTERVENTION:   -Continue tube feeding of Osmolite 1.5 bolus of 260ml (1 carton) 4 times per day with Prostat 12ml TID.   -Pt will need SLP evaluation once extubated prior to diet to diet advancement  NUTRITION DIAGNOSIS:   Inadequate oral intake related to acute illness as evidenced by NPO status.  Continued  GOAL:   Patient will meet greater than or equal to 90% of their needs  Continued  MONITOR:   Vent status, TF tolerance, Labs, Weight trends  REASON FOR ASSESSMENT:   Consult, Ventilator Enteral/tube feeding initiation and management  ASSESSMENT:   68 yo male admitted with hypertensive emergency in setting of acute L MCA stroke, required intubation for airway protection, pt choking on own secretions. PMH includes COPD, AF, CVA, PAD.  Current wt 83.8 kg. Wt is trending up. Pt is +10L since admit.   Pt has OG tube.  Pt is currently intubated and on vent support  MV: 12.2 Temp (24hrs), Avg:99.1 F (37.3 C), Min:98.4 F (36.9 C), Max:100.1 F (37.8 C)  Medications: catapres, cardizem, senokot, vitamin b-12, 0.9% sodium chloride, unasyn  Labs: CBGs 71-140, Potassium 3.4, Calcium 8.2  Diet Order:   Diet Order            Diet NPO time specified  Diet effective now              EDUCATION NEEDS:   Not appropriate for education at this time  Skin:  Skin Assessment: Reviewed RN Assessment  Last BM:  12/17  Height:   Ht Readings from Last 1 Encounters:  07/17/16 5\' 8"  (1.727 m)    Weight:   Wt Readings from Last 1 Encounters:  01/25/19 83.8 kg    Ideal Body Weight:  70 kg  BMI:  Body mass index is 28.09 kg/m.  Estimated Nutritional Needs:   Kcal:  2026  Protein:  100-120  Fluid:  >2L    Allen Norris Dietetic Intern Pager # [phone]

## 2019-01-26 LAB — CBC
HCT: 34.9 % — ABNORMAL LOW (ref 39.0–52.0)
Hemoglobin: 11.4 g/dL — ABNORMAL LOW (ref 13.0–17.0)
MCH: 35.2 pg — ABNORMAL HIGH (ref 26.0–34.0)
MCHC: 32.7 g/dL (ref 30.0–36.0)
MCV: 107.7 fL — ABNORMAL HIGH (ref 80.0–100.0)
Platelets: 300 10*3/uL (ref 150–400)
RBC: 3.24 MIL/uL — ABNORMAL LOW (ref 4.22–5.81)
RDW: 15.2 % (ref 11.5–15.5)
WBC: 7.8 10*3/uL (ref 4.0–10.5)
nRBC: 0 % (ref 0.0–0.2)

## 2019-01-26 LAB — CULTURE, RESPIRATORY W GRAM STAIN

## 2019-01-26 LAB — GLUCOSE, CAPILLARY
Glucose-Capillary: 85 mg/dL (ref 70–99)
Glucose-Capillary: 83 mg/dL (ref 70–99)
Glucose-Capillary: 118 mg/dL — ABNORMAL HIGH (ref 70–99)
Glucose-Capillary: 77 mg/dL (ref 70–99)
Glucose-Capillary: 108 mg/dL — ABNORMAL HIGH (ref 70–99)
Glucose-Capillary: 107 mg/dL — ABNORMAL HIGH (ref 70–99)
Glucose-Capillary: 117 mg/dL — ABNORMAL HIGH (ref 70–99)

## 2019-01-26 LAB — BASIC METABOLIC PANEL
Anion gap: 10 (ref 5–15)
BUN: 22 mg/dL (ref 8–23)
CO2: 25 mmol/L (ref 22–32)
Calcium: 8.3 mg/dL — ABNORMAL LOW (ref 8.9–10.3)
Chloride: 110 mmol/L (ref 98–111)
Creatinine, Ser: 0.86 mg/dL (ref 0.61–1.24)
GFR calc Af Amer: >60 mL/min (ref >60)
GFR calc non Af Amer: >60 mL/min (ref >60)
Glucose, Bld: 100 mg/dL — ABNORMAL HIGH (ref 70–99)
Potassium: 3.7 mmol/L (ref 3.5–5.1)
Sodium: 145 mmol/L (ref 135–145)

## 2019-01-26 LAB — HEPARIN LEVEL (UNFRACTIONATED)
Heparin Unfractionated: <0.1 IU/mL — ABNORMAL LOW (ref 0.30–0.70)
Heparin Unfractionated: <0.1 IU/mL — ABNORMAL LOW (ref 0.30–0.70)

## 2019-01-26 MED ORDER — SODIUM CHLORIDE 0.9 % IV SOLN
2.0000 g | Freq: Three times a day (TID) | INTRAVENOUS | Status: AC
  Administered 2019-01-26 – 2019-02-02 (×21): 2 g via INTRAVENOUS
  Filled 2019-01-26 (×21): qty 2

## 2019-01-26 MED ORDER — NYSTATIN 100000 UNIT/ML MT SUSP
5.0000 mL | Freq: Three times a day (TID) | OROMUCOSAL | Status: DC
  Administered 2019-01-26 – 2019-02-14 (×77): 500000 [IU] via ORAL
  Filled 2019-01-26 (×70): qty 5

## 2019-01-26 NOTE — Progress Notes (Addendum)
NAME:  [name], MRN:  [id number], DOB:  1950/05/01, LOS: 8 ADMISSION DATE:  01/18/2019, CONSULTATION DATE:  01/18/19 REFERRING MD:  [name], CHIEF COMPLAINT:  Unable to elicit   Brief History   [name] with hypertensive emergency in setting of acute L MCA stroke s/p tPA, AF/RVR  Has been noncompliant with his warfarin for A. Fib. Code stroke activated, TPA administered. Intubated for respiratory failure. PCCM consulted for vent management  Past Medical History  AF COPD CVA PAD  Significant Hospital Events   12/10 tPA administered at 1946 12/11 intubated  Consults:  PCCM  Procedures:  None  Significant Diagnostic Tests:  CTA h/n 12/10 > distal MCA branches that lose opacification and there could be a few small emboli in the left MCA territory.  CT head 12/11 > Interval demarcation of an acute cortically based infarct within the lateral left frontal lobe as described. No acute intracranial hemorrhage or significant mass effect.  MRI brain 12/12 >  1. Multifocal acute ischemia within the left MCA territory, including the left postcentral gyrus. No hemorrhage or mass effect. 2. 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 3. Chronic ischemic microangiopathy.  Micro Data:  Blood 12/10 > NGTD Urine 12/10 > insignificant growth  MRSA surveillance 12/11 > negative  SARS Coronavirus 2 12/11 > negative  Antimicrobials:  None  Interim history/subjective:  No acute events overnight, remains more alert this morning. Currently tolerating wean well.   Objective   Blood pressure 106/69, pulse (!) 104, temperature 100 F (37.8 C), temperature source Axillary, resp. rate 19, weight 85.3 kg, SpO2 99 %.    Vent Mode: PSV;CPAP FiO2 (%):  [30 %] 30 % Set Rate:  [12 bmp] 12 bmp Vt Set:  [540 mL] 540 mL PEEP:  [5 cmH20] 5 cmH20 Pressure Support:  [8 [id number] cmH20] 8 cmH20 Plateau Pressure:  [14 cmH20-18 cmH20] 15 cmH20   Intake/Output Summary (Last 24 hours) at  01/26/2019 0805 Last data filed at 01/26/2019 0700 Gross per 24 hour  Intake 1622.34 ml  Output 400 ml  Net 1222.34 ml   Filed Weights   01/24/19 0300 01/25/19 0500 01/26/19 0247  Weight: 80.8 kg 83.8 kg 85.3 kg    Examination: General: Chronically ill appearing elderly male on mechanical ventilation, in NAD HEENT: ETT, MM pink/moist, PERRL, white coating over tongue  Neuro: Sleepy on ventilator but will arouse, will follow very simple commands, no movement to right upper extremity  CV: s1s2 regular rate and rhythm, no murmur, rubs, or gallops,  PULM:  Clear to ascultation, slightly diminished to right base  GI: soft, bowel sounds active in all 4 quadrants, non-tender, non-distended, tolerating TF Extremities: warm/dry, no edema  Skin: no rashes or lesions  Resolved Hospital Problem list   Hypertensive emergency   Assessment & Plan:  Acute stroke with distal branch occlusions of left MCA -Status post tPA Plan: Management per Neuro   Acute respiratory failure due to CVA Plan: Mentation continue to improve and currently tolerating wean Likely can extubate today  Head of bed 30 degrees Follow intermittent CXR and ABG VAP bundle in place PAD protocol   VAP -Developed low grade fever overnight 12/15 with worsening right sided opacities on CXR sputum culture 12/16 with abundant gram positive cocci.  Plan:  Continue to follow formal culture results  Continue IV antibiotics  Frequent oral care  SLP once able to tolerate oral diet   Agitation, improved  Plan: Continue currently medication regiment  Minimize IV sedation as able  Low dose Valproric acid started 12/17 for further assist in management of agitation   Atrial fibrillation with RVR -now rate contolled Plan: Continue PO Cardizem, lopressor,  and Amiodarone IV heparin resumes 12/17 Continuous telemetry   Chronic benzodiazepine use Plan: Continue Risperidone and Klonopin   Oral thrush Plan: Nystatin  suspension  Continue frequent oral care   Best practice:  Diet: N.p.o. TF per recs  Pain/Anxiety/Delirium protocol (if indicated): Precedex, fentanyl, versed  VAP protocol (if indicated): In place DVT prophylaxis: SCDs GI prophylaxis: PPI  Glucose control: has not required coverage. D/c accu checks. Hypoglycemia protocol  Mobility: Bedrest Code Status: Full code Family Communication: Per primary Disposition:  ICU  Labs and imaging for past 48 hours reviewed in EMR    Critical care:   Performed by: [name]   Total critical care time: 37 minutes  Critical care time was exclusive of separately billable procedures and treating other patients.  Critical care was necessary to treat or prevent imminent or life-threatening deterioration.  Critical care was time spent personally by me on the following activities: development of treatment plan with patient and/or surrogate as well as nursing, discussions with consultants, evaluation of patient's response to treatment, examination of patient, obtaining history from patient or surrogate, ordering and performing treatments and interventions, ordering and review of laboratory studies, ordering and review of radiographic studies, pulse oximetry and re-evaluation of patient's condition.  [name] Cancel, NP-C [facility] Contact / Pager information can be found on Amion  01/26/2019, 8:05 AM

## 2019-01-26 NOTE — Progress Notes (Signed)
ANTICOAGULATION CONSULT NOTE - Initial Consult  Pharmacy Consult for heparin Indication: atrial fibrillation and stroke  Allergies  Allergen Reactions  . Oxycodone Itching    Patient Measurements: Weight: 188 lb 0.8 oz (85.3 kg)  Vital Signs: Temp: 100 F (37.8 C) (12/18 0700) Temp Source: Axillary (12/18 0700) BP: 125/70 (12/18 0923) Pulse Rate: 104 (12/18 0730)  Labs: Recent Labs    01/24/19 0400 01/24/19 2045 01/25/19 0519 01/25/19 2153 01/26/19 0522 01/26/19 0730  HGB 12.4*  --  11.7*  --  11.4*  --   HCT 38.2*  --  35.5*  --  34.9*  --   PLT 207  --  241  --  300  --   HEPARINUNFRC  --   --   --  <0.10*  --  <0.10*  CREATININE 0.84 0.77 0.76  --  0.86  --     CrCl cannot be calculated (Unknown ideal weight.).  Assessment: 68 yo m presenting with acute stroke - had been off his warfarin for afib for a while.  Hep has been running without issues but 2nd lvl in a row still undetecable  CBC stable  Goal of Therapy:  Heparin level 0.3 - 0.5 units/ml; no boluses Monitor platelets by anticoagulation protocol: Yes   Plan:  Increase heparin to 1300 units/hr 1800 HL F/u plans to start [place], PharmD, BCPS, BCCCP Clinical Pharmacist [phone]  Please check AMION for all [place] numbers  01/26/2019 10:11 AM

## 2019-01-26 NOTE — Progress Notes (Signed)
[place] for heparin Indication: atrial fibrillation and stroke  Allergies  Allergen Reactions  . Oxycodone Itching    Patient Measurements: Weight: 188 lb 0.8 oz (85.3 kg)  Vital Signs: Temp: 100.5 F (38.1 C) (12/18 1919) Temp Source: Axillary (12/18 1919) BP: 110/66 (12/18 1913) Pulse Rate: 93 (12/18 1913)  Labs: Recent Labs    01/24/19 0400 01/24/19 2045 01/25/19 0519 01/25/19 2153 01/26/19 0522 01/26/19 0730 01/26/19 1832  HGB 12.4*  --  11.7*  --  11.4*  --   --   HCT 38.2*  --  35.5*  --  34.9*  --   --   PLT 207  --  241  --  300  --   --   HEPARINUNFRC  --   --   --  <0.10*  --  <0.10* <0.10*  CREATININE 0.84 0.77 0.76  --  0.86  --   --     CrCl cannot be calculated (Unknown ideal weight.).  Assessment: 68 yo m presenting with acute stroke - had been off his warfarin for afib for a while.  Heparin level remains undetectable despite rate increase to 1300 units/hr (~15unit/kg/hr).  CBC stable  Goal of Therapy:  Heparin level 0.3 - 0.5 units/ml; no boluses Monitor platelets by anticoagulation protocol: Yes   Plan:  Increase heparin to 1550 units/hr 0200 HL F/u plans to start [place], PharmD, BCPS, BCCCP Clinical Pharmacist Please refer to [facility] for [place] numbers 01/26/2019 7:48 PM

## 2019-01-26 NOTE — Progress Notes (Signed)
STROKE TEAM PROGRESS NOTE   INTERVAL HISTORY Pt still intubated. As per note, pt not tolerating SBT, became tachypnea and needed bolus sedation. Currently, pt on vent, not following commands, on Abx and heparin IV. Not able to extubate today. CCM following.    OBJECTIVE Vitals:   01/26/19 0600 01/26/19 0700 01/26/19 0730 01/26/19 0923  BP: 131/86 106/69  125/70  Pulse: 96 94 (!) 104   Resp: (!) 21 19 19    Temp:  100 F (37.8 C)    TempSrc:  Axillary    SpO2: 95% 99% 99%   Weight:        CBC:  Recent Labs  Lab 01/25/19 0519 01/26/19 0522  WBC 13.4* 7.8  HGB 11.7* 11.4*  HCT 35.5* 34.9*  MCV 106.3* 107.7*  PLT 241 [id number]    Basic Metabolic Panel:  Recent Labs  Lab 01/20/19 0713 01/20/19 0713 01/20/19 1700 01/24/19 2045 01/25/19 0519 01/26/19 0522  NA 139   < >  --  144 142 145  K 4.0   < >  --  3.5 3.4* 3.7  CL 108   < >  --  111 108 110  CO2 22   < >  --  24 25 25   GLUCOSE 89   < >  --  132* 111* 100*  BUN 16   < >  --  30* 23 22  CREATININE 0.93   < >  --  0.77 0.76 0.86  CALCIUM 8.7*   < >  --  8.6* 8.2* 8.3*  MG 1.7  --  1.7 1.9  --   --   PHOS 3.1  --  2.5  --   --   --    < > = values in this interval not displayed.    Lipid Panel:     Component Value Date/Time   CHOL 238 (H) 01/19/2019 0437   TRIG 64 01/19/2019 0437   HDL 43 01/19/2019 0437   CHOLHDL 5.5 01/19/2019 0437   VLDL 13 01/19/2019 0437   LDLCALC 182 (H) 01/19/2019 0437   HgbA1c:  Lab Results  Component Value Date   HGBA1C 5.1 01/19/2019   Urine Drug Screen:     Component Value Date/Time   LABOPIA NONE DETECTED 07/13/2016 1428   COCAINSCRNUR NONE DETECTED 07/13/2016 1428   LABBENZ POSITIVE (A) 07/13/2016 1428   AMPHETMU NONE DETECTED 07/13/2016 1428   THCU POSITIVE (A) 07/13/2016 1428   LABBARB NONE DETECTED 07/13/2016 1428    Alcohol Level     Component Value Date/Time   ETH <5 02/09/2015 0010    IMAGING  CT HEAD WO CONTRAST 01/24/2019 IMPRESSION: Involving  acute/subacute infarcts in the left frontal parietal lobe and left occipital lobe. Small amount of hemorrhage in the left posterior frontal infarct unchanged from prior CT and MRI. Electronically Signed   By: Franchot Gallo M.D.   On: 01/24/2019 16:18   MRI Wo Contrast  01/20/2019 IMPRESSION: 1. Multifocal acute ischemia within the left MCA territory, including the left postcentral gyrus. No hemorrhage or mass effect. 2. 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 3. Chronic ischemic microangiopathy.  CT Head Wo Contrast  01/19/2019 IMPRESSION: Interval demarcation of an acute cortically based infarct within the lateral left frontal lobe as described. No acute intracranial hemorrhage or significant mass effect.  CT Code Stroke CTA Head W/WO contrast CT Code Stroke CTA Neck W/WO contrast 01/18/2019 IMPRESSION:   Atherosclerotic disease at both carotid bifurcations but no stenosis.   Prominent soft  plaque at the left ICA bulb.   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   Dominant right vertebral artery widely patent.   No intracranial large or medium vessel occlusion or correctable proximal stenosis, with specific attention to the left MCA branches.   I do think there are a few distal MCA branches that lose opacification and there could be a few small emboli in the left MCA territory. Vessels marked with arrows.   Distal vessel atherosclerotic irregularity, particularly evident in the PCA branches.   Aortic Atherosclerosis (ICD10-I70.0)   Emphysema (ICD10-J43.9).   CT HEAD CODE STROKE WO CONTRAST 01/18/2019  IMPRESSION: 1. No acute finding by CT. Chronic small-vessel ischemic changes of the white matter. Old right cerebellar and right occipital infarctions.  2. ASPECTS is 10   Transthoracic Echocardiogram  01/19/2019 IMPRESSIONS  1. Left ventricular ejection fraction, by visual estimation, is 55 to 60%.  The left ventricle has normal function. There is moderately increased left ventricular hypertrophy.  2. The left ventricle demonstrates regional wall motion abnormalities.  3. Global right ventricle has normal systolic function.The right ventricular size is normal. No increase in right ventricular wall thickness.  4. Left atrial size was normal.  5. Right atrial size was mildly dilated.  6. The mitral valve is normal in structure. Mild to moderate mitral valve regurgitation.  7. The tricuspid valve is normal in structure. Tricuspid valve regurgitation is trivial.  8. The aortic valve is normal in structure. Aortic valve regurgitation is not visualized.  9. The pulmonic valve was normal in structure. Pulmonic valve regurgitation is not visualized. 10. The atrial septum is grossly normal.  ECG atrial fibrillation - ventricular response 133 BPM (See cardiology reading for complete details)  PHYSICAL EXAM  Blood pressure 125/70, pulse (!) 104, temperature 100 F (37.8 C), temperature source Axillary, resp. rate 19, weight 85.3 kg, SpO2 99 %.  General - Well nourished, well developed, intubated just s/p bolus sedation.  Ophthalmologic - fundi not visualized due to noncooperation.  Cardiovascular - irregularly irregular heart rate and rhythm.  Neuro - intubated just received bolus sedation, eyes closed, not following commands. Barely open eyes with pain stimulation. With forced eye opening, pt resisted eye opening bilaterally, eyes in mid position, not blinking to visual threat bilaterally, not tracking, pupil bilaterally 2 mm, sluggish to light. Doll's eyes sluggish, corneal reflex weakly present L>R, gag and cough present.  Breathing over the vent.  Facial symmetry not able to test due to ET tube. On pain stimulation, no movement of b/l upper extremities, slight toe and leg movement at BLEs. Sensation, coordination and gait not tested.   ASSESSMENT/PLAN [name] is a 68 y.o.  male with history of tobacco use, previous strokes, COPD, AAA, ASPVD, htn, hld, pulmonary nodule, depression, atrial fibrillation on coumadin but recently missed doses presenting with transient aphasia and leaning to the right. The patient received IV t-PA Thursday 01/18/19 @ 2000.  Stroke - left MCA infarct s/p t-PA - embolic pattern, likely due to atrial fibrillation with subtherapeutic INR  Resultant left gaze, right hemiparesis, right neglect and agitation  Code Stroke CT Head - No acute finding by CT. Chronic small-vessel ischemic changes of the white matter. Old right cerebellar and right occipital infarctions. ASPECTS is 10   CT head - Interval demarcation of an acute cortically based infarct within the lateral left frontal lobe as described. No acute intracranial hemorrhage or significant mass effect.  MRI head -  Multifocal acute ischemia within the left MCA territory, including the left postcentral gyrus. No hemorrhage or mass effect. 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 Chronic ischemic microangiopathy.  CTA H&N - Prominent soft plaque at the left ICA bulb. 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   CT 12/16 stable, continued evolution of left MCA infarcts  2D Echo - EF 55 to 60%. No cardiac source of emboli identified.   [employer] Virus 2 - negative  LDL - 182  HgbA1c - 5.1  VTE prophylaxis -Heparin subq  warfarin daily prior to admission, now on heparin IV  Therapy recommendations:  Signed off, reorder once off vent  Disposition:  Pending  Afib with RVR  On amiodarone and Cardizem drip  Still has RVR due to agitation  On lopressor 25 bid (on 100 bid at home) increase as needed    On warfarin PTA, INR 1.1 on admission  On aspirin for now  On heparin IV  AMS with agitation, chronic benzo use Respiratory failure with hypoxia  Intubated   Weaned off precedex and fentanyl and  versed  On resperidone 1mg  bid, klonopin 0.5 bid, oxycodone 5 mb q6h  CXR Increased right pleural effusion with associated right basilar atelectasis or infiltrate  Tachypnea on SBT received bolus sedation  CCM on board  Extubate as able  B12 deficiency  Vitamin B12 = 161  B12 supplement  History of stroke  11/2014, bilateral blurry vision after left upper extremity bypass surgery.  Found to have left central vision loss.  MRI showed bilateral embolic infarcts.  INR 1.45.  Carotid Doppler negative.  TTE unremarkable.  LDL 82, A1c 5.4.  Continued on discharge with Coumadin and Lipitor 40  hypotension Hx of hypertension  Home BP meds: Diltiazem, Lasix and metoprolol  Current BP meds clonidine 0.3 q 6h , metoprolol 25 bid  BP stable  Off neo . Avoid low BP  Hyperlipidemia  Home Lipid lowering medication: Lipitor 40 mg daily  LDL 182, goal < 70  Current lipid lowering medication: Lipitor 80 mg daily   Continue statin at discharge  Dysphagia  N.p.o.  Speech to follow once extubated  P.o. meds through tube  On bolus feed  VAP Fever and leukocytosis  T-max 101.7->100.2->100.4->101.5  leucocytosis - 11.1->11.7->11.0->14.0->13.4->12.6->10.0->13.4->7.8  CXR with increased R pleural effusion w/ R basilar atx/infiltrate  UA neg 01/18/19  On cefepime for S.pneumoniae and E.cloacae in sputum  CCM on board  Tobacco abuse  Current smoker  Smoking cessation counseling will be provided  Pt is willing to quit  Other Stroke Risk Factors  Advanced age  ETOH use, advised to drink no more than 1 alcoholic beverage per day.  Other Active Problems  Hypokalemia 3.3->3.4->3.3->3.1->4.0->4.3->4.4->4.4->4.0->3.4->3.7  Oral thrush on nystatin   Hospital day # 8  This patient is critically ill and at significant risk of neurological worsening, death and care requires constant monitoring of vital signs, hemodynamics,respiratory and cardiac monitoring,  extensive review of multiple databases, frequent neurological assessment, discussion with family, other specialists and medical decision making of high complexity. I spent 35 minutes of neurocritical care time  in the care of  this patient.   Rosalin Hawking, MD PhD Stroke Neurology 01/26/2019 10:32 AM  To contact Stroke Continuity provider, please refer to [email or web address] After hours, contact General Neurology

## 2019-01-27 DIAGNOSIS — J690 Pneumonitis due to inhalation of food and vomit: Secondary | ICD-10-CM

## 2019-01-27 DIAGNOSIS — R410 Disorientation, unspecified: Secondary | ICD-10-CM

## 2019-01-27 LAB — CBC
HCT: 36.9 % — ABNORMAL LOW (ref 39.0–52.0)
Hemoglobin: 11.9 g/dL — ABNORMAL LOW (ref 13.0–17.0)
MCH: 34.4 pg — ABNORMAL HIGH (ref 26.0–34.0)
MCHC: 32.2 g/dL (ref 30.0–36.0)
MCV: 106.6 fL — ABNORMAL HIGH (ref 80.0–100.0)
Platelets: 357 10*3/uL (ref 150–400)
RBC: 3.46 MIL/uL — ABNORMAL LOW (ref 4.22–5.81)
RDW: 15.2 % (ref 11.5–15.5)
WBC: 9 10*3/uL (ref 4.0–10.5)
nRBC: 0 % (ref 0.0–0.2)

## 2019-01-27 LAB — GLUCOSE, CAPILLARY
Glucose-Capillary: 91 mg/dL (ref 70–99)
Glucose-Capillary: 80 mg/dL (ref 70–99)
Glucose-Capillary: 126 mg/dL — ABNORMAL HIGH (ref 70–99)
Glucose-Capillary: 122 mg/dL — ABNORMAL HIGH (ref 70–99)
Glucose-Capillary: 150 mg/dL — ABNORMAL HIGH (ref 70–99)

## 2019-01-27 LAB — BASIC METABOLIC PANEL
Anion gap: 9 (ref 5–15)
BUN: 21 mg/dL (ref 8–23)
CO2: 24 mmol/L (ref 22–32)
Calcium: 8.7 mg/dL — ABNORMAL LOW (ref 8.9–10.3)
Chloride: 111 mmol/L (ref 98–111)
Creatinine, Ser: 0.95 mg/dL (ref 0.61–1.24)
GFR calc Af Amer: >60 mL/min (ref >60)
GFR calc non Af Amer: >60 mL/min (ref >60)
Glucose, Bld: 140 mg/dL — ABNORMAL HIGH (ref 70–99)
Potassium: 4.4 mmol/L (ref 3.5–5.1)
Sodium: 144 mmol/L (ref 135–145)

## 2019-01-27 LAB — HEPARIN LEVEL (UNFRACTIONATED)
Heparin Unfractionated: 0.39 IU/mL (ref 0.30–0.70)
Heparin Unfractionated: 0.55 IU/mL (ref 0.30–0.70)
Heparin Unfractionated: 0.23 IU/mL — ABNORMAL LOW (ref 0.30–0.70)

## 2019-01-27 MED ORDER — CLONAZEPAM 0.5 MG PO TABS
0.5000 mg | ORAL_TABLET | Freq: Every day | ORAL | Status: DC
  Administered 2019-01-27 – 2019-01-28 (×2): 0.5 mg via ORAL
  Filled 2019-01-27 (×2): qty 1

## 2019-01-27 MED ORDER — CLONIDINE HCL 0.2 MG PO TABS
0.1000 mg | ORAL_TABLET | Freq: Every day | ORAL | Status: AC
  Administered 2019-01-30: 10:00:00 0.1 mg via ORAL
  Filled 2019-01-27: qty 1

## 2019-01-27 MED ORDER — CLONAZEPAM 0.5 MG PO TBDP
1.0000 mg | ORAL_TABLET | Freq: Every day | ORAL | Status: DC
  Administered 2019-01-27 – 2019-01-28 (×2): 1 mg
  Filled 2019-01-27 (×2): qty 2

## 2019-01-27 MED ORDER — CLONIDINE HCL 0.2 MG PO TABS
0.2000 mg | ORAL_TABLET | Freq: Four times a day (QID) | ORAL | Status: AC
  Administered 2019-01-27 – 2019-01-28 (×4): 0.2 mg via ORAL
  Filled 2019-01-27 (×4): qty 1

## 2019-01-27 MED ORDER — METOPROLOL TARTRATE 25 MG/10 ML ORAL SUSPENSION
100.0000 mg | Freq: Two times a day (BID) | ORAL | Status: DC
  Administered 2019-01-27 – 2019-03-03 (×70): 100 mg
  Filled 2019-01-27 (×71): qty 40

## 2019-01-27 MED ORDER — CLONIDINE HCL 0.2 MG PO TABS
0.1000 mg | ORAL_TABLET | Freq: Two times a day (BID) | ORAL | Status: AC
  Administered 2019-01-29 (×2): 0.1 mg via ORAL
  Filled 2019-01-27 (×2): qty 1

## 2019-01-27 MED ORDER — CLONIDINE HCL 0.2 MG PO TABS
0.1000 mg | ORAL_TABLET | Freq: Four times a day (QID) | ORAL | Status: AC
  Administered 2019-01-28 – 2019-01-29 (×4): 0.1 mg via ORAL
  Filled 2019-01-27 (×4): qty 1

## 2019-01-27 NOTE — Progress Notes (Signed)
ANTICOAGULATION CONSULT NOTE - Follow-Up  Pharmacy Consult for Heparin Indication: atrial fibrillation and stroke  Allergies  Allergen Reactions  . Oxycodone Itching    Patient Measurements: Weight: 180 lb 8.9 oz (81.9 kg)  Vital Signs: Temp: 98.4 F (36.9 C) (12/19 1516) Temp Source: Axillary (12/19 1516) BP: 99/62 (12/19 1900) Pulse Rate: 87 (12/19 1900)  Labs: Recent Labs    01/25/19 0519 01/26/19 0522 01/27/19 0236 01/27/19 1011 01/27/19 1726  HGB 11.7* 11.4* 11.9*  --   --   HCT 35.5* 34.9* 36.9*  --   --   PLT 241 300 357  --   --   HEPARINUNFRC  --   --  0.39 0.55 0.23*  CREATININE 0.76 0.86  --  0.95  --     CrCl cannot be calculated (Unknown ideal weight.).  Assessment: 77 YOM off warfarin PTA for history of Afib who presented on 12/10 with an acute stroke. The patient required intubation so pharmacy consulted to dose Heparin for anticoagulation.    Heparin level high this am, now below goal at 0.23.  Goal of Therapy:  Heparin level 0.3 - 0.5 units/ml; no boluses Monitor platelets by anticoagulation protocol: Yes   Plan:  -Increase heparin back to 1550 units/h -Recheck heparin level with morning labs   [name], PharmD, BCPS Clinical Pharmacist [phone] Please check AMION for all [place] numbers 01/27/2019

## 2019-01-27 NOTE — Progress Notes (Signed)
STROKE TEAM PROGRESS NOTE   INTERVAL HISTORY Patient yet remains sedated and intubated for agitation and respiratory distress.  He remains aphasic and not following any commands.  Currently on Abx and heparin IV. Not able to extubate today. CCM following.    OBJECTIVE Vitals:   01/27/19 0600 01/27/19 0700 01/27/19 0718 01/27/19 0805  BP: (!) 87/62 (!) 88/58  97/68  Pulse: 87 80  83  Resp: 16 14  15   Temp:   99.6 F (37.6 C)   TempSrc:   Axillary   SpO2: 97% 99%  99%  Weight:        CBC:  Recent Labs  Lab 01/26/19 0522 01/27/19 0236  WBC 7.8 9.0  HGB 11.4* 11.9*  HCT 34.9* 36.9*  MCV 107.7* 106.6*  PLT 300 [id number]    Basic Metabolic Panel:  Recent Labs  Lab 01/20/19 1700 01/24/19 2045 01/25/19 0519 01/26/19 0522  NA  --  144 142 145  K  --  3.5 3.4* 3.7  CL  --  111 108 110  CO2  --  24 25 25   GLUCOSE  --  132* 111* 100*  BUN  --  30* 23 22  CREATININE  --  0.77 0.76 0.86  CALCIUM  --  8.6* 8.2* 8.3*  MG 1.7 1.9  --   --   PHOS 2.5  --   --   --     Lipid Panel:     Component Value Date/Time   CHOL 238 (H) 01/19/2019 0437   TRIG 64 01/19/2019 0437   HDL 43 01/19/2019 0437   CHOLHDL 5.5 01/19/2019 0437   VLDL 13 01/19/2019 0437   LDLCALC 182 (H) 01/19/2019 0437   HgbA1c:  Lab Results  Component Value Date   HGBA1C 5.1 01/19/2019   Urine Drug Screen:     Component Value Date/Time   LABOPIA NONE DETECTED 07/13/2016 1428   COCAINSCRNUR NONE DETECTED 07/13/2016 1428   LABBENZ POSITIVE (A) 07/13/2016 1428   AMPHETMU NONE DETECTED 07/13/2016 1428   THCU POSITIVE (A) 07/13/2016 1428   LABBARB NONE DETECTED 07/13/2016 1428    Alcohol Level     Component Value Date/Time   ETH <5 02/09/2015 0010    IMAGING  CT HEAD WO CONTRAST 01/24/2019 IMPRESSION: Involving acute/subacute infarcts in the left frontal parietal lobe and left occipital lobe. Small amount of hemorrhage in the left posterior frontal infarct unchanged from prior CT and MRI.  Electronically Signed   By: Franchot Gallo M.D.   On: 01/24/2019 16:18   MRI Wo Contrast  01/20/2019 IMPRESSION: 1. Multifocal acute ischemia within the left MCA territory, including the left postcentral gyrus. No hemorrhage or mass effect. 2. 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 3. Chronic ischemic microangiopathy.  CT Head Wo Contrast  01/19/2019 IMPRESSION: Interval demarcation of an acute cortically based infarct within the lateral left frontal lobe as described. No acute intracranial hemorrhage or significant mass effect.  CT Code Stroke CTA Head W/WO contrast CT Code Stroke CTA Neck W/WO contrast 01/18/2019 IMPRESSION:   Atherosclerotic disease at both carotid bifurcations but no stenosis.   Prominent soft plaque at the left ICA bulb.   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   Dominant right vertebral artery widely patent.   No intracranial large or medium vessel occlusion or correctable proximal stenosis, with specific attention to the left MCA branches.   I do think there are a few distal MCA  branches that lose opacification and there could be a few small emboli in the left MCA territory. Vessels marked with arrows.   Distal vessel atherosclerotic irregularity, particularly evident in the PCA branches.   Aortic Atherosclerosis (ICD10-I70.0)   Emphysema (ICD10-J43.9).   CT HEAD CODE STROKE WO CONTRAST 01/18/2019  IMPRESSION: 1. No acute finding by CT. Chronic small-vessel ischemic changes of the white matter. Old right cerebellar and right occipital infarctions.  2. ASPECTS is 10   Transthoracic Echocardiogram  01/19/2019 IMPRESSIONS  1. Left ventricular ejection fraction, by visual estimation, is 55 to 60%. The left ventricle has normal function. There is moderately increased left ventricular hypertrophy.  2. The left ventricle demonstrates regional wall motion abnormalities.  3.  Global right ventricle has normal systolic function.The right ventricular size is normal. No increase in right ventricular wall thickness.  4. Left atrial size was normal.  5. Right atrial size was mildly dilated.  6. The mitral valve is normal in structure. Mild to moderate mitral valve regurgitation.  7. The tricuspid valve is normal in structure. Tricuspid valve regurgitation is trivial.  8. The aortic valve is normal in structure. Aortic valve regurgitation is not visualized.  9. The pulmonic valve was normal in structure. Pulmonic valve regurgitation is not visualized. 10. The atrial septum is grossly normal.  ECG atrial fibrillation - ventricular response 133 BPM (See cardiology reading for complete details)  PHYSICAL EXAM  Blood pressure 97/68, pulse 83, temperature 99.6 F (37.6 C), temperature source Axillary, resp. rate 15, weight 81.9 kg, SpO2 99 %.  General -frail elderly Caucasian male, intubated on sedation  Ophthalmologic - fundi not visualized due to noncooperation.  Cardiovascular - irregularly irregular heart rate and rhythm.  Neuro - intubated  on sedation, eyes closed, not following commands.  Globally aphasic.  Barely open eyes with pain stimulation. With forced eye opening, pt resisted eye opening bilaterally, eyes in mid position, not blinking to visual threat bilaterally, not tracking, pupil bilaterally 2 mm, sluggish to light. Doll's eyes sluggish, corneal reflex weakly present L>R, gag and cough present.  Breathing over the vent.  Facial symmetry not able to test due to ET tube. On pain stimulation, no movement of right extremity but has some tone and movement in the left upper extremity, slight toe and leg movement at BLEs. Sensation, coordination and gait not tested.   ASSESSMENT/PLAN [name] is a 68 y.o. male with history of tobacco use, previous strokes, COPD, AAA, ASPVD, htn, hld, pulmonary nodule, depression, atrial fibrillation on coumadin  but recently missed doses presenting with transient aphasia and leaning to the right. The patient received IV t-PA Thursday 01/18/19 @ 2000.  Stroke - left MCA infarct s/p t-PA - embolic pattern, likely due to atrial fibrillation with subtherapeutic INR  Resultant global aphasia, left gaze, right hemiparesis, right neglect and agitation  Code Stroke CT Head - No acute finding by CT. Chronic small-vessel ischemic changes of the white matter. Old right cerebellar and right occipital infarctions. ASPECTS is 10   CT head - Interval demarcation of an acute cortically based infarct within the lateral left frontal lobe as described. No acute intracranial hemorrhage or significant mass effect.  MRI head -  Multifocal acute ischemia within the left MCA territory, including the left postcentral gyrus. No hemorrhage or mass effect. 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 Chronic ischemic microangiopathy.  CTA H&N - Prominent soft plaque at the left ICA bulb. Left vertebral artery occluded at its origin and reconstituted in  the distal cervical region by collaterals.   CT 12/16 stable, continued evolution of left MCA infarcts  2D Echo - EF 55 to 60%. No cardiac source of emboli identified.   [employer] Virus 2 - negative  LDL - 182  HgbA1c - 5.1  VTE prophylaxis -Heparin subq  warfarin daily prior to admission, now on heparin IV  Therapy recommendations:  Signed off, reorder once off vent  Disposition:  Pending  Afib with RVR  On amiodarone and Cardizem drip  Still has RVR due to agitation  On lopressor 25 bid (on 100 bid at home) increase as needed    On warfarin PTA, INR 1.1 on admission  On aspirin for now  On heparin IV  AMS with agitation, chronic benzo use Respiratory failure with hypoxia  Intubated   Weaned off precedex and fentanyl and versed  On resperidone 1mg  bid, klonopin 0.5 bid, oxycodone 5 mb q6h  CXR Increased right pleural effusion with associated  right basilar atelectasis or infiltrate  Tachypnea on SBT received bolus sedation  CCM on board  Extubate as able  B12 deficiency  Vitamin B12 = 161  B12 supplement  History of stroke  11/2014, bilateral blurry vision after left upper extremity bypass surgery.  Found to have left central vision loss.  MRI showed bilateral embolic infarcts.  INR 1.45.  Carotid Doppler negative.  TTE unremarkable.  LDL 82, A1c 5.4.  Continued on discharge with Coumadin and Lipitor 40  hypotension Hx of hypertension  Home BP meds: Diltiazem, Lasix and metoprolol  Current BP meds clonidine 0.3 q 6h , metoprolol 25 bid  BP stable  Off neo . Avoid low BP  Hyperlipidemia  Home Lipid lowering medication: Lipitor 40 mg daily  LDL 182, goal < 70  Current lipid lowering medication: Lipitor 80 mg daily   Continue statin at discharge  Dysphagia  N.p.o.  Speech to follow once extubated  P.o. meds through tube  On bolus feed  VAP Fever and leukocytosis  T-max 101.7->100.2->100.4->101.5->99.6  leucocytosis - 11.1->11.7->11.0->14.0->13.4->12.6->10.0->13.4->7.8->9.0  CXR with increased R pleural effusion w/ R basilar atx/infiltrate  UA neg 01/18/19  On cefepime (started 01/26/19 x 7 days) for S.pneumoniae and E.cloacae in sputum  CCM on board  Tobacco abuse  Current smoker  Smoking cessation counseling will be provided  Pt is willing to quit  Other Stroke Risk Factors  Advanced age  ETOH use, advised to drink no more than 1 alcoholic beverage per day.  Other Active Problems  Hypokalemia 3.3->3.4->3.3->3.1->4.0->4.3->4.4->4.4->4.0->3.4->3.7  Oral thrush on nystatin  Mild Anemia - Hb - 11.9   Hospital day # 9 Continue IV heparin till patient has a PEG tube or is able to swallow and can be switched to warfarin.  Patient prognosis remains poor given his significant aphasia and hemiparesis is unlikely to survive without prolong feeding tube and nursing care.  We will  need to discuss goals of care with family and even if is able to wean off ventilatory support question about DNR and one-way extubation needs to be discussed.  Discussed with Dr. Shearon Stalls critical care medicine This patient is critically ill and at significant risk of neurological worsening, death and care requires constant monitoring of vital signs, hemodynamics,respiratory and cardiac monitoring, extensive review of multiple databases, frequent neurological assessment, discussion with family, other specialists and medical decision making of high complexity. I spent 30 minutes of neurocritical care time  in the care of  this patient.  Antony Contras, MD To contact Stroke Continuity  provider, please refer to [email or web address] After hours, contact General Neurology

## 2019-01-27 NOTE — Progress Notes (Signed)
[place] for Heparin Indication: atrial fibrillation and stroke  Allergies  Allergen Reactions  . Oxycodone Itching    Patient Measurements: Weight: 180 lb 8.9 oz (81.9 kg)  Vital Signs: Temp: 102.4 F (39.1 C) (12/18 2309) Temp Source: Axillary (12/18 2309) BP: 105/63 (12/19 0100) Pulse Rate: 91 (12/19 0100)  Labs: Recent Labs    01/24/19 0400 01/24/19 0400 01/24/19 2045 01/25/19 0519 01/26/19 0522 01/26/19 0730 01/26/19 1832 01/27/19 0236  HGB 12.4*  --   --  11.7* 11.4*  --   --   --   HCT 38.2*  --   --  35.5* 34.9*  --   --   --   PLT 207  --   --  241 300  --   --   --   HEPARINUNFRC  --    < >  --   --   --  <0.10* <0.10* 0.39  CREATININE 0.84  --  0.77 0.76 0.86  --   --   --    < > = values in this interval not displayed.    CrCl cannot be calculated (Unknown ideal weight.).  Assessment: 68 yo m presenting with acute stroke - had been off his warfarin for afib for a while.   12/19 AM update: heparin level therapeutic x 1 after rate increase  Goal of Therapy:  Heparin level 0.3 - 0.5 units/ml; no boluses Monitor platelets by anticoagulation protocol: Yes   Plan:  Cont heparin at 1550 units/hr Confirmatory heparin level at 1000 F/u plans to start [place], PharmD, [place] Pharmacist Phone: [phone]

## 2019-01-27 NOTE — Progress Notes (Addendum)
NAME:  [name], MRN:  [id number], DOB:  [birth year], LOS: 9 ADMISSION DATE:  01/18/2019, CONSULTATION DATE:  01/18/19 REFERRING MD:  Leonel Ramsay, CHIEF COMPLAINT:  Unable to elicit   Brief History   21yM with hypertensive emergency in setting of acute L MCA stroke s/p tPA, AF/RVR  Has been noncompliant with his warfarin for A. Fib. Code stroke activated, TPA administered. Intubated for respiratory failure. PCCM consulted for vent management  Past Medical History  AF COPD CVA PAD  Significant Hospital Events   12/10 tPA administered at 1946 12/11 intubated  Consults:  PCCM  Procedures:  None  Significant Diagnostic Tests:  CTA h/n 12/10 > distal MCA branches that lose opacification and there could be a few small emboli in the left MCA territory.  CT head 12/11 > Interval demarcation of an acute cortically based infarct within the lateral left frontal lobe as described. No acute intracranial hemorrhage or significant mass effect.  MRI brain 12/12 >  1. Multifocal acute ischemia within the left MCA territory, including the left postcentral gyrus. No hemorrhage or mass effect. 2. 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 3. Chronic ischemic microangiopathy.  Micro Data:  Blood 12/10 > NGTD Urine 12/10 > insignificant growth  MRSA surveillance 12/11 > negative  SARS Coronavirus 2 12/11 > negative  Antimicrobials:  None  Interim history/subjective:  No acute events overnight. On PS trial of 10/5 today but not very awake. Bps are soft.   Objective   Blood pressure 97/68, pulse 83, temperature 99.6 F (37.6 C), temperature source Axillary, resp. rate 15, weight 81.9 kg, SpO2 99 %.    Vent Mode: PRVC;PSV FiO2 (%):  [30 %] 30 % Set Rate:  [12 bmp] 12 bmp Vt Set:  [540 mL] 540 mL PEEP:  [5 cmH20] 5 cmH20 Pressure Support:  [10 cmH20] 10 cmH20 Plateau Pressure:  [13 cmH20-18 cmH20] 13 cmH20   Intake/Output Summary (Last 24 hours) at 01/27/2019 [id number] Last  data filed at 01/27/2019 0600 Gross per 24 hour  Intake 1115.87 ml  Output 1170 ml  Net -54.13 ml   Filed Weights   01/25/19 0500 01/26/19 0247 01/27/19 0219  Weight: 83.8 kg 85.3 kg 81.9 kg    Examination: General: Chronically ill appearing elderly male on mechanical ventilation, in NAD HEENT: ETT, MM pink/moist, PERRL, white coating over tongue  Neuro: Sleepy on ventilator but will arouse, not following commands for me today CV: s1s2 regular rate and rhythm, no murmur, rubs, or gallops,  PULM:  Clear to ascultation, slightly diminished to right base  GI: soft, bowel sounds active in all 4 quadrants, non-tender, non-distended, tolerating TF Extremities: warm/dry, no edema  Skin: no rashes or lesions  Resolved Hospital Problem list   Hypertensive emergency   Assessment & Plan:  Acute stroke with distal branch occlusions of left MCA -Status post tPA Plan: Management per Neuro   Acute respiratory failure due to CVA Plan: Mentation continue to improve and currently tolerating wean Likely can extubate today  Head of bed 30 degrees Follow intermittent CXR and ABG VAP bundle in place PAD protocol   VAP -Developed low grade fever overnight 12/15 with worsening right sided opacities on CXR sputum culture 12/16 with abundant gram positive cocci.  Plan:  Continue to follow formal culture results  Continue IV antibiotics, will finish 7 days of cefepime for suspected gram negative bacteria pna Frequent oral care  SLP once able to tolerate oral diet   Agitation, improved  Plan: Continue currently medication  regiment  Minimize IV sedation as able  Low dose Valproric acid started 12/17 for further assist in management of agitation   Atrial fibrillation with RVR -now rate contolled Plan: Continue PO Cardizem, lopressor,  and Amiodarone - increase metoprolol today.  IV heparin resumes 12/17 Continuous telemetry   Chronic benzodiazepine use Plan: Continue Risperidone,  klonopin Will start stepping down klonopin over the next couple days to help with mental status.  He is on clonidine taper after coming off precedex starting today.   Oral thrush Plan: Nystatin suspension  Continue frequent oral care   Best practice:  Diet: N.p.o. TF per recs  Pain/Anxiety/Delirium protocol (if indicated): Precedex, fentanyl,klonopin stop versed for mental status VAP protocol (if indicated): In place DVT prophylaxis: SCDs GI prophylaxis: PPI  Glucose control: has not required coverage. D/c accu checks. Hypoglycemia protocol  Mobility: Bedrest Code Status: Full code Family Communication: I updated the family - brother. Discussed his weakness and aphasia and COPD will make vent weaning difficult. I discussed tracheostomy and PEG tube and he thinks his brother would want these things. He says he has not been coming to hospital due to covid. I recommend he see his brother now the way he is. I will ask neurology to reach out to family as well to discuss prognostication from stroke.  Disposition:  ICU  Labs and imaging for past 48 hours reviewed in EMR    Critical care:   The patient is critically ill with multiple organ systems failure and requires high complexity decision making for assessment and support, frequent evaluation and titration of therapies, application of advanced monitoring technologies and extensive interpretation of multiple databases.   Critical Care Time devoted to patient care services described in this note is 45 minutes. This time reflects time of care of this [place] . This critical care time does not reflect separately billable procedures or procedure time, teaching time or supervisory time of PA/NP/Med student/Med Resident etc but could involve care discussion time.  [facility] 01/27/2019 8:29 AM  Pager: [phone] After hours pager: [phone]

## 2019-01-27 NOTE — Progress Notes (Signed)
[facility] Progress Note Patient Name: [name] DOB: 12-Dec-1950 MRN: [id number]   Date of Service  01/27/2019  HPI/Events of Note  Nursing reports that the L hand is cold to touch, no palpable pulses, Capillary refill > 3 seconds and on Heparin IV infusion.  Not sure that there is anything to add from the perspective of medical Rx.  eICU Interventions  Plan: 1. Will ask ground team to evaluate at bedside and see if Vascular Surgery consult is needed.      Intervention Category Major Interventions: Other:  Lysle Dingwall 01/27/2019, 8:51 PM

## 2019-01-27 NOTE — Progress Notes (Signed)
ANTICOAGULATION CONSULT NOTE - Follow-Up  Pharmacy Consult for Heparin Indication: atrial fibrillation and stroke  Allergies  Allergen Reactions  . Oxycodone Itching    Patient Measurements: Weight: 180 lb 8.9 oz (81.9 kg)  Vital Signs: Temp: 99.6 F (37.6 C) (12/19 0718) Temp Source: Axillary (12/19 0718) BP: 97/68 (12/19 0805) Pulse Rate: 83 (12/19 0805)  Labs: Recent Labs    01/24/19 2045 01/25/19 0519 01/26/19 0522 01/26/19 0730 01/26/19 1832 01/27/19 0236  HGB  --  11.7* 11.4*  --   --  11.9*  HCT  --  35.5* 34.9*  --   --  36.9*  PLT  --  241 300  --   --  357  HEPARINUNFRC  --   --   --  <0.10* <0.10* 0.39  CREATININE 0.77 0.76 0.86  --   --   --     CrCl cannot be calculated (Unknown ideal weight.).  Assessment: 46 YOM off warfarin PTA for history of Afib who presented on 12/10 with an acute stroke. The patient required intubation so pharmacy consulted to dose Heparin for anticoagulation.    Heparin level this morning is slightly SUPRAtherapeutic of lower goal range due to CVA (HL 0.55  << 0.39, goal of 0.3-0.5). CBC stable - no bleeding noted.   Goal of Therapy:  Heparin level 0.3 - 0.5 units/ml; no boluses Monitor platelets by anticoagulation protocol: Yes   Plan:  - Reduce Heparin to 1500 units/hr (15 ml/hr) - Will continue to monitor for any signs/symptoms of bleeding and will follow up with heparin level in 6 hours   Thank you for allowing pharmacy to be a part of this patient's care.  Alycia Rossetti, PharmD, BCPS Clinical Pharmacist Clinical phone for 01/27/2019: [phone] 01/27/2019 8:11 AM   **Pharmacist phone directory can now be found on [email or web address] ([email or web address]).  Listed under [place].

## 2019-01-27 NOTE — Progress Notes (Signed)
PCCM progress:  Paged to see patient regarding cool L hand, pt has a history of A fib, but is anti-coagulated.  On evaluation, the hand is not cold, but is cooler than the R side.  Radial pulse present, but thready. Will obtain arterial doppler.

## 2019-01-28 ENCOUNTER — Inpatient Hospital Stay (HOSPITAL_COMMUNITY): Payer: Medicare Other

## 2019-01-28 DIAGNOSIS — J9621 Acute and chronic respiratory failure with hypoxia: Secondary | ICD-10-CM

## 2019-01-28 DIAGNOSIS — J9620 Acute and chronic respiratory failure, unspecified whether with hypoxia or hypercapnia: Secondary | ICD-10-CM | POA: Diagnosis not present

## 2019-01-28 DIAGNOSIS — I480 Paroxysmal atrial fibrillation: Secondary | ICD-10-CM

## 2019-01-28 DIAGNOSIS — M79642 Pain in left hand: Secondary | ICD-10-CM

## 2019-01-28 LAB — BASIC METABOLIC PANEL
Anion gap: 9 (ref 5–15)
BUN: 22 mg/dL (ref 8–23)
CO2: 26 mmol/L (ref 22–32)
Calcium: 8.1 mg/dL — ABNORMAL LOW (ref 8.9–10.3)
Chloride: 110 mmol/L (ref 98–111)
Creatinine, Ser: 0.73 mg/dL (ref 0.61–1.24)
GFR calc Af Amer: >60 mL/min (ref >60)
GFR calc non Af Amer: >60 mL/min (ref >60)
Glucose, Bld: 100 mg/dL — ABNORMAL HIGH (ref 70–99)
Potassium: 3.5 mmol/L (ref 3.5–5.1)
Sodium: 145 mmol/L (ref 135–145)

## 2019-01-28 LAB — GLUCOSE, CAPILLARY
Glucose-Capillary: 115 mg/dL — ABNORMAL HIGH (ref 70–99)
Glucose-Capillary: 128 mg/dL — ABNORMAL HIGH (ref 70–99)
Glucose-Capillary: 135 mg/dL — ABNORMAL HIGH (ref 70–99)
Glucose-Capillary: 144 mg/dL — ABNORMAL HIGH (ref 70–99)
Glucose-Capillary: 86 mg/dL (ref 70–99)
Glucose-Capillary: 92 mg/dL (ref 70–99)
Glucose-Capillary: 99 mg/dL (ref 70–99)

## 2019-01-28 LAB — CBC
HCT: 36.4 % — ABNORMAL LOW (ref 39.0–52.0)
Hemoglobin: 11.6 g/dL — ABNORMAL LOW (ref 13.0–17.0)
MCH: 34.6 pg — ABNORMAL HIGH (ref 26.0–34.0)
MCHC: 31.9 g/dL (ref 30.0–36.0)
MCV: 108.7 fL — ABNORMAL HIGH (ref 80.0–100.0)
Platelets: 436 10*3/uL — ABNORMAL HIGH (ref 150–400)
RBC: 3.35 MIL/uL — ABNORMAL LOW (ref 4.22–5.81)
RDW: 15.4 % (ref 11.5–15.5)
WBC: 11.5 10*3/uL — ABNORMAL HIGH (ref 4.0–10.5)
nRBC: 0 % (ref 0.0–0.2)

## 2019-01-28 LAB — HEPARIN LEVEL (UNFRACTIONATED)
Heparin Unfractionated: 0.34 IU/mL (ref 0.30–0.70)
Heparin Unfractionated: 0.2 IU/mL — ABNORMAL LOW (ref 0.30–0.70)
Heparin Unfractionated: 0.34 IU/mL (ref 0.30–0.70)

## 2019-01-28 MED ORDER — MIDAZOLAM HCL 2 MG/2ML IJ SOLN
5.0000 mg | Freq: Once | INTRAMUSCULAR | Status: AC
  Administered 2019-01-29: 2 mg via INTRAVENOUS
  Filled 2019-01-28: qty 6

## 2019-01-28 MED ORDER — FENTANYL CITRATE (PF) 100 MCG/2ML IJ SOLN
200.0000 ug | Freq: Once | INTRAMUSCULAR | Status: AC
  Administered 2019-01-29: 13:00:00 100 ug via INTRAVENOUS
  Filled 2019-01-28: qty 4

## 2019-01-28 MED ORDER — CLONAZEPAM 0.5 MG PO TBDP
0.5000 mg | ORAL_TABLET | Freq: Every day | ORAL | Status: AC
  Administered 2019-01-29: 0.5 mg
  Filled 2019-01-28: qty 1

## 2019-01-28 MED ORDER — VECURONIUM BROMIDE 10 MG IV SOLR
10.0000 mg | Freq: Once | INTRAVENOUS | Status: DC
  Filled 2019-01-28 (×2): qty 10

## 2019-01-28 MED ORDER — ETOMIDATE 2 MG/ML IV SOLN
40.0000 mg | Freq: Once | INTRAVENOUS | Status: AC
  Administered 2019-01-29: 13:00:00 20 mg via INTRAVENOUS
  Filled 2019-01-28: qty 20

## 2019-01-28 NOTE — Progress Notes (Signed)
STROKE TEAM PROGRESS NOTE   INTERVAL HISTORY Patient has been unable to be weaned yet remains sedated and intubated for agitation and respiratory distress.  He remains aphasic and not following any commands consistently.  Currently on Abx and heparin IV.  CCM MD feels patient will likely need tracheostomy.  I spoke to the patient's brother over the phone and answered his questions and he wants full support including trach and PEG and nursing home.  OBJECTIVE Vitals:   01/28/19 0300 01/28/19 0400 01/28/19 0500 01/28/19 0600  BP: 101/65 110/74 111/78 (!) 128/98  Pulse: 77 82 94 93  Resp: 19 16 16  (!) 21  Temp:  (!) 100.7 F (38.2 C)    TempSrc:  Oral    SpO2: 100% 99% 96% 98%  Weight:    84.2 kg    CBC:  Recent Labs  Lab 01/27/19 0236 01/28/19 0430  WBC 9.0 11.5*  HGB 11.9* 11.6*  HCT 36.9* 36.4*  MCV 106.6* 108.7*  PLT 357 436*    Basic Metabolic Panel:  Recent Labs  Lab 01/24/19 2045 01/27/19 1011 01/28/19 0430  NA 144 144 145  K 3.5 4.4 3.5  CL 111 111 110  CO2 24 24 26   GLUCOSE 132* 140* 100*  BUN 30* 21 22  CREATININE 0.77 0.95 0.73  CALCIUM 8.6* 8.7* 8.1*  MG 1.9  --   --     Lipid Panel:     Component Value Date/Time   CHOL 238 (H) 01/19/2019 0437   TRIG 64 01/19/2019 0437   HDL 43 01/19/2019 0437   CHOLHDL 5.5 01/19/2019 0437   VLDL 13 01/19/2019 0437   LDLCALC 182 (H) 01/19/2019 0437   HgbA1c:  Lab Results  Component Value Date   HGBA1C 5.1 01/19/2019   Urine Drug Screen:     Component Value Date/Time   LABOPIA NONE DETECTED 07/13/2016 1428   COCAINSCRNUR NONE DETECTED 07/13/2016 1428   LABBENZ POSITIVE (A) 07/13/2016 1428   AMPHETMU NONE DETECTED 07/13/2016 1428   THCU POSITIVE (A) 07/13/2016 1428   LABBARB NONE DETECTED 07/13/2016 1428    Alcohol Level     Component Value Date/Time   ETH <5 02/09/2015 0010    IMAGING  CT HEAD WO CONTRAST 01/24/2019 IMPRESSION: Involving acute/subacute infarcts in the left frontal parietal lobe  and left occipital lobe. Small amount of hemorrhage in the left posterior frontal infarct unchanged from prior CT and MRI. Electronically Signed   By: Franchot Gallo M.D.   On: 01/24/2019 16:18   MRI Wo Contrast  01/20/2019 IMPRESSION: 1. Multifocal acute ischemia within the left MCA territory, including the left postcentral gyrus. No hemorrhage or mass effect. 2. 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 3. Chronic ischemic microangiopathy.  CT Head Wo Contrast  01/19/2019 IMPRESSION: Interval demarcation of an acute cortically based infarct within the lateral left frontal lobe as described. No acute intracranial hemorrhage or significant mass effect.  CT Code Stroke CTA Head W/WO contrast CT Code Stroke CTA Neck W/WO contrast 01/18/2019 IMPRESSION:   Atherosclerotic disease at both carotid bifurcations but no stenosis.   Prominent soft plaque at the left ICA bulb.   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   Dominant right vertebral artery widely patent.   No intracranial large or medium vessel occlusion or correctable proximal stenosis, with specific attention to the left MCA branches.   I do think there are a few distal MCA branches that lose opacification  and there could be a few small emboli in the left MCA territory. Vessels marked with arrows.   Distal vessel atherosclerotic irregularity, particularly evident in the PCA branches.   Aortic Atherosclerosis (ICD10-I70.0)   Emphysema (ICD10-J43.9).   CT HEAD CODE STROKE WO CONTRAST 01/18/2019  IMPRESSION: 1. No acute finding by CT. Chronic small-vessel ischemic changes of the white matter. Old right cerebellar and right occipital infarctions.  2. ASPECTS is 10   Transthoracic Echocardiogram  01/19/2019 IMPRESSIONS  1. Left ventricular ejection fraction, by visual estimation, is 55 to 60%. The left ventricle has normal function. There is moderately  increased left ventricular hypertrophy.  2. The left ventricle demonstrates regional wall motion abnormalities.  3. Global right ventricle has normal systolic function.The right ventricular size is normal. No increase in right ventricular wall thickness.  4. Left atrial size was normal.  5. Right atrial size was mildly dilated.  6. The mitral valve is normal in structure. Mild to moderate mitral valve regurgitation.  7. The tricuspid valve is normal in structure. Tricuspid valve regurgitation is trivial.  8. The aortic valve is normal in structure. Aortic valve regurgitation is not visualized.  9. The pulmonic valve was normal in structure. Pulmonic valve regurgitation is not visualized. 10. The atrial septum is grossly normal.  ECG atrial fibrillation - ventricular response 133 BPM (See cardiology reading for complete details)  PHYSICAL EXAM  Blood pressure (!) 128/98, pulse 93, temperature (!) 100.7 F (38.2 C), temperature source Oral, resp. rate (!) 21, weight 84.2 kg, SpO2 98 %.  General -frail elderly Caucasian male, intubated on sedation  Ophthalmologic - fundi not visualized due to noncooperation.  Cardiovascular - irregularly irregular heart rate and rhythm.  Neuro - intubated  on sedation, eyes closed, not following commands.  Globally aphasic.  Barely open eyes with pain stimulation. With forced eye opening, pt resisted eye opening bilaterally, eyes in mid position, not blinking to visual threat bilaterally, not tracking, pupil bilaterally 2 mm, sluggish to light. Doll's eyes sluggish, corneal reflex weakly present L>R, gag and cough present.  Breathing over the vent.  Facial symmetry not able to test due to ET tube. On pain stimulation, no movement of right extremity but has some tone and movement in the left upper extremity, slight toe and leg movement at BLEs. Sensation, coordination and gait not tested.   ASSESSMENT/PLAN [name] is a 68 y.o. male with  history of tobacco use, previous strokes, COPD, AAA, ASPVD, htn, hld, pulmonary nodule, depression, atrial fibrillation on coumadin but recently missed doses presenting with transient aphasia and leaning to the right. The patient received IV t-PA Thursday 01/18/19 @ 2000.  Stroke - left MCA infarct s/p t-PA - embolic pattern, likely due to atrial fibrillation with subtherapeutic INR  Resultant global aphasia, left gaze, right hemiparesis, right neglect and agitation  Code Stroke CT Head - No acute finding by CT. Chronic small-vessel ischemic changes of the white matter. Old right cerebellar and right occipital infarctions. ASPECTS is 10   CT head - Interval demarcation of an acute cortically based infarct within the lateral left frontal lobe as described. No acute intracranial hemorrhage or significant mass effect.  MRI head -  Multifocal acute ischemia within the left MCA territory, including the left postcentral gyrus. No hemorrhage or mass effect. 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 Chronic ischemic microangiopathy.  CTA H&N - Prominent soft plaque at the left ICA bulb. 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   CT 12/16 stable, continued evolution of left MCA infarcts  2D Echo - EF 55 to 60%. No cardiac source of emboli identified.   [employer] Virus 2 - negative  LDL - 182  HgbA1c - 5.1  VTE prophylaxis -Heparin subq  warfarin daily prior to admission, now on heparin IV  Therapy recommendations:  Signed off, reorder once off vent  Disposition:  Pending  Afib with RVR  On amiodarone and Cardizem drip  Still has RVR due to agitation  On lopressor 25 bid (on 100 bid at home) increase as needed    On warfarin PTA, INR 1.1 on admission  On aspirin for now  On heparin IV  AMS with agitation, chronic benzo use Respiratory failure with hypoxia  Intubated   Weaned off precedex and fentanyl and  versed  On resperidone 1mg  bid, klonopin 0.5 bid, oxycodone 5 mb q6h  CXR Increased right pleural effusion with associated right basilar atelectasis or infiltrate  Tachypnea on SBT received bolus sedation  CCM on board  Extubate as able  B12 deficiency  Vitamin B12 = 161  B12 supplement  History of stroke  11/2014, bilateral blurry vision after left upper extremity bypass surgery.  Found to have left central vision loss.  MRI showed bilateral embolic infarcts.  INR 1.45.  Carotid Doppler negative.  TTE unremarkable.  LDL 82, A1c 5.4.  Continued on discharge with Coumadin and Lipitor 40  hypotension Hx of hypertension  Home BP meds: Diltiazem, Lasix and metoprolol  Current BP meds clonidine 0.3 q 6h , metoprolol 25 bid  BP stable  Off neo . Avoid low BP  Hyperlipidemia  Home Lipid lowering medication: Lipitor 40 mg daily  LDL 182, goal < 70  Current lipid lowering medication: Lipitor 80 mg daily   Continue statin at discharge  Dysphagia  N.p.o.  Speech to follow once extubated  P.o. meds through tube  On bolus feed  VAP Fever and leukocytosis  T-max 101.7->100.2->100.4->101.5->99.6  leucocytosis - 11.1->11.7->11.0->14.0->13.4->12.6->10.0->13.4->7.8->9.0  CXR with increased R pleural effusion w/ R basilar atx/infiltrate  UA neg 01/18/19  On cefepime (started 01/26/19 x 7 days) for S.pneumoniae and E.cloacae in sputum  CCM on board  Tobacco abuse  Current smoker  Smoking cessation counseling will be provided  Pt is willing to quit  Other Stroke Risk Factors  Advanced age  ETOH use, advised to drink no more than 1 alcoholic beverage per day.  Other Active Problems  Hypokalemia 3.3->3.4->3.3->3.1->4.0->4.3->4.4->4.4->4.0->3.4->3.7  Oral thrush on nystatin  Mild Anemia - Hb - 11.9   Hospital day # 10 Continue IV heparin till patient has a trach and PEG tube   and can be switched to warfarin.  Patient prognosis remains poor given  his significant aphasia and hemiparesis is unlikely to survive without prolong feeding tube and nursing care.  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  Discussed with Dr. Shearon Stalls critical care medicine This patient is critically ill and at significant risk of neurological worsening, death and care requires constant monitoring of vital signs, hemodynamics,respiratory and cardiac monitoring, extensive review of multiple databases, frequent neurological assessment, discussion with family, other specialists and medical decision making of high complexity. I spent 32 minutes of neurocritical care time  in  the care of  this patient.  Antony Contras, MD To contact Stroke Continuity provider, please refer to [email or web address] After hours, contact General Neurology

## 2019-01-28 NOTE — Progress Notes (Signed)
VASCULAR LAB PRELIMINARY  PRELIMINARY  PRELIMINARY  PRELIMINARY  Left upper extremity arterial duplex completed.    Preliminary report:  See CV proc for preliminary results.   Shawnay Bramel, RVT 01/28/2019, 6:01 PM

## 2019-01-28 NOTE — Progress Notes (Signed)
NAME:  [name], MRN:  [id number], DOB:  06-20-1950, LOS: 68 ADMISSION DATE:  01/18/2019, CONSULTATION DATE:  01/18/19 REFERRING MD:  Leonel Ramsay, CHIEF COMPLAINT:  Unable to elicit   Brief History   4yM with hypertensive emergency in setting of acute L MCA stroke s/p tPA, AF/RVR.  Has been noncompliant with his warfarin for A. Fib. Code stroke activated, TPA administered. Intubated for respiratory failure. PCCM consulted for vent management  Significant Hospital Events   12/10 tPA administered at 1946 12/11 intubated  Consults:  PCCM  Procedures:  None  Significant Diagnostic Tests:  CTA h/n 12/10 > distal MCA branches that lose opacification and there could be a few small emboli in the left MCA territory.  CT head 12/11 > Interval demarcation of an acute cortically based infarct within the lateral left frontal lobe as described. No acute intracranial hemorrhage or significant mass effect.  MRI brain 12/12 >  1. Multifocal acute ischemia within the left MCA territory, including the left postcentral gyrus. No hemorrhage or mass effect. 2. 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 3. Chronic ischemic microangiopathy.  Micro Data:  Blood 12/10 > NGTD Urine 12/10 > insignificant growth  MRSA surveillance 12/11 > negative  SARS Coronavirus 2 12/11 > negative  Antimicrobials:  None  Interim history/subjective:  Failed SBT this morning. Not ready for extubation  Objective   Blood pressure 116/68, pulse 89, temperature 98.1 F (36.7 C), temperature source Axillary, resp. rate 17, height 5' 8.5" (1.74 m), weight 84.2 kg, SpO2 100 %.    Vent Mode: PRVC FiO2 (%):  [30 %] 30 % Set Rate:  [12 bmp] 12 bmp Vt Set:  [540 mL] 540 mL PEEP:  [5 cmH20] 5 cmH20 Pressure Support:  [10 cmH20] 10 cmH20 Plateau Pressure:  [10 cmH20-15 cmH20] 13 cmH20   Intake/Output Summary (Last 24 hours) at 01/28/2019 1309 Last data filed at 01/28/2019 1200 Gross per 24 hour  Intake  2160.9 ml  Output 400 ml  Net 1760.9 ml   Filed Weights   01/26/19 0247 01/27/19 0219 01/28/19 0600  Weight: 85.3 kg 81.9 kg 84.2 kg    Examination: General: Chronically ill appearing elderly male on mechanical ventilation, in NAD HEENT: ETT, MM pink/moist, PERRL, white coating over tongue  Neuro: Sleepy on ventilator but will arouse, not following commands for me today CV: s1s2 regular rate and rhythm, no murmur, rubs, or gallops,  PULM:  Clear to ascultation, slightly diminished to right base  GI: soft, bowel sounds active in all 4 quadrants, non-tender, non-distended, tolerating TF Extremities: warm/dry, no edema  Skin: no rashes or lesions  Resolved Hospital Problem list   Hypertensive emergency   Assessment & Plan:  Acute stroke with distal branch occlusions of left MCA -Status post tPA Plan: Management per Neuro   Acute respiratory failure due to CVA Plan: Plan for tracheostomy 12/21  With PCCM. Dr. Leonie Man will talk with trauma team about PEG placemetn.  Head of bed 30 degrees Follow intermittent CXR and ABG VAP bundle in place PAD protocol   VAP -Developed low grade fever overnight 12/15 with worsening right sided opacities on CXR sputum culture 12/16 with abundant gram positive cocci.  Plan:  Continue to follow formal culture results  Continue IV antibiotics, will finish 7 days of cefepime for suspected gram negative bacteria pna Frequent oral care  SLP once able to tolerate oral diet   Agitation, improved  Plan: Continue currently medication regiment  Minimize IV sedation as able  Low  dose Valproric acid started 12/17 for further assist in management of agitation   Atrial fibrillation with RVR -now rate contolled Plan: Continue PO Cardizem, lopressor,  and Amiodarone - increase metoprolol today.  IV heparin resumes 12/17 Continuous telemetry   Chronic benzodiazepine use Plan: Continue Risperidone, klonopin Will start stepping down klonopin over the  next couple days to help with mental status.  He is on clonidine taper  Oral thrush Plan: Nystatin suspension  Continue frequent oral care   Best practice:  Diet: N.p.o. TF per recs  Pain/Anxiety/Delirium protocol (if indicated): Precedex, fentanyl,klonopin stop versed for mental status VAP protocol (if indicated): In place DVT prophylaxis: SCDs GI prophylaxis: PPI  Glucose control: has not required coverage. D/c accu checks. Hypoglycemia protocol  Mobility: Bedrest Code Status: Full code Family Communication: I updated the patients brother. Plan is for tracheostomy and peg placement this week.  Disposition:  ICU  Labs and imaging for past 48 hours reviewed in EMR    Critical care:   The patient is critically ill with multiple organ systems failure and requires high complexity decision making for assessment and support, frequent evaluation and titration of therapies, application of advanced monitoring technologies and extensive interpretation of multiple databases.   Critical Care Time devoted to patient care services described in this note is 49 minutes. This time reflects time of care of this [name] . This critical care time does not reflect separately billable procedures or procedure time, teaching time or supervisory time of PA/NP/Med student/Med Resident etc but could involve care discussion time.  [facility] 01/28/2019 1:09 PM  Pager: [phone] After hours pager: [phone]

## 2019-01-28 NOTE — Progress Notes (Signed)
ANTICOAGULATION CONSULT NOTE - Follow-Up  Pharmacy Consult for Heparin Indication: atrial fibrillation and stroke  Allergies  Allergen Reactions  . Oxycodone Itching    Patient Measurements: Weight: 185 lb 10 oz (84.2 kg)  Ideal body weight: 69.6 kg (153 lb 5.3 oz) Adjusted ideal body weight: 75.4 kg (166 lb 4 oz)  Heparin Wt: 84.2 kg  Vital Signs: Temp: 99.2 F (37.3 C) (12/20 0712) Temp Source: Axillary (12/20 0712) BP: 105/69 (12/20 0707) Pulse Rate: 84 (12/20 0707)  Labs: Recent Labs    01/26/19 0522 01/27/19 0236 01/27/19 1011 01/27/19 1726 01/28/19 0430  HGB 11.4* 11.9*  --   --  11.6*  HCT 34.9* 36.9*  --   --  36.4*  PLT 300 357  --   --  436*  HEPARINUNFRC  --  0.39 0.55 0.23* 0.34  CREATININE 0.86  --  0.95  --  0.73    CrCl cannot be calculated (Unknown ideal weight.).  Assessment: 71 YOM off warfarin PTA for history of Afib who presented on 12/10 with an acute stroke. The patient required intubation so pharmacy consulted to dose Heparin for anticoagulation.    Heparin level early this morning was therapeutic (0.34) however a confirmatory level was SUBtherapeutic (HL 0.2, goal of 0.3-0.5). RN reports no pausing or issues with the drip, infusing appropriately, and no bleeding noted. CBC stable.    Goal of Therapy:  Heparin level 0.3 - 0.5 units/ml; no boluses Monitor platelets by anticoagulation protocol: Yes   Plan:  - Increase Heparin to 1650 units/hr (16.5 ml/hr) - Will continue to monitor for any signs/symptoms of bleeding and will follow up with heparin level in 6 hours   Thank you for allowing pharmacy to be a part of this patient's care.  Alycia Rossetti, PharmD, BCPS Clinical Pharmacist Clinical phone for 01/28/2019: [phone] 01/28/2019 8:53 AM   **Pharmacist phone directory can now be found on [email or web address] ([email or web address]).  Listed under [place].

## 2019-01-28 NOTE — Progress Notes (Signed)
ANTICOAGULATION CONSULT NOTE - Follow-Up  Pharmacy Consult for Heparin Indication: atrial fibrillation and stroke  Allergies  Allergen Reactions  . Oxycodone Itching    Patient Measurements: Height: 5' 8.5" (174 cm) Weight: 185 lb 10 oz (84.2 kg) IBW/kg (Calculated) : 69.55  Ideal body weight: 69.6 kg (153 lb 5.3 oz) Adjusted ideal body weight: 75.4 kg (166 lb 4 oz)  Heparin Wt: 84.2 kg  Vital Signs: Temp: 99.7 F (37.6 C) (12/20 1932) Temp Source: Oral (12/20 1932) BP: 110/61 (12/20 1830) Pulse Rate: 93 (12/20 1830)  Labs: Recent Labs    01/26/19 0522 01/27/19 0236 01/27/19 1011 01/28/19 0430 01/28/19 1009 01/28/19 2007  HGB 11.4* 11.9*  --  11.6*  --   --   HCT 34.9* 36.9*  --  36.4*  --   --   PLT 300 357  --  436*  --   --   HEPARINUNFRC  --  0.39 0.55 0.34 0.20* 0.34  CREATININE 0.86  --  0.95 0.73  --   --     Estimated Creatinine Clearance: 92.9 mL/min (by C-G formula based on SCr of 0.73 mg/dL).  Assessment: 29 YOM off warfarin PTA for history of Afib who presented on 12/10 with an acute stroke. The patient required intubation so pharmacy consulted to dose Heparin for anticoagulation.    Repeat heparin level therapeutic.  Goal of Therapy:  Heparin level 0.3 - 0.5 units/ml; no boluses Monitor platelets by anticoagulation protocol: Yes   Plan:  -Continue heparin 1650 units/h -Daily heparin level and CBC   Arrie Senate, PharmD, BCPS Clinical Pharmacist [phone] Please check AMION for all [place] numbers 01/28/2019

## 2019-01-29 ENCOUNTER — Inpatient Hospital Stay (HOSPITAL_COMMUNITY): Payer: Medicare Other

## 2019-01-29 LAB — BASIC METABOLIC PANEL
Anion gap: 8 (ref 5–15)
BUN: 21 mg/dL (ref 8–23)
CO2: 29 mmol/L (ref 22–32)
Calcium: 8.1 mg/dL — ABNORMAL LOW (ref 8.9–10.3)
Chloride: 110 mmol/L (ref 98–111)
Creatinine, Ser: 0.69 mg/dL (ref 0.61–1.24)
GFR calc Af Amer: >60 mL/min (ref >60)
GFR calc non Af Amer: >60 mL/min (ref >60)
Glucose, Bld: 104 mg/dL — ABNORMAL HIGH (ref 70–99)
Potassium: 3.6 mmol/L (ref 3.5–5.1)
Sodium: 147 mmol/L — ABNORMAL HIGH (ref 135–145)

## 2019-01-29 LAB — CBC
HCT: 35.6 % — ABNORMAL LOW (ref 39.0–52.0)
Hemoglobin: 11.5 g/dL — ABNORMAL LOW (ref 13.0–17.0)
MCH: 34.3 pg — ABNORMAL HIGH (ref 26.0–34.0)
MCHC: 32.3 g/dL (ref 30.0–36.0)
MCV: 106.3 fL — ABNORMAL HIGH (ref 80.0–100.0)
Platelets: 474 10*3/uL — ABNORMAL HIGH (ref 150–400)
RBC: 3.35 MIL/uL — ABNORMAL LOW (ref 4.22–5.81)
RDW: 15 % (ref 11.5–15.5)
WBC: 11.5 10*3/uL — ABNORMAL HIGH (ref 4.0–10.5)
nRBC: 0 % (ref 0.0–0.2)

## 2019-01-29 LAB — GLUCOSE, CAPILLARY
Glucose-Capillary: 92 mg/dL (ref 70–99)
Glucose-Capillary: 93 mg/dL (ref 70–99)
Glucose-Capillary: 89 mg/dL (ref 70–99)
Glucose-Capillary: 86 mg/dL (ref 70–99)
Glucose-Capillary: 96 mg/dL (ref 70–99)
Glucose-Capillary: 98 mg/dL (ref 70–99)

## 2019-01-29 LAB — HEPARIN LEVEL (UNFRACTIONATED)
Heparin Unfractionated: 0.28 IU/mL — ABNORMAL LOW (ref 0.30–0.70)
Heparin Unfractionated: <0.1 IU/mL — ABNORMAL LOW (ref 0.30–0.70)

## 2019-01-29 MED ORDER — CLONAZEPAM 0.5 MG PO TABS
0.2500 mg | ORAL_TABLET | Freq: Every day | ORAL | Status: AC
  Administered 2019-01-29: 23:00:00 0.25 mg via ORAL
  Filled 2019-01-29: qty 1

## 2019-01-29 MED ORDER — HEPARIN (PORCINE) 25000 UT/250ML-% IV SOLN
1800.0000 [IU]/h | INTRAVENOUS | Status: AC
  Administered 2019-01-29: 1800 [IU]/h via INTRAVENOUS
  Filled 2019-01-29: qty 250

## 2019-01-29 MED ORDER — CLONAZEPAM 0.5 MG PO TABS
0.2500 mg | ORAL_TABLET | Freq: Two times a day (BID) | ORAL | Status: AC
  Administered 2019-01-30 (×2): 0.25 mg via ORAL
  Filled 2019-01-29 (×2): qty 1

## 2019-01-29 MED ORDER — MIDAZOLAM HCL 2 MG/2ML IJ SOLN
INTRAMUSCULAR | Status: AC
  Filled 2019-01-29: qty 8

## 2019-01-29 MED ORDER — CLONAZEPAM 0.5 MG PO TABS
0.2500 mg | ORAL_TABLET | Freq: Every day | ORAL | Status: AC
  Administered 2019-01-31: 0.25 mg via ORAL
  Filled 2019-01-29: qty 1

## 2019-01-29 MED ORDER — FENTANYL CITRATE (PF) 100 MCG/2ML IJ SOLN
INTRAMUSCULAR | Status: AC
  Filled 2019-01-29: qty 4

## 2019-01-29 MED ORDER — ROCURONIUM BROMIDE 10 MG/ML (PF) SYRINGE
PREFILLED_SYRINGE | INTRAVENOUS | Status: AC
  Administered 2019-01-29: 50 mg
  Filled 2019-01-29: qty 10

## 2019-01-29 MED ORDER — CLONAZEPAM 0.5 MG PO TABS: 0.2500 mg | ORAL_TABLET | Freq: Every day | ORAL | Status: DC

## 2019-01-29 MED ORDER — ETOMIDATE 2 MG/ML IV SOLN
INTRAVENOUS | Status: AC
  Filled 2019-01-29: qty 10

## 2019-01-29 NOTE — Procedures (Signed)
Bronchoscopy Procedure Note [name] [id number] 11-Aug-1950  Procedure: Bronchoscopy Indications: trach adn eval of airways  Procedure Details Consent: Risks of procedure as well as the alternatives and risks of each were explained to the (patient/caregiver).  Consent for procedure obtained. Time Out: Verified patient identification, verified procedure, site/side was marked, verified correct patient position, special equipment/implants available, medications/allergies/relevent history reviewed, required imaging and test results available.  Performed  In preparation for procedure, patient was given 100% FiO2 and bronchoscope lubricated. Sedation: Benzodiazepines, Etomidate and see MAR for complete details  Airway entered and the following bronchi were examined: Bronchi.   Procedures performed: trach placement and evaluation of airways.  Bronchoscope removed.    Evaluation Hemodynamic Status: BP stable throughout; O2 sats: stable throughout Patient's Current Condition: stable Specimens:  None Complications: No apparent complications Patient did tolerate procedure well.   Audria Nine 01/29/2019

## 2019-01-29 NOTE — Progress Notes (Addendum)
SLP Cancellation Note  Patient Details Name: OCTAVIOUS HEIKE MRN: [id number] DOB: [birth year]   Cancelled treatment:     Pt remains intubated; plans for trach.  SLP service will sign off pending MS improvements and readiness for PMV s/p tracheotomy.  Tracye Szuch L. Tivis Ringer, [place] CCC/SLP Acute Rehabilitation Services Office number [phone] Pager [phone]       Juan Quam Laurice 01/29/2019, 10:25 AM

## 2019-01-29 NOTE — Progress Notes (Signed)
STROKE TEAM PROGRESS NOTE   INTERVAL HISTORY Patient remains on ventilatory support for his respiratory failure and sedated.  He remains aphasic and not following any commands consistently.  Currently on Abx and heparin IV.  Plans for tracheostomy later this morning by critical care medicine and PEG tube soon but trauma team OBJECTIVE Vitals:   01/29/19 1300 01/29/19 1400 01/29/19 1505 01/29/19 1509  BP: 104/73 103/70  99/69  Pulse: 75 67  78  Resp: (!) 0 15  14  Temp:   98 F (36.7 C)   TempSrc:   Axillary   SpO2: 100% 100%  100%  Weight:      Height:        CBC:  Recent Labs  Lab 01/28/19 0430 01/29/19 0245  WBC 11.5* 11.5*  HGB 11.6* 11.5*  HCT 36.4* 35.6*  MCV 108.7* 106.3*  PLT 436* 474*    Basic Metabolic Panel:  Recent Labs  Lab 01/24/19 2045 01/28/19 0430 01/29/19 0245  NA 144 145 147*  K 3.5 3.5 3.6  CL 111 110 110  CO2 24 26 29   GLUCOSE 132* 100* 104*  BUN 30* 22 21  CREATININE 0.77 0.73 0.69  CALCIUM 8.6* 8.1* 8.1*  MG 1.9  --   --     Lipid Panel:     Component Value Date/Time   CHOL 238 (H) 01/19/2019 0437   TRIG 64 01/19/2019 0437   HDL 43 01/19/2019 0437   CHOLHDL 5.5 01/19/2019 0437   VLDL 13 01/19/2019 0437   LDLCALC 182 (H) 01/19/2019 0437   HgbA1c:  Lab Results  Component Value Date   HGBA1C 5.1 01/19/2019   Urine Drug Screen:     Component Value Date/Time   LABOPIA NONE DETECTED 07/13/2016 1428   COCAINSCRNUR NONE DETECTED 07/13/2016 1428   LABBENZ POSITIVE (A) 07/13/2016 1428   AMPHETMU NONE DETECTED 07/13/2016 1428   THCU POSITIVE (A) 07/13/2016 1428   LABBARB NONE DETECTED 07/13/2016 1428    Alcohol Level     Component Value Date/Time   ETH <5 02/09/2015 0010    IMAGING  CT HEAD WO CONTRAST 01/24/2019 IMPRESSION: Involving acute/subacute infarcts in the left frontal parietal lobe and left occipital lobe. Small amount of hemorrhage in the left posterior frontal infarct unchanged from prior CT and MRI.  Electronically Signed   By: Franchot Gallo M.D.   On: 01/24/2019 16:18   MRI Wo Contrast  01/20/2019 IMPRESSION: 1. Multifocal acute ischemia within the left MCA territory, including the left postcentral gyrus. No hemorrhage or mass effect. 2. 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 3. Chronic ischemic microangiopathy.  CT Head Wo Contrast  01/19/2019 IMPRESSION: Interval demarcation of an acute cortically based infarct within the lateral left frontal lobe as described. No acute intracranial hemorrhage or significant mass effect.  CT Code Stroke CTA Head W/WO contrast CT Code Stroke CTA Neck W/WO contrast 01/18/2019 IMPRESSION:   Atherosclerotic disease at both carotid bifurcations but no stenosis.   Prominent soft plaque at the left ICA bulb.   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   Dominant right vertebral artery widely patent.   No intracranial large or medium vessel occlusion or correctable proximal stenosis, with specific attention to the left MCA branches.   I do think there are a few distal MCA branches that lose opacification and there could be a few small emboli in the left MCA territory. Vessels marked with arrows.   Distal vessel  atherosclerotic irregularity, particularly evident in the PCA branches.   Aortic Atherosclerosis (ICD10-I70.0)   Emphysema (ICD10-J43.9).   CT HEAD CODE STROKE WO CONTRAST 01/18/2019  IMPRESSION: 1. No acute finding by CT. Chronic small-vessel ischemic changes of the white matter. Old right cerebellar and right occipital infarctions.  2. ASPECTS is 10   Transthoracic Echocardiogram  01/19/2019 IMPRESSIONS  1. Left ventricular ejection fraction, by visual estimation, is 55 to 60%. The left ventricle has normal function. There is moderately increased left ventricular hypertrophy.  2. The left ventricle demonstrates regional wall motion abnormalities.  3.  Global right ventricle has normal systolic function.The right ventricular size is normal. No increase in right ventricular wall thickness.  4. Left atrial size was normal.  5. Right atrial size was mildly dilated.  6. The mitral valve is normal in structure. Mild to moderate mitral valve regurgitation.  7. The tricuspid valve is normal in structure. Tricuspid valve regurgitation is trivial.  8. The aortic valve is normal in structure. Aortic valve regurgitation is not visualized.  9. The pulmonic valve was normal in structure. Pulmonic valve regurgitation is not visualized. 10. The atrial septum is grossly normal.  ECG atrial fibrillation - ventricular response 133 BPM (See cardiology reading for complete details)  PHYSICAL EXAM  Blood pressure 99/69, pulse 78, temperature 98 F (36.7 C), temperature source Axillary, resp. rate 14, height 5' 8.5" (1.74 m), weight 84.8 kg, SpO2 100 %.  General -frail elderly Caucasian male, intubated on sedation  Ophthalmologic - fundi not visualized due to noncooperation.  Cardiovascular - irregularly irregular heart rate and rhythm.  Neuro - intubated  on sedation, eyes closed, not following commands.  Globally aphasic.  Barely open eyes with pain stimulation. With forced eye opening, pt resisted eye opening bilaterally, eyes in mid position, not blinking to visual threat bilaterally, not tracking, pupil bilaterally 2 mm, sluggish to light. Doll's eyes sluggish, corneal reflex weakly present L>R, gag and cough present.  Breathing over the vent.  Facial symmetry not able to test due to ET tube. On pain stimulation, no movement of right extremity but has some tone and movement in the left upper extremity, slight toe and leg movement at BLEs. Sensation, coordination and gait not tested.   ASSESSMENT/PLAN Mr. ELWAY MCCULLUM is a 68 y.o. male with history of tobacco use, previous strokes, COPD, AAA, ASPVD, htn, hld, pulmonary nodule, depression, atrial  fibrillation on coumadin but recently missed doses presenting with transient aphasia and leaning to the right. The patient received IV t-PA Thursday 01/18/19 @ 2000.  Stroke - left MCA infarct s/p t-PA - embolic pattern, likely due to atrial fibrillation with subtherapeutic INR  Resultant global aphasia, left gaze, right hemiparesis, right neglect and agitation  Code Stroke CT Head - No acute finding by CT. Chronic small-vessel ischemic changes of the white matter. Old right cerebellar and right occipital infarctions. ASPECTS is 10   CT head - Interval demarcation of an acute cortically based infarct within the lateral left frontal lobe as described. No acute intracranial hemorrhage or significant mass effect.  MRI head -  Multifocal acute ischemia within the left MCA territory, including the left postcentral gyrus. No hemorrhage or mass effect. 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 Chronic ischemic microangiopathy.  CTA H&N - Prominent soft plaque at the left ICA bulb. 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   CT 12/16 stable, continued evolution of left MCA infarcts  2D Echo -  EF 55 to 60%. No cardiac source of emboli identified.   [employer] Virus 2 - negative  LDL - 182  HgbA1c - 5.1  VTE prophylaxis -Heparin subq  warfarin daily prior to admission, now on heparin IV  Therapy recommendations:  Signed off, reorder once off vent  Disposition:  Pending  Afib with RVR  On amiodarone and Cardizem drip  Still has RVR due to agitation  On lopressor 25 bid (on 100 bid at home) increase as needed    On warfarin PTA, INR 1.1 on admission  On aspirin for now  On heparin IV  AMS with agitation, chronic benzo use Respiratory failure with hypoxia  Intubated   Weaned off precedex and fentanyl and versed  On resperidone 1mg  bid, klonopin 0.5 bid, oxycodone 5 mb q6h  CXR Increased right pleural  effusion with associated right basilar atelectasis or infiltrate  Tachypnea on SBT received bolus sedation  CCM on board  Extubate as able  B12 deficiency  Vitamin B12 = 161  B12 supplement  History of stroke  11/2014, bilateral blurry vision after left upper extremity bypass surgery.  Found to have left central vision loss.  MRI showed bilateral embolic infarcts.  INR 1.45.  Carotid Doppler negative.  TTE unremarkable.  LDL 82, A1c 5.4.  Continued on discharge with Coumadin and Lipitor 40  hypotension Hx of hypertension  Home BP meds: Diltiazem, Lasix and metoprolol  Current BP meds clonidine 0.3 q 6h , metoprolol 25 bid  BP stable  Off neo . Avoid low BP  Hyperlipidemia  Home Lipid lowering medication: Lipitor 40 mg daily  LDL 182, goal < 70  Current lipid lowering medication: Lipitor 80 mg daily   Continue statin at discharge  Dysphagia  N.p.o.  Speech to follow once extubated  P.o. meds through tube  On bolus feed  VAP Fever and leukocytosis  T-max 101.7->100.2->100.4->101.5->99.6  leucocytosis - 11.1->11.7->11.0->14.0->13.4->12.6->10.0->13.4->7.8->9.0  CXR with increased R pleural effusion w/ R basilar atx/infiltrate  UA neg 01/18/19  On cefepime (started 01/26/19 x 7 days) for S.pneumoniae and E.cloacae in sputum  CCM on board  Tobacco abuse  Current smoker  Smoking cessation counseling will be provided  Pt is willing to quit  Other Stroke Risk Factors  Advanced age  ETOH use, advised to drink no more than 1 alcoholic beverage per day.  Other Active Problems  Hypokalemia 3.3->3.4->3.3->3.1->4.0->4.3->4.4->4.4->4.0->3.4->3.7  Oral thrush on nystatin  Mild Anemia - Hb - 11.9   Hospital day # 11 Continue IV heparin till patient has a trach and PEG tube   and can be switched to warfarin.  Patient prognosis remains poor given his significant aphasia and hemiparesis is unlikely to survive without prolong feeding tube and nursing  care.  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  Discussed with Dr. Shearon Stalls critical care medicine.  No family available at the bedside for discussion. This patient is critically ill and at significant risk of neurological worsening, death and care requires constant monitoring of vital signs, hemodynamics,respiratory and cardiac monitoring, extensive review of multiple databases, frequent neurological assessment, discussion with family, other specialists and medical decision making of high complexity. I spent 30 minutes of neurocritical care time  in the care of  this patient.  Antony Contras, MD To  contact Stroke Continuity provider, please refer to [email or web address] After hours, contact General Neurology

## 2019-01-29 NOTE — Procedures (Signed)
Procedure: Percutaneous Tracheostomy CPT 31600 Performed by: Dr. Erskine Emery Bronchoscopy Assistant: Dr. Ruthann Cancer.  Indications: Chronic respiratory failure and need for ongoing mechanical ventilation.  Consent: Given patient's intubation and sedation, the patient was unable to provide consent. Discussed the procedure with the patient's decision maker, including the indications, risks, benefits, and alternatives. All questions were answered. Verbal witnessed consent was obtained and placed in the chart.  Preprocedure: Universal protocol was followed for this procedure. Prior to the initiation of sedation or the procedure, a timeout/"Pause for the Cause" was performed. The patient's identity was verified by confirming the patient's wrist band for name, date of birth, and medical record number. Everyone in the room was in agreement with the patient identify, the procedure to be performed, consent was in place and matched the planned procedure, and the procedure site. The area was cleaned with a CHG scrub and draped with large sterile barrier. Hand hygiene was performed, and cap, mask, sterile gown, and sterile gloves were worn. The patient was covered by a large sterile drape. Sterile technique was maintained for the entire procedure.  Anesthesia: The patient was intubated and sedated prior to the procedure. Additional midazolam, etomidate, fentanyl and rocuronium were given for sedation and paralysis with close attention to vital signs throughout procedure. Please refer to the accompanying procedural sedation form for additional details. Once the patient was adequately sedated and with continuous BIS monitoring, vecuronium was administered for paralysis.  Procedure: The patient was placed in the supine position. The anterior neck was prepped and draped in usual sterile fashion. 1% lidocaine was administered approximately 2 fingerbreadths above the sternal notch for local anesthesia. A 1.5-cm vertical  incision was then performed 2 fingerbreadths above the sternal notch. Using a curved Kelly, blunt dissection was performed down to the level of the pretracheal fascia. At this point, the bronchoscope was introduced through the endotracheal tube and the trachea was properly visualized. The endotracheal tube was then gradually withdrawn within the trachea under direct bronchoscopic visualization. Proper midline position was confirmed by bouncing the needle from the tracheostomy tray over the trachea with bronchoscopic examination. The needle was advanced into the trachea and proper positioning was confirmed with direct visualization. The needle was then removed leaving a white outer cannula in position. The wire from the tracheostomy tray was then advanced through the white outer cannula. The cannula was then removed. The small, blue dilator was then advanced over the wire into the trachea. Once proper dilatation was achieved, the dilator was removed. The large, tapered dilator was then advanced over the wire into the trachea. The dilator was removed leaving the wire and white inner cannula in position. A number 6-0 percutaneous Shiley tracheostomy tube was then advanced over the wire and white inner cannula into the trachea. Proper positioning was confirmed with bronchoscopic visualization. The tracheostomy tube was then sutured in place with four nylon sutures. It was further secured with a tracheostomy tie.  Estimated blood loss: Less than 5 mL.  Complications: None immediate.  CXR ordered.

## 2019-01-29 NOTE — Progress Notes (Signed)
NAME:  [name], MRN:  [id number], DOB:  17-May-1950, LOS: 57 ADMISSION DATE:  01/18/2019, CONSULTATION DATE:  01/18/19 REFERRING MD:  Leonel Ramsay, CHIEF COMPLAINT:  Unable to elicit   Brief History   47yM with hypertensive emergency in setting of acute L MCA stroke s/p tPA, AF/RVR.  Has been noncompliant with his warfarin for A. Fib. Code stroke activated, TPA administered. Intubated for respiratory failure. PCCM consulted for vent management  Significant Hospital Events   12/10 tPA administered at 1946 12/11 intubated  Consults:  PCCM  Procedures:  None  Significant Diagnostic Tests:  CTA h/n 12/10 > distal MCA branches that lose opacification and there could be a few small emboli in the left MCA territory.  CT head 12/11 > Interval demarcation of an acute cortically based infarct within the lateral left frontal lobe as described. No acute intracranial hemorrhage or significant mass effect.  MRI brain 12/12 >  1. Multifocal acute ischemia within the left MCA territory, including the left postcentral gyrus. No hemorrhage or mass effect. 2. 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 3. Chronic ischemic microangiopathy.  Micro Data:  Blood 12/10 > NGTD Urine 12/10 > insignificant growth  MRSA surveillance 12/11 > negative  SARS Coronavirus 2 12/11 > negative  Antimicrobials:  Cefepime 12/18>>  Interim history/subjective:  No overnight issues. Resting comfortably.   Objective   Blood pressure 124/74, pulse (!) 112, temperature 98.6 F (37 C), temperature source Oral, resp. rate 17, height 5' 8.5" (1.74 m), weight 84.8 kg, SpO2 94 %.    Vent Mode: PRVC FiO2 (%):  [30 %] 30 % Set Rate:  [12 bmp] 12 bmp Vt Set:  [540 mL] 540 mL PEEP:  [5 cmH20] 5 cmH20 Plateau Pressure:  [13 cmH20-15 cmH20] 13 cmH20   Intake/Output Summary (Last 24 hours) at 01/29/2019 [id number] Last data filed at 01/29/2019 0800 Gross per 24 hour  Intake 1794.09 ml  Output 1075 ml  Net  719.09 ml   Filed Weights   01/27/19 0219 01/28/19 0600 01/29/19 0443  Weight: 81.9 kg 84.2 kg 84.8 kg    Examination: General: Chronically ill appearing elderly male on mechanical ventilation, in NAD HEENT: ETT, MM pink/moist, PERRL, white coating over tongue  Neuro: intubated, resting comfortably CV: s1s2 regular rate and rhythm, no murmur, rubs, or gallops,  PULM:  Clear to ascultation, slightly diminished to right base  GI: soft, bowel sounds active in all 4 quadrants, non-tender, non-distended, tolerating TF bolus Extremities: warm/dry, no edema  Skin: no rashes or lesions  Resolved Hospital Problem list   Hypertensive emergency   Assessment & Plan:  Acute stroke with distal branch occlusions of left MCA -Status post tPA Plan: Management per Neuro   Acute respiratory failure due to CVA Plan: Plan for tracheostomy today at bedside With PCCM. Dr. Leonie Man will talk with trauma team about PEG placement. Currently tolerating TF through NG tube.  Head of bed 30 degrees Follow intermittent CXR and ABG VAP bundle in place PAD protocol   VAP -Developed low grade fever overnight 12/15 with worsening right sided opacities on CXR sputum culture 12/16 with abundant gram positive cocci.  Plan:  Continue to follow formal culture results  Continue IV antibiotics, will finish 7 days of cefepime for suspected gram negative bacteria pna Frequent oral care  SLP once able to tolerate oral diet   Agitation, improved  Plan: Continue currently medication regiment  Minimize IV sedation as able  Low dose Valproric acid started 12/17 for further assist  in management of agitation   Atrial fibrillation with RVR -now rate contolled Plan: Continue PO Cardizem, lopressor,  and Amiodarone IV heparin Continuous telemetry   Chronic benzodiazepine use Plan: Continue Risperidone, klonopin Continue tapering klonopin. Today he is adjusted to receive 0.5mg  in am/0.25mg  in pm. Taper ordered for  the rest of the week.  He is on clonidine taper as well after being titrated off precedex.   Oral thrush Plan: Nystatin suspension  Continue frequent oral care   Best practice:  Diet: N.p.o. TF per recs getting boluses.  Pain/Anxiety/Delirium protocol (if indicated): Precedex, fentanyl,klonopin stop versed for mental status VAP protocol (if indicated): In place DVT prophylaxis: SCDs GI prophylaxis: PPI  Glucose control: has not required coverage. D/c accu checks. Hypoglycemia protocol  Mobility: Bedrest Code Status: Full code Family Communication: I updated the patients brother 12/20. Plan is for tracheostomy and peg placement this week.  Disposition:  ICU  Labs and imaging for past 48 hours reviewed in EMR    Critical care:   The patient is critically ill with multiple organ systems failure and requires high complexity decision making for assessment and support, frequent evaluation and titration of therapies, application of advanced monitoring technologies and extensive interpretation of multiple databases.   Critical Care Time devoted to patient care services described in this note is 33 minutes. This time reflects time of care of this [place] . This critical care time does not reflect separately billable procedures or procedure time, teaching time or supervisory time of PA/NP/Med student/Med Resident etc but could involve care discussion time.  [facility] 01/29/2019 8:33 AM  Pager: [phone] After hours pager: [phone]

## 2019-01-29 NOTE — Procedures (Signed)
Cortrak  Person Inserting Tube:  [name], [name], RD Tube Type:  Cortrak - 43 inches Tube Location:  Right nare Initial Placement:  Stomach Secured by: Bridle Technique Used to Measure Tube Placement:  Documented cm marking at nare/ corner of mouth Cortrak Secured At:  74 cm    X-ray is required, abdominal x-ray has been ordered by the Cortrak team. Please confirm tube placement before using the Cortrak tube.   If the tube becomes dislodged please keep the tube and contact the Cortrak team at [email or web address] ([email or web address]) for replacement.  If after hours and replacement cannot be delayed, place a NG tube and confirm placement with an abdominal x-ray.    [name] RD, LDN Clinical Nutrition Pager # [phone]

## 2019-01-29 NOTE — Consult Note (Signed)
[facility] Surgery Consult Note  [name] 1950-04-09  [id number].    Requesting MD: Leonie Man Chief Complaint/Reason for Consult: PEG placement  HPI:  Patient is a 68 year old male admitted 12/10 with hypertensive emergency in setting of acute L MCA stroke. S/P tPA 12/10. Hx of atrial fibrillation, non-compliant with warfarin. Patient was intubated for respiratory failure and critical care consulted. Patient is currently aphasic and not following commands. Tracheostomy is planned for today (01/29/19) by CCM. Neurology has spoken with patient's brother who wants full support. Patient is tolerating tube feedings and having bowel function. Patient currently on heparin which has been held for procedure today.   No past abdominal surgery in chart, no abdominal scars noted. Allergy to oxycodone listed.   ROS: Review of Systems  Unable to perform ROS: Intubated    Family History  Problem Relation Age of Onset  . Hypertension Mother   . Lung cancer Mother   . Stomach cancer Father        died age 19  . Cancer Sister   . Early death Neg Hx   . Heart disease Neg Hx   . Hyperlipidemia Neg Hx   . Kidney disease Neg Hx   . Stroke Neg Hx     Past Medical History:  Diagnosis Date  . AAA (abdominal aortic aneurysm) ([place])    per last CT 03-26-2016---  3.9cm  . Anticoagulated on Coumadin   . Arthritis of spine   . Benign localized prostatic hyperplasia with lower urinary tract symptoms (LUTS)   . Chronic atrial fibrillation ([place]) cardiolgoist -- dr Stanford Breed   dx 2009  . Complication of anesthesia    per pt today (07-08-2016) had problem after having anesthesia 12/ 2016 surgery at [facility] "not good" went to ED ( in epic ED visit 01/ 2017 dx severe episode of major depression disorder and admitted to [facility]  . COPD with emphysema ([place])   . Dyspnea on exertion   . Full dentures   . GERD (gastroesophageal reflux disease)   . Hematuria   . History of amaurosis fugax    right eye  02-08-2015 -- resolved /  documented in epic possible left eye amaurosis fugax 11/ 2008 (pt denies) / per last MRI show previous bilateral occipital lobe infarcts  . History of concussion    as child  . History of CVA (cerebrovascular accident) 10/ 2016  and per pt Dec 2017 in [place]   neurologist-  dr Leonie Man-- per note silent infarcts on MRI-- multiple acute infarcts bilateral cerebullm, right temporal lobe, and bilateral occipital lobes due to embolic phenomena (atrial fib.)  . History of seizures as a child   . Hyperlipidemia   . Hypertension   . Left arm numbness    occasional numbess post residual axilla arterial occlusion and surgery  . Major depressive disorder    hx severe episode without psychosis 02/2015 w/ homicidal ideation  . Narcotic dependence ([place])   . PAD (peripheral artery disease) ([place])    followed by dr fields-- s/p  left axilla to branchial and left axilla to ulnar bypass graft due to ischemic hand  . Pulmonary nodule    per CT 03-26-2016  right lower lobe  . PVD (peripheral vascular disease) ([place])   . Right ureteral stone   . Senile purpura ([place])   . Weakness of left arm    residual from axilla arterial occlusion and post surgery  . Wears glasses     Past Surgical History:  Procedure Laterality Date  . ARCH AORTOGRAM N/A 04/05/2014   Procedure: ARCH AORTOGRAM, FIRST ORDER CATHETERIZATION LEFT SUBCLAVIAN ARTERY;  Surgeon: Elam Dutch, MD;  Location: [facility] OR;  Service: Vascular;  Laterality: N/A;  . AXILLARY-FEMORAL BYPASS GRAFT Left 04/08/2014   Procedure: LEFT AXILLARY ARTERY TO BRACHIAL ARTERY BYPASS USING NON REVERSE LEFT GREATER SAPHENOUS VEIN ,LIGATION OF LEFT AXILLARY ARTERY ANEURYSM;  Surgeon: Elam Dutch, MD;  Location: [place];  Service: Vascular;  Laterality: Left;  . BYPASS AXILLA/BRACHIAL ARTERY Left 01/27/2015   Procedure: LEFT BRACHIAL-ULAR ARTERY BYPASS USING GREATER SAPHENOUS VEIN;  Surgeon: Elam Dutch, MD;  Location: [place];  Service:  Vascular;  Laterality: Left;  . CYSTOSCOPY WITH RETROGRADE PYELOGRAM, URETEROSCOPY AND STENT PLACEMENT Right 07/17/2016   Procedure: CYSTOSCOPY WITH RETROGRADE PYELOGRAM, URETEROSCOPY AND STENT PLACEMENT,LASER;  Surgeon: Festus Aloe, MD;  Location: [facility];  Service: Urology;  Laterality: Right;  . CYSTOSCOPY/URETEROSCOPY/HOLMIUM LASER/STENT PLACEMENT Right 07/13/2016   Procedure: CYSTOSCOPY, RIGHT URETEROSCOPY RETROGRADE PYELOGRAM;  Surgeon: Kathie Rhodes, MD;  Location: [facility];  Service: Urology;  Laterality: Right;  . EMBOLECTOMY Left 04/05/2014   Procedure: THROMBO ENDARTERECTOMY OF LEFT BRACHIAL, RADIAL AND ULNAR ARTERY,  Left Radial artery cut down and radial artery thrombectomy.;  Surgeon: Elam Dutch, MD;  Location: [facility] OR;  Service: Vascular;  Laterality: Left;  . facial cyst Right    cyst removal.   . PATCH ANGIOPLASTY Left 04/05/2014   Procedure: LEFT ARM VEIN PATCH ANGIOPLASTY;  Surgeon: Elam Dutch, MD;  Location: [place];  Service: Vascular;  Laterality: Left;  . PERIPHERAL VASCULAR CATHETERIZATION N/A 01/22/2015   Procedure: Aortic Arch Angiography;  Surgeon: Elam Dutch, MD;  Location: [place] [facility];  Service: Cardiovascular;  Laterality: N/A;  . THROMBECTOMY BRACHIAL ARTERY Left 11/20/2014   Procedure: 1.  Thrombectomy Left Axilo-Brachial Bypass  2.  Thromboendarterecotmy of Left Brachial Artery with Fogarty Thrombectomy of Radial and Ulnar Arteries with Dacron patch angioplasty Left Brachial Artery. 3. Intraoperative  Arteriogram times four.;  Surgeon: Mal Misty, MD;  Location: [place];  Service: Vascular;  Laterality: Left;  . TRANSTHORACIC ECHOCARDIOGRAM  11/25/2014   moderate concentric LVH, ef 60-65%, unable to evaluation LVDF due to atrial fib/  mild MR/  severe LAE  . VEIN HARVEST Left 04/08/2014   Procedure: LEFT GREATER SAPHENOUS VEIN HARVEST;  Surgeon: Elam Dutch, MD;  Location: [place];  Service: Vascular;  Laterality: Left;  Marland Kitchen  VEIN HARVEST Right 01/27/2015   Procedure: RIGHT GREATER SAPHENOUS VEIN HARVEST;  Surgeon: Elam Dutch, MD;  Location: [place];  Service: Vascular;  Laterality: Right;    Social History:  reports that he has been smoking cigarettes. He has a 102.00 pack-year smoking history. He has never used smokeless tobacco. He reports current alcohol use. He reports that he does not use drugs.  Allergies:  Allergies  Allergen Reactions  . Oxycodone Itching    Medications Prior to Admission  Medication Sig Dispense Refill  . acetaminophen (TYLENOL) 500 MG tablet Take 500 mg by mouth every 6 (six) hours as needed for mild pain.     Marland Kitchen ALPRAZolam (XANAX) 0.5 MG tablet Take 0.5 mg by mouth at bedtime as needed for anxiety or sleep.     Marland Kitchen atorvastatin (LIPITOR) 40 MG tablet Take 1 tablet (40 mg total) by mouth daily. For high cholesterol 90 tablet 3  . calcium carbonate (TUMS - DOSED IN MG ELEMENTAL CALCIUM) 500 MG chewable tablet Chew 1 tablet  by mouth 2 (two) times daily with a meal.     . diltiazem (TIAZAC) 240 MG 24 hr capsule TAKE ONE (1) CAPSULE BY MOUTH EACH DAY (Patient taking differently: Take 240 mg by mouth daily. ) 90 capsule 1  . furosemide (LASIX) 20 MG tablet Take 20 mg by mouth daily.     Marland Kitchen gabapentin (NEURONTIN) 100 MG capsule Take 1 capsule (100 mg total) by mouth at bedtime. For agitation 30 capsule 0  . losartan (COZAAR) 50 MG tablet Take 50 mg by mouth daily.    . metoprolol tartrate (LOPRESSOR) 100 MG tablet Take 1 tablet (100 mg total) by mouth 2 (two) times daily. <PLEASE MAKE APPOINTMENT FOR REFILLS> 60 tablet 0  . traMADol (ULTRAM) 50 MG tablet Take 50-100 mg by mouth every 6 (six) hours as needed for moderate pain.     Marland Kitchen warfarin (COUMADIN) 5 MG tablet Take 1 tablet (5 mg total) by mouth daily at 6 PM. Take as directed.  Dose may vary pending INR: For blood clot prevention      Blood pressure 124/74, pulse (!) 112, temperature 98.6 F (37 C), temperature source Oral, resp. rate  17, height 5' 8.5" (1.74 m), weight 84.8 kg, SpO2 94 %. Physical Exam: Physical Exam Constitutional:      General: He is not in acute distress.    Appearance: He is well-developed.     Interventions: He is intubated.  HENT:     Head: Normocephalic.     Right Ear: External ear normal.     Left Ear: External ear normal.     Nose: Nose normal.     Mouth/Throat:     Mouth: Oral lesions (thrush) present.  Eyes:     General: No scleral icterus.    Conjunctiva/sclera:     Right eye: Chemosis present.     Pupils: Pupils are equal, round, and reactive to light.  Cardiovascular:     Rate and Rhythm: Normal rate and regular rhythm.     Pulses:          Radial pulses are 2+ on the right side and 2+ on the left side.       Dorsalis pedis pulses are 2+ on the right side and 2+ on the left side.  Pulmonary:     Effort: He is intubated.     Breath sounds: No decreased breath sounds, wheezing, rhonchi or rales.  Abdominal:     General: There is no distension.     Palpations: Abdomen is soft. There is no hepatomegaly or splenomegaly.     Tenderness: There is no abdominal tenderness. There is no guarding.     Hernia: No hernia is present.  Genitourinary:    Comments: Condom catheter Musculoskeletal:     Cervical back: Neck supple.     Right lower leg: No edema.     Left lower leg: No edema.     Comments: No obvious deformity of all 4 extremities  Skin:    General: Skin is warm and dry.     Findings: No rash.  Neurological:     Comments: Patient opened eyes to tactile stimulation but did not track me or follow commands  Psychiatric:        Behavior: Behavior is uncooperative.     Results for orders placed or performed during the hospital encounter of 01/18/19 (from the past 48 hour(s))  Heparin level (unfractionated)     Status: None   Collection Time: 01/27/19 10:11 AM  Result Value Ref Range   Heparin Unfractionated 0.55 0.30 - 0.70 IU/mL    Comment: (NOTE) If heparin results are  below expected values, and patient dosage has  been confirmed, suggest follow up testing of antithrombin III levels. Performed at [facility], [place] [address], [place], [place] [id number]   Basic metabolic panel     Status: Abnormal   Collection Time: 01/27/19 10:11 AM  Result Value Ref Range   Sodium 144 135 - 145 mmol/L   Potassium 4.4 3.5 - 5.1 mmol/L    Comment: SLIGHT HEMOLYSIS   Chloride 111 98 - 111 mmol/L   CO2 24 22 - 32 mmol/L   Glucose, Bld 140 (H) 70 - 99 mg/dL   BUN 21 8 - 23 mg/dL   Creatinine, Ser 0.95 0.61 - 1.24 mg/dL   Calcium 8.7 (L) 8.9 - 10.3 mg/dL   GFR calc non Af Amer >60 >60 mL/min   GFR calc Af Amer >60 >60 mL/min   Anion gap 9 5 - 15    Comment: Performed at [facility], [place] [address] [place], [place] [address]  Glucose, capillary     Status: Abnormal   Collection Time: 01/27/19 11:10 AM  Result Value Ref Range   Glucose-Capillary 126 (H) 70 - 99 mg/dL  Glucose, capillary     Status: Abnormal   Collection Time: 01/27/19  3:15 PM  Result Value Ref Range   Glucose-Capillary 122 (H) 70 - 99 mg/dL  Heparin level (unfractionated)     Status: Abnormal   Collection Time: 01/27/19  5:26 PM  Result Value Ref Range   Heparin Unfractionated 0.23 (L) 0.30 - 0.70 IU/mL    Comment: (NOTE) If heparin results are below expected values, and patient dosage has  been confirmed, suggest follow up testing of antithrombin III levels. Performed at [facility], [place] [address], [place], [place] [address]   Glucose, capillary     Status: Abnormal   Collection Time: 01/27/19  7:33 PM  Result Value Ref Range   Glucose-Capillary 150 (H) 70 - 99 mg/dL   Comment 1 Notify RN   Glucose, capillary     Status: Abnormal   Collection Time: 01/28/19 12:21 AM  Result Value Ref Range   Glucose-Capillary 128 (H) 70 - 99 mg/dL  Glucose, capillary     Status: None   Collection Time: 01/28/19  4:09 AM  Result Value Ref Range   Glucose-Capillary 99 70 - 99 mg/dL    Comment 1 Document in Chart   CBC     Status: Abnormal   Collection Time: 01/28/19  4:30 AM  Result Value Ref Range   WBC 11.5 (H) 4.0 - 10.5 K/uL   RBC 3.35 (L) 4.22 - 5.81 MIL/uL   Hemoglobin 11.6 (L) 13.0 - 17.0 g/dL   HCT 36.4 (L) 39.0 - 52.0 %   MCV 108.7 (H) 80.0 - 100.0 fL   MCH 34.6 (H) 26.0 - 34.0 pg   MCHC 31.9 30.0 - 36.0 g/dL   RDW 15.4 11.5 - 15.5 %   Platelets 436 (H) 150 - 400 K/uL   nRBC 0.0 0.0 - 0.2 %    Comment: Performed at  Hospital Lab, [place] [address] [place], [place] [address]  Heparin level (unfractionated)     Status: None   Collection Time: 01/28/19  4:30 AM  Result Value Ref Range   Heparin Unfractionated 0.34 0.30 - 0.70 IU/mL    Comment: (NOTE) If  heparin results are below expected values, and patient dosage has  been confirmed, suggest follow up testing of antithrombin III levels. Performed at [facility], [place] [address], [place], [place] [id number]   Basic metabolic panel     Status: Abnormal   Collection Time: 01/28/19  4:30 AM  Result Value Ref Range   Sodium 145 135 - 145 mmol/L   Potassium 3.5 3.5 - 5.1 mmol/L   Chloride 110 98 - 111 mmol/L   CO2 26 22 - 32 mmol/L   Glucose, Bld 100 (H) 70 - 99 mg/dL   BUN 22 8 - 23 mg/dL   Creatinine, Ser 0.73 0.61 - 1.24 mg/dL   Calcium 8.1 (L) 8.9 - 10.3 mg/dL   GFR calc non Af Amer >60 >60 mL/min   GFR calc Af Amer >60 >60 mL/min   Anion gap 9 5 - 15    Comment: Performed at [place] [address] [place], [place] [address]  Glucose, capillary     Status: None   Collection Time: 01/28/19  7:11 AM  Result Value Ref Range   Glucose-Capillary 92 70 - 99 mg/dL  Heparin level (unfractionated)     Status: Abnormal   Collection Time: 01/28/19 10:09 AM  Result Value Ref Range   Heparin Unfractionated 0.20 (L) 0.30 - 0.70 IU/mL    Comment: (NOTE) If heparin results are below expected values, and patient dosage has  been confirmed, suggest follow up testing of antithrombin III  levels. Performed at [facility], [place] [address] [place], [place] [address]   Glucose, capillary     Status: Abnormal   Collection Time: 01/28/19 11:06 AM  Result Value Ref Range   Glucose-Capillary 115 (H) 70 - 99 mg/dL  Glucose, capillary     Status: Abnormal   Collection Time: 01/28/19  3:07 PM  Result Value Ref Range   Glucose-Capillary 135 (H) 70 - 99 mg/dL  Glucose, capillary     Status: None   Collection Time: 01/28/19  7:31 PM  Result Value Ref Range   Glucose-Capillary 86 70 - 99 mg/dL  Heparin level (unfractionated)     Status: None   Collection Time: 01/28/19  8:07 PM  Result Value Ref Range   Heparin Unfractionated 0.34 0.30 - 0.70 IU/mL    Comment: (NOTE) If heparin results are below expected values, and patient dosage has  been confirmed, suggest follow up testing of antithrombin III levels. Performed at [facility], [place] [address], [place], [place] [address]   Glucose, capillary     Status: Abnormal   Collection Time: 01/28/19 11:35 PM  Result Value Ref Range   Glucose-Capillary 144 (H) 70 - 99 mg/dL  CBC     Status: Abnormal   Collection Time: 01/29/19  2:45 AM  Result Value Ref Range   WBC 11.5 (H) 4.0 - 10.5 K/uL   RBC 3.35 (L) 4.22 - 5.81 MIL/uL   Hemoglobin 11.5 (L) 13.0 - 17.0 g/dL   HCT 35.6 (L) 39.0 - 52.0 %   MCV 106.3 (H) 80.0 - 100.0 fL   MCH 34.3 (H) 26.0 - 34.0 pg   MCHC 32.3 30.0 - 36.0 g/dL   RDW 15.0 11.5 - 15.5 %   Platelets 474 (H) 150 - 400 K/uL   nRBC 0.0 0.0 - 0.2 %    Comment: Performed at [facility], [place] [address] [place], [place] [address]  Heparin level (unfractionated)  Status: Abnormal   Collection Time: 01/29/19  2:45 AM  Result Value Ref Range   Heparin Unfractionated 0.28 (L) 0.30 - 0.70 IU/mL    Comment: (NOTE) If heparin results are below expected values, and patient dosage has  been confirmed, suggest follow up testing of antithrombin III levels. Performed at [facility], [place]  [address], [place], [place] [id number]   Basic metabolic panel     Status: Abnormal   Collection Time: 01/29/19  2:45 AM  Result Value Ref Range   Sodium 147 (H) 135 - 145 mmol/L   Potassium 3.6 3.5 - 5.1 mmol/L   Chloride 110 98 - 111 mmol/L   CO2 29 22 - 32 mmol/L   Glucose, Bld 104 (H) 70 - 99 mg/dL   BUN 21 8 - 23 mg/dL   Creatinine, Ser 0.69 0.61 - 1.24 mg/dL   Calcium 8.1 (L) 8.9 - 10.3 mg/dL   GFR calc non Af Amer >60 >60 mL/min   GFR calc Af Amer >60 >60 mL/min   Anion gap 8 5 - 15    Comment: Performed at [place] [address], [place],  [address]  Glucose, capillary     Status: None   Collection Time: 01/29/19  3:50 AM  Result Value Ref Range   Glucose-Capillary 92 70 - 99 mg/dL  Glucose, capillary     Status: None   Collection Time: 01/29/19  7:41 AM  Result Value Ref Range   Glucose-Capillary 93 70 - 99 mg/dL   VAS [place] UPPER EXTREMITY ARTERIAL DUPLEX  Result Date: 01/29/2019 UPPER EXTREMITY DUPLEX STUDY Indications: Patient complains of cold hand.  Other Factors: CVA, subtherapeutic INR, Atrial fibrillation. Limitations: Patient ventilation, arm positioning, and constant arm movement. Comparison Study: No prior study on file for comparison Performing Technologist: Sharion Dove RVS  Examination Guidelines: A complete evaluation includes B-mode imaging, spectral Doppler, color Doppler, and power Doppler as needed of all accessible portions of each vessel. Bilateral testing is considered an integral part of a complete examination. Limited examinations for reoccurring indications may be performed as noted.  Left Doppler Findings: +-----------+----------+-------------------+------+-------------------+ Site       PSV (cm/s)Waveform           PlaqueComments            +-----------+----------+-------------------+------+-------------------+ Subclavian           triphasic                patent               +-----------+----------+-------------------+------+-------------------+ Axillary                                      thrombus            +-----------+----------+-------------------+------+-------------------+ Brachial                                      thrombus            +-----------+----------+-------------------+------+-------------------+ Radial     22        dampened monophasic                          +-----------+----------+-------------------+------+-------------------+ Ulnar  could not visualize +-----------+----------+-------------------+------+-------------------+ Palmar Arch                                   could not visualize +-----------+----------+-------------------+------+-------------------+  Summary:  Left: There appears to be arterial thrombus noted in the left       axillary and brachial arteries. There are dampened waveforms       noted in the proximal radial artery. Unable to visualize ulnar       artery, mid to distal radial artery, or palmar arch secondary       to patient's positioning and constant movement of arm.       Incidentally, there is DVT noted in the left brachial and       axillary veins and acute SVT noted in the left cephalic and       basilic veins. *See table(s) above for measurements and observations. Electronically signed by Harold Barban MD on 01/29/2019 at 8:23:59 AM.    Final       Assessment/Plan Acute left MCA CVA s/p tPA VDRF secondary to above - tracheostomy planned for 12/21 VAP - IV abx per CCM Agitation A. Fib with RVR Chronic BDZ use Oral thrush  Dysphagia after CVA Consult for PEG placement  - will discuss with Dr. Bobbye Morton for probable PEG tomorrow  - hold TF after MN tonight - stop heparin gtt after MN tonight - called patient's brother, [name], and notified of plan - nursing to call and get consent later today   Brigid Re, [facility]  Surgery 01/29/2019, 9:27 AM Please see Amion for pager number during day hours 7:00am-4:30pm

## 2019-01-29 NOTE — Progress Notes (Addendum)
  ANTICOAGULATION CONSULT NOTE - Follow-Up  Pharmacy Consult for Heparin Indication: atrial fibrillation and stroke  Allergies  Allergen Reactions  . Oxycodone Itching    Patient Measurements: Height: 5' 8.5" (174 cm) Weight: 186 lb 15.2 oz (84.8 kg) IBW/kg (Calculated) : 69.55  Ideal body weight: 69.6 kg (153 lb 5.3 oz) Adjusted ideal body weight: 75.6 kg (166 lb 12.4 oz)  Heparin Wt: 84.2 kg  Vital Signs: Temp: 99.7 F (37.6 C) (12/21 0355) Temp Source: Oral (12/21 0355) BP: 136/93 (12/21 0732) Pulse Rate: 93 (12/21 0732)  Labs: Recent Labs    01/27/19 0236 01/27/19 1011 01/28/19 0430 01/28/19 1009 01/28/19 2007 01/29/19 0245  HGB 11.9*  --  11.6*  --   --  11.5*  HCT 36.9*  --  36.4*  --   --  35.6*  PLT 357  --  436*  --   --  474*  HEPARINUNFRC 0.39 0.55 0.34 0.20* 0.34 0.28*  CREATININE  --  0.95 0.73  --   --  0.69    Estimated Creatinine Clearance: 93.3 mL/min (by C-G formula based on SCr of 0.69 mg/dL).  Assessment: 71 YOM off warfarin PTA for history of Afib who presented on 12/10 with an acute stroke. The patient required intubation so pharmacy consulted to dose Heparin for anticoagulation.     Hep lvl slightly below goal at 0.28  CBC stable  Goal of Therapy:  Heparin level 0.3 - 0.5 units/ml; no boluses Monitor platelets by anticoagulation protocol: Yes   Plan:  Increase heparin to 1800 units/hr - will resume post trach at 1500 Heparin off at 0000 for PEG in am. F/U restart after  Barth Kirks, PharmD, BCPS, BCCCP Clinical Pharmacist [phone]  Please check AMION for all [place] numbers  01/29/2019 8:01 AM

## 2019-01-30 ENCOUNTER — Encounter (HOSPITAL_COMMUNITY)
Admission: EM | Disposition: A | Discharge status: Skilled Nursing Facility | Payer: Self-pay | Source: Home / Self Care | Attending: Neurology

## 2019-01-30 DIAGNOSIS — F1920 Other psychoactive substance dependence, uncomplicated: Secondary | ICD-10-CM

## 2019-01-30 DIAGNOSIS — E43 Unspecified severe protein-calorie malnutrition: Secondary | ICD-10-CM | POA: Diagnosis present

## 2019-01-30 HISTORY — PX: ESOPHAGOGASTRODUODENOSCOPY: SHX5428

## 2019-01-30 HISTORY — PX: PEG PLACEMENT: SHX5437

## 2019-01-30 LAB — BASIC METABOLIC PANEL
Anion gap: 7 (ref 5–15)
BUN: 18 mg/dL (ref 8–23)
CO2: 28 mmol/L (ref 22–32)
Calcium: 8.2 mg/dL — ABNORMAL LOW (ref 8.9–10.3)
Chloride: 108 mmol/L (ref 98–111)
Creatinine, Ser: 0.59 mg/dL — ABNORMAL LOW (ref 0.61–1.24)
GFR calc Af Amer: >60 mL/min (ref >60)
GFR calc non Af Amer: >60 mL/min (ref >60)
Glucose, Bld: 113 mg/dL — ABNORMAL HIGH (ref 70–99)
Potassium: 3.8 mmol/L (ref 3.5–5.1)
Sodium: 143 mmol/L (ref 135–145)

## 2019-01-30 LAB — CBC
HCT: 35.5 % — ABNORMAL LOW (ref 39.0–52.0)
Hemoglobin: 11.5 g/dL — ABNORMAL LOW (ref 13.0–17.0)
MCH: 34.6 pg — ABNORMAL HIGH (ref 26.0–34.0)
MCHC: 32.4 g/dL (ref 30.0–36.0)
MCV: 106.9 fL — ABNORMAL HIGH (ref 80.0–100.0)
Platelets: 537 10*3/uL — ABNORMAL HIGH (ref 150–400)
RBC: 3.32 MIL/uL — ABNORMAL LOW (ref 4.22–5.81)
RDW: 15.1 % (ref 11.5–15.5)
WBC: 11.2 10*3/uL — ABNORMAL HIGH (ref 4.0–10.5)
nRBC: 0 % (ref 0.0–0.2)

## 2019-01-30 LAB — GLUCOSE, CAPILLARY
Glucose-Capillary: 100 mg/dL — ABNORMAL HIGH (ref 70–99)
Glucose-Capillary: 88 mg/dL (ref 70–99)
Glucose-Capillary: 146 mg/dL — ABNORMAL HIGH (ref 70–99)
Glucose-Capillary: 80 mg/dL (ref 70–99)
Glucose-Capillary: 92 mg/dL (ref 70–99)

## 2019-01-30 LAB — HEPARIN LEVEL (UNFRACTIONATED): Heparin Unfractionated: <0.1 IU/mL — ABNORMAL LOW (ref 0.30–0.70)

## 2019-01-30 SURGERY — EGD (ESOPHAGOGASTRODUODENOSCOPY)
Anesthesia: Moderate Sedation

## 2019-01-30 MED ORDER — RISPERIDONE 1 MG/ML PO SOLN: 1.0000 mg | Freq: Every day | ORAL | Status: DC

## 2019-01-30 MED ORDER — HEPARIN (PORCINE) 25000 UT/250ML-% IV SOLN
2100.0000 [IU]/h | INTRAVENOUS | Status: DC
  Administered 2019-01-30: 23:00:00 1800 [IU]/h via INTRAVENOUS
  Administered 2019-02-01 – 2019-02-03 (×5): 2100 [IU]/h via INTRAVENOUS
  Filled 2019-01-30 (×7): qty 250

## 2019-01-30 MED ORDER — FREE WATER
200.0000 mL | Freq: Three times a day (TID) | Status: DC
  Administered 2019-01-30 – 2019-02-05 (×17): 200 mL

## 2019-01-30 MED ORDER — MIDAZOLAM HCL 2 MG/2ML IJ SOLN
10.0000 mg | Freq: Once | INTRAMUSCULAR | Status: AC
  Administered 2019-01-30: 2 mg via INTRAVENOUS
  Filled 2019-01-30 (×2): qty 10

## 2019-01-30 MED ORDER — SODIUM CHLORIDE 0.9 % IV SOLN
INTRAVENOUS | Status: DC | PRN
  Administered 2019-01-30: 10:00:00 1000 mL via INTRAVENOUS

## 2019-01-30 MED ORDER — RISPERIDONE 1 MG/ML PO SOLN
0.5000 mg | Freq: Every day | ORAL | Status: AC
  Administered 2019-02-01: 0.5 mg via ORAL
  Filled 2019-01-30: qty 0.5

## 2019-01-30 MED ORDER — RISPERIDONE 1 MG/ML PO SOLN
0.5000 mg | Freq: Two times a day (BID) | ORAL | Status: AC
  Administered 2019-01-31 (×2): 0.5 mg via ORAL
  Filled 2019-01-30 (×2): qty 0.5

## 2019-01-30 MED ORDER — RISPERIDONE 1 MG/ML PO SOLN
1.0000 mg | Freq: Every day | ORAL | Status: AC
  Administered 2019-01-30: 23:00:00 1 mg
  Filled 2019-01-30: qty 1

## 2019-01-30 MED ORDER — FENTANYL CITRATE (PF) 100 MCG/2ML IJ SOLN
100.0000 ug | Freq: Once | INTRAMUSCULAR | Status: AC
  Administered 2019-01-30: 16:00:00 50 ug via INTRAVENOUS
  Filled 2019-01-30: qty 2

## 2019-01-30 MED ORDER — OSMOLITE 1.5 CAL PO LIQD
237.0000 mL | Freq: Four times a day (QID) | ORAL | Status: DC
  Administered 2019-01-30 – 2019-02-06 (×28): 237 mL
  Filled 2019-01-30 (×31): qty 237

## 2019-01-30 NOTE — Progress Notes (Signed)
NAME:  [name], MRN:  [id number], DOB:  [birth year], LOS: 16 ADMISSION DATE:  01/18/2019, CONSULTATION DATE:  01/18/19 REFERRING MD:  Leonel Ramsay, CHIEF COMPLAINT:  Unable to elicit   Brief History   57yM with hypertensive emergency in setting of acute L MCA stroke s/p tPA, AF/RVR.  Has been noncompliant with his warfarin for A. Fib. Code stroke activated, TPA administered. Intubated for respiratory failure. PCCM consulted for vent management  Significant Hospital Events   12/10 tPA administered at 1946 12/11 intubated  Consults:  PCCM  Procedures:  None  Significant Diagnostic Tests:  CTA h/n 12/10 > distal MCA branches that lose opacification and there could be a few small emboli in the left MCA territory.  CT head 12/11 > Interval demarcation of an acute cortically based infarct within the lateral left frontal lobe as described. No acute intracranial hemorrhage or significant mass effect.  MRI brain 12/12 >  1. Multifocal acute ischemia within the left MCA territory, including the left postcentral gyrus. No hemorrhage or mass effect. 2. 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 3. Chronic ischemic microangiopathy.  Micro Data:  Blood 12/10 > NGTD Urine 12/10 > insignificant growth  MRSA surveillance 12/11 > negative  SARS Coronavirus 2 12/11 > negative  Antimicrobials:  Cefepime 12/18>>  Interim history/subjective:  No overnight issues. Resting comfortably.   Objective   Blood pressure (!) 144/89, pulse (!) 107, temperature 99.3 F (37.4 C), temperature source Axillary, resp. rate 14, height 5' 8.5" (1.74 m), weight 85.4 kg, SpO2 98 %.    Vent Mode: PRVC FiO2 (%):  [30 %-40 %] 40 % Set Rate:  [12 bmp] 12 bmp Vt Set:  [540 mL] 540 mL PEEP:  [5 cmH20] 5 cmH20 Plateau Pressure:  [12 cmH20-15 cmH20] 12 cmH20   Intake/Output Summary (Last 24 hours) at 01/30/2019 0910 Last data filed at 01/30/2019 0805 Gross per 24 hour  Intake 1850.15 ml  Output  1150 ml  Net 700.15 ml   Filed Weights   01/28/19 0600 01/29/19 0443 01/30/19 0431  Weight: 84.2 kg 84.8 kg 85.4 kg    Examination: General: Chronically ill appearing elderly male on mechanical ventilation, in NAD HEENT: ETT, MM pink/moist, PERRL, white coating over tongue  Neuro: intubated, resting comfortably CV: s1s2 regular rate and rhythm, no murmur, rubs, or gallops,  PULM:  Clear to ascultation, slightly diminished to right base  GI: soft, bowel sounds active in all 4 quadrants, non-tender, non-distended, tolerating TF bolus Extremities: warm/dry, no edema  Skin: no rashes or lesions  Resolved Hospital Problem list   Hypertensive emergency   Assessment & Plan:  Acute stroke with distal branch occlusions of left MCA -Status post tPA Plan: Management per Neuro   Acute respiratory failure due to CVA Plan: S/p tracheostomy on 12/21. Has been failing SBT for apnea, likely secondary to sedating agents, tapering as below. Head of bed 30 degrees Follow intermittent CXR and ABG VAP bundle in place PAD protocol  Discontinue IVF, can add enteral free H20  Acute Encephalopathy Combination of chronic benzodiazepine use, icu delirium. Plan: Has been on high doses of sedating medications. Taper risperdal, was on klonopin which is also being tapered Will stop depakote - not a home med, was added for agitation. Can use precedex if he needs a continuous sedating agent.  VAP -Developed low grade fever overnight 12/15 with worsening right sided opacities on CXR sputum culture 12/16 with abundant gram positive cocci.  Plan:  Continue to follow formal culture results  Continue IV antibiotics, will finish 7 days of cefepime for suspected gram negative bacteria pna Frequent oral care   Atrial fibrillation with RVR -now rate contolled Plan: Continue PO Cardizem, lopressor,  and Amiodarone IV heparin Continuous telemetry   Oral thrush Plan: Nystatin suspension  Continue  frequent oral care   Best practice:  Diet:TF bolus Pain/Anxiety/Delirium protocol (if indicated): see sedation management as above VAP protocol (if indicated): In place DVT prophylaxis: SCDs GI prophylaxis: PPI  Glucose control: has not required coverage. D/c accu checks. Hypoglycemia protocol  Mobility: Bedrest Code Status: Full code Family Communication: I updated the patients brother 12/20. Plan is for tracheostomy and peg placement this week.  Disposition:  ICU  Labs and imaging for past 48 hours reviewed in EMR    Critical care:   The patient is critically ill with multiple organ systems failure and requires high complexity decision making for assessment and support, frequent evaluation and titration of therapies, application of advanced monitoring technologies and extensive interpretation of multiple databases.   Critical Care Time devoted to patient care services described in this note is 34 minutes. This time reflects time of care of this [place] . This critical care time does not reflect separately billable procedures or procedure time, teaching time or supervisory time of PA/NP/Med student/Med Resident etc but could involve care discussion time.  [facility]one [facility] 01/30/2019 9:10 AM  Pager: [phone] After hours pager: [phone]

## 2019-01-30 NOTE — Progress Notes (Deleted)
SLP Cancellation Note  Patient Details Name: [name] MRN: [id number] DOB: Apr 21, 1950   Cancelled treatment:        Pt now intubated. ST will sign off and please reorder when/if needed.    [name] 01/30/2019, 8:47 AM  Orbie Pyo Colvin Caroli.Ed Risk analyst [phone] Office [phone]

## 2019-01-30 NOTE — Progress Notes (Signed)
STROKE TEAM PROGRESS NOTE   INTERVAL HISTORY Patient remains on ventilatory support for his respiratory failure and sedated.  Despite having a tracheostomy yesterday there has been no improvement in either mental status, need for sedation or ventilatory support.  His prognosis remains poor.  PEG tube is scheduled for later today by trauma team he remains aphasic and not following any commands consistently.  Currently on Abx and heparin IV.  Marland Kitchen No family at the bedside  OBJECTIVE Vitals:   01/30/19 1000 01/30/19 1100 01/30/19 1108 01/30/19 1200  BP: (!) 164/122 (!) 136/94  120/80  Pulse: 79 78 95 76  Resp: (!) 22 20 (!) 23 (!) 31  Temp:  (!) 97.3 F (36.3 C)    TempSrc:  Axillary    SpO2: 97% 97% 99% 97%  Weight:      Height:        CBC:  Recent Labs  Lab 01/29/19 0245 01/30/19 0237  WBC 11.5* 11.2*  HGB 11.5* 11.5*  HCT 35.6* 35.5*  MCV 106.3* 106.9*  PLT 474* 537*    Basic Metabolic Panel:  Recent Labs  Lab 01/24/19 2045 01/29/19 0245 01/30/19 0237  NA 144 147* 143  K 3.5 3.6 3.8  CL 111 110 108  CO2 24 29 28   GLUCOSE 132* 104* 113*  BUN 30* 21 18  CREATININE 0.77 0.69 0.59*  CALCIUM 8.6* 8.1* 8.2*  MG 1.9  --   --     Lipid Panel:     Component Value Date/Time   CHOL 238 (H) 01/19/2019 0437   TRIG 64 01/19/2019 0437   HDL 43 01/19/2019 0437   CHOLHDL 5.5 01/19/2019 0437   VLDL 13 01/19/2019 0437   LDLCALC 182 (H) 01/19/2019 0437   HgbA1c:  Lab Results  Component Value Date   HGBA1C 5.1 01/19/2019   Urine Drug Screen:     Component Value Date/Time   LABOPIA NONE DETECTED 07/13/2016 1428   COCAINSCRNUR NONE DETECTED 07/13/2016 1428   LABBENZ POSITIVE (A) 07/13/2016 1428   AMPHETMU NONE DETECTED 07/13/2016 1428   THCU POSITIVE (A) 07/13/2016 1428   LABBARB NONE DETECTED 07/13/2016 1428    Alcohol Level     Component Value Date/Time   ETH <5 02/09/2015 0010    IMAGING  CT HEAD WO CONTRAST 01/24/2019 IMPRESSION: Involving acute/subacute  infarcts in the left frontal parietal lobe and left occipital lobe. Small amount of hemorrhage in the left posterior frontal infarct unchanged from prior CT and MRI. Electronically Signed   By: Franchot Gallo M.D.   On: 01/24/2019 16:18   MRI Wo Contrast  01/20/2019 IMPRESSION: 1. Multifocal acute ischemia within the left MCA territory, including the left postcentral gyrus. No hemorrhage or mass effect. 2. 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 3. Chronic ischemic microangiopathy.  CT Head Wo Contrast  01/19/2019 IMPRESSION: Interval demarcation of an acute cortically based infarct within the lateral left frontal lobe as described. No acute intracranial hemorrhage or significant mass effect.  CT Code Stroke CTA Head W/WO contrast CT Code Stroke CTA Neck W/WO contrast 01/18/2019 IMPRESSION:   Atherosclerotic disease at both carotid bifurcations but no stenosis.   Prominent soft plaque at the left ICA bulb.   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   Dominant right vertebral artery widely patent.   No intracranial large or medium vessel occlusion or correctable proximal stenosis, with specific attention to the left MCA branches.   I do think  there are a few distal MCA branches that lose opacification and there could be a few small emboli in the left MCA territory. Vessels marked with arrows.   Distal vessel atherosclerotic irregularity, particularly evident in the PCA branches.   Aortic Atherosclerosis (ICD10-I70.0)   Emphysema (ICD10-J43.9).   CT HEAD CODE STROKE WO CONTRAST 01/18/2019  IMPRESSION: 1. No acute finding by CT. Chronic small-vessel ischemic changes of the white matter. Old right cerebellar and right occipital infarctions.  2. ASPECTS is 10   Transthoracic Echocardiogram  01/19/2019 IMPRESSIONS  1. Left ventricular ejection fraction, by visual estimation, is 55 to 60%. The left  ventricle has normal function. There is moderately increased left ventricular hypertrophy.  2. The left ventricle demonstrates regional wall motion abnormalities.  3. Global right ventricle has normal systolic function.The right ventricular size is normal. No increase in right ventricular wall thickness.  4. Left atrial size was normal.  5. Right atrial size was mildly dilated.  6. The mitral valve is normal in structure. Mild to moderate mitral valve regurgitation.  7. The tricuspid valve is normal in structure. Tricuspid valve regurgitation is trivial.  8. The aortic valve is normal in structure. Aortic valve regurgitation is not visualized.  9. The pulmonic valve was normal in structure. Pulmonic valve regurgitation is not visualized. 10. The atrial septum is grossly normal.  ECG atrial fibrillation - ventricular response 133 BPM (See cardiology reading for complete details)  PHYSICAL EXAM  Blood pressure 120/80, pulse 76, temperature (!) 97.3 F (36.3 C), temperature source Axillary, resp. rate (!) 31, height 5' 8.5" (1.74 m), weight 85.4 kg, SpO2 97 %.  General -frail elderly Caucasian male, intubated on sedation  Ophthalmologic - fundi not visualized due to noncooperation.  Cardiovascular - irregularly irregular heart rate and rhythm.  Neuro - intubated  on sedation, eyes closed, not following commands.  Globally aphasic.  Barely open eyes with pain stimulation. With forced eye opening, pt resisted eye opening bilaterally, eyes in mid position, not blinking to visual threat bilaterally, not tracking, pupil bilaterally 2 mm, sluggish to light. Doll's eyes sluggish, corneal reflex weakly present L>R, gag and cough present.  Breathing over the vent.  Facial symmetry not able to test due to ET tube. On pain stimulation, no movement of right extremity but has some tone and movement in the left upper extremity, slight toe and leg movement at BLEs. Sensation, coordination and gait not  tested.   ASSESSMENT/PLAN [name] is a 68 y.o. male with history of tobacco use, previous strokes, COPD, AAA, ASPVD, htn, hld, pulmonary nodule, depression, atrial fibrillation on coumadin but recently missed doses presenting with transient aphasia and leaning to the right. The patient received IV t-PA Thursday 01/18/19 @ 2000.  Stroke - left MCA infarct s/p t-PA - embolic pattern, likely due to atrial fibrillation with subtherapeutic INR  Resultant global aphasia, left gaze, right hemiparesis, right neglect and agitation  Code Stroke CT Head - No acute finding by CT. Chronic small-vessel ischemic changes of the white matter. Old right cerebellar and right occipital infarctions. ASPECTS is 10   CT head - Interval demarcation of an acute cortically based infarct within the lateral left frontal lobe as described. No acute intracranial hemorrhage or significant mass effect.  MRI head -  Multifocal acute ischemia within the left MCA territory, including the left postcentral gyrus. No hemorrhage or mass effect. 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 Chronic ischemic microangiopathy.  CTA H&N - Prominent soft plaque at the  left ICA bulb. 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   CT 12/16 stable, continued evolution of left MCA infarcts  2D Echo - EF 55 to 60%. No cardiac source of emboli identified.   [employer] Virus 2 - negative  LDL - 182  HgbA1c - 5.1  VTE prophylaxis -Heparin subq  warfarin daily prior to admission, now on heparin IV  Therapy recommendations:  Signed off, reorder once off vent  Disposition:  Pending  Afib with RVR  On amiodarone and Cardizem drip  Still has RVR due to agitation  On lopressor 25 bid (on 100 bid at home) increase as needed    On warfarin PTA, INR 1.1 on admission  On aspirin for now  On heparin IV  AMS with agitation, chronic benzo use Respiratory  failure with hypoxia  Intubated   Weaned off precedex and fentanyl and versed  On resperidone 1mg  bid, klonopin 0.5 bid, oxycodone 5 mb q6h  CXR Increased right pleural effusion with associated right basilar atelectasis or infiltrate  Tachypnea on SBT received bolus sedation  CCM on board  Extubate as able  B12 deficiency  Vitamin B12 = 161  B12 supplement  History of stroke  11/2014, bilateral blurry vision after left upper extremity bypass surgery.  Found to have left central vision loss.  MRI showed bilateral embolic infarcts.  INR 1.45.  Carotid Doppler negative.  TTE unremarkable.  LDL 82, A1c 5.4.  Continued on discharge with Coumadin and Lipitor 40  hypotension Hx of hypertension  Home BP meds: Diltiazem, Lasix and metoprolol  Current BP meds clonidine 0.3 q 6h , metoprolol 25 bid  BP stable  Off neo . Avoid low BP  Hyperlipidemia  Home Lipid lowering medication: Lipitor 40 mg daily  LDL 182, goal < 70  Current lipid lowering medication: Lipitor 80 mg daily   Continue statin at discharge  Dysphagia  N.p.o.  Speech to follow once extubated  P.o. meds through tube  On bolus feed  VAP Fever and leukocytosis  T-max 101.7->100.2->100.4->101.5->99.6  leucocytosis - 11.1->11.7->11.0->14.0->13.4->12.6->10.0->13.4->7.8->9.0  CXR with increased R pleural effusion w/ R basilar atx/infiltrate  UA neg 01/18/19  On cefepime (started 01/26/19 x 7 days) for S.pneumoniae and E.cloacae in sputum  CCM on board  Tobacco abuse  Current smoker  Smoking cessation counseling will be provided  Pt is willing to quit  Other Stroke Risk Factors  Advanced age  ETOH use, advised to drink no more than 1 alcoholic beverage per day.  Other Active Problems  Hypokalemia 3.3->3.4->3.3->3.1->4.0->4.3->4.4->4.4->4.0->3.4->3.7  Oral thrush on nystatin  Mild Anemia - Hb - 11.9   Hospital day # 12 Continue IV heparin till patient has a  PEG tube   and  can be switched to warfarin.  Patient prognosis remains poor given his significant aphasia and hemiparesis is unlikely to survive without prolong feeding tube and nursing care.  I spoke to the patient's brother over the phone again today and answered his questions about his prognosis and he states that his brother was a Nurse, adult and would have wanted to live at all cost and is agreeable to tracheostomy and PEG tube and nursing home placement.  I explained to him that inability to wean off ventilatory support usually indicates need for long-term ventilatory care and there are limited options in nursing homes and facilities that can provide that level of care.  He voiced understanding and wants to continue present treatment discussed with  Dr. Shearon Stalls critical care medicine.  No family available at the bedside for discussion. This patient is critically ill and at significant risk of neurological worsening, death and care requires constant monitoring of vital signs, hemodynamics,respiratory and cardiac monitoring, extensive review of multiple databases, frequent neurological assessment, discussion with family, other specialists and medical decision making of high complexity. I spent 35 minutes of neurocritical care time  in the care of  this patient.  Antony Contras, MD To contact Stroke Continuity provider, please refer to [email or web address] After hours, contact General Neurology

## 2019-01-30 NOTE — Progress Notes (Signed)
Orthopedic Tech Progress Note Patient Details:  [name] 27-Jun-1950 [id number]  Ortho Devices Type of Ortho Device: Abdominal binder   Post Interventions Patient Tolerated: Well Instructions Provided: Care of device   [name] 01/30/2019, 5:40 PM

## 2019-01-30 NOTE — Progress Notes (Signed)
Trauma Critical Care Follow Up Note  Subjective:    Overnight Issues: NAEON  Objective:  Vital signs for last 24 hours: Temp:  [97.3 F (36.3 C)-99.3 F (37.4 C)] 97.3 F (36.3 C) (12/22 1100) Pulse Rate:  [67-114] 95 (12/22 1108) Resp:  [0-23] 23 (12/22 1108) BP: (99-177)/(69-122) 136/94 (12/22 1100) SpO2:  [91 %-100 %] 99 % (12/22 1108) FiO2 (%):  [40 %] 40 % (12/22 1108) Weight:  [85.4 kg] 85.4 kg (12/22 0431)  Hemodynamic parameters for last 24 hours:    Intake/Output from previous day: 12/21 0701 - 12/22 0700 In: 1896.8 [I.V.:1035; NG/GT:562; IV Piggyback:299.7] Out: 1150 [Urine:1150]  Intake/Output this shift: Total I/O In: 538.6 [I.V.:1.6; NG/GT:437; IV Piggyback:100] Out: -   Vent settings for last 24 hours: Vent Mode: PRVC FiO2 (%):  [40 %] 40 % Set Rate:  [12 bmp] 12 bmp Vt Set:  [540 mL] 540 mL PEEP:  [5 cmH20] 5 cmH20 Plateau Pressure:  [12 cmH20-15 cmH20] 15 cmH20  Physical Exam:  Gen: comfortable, no distress Neuro: not interactive at all HEENT: trached CV: RRR Pulm: unlabored breathing, mechanically ventilated Abd: soft, nontender, no scars GU: clear, yellow urine Extr: wwp, no edema   Results for orders placed or performed during the hospital encounter of 01/18/19 (from the past 24 hour(s))  Glucose, capillary     Status: None   Collection Time: 01/29/19  3:03 PM  Result Value Ref Range   Glucose-Capillary 86 70 - 99 mg/dL  Heparin level (unfractionated)     Status: Abnormal   Collection Time: 01/29/19  4:36 PM  Result Value Ref Range   Heparin Unfractionated <0.10 (L) 0.30 - 0.70 IU/mL  Glucose, capillary     Status: None   Collection Time: 01/29/19  7:11 PM  Result Value Ref Range   Glucose-Capillary 96 70 - 99 mg/dL  Glucose, capillary     Status: None   Collection Time: 01/29/19 11:04 PM  Result Value Ref Range   Glucose-Capillary 98 70 - 99 mg/dL  CBC     Status: Abnormal   Collection Time: 01/30/19  2:37 AM  Result Value  Ref Range   WBC 11.2 (H) 4.0 - 10.5 K/uL   RBC 3.32 (L) 4.22 - 5.81 MIL/uL   Hemoglobin 11.5 (L) 13.0 - 17.0 g/dL   HCT 35.5 (L) 39.0 - 52.0 %   MCV 106.9 (H) 80.0 - 100.0 fL   MCH 34.6 (H) 26.0 - 34.0 pg   MCHC 32.4 30.0 - 36.0 g/dL   RDW 15.1 11.5 - 15.5 %   Platelets 537 (H) 150 - 400 K/uL   nRBC 0.0 0.0 - 0.2 %  Heparin level (unfractionated)     Status: Abnormal   Collection Time: 01/30/19  2:37 AM  Result Value Ref Range   Heparin Unfractionated <0.10 (L) 0.30 - 0.70 IU/mL  Basic metabolic panel     Status: Abnormal   Collection Time: 01/30/19  2:37 AM  Result Value Ref Range   Sodium 143 135 - 145 mmol/L   Potassium 3.8 3.5 - 5.1 mmol/L   Chloride 108 98 - 111 mmol/L   CO2 28 22 - 32 mmol/L   Glucose, Bld 113 (H) 70 - 99 mg/dL   BUN 18 8 - 23 mg/dL   Creatinine, Ser 0.59 (L) 0.61 - 1.24 mg/dL   Calcium 8.2 (L) 8.9 - 10.3 mg/dL   GFR calc non Af Amer >60 >60 mL/min   GFR calc Af Amer >60 >60 mL/min  Anion gap 7 5 - 15  Glucose, capillary     Status: Abnormal   Collection Time: 01/30/19  3:06 AM  Result Value Ref Range   Glucose-Capillary 100 (H) 70 - 99 mg/dL  Glucose, capillary     Status: None   Collection Time: 01/30/19  7:11 AM  Result Value Ref Range   Glucose-Capillary 88 70 - 99 mg/dL  Glucose, capillary     Status: Abnormal   Collection Time: 01/30/19 11:08 AM  Result Value Ref Range   Glucose-Capillary 146 (H) 70 - 99 mg/dL    Assessment & Plan: Present on Admission: . Stroke (cerebrum) ([place])    LOS: 12 days   Additional comments:I reviewed the patient's new clinical lab test results.    Plan for PEG at bedside this PM. Fent/versed on hold for procedure.   Jesusita Oka, MD Trauma & General Surgery Please use [email or web address] to contact on call provider  01/30/2019  *Care during the described time interval was provided by me. I have reviewed this patient's available data, including medical history, events of note, physical examination and test  results as part of my evaluation.

## 2019-01-30 NOTE — Progress Notes (Signed)
Pt's brother [name] is requesting pt be transferred to [facility] "to see if there is some miracle out there for him." [facility]'s transfer line is [phone]. Dr. Leonie Man paged. Awaiting call back.

## 2019-01-30 NOTE — Progress Notes (Signed)
  ANTICOAGULATION CONSULT NOTE - Follow-Up  Pharmacy Consult for Heparin Indication: atrial fibrillation and stroke  Allergies  Allergen Reactions  . Oxycodone Itching    Patient Measurements: Height: 5' 8.5" (174 cm) Weight: 188 lb 4.4 oz (85.4 kg) IBW/kg (Calculated) : 69.55  Ideal body weight: 69.6 kg (153 lb 5.3 oz) Adjusted ideal body weight: 75.9 kg (167 lb 4.9 oz)  Heparin Wt: 84.2 kg  Vital Signs: Temp: 99.3 F (37.4 C) (12/22 1927) Temp Source: Axillary (12/22 1927) BP: 124/89 (12/22 1907) Pulse Rate: 108 (12/22 1907)  Labs: Recent Labs    01/28/19 0430 01/29/19 0245 01/29/19 1636 01/30/19 0237  HGB 11.6* 11.5*  --  11.5*  HCT 36.4* 35.6*  --  35.5*  PLT 436* 474*  --  537*  HEPARINUNFRC 0.34 0.28* <0.10* <0.10*  CREATININE 0.73 0.69  --  0.59*    Estimated Creatinine Clearance: 93.6 mL/min (A) (by C-G formula based on SCr of 0.59 mg/dL (L)).  Assessment: 12 YOM off warfarin PTA for history of Afib who presented on 12/10 with an acute stroke. The patient required intubation so pharmacy consulted to dose Heparin for anticoagulation.     CBC stable  Heparin on hold today for PEG tube placement - now in place.  Spoke to Dr. Bobbye Morton - okay to resume heparin now.  Goal of Therapy:  Heparin level 0.3 - 0.5 units/ml; no boluses Monitor platelets by anticoagulation protocol: Yes   Plan:  Restart IV heparin at 1800 units/hr. Recheck heparin level in 6 hrs. Daily heparin level and CBC. F/u plans to start Coumadin soon via PEG?  Marguerite Olea, [facility] Clinical Pharmacist Phone [phone]  01/30/2019 9:10 PM

## 2019-01-30 NOTE — Evaluation (Signed)
Physical Therapy Evaluation and Discharge Patient Details Name: [name] MRN: [id number] DOB: 17-Jun-1950 Today's Date: 01/30/2019   History of Present Illness  68yM admitted 01/20/19 with hypertensive emergency in setting of acute L MCA stroke s/p tPA, AF/RVR.  Has been noncompliant with his warfarin for A. Fib. Intubated for respiratory failure. Trach 01/29/19 with PEG planned for 01/30/19. No sedation at time of PT eval.   Clinical Impression   PT Consult received s/p tracheostomy. RN reports all sedation has been off and pt is still not waking up/responding. Completed bed level evaluation incorporating multi-sensory system stimuli to attempt to elicit any response. Patient's vital signs were stable and unchanged throughout session. Patient demonstrated active movement of LLE x 1 with hip and knee flexion and then relaxation. Could not reproduce with even painful stimuli. No eye opening elicited. Patient currently is not able to participate in skilled PT. If patient begins to show signs of increasing alertness/arousal, please re-order PT at that time.     Follow Up Recommendations No PT follow up    Equipment Recommendations       Recommendations for Other Services       Precautions / Restrictions Precautions Precautions: Fall;Other (comment) Precaution Comments: multiple lines, vent, trach in ICU Restrictions Weight Bearing Restrictions: No      Mobility  Bed Mobility               General bed mobility comments: total assist  Transfers                    Ambulation/Gait                Stairs            Wheelchair Mobility    Modified Rankin (Stroke Patients Only) Modified Rankin (Stroke Patients Only) Pre-Morbid Rankin Score: No symptoms Modified Rankin: Severe disability     Balance                                             Pertinent Vitals/Pain Pain Assessment: Faces Faces Pain Scale: No hurt     Home Living Family/patient expects to be discharged to:: Unsure                 Additional Comments: per chart, brother wants aggressive care    Prior Function           Comments: unknown, assume indpendent     Hand Dominance   Dominant Hand: Right    Extremity/Trunk Assessment   Upper Extremity Assessment Upper Extremity Assessment: (no AROM noted bil; PROM WFL; bil incr edema & re-positioned )    Lower Extremity Assessment Lower Extremity Assessment: (PROM WFL; no edema noted; spontaneously flexed hip/knee LL)    Cervical / Trunk Assessment Cervical / Trunk Assessment: Other exceptions Cervical / Trunk Exceptions: head sidebent to his right; gentle PROM to midline with pillow/support to maintain  Communication   Communication: Tracheostomy;Other (comment)(per Neuro notes, globally aphasic)  Cognition Arousal/Alertness: Lethargic(RN reports all sedation has been d/c'd) Behavior During Therapy: (unarousable) Overall Cognitive Status: Impaired/Different from baseline                                 General Comments: Patient did not respond to painful stimuli x 4 extremities, loud noise to  either ear,       General Comments General comments (skin integrity, edema, etc.): Patient with no change in VS with all attempts to elicit a response.     Exercises     Assessment/Plan    PT Assessment Patent does not need any further PT services  PT Problem List         PT Treatment Interventions      PT Goals (Current goals can be found in the Care Plan section)  Acute Rehab PT Goals PT Goal Formulation: All assessment and education complete, DC therapy    Frequency     Barriers to discharge        Co-evaluation               AM-PAC PT "6 Clicks" Mobility  Outcome Measure Help needed turning from your back to your side while in a flat bed without using bedrails?: Total Help needed moving from lying on your back to sitting on the  side of a flat bed without using bedrails?: Total Help needed moving to and from a bed to a chair (including a wheelchair)?: Total Help needed standing up from a chair using your arms (e.g., wheelchair or bedside chair)?: Total Help needed to walk in hospital room?: Total Help needed climbing 3-5 steps with a railing? : Total 6 Click Score: 6    End of Session Equipment Utilized During Treatment: Oxygen(on vent--PRVC) Activity Tolerance: Patient tolerated treatment well Patient left: in bed;with bed alarm set;with SCD's reapplied(semi-chair position) Nurse Communication: Other (comment)(no skilled PT needs currently) PT Visit Diagnosis: Other symptoms and signs involving the nervous system [id number])    Time: [id number] PT Time Calculation (min) (ACUTE ONLY): 18 min   Charges:   PT Evaluation $PT Eval Low Complexity: 1 Low           Arby Barrette, PT Pager [phone]   Rexanne Mano 01/30/2019, 1:28 PM

## 2019-01-30 NOTE — Progress Notes (Signed)
Nutrition Follow-up  DOCUMENTATION CODES:   Severe malnutrition in context of chronic illness  INTERVENTION:   Continue tube feeding of Osmolite 1.5 bolus of 260m (1 carton) 4 times per day with Prostat 328mTID.   NUTRITION DIAGNOSIS:   Severe Malnutrition related to chronic illness(COPD) as evidenced by severe fat depletion, severe muscle depletion.  Continued  GOAL:   Patient will meet greater than or equal to 90% of their needs  Met with TF  MONITOR:   Vent status, TF tolerance, Labs, I & O's, Skin  ASSESSMENT:   [name] male admitted with hypertensive emergency in setting of acute L MCA stroke, required intubation for airway protection, pt choking on own secretions. PMH includes COPD, AF, CVA, PAD.  12/21 S/P tracheostomy and Cortrak placement, tip in the stomach. Plans for PEG placement today.   Patient is receiving gravity bolus feedings: Osmolite 1.5 237 ml QID with Pro-stat 30 ml TID providing 1720 kcal, 105 gm protein, 724 ml free water daily. Free water flushes 200 ml every 8 hours. Patient tolerating well, but it is taking a long time for gravity bolus to infuse per RN.  Pt is currently requiring vent support. MV: 12.5 Temp (24hrs), Avg:98.4 F (36.9 C), Min:97.3 F (36.3 C), Max:99.3 F (37.4 C)  Medications reviewed and include novolog, vitamin b-12.  Labs reviewed. CBGs [phone] wt 85.4 kg. Wt is trending up. I/O +14.4 L since admit.    Nutrition Focused Physical Exam:  Subcutaneous Fat:  Orbital Region: severe depletion Upper Arm Region: unable to assess Thoracic and Lumbar Region: unable to assess Buccal Region: severe depletion  Muscle:  Temple Region: severe depletion Clavicle Bone Region: severe depletion Clavicle and Acromion Bone Region: moderate depletion Scapular Bone Region: unable to assess Dorsal Hand: mild depletion Patellar Region: mild depletion Anterior Thigh Region: mild depletion Posterior Calf Region: mild  depletion  Edema: mild   Diet Order:   Diet Order            Diet NPO time specified  Diet effective now              EDUCATION NEEDS:   Not appropriate for education at this time  Skin:  Skin Assessment: Reviewed RN Assessment  Last BM:  12/21 type 6  Height:   Ht Readings from Last 1 Encounters:  01/28/19 5' 8.5" (1.74 m)    Weight:   Wt Readings from Last 1 Encounters:  01/30/19 85.4 kg    Ideal Body Weight:  70 kg  BMI:  Body mass index is 28.21 kg/m.  Estimated Nutritional Needs:   Kcal:  1950  Protein:  100-120  Fluid:  >2L    KiMolli BarrowsRD, LDN, CNSC Pager [phone] Hours Pager [phone]

## 2019-01-31 DIAGNOSIS — Z930 Tracheostomy status: Secondary | ICD-10-CM

## 2019-01-31 LAB — GLUCOSE, CAPILLARY
Glucose-Capillary: 114 mg/dL — ABNORMAL HIGH (ref 70–99)
Glucose-Capillary: 117 mg/dL — ABNORMAL HIGH (ref 70–99)
Glucose-Capillary: 128 mg/dL — ABNORMAL HIGH (ref 70–99)
Glucose-Capillary: 132 mg/dL — ABNORMAL HIGH (ref 70–99)
Glucose-Capillary: 151 mg/dL — ABNORMAL HIGH (ref 70–99)
Glucose-Capillary: 81 mg/dL (ref 70–99)
Glucose-Capillary: 95 mg/dL (ref 70–99)

## 2019-01-31 LAB — BASIC METABOLIC PANEL
Anion gap: 9 (ref 5–15)
BUN: 21 mg/dL (ref 8–23)
CO2: 28 mmol/L (ref 22–32)
Calcium: 8.3 mg/dL — ABNORMAL LOW (ref 8.9–10.3)
Chloride: 106 mmol/L (ref 98–111)
Creatinine, Ser: 0.84 mg/dL (ref 0.61–1.24)
GFR calc Af Amer: >60 mL/min (ref >60)
GFR calc non Af Amer: >60 mL/min (ref >60)
Glucose, Bld: 117 mg/dL — ABNORMAL HIGH (ref 70–99)
Potassium: 3.9 mmol/L (ref 3.5–5.1)
Sodium: 143 mmol/L (ref 135–145)

## 2019-01-31 LAB — CBC
HCT: 38.5 % — ABNORMAL LOW (ref 39.0–52.0)
Hemoglobin: 12.7 g/dL — ABNORMAL LOW (ref 13.0–17.0)
MCH: 34.7 pg — ABNORMAL HIGH (ref 26.0–34.0)
MCHC: 33 g/dL (ref 30.0–36.0)
MCV: 105.2 fL — ABNORMAL HIGH (ref 80.0–100.0)
Platelets: 543 10*3/uL — ABNORMAL HIGH (ref 150–400)
RBC: 3.66 MIL/uL — ABNORMAL LOW (ref 4.22–5.81)
RDW: 15 % (ref 11.5–15.5)
WBC: 15 10*3/uL — ABNORMAL HIGH (ref 4.0–10.5)
nRBC: 0 % (ref 0.0–0.2)

## 2019-01-31 LAB — HEPARIN LEVEL (UNFRACTIONATED)
Heparin Unfractionated: 0.15 IU/mL — ABNORMAL LOW (ref 0.30–0.70)
Heparin Unfractionated: <0.1 IU/mL — ABNORMAL LOW (ref 0.30–0.70)
Heparin Unfractionated: 0.32 IU/mL (ref 0.30–0.70)

## 2019-01-31 NOTE — Progress Notes (Signed)
STROKE TEAM PROGRESS NOTE   INTERVAL HISTORY Patient remains on ventilatory support for his respiratory failure and sedated.  Despite having a tracheostomy 2 days ago there has been no improvement in either mental status, need for sedation or ventilatory support.  His prognosis remains poor.  PEG tube done y`day by trauma team he remains aphasic and not following any commands consistently.  Currently on Abx and heparin IV.  Marland Kitchen No family at the bedside . Brother apparently called and wanted transfer to [place] to "" see if they could offer him something else and latest that we cannot "" OBJECTIVE Vitals:   01/31/19 1000 01/31/19 1100 01/31/19 1130 01/31/19 1200  BP: (!) 147/95 (!) 146/94  120/81  Pulse: 84 (!) 120  88  Resp: (!) 24 (!) 25  17  Temp:   99.7 F (37.6 C)   TempSrc:   Oral   SpO2: 96% 94%  95%  Weight:      Height:        CBC:  Recent Labs  Lab 01/30/19 0237 01/31/19 0345  WBC 11.2* 15.0*  HGB 11.5* 12.7*  HCT 35.5* 38.5*  MCV 106.9* 105.2*  PLT 537* 543*    Basic Metabolic Panel:  Recent Labs  Lab 01/24/19 2045 01/30/19 0237 01/31/19 0345  NA 144 143 143  K 3.5 3.8 3.9  CL 111 108 106  CO2 24 28 28   GLUCOSE 132* 113* 117*  BUN 30* 18 21  CREATININE 0.77 0.59* 0.84  CALCIUM 8.6* 8.2* 8.3*  MG 1.9  --   --     Lipid Panel:     Component Value Date/Time   CHOL 238 (H) 01/19/2019 0437   TRIG 64 01/19/2019 0437   HDL 43 01/19/2019 0437   CHOLHDL 5.5 01/19/2019 0437   VLDL 13 01/19/2019 0437   LDLCALC 182 (H) 01/19/2019 0437   HgbA1c:  Lab Results  Component Value Date   HGBA1C 5.1 01/19/2019   Urine Drug Screen:     Component Value Date/Time   LABOPIA NONE DETECTED 07/13/2016 1428   COCAINSCRNUR NONE DETECTED 07/13/2016 1428   LABBENZ POSITIVE (A) 07/13/2016 1428   AMPHETMU NONE DETECTED 07/13/2016 1428   THCU POSITIVE (A) 07/13/2016 1428   LABBARB NONE DETECTED 07/13/2016 1428    Alcohol Level     Component Value Date/Time   ETH <5  02/09/2015 0010    IMAGING  CT HEAD WO CONTRAST 01/24/2019 IMPRESSION: Involving acute/subacute infarcts in the left frontal parietal lobe and left occipital lobe. Small amount of hemorrhage in the left posterior frontal infarct unchanged from prior CT and MRI. Electronically Signed   By: Franchot Gallo M.D.   On: 01/24/2019 16:18   MRI Wo Contrast  01/20/2019 IMPRESSION: 1. Multifocal acute ischemia within the left MCA territory, including the left postcentral gyrus. No hemorrhage or mass effect. 2. 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 3. Chronic ischemic microangiopathy.  CT Head Wo Contrast  01/19/2019 IMPRESSION: Interval demarcation of an acute cortically based infarct within the lateral left frontal lobe as described. No acute intracranial hemorrhage or significant mass effect.  CT Code Stroke CTA Head W/WO contrast CT Code Stroke CTA Neck W/WO contrast 01/18/2019 IMPRESSION:   Atherosclerotic disease at both carotid bifurcations but no stenosis.   Prominent soft plaque at the left ICA bulb.   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   Dominant right vertebral artery widely patent.   No intracranial  large or medium vessel occlusion or correctable proximal stenosis, with specific attention to the left MCA branches.   I do think there are a few distal MCA branches that lose opacification and there could be a few small emboli in the left MCA territory. Vessels marked with arrows.   Distal vessel atherosclerotic irregularity, particularly evident in the PCA branches.   Aortic Atherosclerosis (ICD10-I70.0)   Emphysema (ICD10-J43.9).   CT HEAD CODE STROKE WO CONTRAST 01/18/2019  IMPRESSION: 1. No acute finding by CT. Chronic small-vessel ischemic changes of the white matter. Old right cerebellar and right occipital infarctions.  2. ASPECTS is 10   Transthoracic Echocardiogram   01/19/2019 IMPRESSIONS  1. Left ventricular ejection fraction, by visual estimation, is 55 to 60%. The left ventricle has normal function. There is moderately increased left ventricular hypertrophy.  2. The left ventricle demonstrates regional wall motion abnormalities.  3. Global right ventricle has normal systolic function.The right ventricular size is normal. No increase in right ventricular wall thickness.  4. Left atrial size was normal.  5. Right atrial size was mildly dilated.  6. The mitral valve is normal in structure. Mild to moderate mitral valve regurgitation.  7. The tricuspid valve is normal in structure. Tricuspid valve regurgitation is trivial.  8. The aortic valve is normal in structure. Aortic valve regurgitation is not visualized.  9. The pulmonic valve was normal in structure. Pulmonic valve regurgitation is not visualized. 10. The atrial septum is grossly normal.  ECG atrial fibrillation - ventricular response 133 BPM (See cardiology reading for complete details)  PHYSICAL EXAM  Blood pressure 120/81, pulse 88, temperature 99.7 F (37.6 C), temperature source Oral, resp. rate 17, height 5' 8.5" (1.74 m), weight 86 kg, SpO2 95 %.  General -frail elderly Caucasian male, intubated on sedation  Ophthalmologic - fundi not visualized due to noncooperation.  Cardiovascular - irregularly irregular heart rate and rhythm.  Neuro - intubated  on sedation, eyes closed, not following commands.  Globally aphasic.  Barely open eyes with pain stimulation. With forced eye opening, pt resisted eye opening bilaterally, eyes in mid position, not blinking to visual threat bilaterally, not tracking, pupil bilaterally 2 mm, sluggish to light. Doll's eyes sluggish, corneal reflex weakly present L>R, gag and cough present.  Breathing over the vent.  Facial symmetry not able to test due to ET tube. On pain stimulation, no movement of right extremity but has some tone and movement in the left  upper extremity, slight toe and leg movement at BLEs. Sensation, coordination and gait not tested.   ASSESSMENT/PLAN [name] is a 68 y.o. male with history of tobacco use, previous strokes, COPD, AAA, ASPVD, htn, hld, pulmonary nodule, depression, atrial fibrillation on coumadin but recently missed doses presenting with transient aphasia and leaning to the right. The patient received IV t-PA Thursday 01/18/19 @ 2000.  Stroke - left MCA infarct s/p t-PA - embolic pattern, likely due to atrial fibrillation with subtherapeutic INR  Resultant global aphasia, left gaze, right hemiparesis, right neglect and agitation  Code Stroke CT Head - No acute finding by CT. Chronic small-vessel ischemic changes of the white matter. Old right cerebellar and right occipital infarctions. ASPECTS is 10   CT head - Interval demarcation of an acute cortically based infarct within the lateral left frontal lobe as described. No acute intracranial hemorrhage or significant mass effect.  MRI head -  Multifocal acute ischemia within the left MCA territory, including the left postcentral gyrus. No hemorrhage or  mass effect. 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 Chronic ischemic microangiopathy.  CTA H&N - Prominent soft plaque at the left ICA bulb. 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   CT 12/16 stable, continued evolution of left MCA infarcts  2D Echo - EF 55 to 60%. No cardiac source of emboli identified.   [employer] Virus 2 - negative  LDL - 182  HgbA1c - 5.1  VTE prophylaxis -Heparin subq  warfarin daily prior to admission, now on heparin IV  Therapy recommendations:  Signed off, reorder once off vent  Disposition:  Pending  Afib with RVR  On amiodarone and Cardizem drip  Still has RVR due to agitation  On lopressor 25 bid (on 100 bid at home) increase as needed    On warfarin PTA, INR 1.1 on  admission  On aspirin for now  On heparin IV  AMS with agitation, chronic benzo use Respiratory failure with hypoxia  Intubated   Weaned off precedex and fentanyl and versed  On resperidone 1mg  bid, klonopin 0.5 bid, oxycodone 5 mb q6h  CXR Increased right pleural effusion with associated right basilar atelectasis or infiltrate  Tachypnea on SBT received bolus sedation  CCM on board  Extubate as able  B12 deficiency  Vitamin B12 = 161  B12 supplement  History of stroke  11/2014, bilateral blurry vision after left upper extremity bypass surgery.  Found to have left central vision loss.  MRI showed bilateral embolic infarcts.  INR 1.45.  Carotid Doppler negative.  TTE unremarkable.  LDL 82, A1c 5.4.  Continued on discharge with Coumadin and Lipitor 40  hypotension Hx of hypertension  Home BP meds: Diltiazem, Lasix and metoprolol  Current BP meds clonidine 0.3 q 6h , metoprolol 25 bid  BP stable  Off neo . Avoid low BP  Hyperlipidemia  Home Lipid lowering medication: Lipitor 40 mg daily  LDL 182, goal < 70  Current lipid lowering medication: Lipitor 80 mg daily   Continue statin at discharge  Dysphagia  N.p.o.  Speech to follow once extubated  P.o. meds through tube  On bolus feed  VAP Fever and leukocytosis  T-max 101.7->100.2->100.4->101.5->99.6  leucocytosis - 11.1->11.7->11.0->14.0->13.4->12.6->10.0->13.4->7.8->9.0  CXR with increased R pleural effusion w/ R basilar atx/infiltrate  UA neg 01/18/19  On cefepime (started 01/26/19 x 7 days) for S.pneumoniae and E.cloacae in sputum  CCM on board  Tobacco abuse  Current smoker  Smoking cessation counseling will be provided  Pt is willing to quit  Other Stroke Risk Factors  Advanced age  ETOH use, advised to drink no more than 1 alcoholic beverage per day.  Other Active Problems  Hypokalemia 3.3->3.4->3.3->3.1->4.0->4.3->4.4->4.4->4.0->3.4->3.7  Oral thrush on  nystatin  Mild Anemia - Hb - 11.9   Hospital day # 13 Continue IV heparin till patient has a  PEG tube   and can be switched to warfarin.  Patient prognosis remains poor given his significant aphasia and hemiparesis is unlikely to survive without prolong feeding tube and nursing care.  I spoke to the patient's brother over the phone y`day and answered his questions about his prognosis and he states that his brother was a Nurse, adult and would have wanted to live at all cost and is agreeable to tracheostomy and PEG tube and nursing home placement.  I explained to him that inability to wean off ventilatory support usually indicates need for long-term ventilatory care and there are limited options in nursing homes and facilities  that can provide that level of care.  He voiced understanding and wants to continue present treatment. I recommend palliative care consult  To discuss with his brother his poor prognosis as he seems unrealistic. discussed with Dr.Yacoub critical care medicine.  No family available at the bedside for discussion. This patient is critically ill and at significant risk of neurological worsening, death and care requires constant monitoring of vital signs, hemodynamics,respiratory and cardiac monitoring, extensive review of multiple databases, frequent neurological assessment, discussion with family, other specialists and medical decision making of high complexity. I spent 35 minutes of neurocritical care time  in the care of  this patient.  Antony Contras, MD To contact Stroke Continuity provider, please refer to [email or web address] After hours, contact General Neurology

## 2019-01-31 NOTE — Progress Notes (Signed)
  ANTICOAGULATION CONSULT NOTE - Follow-Up  Pharmacy Consult for Heparin Indication: atrial fibrillation and stroke  Allergies  Allergen Reactions  . Oxycodone Itching    Patient Measurements: Height: 5' 8.5" (174 cm) Weight: 189 lb 9.5 oz (86 kg) IBW/kg (Calculated) : 69.55  Ideal body weight: 69.6 kg (153 lb 5.3 oz) Adjusted ideal body weight: 76.1 kg (167 lb 13.4 oz)  Heparin Wt: 84.2 kg  Vital Signs: Temp: 99.7 F (37.6 C) (12/23 1130) Temp Source: Oral (12/23 1130) BP: 146/94 (12/23 1100) Pulse Rate: 120 (12/23 1100)  Labs: Recent Labs    01/29/19 0245 01/30/19 0237 01/31/19 0345 01/31/19 1024  HGB 11.5* 11.5* 12.7*  --   HCT 35.6* 35.5* 38.5*  --   PLT 474* 537* 543*  --   HEPARINUNFRC 0.28* <0.10* 0.15* <0.10*  CREATININE 0.69 0.59* 0.84  --     Estimated Creatinine Clearance: 89.5 mL/min (by C-G formula based on SCr of 0.84 mg/dL).  Assessment: 78 YOM off warfarin PTA for history of Afib who presented on 12/10 with an acute stroke. The patient required intubation so pharmacy consulted to dose Heparin for anticoagulation.   S/p PEG placement, heparin gtt restarted 12/22 PM  Heparin level undetectable s/p restart at 1800 units/hr, rate confirmed and no infusion issues.    Goal of Therapy:  Heparin level 0.3 - 0.5 units/ml; no boluses Monitor platelets by anticoagulation protocol: Yes   Plan:  Increase heparin gtt to 2050 units/hr F/u 6 hour heparin level  Bertis Ruddy, PharmD Clinical Pharmacist Please check AMION for all [place] numbers 01/31/2019 11:52 AM

## 2019-01-31 NOTE — Progress Notes (Signed)
  ANTICOAGULATION CONSULT NOTE - Follow-Up  Pharmacy Consult for Heparin Indication: atrial fibrillation and stroke  Allergies  Allergen Reactions  . Oxycodone Itching    Patient Measurements: Height: 5' 8.5" (174 cm) Weight: 189 lb 9.5 oz (86 kg) IBW/kg (Calculated) : 69.55  Ideal body weight: 69.6 kg (153 lb 5.3 oz) Adjusted ideal body weight: 76.1 kg (167 lb 13.4 oz)  Heparin Dosing Weight: 84.2 kg  Vital Signs: Temp: 100.4 F (38 C) (12/23 1806) Temp Source: Oral (12/23 1806) BP: 142/85 (12/23 1915) Pulse Rate: 107 (12/23 1915)  Labs: Recent Labs    01/29/19 0245 01/30/19 0237 01/31/19 0345 01/31/19 1024 01/31/19 1839  HGB 11.5* 11.5* 12.7*  --   --   HCT 35.6* 35.5* 38.5*  --   --   PLT 474* 537* 543*  --   --   HEPARINUNFRC 0.28* <0.10* 0.15* <0.10* 0.32  CREATININE 0.69 0.59* 0.84  --   --     Estimated Creatinine Clearance: 89.5 mL/min (by C-G formula based on SCr of 0.84 mg/dL).  Assessment: 68 yr old male off warfarin PTA for history of Afib presented on 12/10 with an acute stroke. The patient required intubation, so pharmacy was consulted to dose  heparin for anticoagulation.   S/P PEG placement, heparin infusion restarted 12/22 PM.  Heparin level ~6 hrs after heparin infusion was increased to 2050 units/hr earlier today is 0.32 units/ml, which is within the goal range for this pt. H/H 12.7/38.5, platelets 543. Per RN, no issues with IV or bleeding observed.  Goal of Therapy:  Heparin level 0.3 - 0.5 units/ml; no boluses Monitor platelets by anticoagulation protocol: Yes   Plan:  Continue heparin infusion at 2050 units/hr Check confirmatory heparin level in 6 hrs Monitor daily heparin level, CBC Monitor for signs/symptoms of bleeding  Gillermina Hu, PharmD, BCPS, [facility] Clinical Pharmacist 01/31/2019 7:32 PM

## 2019-01-31 NOTE — Progress Notes (Signed)
Pt's brother called and again requested that a physician place a bed request at [facility]. He also asked that he be notified if and why a bed request will not be made. Dr. Nelda Marseille notified.

## 2019-01-31 NOTE — Progress Notes (Signed)
SLP Cancellation Note  Patient Details Name: AUNDRE PEPLOW MRN: [id number] DOB: 06-12-1950   Cancelled treatment:        Checked on pt status with charge RN, Lattie Haw who reported pt has not been adequately alert nor interactive. He is currently not appropriate for inline PMV speaking valve. Will follow for pmv if pt moves to trach collar trials or if appears able to attempt PMV on vent with increased alertness and command following.    [name] 01/31/2019, 11:57 AM    Orbie Pyo Colvin Caroli.Ed Risk analyst [phone] Office [phone]

## 2019-01-31 NOTE — Progress Notes (Signed)
NAME:  [name], MRN:  [id number], DOB:  01-Jan-1951, LOS: 23 ADMISSION DATE:  01/18/2019, CONSULTATION DATE:  01/18/19 REFERRING MD:  Leonel Ramsay, CHIEF COMPLAINT:  Unable to elicit   Brief History   47yM with hypertensive emergency in setting of acute L MCA stroke s/p tPA, AF/RVR.  Has been noncompliant with his warfarin for A. Fib. Code stroke activated, TPA administered. Intubated for respiratory failure. PCCM consulted for vent management  Significant Hospital Events   12/10 tPA administered at 1946 12/11 intubated  Consults:  PCCM  Procedures:  None  Significant Diagnostic Tests:  CTA h/n 12/10 > distal MCA branches that lose opacification and there could be a few small emboli in the left MCA territory.  CT head 12/11 > Interval demarcation of an acute cortically based infarct within the lateral left frontal lobe as described. No acute intracranial hemorrhage or significant mass effect.  MRI brain 12/12 >  1. Multifocal acute ischemia within the left MCA territory, including the left postcentral gyrus. No hemorrhage or mass effect. 2. 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 3. Chronic ischemic microangiopathy.  Micro Data:  Blood 12/10 > NGTD Urine 12/10 > insignificant growth  MRSA surveillance 12/11 > negative  SARS Coronavirus 2 12/11 > negative  Antimicrobials:  Cefepime 12/18>>  Interim history/subjective:  Remains unresponsive off sedation   Objective   Blood pressure (!) 152/105, pulse (!) 122, temperature (!) 100.4 F (38 C), temperature source Oral, resp. rate (!) 22, height 5' 8.5" (1.74 m), weight 86 kg, SpO2 98 %.    Vent Mode: PRVC FiO2 (%):  [40 %] 40 % Set Rate:  [12 bmp] 12 bmp Vt Set:  [540 mL] 540 mL PEEP:  [5 cmH20] 5 cmH20 Plateau Pressure:  [13 cmH20-17 cmH20] 17 cmH20   Intake/Output Summary (Last 24 hours) at 01/31/2019 0900 Last data filed at 01/31/2019 [id number] Gross per 24 hour  Intake 1136.65 ml  Output 1350 ml  Net  -213.35 ml   Filed Weights   01/29/19 0443 01/30/19 0431 01/31/19 0500  Weight: 84.8 kg 85.4 kg 86 kg   Examination: General: Chronically ill appearing male, NAD HEENT: [place]/AT, PERRL, EOM-I and trach is in a good position Neuro: Unresponsive, but withdraws to pain CV: RRR, Nl S1/S2 and -M/R/G PULM: Coarse diffusely GI: Soft, NT, ND and +BS Extremities: warm/dry, no edema  Skin: no rashes or lesions  I reviewed CXR myself, trach is in a good position and bilateral pleural effusion noted  Resolved Hospital Problem list   Hypertensive emergency   Assessment & Plan:  Acute stroke with distal branch occlusions of left MCA -Status post tPA Plan: Management per Neuro   Acute respiratory failure due to CVA Plan: S/p tracheostomy on 12/21 Attempt TC today if able to maintain PS trials. Head of bed 30 degrees Follow intermittent CXR and ABG VAP bundle in place PAD protocol  Discontinue IVF, can add enteral free H20  Acute Encephalopathy Combination of chronic benzodiazepine use, icu delirium. Plan: Has been on high doses of sedating medications. Taper risperdal, was on klonopin which is also being tapered Will stop depakote - not a home med, was added for agitation. Can use precedex if he needs a continuous sedating agent.  VAP -Developed low grade fever overnight 12/15 with worsening right sided opacities on CXR sputum culture 12/16 with abundant gram positive cocci.  Plan:  Continue to follow formal culture results  Continue IV antibiotics, will finish 7 days of cefepime for suspected gram negative  bacteria pna Frequent oral care   Atrial fibrillation with RVR -now rate contolled Plan: Continue PO Cardizem, lopressor,  and Amiodarone IV heparin Continuous telemetry   Oral thrush Plan: Nystatin suspension  Continue frequent oral care   Best practice:  Diet:TF bolus Pain/Anxiety/Delirium protocol (if indicated): see sedation management as above VAP protocol (if  indicated): In place DVT prophylaxis: SCDs GI prophylaxis: PPI  Glucose control: has not required coverage. D/c accu checks. Hypoglycemia protocol  Mobility: Bedrest Code Status: Full code Family Communication: I updated the patients brother 12/20. Plan is for tracheostomy and peg placement this week.  Disposition:  ICU  Labs and imaging for past 48 hours reviewed in EMR    The patient is critically ill with multiple organ systems failure and requires high complexity decision making for assessment and support, frequent evaluation and titration of therapies, application of advanced monitoring technologies and extensive interpretation of multiple databases.   Critical Care Time devoted to patient care services described in this note is  32  Minutes. This time reflects time of care of this signee Dr [name]. This critical care time does not reflect procedure time, or teaching time or supervisory time of PA/NP/Med student/Med Resident etc but could involve care discussion time.  Rush Farmer, M.D. [facility] Pulmonary/Critical Care Medicine.

## 2019-01-31 NOTE — Progress Notes (Signed)
[facility] Progress Note  1 Day Post-Op  Subjective: Patient is s/p bedside PEG placement yesterday. PEG working well and patient tolerating TF. Resting comfortably.   Objective: Vital signs in last 24 hours: Temp:  [97.3 F (36.3 C)-100 F (37.8 C)] 99 F (37.2 C) (12/23 0433) Pulse Rate:  [76-149] 106 (12/23 0600) Resp:  [14-31] 24 (12/23 0600) BP: (120-179)/(80-138) 167/106 (12/23 0600) SpO2:  [86 %-100 %] 86 % (12/23 0600) FiO2 (%):  [40 %] 40 % (12/23 0749) Weight:  [86 kg] 86 kg (12/23 0500) Last BM Date: 01/30/19  Intake/Output from previous day: 12/22 0701 - 12/23 0700 In: 1100.7 [I.V.:303.7; NG/GT:497; IV Piggyback:300] Out: 1350 [Urine:1350] Intake/Output this shift: No intake/output data recorded.  PE: Gen:  Sleeping, did not respond to me  Card:  Tachycardia in low 100s Pulm:  Trach c/d/i Abd: Soft, non-tender, non-distended, +BS, PEG site clean and dressing changed Skin: warm and dry, no rashes    Lab Results:  Recent Labs    01/30/19 0237 01/31/19 0345  WBC 11.2* 15.0*  HGB 11.5* 12.7*  HCT 35.5* 38.5*  PLT 537* 543*   BMET Recent Labs    01/30/19 0237 01/31/19 0345  NA 143 143  K 3.8 3.9  CL 108 106  CO2 28 28  GLUCOSE 113* 117*  BUN 18 21  CREATININE 0.59* 0.84  CALCIUM 8.2* 8.3*   PT/INR No results for input(s): LABPROT, INR in the last 72 hours. CMP     Component Value Date/Time   NA 143 01/31/2019 0345   K 3.9 01/31/2019 0345   CL 106 01/31/2019 0345   CO2 28 01/31/2019 0345   GLUCOSE 117 (H) 01/31/2019 0345   BUN 21 01/31/2019 0345   CREATININE 0.84 01/31/2019 0345   CREATININE 1.23 08/07/2014 0913   CALCIUM 8.3 (L) 01/31/2019 0345   PROT 6.5 01/18/2019 1923   ALBUMIN 3.7 01/18/2019 1923   AST 22 01/18/2019 1923   ALT 23 01/18/2019 1923   ALKPHOS 61 01/18/2019 1923   BILITOT 1.7 (H) 01/18/2019 1923   GFRNONAA >60 01/31/2019 0345   GFRNONAA >89 03/14/2014 1338   GFRAA >60 01/31/2019 0345   GFRAA >89  03/14/2014 1338   Lipase     Component Value Date/Time   LIPASE 27 02/08/2015 2250       Studies/Results: DG CHEST PORT 1 VIEW  Result Date: 01/29/2019 CLINICAL DATA:  Tracheostomy in place EXAM: PORTABLE CHEST 1 VIEW COMPARISON:  01/24/2019 FINDINGS: Interval extubation and placement of tracheostomy device. Enteric tube passes into the stomach with tip out of field of view. Central line has been removed. No pneumothorax. Similar right pleural effusion and adjacent atelectasis. Increased retrocardiac density, probably atelectasis. Stable cardiomediastinal contours. IMPRESSION: Lines and tubes as above. Similar right pleural effusion and adjacent atelectasis. Increased left basilar density, probably atelectasis. Electronically Signed   By: Macy Mis M.D.   On: 01/29/2019 14:11   DG Abd Portable 1V  Result Date: 01/29/2019 CLINICAL DATA:  Feeding tube placement EXAM: PORTABLE ABDOMEN - 1 VIEW COMPARISON:  None. FINDINGS: New enteric tube is within the stomach. Visualized bowel gas pattern is unremarkable. IMPRESSION: Enteric tube within the stomach. Electronically Signed   By: Macy Mis M.D.   On: 01/29/2019 14:21    Anti-infectives: Anti-infectives (From admission, onward)   Start     Dose/Rate Route Frequency Ordered Stop   01/26/19 1000  ceFEPIme (MAXIPIME) 2 g in sodium chloride 0.9 % 100 mL IVPB  2 g 200 mL/hr over 30 Minutes Intravenous Every 8 hours 01/26/19 0955 02/02/19 0959   01/25/19 1000  Ampicillin-Sulbactam (UNASYN) 3 g in sodium chloride 0.9 % 100 mL IVPB  Status:  Discontinued     3 g 200 mL/hr over 30 Minutes Intravenous Every 8 hours 01/25/19 0810 01/26/19 0955       Assessment/Plan Acute left MCA CVA s/p tPA VDRF secondary to above - tracheostomy planned for 12/21 VAP - IV abx per CCM Agitation A. Fib with RVR Chronic BDZ use Oral thrush  Dysphagia after CVA Consult for PEG placement  - PEG placed 12/22 - tolerating TF, abdominal exam  benign - PEG site c/d/i  We will sign off. Please re-consult if we can be of further assistance.   LOS: 13 days    Brigid Re , [facility] 01/31/2019, 7:54 AM Please see Amion for pager number during day hours 7:00am-4:30pm

## 2019-02-01 LAB — CBC
HCT: 35 % — ABNORMAL LOW (ref 39.0–52.0)
Hemoglobin: 11.6 g/dL — ABNORMAL LOW (ref 13.0–17.0)
MCH: 34.4 pg — ABNORMAL HIGH (ref 26.0–34.0)
MCHC: 33.1 g/dL (ref 30.0–36.0)
MCV: 103.9 fL — ABNORMAL HIGH (ref 80.0–100.0)
Platelets: 680 10*3/uL — ABNORMAL HIGH (ref 150–400)
RBC: 3.37 MIL/uL — ABNORMAL LOW (ref 4.22–5.81)
RDW: 15 % (ref 11.5–15.5)
WBC: 15.6 10*3/uL — ABNORMAL HIGH (ref 4.0–10.5)
nRBC: 0 % (ref 0.0–0.2)

## 2019-02-01 LAB — BASIC METABOLIC PANEL
Anion gap: 8 (ref 5–15)
BUN: 19 mg/dL (ref 8–23)
CO2: 27 mmol/L (ref 22–32)
Calcium: 8.2 mg/dL — ABNORMAL LOW (ref 8.9–10.3)
Chloride: 105 mmol/L (ref 98–111)
Creatinine, Ser: 0.63 mg/dL (ref 0.61–1.24)
GFR calc Af Amer: >60 mL/min (ref >60)
GFR calc non Af Amer: >60 mL/min (ref >60)
Glucose, Bld: 93 mg/dL (ref 70–99)
Potassium: 3.7 mmol/L (ref 3.5–5.1)
Sodium: 140 mmol/L (ref 135–145)

## 2019-02-01 LAB — GLUCOSE, CAPILLARY
Glucose-Capillary: 85 mg/dL (ref 70–99)
Glucose-Capillary: 94 mg/dL (ref 70–99)
Glucose-Capillary: 128 mg/dL — ABNORMAL HIGH (ref 70–99)
Glucose-Capillary: 116 mg/dL — ABNORMAL HIGH (ref 70–99)
Glucose-Capillary: 104 mg/dL — ABNORMAL HIGH (ref 70–99)
Glucose-Capillary: 149 mg/dL — ABNORMAL HIGH (ref 70–99)

## 2019-02-01 LAB — MAGNESIUM: Magnesium: 2.1 mg/dL (ref 1.7–2.4)

## 2019-02-01 LAB — HEPARIN LEVEL (UNFRACTIONATED): Heparin Unfractionated: 0.3 IU/mL (ref 0.30–0.70)

## 2019-02-01 LAB — PHOSPHORUS: Phosphorus: 2.8 mg/dL (ref 2.5–4.6)

## 2019-02-01 MED ORDER — WARFARIN - PHARMACIST DOSING INPATIENT
Freq: Every day | Status: DC
  Filled 2019-02-01: qty 1

## 2019-02-01 MED ORDER — WARFARIN SODIUM 7.5 MG PO TABS
7.5000 mg | ORAL_TABLET | Freq: Once | ORAL | Status: AC
  Administered 2019-02-01: 18:00:00 7.5 mg via ORAL
  Filled 2019-02-01: qty 1

## 2019-02-01 NOTE — Progress Notes (Addendum)
STROKE TEAM PROGRESS NOTE   INTERVAL HISTORY Patient remains on ventilatory support for his respiratory failure and sedated.   he remains aphasic and not following any commands consistently.  Currently on Abx and heparin IV.  Marland Kitchen His roommate is at the bedside .  The patient bedside nurse this morning felt that patient may have followed some commands on the left for him but he did not do so for me during my exam today.  I called and spoke again to his brother and explained the patient's situation and lack of significant progress but he still  wanted me to call and try to transfer to [facility] to "" see if they could offer him something else and latest that we cannot "" OBJECTIVE Vitals:   02/01/19 1123 02/01/19 1200 02/01/19 1300 02/01/19 1400  BP:  132/87 123/71 120/76  Pulse:  74 70 75  Resp:  (!) 24 19 (!) 24  Temp: 98.5 F (36.9 C)     TempSrc: Axillary     SpO2:  100% 98% 98%  Weight:      Height:        CBC:  Recent Labs  Lab 01/31/19 0345 02/01/19 0253  WBC 15.0* 15.6*  HGB 12.7* 11.6*  HCT 38.5* 35.0*  MCV 105.2* 103.9*  PLT 543* 680*    Basic Metabolic Panel:  Recent Labs  Lab 01/31/19 0345 02/01/19 0253  NA 143 140  K 3.9 3.7  CL 106 105  CO2 28 27  GLUCOSE 117* 93  BUN 21 19  CREATININE 0.84 0.63  CALCIUM 8.3* 8.2*  MG  --  2.1  PHOS  --  2.8    Lipid Panel:     Component Value Date/Time   CHOL 238 (H) 01/19/2019 0437   TRIG 64 01/19/2019 0437   HDL 43 01/19/2019 0437   CHOLHDL 5.5 01/19/2019 0437   VLDL 13 01/19/2019 0437   LDLCALC 182 (H) 01/19/2019 0437   HgbA1c:  Lab Results  Component Value Date   HGBA1C 5.1 01/19/2019   Urine Drug Screen:     Component Value Date/Time   LABOPIA NONE DETECTED 07/13/2016 1428   COCAINSCRNUR NONE DETECTED 07/13/2016 1428   LABBENZ POSITIVE (A) 07/13/2016 1428   AMPHETMU NONE DETECTED 07/13/2016 1428   THCU POSITIVE (A) 07/13/2016 1428   LABBARB NONE DETECTED 07/13/2016 1428    Alcohol Level      Component Value Date/Time   ETH <5 02/09/2015 0010    IMAGING  CT HEAD WO CONTRAST 01/24/2019 IMPRESSION: Involving acute/subacute infarcts in the left frontal parietal lobe and left occipital lobe. Small amount of hemorrhage in the left posterior frontal infarct unchanged from prior CT and MRI. Electronically Signed   By: Franchot Gallo M.D.   On: 01/24/2019 16:18   MRI Wo Contrast  01/20/2019 IMPRESSION: 1. Multifocal acute ischemia within the left MCA territory, including the left postcentral gyrus. No hemorrhage or mass effect. 2. 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 3. Chronic ischemic microangiopathy.  CT Head Wo Contrast  01/19/2019 IMPRESSION: Interval demarcation of an acute cortically based infarct within the lateral left frontal lobe as described. No acute intracranial hemorrhage or significant mass effect.  CT Code Stroke CTA Head W/WO contrast CT Code Stroke CTA Neck W/WO contrast 01/18/2019 IMPRESSION:   Atherosclerotic disease at both carotid bifurcations but no stenosis.   Prominent soft plaque at the left ICA bulb.   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   Dominant right vertebral artery widely patent.   No intracranial large or medium vessel occlusion or correctable proximal stenosis, with specific attention to the left MCA branches.   I do think there are a few distal MCA branches that lose opacification and there could be a few small emboli in the left MCA territory. Vessels marked with arrows.   Distal vessel atherosclerotic irregularity, particularly evident in the PCA branches.   Aortic Atherosclerosis (ICD10-I70.0)   Emphysema (ICD10-J43.9).   CT HEAD CODE STROKE WO CONTRAST 01/18/2019  IMPRESSION: 1. No acute finding by CT. Chronic small-vessel ischemic changes of the white matter. Old right cerebellar and right occipital infarctions.  2. ASPECTS is 10    Transthoracic Echocardiogram  01/19/2019 IMPRESSIONS  1. Left ventricular ejection fraction, by visual estimation, is 55 to 60%. The left ventricle has normal function. There is moderately increased left ventricular hypertrophy.  2. The left ventricle demonstrates regional wall motion abnormalities.  3. Global right ventricle has normal systolic function.The right ventricular size is normal. No increase in right ventricular wall thickness.  4. Left atrial size was normal.  5. Right atrial size was mildly dilated.  6. The mitral valve is normal in structure. Mild to moderate mitral valve regurgitation.  7. The tricuspid valve is normal in structure. Tricuspid valve regurgitation is trivial.  8. The aortic valve is normal in structure. Aortic valve regurgitation is not visualized.  9. The pulmonic valve was normal in structure. Pulmonic valve regurgitation is not visualized. 10. The atrial septum is grossly normal.  ECG atrial fibrillation - ventricular response 133 BPM (See cardiology reading for complete details)  PHYSICAL EXAM  Blood pressure 120/76, pulse 75, temperature 98.5 F (36.9 C), temperature source Axillary, resp. rate (!) 24, height 5' 8.5" (1.74 m), weight 87 kg, SpO2 98 %.  General -frail elderly Caucasian male, intubated on sedation  Ophthalmologic - fundi not visualized due to noncooperation.  Cardiovascular - irregularly irregular heart rate and rhythm.  Neuro - intubated  on sedation, eyes closed, not following commands.  Globally aphasic.  Barely open eyes with pain stimulation. With forced eye opening, pt resisted eye opening bilaterally, eyes in mid position, not blinking to visual threat bilaterally, not tracking, pupil bilaterally 2 mm, sluggish to light. Doll's eyes sluggish, corneal reflex weakly present L>R, gag and cough present.  Breathing over the vent.  Facial symmetry not able to test due to ET tube. On pain stimulation, no movement of right extremity  but has some tone and movement in the left upper extremity, slight toe and leg movement at BLEs. Sensation, coordination and gait not tested.   ASSESSMENT/PLAN [name] is a 68 y.o. male with history of tobacco use, previous strokes, COPD, AAA, ASPVD, htn, hld, pulmonary nodule, depression, atrial fibrillation on coumadin but recently missed doses presenting with transient aphasia and leaning to the right. The patient received IV t-PA Thursday 01/18/19 @ 2000.  Stroke - left MCA infarct s/p t-PA - embolic pattern, likely due to atrial fibrillation with subtherapeutic INR  Resultant global aphasia, left gaze, right hemiparesis, right neglect and agitation  Code Stroke CT Head - No acute finding by CT. Chronic small-vessel ischemic changes of the white matter. Old right cerebellar and right occipital infarctions. ASPECTS is 10   CT head - Interval demarcation of an acute cortically based infarct within the lateral left frontal lobe as described. No acute intracranial hemorrhage or significant mass effect.  MRI head -  Multifocal  acute ischemia within the left MCA territory, including the left postcentral gyrus. No hemorrhage or mass effect. 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 Chronic ischemic microangiopathy.  CTA H&N - Prominent soft plaque at the left ICA bulb. 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   CT 12/16 stable, continued evolution of left MCA infarcts  2D Echo - EF 55 to 60%. No cardiac source of emboli identified.   [employer] Virus 2 - negative  LDL - 182  HgbA1c - 5.1  VTE prophylaxis -Heparin subq  warfarin daily prior to admission, now on heparin IV  Therapy recommendations:  Signed off, reorder once off vent  Disposition:  Pending  Afib with RVR  On amiodarone and Cardizem drip  Still has RVR due to agitation  On lopressor 25 bid (on 100 bid at home) increase as needed     On warfarin PTA, INR 1.1 on admission  On aspirin for now  On heparin IV  AMS with agitation, chronic benzo use Respiratory failure with hypoxia  Intubated   Weaned off precedex and fentanyl and versed  On resperidone 1mg  bid, klonopin 0.5 bid, oxycodone 5 mb q6h  CXR Increased right pleural effusion with associated right basilar atelectasis or infiltrate  Tachypnea on SBT received bolus sedation  CCM on board  Extubate as able  B12 deficiency  Vitamin B12 = 161  B12 supplement  History of stroke  11/2014, bilateral blurry vision after left upper extremity bypass surgery.  Found to have left central vision loss.  MRI showed bilateral embolic infarcts.  INR 1.45.  Carotid Doppler negative.  TTE unremarkable.  LDL 82, A1c 5.4.  Continued on discharge with Coumadin and Lipitor 40  hypotension Hx of hypertension  Home BP meds: Diltiazem, Lasix and metoprolol  Current BP meds clonidine 0.3 q 6h , metoprolol 25 bid  BP stable  Off neo . Avoid low BP  Hyperlipidemia  Home Lipid lowering medication: Lipitor 40 mg daily  LDL 182, goal < 70  Current lipid lowering medication: Lipitor 80 mg daily   Continue statin at discharge  Dysphagia  N.p.o.  Speech to follow once extubated  P.o. meds through tube  On bolus feed  VAP Fever and leukocytosis  T-max 101.7->100.2->100.4->101.5->99.6  leucocytosis - 11.1->11.7->11.0->14.0->13.4->12.6->10.0->13.4->7.8->9.0  CXR with increased R pleural effusion w/ R basilar atx/infiltrate  UA neg 01/18/19  On cefepime (started 01/26/19 x 7 days) for S.pneumoniae and E.cloacae in sputum  CCM on board  Tobacco abuse  Current smoker  Smoking cessation counseling will be provided  Pt is willing to quit  Other Stroke Risk Factors  Advanced age  ETOH use, advised to drink no more than 1 alcoholic beverage per day.  Other Active Problems  Hypokalemia  3.3->3.4->3.3->3.1->4.0->4.3->4.4->4.4->4.0->3.4->3.7  Oral thrush on nystatin  Mild Anemia - Hb - 11.9   Hospital day # 14 Start warfarin since he now has a PEG but will continue IV heparin till INR is therapeutic.  Patient prognosis remains poor given his significant aphasia and hemiparesis , pneumonia and respiratory failure with difficulty in weaning  heis unlikely to survive without prolong feeding tube and nursing care.  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  He voiced understanding and wants to continue present treatment.. Brother however seems unrealistic and requests we call [facility] for possible transfer as he feels they may have things to offer to this patient that we cannot.  I called and left a message with the operator at [facility] transfer center with patient details and they will call me back about the transfer. discussed with Dr.Yacoub critical care medicine.  No family available at the bedside for discussion. This patient is critically ill and at significant risk of neurological worsening, death and care requires constant monitoring of vital signs, hemodynamics,respiratory and cardiac monitoring, extensive review of multiple databases, frequent neurological assessment, discussion with family, other specialists and medical decision making of high complexity. I spent 35 minutes of neurocritical care time  in the care of  this patient.    Antony Contras, MD To contact Stroke Continuity provider, please refer to [email or web address] After hours, contact General Neurology  ADDEDNDUM : I had telephonic discussion with Dr. Radene Knee neuro  critical care fellow on-call at [facility] about potential transfer upon family request of this patient to [facility] after discussion about the case Dr. Radene Knee declined transfer stating there is not anything more that [facility] had to offer this patient.  I informed the patient's brother about this. Antony Contras, MD Medical Director [facility] Pager: [phone] 02/01/2019 3:52 PM

## 2019-02-01 NOTE — Progress Notes (Signed)
PALLIATIVE NOTE:  Referral received for goals of care discussion. Assessed patient at the bedside, chart reviewed, and updates received from RN.   I attempted to call patient's brother, [name] [phone]). No answer and no voicemail to leave message. Will continue to attempt to reach family.   Detailed notes and recommendations to follow once goals of care is completed.   Thank you for your referral and allowing Palliative to assist in Mr. Orman's care.   Alda Lea, AGPCNP-BC Palliative Medicine Team  Phone: [phone] Fax: [phone] Pager: [phone] Amion: N. Cousar   NO CHARGE

## 2019-02-01 NOTE — Progress Notes (Signed)
Patients roommate was at the bedside and was asked to step out while getting patient cleaned up. Patient became verbally aggressive with staff in the hallway and was told if he doesn't calm down then he would be asked to leave. Roommate wanted to get his phone and said he would leave. Staff gave him his phone and charger and he walked out and said he would be back tomorrow.

## 2019-02-01 NOTE — Progress Notes (Signed)
NAME:  KYRIAKOS FORGETTE, MRN:  [id number], DOB:  1950-10-24, LOS: 55 ADMISSION DATE:  01/18/2019, CONSULTATION DATE:  01/18/19 REFERRING MD:  Leonel Ramsay, CHIEF COMPLAINT:  Unable to elicit   Brief History   89yM with hypertensive emergency in setting of acute L MCA stroke s/p tPA, AF/RVR.  Has been noncompliant with his warfarin for A. Fib. Code stroke activated, TPA administered. Intubated for respiratory failure. PCCM consulted for vent management  Significant Hospital Events   12/10 tPA administered at 1946 12/11 intubated  Consults:  PCCM  Procedures:  None  Significant Diagnostic Tests:  CTA h/n 12/10 > distal MCA branches that lose opacification and there could be a few small emboli in the left MCA territory.  CT head 12/11 > Interval demarcation of an acute cortically based infarct within the lateral left frontal lobe as described. No acute intracranial hemorrhage or significant mass effect.  MRI brain 12/12 >  1. Multifocal acute ischemia within the left MCA territory, including the left postcentral gyrus. No hemorrhage or mass effect. 2. 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 3. Chronic ischemic microangiopathy.  Micro Data:  Blood 12/10 > NGTD Urine 12/10 > insignificant growth  MRSA surveillance 12/11 > negative  SARS Coronavirus 2 12/11 > negative  Antimicrobials:  Cefepime 12/18>>  Interim history/subjective:  Remains unresponsive, no events overnight   Objective   Blood pressure 135/90, pulse 90, temperature 98.9 F (37.2 C), temperature source Axillary, resp. rate 14, height 5' 8.5" (1.74 m), weight 87 kg, SpO2 99 %.    Vent Mode: PRVC FiO2 (%):  [40 %-60 %] 60 % Set Rate:  [12 bmp] 12 bmp Vt Set:  [540 mL] 540 mL PEEP:  [5 cmH20] 5 cmH20 Plateau Pressure:  [15 cmH20-23 cmH20] 16 cmH20   Intake/Output Summary (Last 24 hours) at 02/01/2019 0955 Last data filed at 02/01/2019 0900 Gross per 24 hour  Intake 2150.87 ml  Output 1650 ml  Net  500.87 ml   Filed Weights   01/30/19 0431 01/31/19 0500 02/01/19 0500  Weight: 85.4 kg 86 kg 87 kg   Examination: General: Chronically ill appearing male, NAD HEENT: [place]/AT, PERRL, EOM-I and MMM, trach in place Neuro: Unresponsive, withdraws some to pain CV: RRR, Nl S1/S2 and -M/R/G PULM: Coarse diffusely GI: Soft, NT, ND and +BS Extremities: warm/dry, no edema  Skin: no rashes or lesions  I reviewed CXR myself, trach is in a good position with bilateral pleural effusion  Resolved Hospital Problem list   Hypertensive emergency   Assessment & Plan:  Acute stroke with distal branch occlusions of left MCA -Status post tPA Plan: Management per neuro  Acute respiratory failure due to CVA Plan: S/p tracheostomy on 12/21 Start PS trials and attempt TC if able Head of bed 30 degrees Follow intermittent CXR and ABG VAP bundle in place PAD protocol  D/C IVF and continue free water for hypernatremia  Acute Encephalopathy Combination of chronic benzodiazepine use, icu delirium. Plan: Has been on high doses of sedating medications. Taper risperdal, was on klonopin which is now off D/C precedex  VAP -Developed low grade fever overnight 12/15 with worsening right sided opacities on CXR sputum culture 12/16 with abundant gram positive cocci.  Plan:  Continue to follow formal culture results  Continue IV antibiotics, will finish 7 days of cefepime for suspected gram negative bacteria pna, stop date in place Frequent oral care   Atrial fibrillation with RVR -now rate contolled Plan: Continue PO Cardizem, lopressor,  and Amiodarone IV  heparin Continuous telemetry   Oral thrush Plan: Nystatin suspension  Continue frequent oral care   Discussed with bedside RN and RT  Best practice:  Diet:TF bolus Pain/Anxiety/Delirium protocol (if indicated): see sedation management as above VAP protocol (if indicated): In place DVT prophylaxis: SCDs GI prophylaxis: PPI  Glucose  control: has not required coverage. D/c accu checks. Hypoglycemia protocol  Mobility: Bedrest Code Status: Full code Family Communication: I updated the patients brother 12/20. Plan is for tracheostomy and peg placement this week.  Disposition:  ICU  Labs and imaging for past 48 hours reviewed in EMR    Rush Farmer, M.D. [facility] Pulmonary/Critical Care Medicine.

## 2019-02-01 NOTE — Progress Notes (Addendum)
  ANTICOAGULATION CONSULT NOTE - Follow-Up  Pharmacy Consult for Heparin Indication: atrial fibrillation and stroke  Allergies  Allergen Reactions  . Oxycodone Itching    Patient Measurements: Height: 5' 8.5" (174 cm) Weight: 191 lb 12.8 oz (87 kg) IBW/kg (Calculated) : 69.55  Ideal body weight: 69.6 kg (153 lb 5.3 oz) Adjusted ideal body weight: 76.5 kg (168 lb 11.5 oz)  Heparin Dosing Weight: 84.2 kg  Vital Signs: Temp: 98.9 F (37.2 C) (12/24 0715) Temp Source: Axillary (12/24 0715) BP: 141/88 (12/24 0500) Pulse Rate: 109 (12/24 0400)  Labs: Recent Labs    01/30/19 0237 01/31/19 0345 01/31/19 1024 01/31/19 1839 02/01/19 0253  HGB 11.5* 12.7*  --   --  11.6*  HCT 35.5* 38.5*  --   --  35.0*  PLT 537* 543*  --   --  680*  HEPARINUNFRC <0.10* 0.15* <0.10* 0.32 0.30  CREATININE 0.59* 0.84  --   --  0.63    Estimated Creatinine Clearance: 94.4 mL/min (by C-G formula based on SCr of 0.63 mg/dL).  Assessment: 68 yr old male off warfarin PTA for history of Afib presented on 12/10 with an acute stroke. The patient required intubation, so pharmacy was consulted to dose  heparin for anticoagulation.   S/P PEG placement, heparin infusion restarted 12/22 PM.  Heparin level lower end therapeutic x2, CBC stable  Goal of Therapy:  Heparin level 0.3 - 0.5 units/ml; no boluses Monitor platelets by anticoagulation protocol: Yes   Plan:  Increase heparin gtt slightly to 2100 units/hr Daily heparin level, CBC, s/s bleeding F/u restart of warfarin  Addendum: Will also restart warfarin today Give warfarin 7.5mg  PO x 1 tonight Daily INR  Bertis Ruddy, PharmD Clinical Pharmacist Please check AMION for all [place] numbers 02/01/2019 8:27 AM

## 2019-02-01 NOTE — Progress Notes (Signed)
SLP Cancellation Note  Patient Details Name: [name] MRN: [id number] DOB: 03-08-1950   Cancelled treatment:        On vent. Per neuro note, his prognosis appears poor. Not alert. ST will sign off at this time. If he has ST needs in the future, please reconsult.    [name] 02/01/2019, 8:19 AM   [name].Ed Risk analyst [phone] Office [phone]

## 2019-02-02 DIAGNOSIS — Z7189 Other specified counseling: Secondary | ICD-10-CM

## 2019-02-02 DIAGNOSIS — R209 Unspecified disturbances of skin sensation: Secondary | ICD-10-CM

## 2019-02-02 DIAGNOSIS — Z515 Encounter for palliative care: Secondary | ICD-10-CM

## 2019-02-02 LAB — CBC
HCT: 37.2 % — ABNORMAL LOW (ref 39.0–52.0)
Hemoglobin: 12.2 g/dL — ABNORMAL LOW (ref 13.0–17.0)
MCH: 34.6 pg — ABNORMAL HIGH (ref 26.0–34.0)
MCHC: 32.8 g/dL (ref 30.0–36.0)
MCV: 105.4 fL — ABNORMAL HIGH (ref 80.0–100.0)
Platelets: 750 10*3/uL — ABNORMAL HIGH (ref 150–400)
RBC: 3.53 MIL/uL — ABNORMAL LOW (ref 4.22–5.81)
RDW: 15.1 % (ref 11.5–15.5)
WBC: 16.9 10*3/uL — ABNORMAL HIGH (ref 4.0–10.5)
nRBC: 0 % (ref 0.0–0.2)

## 2019-02-02 LAB — BASIC METABOLIC PANEL
Anion gap: 10 (ref 5–15)
BUN: 19 mg/dL (ref 8–23)
CO2: 25 mmol/L (ref 22–32)
Calcium: 8.4 mg/dL — ABNORMAL LOW (ref 8.9–10.3)
Chloride: 103 mmol/L (ref 98–111)
Creatinine, Ser: 0.62 mg/dL (ref 0.61–1.24)
GFR calc Af Amer: >60 mL/min (ref >60)
GFR calc non Af Amer: >60 mL/min (ref >60)
Glucose, Bld: 95 mg/dL (ref 70–99)
Potassium: 3.7 mmol/L (ref 3.5–5.1)
Sodium: 138 mmol/L (ref 135–145)

## 2019-02-02 LAB — GLUCOSE, CAPILLARY
Glucose-Capillary: 100 mg/dL — ABNORMAL HIGH (ref 70–99)
Glucose-Capillary: 109 mg/dL — ABNORMAL HIGH (ref 70–99)
Glucose-Capillary: 125 mg/dL — ABNORMAL HIGH (ref 70–99)
Glucose-Capillary: 87 mg/dL (ref 70–99)
Glucose-Capillary: 94 mg/dL (ref 70–99)
Glucose-Capillary: 98 mg/dL (ref 70–99)

## 2019-02-02 LAB — HEPARIN LEVEL (UNFRACTIONATED): Heparin Unfractionated: 0.42 IU/mL (ref 0.30–0.70)

## 2019-02-02 LAB — PHOSPHORUS: Phosphorus: 2.8 mg/dL (ref 2.5–4.6)

## 2019-02-02 LAB — MAGNESIUM: Magnesium: 2.1 mg/dL (ref 1.7–2.4)

## 2019-02-02 LAB — PROTIME-INR
INR: 1.2 (ref 0.8–1.2)
Prothrombin Time: 14.6 seconds (ref 11.4–15.2)

## 2019-02-02 MED ORDER — WARFARIN SODIUM 5 MG PO TABS
5.0000 mg | ORAL_TABLET | Freq: Once | ORAL | Status: AC
  Administered 2019-02-02: 17:00:00 5 mg via ORAL
  Filled 2019-02-02: qty 1

## 2019-02-02 NOTE — Consult Note (Signed)
Consultation Note Date: 02/02/2019   Patient Name: [name]  DOB: 1950-10-08  MRN: [id number]  Age / Sex: 68 y.o., male   PCP: Shon Baton, MD Referring Physician: Garvin Fila, MD   REASON FOR CONSULTATION:Establishing goals of care  Palliative Care consult requested for goals of care in this 68 y.o. male with multiple medical problems including atrial fibrillation (Coumadin), abdominal aortic aneurysm, LUTS, and arthritis. [name] presented to ED via EMS with complaints of abrupt leaning to the right and aphasia. He reported on admission being in his normal state of health at 6:30pm prior to the change. A code stroke was activated as patient was en route to facility. It was reported patient was not taking his Coumadin for some time due to difficulty obtaining while in the [place]. MRI brain on showed acute ischemia within the left MCA territory. 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 COVID negative. Since admission patient required intubation due to respiratory failure and is s/p tracheostomy 12/21.   Clinical Assessment and Goals of Care: I have reviewed medical records including lab results, imaging, Epic notes, and MAR, received report from the bedside RN, and assessed the patient. I was able to speak with patient's brother [name] via phone to discuss diagnosis prognosis, [place], EOL wishes, disposition and options. Patient remains intubated.   I introduced Palliative Medicine as specialized medical care for people living with serious illness. It focuses on providing relief from the symptoms and stress of a serious illness. The goal is to improve quality of life for both the patient and the family. Brother verbalizes understanding of Palliative.   [name] shares patient loves to travel and had a passion on life. He reports patient recently returned from the [place] after traveling and staying there for the last 2 years on leisure. 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 Brother reports patient was excited about life and living. 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   Brother reports prior to admission patient had no major health concerns and again acknowledging his excitement on life.   I attempted to provided updates and discuss patient's current illness with [name]. Brother reports he is aware of how his brother is doing and unless there were changes he did not need to be reminded.    I attempted to elicit values and goals of care important to the patient.    [name] states he is aware that the "medical team" feels he has a poor prognosis and most likely will not recover however he disagrees. He reports he does not have much respect or trust in our system based on his previous experiences, emphasizing this is why he was requesting a transfer to [facility].   [name] shares his strong Darrick Meigs faith and express he is waiting for God to perform his miracle over his brother's life similar to he did in his own life.   He begins sharing his previous experience of being hospitalized at [facility] with necrotizing pancreatitis. He reports having encounters with Palliative at that time which he does express was positive and the team was caring. He states he was told by the entire medical team that he would not survive and states "I was sent home to die with hospice!" He reports having a moment from God  who told him not to doubt his powers and that it didn't make a difference what was being said a long as he trusted him. Patient reports at that time he chose to rescind hospice and go to [facility] for further evaluation. He explains being hospitalized there for over 3 months and having a feeding tube etc. He shares he was not expected to survive but God intervened and here he is doing well with no  complications and gained his 60lbs of weight loss back.   Brother reports he is only open to hearing positive updates regarding his brother. I explained that the medical team wants to be open and honest with all communication with hopes of not presenting him with surprises in his brother's health. [name] verbalized his understanding and agreed he wants honest communication but states if negative "it will go in one ear and out the other because I am believing in God!"  He is requesting full scope full code aggressive care. He understands that providers have reached out to [facility]. He states patient may not be able to go now but maybe in a few days or next week. Sharing he will not give up on his brother and seeking additional care outside of [facility].   Brother reports he is not interested in comfort or further discussions with Palliative   Questions and concerns were addressed. The family was encouraged to call with questions or concerns.  PMT will continue to support holistically.   SOCIAL HISTORY:     reports that he has been smoking cigarettes. He has a 102.00 pack-year smoking history. He has never used smokeless tobacco. He reports current alcohol use. He reports that he does not use drugs.  CODE STATUS: Full code  ADVANCE DIRECTIVES: Primary Decision Maker: [name] (brother)   SYMPTOM MANAGEMENT: per attending   Palliative Prophylaxis:   Aspiration, Bowel Regimen, Eye Care, Frequent Pain Assessment, Oral Care, Palliative Wound Care and Turn Reposition  PSYCHO-SOCIAL/SPIRITUAL:  Support System: Family   Desire for further Chaplaincy support:no   Additional Recommendations (Limitations, Scope, Preferences):  Full Scope Treatment   PAST MEDICAL HISTORY: Past Medical History:  Diagnosis Date  . AAA (abdominal aortic aneurysm) ([place])    per last CT 03-26-2016---  3.9cm  . Anticoagulated on Coumadin   . Arthritis of spine   . Benign localized prostatic hyperplasia with  lower urinary tract symptoms (LUTS)   . Chronic atrial fibrillation ([place]) cardiolgoist -- dr Stanford Breed   dx 2009  . Complication of anesthesia    per pt today (07-08-2016) had problem after having anesthesia 12/ 2016 surgery [facility] "not good" went to ED ( in epic ED visit 01/ 2017 dx severe episode of major depression disorder and admitted to [facility]  . COPD with emphysema ([place])   . Dyspnea on exertion   . Full dentures   . GERD (gastroesophageal reflux disease)   . Hematuria   . History of amaurosis fugax    right eye 02-08-2015 -- resolved /  documented in epic possible left eye amaurosis fugax 11/ 2008 (pt denies) / per last MRI show previous bilateral occipital lobe infarcts  . History of concussion    as child  . History of CVA (cerebrovascular accident) 10/ 2016  and per pt Dec 2017 in [place]   neurologist-  dr Leonie Man-- per note silent infarcts on MRI-- multiple acute infarcts bilateral cerebullm, right temporal lobe, and bilateral occipital lobes due to embolic phenomena (atrial fib.)  . History of  seizures as a child   . Hyperlipidemia   . Hypertension   . Left arm numbness    occasional numbess post residual axilla arterial occlusion and surgery  . Major depressive disorder    hx severe episode without psychosis 02/2015 w/ homicidal ideation  . Narcotic dependence ([place])   . PAD (peripheral artery disease) ([place])    followed by dr fields-- s/p  left axilla to branchial and left axilla to ulnar bypass graft due to ischemic hand  . Pulmonary nodule    per CT 03-26-2016  right lower lobe  . PVD (peripheral vascular disease) ([place])   . Right ureteral stone   . Senile purpura ([place])   . Weakness of left arm    residual from axilla arterial occlusion and post surgery  . Wears glasses     PAST SURGICAL HISTORY:  Past Surgical History:  Procedure Laterality Date  . ARCH AORTOGRAM N/A 04/05/2014   Procedure: ARCH AORTOGRAM, FIRST ORDER CATHETERIZATION LEFT SUBCLAVIAN ARTERY;   Surgeon: Elam Dutch, MD;  Location: [facility] OR;  Service: Vascular;  Laterality: N/A;  . AXILLARY-FEMORAL BYPASS GRAFT Left 04/08/2014   Procedure: LEFT AXILLARY ARTERY TO BRACHIAL ARTERY BYPASS USING NON REVERSE LEFT GREATER SAPHENOUS VEIN ,LIGATION OF LEFT AXILLARY ARTERY ANEURYSM;  Surgeon: Elam Dutch, MD;  Location: [place];  Service: Vascular;  Laterality: Left;  . BYPASS AXILLA/BRACHIAL ARTERY Left 01/27/2015   Procedure: LEFT BRACHIAL-ULAR ARTERY BYPASS USING GREATER SAPHENOUS VEIN;  Surgeon: Elam Dutch, MD;  Location: [place];  Service: Vascular;  Laterality: Left;  . CYSTOSCOPY WITH RETROGRADE PYELOGRAM, URETEROSCOPY AND STENT PLACEMENT Right 07/17/2016   Procedure: CYSTOSCOPY WITH RETROGRADE PYELOGRAM, URETEROSCOPY AND STENT PLACEMENT,LASER;  Surgeon: Festus Aloe, MD;  Location: [facility] ORS;  Service: Urology;  Laterality: Right;  . CYSTOSCOPY/URETEROSCOPY/HOLMIUM LASER/STENT PLACEMENT Right 07/13/2016   Procedure: CYSTOSCOPY, RIGHT URETEROSCOPY RETROGRADE PYELOGRAM;  Surgeon: Kathie Rhodes, MD;  Location: [facility];  Service: Urology;  Laterality: Right;  . EMBOLECTOMY Left 04/05/2014   Procedure: THROMBO ENDARTERECTOMY OF LEFT BRACHIAL, RADIAL AND ULNAR ARTERY,  Left Radial artery cut down and radial artery thrombectomy.;  Surgeon: Elam Dutch, MD;  Location: [facility] OR;  Service: Vascular;  Laterality: Left;  . ESOPHAGOGASTRODUODENOSCOPY N/A 01/30/2019   Procedure: ESOPHAGOGASTRODUODENOSCOPY (EGD);  Surgeon: Jesusita Oka, MD;  Location: [facility] ENDOSCOPY;  Service: General;  Laterality: N/A;  . facial cyst Right    cyst removal.   . PATCH ANGIOPLASTY Left 04/05/2014   Procedure: LEFT ARM VEIN PATCH ANGIOPLASTY;  Surgeon: Elam Dutch, MD;  Location: [place];  Service: Vascular;  Laterality: Left;  . PEG PLACEMENT N/A 01/30/2019   Procedure: PERCUTANEOUS ENDOSCOPIC GASTROSTOMY (PEG) PLACEMENT;  Surgeon: Jesusita Oka, MD;  Location: [place];  Service:  General;  Laterality: N/A;  . PERIPHERAL VASCULAR CATHETERIZATION N/A 01/22/2015   Procedure: Aortic Arch Angiography;  Surgeon: Elam Dutch, MD;  Location: [place] CV LAB;  Service: Cardiovascular;  Laterality: N/A;  . THROMBECTOMY BRACHIAL ARTERY Left 11/20/2014   Procedure: 1.  Thrombectomy Left Axilo-Brachial Bypass  2.  Thromboendarterecotmy of Left Brachial Artery with Fogarty Thrombectomy of Radial and Ulnar Arteries with Dacron patch angioplasty Left Brachial Artery. 3. Intraoperative  Arteriogram times four.;  Surgeon: Mal Misty, MD;  Location: [place];  Service: Vascular;  Laterality: Left;  . TRANSTHORACIC ECHOCARDIOGRAM  11/25/2014   moderate concentric LVH, ef 60-65%, unable to evaluation LVDF due to atrial fib/  mild MR/  severe LAE  .  VEIN HARVEST Left 04/08/2014   Procedure: LEFT GREATER SAPHENOUS VEIN HARVEST;  Surgeon: Elam Dutch, MD;  Location: [place];  Service: Vascular;  Laterality: Left;  Marland Kitchen VEIN HARVEST Right 01/27/2015   Procedure: RIGHT GREATER SAPHENOUS VEIN HARVEST;  Surgeon: Elam Dutch, MD;  Location: [place];  Service: Vascular;  Laterality: Right;    ALLERGIES:  is allergic to oxycodone.   MEDICATIONS:  Current Facility-Administered Medications  Medication Dose Route Frequency Provider Last Rate Last Admin  .  stroke: mapping our early stages of recovery book   Does not apply Once Greta Doom, MD      . 0.9 %  sodium chloride infusion   Intravenous PRN Garvin Fila, MD 10 mL/hr at 02/01/19 0541 Rate Verify at 02/01/19 0541  . acetaminophen (TYLENOL) tablet 650 mg  650 mg Oral Q4H PRN Greta Doom, MD   650 mg at 01/26/19 2345   Or  . acetaminophen (TYLENOL) 160 MG/5ML solution 650 mg  650 mg Per Tube Q4H PRN Greta Doom, MD   650 mg at 01/31/19 1707   Or  . acetaminophen (TYLENOL) suppository 650 mg  650 mg Rectal Q4H PRN Greta Doom, MD      . amiodarone (PACERONE) tablet 200 mg  200 mg Per Tube  Daily Kipp Brood, MD   200 mg at 02/01/19 1006  . atorvastatin (LIPITOR) tablet 80 mg  80 mg Per Tube q1800 Rosalin Hawking, MD   80 mg at 02/01/19 1731  . chlorhexidine gluconate (MEDLINE KIT) (PERIDEX) 0.12 % solution 15 mL  15 mL Mouth Rinse BID Rush Farmer, MD   15 mL at 02/02/19 0841  . Chlorhexidine Gluconate Cloth 2 % PADS 6 each  6 each Topical Daily Rosalin Hawking, MD   6 each at 02/02/19 0115  . diltiazem (CARDIZEM) 10 mg/ml oral suspension 30 mg  30 mg Per Tube Q6H Kipp Brood, MD   30 mg at 02/02/19 0523  . feeding supplement (OSMOLITE 1.5 CAL) liquid 237 mL  237 mL Per Tube QID Rayburn, Kelly A, PA-C   237 mL at 02/02/19 0842  . feeding supplement (PRO-STAT SUGAR FREE 64) liquid 30 mL  30 mL Per Tube TID Rush Farmer, MD   30 mL at 02/01/19 2102  . fentaNYL (SUBLIMAZE) injection 25 mcg  25 mcg Intravenous Q15 min PRN Merlene Laughter F, NP   25 mcg at 01/27/19 1011  . fentaNYL (SUBLIMAZE) injection 25-100 mcg  25-100 mcg Intravenous Q30 min PRN Merlene Laughter F, NP   100 mcg at 01/28/19 3888  . free water 200 mL  200 mL Per Tube Q8H Spero Geralds, MD   200 mL at 02/02/19 0523  . heparin ADULT infusion 100 units/mL (25000 units/212m sodium chloride 0.45%)  2,100 Units/hr Intravenous Continuous OBertis Ruddy RPH 21 mL/hr at 02/02/19 0600 2,100 Units/hr at 02/02/19 0600  . insulin aspart (novoLOG) injection 0-15 Units  0-15 Units Subcutaneous Q4H DMerlene LaughterF, NP   2 Units at 02/01/19 2347  . MEDLINE mouth rinse  15 mL Mouth Rinse 10 times per day YRush Farmer MD   15 mL at 02/02/19 0524  . metoprolol tartrate (LOPRESSOR) 25 mg/10 mL oral suspension 100 mg  100 mg Per Tube BID DSpero Geralds MD   100 mg at 02/01/19 2103  . metoprolol tartrate (LOPRESSOR) injection 5 mg  5 mg Intravenous Q4H PRN DMerlene LaughterF, NP   5 mg at 02/02/19  0406  . nystatin (MYCOSTATIN) 100000 UNIT/ML suspension 500,000 Units  5 mL Oral TID AC & HS Merlene Laughter F, NP   500,000 Units at  02/02/19 [id number]  . pantoprazole sodium (PROTONIX) 40 mg/20 mL oral suspension 40 mg  40 mg Per Tube Daily Rosalin Hawking, MD   40 mg at 02/01/19 1006  . senna-docusate (Senokot-S) tablet 1 tablet  1 tablet Oral BID Bertis Ruddy, [facility]   Stopped at 02/01/19 2104  . vitamin B-12 (CYANOCOBALAMIN) tablet 1,000 mcg  1,000 mcg Per Tube Daily Rosalin Hawking, MD   1,000 mcg at 02/01/19 1006  . warfarin (COUMADIN) tablet 5 mg  5 mg Oral ONCE-1800 Bertis Ruddy, RPH      . Warfarin - Pharmacist Dosing Inpatient   Does not apply q1800 Bertis Ruddy, [facility]   Given at 02/01/19 1751    VITAL SIGNS: BP (!) 164/108 (BP Location: Right Arm)   Pulse (!) 58   Temp 99.7 F (37.6 C) (Axillary)   Resp 18   Ht 5' 8.5" (1.74 m)   Wt 84.5 kg   SpO2 95%   BMI 27.91 kg/m  Filed Weights   01/31/19 0500 02/01/19 0500 02/02/19 0408  Weight: 86 kg 87 kg 84.5 kg    Estimated body mass index is 27.91 kg/m as calculated from the following:   Height as of this encounter: 5' 8.5" (1.74 m).   Weight as of this encounter: 84.5 kg.  LABS: CBC:    Component Value Date/Time   WBC 16.9 (H) 02/02/2019 0317   HGB 12.2 (L) 02/02/2019 0317   HCT 37.2 (L) 02/02/2019 0317   PLT 750 (H) 02/02/2019 0317   Comprehensive Metabolic Panel:    Component Value Date/Time   NA 138 02/02/2019 0317   K 3.7 02/02/2019 0317   CO2 25 02/02/2019 0317   BUN 19 02/02/2019 0317   CREATININE 0.62 02/02/2019 0317   CREATININE 1.23 08/07/2014 0913   ALBUMIN 3.7 01/18/2019 1923     Review of Systems  Unable to perform ROS: Intubated   Physical Exam General: critically ill, frail appearing Cardiovascular: irregularly irregular Pulmonary: tracheostomy/vent support  Neurological: intubated, will not follow commands   Prognosis: Poor   Discharge Planning:  To Be Determined  Recommendations:  Full Code/Full Scope/Aggressive care per brother  Continue with current plan of care per medical team  Brother is adamant that he will  not transition to comfort or consider any care not focused on aggressive and improvement. States he does not want to discuss any further. Continues to insist medical team seek transfer to [facility] with awareness it is their medical opinion to accept or decline patient as transfer. Currently has declined with no additional interventions available. Brother is focused on God healing and recovering patient based on his previous experience. States he is in agreement with any and all interventions at this point.   Appreciative of Palliative's support however, has made it clear he does not wish to continue with discussions.   PMT will continue to follow patient peripherally. If a need to further engage arise please don't hesitate to reach out. At this time brother is not interested in continued involvement or discussions.    Palliative Performance Scale: INTUBATED              Brother ([name]) expressed understanding and was in agreement with this plan.   Thank you for allowing the Palliative Medicine Team to assist in the care of this patient.  Time In: 0900  Time Out: 0955 Time Total: 55 min.   Visit consisted of counseling and education dealing with the complex and emotionally intense issues of symptom management and palliative care in the setting of serious and potentially life-threatening illness.Greater than 50%  of this time was spent counseling and coordinating care related to the above assessment and plan.  Signed by:  Alda Lea, AGPCNP-BC Palliative Medicine Team  Phone: [phone] Amion: Bjorn Pippin

## 2019-02-02 NOTE — Op Note (Signed)
[facility] Patient Name: [name] Procedure Date : 01/30/2019 MRN: [id number] Attending MD: Jesusita Oka ,  Date of Birth: 08/25/1950 CSN: [id number] Age: 68 Admit Type: Inpatient Procedure:                Upper GI endoscopy Indications:              Dysphagia, Place PEG because patient requires                            ventilator support, Place PEG to improve nutrition                            in patient with prolonged severe illness Providers:                Dorene Sorrow, Technician, Lina Sar, Technician Referring MD:              Medicines:                Fentanyl 50 micrograms IV, Midazolam 2 mg IV Complications:            No immediate complications. Estimated blood loss:                            Minimal. Estimated Blood Loss:     Estimated blood loss was minimal. Procedure:                Pre-Anesthesia Assessment:                           - After reviewing the risks and benefits, the                            patient was deemed in satisfactory condition to                            undergo the procedure.                           - The anesthesia plan was to use moderate                            sedation/analgesia (conscious sedation).                           - The heart rate, respiratory rate, oxygen                            saturations, blood pressure, adequacy of pulmonary                            ventilation, and response to care were monitored                            throughout the procedure.  After obtaining informed consent, the endoscope was                            passed under direct vision. Throughout the                            procedure, the patient's blood pressure, pulse, and                            oxygen saturations were monitored continuously. The                            [id number]) Olympus gastroscope was             introduced through the mouth, and advanced to the                            duodenal bulb. The patient tolerated the procedure                            well. The upper GI endoscopy was accomplished with                            ease. Scope In: Scope Out: Findings:      The esophagus was normal.      The stomach was normal.      The examined duodenum was normal. Impression:               - Normal esophagus.                           - Normal stomach.                           - Normal examined duodenum.                           - No specimens collected. Moderate Sedation:      Moderate (conscious) sedation was personally administered by the       endoscopist. The following parameters were monitored: oxygen saturation,       heart rate, blood pressure, respiratory rate, EKG, adequacy of pulmonary       ventilation, and response to care. Recommendation:           - Please follow the post-PEG recommendations                            including: May use PEG for meds/water flushes                            immediately and feeds 4 hours post procedure. May                            restart anticoagulation 6 hours post-procedure.                            Apply abdominal binder.  If PEG inadvertently                            removed with 7 days, please call Surgery                            immediately as this is a surgical emergency. Procedure Code(s):        --- Professional ---                           [phone], Esophagogastroduodenoscopy, flexible,                            transoral; diagnostic, including collection of                            specimen(s) by brushing or washing, when performed                            (separate procedure) Diagnosis Code(s):        --- Professional ---                           R13.10, Dysphagia, unspecified                           Z99.11, Dependence on respirator [ventilator] status CPT copyright 2019 American Medical  Association. All rights reserved. The codes documented in this report are preliminary and upon coder review may  be revised to meet current compliance requirements. Jesusita Oka,  02/02/2019 11:50:48 AM Number of Addenda: 0

## 2019-02-02 NOTE — Progress Notes (Signed)
STROKE TEAM PROGRESS NOTE   INTERVAL HISTORY Patient remains on ventilatory support for his respiratory failure and sedated.  Attempt to wean him this morning led to tachypnea and tachycardia and is back on full ventilatory support.   He remains aphasic and not following any commands consistently.  He remains on on Abx and heparin IV.  Marland Kitchen He opens his eyes partially and will gaze but will not follow commands consistently.  Remains globally aphasic with dense right hemiplegia we will move left side semipurposeful OBJECTIVE Vitals:   02/02/19 0900 02/02/19 1000 02/02/19 1005 02/02/19 1100  BP: (!) 166/103 (!) 145/86 (!) 145/86 132/86  Pulse: 84 (!) 117 (!) 110 86  Resp: 18 (!) 25  (!) 27  Temp:      TempSrc:      SpO2: 96% 96%  99%  Weight:      Height:        CBC:  Recent Labs  Lab 02/01/19 0253 02/02/19 0317  WBC 15.6* 16.9*  HGB 11.6* 12.2*  HCT 35.0* 37.2*  MCV 103.9* 105.4*  PLT 680* 750*    Basic Metabolic Panel:  Recent Labs  Lab 02/01/19 0253 02/02/19 0317  NA 140 138  K 3.7 3.7  CL 105 103  CO2 27 25  GLUCOSE 93 95  BUN 19 19  CREATININE 0.63 0.62  CALCIUM 8.2* 8.4*  MG 2.1 2.1  PHOS 2.8 2.8    Lipid Panel:     Component Value Date/Time   CHOL 238 (H) 01/19/2019 0437   TRIG 64 01/19/2019 0437   HDL 43 01/19/2019 0437   CHOLHDL 5.5 01/19/2019 0437   VLDL 13 01/19/2019 0437   LDLCALC 182 (H) 01/19/2019 0437   HgbA1c:  Lab Results  Component Value Date   HGBA1C 5.1 01/19/2019   Urine Drug Screen:     Component Value Date/Time   LABOPIA NONE DETECTED 07/13/2016 1428   COCAINSCRNUR NONE DETECTED 07/13/2016 1428   LABBENZ POSITIVE (A) 07/13/2016 1428   AMPHETMU NONE DETECTED 07/13/2016 1428   THCU POSITIVE (A) 07/13/2016 1428   LABBARB NONE DETECTED 07/13/2016 1428    Alcohol Level     Component Value Date/Time   ETH <5 02/09/2015 0010    IMAGING  CT HEAD WO CONTRAST 01/24/2019 IMPRESSION: Involving acute/subacute infarcts in the left  frontal parietal lobe and left occipital lobe. Small amount of hemorrhage in the left posterior frontal infarct unchanged from prior CT and MRI. Electronically Signed   By: Franchot Gallo M.D.   On: 01/24/2019 16:18   MRI Wo Contrast  01/20/2019 IMPRESSION: 1. Multifocal acute ischemia within the left MCA territory, including the left postcentral gyrus. No hemorrhage or mass effect. 2. 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 3. Chronic ischemic microangiopathy.  CT Head Wo Contrast  01/19/2019 IMPRESSION: Interval demarcation of an acute cortically based infarct within the lateral left frontal lobe as described. No acute intracranial hemorrhage or significant mass effect.  CT Code Stroke CTA Head W/WO contrast CT Code Stroke CTA Neck W/WO contrast 01/18/2019 IMPRESSION:   Atherosclerotic disease at both carotid bifurcations but no stenosis.   Prominent soft plaque at the left ICA bulb.   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   Dominant right vertebral artery widely patent.   No intracranial large or medium vessel occlusion or correctable proximal stenosis, with specific attention to the left MCA branches.   I do think there are a few distal  MCA branches that lose opacification and there could be a few small emboli in the left MCA territory. Vessels marked with arrows.   Distal vessel atherosclerotic irregularity, particularly evident in the PCA branches.   Aortic Atherosclerosis (ICD10-I70.0)   Emphysema (ICD10-J43.9).   CT HEAD CODE STROKE WO CONTRAST 01/18/2019  IMPRESSION: 1. No acute finding by CT. Chronic small-vessel ischemic changes of the white matter. Old right cerebellar and right occipital infarctions.  2. ASPECTS is 10   Transthoracic Echocardiogram  01/19/2019 IMPRESSIONS  1. Left ventricular ejection fraction, by visual estimation, is 55 to 60%. The left ventricle has normal  function. There is moderately increased left ventricular hypertrophy.  2. The left ventricle demonstrates regional wall motion abnormalities.  3. Global right ventricle has normal systolic function.The right ventricular size is normal. No increase in right ventricular wall thickness.  4. Left atrial size was normal.  5. Right atrial size was mildly dilated.  6. The mitral valve is normal in structure. Mild to moderate mitral valve regurgitation.  7. The tricuspid valve is normal in structure. Tricuspid valve regurgitation is trivial.  8. The aortic valve is normal in structure. Aortic valve regurgitation is not visualized.  9. The pulmonic valve was normal in structure. Pulmonic valve regurgitation is not visualized. 10. The atrial septum is grossly normal.  ECG atrial fibrillation - ventricular response 133 BPM (See cardiology reading for complete details)  PHYSICAL EXAM  Blood pressure 132/86, pulse 86, temperature 99.7 F (37.6 C), temperature source Axillary, resp. rate (!) 27, height 5' 8.5" (1.74 m), weight 84.5 kg, SpO2 99 %.  General -frail elderly Caucasian male, intubated on sedation  Ophthalmologic - fundi not visualized due to noncooperation.  Cardiovascular - irregularly irregular heart rate and rhythm.  Neuro - intubated  on sedation, eyes closed, not following commands.  Globally aphasic.  Barely open eyes with pain stimulation.ly, eyes in mid position, not blinking to visual threat bilaterally, not tracking, pupil bilaterally 2 mm, sluggish to light. Doll's eyes sluggish, corneal reflex weakly present L>R, gag and cough present.  Breathing over the vent.  Facial symmetry not able to test due to ET tube. On pain stimulation, no movement of right extremity but has some tone and movement in the left upper extremity, slight toe and leg movement at BLEs. Sensation, coordination and gait not tested.   ASSESSMENT/PLAN [name] is a 68 y.o. male with history of  tobacco use, previous strokes, COPD, AAA, ASPVD, htn, hld, pulmonary nodule, depression, atrial fibrillation on coumadin but recently missed doses presenting with transient aphasia and leaning to the right. The patient received IV t-PA Thursday 01/18/19 @ 2000.  Stroke - left MCA infarct s/p t-PA - embolic pattern, likely due to atrial fibrillation with subtherapeutic INR  Resultant global aphasia, left gaze, right hemiparesis, right neglect and agitation  Code Stroke CT Head - No acute finding by CT. Chronic small-vessel ischemic changes of the white matter. Old right cerebellar and right occipital infarctions. ASPECTS is 10   CT head - Interval demarcation of an acute cortically based infarct within the lateral left frontal lobe as described. No acute intracranial hemorrhage or significant mass effect.  MRI head -  Multifocal acute ischemia within the left MCA territory, including the left postcentral gyrus. No hemorrhage or mass effect. 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 Chronic ischemic microangiopathy.  CTA H&N - Prominent soft plaque at the left ICA bulb. Left vertebral artery occluded at its origin and reconstituted in the distal  cervical region by collaterals.   CT 12/16 stable, continued evolution of left MCA infarcts  2D Echo - EF 55 to 60%. No cardiac source of emboli identified.   [employer] Virus 2 - negative  LDL - 182  HgbA1c - 5.1  VTE prophylaxis -Heparin subq  warfarin daily prior to admission, now on heparin IV  Therapy recommendations:  Signed off, reorder once off vent  Disposition:  Pending  Afib with RVR  On amiodarone and Cardizem drip  Still has RVR due to agitation  On lopressor 25 bid (on 100 bid at home) increase as needed    On warfarin PTA, INR 1.1 on admission INR 1.2 on 02/02/2019  On aspirin for now  On heparin IV  AMS with agitation, chronic benzo use Respiratory failure with hypoxia  Intubated   Weaned off precedex and  fentanyl and versed  On resperidone 1mg  bid, klonopin 0.5 bid, oxycodone 5 mb q6h  CXR Increased right pleural effusion with associated right basilar atelectasis or infiltrate  Tachypnea on SBT received bolus sedation  CCM on board  Extubate as able  B12 deficiency  Vitamin B12 = 161  B12 supplement  History of stroke  11/2014, bilateral blurry vision after left upper extremity bypass surgery.  Found to have left central vision loss.  MRI showed bilateral embolic infarcts.  INR 1.45.  Carotid Doppler negative.  TTE unremarkable.  LDL 82, A1c 5.4.  Continued on discharge with Coumadin and Lipitor 40  hypotension Hx of hypertension  Home BP meds: Diltiazem, Lasix and metoprolol  Current BP meds clonidine 0.3 q 6h , metoprolol 25 bid  BP stable  Off neo . Avoid low BP  Hyperlipidemia  Home Lipid lowering medication: Lipitor 40 mg daily  LDL 182, goal < 70  Current lipid lowering medication: Lipitor 80 mg daily   Continue statin at discharge  Dysphagia  N.p.o.  Speech to follow once extubated  P.o. meds through tube  On bolus feed  VAP Fever and leukocytosis  T-max 101.7->100.2->100.4->101.5->99.6  leucocytosis - 11.1->11.7->11.0->14.0->13.4->12.6->10.0->13.4->7.8->9.0  CXR with increased R pleural effusion w/ R basilar atx/infiltrate  UA neg 01/18/19  On cefepime (started 01/26/19 x 7 days) for S.pneumoniae and E.cloacae in sputum  CCM on board  Tobacco abuse  Current smoker  Smoking cessation counseling will be provided  Pt is willing to quit  Other Stroke Risk Factors  Advanced age  ETOH use, advised to drink no more than 1 alcoholic beverage per day.  Other Active Problems  Hypokalemia 3.3->3.4->3.3->3.1->4.0->4.3->4.4->4.4->4.0->3.4->3.7  Oral thrush on nystatin  Mild Anemia - Hb - 11.9   Hospital day # 15 Start started warfarin since he now has a PEG but will continue IV heparin till INR is therapeutic.  Patient prognosis  remains poor given his significant aphasia and hemiparesis , pneumonia and respiratory failure with difficulty in weaning  heis unlikely to survive without prolong feeding tube and nursing care.  I I called the patient's brother today on his listed cell phone but was unable to leave a message and he did not pick up.  The same thing happened yesterday evening when I called him after speaking to [facility] and transfer was denied but I was unable to leave a message.  The patient's brother wants to continue present treatment.. Brother however seems unrealistic and when asked to come to visit his brother he has declined this till after the holidays.  Iscussed with Dr.Yacoub critical care medicine.  No family available at the bedside for  discussion. This patient is critically ill and at significant risk of neurological worsening, death and care requires constant monitoring of vital signs, hemodynamics,respiratory and cardiac monitoring, extensive review of multiple databases, frequent neurological assessment, discussion with family, other specialists and medical decision making of high complexity. I spent 35 minutes of neurocritical care time  in the care of  this patient.    [name] Contras, MD To contact Stroke Continuity provider, please refer to [email or web address] After hours, contact General Neurology  ADDEDNDUM : I had telephonic discussion with Dr. Radene Knee neuro critical care fellow on-call at [facility] about potential transfer upon family request of this patient to [facility] after discussion about the case Dr. Radene Knee declined transfer stating there is not anything more that [facility] had to offer this patient.  I informed the patient's brother about this. [name] Contras, MD Medical Director [facility] Pager: [phone] 02/02/2019 12:02 PM

## 2019-02-02 NOTE — Progress Notes (Signed)
  ANTICOAGULATION CONSULT NOTE - Follow-Up  Pharmacy Consult for Heparin + coumadin Indication: atrial fibrillation and stroke  Allergies  Allergen Reactions  . Oxycodone Itching    Patient Measurements: Height: 5' 8.5" (174 cm) Weight: 186 lb 4.6 oz (84.5 kg) IBW/kg (Calculated) : 69.55  Ideal body weight: 69.6 kg (153 lb 5.3 oz) Adjusted ideal body weight: 75.5 kg (166 lb 8.2 oz)  Heparin Dosing Weight: 84.2 kg  Vital Signs: Temp: 98.6 F (37 C) (12/25 0341) Temp Source: Oral (12/25 0341) BP: 150/114 (12/25 0600) Pulse Rate: 107 (12/25 0600)  Labs: Recent Labs    01/31/19 0345 01/31/19 1839 02/01/19 0253 02/02/19 0317 02/02/19 0318  HGB 12.7*  --  11.6* 12.2*  --   HCT 38.5*  --  35.0* 37.2*  --   PLT 543*  --  680* 750*  --   LABPROT  --   --   --  14.6  --   INR  --   --   --  1.2  --   HEPARINUNFRC 0.15* 0.32 0.30  --  0.42  CREATININE 0.84  --  0.63 0.62  --     Estimated Creatinine Clearance: 93.2 mL/min (by C-G formula based on SCr of 0.62 mg/dL).  Assessment: 68 yr old male off warfarin PTA for history of Afib presented on 12/10 with an acute stroke. The patient required intubation, so pharmacy was consulted to dose  heparin for anticoagulation.   S/P PEG placement, heparin infusion restarted 12/22 PM. PTA warfarin dosing: 5mg  daily  Heparin level therapeutic this AM, INR subtherapeutic at 1.2 s/p restart yesterday.  CBC stable.    Goal of Therapy:  Heparin level 0.3 - 0.5 units/ml; no boluses INR 2-3 Monitor platelets by anticoagulation protocol: Yes   Plan:  Continue heparin gtt at 2100 units/hr Warfarin 5mg  PO x 1 tonight Daily INR, heparin level, CBC, s/s bleeding  Bertis Ruddy, PharmD Clinical Pharmacist Please check AMION for all [place] numbers 02/02/2019 7:06 AM

## 2019-02-02 NOTE — Progress Notes (Addendum)
NAME:  [name], MRN:  [id number], DOB:  May 04, 1950, LOS: 77 ADMISSION DATE:  01/18/2019, CONSULTATION DATE:  01/18/19 REFERRING MD:  Leonel Ramsay, CHIEF COMPLAINT:  Unable to elicit   Brief History   68yM with hypertensive emergency in setting of acute L MCA stroke s/p tPA, AF/RVR.  Has been noncompliant with his warfarin for A. Fib. Code stroke activated, TPA administered. Intubated for respiratory failure. PCCM consulted for vent management  Significant Hospital Events   12/10 tPA administered at 1946 12/11 intubated  Consults:  PCCM  Procedures:  None  Significant Diagnostic Tests:  CTA h/n 12/10 > distal MCA branches that lose opacification and there could be a few small emboli in the left MCA territory.  CT head 12/11 > Interval demarcation of an acute cortically based infarct within the lateral left frontal lobe as described. No acute intracranial hemorrhage or significant mass effect.  MRI brain 12/12 >  1. Multifocal acute ischemia within the left MCA territory, including the left postcentral gyrus. No hemorrhage or mass effect. 2. 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 3. Chronic ischemic microangiopathy.  Micro Data:  Blood 12/10 > NGTD Urine 12/10 > insignificant growth  MRSA surveillance 12/11 > negative  SARS Coronavirus 2 12/11 > negative  Antimicrobials:  Cefepime 12/18>25 (7 day course)   Interim history/subjective:  [facility] has declined transfer. Dr. Leonie Man has updated the patient's brother.  NAEO  This morning, failed PS trial with somewhat increased RR (28-35) and low tidals (250-280)   Objective   Blood pressure (!) 150/114, pulse (!) 107, temperature 99.7 F (37.6 C), temperature source Axillary, resp. rate (!) 21, height 5' 8.5" (1.74 m), weight 84.5 kg, SpO2 95 %.    Vent Mode: PRVC FiO2 (%):  [40 %-60 %] 40 % Set Rate:  [12 bmp] 12 bmp Vt Set:  [540 mL] 540 mL PEEP:  [5 cmH20] 5 cmH20 Plateau Pressure:  [16 cmH20-25 cmH20] 18  cmH20   Intake/Output Summary (Last 24 hours) at 02/02/2019 [id number] Last data filed at 02/02/2019 0600 Gross per 24 hour  Intake 2059.2 ml  Output 1540 ml  Net 519.2 ml   Filed Weights   01/31/19 0500 02/01/19 0500 02/02/19 0408  Weight: 86 kg 87 kg 84.5 kg   Examination: General: Chronically ill appearing older adult M, on trach and vent NAD  HEENT: NCAT. Thin white/tan coating on tongue. Trach secure. Anicteric sclera  Neuro: L pupil 39mm R pupil 27mm. Moves L fingers and LLE spontaneously. Grimaces to pain. Does not follow command  CV: irir, s1s2 no rgm. Cap refill < 3 seconds  PULM: Course with scattered rhonchi bilaterally. No wheezing no crackles.  GI: soft ndnt, + bowel sounds Extremities: Trace lower extremity edema. LUE dependent edema No cyanosis or clubbing  Skin: c/d/w/i without   Resolved Hospital Problem list   Hypertensive emergency   Assessment & Plan:  Acute stroke with distal branch occlusions of left MCA -Status post tPA Plan: Per neurology  Family has requested T to [facility], [facility] has declined   Acute respiratory failure due to CVA, s/p tracheostomy  -tracheostomy 12/21 by PCCM  Plan: PS trials as tolerated with goal to wean to TC. Failed PS 12/25 with tachypnea and low Vt  Continue pulm hygiene, VAP bundle PRN CXR   Acute Encephalopathy -likely multifactorial-- CVA as above, CNS depressing medications, possible delirium, substance use hx  Plan: Minimize sedating medications as able Delirium precautions   VAP - 12/15 with worsening right sided opacities  on CXR sputum culture 12/16 with abundant gram positive cocci.  -s/p 7 day course Cefepime  Plan:  VAP prevention bundle    Atrial Fib with RVR  -now rate contolled Plan: Continue tele Continue PO dilt, lopressor, amiodarone IV heparin; bridging to warfarin   Oral thrush  Plan: Continue nystatin suspension Oral Care   Hypernatremia, resolved   Plan: Continue free water  Inadequate oral  intake, Dysphagia in setting of CVA, s/p PEG -PEG 12/22 with CCS Plan: EN via PEG   Best practice:  Diet:EN via PEG  Pain/Anxiety/Delirium protocol (if indicated): fentanyl  VAP protocol (if indicated): In place DVT prophylaxis: IV heparin, SCD  GI prophylaxis: PPI  Glucose control: has not required coverage. D/c accu checks. Hypoglycemia protocol  Mobility: Bedrest Code Status: Full code Family Communication:  Disposition:  ICU  Labs and imaging for past 48 hours reviewed in EMR    CRITICAL CARE Performed by: Cristal Generous   Total critical care time: 35 minutes  Critical care time was exclusive of separately billable procedures and treating other patients. Critical care was necessary to treat or prevent imminent or life-threatening deterioration.  Critical care was time spent personally by me on the following activities: development of treatment plan with patient and/or surrogate as well as nursing, discussions with consultants, evaluation of patient's response to treatment, examination of patient, obtaining history from patient or surrogate, ordering and performing treatments and interventions, ordering and review of laboratory studies, ordering and review of radiographic studies, pulse oximetry and re-evaluation of patient's condition.  Attending Note:  68 year old male with extensive PMH s/p L MCA CVA who is unresponsive.  Patient failed weaning overnight, remains unresponsive.  On exam, unresponsive and not following commands but moving left spontaneously.  I reviewed CXR myself, trach is in a good position.  Discussed with PCCM-NP.  Will continue full vent support.  Patient was rejected from [facility] for transfer.  Brother continues to insist on transfer.  Will defer further communications at this point to neurology.  The patient is critically ill with multiple organ systems failure and requires high complexity decision making for assessment and support, frequent evaluation and  titration of therapies, application of advanced monitoring technologies and extensive interpretation of multiple databases.   Critical Care Time devoted to patient care services described in this note is  31  Minutes. This time reflects time of care of this signee Dr [name]. This critical care time does not reflect procedure time, or teaching time or supervisory time of PA/NP/Med student/Med Resident etc but could involve care discussion time.  Rush Farmer, M.D. [facility] Pulmonary/Critical Care Medicine.

## 2019-02-03 DIAGNOSIS — R1312 Dysphagia, oropharyngeal phase: Secondary | ICD-10-CM

## 2019-02-03 DIAGNOSIS — T17908D Unspecified foreign body in respiratory tract, part unspecified causing other injury, subsequent encounter: Secondary | ICD-10-CM

## 2019-02-03 DIAGNOSIS — E43 Unspecified severe protein-calorie malnutrition: Secondary | ICD-10-CM

## 2019-02-03 LAB — GLUCOSE, CAPILLARY
Glucose-Capillary: 113 mg/dL — ABNORMAL HIGH (ref 70–99)
Glucose-Capillary: 120 mg/dL — ABNORMAL HIGH (ref 70–99)
Glucose-Capillary: 122 mg/dL — ABNORMAL HIGH (ref 70–99)
Glucose-Capillary: 153 mg/dL — ABNORMAL HIGH (ref 70–99)
Glucose-Capillary: 89 mg/dL (ref 70–99)
Glucose-Capillary: 92 mg/dL (ref 70–99)

## 2019-02-03 LAB — BASIC METABOLIC PANEL
Anion gap: 8 (ref 5–15)
BUN: 17 mg/dL (ref 8–23)
CO2: 27 mmol/L (ref 22–32)
Calcium: 8.2 mg/dL — ABNORMAL LOW (ref 8.9–10.3)
Chloride: 104 mmol/L (ref 98–111)
Creatinine, Ser: 0.61 mg/dL (ref 0.61–1.24)
GFR calc Af Amer: >60 mL/min (ref >60)
GFR calc non Af Amer: >60 mL/min (ref >60)
Glucose, Bld: 92 mg/dL (ref 70–99)
Potassium: 3.5 mmol/L (ref 3.5–5.1)
Sodium: 139 mmol/L (ref 135–145)

## 2019-02-03 LAB — PROTIME-INR
INR: 1.2 (ref 0.8–1.2)
Prothrombin Time: 15 seconds (ref 11.4–15.2)

## 2019-02-03 LAB — CBC
HCT: 35.3 % — ABNORMAL LOW (ref 39.0–52.0)
Hemoglobin: 11.5 g/dL — ABNORMAL LOW (ref 13.0–17.0)
MCH: 33.9 pg (ref 26.0–34.0)
MCHC: 32.6 g/dL (ref 30.0–36.0)
MCV: 104.1 fL — ABNORMAL HIGH (ref 80.0–100.0)
Platelets: 828 10*3/uL — ABNORMAL HIGH (ref 150–400)
RBC: 3.39 MIL/uL — ABNORMAL LOW (ref 4.22–5.81)
RDW: 14.9 % (ref 11.5–15.5)
WBC: 15.6 10*3/uL — ABNORMAL HIGH (ref 4.0–10.5)
nRBC: 0 % (ref 0.0–0.2)

## 2019-02-03 LAB — HEPARIN LEVEL (UNFRACTIONATED): Heparin Unfractionated: 0.43 IU/mL (ref 0.30–0.70)

## 2019-02-03 MED ORDER — APIXABAN 5 MG PO TABS: 5.0000 mg | ORAL_TABLET | Freq: Two times a day (BID) | ORAL | Status: DC

## 2019-02-03 MED ORDER — APIXABAN 5 MG PO TABS
5.0000 mg | ORAL_TABLET | Freq: Two times a day (BID) | ORAL | Status: DC
  Administered 2019-02-03 – 2019-03-03 (×56): 5 mg
  Filled 2019-02-03 (×57): qty 1

## 2019-02-03 MED ORDER — WARFARIN SODIUM 7.5 MG PO TABS
7.5000 mg | ORAL_TABLET | Freq: Once | ORAL | Status: DC
  Filled 2019-02-03: qty 1

## 2019-02-03 NOTE — Progress Notes (Signed)
NAME:  [name], MRN:  [id number], DOB:  04/03/1950, LOS: 77 ADMISSION DATE:  01/18/2019, CONSULTATION DATE:  01/18/19 REFERRING MD:  Leonel Ramsay, CHIEF COMPLAINT:  Unable to elicit   Brief History   4yM with hypertensive emergency in setting of acute L MCA stroke s/p tPA, AF/RVR.  Has been noncompliant with his warfarin for A. Fib. Code stroke activated, TPA administered. Intubated for respiratory failure. PCCM consulted for vent management  Significant Hospital Events   12/10 tPA administered at 1946 12/11 intubated  Consults:  PCCM  Procedures:  None  Significant Diagnostic Tests:  CTA h/n 12/10 > distal MCA branches that lose opacification and there could be a few small emboli in the left MCA territory.  CT head 12/11 > Interval demarcation of an acute cortically based infarct within the lateral left frontal lobe as described. No acute intracranial hemorrhage or significant mass effect.  MRI brain 12/12 >  1. Multifocal acute ischemia within the left MCA territory, including the left postcentral gyrus. No hemorrhage or mass effect. 2. 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 3. Chronic ischemic microangiopathy.  Micro Data:  Blood 12/10 > NGTD Urine 12/10 > insignificant growth  MRSA surveillance 12/11 > negative  SARS Coronavirus 2 12/11 > negative  Antimicrobials:  Cefepime 12/18>25 (7 day course)   Interim history/subjective:  Failed weaning this AM Attempted 5/5 again while bedside and seems to tolerate it better   Objective   Blood pressure (!) 169/105, pulse (!) 110, temperature 98.4 F (36.9 C), temperature source Axillary, resp. rate (!) 28, height 5' 8.5" (1.74 m), weight 82.9 kg, SpO2 100 %.    Vent Mode: PRVC FiO2 (%):  [40 %] 40 % Set Rate:  [12 bmp] 12 bmp Vt Set:  [540 mL] 540 mL PEEP:  [5 cmH20] 5 cmH20 Pressure Support:  [10 cmH20] 10 cmH20 Plateau Pressure:  [17 cmH20-26 cmH20] 17 cmH20   Intake/Output Summary (Last 24 hours)  at 02/03/2019 1005 Last data filed at 02/03/2019 [id number] Gross per 24 hour  Intake 2682.62 ml  Output 1650 ml  Net 1032.62 ml   Filed Weights   02/01/19 0500 02/02/19 0408 02/03/19 0500  Weight: 87 kg 84.5 kg 82.9 kg   Examination: General: Chronically ill appearing male, trached, NAD HEENT: [place]/AT, PERRL, EOM-I and MMM, trach is in a good position Neuro: L pupil 10mm R pupil 52mm. Moves L fingers and LLE spontaneously. Grimaces to pain. Does not follow command  CV: IRIR, Nl S1/S2 and -M/R/G PULM: Coarse BS diffusely GI: Soft, NT, ND and +BS Extremities: Trace lower extremity edema. LUE dependent edema No cyanosis or clubbing  Skin: c/d/w/i without   I reviewed CXR myself, trach is in a good position  Discussed with RT  Resolved Hospital Problem list   Hypertensive emergency   Assessment & Plan:  Acute stroke with distal branch occlusions of left MCA -Status post tPA Plan: Per neurology  No transfer Placement in a vent facility recommended unless able to wean off which I am optimistic for today  Acute respiratory failure due to CVA, s/p tracheostomy  -tracheostomy 12/21 by PCCM  Plan: PT trials today, tolerate a higher respiratory rate given neuro injury as long as not desaturating or mental status changes Continue pulm hygiene, VAP bundle PRN CXR  Titrate O2 for sat of 88-92%  Acute Encephalopathy -likely multifactorial-- CVA as above, CNS depressing medications, possible delirium, substance use hx  Plan: D/C sedation Delirium precautions   VAP - 12/15 with worsening  right sided opacities on CXR sputum culture 12/16 with abundant gram positive cocci.  -s/p 7 day course Cefepime  Plan:  VAP prevention bundle  Atrial Fib with RVR  -now rate contolled Plan: Continue tele Continue PO dilt, lopressor, amiodarone Transition to coumadin  Oral thrush  Plan: Continue nystatin suspension Oral Care   Hypernatremia, resolved   Plan: Continue free  water  Inadequate oral intake, Dysphagia in setting of CVA, s/p PEG -PEG 12/22 with CCS Plan: EN via PEG   Consult CSW for placement and continue weaning  Best practice:  Diet:EN via PEG  Pain/Anxiety/Delirium protocol (if indicated): fentanyl  VAP protocol (if indicated): In place DVT prophylaxis: IV heparin, SCD  GI prophylaxis: PPI  Glucose control: has not required coverage. D/c accu checks. Hypoglycemia protocol  Mobility: Bedrest Code Status: Full code Family Communication:  Disposition:  ICU  Labs and imaging for past 48 hours reviewed in EMR    The patient is critically ill with multiple organ systems failure and requires high complexity decision making for assessment and support, frequent evaluation and titration of therapies, application of advanced monitoring technologies and extensive interpretation of multiple databases.   Critical Care Time devoted to patient care services described in this note is  31  Minutes. This time reflects time of care of this signee Dr [name]. This critical care time does not reflect procedure time, or teaching time or supervisory time of PA/NP/Med student/Med Resident etc but could involve care discussion time.  Rush Farmer, M.D. [employer] Pulmonary/Critical Care Medicine.

## 2019-02-03 NOTE — Progress Notes (Addendum)
  ANTICOAGULATION CONSULT NOTE - Follow-Up  Pharmacy Consult for Heparin + coumadin Indication: atrial fibrillation and stroke  Allergies  Allergen Reactions  . Oxycodone Itching    Patient Measurements: Height: 5' 8.5" (174 cm) Weight: 182 lb 12.2 oz (82.9 kg) IBW/kg (Calculated) : 69.55  Ideal body weight: 69.6 kg (153 lb 5.3 oz) Adjusted ideal body weight: 74.9 kg (165 lb 1.6 oz)  Heparin Dosing Weight: 84.2 kg  Vital Signs: Temp: 98.4 F (36.9 C) (12/26 0700) Temp Source: Axillary (12/26 0700) BP: 147/114 (12/26 0600) Pulse Rate: 112 (12/26 0600)  Labs: Recent Labs    02/01/19 0253 02/02/19 0317 02/02/19 0318 02/03/19 0349  HGB 11.6* 12.2*  --  11.5*  HCT 35.0* 37.2*  --  35.3*  PLT 680* 750*  --  828*  LABPROT  --  14.6  --  15.0  INR  --  1.2  --  1.2  HEPARINUNFRC 0.30  --  0.42 0.43  CREATININE 0.63 0.62  --  0.61    Estimated Creatinine Clearance: 85.8 mL/min (by C-G formula based on SCr of 0.61 mg/dL).  Assessment: 68 yr old male off warfarin PTA for history of Afib presented on 12/10 with an acute stroke    Patient is now s/p trach and PEG placement. Family requested transfer to [facility] but declined  PTA warfarin dosing: 5mg  daily  Heparin level therapeutic this AM, INR subtherapeutic at 1.2 s/p restart 12/24.  CBC stable.    Goal of Therapy:  Heparin level 0.3 - 0.5 units/ml; no boluses INR 2-3 Monitor platelets by anticoagulation protocol: Yes   Plan:  Dc heparin and warfarin Convert to apixaban 5 mg bid  Barth Kirks, PharmD, BCPS, BCCCP Clinical Pharmacist [phone]  Please check AMION for all [place] numbers  02/03/2019 7:39 AM

## 2019-02-03 NOTE — Progress Notes (Signed)
STROKE TEAM PROGRESS NOTE   INTERVAL HISTORY Patient RN at bedside. Pt now on weaning trials, so far tolerating well. His eyes open, but not following commands, still has left gaze preference and right neglect. Right UE flaccid. BLEs moving but seems weaker on the right. On heparin IV and coumadin. Discussed with Dr. Nelda Marseille and pharmacy, will switch to eliquis.   OBJECTIVE Vitals:   02/03/19 1200 02/03/19 1300 02/03/19 1400 02/03/19 1500  BP: (!) 145/89 (!) 128/91 125/85   Pulse: 82 78 67 80  Resp: 17 (!) 28 (!) 26 (!) 22  Temp:      TempSrc:      SpO2: 98% 98% 99%   Weight:      Height:        CBC:  Recent Labs  Lab 02/02/19 0317 02/03/19 0349  WBC 16.9* 15.6*  HGB 12.2* 11.5*  HCT 37.2* 35.3*  MCV 105.4* 104.1*  PLT 750* 828*    Basic Metabolic Panel:  Recent Labs  Lab 02/01/19 0253 02/02/19 0317 02/03/19 0349  NA 140 138 139  K 3.7 3.7 3.5  CL 105 103 104  CO2 27 25 27   GLUCOSE 93 95 92  BUN 19 19 17   CREATININE 0.63 0.62 0.61  CALCIUM 8.2* 8.4* 8.2*  MG 2.1 2.1  --   PHOS 2.8 2.8  --     Lipid Panel:     Component Value Date/Time   CHOL 238 (H) 01/19/2019 0437   TRIG 64 01/19/2019 0437   HDL 43 01/19/2019 0437   CHOLHDL 5.5 01/19/2019 0437   VLDL 13 01/19/2019 0437   LDLCALC 182 (H) 01/19/2019 0437   HgbA1c:  Lab Results  Component Value Date   HGBA1C 5.1 01/19/2019   Urine Drug Screen:     Component Value Date/Time   LABOPIA NONE DETECTED 07/13/2016 1428   COCAINSCRNUR NONE DETECTED 07/13/2016 1428   LABBENZ POSITIVE (A) 07/13/2016 1428   AMPHETMU NONE DETECTED 07/13/2016 1428   THCU POSITIVE (A) 07/13/2016 1428   LABBARB NONE DETECTED 07/13/2016 1428    Alcohol Level     Component Value Date/Time   ETH <5 02/09/2015 0010    IMAGING  CT HEAD WO CONTRAST 01/24/2019 IMPRESSION: Involving acute/subacute infarcts in the left frontal parietal lobe and left occipital lobe. Small amount of hemorrhage in the left posterior frontal  infarct unchanged from prior CT and MRI. Electronically Signed   By: Franchot Gallo M.D.   On: 01/24/2019 16:18   MRI Wo Contrast  01/20/2019 IMPRESSION: 1. Multifocal acute ischemia within the left MCA territory, including the left postcentral gyrus. No hemorrhage or mass effect. 2. 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 3. Chronic ischemic microangiopathy.  CT Head Wo Contrast  01/19/2019 IMPRESSION: Interval demarcation of an acute cortically based infarct within the lateral left frontal lobe as described. No acute intracranial hemorrhage or significant mass effect.  CT Code Stroke CTA Head W/WO contrast CT Code Stroke CTA Neck W/WO contrast 01/18/2019 IMPRESSION:  Atherosclerotic disease at both carotid bifurcations but no stenosis.  Prominent soft plaque at the left ICA bulb.  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  Dominant right vertebral artery widely patent.  No intracranial large or medium vessel occlusion or correctable proximal stenosis, with specific attention to the left MCA branches.  I do think there are a few distal MCA branches that lose opacification and there could be a few small emboli in the left  MCA territory. Vessels marked with arrows.  Distal vessel atherosclerotic irregularity, particularly evident in the PCA branches.  Aortic Atherosclerosis (ICD10-I70.0)  Emphysema (ICD10-J43.9).   CT HEAD CODE STROKE WO CONTRAST 01/18/2019  IMPRESSION: 1. No acute finding by CT. Chronic small-vessel ischemic changes of the white matter. Old right cerebellar and right occipital infarctions.  2. ASPECTS is 10   Transthoracic Echocardiogram  01/19/2019 IMPRESSIONS  1. Left ventricular ejection fraction, by visual estimation, is 55 to 60%. The left ventricle has normal function. There is moderately increased left ventricular hypertrophy.  2. The left ventricle demonstrates regional wall motion  abnormalities.  3. Global right ventricle has normal systolic function.The right ventricular size is normal. No increase in right ventricular wall thickness.  4. Left atrial size was normal.  5. Right atrial size was mildly dilated.  6. The mitral valve is normal in structure. Mild to moderate mitral valve regurgitation.  7. The tricuspid valve is normal in structure. Tricuspid valve regurgitation is trivial.  8. The aortic valve is normal in structure. Aortic valve regurgitation is not visualized.  9. The pulmonic valve was normal in structure. Pulmonic valve regurgitation is not visualized. 10. The atrial septum is grossly normal.  ECG atrial fibrillation - ventricular response 133 BPM (See cardiology reading for complete details)  PHYSICAL EXAM  Blood pressure 125/85, pulse 80, temperature 99.3 F (37.4 C), temperature source Axillary, resp. rate (!) 22, height 5' 8.5" (1.74 m), weight 82.9 kg, SpO2 99 %.  General -frail elderly Caucasian male, intubated on sedation  Ophthalmologic - fundi not visualized due to noncooperation.  Cardiovascular - irregularly irregular heart rate and rhythm.  Neuro - pt lying in bed, trach and PEG in place. Currently on weaning trial and tolerating well. He is lethargic but able to open eyes on voice. Not following commands. Eyes left gaze preference, barely cross midline. Inconsistently blinking to visual threat bilaterally. PERRL. Right facial droop. Tongue protrusion not cooperative. Left UE at least 4/5, purposeful movement. RUE flaccid. BLEs withdraw to pain, 3-/5 but seems left stronger than right. DTR 1+ and bilateral babinski. Sensation, coordination and gait not tested.   ASSESSMENT/PLAN [name] is a 68 y.o. male with history of tobacco use, previous strokes, COPD, AAA, ASPVD, htn, hld, pulmonary nodule, depression, atrial fibrillation on coumadin but recently missed doses presenting with transient aphasia and leaning to the right.  The patient received IV t-PA Thursday 01/18/19 @ 2000.  Stroke - left MCA infarct s/p t-PA - embolic pattern, likely due to atrial fibrillation with subtherapeutic INR  Resultant global aphasia, left gaze, right hemiparesis, right neglect and agitation  Code Stroke CT Head - No acute finding by CT. Chronic small-vessel ischemic changes of the white matter. Old right cerebellar and right occipital infarctions. ASPECTS is 10   CT head - Interval demarcation of an acute cortically based infarct within the lateral left frontal lobe as described. No acute intracranial hemorrhage or significant mass effect.  MRI head -  Multifocal acute ischemia within the left MCA territory, including the left postcentral gyrus. No hemorrhage or mass effect. 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 Chronic ischemic microangiopathy.  CTA H&N - Prominent soft plaque at the left ICA bulb. 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   CT 12/16 stable, continued evolution of left MCA infarcts  2D Echo - EF 55 to 60%. No cardiac source of emboli identified.   [employer] Virus 2 -  negative  LDL - 182  HgbA1c - 5.1  VTE prophylaxis -Heparin subq  warfarin daily prior to admission, now on heparin IV and coumadin. Will switch to DOAC such as eliquis today.   Therapy recommendations:  Signed off, reorder once off vent  Disposition:  Pending  Afib with RVR  On amiodarone and Cardizem po  On metoprolol 100 bid   On warfarin PTA, INR 1.1 on admission INR 1.2 on 02/02/2019  On heparin IV and coumadin  Switch to Eliquis today  Respiratory failure with hypoxia  Intubated -> s/p trach 12/21  Tachypnea on SBT received bolus sedation  Now back on vent -> SBT trial again  CCM on board  Off vent as able  B12 deficiency  Vitamin B12 = 161  B12 supplement  History of stroke  11/2014, bilateral blurry vision after left upper  extremity bypass surgery.  Found to have left central vision loss.  MRI showed bilateral embolic infarcts.  INR 1.45.  Carotid Doppler negative.  TTE unremarkable.  LDL 82, A1c 5.4.  Continued on discharge with Coumadin and Lipitor 40  hypotension Hx of hypertension  Home BP meds: Diltiazem, Lasix and metoprolol  Current BP meds clonidine 0.3 q 6h , metoprolol 25 bid  BP stable  Off neo . Avoid low BP  Hyperlipidemia  Home Lipid lowering medication: Lipitor 40 mg daily  LDL 182, goal < 70  Current lipid lowering medication: Lipitor 80 mg daily   Continue statin at discharge  Dysphagia  N.p.o.  On bolus TF now  S/p PEG 12/22  Speech following  On FW 200cc Q8h  VAP Fever and leukocytosis  T-max 101.7->100.2->100.4->101.5->99.6->99.3  Leucocytosis - 7.8->9.0->15.6->16.9->15.6  CXR pending  UA neg 01/18/19  On cefepime (started 01/26/19 x 7 days) for S.pneumoniae and E.cloacae in sputum ->now off all antibiotics  CCM on board  Tobacco abuse  Current smoker  Smoking cessation counseling will be provided  Pt is willing to quit  Other Stroke Risk Factors  Advanced age  ETOH use, advised to drink no more than 1 alcoholic beverage per day.  Other Active Problems  Hypokalemia 3.3->3.4->3.3->3.1->4.0->4.3->4.4->4.4->4.0->3.4->3.7->3.5  Oral thrush on nystatin  Thrombocytosis - 750->828  Palliative Care consult 12/25 -> brother wants aggressive care -> ([facility] contacted per family request by Dr Leonie Man but [facility] would not agree to accept transfer)   Hospital day # 16  This patient is critically ill and at significant risk of neurological worsening, death and care requires constant monitoring of vital signs, hemodynamics,respiratory and cardiac monitoring, extensive review of multiple databases, frequent neurological assessment, discussion with family, other specialists and medical decision making of high complexity. I spent 35 minutes of neurocritical care  time  in the care of  this patient.    Rosalin Hawking, MD PhD Stroke Neurology 02/03/2019 4:25 PM

## 2019-02-04 ENCOUNTER — Inpatient Hospital Stay (HOSPITAL_COMMUNITY): Payer: Medicare Other

## 2019-02-04 LAB — GLUCOSE, CAPILLARY
Glucose-Capillary: 120 mg/dL — ABNORMAL HIGH (ref 70–99)
Glucose-Capillary: 147 mg/dL — ABNORMAL HIGH (ref 70–99)
Glucose-Capillary: 163 mg/dL — ABNORMAL HIGH (ref 70–99)
Glucose-Capillary: 82 mg/dL (ref 70–99)
Glucose-Capillary: 88 mg/dL (ref 70–99)
Glucose-Capillary: 93 mg/dL (ref 70–99)

## 2019-02-04 LAB — CBC
HCT: 35 % — ABNORMAL LOW (ref 39.0–52.0)
Hemoglobin: 11.9 g/dL — ABNORMAL LOW (ref 13.0–17.0)
MCH: 34.8 pg — ABNORMAL HIGH (ref 26.0–34.0)
MCHC: 34 g/dL (ref 30.0–36.0)
MCV: 102.3 fL — ABNORMAL HIGH (ref 80.0–100.0)
Platelets: 843 10*3/uL — ABNORMAL HIGH (ref 150–400)
RBC: 3.42 MIL/uL — ABNORMAL LOW (ref 4.22–5.81)
RDW: 14.6 % (ref 11.5–15.5)
WBC: 13.2 10*3/uL — ABNORMAL HIGH (ref 4.0–10.5)
nRBC: 0 % (ref 0.0–0.2)

## 2019-02-04 MED ORDER — SENNOSIDES 8.8 MG/5ML PO SYRP
5.0000 mL | ORAL_SOLUTION | Freq: Two times a day (BID) | ORAL | Status: DC
  Administered 2019-02-07 – 2019-03-03 (×43): 5 mL
  Filled 2019-02-04 (×50): qty 5

## 2019-02-04 MED ORDER — FUROSEMIDE 10 MG/ML IJ SOLN
40.0000 mg | Freq: Four times a day (QID) | INTRAMUSCULAR | Status: AC
  Administered 2019-02-04 (×3): 40 mg via INTRAVENOUS
  Filled 2019-02-04 (×3): qty 4

## 2019-02-04 MED ORDER — POTASSIUM CHLORIDE 20 MEQ/15ML (10%) PO SOLN
40.0000 meq | Freq: Three times a day (TID) | ORAL | Status: AC
  Administered 2019-02-04 (×2): 40 meq
  Filled 2019-02-04 (×2): qty 30

## 2019-02-04 NOTE — Progress Notes (Signed)
STROKE TEAM PROGRESS NOTE   INTERVAL HISTORY Patient lying in bed, on trach collar, so far tolerating well. Still not following commands, but awake, eyes open and more interactive. Mouthing words but intelligible. Moving LUE purposeful but flaccid RUE. Moving BLE on pain, left more than right.     OBJECTIVE Vitals:   02/04/19 0500 02/04/19 0600 02/04/19 0723 02/04/19 0749  BP: (!) 169/96 (!) 154/111 (!) 149/113   Pulse: (!) 155 98  (!) 108  Resp: 16 14  20   Temp:   97.7 F (36.5 C)   TempSrc:   Axillary   SpO2: 98% 98%  98%  Weight:      Height:        CBC:  Recent Labs  Lab 02/03/19 0349 02/04/19 0250  WBC 15.6* 13.2*  HGB 11.5* 11.9*  HCT 35.3* 35.0*  MCV 104.1* 102.3*  PLT 828* 843*    Basic Metabolic Panel:  Recent Labs  Lab 02/01/19 0253 02/02/19 0317 02/03/19 0349  NA 140 138 139  K 3.7 3.7 3.5  CL 105 103 104  CO2 27 25 27   GLUCOSE 93 95 92  BUN 19 19 17   CREATININE 0.63 0.62 0.61  CALCIUM 8.2* 8.4* 8.2*  MG 2.1 2.1  --   PHOS 2.8 2.8  --     Lipid Panel:     Component Value Date/Time   CHOL 238 (H) 01/19/2019 0437   TRIG 64 01/19/2019 0437   HDL 43 01/19/2019 0437   CHOLHDL 5.5 01/19/2019 0437   VLDL 13 01/19/2019 0437   LDLCALC 182 (H) 01/19/2019 0437   HgbA1c:  Lab Results  Component Value Date   HGBA1C 5.1 01/19/2019   Urine Drug Screen:     Component Value Date/Time   LABOPIA NONE DETECTED 07/13/2016 1428   COCAINSCRNUR NONE DETECTED 07/13/2016 1428   LABBENZ POSITIVE (A) 07/13/2016 1428   AMPHETMU NONE DETECTED 07/13/2016 1428   THCU POSITIVE (A) 07/13/2016 1428   LABBARB NONE DETECTED 07/13/2016 1428    Alcohol Level     Component Value Date/Time   ETH <5 02/09/2015 0010    IMAGING  CT HEAD WO CONTRAST 01/24/2019 IMPRESSION: Involving acute/subacute infarcts in the left frontal parietal lobe and left occipital lobe. Small amount of hemorrhage in the left posterior frontal infarct unchanged from prior CT and MRI.  Electronically Signed   By: [name] M.D.   On: 01/24/2019 16:18   MRI Wo Contrast  01/20/2019 IMPRESSION: 1. Multifocal acute ischemia within the left MCA territory, including the left postcentral gyrus. No hemorrhage or mass effect. 2. 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 3. Chronic ischemic microangiopathy.  CT Head Wo Contrast  01/19/2019 IMPRESSION: Interval demarcation of an acute cortically based infarct within the lateral left frontal lobe as described. No acute intracranial hemorrhage or significant mass effect.  CT Code Stroke CTA Head W/WO contrast CT Code Stroke CTA Neck W/WO contrast 01/18/2019 IMPRESSION:  Atherosclerotic disease at both carotid bifurcations but no stenosis.  Prominent soft plaque at the left ICA bulb.  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  Dominant right vertebral artery widely patent.  No intracranial large or medium vessel occlusion or correctable proximal stenosis, with specific attention to the left MCA branches.  I do think there are a few distal MCA branches that lose opacification and there could be a few small emboli in the left MCA territory. Vessels marked with arrows.  Distal vessel  atherosclerotic irregularity, particularly evident in the PCA branches.  Aortic Atherosclerosis (ICD10-I70.0)  Emphysema (ICD10-J43.9).   CT HEAD CODE STROKE WO CONTRAST 01/18/2019  IMPRESSION: 1. No acute finding by CT. Chronic small-vessel ischemic changes of the white matter. Old right cerebellar and right occipital infarctions.  2. ASPECTS is 10   Portable Chest 1 View 02/04/19 Stable lung volumes and ventilation with veiling bibasilar and dense retrocardiac opacity. No superimposed pneumothorax and pulmonary vascularity in the upper lobes seems to remain normal. IMPRESSION: 1. Feeding tube removed.  Stable tracheostomy. 2. Stable ventilation since 01/29/2019 with  suspected bilateral pleural effusions and lower lobe collapse.  Transthoracic Echocardiogram  01/19/2019 IMPRESSIONS  1. Left ventricular ejection fraction, by visual estimation, is 55 to 60%. The left ventricle has normal function. There is moderately increased left ventricular hypertrophy.  2. The left ventricle demonstrates regional wall motion abnormalities.  3. Global right ventricle has normal systolic function.The right ventricular size is normal. No increase in right ventricular wall thickness.  4. Left atrial size was normal.  5. Right atrial size was mildly dilated.  6. The mitral valve is normal in structure. Mild to moderate mitral valve regurgitation.  7. The tricuspid valve is normal in structure. Tricuspid valve regurgitation is trivial.  8. The aortic valve is normal in structure. Aortic valve regurgitation is not visualized.  9. The pulmonic valve was normal in structure. Pulmonic valve regurgitation is not visualized. 10. The atrial septum is grossly normal.  ECG atrial fibrillation - ventricular response 133 BPM (See cardiology reading for complete details)  PHYSICAL EXAM  Blood pressure (!) 149/113, pulse (!) 108, temperature 97.7 F (36.5 C), temperature source Axillary, resp. rate 20, height 5' 8.5" (1.74 m), weight 79 kg, SpO2 98 %.  General - frail elderly Caucasian male, on trach collar now  Ophthalmologic - fundi not visualized due to noncooperation.  Cardiovascular - irregularly irregular heart rate and rhythm.  Neuro - pt lying in bed, on trach collar, and PEG in place, tolerating well. He is lethargic but able to open eyes spontaneously. Not following commands. Eyes left gaze preference, barely cross midline. Inconsistently blinking to visual threat bilaterally. Mouthing words but intelligible. PERRL. Right facial droop. Tongue protrusion not cooperative. Left UE at least 4/5, purposeful movement against gravity. RUE flaccid. BLEs withdraw to pain, 3/5 on  the left and 3-/5 on the right. DTR 1+ and bilateral babinski. Sensation, coordination and gait not tested.   ASSESSMENT/PLAN [name] is a 68 y.o. male with history of tobacco use, previous strokes, COPD, AAA, ASPVD, htn, hld, pulmonary nodule, depression, atrial fibrillation on coumadin but recently missed doses presenting with transient aphasia and leaning to the right. The patient received IV t-PA Thursday 01/18/19 @ 2000.  Stroke - left MCA infarct s/p t-PA - embolic pattern, likely due to atrial fibrillation with subtherapeutic INR  Resultant global aphasia, left gaze, right hemiparesis, right neglect and agitation  Code Stroke CT Head - No acute finding by CT. Chronic small-vessel ischemic changes of the white matter. Old right cerebellar and right occipital infarctions. ASPECTS is 10   CT head - Interval demarcation of an acute cortically based infarct within the lateral left frontal lobe as described. No acute intracranial hemorrhage or significant mass effect.  MRI head -  Multifocal acute ischemia within the left MCA territory, including the left postcentral gyrus. No hemorrhage or mass effect. 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 Chronic ischemic microangiopathy.  CTA H&N - Prominent soft plaque at  the left ICA bulb. 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   CT 12/16 stable, continued evolution of left MCA infarcts  2D Echo - EF 55 to 60%. No cardiac source of emboli identified.   [employer] Virus 2 - negative  LDL - 182  HgbA1c - 5.1  VTE prophylaxis -Heparin subq  warfarin daily prior to admission, now on Eliquis  Therapy recommendations:  Signed off, reorder once off vent  Disposition:  Pending  Afib with RVR  On amiodarone and Cardizem po  On metoprolol 100 bid   On warfarin PTA, INR 1.1 on admission INR 1.2 on 02/02/2019  On heparin IV and coumadin  Switched to  Eliquis 12/26  Respiratory failure with hypoxia  Intubated -> s/p trach 12/21 -> trach collar 12/27  Off vent 12/27  CCM on board  B12 deficiency  Vitamin B12 = 161  B12 supplement  History of stroke  11/2014, bilateral blurry vision after left upper extremity bypass surgery.  Found to have left central vision loss.  MRI showed bilateral embolic infarcts.  INR 1.45.  Carotid Doppler negative.  TTE unremarkable.  LDL 82, A1c 5.4.  Continued on discharge with Coumadin and Lipitor 40  hypotension Hx of hypertension  Home BP meds: Diltiazem, Lasix and metoprolol  Current BP meds clonidine 0.3 q 6h , metoprolol 25 bid  BP stable  Off neo . Avoid low BP  Hyperlipidemia  Home Lipid lowering medication: Lipitor 40 mg daily  LDL 182, goal < 70  Current lipid lowering medication: Lipitor 80 mg daily   Continue statin at discharge  Dysphagia  N.p.o.  On bolus TF now  S/p PEG 12/22  Speech following  On FW 200cc Q8h  VAP Fever and leukocytosis  T-max 101.7->100.2->100.4->101.5->99.6->99.3->97.7  Leucocytosis - 7.8->9.0->15.6->16.9->15.6->13.2  CXR 02/04/19 - Stable ventilation since 01/29/2019 with suspected bilateral pleural effusions and lower lobe collapse.  UA neg 01/18/19  On cefepime (started 01/26/19 x 7 days) for S.pneumoniae and E.cloacae in sputum ->now off all antibiotics  CCM on board  Tobacco abuse  Current smoker  Smoking cessation counseling will be provided  Pt is willing to quit  Other Stroke Risk Factors  Advanced age  ETOH use, advised to drink no more than 1 alcoholic beverage per day.  Other Active Problems  Hypokalemia 3.3->3.4->3.3->3.1->4.0->4.3->4.4->4.4->4.0->3.4->3.7->3.5  Oral thrush on nystatin  Thrombocytosis - [phone]  Palliative Care consult 12/25 -> brother wants aggressive care -> (Dr Leonie Man contacted [facility] per family request; however,  [facility] would not agree to accept transfer)   Hospital day #  17  This patient is critically ill and at significant risk of neurological worsening, death and care requires constant monitoring of vital signs, hemodynamics,respiratory and cardiac monitoring, extensive review of multiple databases, frequent neurological assessment, discussion with family, other specialists and medical decision making of high complexity. I spent 35 minutes of neurocritical care time  in the care of  this patient. I discussed with Dr. Nelda Marseille.  Rosalin Hawking, MD PhD Stroke Neurology 02/04/2019 2:26 PM

## 2019-02-04 NOTE — Progress Notes (Signed)
NAME:  [name], MRN:  [id number], DOB:  07/27/1950, LOS: 103 ADMISSION DATE:  01/18/2019, CONSULTATION DATE:  01/18/19 REFERRING MD:  Leonel Ramsay, CHIEF COMPLAINT:  Unable to elicit   Brief History   34yM with hypertensive emergency in setting of acute L MCA stroke s/p tPA, AF/RVR.  Has been noncompliant with his warfarin for A. Fib. Code stroke activated, TPA administered. Intubated for respiratory failure. PCCM consulted for vent management  Significant Hospital Events   12/10 tPA administered at 1946 12/11 intubated  Consults:  PCCM  Procedures:  None  Significant Diagnostic Tests:  CTA h/n 12/10 > distal MCA branches that lose opacification and there could be a few small emboli in the left MCA territory.  CT head 12/11 > Interval demarcation of an acute cortically based infarct within the lateral left frontal lobe as described. No acute intracranial hemorrhage or significant mass effect.  MRI brain 12/12 >  1. Multifocal acute ischemia within the left MCA territory, including the left postcentral gyrus. No hemorrhage or mass effect. 2. 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 3. Chronic ischemic microangiopathy.  Micro Data:  Blood 12/10 > NGTD Urine 12/10 > insignificant growth  MRSA surveillance 12/11 > negative  SARS Coronavirus 2 12/11 > negative  Antimicrobials:  Cefepime 12/18>25 (7 day course)   Interim history/subjective:  Failed CPAP/PS again and required full vent support Intermittent agitation per RN and kicking his urinary and rectal equipment   Objective   Blood pressure (!) 149/113, pulse 98, temperature 97.7 F (36.5 C), temperature source Axillary, resp. rate 14, height 5' 8.5" (1.74 m), weight 79 kg, SpO2 98 %.    Vent Mode: PRVC FiO2 (%):  [40 %] 40 % Set Rate:  [12 bmp] 12 bmp Vt Set:  [540 mL] 540 mL PEEP:  [5 cmH20] 5 cmH20 Pressure Support:  [5 cmH20-10 cmH20] 5 cmH20 Plateau Pressure:  [13 cmH20-19 cmH20] 19 cmH20    Intake/Output Summary (Last 24 hours) at 02/04/2019 0728 Last data filed at 02/04/2019 0600 Gross per 24 hour  Intake 2527.88 ml  Output 1875 ml  Net 652.88 ml   Filed Weights   02/02/19 0408 02/03/19 0500 02/04/19 0412  Weight: 84.5 kg 82.9 kg 79 kg   Examination: General: Acute on chronically ill appearing male, NAD HEENT: [place]/AT, PERRL, EOM-I and MMM, trach in place Neuro: L pupil 50mm R pupil 81mm. Moves L fingers and LLE spontaneously. Grimaces to pain. Does not follow command  CV: IRIR, Nl S1/S2 and -M/R/G PULM: Coarse BS diffusely GI: Soft, NT, ND and +BS Extremities: Trace lower extremity edema. LUE dependent edema No cyanosis or clubbing  Skin: c/d/w/i without   I reviewed CXR myself, trach is in a good position with bilateral lower lobes atelectasis and mild pleural effusion  Discussed with RN and RT  Resolved Hospital Problem list   Hypertensive emergency   Assessment & Plan:  Acute stroke with distal branch occlusions of left MCA -Status post tPA Plan: Per neurology  No transfer to [place], rejected Will need vent facility placement, patient is not weaning well  Acute respiratory failure due to CVA, s/p tracheostomy  -tracheostomy 12/21 by PCCM  Plan: Will continue PS attempt but doubt will liberate from the vent due to severe deconditioning and weakness Continue pulm hygiene, VAP bundle PRN CXR  Titrate O2 for sat of 88-92% Active diureses today, lasix 40 mg IV q6 x3 doses Replace K  Acute Encephalopathy -likely multifactorial-- CVA as above, CNS depressing medications, possible  delirium, substance use hx  Plan: D/C sedation Delirium precautions   VAP - 12/15 with worsening right sided opacities on CXR sputum culture 12/16 with abundant gram positive cocci.  -s/p 7 day course Cefepime  Plan:  VAP prevention bundle Monitor WBC and fever curve  Atrial Fib with RVR  -now rate contolled Plan: Continue tele Continue PO dilt, lopressor,  amiodarone Transition to coumadin  Oral thrush  Plan: Continue nystatin suspension Oral Care   Hypernatremia, resolved   Plan: Continue free water  Inadequate oral intake, Dysphagia in setting of CVA, s/p PEG -PEG 12/22 with CCS Plan: EN via PEG   CSW to facilitate vent facility placement Active diureses today Continue weaning efforts but not optimistic  Best practice:  Diet:EN via PEG  Pain/Anxiety/Delirium protocol (if indicated): fentanyl  VAP protocol (if indicated): In place DVT prophylaxis: IV heparin, SCD  GI prophylaxis: PPI  Glucose control: has not required coverage. D/c accu checks. Hypoglycemia protocol  Mobility: Bedrest Code Status: Full code Family Communication:  Disposition:  ICU  Labs and imaging for past 48 hours reviewed in EMR    The patient is critically ill with multiple organ systems failure and requires high complexity decision making for assessment and support, frequent evaluation and titration of therapies, application of advanced monitoring technologies and extensive interpretation of multiple databases.   Critical Care Time devoted to patient care services described in this note is  31  Minutes. This time reflects time of care of this signee Dr [name]. This critical care time does not reflect procedure time, or teaching time or supervisory time of PA/NP/Med student/Med Resident etc but could involve care discussion time.  Rush Farmer, M.D. [facility] Pulmonary/Critical Care Medicine.

## 2019-02-05 ENCOUNTER — Encounter (HOSPITAL_COMMUNITY): Payer: Self-pay | Admitting: Neurology

## 2019-02-05 DIAGNOSIS — Z79899 Other long term (current) drug therapy: Secondary | ICD-10-CM

## 2019-02-05 DIAGNOSIS — Z7901 Long term (current) use of anticoagulants: Secondary | ICD-10-CM

## 2019-02-05 DIAGNOSIS — D473 Essential (hemorrhagic) thrombocythemia: Secondary | ICD-10-CM

## 2019-02-05 DIAGNOSIS — M1990 Unspecified osteoarthritis, unspecified site: Secondary | ICD-10-CM

## 2019-02-05 DIAGNOSIS — E785 Hyperlipidemia, unspecified: Secondary | ICD-10-CM

## 2019-02-05 DIAGNOSIS — F17210 Nicotine dependence, cigarettes, uncomplicated: Secondary | ICD-10-CM

## 2019-02-05 DIAGNOSIS — J9600 Acute respiratory failure, unspecified whether with hypoxia or hypercapnia: Secondary | ICD-10-CM

## 2019-02-05 DIAGNOSIS — J449 Chronic obstructive pulmonary disease, unspecified: Secondary | ICD-10-CM

## 2019-02-05 DIAGNOSIS — C946 Myelodysplastic disease, not classified: Secondary | ICD-10-CM

## 2019-02-05 DIAGNOSIS — N401 Enlarged prostate with lower urinary tract symptoms: Secondary | ICD-10-CM

## 2019-02-05 LAB — IRON AND TIBC
Iron: 41 ug/dL — ABNORMAL LOW (ref 45–182)
Saturation Ratios: 13 % — ABNORMAL LOW (ref 17.9–39.5)
TIBC: 305 ug/dL (ref 250–450)
UIBC: 264 ug/dL

## 2019-02-05 LAB — TRANSFERRIN: Transferrin: 218 mg/dL (ref 180–329)

## 2019-02-05 LAB — RETICULOCYTES
Immature Retic Fract: 19.9 % — ABNORMAL HIGH (ref 2.3–15.9)
RBC.: 3.94 MIL/uL — ABNORMAL LOW (ref 4.22–5.81)
Retic Count, Absolute: 122.5 10*3/uL (ref 19.0–186.0)
Retic Ct Pct: 3.1 % (ref 0.4–3.1)

## 2019-02-05 LAB — CBC
HCT: 38 % — ABNORMAL LOW (ref 39.0–52.0)
Hemoglobin: 13.3 g/dL (ref 13.0–17.0)
MCH: 35.2 pg — ABNORMAL HIGH (ref 26.0–34.0)
MCHC: 35 g/dL (ref 30.0–36.0)
MCV: 100.5 fL — ABNORMAL HIGH (ref 80.0–100.0)
Platelets: 914 10*3/uL (ref 150–400)
RBC: 3.78 MIL/uL — ABNORMAL LOW (ref 4.22–5.81)
RDW: 14.6 % (ref 11.5–15.5)
WBC: 12.6 10*3/uL — ABNORMAL HIGH (ref 4.0–10.5)
nRBC: 0 % (ref 0.0–0.2)

## 2019-02-05 LAB — BASIC METABOLIC PANEL
Anion gap: 11 (ref 5–15)
BUN: 19 mg/dL (ref 8–23)
CO2: 28 mmol/L (ref 22–32)
Calcium: 9 mg/dL (ref 8.9–10.3)
Chloride: 97 mmol/L — ABNORMAL LOW (ref 98–111)
Creatinine, Ser: 0.77 mg/dL (ref 0.61–1.24)
GFR calc Af Amer: >60 mL/min (ref >60)
GFR calc non Af Amer: >60 mL/min (ref >60)
Glucose, Bld: 101 mg/dL — ABNORMAL HIGH (ref 70–99)
Potassium: 3.8 mmol/L (ref 3.5–5.1)
Sodium: 136 mmol/L (ref 135–145)

## 2019-02-05 LAB — PHOSPHORUS: Phosphorus: 3.8 mg/dL (ref 2.5–4.6)

## 2019-02-05 LAB — FOLATE: Folate: 4.7 ng/mL — ABNORMAL LOW (ref >5.9)

## 2019-02-05 LAB — VITAMIN B12: Vitamin B-12: 644 pg/mL (ref 180–914)

## 2019-02-05 LAB — SAVE SMEAR(SSMR), FOR PROVIDER SLIDE REVIEW

## 2019-02-05 LAB — GLUCOSE, CAPILLARY
Glucose-Capillary: 101 mg/dL — ABNORMAL HIGH (ref 70–99)
Glucose-Capillary: 86 mg/dL (ref 70–99)
Glucose-Capillary: 131 mg/dL — ABNORMAL HIGH (ref 70–99)
Glucose-Capillary: 114 mg/dL — ABNORMAL HIGH (ref 70–99)
Glucose-Capillary: 140 mg/dL — ABNORMAL HIGH (ref 70–99)

## 2019-02-05 LAB — FERRITIN: Ferritin: 783 ng/mL — ABNORMAL HIGH (ref 24–336)

## 2019-02-05 LAB — MAGNESIUM: Magnesium: 1.9 mg/dL (ref 1.7–2.4)

## 2019-02-05 MED ORDER — SODIUM CHLORIDE 0.9 % IV SOLN
510.0000 mg | Freq: Once | INTRAVENOUS | Status: AC
  Administered 2019-02-06: 02:00:00 510 mg via INTRAVENOUS
  Filled 2019-02-05 (×2): qty 17

## 2019-02-05 MED ORDER — FUROSEMIDE 10 MG/ML IJ SOLN
40.0000 mg | Freq: Once | INTRAMUSCULAR | Status: AC
  Administered 2019-02-05: 40 mg via INTRAVENOUS
  Filled 2019-02-05: qty 4

## 2019-02-05 MED ORDER — HYDROXYUREA 100 MG/ML ORAL SUSPENSION
500.0000 mg | Freq: Every day | ORAL | Status: DC
  Administered 2019-02-06 – 2019-02-14 (×9): 500 mg
  Filled 2019-02-05 (×11): qty 5

## 2019-02-05 NOTE — Progress Notes (Addendum)
NAME:  [name], MRN:  [id number], DOB:  [birth year], LOS: 62 ADMISSION DATE:  01/18/2019, CONSULTATION DATE:  01/18/19 REFERRING MD:  Leonel Ramsay, CHIEF COMPLAINT:  Unable to elicit   Brief History   12yM with hypertensive emergency in setting of acute L MCA stroke s/p tPA, AF/RVR.  Has been noncompliant with his warfarin for A. Fib. Code stroke activated, TPA administered. Intubated for respiratory failure. PCCM consulted for vent management  Significant Hospital Events   12/10 tPA administered at 1946 12/11 intubated 12/21 Tracheostomy  Consults:  PCCM  Procedures:  None  Significant Diagnostic Tests:  CTA h/n 12/10 > distal MCA branches that lose opacification and there could be a few small emboli in the left MCA territory.  CT head 12/11 > Interval demarcation of an acute cortically based infarct within the lateral left frontal lobe as described. No acute intracranial hemorrhage or significant mass effect.  MRI brain 12/12 >  1. Multifocal acute ischemia within the left MCA territory, including the left postcentral gyrus. No hemorrhage or mass effect. 2. 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 3. Chronic ischemic microangiopathy.  Micro Data:  Blood 12/10 > NGTD Urine 12/10 > insignificant growth  MRSA surveillance 12/11 > negative  SARS Coronavirus 2 12/11 > negative  Antimicrobials:  Cefepime 12/18>25 (7 day course)   Interim history/subjective:  Tolerating trach collar this morning. Good UOP in last 24 hours. Opens eyes to voice. Does not follow commands  Objective   Blood pressure (!) 159/89, pulse 93, temperature 98.4 F (36.9 C), temperature source Oral, resp. rate (!) 24, height 5' 8.5" (1.74 m), weight 72.2 kg, SpO2 98 %.    Vent Mode: PSV;CPAP FiO2 (%):  [40 %] 40 % PEEP:  [5 cmH20] 5 cmH20 Pressure Support:  [5 cmH20] 5 cmH20   Intake/Output Summary (Last 24 hours) at 02/05/2019 [id number] Last data filed at 02/05/2019 0600 Gross per 24  hour  Intake 1311 ml  Output 8965 ml  Net -7654 ml   Filed Weights   02/03/19 0500 02/04/19 0412 02/05/19 0208  Weight: 82.9 kg 79 kg 72.2 kg   Physical Exam: General: Chronically ill-appearing, no acute distress HENT: [place], AT, OP clear, MMM, grimaces to pain Neck: Trach in place Eyes: EOMI, no scleral icterus Respiratory: Anterior coarse breath sounds Cardiovascular: RRR, -M/R/G, no JVD GI: BS+, soft, nontender, PEG in place Extremities:-Edema,-tenderness Neuro: Open eyes to voice, does not follow commands GU: Foley in place  Resolved Hospital Problem list   Hypertensive emergency Hypernatremia   Assessment & Plan:  Acute stroke with distal branch occlusions of left MCA -Status post tPA Plan: Per neurology Continue statin and Eliquis No transfer to [facility], rejected Will need vent facility placement, patient is not weaning well  Acute respiratory failure due to CVA, s/p tracheostomy  -tracheostomy 12/21 by PCCM  Plan: Tolerating trach collar Continue weaning from vent as tolerated Continue pulm hygiene, VAP bundle PRN CXR  Titrate O2 for sat of 88-92% Diurese with lasix 40 mg x 1 today  Acute Encephalopathy -likely multifactorial-- CVA as above, CNS depressing medications, possible delirium, substance use hx  Plan: Minimize sedation Delirium precautions   Streptoccoccus pneumoniae and Enterobacter cloacae pneumonia VAP - 12/15 with worsening right sided opacities on CXR sputum culture 12/16 with abundant gram positive cocci.  -s/p 7 day course Cefepime  Plan:  VAP prevention bundle Monitor WBC and fever curve  Atrial Fib with RVR  -now rate contolled Plan: Continue tele Continue PO dilt, lopressor, amiodarone  Continue Eliquis  Oral thrush  Plan: Continue nystatin suspension Oral Care   Hypernatremia, resolved   Plan: Discontinue free water  Thrombocytosis Gradually increasing since admission. ?Reactive Plan: Consult Hematology  Inadequate  oral intake, Dysphagia in setting of CVA, s/p PEG -PEG 12/22 with CCS Plan: EN via PEG   CSW to facilitate vent facility placement Continue diureses today  Best practice:  Diet: EN via PEG  Pain/Anxiety/Delirium protocol (if indicated): fentanyl  VAP protocol (if indicated): In place DVT prophylaxis: Systemic anticoagulation with Eliquis, SCD  GI prophylaxis: PPI  Glucose control: has not required coverage. D/c accu checks. Hypoglycemia protocol  Mobility: Bedrest Code Status: Full code Family Communication: Updated brother, [name], on 12/28. He would like stroke team to contact [facility] again on a non-holiday to discuss possible transfer. I have contacted Stroke attending regarding request. Disposition:  ICU  Labs and imaging for past 48 hours reviewed in EMR    The patient is critically ill and requires high complexity decision making for assessment and support, frequent evaluation and titration of therapies, application of advanced monitoring technologies and extensive interpretation of multiple databases.   Critical Care Time devoted to patient care services described in this note is 33 Minutes.   Rodman Pickle, M.D. [facility] Pulmonary/Critical Care Medicine 02/05/2019 7:22 AM   Please see Amion for pager number to reach on-call Pulmonary and Critical Care Team.

## 2019-02-05 NOTE — Consult Note (Addendum)
[name]  [phone] [phone]    [place]  Referring MD:  Dr. Rosalin Hawking  Reason for Referral: Thrombocytosis  HPI: [name] is a 68 year old male with a past medical history significant for multiple medical problems including atrial fibrillation who was probably taking Coumadin but was out of his medication, abdominal aortic aneurysm, LUTS, and arthritis.  The patient presented to the emergency room on 01/18/2019 with right-sided weakness.  He developed an abrupt change with leaning to the right and becoming aphasic.  TPA was administered.  He was intubated due to respiratory failure.  Tracheostomy placed 01/29/2019.  Lab work has been extensively reviewed.  Platelet count on admission was 244,000.  Platelet count started to rise during this admission starting on 01/28/2019.  Today, platelet count is up to 914,000.  Most recent lab available to me prior to this admission was performed on 07/17/2016 at that time his platelet count was 196,000.  When seen today, there is no family at the bedside. The patient opens eyes to voice. Does not answer questions or follow command. Spoke with nursing who has not noticed any bruising or bleeding. Hematology was asked to see the patient to make recommendations about his thrombocytosis.   Past Medical History:  Diagnosis Date  . AAA (abdominal aortic aneurysm) ([place])    per last CT 03-26-2016---  3.9cm  . Anticoagulated on Coumadin   . Arthritis of spine   . Benign localized prostatic hyperplasia with lower urinary tract symptoms (LUTS)   . Chronic atrial fibrillation ([place]) cardiolgoist -- dr Stanford Breed   dx 2009  . Complication of anesthesia    per pt today (07-08-2016) had problem after having anesthesia 12/ 2016 surgery at [facility] "not good" went to ED ( in epic ED visit 01/ 2017 dx severe episode of major depression disorder and admitted to [facility]  . COPD with emphysema ([place])   . Dyspnea  on exertion   . Full dentures   . GERD (gastroesophageal reflux disease)   . Hematuria   . History of amaurosis fugax    right eye 02-08-2015 -- resolved /  documented in epic possible left eye amaurosis fugax 11/ 2008 (pt denies) / per last MRI show previous bilateral occipital lobe infarcts  . History of concussion    as child  . History of CVA (cerebrovascular accident) 10/ 2016  and per pt Dec 2017 in [place]   neurologist-  dr Leonie Man-- per note silent infarcts on MRI-- multiple acute infarcts bilateral cerebullm, right temporal lobe, and bilateral occipital lobes due to embolic phenomena (atrial fib.)  . History of seizures as a child   . Hyperlipidemia   . Hypertension   . Left arm numbness    occasional numbess post residual axilla arterial occlusion and surgery  . Major depressive disorder    hx severe episode without psychosis 02/2015 w/ homicidal ideation  . Narcotic dependence ([place])   . PAD (peripheral artery disease) ([place])    followed by dr fields-- s/p  left axilla to branchial and left axilla to ulnar bypass graft due to ischemic hand  . Pulmonary nodule    per CT 03-26-2016  right lower lobe  . PVD (peripheral vascular disease) ([place])   . Right ureteral stone   . Senile purpura ([place])   . Weakness of left arm    residual from axilla arterial occlusion and post surgery  . Wears glasses   :    Past Surgical History:  Procedure Laterality Date  . ARCH AORTOGRAM N/A 04/05/2014   Procedure: ARCH AORTOGRAM, FIRST ORDER CATHETERIZATION LEFT SUBCLAVIAN ARTERY;  Surgeon: Elam Dutch, MD;  Location: [facility] OR;  Service: Vascular;  Laterality: N/A;  . AXILLARY-FEMORAL BYPASS GRAFT Left 04/08/2014   Procedure: LEFT AXILLARY ARTERY TO BRACHIAL ARTERY BYPASS USING NON REVERSE LEFT GREATER SAPHENOUS VEIN ,LIGATION OF LEFT AXILLARY ARTERY ANEURYSM;  Surgeon: Elam Dutch, MD;  Location: [place];  Service: Vascular;  Laterality: Left;  . BYPASS AXILLA/BRACHIAL ARTERY Left  01/27/2015   Procedure: LEFT BRACHIAL-ULAR ARTERY BYPASS USING GREATER SAPHENOUS VEIN;  Surgeon: Elam Dutch, MD;  Location: [place];  Service: Vascular;  Laterality: Left;  . CYSTOSCOPY WITH RETROGRADE PYELOGRAM, URETEROSCOPY AND STENT PLACEMENT Right 07/17/2016   Procedure: CYSTOSCOPY WITH RETROGRADE PYELOGRAM, URETEROSCOPY AND STENT PLACEMENT,LASER;  Surgeon: Festus Aloe, MD;  Location: [facility] ORS;  Service: Urology;  Laterality: Right;  . CYSTOSCOPY/URETEROSCOPY/HOLMIUM LASER/STENT PLACEMENT Right 07/13/2016   Procedure: CYSTOSCOPY, RIGHT URETEROSCOPY RETROGRADE PYELOGRAM;  Surgeon: Kathie Rhodes, MD;  Location: [facility];  Service: Urology;  Laterality: Right;  . EMBOLECTOMY Left 04/05/2014   Procedure: THROMBO ENDARTERECTOMY OF LEFT BRACHIAL, RADIAL AND ULNAR ARTERY,  Left Radial artery cut down and radial artery thrombectomy.;  Surgeon: Elam Dutch, MD;  Location: [facility] OR;  Service: Vascular;  Laterality: Left;  . ESOPHAGOGASTRODUODENOSCOPY N/A 01/30/2019   Procedure: ESOPHAGOGASTRODUODENOSCOPY (EGD);  Surgeon: Jesusita Oka, MD;  Location: [facility] ENDOSCOPY;  Service: General;  Laterality: N/A;  . facial cyst Right    cyst removal.   . PATCH ANGIOPLASTY Left 04/05/2014   Procedure: LEFT ARM VEIN PATCH ANGIOPLASTY;  Surgeon: Elam Dutch, MD;  Location: [place];  Service: Vascular;  Laterality: Left;  . PEG PLACEMENT N/A 01/30/2019   Procedure: PERCUTANEOUS ENDOSCOPIC GASTROSTOMY (PEG) PLACEMENT;  Surgeon: Jesusita Oka, MD;  Location: [place];  Service: General;  Laterality: N/A;  . PERIPHERAL VASCULAR CATHETERIZATION N/A 01/22/2015   Procedure: Aortic Arch Angiography;  Surgeon: Elam Dutch, MD;  Location: [place] CV LAB;  Service: Cardiovascular;  Laterality: N/A;  . THROMBECTOMY BRACHIAL ARTERY Left 11/20/2014   Procedure: 1.  Thrombectomy Left Axilo-Brachial Bypass  2.  Thromboendarterecotmy of Left Brachial Artery with Fogarty Thrombectomy of Radial  and Ulnar Arteries with Dacron patch angioplasty Left Brachial Artery. 3. Intraoperative  Arteriogram times four.;  Surgeon: Mal Misty, MD;  Location: [facility];  Service: Vascular;  Laterality: Left;  . TRANSTHORACIC ECHOCARDIOGRAM  11/25/2014   moderate concentric LVH, ef 60-65%, unable to evaluation LVDF due to atrial fib/  mild MR/  severe LAE  . VEIN HARVEST Left 04/08/2014   Procedure: LEFT GREATER SAPHENOUS VEIN HARVEST;  Surgeon: Elam Dutch, MD;  Location: [place];  Service: Vascular;  Laterality: Left;  Marland Kitchen VEIN HARVEST Right 01/27/2015   Procedure: RIGHT GREATER SAPHENOUS VEIN HARVEST;  Surgeon: Elam Dutch, MD;  Location: [place];  Service: Vascular;  Laterality: Right;  :   CURRENT MEDS: Current Facility-Administered Medications  Medication Dose Route Frequency Provider Last Rate Last Admin  .  stroke: mapping our early stages of recovery book   Does not apply Once Greta Doom, MD      . 0.9 %  sodium chloride infusion   Intravenous PRN Garvin Fila, MD 10 mL/hr at 02/01/19 0541 Rate Verify at 02/01/19 0541  . acetaminophen (TYLENOL) tablet 650 mg  650 mg Oral Q4H PRN Greta Doom, MD   650 mg at 01/26/19  2345   Or  . acetaminophen (TYLENOL) 160 MG/5ML solution 650 mg  650 mg Per Tube Q4H PRN Greta Doom, MD   650 mg at 01/31/19 1707   Or  . acetaminophen (TYLENOL) suppository 650 mg  650 mg Rectal Q4H PRN Greta Doom, MD      . amiodarone (PACERONE) tablet 200 mg  200 mg Per Tube Daily Kipp Brood, MD   200 mg at 02/05/19 0939  . apixaban (ELIQUIS) tablet 5 mg  5 mg Per Tube BID Rosalin Hawking, MD   5 mg at 02/05/19 9628  . atorvastatin (LIPITOR) tablet 80 mg  80 mg Per Tube q1800 Rosalin Hawking, MD   80 mg at 02/04/19 1727  . chlorhexidine gluconate (MEDLINE KIT) (PERIDEX) 0.12 % solution 15 mL  15 mL Mouth Rinse BID Rush Farmer, MD   15 mL at 02/05/19 0758  . Chlorhexidine Gluconate Cloth 2 % PADS 6 each  6 each Topical Daily  Rosalin Hawking, MD   6 each at 02/05/19 0940  . diltiazem (CARDIZEM) 10 mg/ml oral suspension 30 mg  30 mg Per Tube Q6H Agarwala, Ravi, MD   30 mg at 02/05/19 1139  . feeding supplement (OSMOLITE 1.5 CAL) liquid 237 mL  237 mL Per Tube QID Rayburn, Kelly A, PA-C   237 mL at 02/05/19 1322  . feeding supplement (PRO-STAT SUGAR FREE 64) liquid 30 mL  30 mL Per Tube TID Rush Farmer, MD   30 mL at 02/05/19 0939  . fentaNYL (SUBLIMAZE) injection 25 mcg  25 mcg Intravenous Q15 min PRN Merlene Laughter F, NP   25 mcg at 02/02/19 2024  . fentaNYL (SUBLIMAZE) injection 25-100 mcg  25-100 mcg Intravenous Q30 min PRN Merlene Laughter F, NP   100 mcg at 02/02/19 2102  . insulin aspart (novoLOG) injection 0-15 Units  0-15 Units Subcutaneous Q4H Merlene Laughter F, NP   2 Units at 02/05/19 1139  . MEDLINE mouth rinse  15 mL Mouth Rinse 10 times per day Rush Farmer, MD   15 mL at 02/05/19 1322  . metoprolol tartrate (LOPRESSOR) 25 mg/10 mL oral suspension 100 mg  100 mg Per Tube BID Spero Geralds, MD   100 mg at 02/05/19 0939  . metoprolol tartrate (LOPRESSOR) injection 5 mg  5 mg Intravenous Q4H PRN Merlene Laughter F, NP   5 mg at 02/05/19 3662  . nystatin (MYCOSTATIN) 100000 UNIT/ML suspension 500,000 Units  5 mL Oral TID AC & HS Merlene Laughter F, NP   500,000 Units at 02/05/19 1139  . pantoprazole sodium (PROTONIX) 40 mg/20 mL oral suspension 40 mg  40 mg Per Tube Daily Rosalin Hawking, MD   40 mg at 02/05/19 0939  . sennosides (SENOKOT) 8.8 MG/5ML syrup 5 mL  5 mL Per Tube BID Rosalin Hawking, MD      . vitamin B-12 (CYANOCOBALAMIN) tablet 1,000 mcg  1,000 mcg Per Tube Daily Rosalin Hawking, MD   1,000 mcg at 02/05/19 9476      Allergies  Allergen Reactions  . Oxycodone Itching  :  Family History  Problem Relation Age of Onset  . Hypertension Mother   . Lung cancer Mother   . Stomach cancer Father        died age 71  . Cancer Sister   . Early death Neg Hx   . Heart disease Neg Hx   . Hyperlipidemia Neg Hx     . Kidney disease Neg Hx   .  Stroke Neg Hx   :  Social History   Socioeconomic History  . Marital status: Divorced    Spouse name: Not on file  . Number of children: 1  . Years of education: Not on file  . Highest education level: Not on file  Occupational History  . 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 Smoking status: Current Every Day Smoker    Packs/day: 2.00    Years: 51.00    Pack years: 102.00    Types: Cigarettes  . Smokeless tobacco: Never Used  . Tobacco comment: per pt 07-08-2016 down to 2ppd from 4 ppd (quit one time for 4 years since started at age 5)  Substance and Sexual Activity  . Alcohol use: Yes    Alcohol/week: 0.0 standard drinks    Comment: occasional   . Drug use: No    Comment: per pt 07-08-2016 hx drug use stopped 1970's  . Sexual activity: Not on file  Other Topics Concern  . Not on file  Social History Narrative  . Not on file   Social Determinants of Health   Financial Resource Strain:   . Difficulty of Paying Living Expenses: Not on file  Food Insecurity:   . Worried About Charity fundraiser in the Last Year: Not on file  . Ran Out of Food in the Last Year: Not on file  Transportation Needs:   . Lack of Transportation (Medical): Not on file  . Lack of Transportation (Non-Medical): Not on file  Physical Activity:   . Days of Exercise per Week: Not on file  . Minutes of Exercise per Session: Not on file  Stress:   . Feeling of Stress : Not on file  Social Connections:   . Frequency of Communication with Friends and Family: Not on file  . Frequency of Social Gatherings with Friends and Family: Not on file  . Attends Religious Services: Not on file  . Active Member of Clubs or Organizations: Not on file  . Attends Archivist Meetings: Not on file  . Marital Status: Not on file  Intimate Partner Violence:   . Fear of Current or Ex-Partner: Not on file  . Emotionally Abused: Not on file   . Physically Abused: Not on file  . Sexually Abused: Not on file  :  REVIEW OF SYSTEMS:  Unable to obtain secondary to patient condition.    Exam: Patient Vitals for the past 24 hrs:  BP Temp Temp src Pulse Resp SpO2 Weight  02/05/19 1300 120/78 -- -- 79 18 97 % --  02/05/19 1200 133/86 -- -- 70 20 98 % --  02/05/19 1118 -- -- -- -- 18 -- --  02/05/19 1116 -- (!) 97.5 F (36.4 C) Axillary -- -- -- --  02/05/19 1100 (!) 131/104 -- -- 95 -- 97 % --  02/05/19 1000 (!) 146/105 -- -- (!) 105 (!) 22 96 % --  02/05/19 0900 (!) 141/90 -- -- 99 20 97 % --  02/05/19 0830 (!) 145/101 -- -- 100 (!) 23 96 % --  02/05/19 0815 -- -- -- 64 19 96 % --  02/05/19 0805 (!) 155/108 -- -- (!) 107 (!) 25 94 % --  02/05/19 0800 (!) 154/116 -- -- 84 18 97 % --  02/05/19 0755 -- 98.5 F (36.9 C) Oral -- -- -- --  02/05/19 0720 -- -- -- (!) 108 (!) 22 97 % --  02/05/19 0700 Marland Kitchen)  155/113 -- -- 93 (!) 23 99 % --  02/05/19 0600 (!) 159/89 -- -- 93 (!) 24 98 % --  02/05/19 0500 (!) 149/110 -- -- 93 (!) 24 97 % --  02/05/19 0400 (!) 132/100 -- -- 87 (!) 24 97 % --  02/05/19 0338 -- 98.4 F (36.9 C) Oral -- -- -- --  02/05/19 0305 (!) 152/104 -- -- (!) 107 18 96 % --  02/05/19 0300 (!) 152/104 -- -- (!) 111 (!) 24 95 % --  02/05/19 0208 -- -- -- -- -- -- 159 lb 2.8 oz (72.2 kg)  02/05/19 0200 (!) 149/107 -- -- 100 (!) 24 96 % --  02/05/19 0100 (!) 117/98 -- -- (!) 131 (!) 23 95 % --  02/05/19 0000 (!) 140/104 -- -- 88 (!) 24 98 % --  02/04/19 2333 -- 98.4 F (36.9 C) Oral -- -- -- --  02/04/19 2322 (!) 133/100 -- -- 83 (!) 23 95 % --  02/04/19 2300 (!) 133/100 -- -- 82 18 97 % --  02/04/19 2200 128/78 -- -- 77 (!) 22 96 % --  02/04/19 2115 (!) 133/99 -- -- (!) 109 -- -- --  02/04/19 2100 (!) 133/99 -- -- (!) 50 15 96 % --  02/04/19 2030 121/81 -- -- (!) 57 (!) 24 95 % --  02/04/19 2000 (!) 139/91 -- -- (!) 110 16 93 % --  02/04/19 1926 (!) 143/102 -- -- (!) 103 (!) 26 97 % --  02/04/19 1924 -- 98.4 F  (36.9 C) Axillary -- -- -- --  02/04/19 1900 (!) 143/102 -- -- (!) 106 20 94 % --  02/04/19 1800 (!) 144/89 -- -- (!) 107 (!) 23 97 % --  02/04/19 1700 (!) 138/113 -- -- (!) 108 (!) 21 98 % --  02/04/19 1606 -- -- -- -- (!) 24 -- --  02/04/19 1600 137/89 -- -- 92 (!) 21 95 % --  02/04/19 1516 -- 98.7 F (37.1 C) Oral -- -- -- --  02/04/19 1500 (!) 148/107 -- -- 87 (!) 24 99 % --  02/04/19 1400 (!) 133/92 -- -- 81 17 99 % --    General:  Chronically ill appearing male, no distress.   Eyes:  no scleral icterus.   ENT:  There were no oropharyngeal lesions. Trach midline.    Neck was without thyromegaly.   Lymphatics:  Negative cervical, supraclavicular or axillary adenopathy.   Respiratory: Coarse breath sounds anteriorly  Cardiovascular:  Regular rate and rhythm, S1/S2, without murmur, rub or gallop.  There was no pedal edema.   GI:  abdomen was soft, flat, nontender, nondistended, PEG in place.   Skin exam was without ecchymosis, petechiae.   Neuro: Opens eyes to voice, does not follow commands  LABS:  Lab Results  Component Value Date   WBC 12.6 (H) 02/05/2019   HGB 13.3 02/05/2019   HCT 38.0 (L) 02/05/2019   PLT 914 (HH) 02/05/2019   GLUCOSE 101 (H) 02/05/2019   CHOL 238 (H) 01/19/2019   TRIG 64 01/19/2019   HDL 43 01/19/2019   LDLDIRECT 148.7 12/13/2012   LDLCALC 182 (H) 01/19/2019   ALT 23 01/18/2019   AST 22 01/18/2019   NA 136 02/05/2019   K 3.8 02/05/2019   CL 97 (L) 02/05/2019   CREATININE 0.77 02/05/2019   BUN 19 02/05/2019   CO2 28 02/05/2019   PSA 0.66 12/13/2012   INR 1.2 02/03/2019   HGBA1C 5.1 01/19/2019  CT Code Stroke CTA Head W/WO contrast  Result Date: 01/18/2019 CLINICAL DATA:  Acute left MCA occlusion syndrome. EXAM: CT ANGIOGRAPHY HEAD AND NECK TECHNIQUE: Multidetector CT imaging of the head and neck was performed using the standard protocol during bolus administration of intravenous contrast. Multiplanar CT image reconstructions and MIPs  were obtained to evaluate the vascular anatomy. Carotid stenosis measurements (when applicable) are obtained utilizing NASCET criteria, using the distal internal carotid diameter as the denominator. CONTRAST:  126m OMNIPAQUE IOHEXOL 350 MG/ML SOLN COMPARISON:  Head CT earlier same day FINDINGS: CTA NECK FINDINGS Aortic arch: Aortic atherosclerosis. No aneurysm or dissection. Branching pattern is normal without origin stenosis. Right carotid system: Common carotid artery is widely patent to the bifurcation region. Soft and calcified plaque at the carotid bifurcation and ICA bulb but no stenosis. Cervical ICA is patent to the skull base. Left carotid system: Common carotid artery patent to the bifurcation. Calcified and soft plaque at the carotid bifurcation and ICA bulb. No stenosis. Cervical ICA widely patent. Vertebral arteries: Dominant right vertebral artery is widely patent at the origin and through the cervical region to the foramen magnum. Left vertebral artery is occluded at the origin and reconstitutes in the distal cervical region from collaterals. Skeleton: Mild midcervical spondylosis. Other neck: No mass or lymphadenopathy. Upper chest: Emphysema. No focal or active process otherwise. CTA HEAD FINDINGS Anterior circulation: Both internal carotid arteries are patent through the siphon regions. No siphon stenosis. Anterior and middle cerebral vessels are patent without flow limiting proximal stenosis, aneurysm or vascular malformation. Both anterior cerebral arteries receive there supply from the left carotid circulation. I do not see any large or medium vessel occlusion, with particular attention to the left MCA branches. I think there may be a few M3 to M4 branches on the left that lose opacification distally and there could be a few small left MCA territory emboli. Posterior circulation: Both vertebral arteries show flow at the foramen magnum with the right being dominant. Bilateral PICA flow  demonstrated. No basilar stenosis. Superior cerebellar and posterior cerebral arteries show flow. Distal PCA branches show atherosclerotic irregularity. Venous sinuses: Patent and normal. Anatomic variants: None significant otherwise. Review of the MIP images confirms the above findings IMPRESSION: Atherosclerotic disease at both carotid bifurcations but no stenosis. Prominent soft plaque at the left ICA bulb. 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 Dominant right vertebral artery widely patent. No intracranial large or medium vessel occlusion or correctable proximal stenosis, with specific attention to the left MCA branches. I do think there are a few distal MCA branches that lose opacification and there could be a few small emboli in the left MCA territory. Vessels marked with arrows. Distal vessel atherosclerotic irregularity, particularly evident in the PCA branches. Aortic Atherosclerosis (ICD10-I70.0) and Emphysema (ICD10-J43.9). Electronically Signed   By: MNelson ChimesM.D.   On: 01/18/2019 19:59   CT HEAD WO CONTRAST  Result Date: 01/24/2019 CLINICAL DATA:  Stroke follow-up EXAM: CT HEAD WITHOUT CONTRAST TECHNIQUE: Contiguous axial images were obtained from the base of the skull through the vertex without intravenous contrast. COMPARISON:  CT head 01/19/2019, MRI 12 FINDINGS: Brain: Evolving area of infarction in the left temporal and parietal lobe. Small amount of hemorrhage is present in the infarct as noted on MRI prior CT, unchanged. Hypodensity left occipital lobe compatible with evolving recent infarct. This is difficult to see on the prior CT but is noted on the prior MRI.  No associated hemorrhage. Chronic infarct right cerebellum is unchanged. Mild atrophy. Chronic microvascular ischemic changes in the white matter. Negative for hydrocephalus Vascular: Negative for hyperdense vessel Skull: Negative Sinuses/Orbits: Mucosal edema  throughout the paranasal sinuses. Negative orbit. Other: None IMPRESSION: Involving acute/subacute infarcts in the left frontal parietal lobe and left occipital lobe. Small amount of hemorrhage in the left posterior frontal infarct unchanged from prior CT and MRI. Electronically Signed   By: Franchot Gallo M.D.   On: 01/24/2019 16:18   CT Head Wo Contrast  Result Date: 01/19/2019 CLINICAL DATA:  Stroke, follow-up. EXAM: CT HEAD WITHOUT CONTRAST TECHNIQUE: Contiguous axial images were obtained from the base of the skull through the vertex without intravenous contrast. COMPARISON:  CT angiogram head 01/18/2019, non-contrast CT head 01/18/2019, MRA head 11/25/2014, brain MRI 11/23/2014. FINDINGS: Brain: Loss of gray-white differentiation within portions of the lateral left frontal lobe (series 3, images 25 and 26) (series 6, images 50-54). Findings are consistent with acute infarct. No evidence of acute intracranial hemorrhage. No intracranial mass. No midline shift or extra-axial fluid collection. Chronic lacunar infarct within the right caudate nucleus. Additional chronic infarcts within the bilateral cerebellar hemispheres, right temporal lobe, bilateral occipital lobes and right parietal lobe. Several of these remote infarcts were better appreciated on prior brain MRI 11/23/2014. Mild generalized parenchymal atrophy. Vascular: No hyperdense vessel.  Atherosclerotic calcifications. Skull: Normal. Negative for fracture or focal lesion. Sinuses/Orbits: Right frontal scalp/periorbital soft tissue hematoma. No significant paranasal sinus disease or mastoid effusion at the imaged levels. Partially visualized life-support tubes. IMPRESSION: Interval demarcation of an acute cortically based infarct within the lateral left frontal lobe as described. No acute intracranial hemorrhage or significant mass effect. Electronically Signed   By: Kellie Simmering DO   On: 01/19/2019 12:50   CT Code Stroke CTA Neck W/WO  contrast  Result Date: 01/18/2019 CLINICAL DATA:  Acute left MCA occlusion syndrome. EXAM: CT ANGIOGRAPHY HEAD AND NECK TECHNIQUE: Multidetector CT imaging of the head and neck was performed using the standard protocol during bolus administration of intravenous contrast. Multiplanar CT image reconstructions and MIPs were obtained to evaluate the vascular anatomy. Carotid stenosis measurements (when applicable) are obtained utilizing NASCET criteria, using the distal internal carotid diameter as the denominator. CONTRAST:  169m OMNIPAQUE IOHEXOL 350 MG/ML SOLN COMPARISON:  Head CT earlier same day FINDINGS: CTA NECK FINDINGS Aortic arch: Aortic atherosclerosis. No aneurysm or dissection. Branching pattern is normal without origin stenosis. Right carotid system: Common carotid artery is widely patent to the bifurcation region. Soft and calcified plaque at the carotid bifurcation and ICA bulb but no stenosis. Cervical ICA is patent to the skull base. Left carotid system: Common carotid artery patent to the bifurcation. Calcified and soft plaque at the carotid bifurcation and ICA bulb. No stenosis. Cervical ICA widely patent. Vertebral arteries: Dominant right vertebral artery is widely patent at the origin and through the cervical region to the foramen magnum. Left vertebral artery is occluded at the origin and reconstitutes in the distal cervical region from collaterals. Skeleton: Mild midcervical spondylosis. Other neck: No mass or lymphadenopathy. Upper chest: Emphysema. No focal or active process otherwise. CTA HEAD FINDINGS Anterior circulation: Both internal carotid arteries are patent through the siphon regions. No siphon stenosis. Anterior and middle cerebral vessels are patent without flow limiting proximal stenosis, aneurysm or vascular malformation. Both anterior cerebral arteries receive there supply from the left carotid circulation. I do not see any large or medium vessel occlusion, with particular  attention to  the left MCA branches. I think there may be a few M3 to M4 branches on the left that lose opacification distally and there could be a few small left MCA territory emboli. Posterior circulation: Both vertebral arteries show flow at the foramen magnum with the right being dominant. Bilateral PICA flow demonstrated. No basilar stenosis. Superior cerebellar and posterior cerebral arteries show flow. Distal PCA branches show atherosclerotic irregularity. Venous sinuses: Patent and normal. Anatomic variants: None significant otherwise. Review of the MIP images confirms the above findings IMPRESSION: Atherosclerotic disease at both carotid bifurcations but no stenosis. Prominent soft plaque at the left ICA bulb. 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 Dominant right vertebral artery widely patent. No intracranial large or medium vessel occlusion or correctable proximal stenosis, with specific attention to the left MCA branches. I do think there are a few distal MCA branches that lose opacification and there could be a few small emboli in the left MCA territory. Vessels marked with arrows. Distal vessel atherosclerotic irregularity, particularly evident in the PCA branches. Aortic Atherosclerosis (ICD10-I70.0) and Emphysema (ICD10-J43.9). Electronically Signed   By: Nelson Chimes M.D.   On: 01/18/2019 19:59   MR BRAIN WO CONTRAST  Result Date: 01/20/2019 CLINICAL DATA:  Stroke follow-up. Status post tPA administration. EXAM: MRI HEAD WITHOUT CONTRAST TECHNIQUE: Multiplanar, multiecho pulse sequences of the brain and surrounding structures were obtained without intravenous contrast. COMPARISON:  11/23/2014 FINDINGS: Brain: There is multifocal cortical ischemia within the left MCA territory. The largest area is in the left frontal lobe. There is ischemia throughout the left postcentral gyrus. There is no contralateral ischemia. Early  confluent hyperintense T2-weighted signal of the periventricular and deep white matter, most commonly due to chronic ischemic microangiopathy. There are old right occipital and cerebellar infarcts. No hydrocephalus or age advanced volume loss. Blood-sensitive sequences show no chronic microhemorrhage or superficial siderosis. No intracranial hemorrhage. Vascular: Normal flow voids. Skull and upper cervical spine: Normal marrow signal. Sinuses/Orbits: Negative. Other: None. IMPRESSION: 1. Multifocal acute ischemia within the left MCA territory, including the left postcentral gyrus. No hemorrhage or mass effect. 2. 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 3. Chronic ischemic microangiopathy. Electronically Signed   By: Ulyses Jarred M.D.   On: 01/20/2019 02:22   DG CHEST PORT 1 VIEW  Result Date: 02/04/2019 CLINICAL DATA:  68 year old male with chronic lung disease, leukocytosis. EXAM: PORTABLE CHEST 1 VIEW COMPARISON:  Portable chest 01/29/2019 and earlier. FINDINGS: Portable AP semi upright view at 0526 hours. Stable tracheostomy. Feeding tube has been removed. Stable lung volumes and ventilation with veiling bibasilar and dense retrocardiac opacity. No superimposed pneumothorax and pulmonary vascularity in the upper lobes seems to remain normal. IMPRESSION: 1. Feeding tube removed.  Stable tracheostomy. 2. Stable ventilation since 01/29/2019 with suspected bilateral pleural effusions and lower lobe collapse. Electronically Signed   By: Genevie Ann M.D.   On: 02/04/2019 07:23   DG CHEST PORT 1 VIEW  Result Date: 01/29/2019 CLINICAL DATA:  Tracheostomy in place EXAM: PORTABLE CHEST 1 VIEW COMPARISON:  01/24/2019 FINDINGS: Interval extubation and placement of tracheostomy device. Enteric tube passes into the stomach with tip out of field of view. Central line has been removed. No pneumothorax. Similar right pleural effusion and adjacent atelectasis. Increased retrocardiac density, probably atelectasis.  Stable cardiomediastinal contours. IMPRESSION: Lines and tubes as above. Similar right pleural effusion and adjacent atelectasis. Increased left basilar density, probably atelectasis. Electronically Signed   By: Macy Mis  M.D.   On: 01/29/2019 14:11   DG CHEST PORT 1 VIEW  Result Date: 01/24/2019 CLINICAL DATA:  Aspiration. EXAM: PORTABLE CHEST 1 VIEW COMPARISON:  January 21, 2019. FINDINGS: Stable cardiomediastinal silhouette. Endotracheal and nasogastric tubes are in good position. Left internal jugular catheter is unchanged. No pneumothorax is noted. Increased right pleural effusion is noted with associated right basilar atelectasis or infiltrate. Stable left basilar atelectasis is noted. Bony thorax is unremarkable. IMPRESSION: Stable support apparatus. Increased right pleural effusion is noted with associated right basilar atelectasis or infiltrate. Stable left basilar atelectasis is noted. No pneumothorax is noted. Stable left internal jugular catheter. Electronically Signed   By: Marijo Conception M.D.   On: 01/24/2019 09:19   Portable Chest x-ray  Result Date: 01/21/2019 CLINICAL DATA:  68 year old male with a history of endotracheal tube placement EXAM: PORTABLE CHEST 1 VIEW COMPARISON:  01/20/2019 FINDINGS: Cardiomediastinal silhouette unchanged. Fullness in the central vasculature. Mild interlobular septal thickening. Endotracheal tube terminates 4.9 cm above the carina. Gastric tube terminates with the side port above the GE junction. Unchanged left IJ central venous catheter with the tip appearing to terminate superior vena cava. No pneumothorax. Hazy opacities bilateral lower lungs, with blunting of the right costophrenic angle. IMPRESSION: Endotracheal tube terminates 4.9 cm over the carina, suitably position. Gastric tube terminates with the side port above the GE junction. Unchanged left IJ central venous catheter. Increased opacities at the lung bases with evidence of mild edema,  atelectasis and/or consolidation with small right pleural effusion. Electronically Signed   By: Corrie Mckusick D.O.   On: 01/21/2019 10:19   DG Chest Port 1 View  Result Date: 01/20/2019 CLINICAL DATA:  68 year old male intubated. EXAM: PORTABLE CHEST 1 VIEW COMPARISON:  Portable chest radiographs yesterday and earlier. FINDINGS: Portable AP semi upright view at 0448 hours. Endotracheal tube tip at the level the clavicles. Stable left IJ central line. Enteric tube courses to the abdomen with side hole at the level of the proximal stomach. Increasing retrocardiac opacity on the left since yesterday morning. Stable ventilation elsewhere with crowding of what are probably chronic increased interstitial markings. No pneumothorax. No definite effusion. Stable cardiac size and mediastinal contours. IMPRESSION: 1. Stable lines and tubes. 2. Increasing left lower lobe collapse or consolidation since yesterday. 3. Stable ventilation elsewhere. Electronically Signed   By: Genevie Ann M.D.   On: 01/20/2019 08:05   DG CHEST PORT 1 VIEW  Result Date: 01/19/2019 CLINICAL DATA:  Central line placement. EXAM: PORTABLE CHEST 1 VIEW COMPARISON:  01/19/2019 at 11:07 a.m. FINDINGS: Left internal jugular central venous line has its tip in the lower superior vena cava. No pneumothorax.  Lungs remain clear. Endotracheal and oral/nasogastric tubes are stable and well positioned. IMPRESSION: New left internal jugular central venous line tip projects in the lower superior vena cava. No pneumothorax. Electronically Signed   By: Lajean Manes M.D.   On: 01/19/2019 14:11   Portable Chest x-ray  Result Date: 01/19/2019 CLINICAL DATA:  69 year old male intubated, OG tube placement. EXAM: PORTABLE CHEST 1 VIEW COMPARISON:  Portable chest yesterday and earlier. FINDINGS: Portable AP semi upright view at 1111 hours. Endotracheal tube tip at the level the clavicles. Enteric tube courses to the abdomen, tip not included. Lower lung volumes.  Stable cardiac size and mediastinal contours. Mild chronic pulmonary interstitial markings appear stable from a 2018 portable chest. No superimposed pneumothorax, pleural effusion or consolidation. No acute osseous abnormality identified. Calcified aortic atherosclerosis. IMPRESSION: 1. ETT tip in good  position at the level the clavicles. Enteric tube courses to the abdomen, tip not included. 2. Lower lung volumes but otherwise no acute cardiopulmonary abnormality. Electronically Signed   By: Genevie Ann M.D.   On: 01/19/2019 11:28   DG Chest Portable 1 View  Result Date: 01/18/2019 CLINICAL DATA:  Encephalopathy EXAM: PORTABLE CHEST 1 VIEW COMPARISON:  Chest radiograph 07/17/2016 FINDINGS: There is moderate cardiomegaly. There are faint airspace opacities in the right lung base. No pleural effusion or pneumothorax. IMPRESSION: Right basilar opacities may indicate aspiration or pneumonia. Electronically Signed   By: Ulyses Jarred M.D.   On: 01/18/2019 21:36   DG Abd Portable 1V  Result Date: 01/29/2019 CLINICAL DATA:  Feeding tube placement EXAM: PORTABLE ABDOMEN - 1 VIEW COMPARISON:  None. FINDINGS: New enteric tube is within the stomach. Visualized bowel gas pattern is unremarkable. IMPRESSION: Enteric tube within the stomach. Electronically Signed   By: Macy Mis M.D.   On: 01/29/2019 14:21   DG Abd Portable 1V  Result Date: 01/21/2019 CLINICAL DATA:  Reason for exam: New OG tube placement EXAM: PORTABLE ABDOMEN - 1 VIEW COMPARISON:  Radiograph 01/21/2012 at 939 hours FINDINGS: Advancement of NG tube such that the side port is and now 6 cm below the GE junction. Tip in the gastric body. IMPRESSION: Advancement of NG tube as above. Electronically Signed   By: Suzy Bouchard M.D.   On: 01/21/2019 11:24   DG Abd Portable 1V  Result Date: 01/21/2019 CLINICAL DATA:  68 year old male with a history of gastric tube placement EXAM: PORTABLE ABDOMEN - 1 VIEW COMPARISON:  01/20/2019, 01/21/2011  FINDINGS: Limited upper abdominal plain film demonstrates gastric tube with the side-port terminating above the GE junction. Tip of the gastric tube terminates at the epigastric region. Gas in stomach and colon. IMPRESSION: Gastric tube terminates with the side port above the GE junction, with the tip in the epigastric region. Electronically Signed   By: Corrie Mckusick D.O.   On: 01/21/2019 10:26   VAS [place] UPPER EXTREMITY ARTERIAL DUPLEX  Result Date: 01/29/2019 UPPER EXTREMITY DUPLEX STUDY Indications: Patient complains of cold hand.  Other Factors: CVA, subtherapeutic INR, Atrial fibrillation. Limitations: Patient ventilation, arm positioning, and constant arm movement. Comparison Study: No prior study on file for comparison Performing Technologist: Sharion Dove RVS  Examination Guidelines: A complete evaluation includes B-mode imaging, spectral Doppler, color Doppler, and power Doppler as needed of all accessible portions of each vessel. Bilateral testing is considered an integral part of a complete examination. Limited examinations for reoccurring indications may be performed as noted.  Left Doppler Findings: +-----------+----------+-------------------+------+-------------------+ Site       PSV (cm/s)Waveform           PlaqueComments            +-----------+----------+-------------------+------+-------------------+ Subclavian           triphasic                patent              +-----------+----------+-------------------+------+-------------------+ Axillary                                      thrombus            +-----------+----------+-------------------+------+-------------------+ Brachial  thrombus            +-----------+----------+-------------------+------+-------------------+ Radial     22        dampened monophasic                          +-----------+----------+-------------------+------+-------------------+ Ulnar                                          could not visualize +-----------+----------+-------------------+------+-------------------+ Palmar Arch                                   could not visualize +-----------+----------+-------------------+------+-------------------+  Summary:  Left: There appears to be arterial thrombus noted in the left       axillary and brachial arteries. There are dampened waveforms       noted in the proximal radial artery. Unable to visualize ulnar       artery, mid to distal radial artery, or palmar arch secondary       to patient's positioning and constant movement of arm.       Incidentally, there is DVT noted in the left brachial and       axillary veins and acute SVT noted in the left cephalic and       basilic veins. *See table(s) above for measurements and observations. Electronically signed by Harold Barban MD on 01/29/2019 at 8:23:59 AM.    Final    ECHOCARDIOGRAM COMPLETE  Result Date: 01/19/2019   ECHOCARDIOGRAM REPORT   Patient Name:   SAAHAS HIDROGO Date of Exam: 01/19/2019 Medical Rec #:  [id number]         Height:       68.0 in Accession #:    [id number]        Weight:       176.4 lb Date of Birth:  01-27-1951         BSA:          1.94 m Patient Age:    49 years          BP:           72/65 mmHg Patient Gender: M                 HR:           72 bpm. Exam Location:  Inpatient Procedure: 2D Echo Indications:    Stroke 434.91/I163.9  History:        Patient has prior history of Echocardiogram examinations, most                 recent 11/25/2014. COPD, Arrythmias:Atrial Fibrillation; Risk                 Factors:Hypertension.  Sonographer:    Clayton Lefort RDCS (AE) Referring Phys: [id number] [facility]NE[facility] P [facility]  Sonographer Comments: Technically challenging study due to limited acoustic windows, Technically difficult study due to poor echo windows, suboptimal parasternal window, suboptimal apical window, suboptimal subcostal window and echo performed with patient supine and on  artificial respirator. Image acquisition challenging due to chest wall deformity. All images taken subcostally. No measurements made in 4-2-3 chambers views due to images being significantly off-axis. IMPRESSIONS  1. Left ventricular ejection fraction, by visual estimation, is 55 to 60%. The left ventricle has normal  function. There is moderately increased left ventricular hypertrophy.  2. The left ventricle demonstrates regional wall motion abnormalities.  3. Global right ventricle has normal systolic function.The right ventricular size is normal. No increase in right ventricular wall thickness.  4. Left atrial size was normal.  5. Right atrial size was mildly dilated.  6. The mitral valve is normal in structure. Mild to moderate mitral valve regurgitation.  7. The tricuspid valve is normal in structure. Tricuspid valve regurgitation is trivial.  8. The aortic valve is normal in structure. Aortic valve regurgitation is not visualized.  9. The pulmonic valve was normal in structure. Pulmonic valve regurgitation is not visualized. 10. The atrial septum is grossly normal. FINDINGS  Left Ventricle: Left ventricular ejection fraction, by visual estimation, is 55 to 60%. The left ventricle has normal function. The left ventricle demonstrates regional wall motion abnormalities. There is moderately increased left ventricular hypertrophy. Right Ventricle: The right ventricular size is normal. No increase in right ventricular wall thickness. Global RV systolic function is has normal systolic function. Left Atrium: Left atrial size was normal in size. Right Atrium: Right atrial size was mildly dilated Pericardium: There is no evidence of pericardial effusion. Mitral Valve: The mitral valve is normal in structure. Mild to moderate mitral valve regurgitation. Tricuspid Valve: The tricuspid valve is normal in structure. Tricuspid valve regurgitation is trivial. Aortic Valve: The aortic valve is normal in structure. Aortic valve  regurgitation is not visualized. Pulmonic Valve: The pulmonic valve was normal in structure. Pulmonic valve regurgitation is not visualized. Pulmonic regurgitation is not visualized. Aorta: The aortic root and ascending aorta are structurally normal, with no evidence of dilitation. IAS/Shunts: The atrial septum is grossly normal.  LEFT VENTRICLE PLAX 2D LVIDd:         4.10 cm LVIDs:         3.40 cm LV PW:         1.50 cm LV IVS:        1.60 cm LVOT diam:     2.00 cm LV SV:         27 ml LV SV Index:   13.57 LVOT Area:     3.14 cm  IVC IVC diam: 2.70 cm LEFT ATRIUM         Index LA diam:    3.50 cm 1.81 cm/m   AORTA Ao Root diam: 3.60 cm TRICUSPID VALVE TR Peak grad:   11.0 mmHg TR Vmax:        166.00 cm/s  SHUNTS Systemic Diam: 2.00 cm  Mertie Moores MD Electronically signed by Mertie Moores MD Signature Date/Time: 01/19/2019/4:52:07 PM    Final    CT HEAD CODE STROKE WO CONTRAST  Result Date: 01/18/2019 CLINICAL DATA:  Code stroke.  Right facial droop EXAM: CT HEAD WITHOUT CONTRAST TECHNIQUE: Contiguous axial images were obtained from the base of the skull through the vertex without intravenous contrast. COMPARISON:  MRI 11/23/2014 FINDINGS: Brain: Old right lateral cerebellar infarction. Old right occipital infarction. Chronic small-vessel ischemic changes of the cerebral hemispheric white matter. No sign of acute infarction, mass lesion, hemorrhage, hydrocephalus or extra-axial collection. Vascular: There is atherosclerotic calcification of the major vessels at the base of the brain. Skull: Negative Sinuses/Orbits: Clear Other: Right frontal scalp hematoma. ASPECTS [facility] Stroke Program Early CT Score) - Ganglionic level infarction (caudate, lentiform nuclei, internal capsule, insula, M1-M3 cortex): 7 - Supraganglionic infarction (M4-M6 cortex): 3 Total score (0-10 with 10 being normal): 10 IMPRESSION: 1. No acute finding by  CT. Chronic small-vessel ischemic changes of the white matter. Old right  cerebellar and right occipital infarctions. 2. ASPECTS is 10 3. These results were communicated to Dr. Leonel Ramsay at 7:33 pmon 12/10/2020by text page via the [facility] messaging system. Electronically Signed   By: Nelson Chimes M.D.   On: 01/18/2019 19:34    ASSESSMENT AND PLAN:  1.  Thrombocytosis 2.  Acute stroke with distal branch occlusions of left MCA 3.  Acute respiratory failure secondary to CVA, status post tracheostomy 4.  Atrial fibrillation with RVR  -Thrombocytosis likely reactive as he had a completely normal platelet count on admission and it was also normal 2 years ago.  However, will obtain additional lab work to rule out other causes including transferrin, reticulocytes, ferritin, iron TIBC, folate, and vitamin B12.  Peripheral blood smear has been requested for review. -Anticipate that his platelet count will start to decline within the next 1 week.  Recommend continued monitoring.  He is already on anticoagulation with Eliquis.   Thank you for this referral.  Mikey Bussing, DNP, AGPCNP-BC, AOCNP  ADDENDUM: I saw and examined [name].  Unfortunately, he is really in poor shape for any thing that is going to be invasive.  I think the blood smear is the key to figure out what is going on with him.  He has marked increase in platelet count.  He has large platelets.  He has a few giant platelets.  For some reason, I have to believe that he has now developed an underlying myeloproliferative condition-likely essential thrombocythemia.  I think the best way that we can try diagnosis with him is to send off a JAK 2 assay.  I will also send off a Calreticulin assay.  Ideally, a bone marrow biopsy would be done but he is in such a tough shape that I do not think we have to put him through an invasive procedure.  I have noted that his iron levels are little on the low side.  His iron saturation is 13%.  We can give him some IV iron and see if this can help a little bit.  Of  note, he also has Enterobacter and Streptococcus in his respiratory secretions.  I know these could typically cause thrombocytopenia but I suppose thrombocytosis is also possible.  I think that it would be reasonable to try him on Hydrea.  We could to this empirically for right now.  It is obvious that his overall prognosis from the stroke is just not good.  He really does not interact much.  He does not have movement I think of his right arm.  He has a tracheostomy because of a prolonged difficult ventilation weaning.  I really feel bad for [name].  I just hate to see him exist as he is doing.  Thank you so much for allowing [place] to see him.  We will follow along and try to help out in any way that we can.  Lattie Haw, MD  [place] 12:12

## 2019-02-05 NOTE — Progress Notes (Addendum)
STROKE TEAM PROGRESS NOTE   INTERVAL HISTORY Pt lying in bed, still on trach collar, tolerating well.  Still has global aphasia, right arm plegic and right leg paresis.  Moving left arm purposefully.  No acute event overnight.  OBJECTIVE Vitals:   02/05/19 0900 02/05/19 1000 02/05/19 1100 02/05/19 1116  BP: (!) 141/90 (!) 146/105 (!) 131/104   Pulse: 99 (!) 105 95   Resp: 20 (!) 22    Temp:    (!) 97.5 F (36.4 C)  TempSrc:    Axillary  SpO2: 97% 96% 97%   Weight:      Height:        CBC:  Recent Labs  Lab 02/04/19 0250 02/05/19 0303  WBC 13.2* 12.6*  HGB 11.9* 13.3  HCT 35.0* 38.0*  MCV 102.3* 100.5*  PLT 843* 914*    Basic Metabolic Panel:  Recent Labs  Lab 02/02/19 0317 02/03/19 0349 02/05/19 0303  NA 138 139 136  K 3.7 3.5 3.8  CL 103 104 97*  CO2 25 27 28   GLUCOSE 95 92 101*  BUN 19 17 19   CREATININE 0.62 0.61 0.77  CALCIUM 8.4* 8.2* 9.0  MG 2.1  --  1.9  PHOS 2.8  --  3.8    Lipid Panel:     Component Value Date/Time   CHOL 238 (H) 01/19/2019 0437   TRIG 64 01/19/2019 0437   HDL 43 01/19/2019 0437   CHOLHDL 5.5 01/19/2019 0437   VLDL 13 01/19/2019 0437   LDLCALC 182 (H) 01/19/2019 0437   HgbA1c:  Lab Results  Component Value Date   HGBA1C 5.1 01/19/2019   Urine Drug Screen:     Component Value Date/Time   LABOPIA NONE DETECTED 07/13/2016 1428   COCAINSCRNUR NONE DETECTED 07/13/2016 1428   LABBENZ POSITIVE (A) 07/13/2016 1428   AMPHETMU NONE DETECTED 07/13/2016 1428   THCU POSITIVE (A) 07/13/2016 1428   LABBARB NONE DETECTED 07/13/2016 1428    Alcohol Level     Component Value Date/Time   ETH <5 02/09/2015 0010    IMAGING  CT HEAD WO CONTRAST 01/24/2019 IMPRESSION: Involving acute/subacute infarcts in the left frontal parietal lobe and left occipital lobe. Small amount of hemorrhage in the left posterior frontal infarct unchanged from prior CT and MRI. Electronically Signed   By: Franchot Gallo M.D.   On: 01/24/2019 16:18    MRI Wo Contrast  01/20/2019 IMPRESSION: 1. Multifocal acute ischemia within the left MCA territory, including the left postcentral gyrus. No hemorrhage or mass effect. 2. 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 3. Chronic ischemic microangiopathy.  CT Head Wo Contrast  01/19/2019 IMPRESSION: Interval demarcation of an acute cortically based infarct within the lateral left frontal lobe as described. No acute intracranial hemorrhage or significant mass effect.  CT Code Stroke CTA Head W/WO contrast CT Code Stroke CTA Neck W/WO contrast 01/18/2019 IMPRESSION:  Atherosclerotic disease at both carotid bifurcations but no stenosis.  Prominent soft plaque at the left ICA bulb.  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  Dominant right vertebral artery widely patent.  No intracranial large or medium vessel occlusion or correctable proximal stenosis, with specific attention to the left MCA branches.  I do think there are a few distal MCA branches that lose opacification and there could be a few small emboli in the left MCA territory. Vessels marked with arrows.  Distal vessel atherosclerotic irregularity, particularly evident in the PCA branches.  Aortic  Atherosclerosis (ICD10-I70.0)  Emphysema (ICD10-J43.9).   CT HEAD CODE STROKE WO CONTRAST 01/18/2019  IMPRESSION: 1. No acute finding by CT. Chronic small-vessel ischemic changes of the white matter. Old right cerebellar and right occipital infarctions.  2. ASPECTS is 10   Portable Chest 1 View 02/04/19 Stable lung volumes and ventilation with veiling bibasilar and dense retrocardiac opacity. No superimposed pneumothorax and pulmonary vascularity in the upper lobes seems to remain normal. IMPRESSION: 1. Feeding tube removed.  Stable tracheostomy. 2. Stable ventilation since 01/29/2019 with suspected bilateral pleural effusions and lower lobe  collapse.  Transthoracic Echocardiogram  01/19/2019 IMPRESSIONS  1. Left ventricular ejection fraction, by visual estimation, is 55 to 60%. The left ventricle has normal function. There is moderately increased left ventricular hypertrophy.  2. The left ventricle demonstrates regional wall motion abnormalities.  3. Global right ventricle has normal systolic function.The right ventricular size is normal. No increase in right ventricular wall thickness.  4. Left atrial size was normal.  5. Right atrial size was mildly dilated.  6. The mitral valve is normal in structure. Mild to moderate mitral valve regurgitation.  7. The tricuspid valve is normal in structure. Tricuspid valve regurgitation is trivial.  8. The aortic valve is normal in structure. Aortic valve regurgitation is not visualized.  9. The pulmonic valve was normal in structure. Pulmonic valve regurgitation is not visualized. 10. The atrial septum is grossly normal.  ECG atrial fibrillation - ventricular response 133 BPM (See cardiology reading for complete details)  PHYSICAL EXAM  Blood pressure (!) 131/104, pulse 95, temperature (!) 97.5 F (36.4 C), temperature source Axillary, resp. rate (!) 22, height 5' 8.5" (1.74 m), weight 72.2 kg, SpO2 97 %.  General - frail elderly Caucasian male, on trach collar now  Ophthalmologic - fundi not visualized due to noncooperation.  Cardiovascular - irregularly irregular heart rate and rhythm.  Neuro - pt lying in bed, on trach collar, and PEG in place, tolerating well. He is lethargic but able to open eyes spontaneously. Not following commands. Eyes left gaze preference, barely cross midline. Inconsistently blinking to visual threat bilaterally. Mouthing words but intelligible. PERRL. Right facial droop. Tongue protrusion not cooperative. Left UE at least 4/5, purposeful movement against gravity. RUE flaccid. BLEs withdraw to pain, 3/5 on the left and 3-/5 on the right. DTR 1+ and  bilateral babinski. Sensation, coordination and gait not tested.   ASSESSMENT/PLAN [name] is a 68 y.o. male with history of tobacco use, previous strokes, COPD, AAA, ASPVD, htn, hld, pulmonary nodule, depression, atrial fibrillation on coumadin but recently missed doses presenting with transient aphasia and leaning to the right. The patient received IV t-PA Thursday 01/18/19 @ 2000.  Stroke - left MCA infarct s/p t-PA - embolic pattern, likely due to atrial fibrillation with subtherapeutic INR  Resultant global aphasia, left gaze, right hemiparesis, right neglect and agitation  Code Stroke CT Head - No acute finding by CT. Chronic small-vessel ischemic changes of the white matter. Old right cerebellar and right occipital infarctions. ASPECTS is 10   CT head - Interval demarcation of an acute cortically based infarct within the lateral left frontal lobe as described. No acute intracranial hemorrhage or significant mass effect.  MRI head -  Multifocal acute ischemia within the left MCA territory, including the left postcentral gyrus. No hemorrhage or mass effect. 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 Chronic ischemic microangiopathy.  CTA H&N - Prominent soft plaque at the left ICA bulb. Left vertebral artery occluded at  its origin and reconstituted in the distal cervical region by collaterals.   CT 12/16 stable, continued evolution of left MCA infarcts  2D Echo - EF 55 to 60%. No cardiac source of emboli identified.   [employer] Virus 2 - negative  LDL - 182  HgbA1c - 5.1  VTE prophylaxis -Heparin subq  warfarin daily prior to admission, now on Eliquis  Therapy recommendations: pending    Disposition:  Pending  Afib with RVR  On amiodarone and Cardizem po  On metoprolol 100 bid   Not compliant with warfarin PTA, INR 1.1 on admission INR 1.2 on 02/02/2019  On heparin IV and coumadin  Switched to Eliquis 12/26  Respiratory failure with  hypoxia  Intubated -> s/p trach 12/21 -> trach collar 12/27  Off vent 12/27  CCM on board  OK to transfer to step down  B12 deficiency  Vitamin B12 = 161  B12 supplement  History of stroke  11/2014, bilateral blurry vision after left upper extremity bypass surgery.  Found to have left central vision loss.  MRI showed bilateral embolic infarcts.  INR 1.45.  Carotid Doppler negative.  TTE unremarkable.  LDL 82, A1c 5.4.  Continued on discharge with Coumadin and Lipitor 40  Hypotension, resolved Hx of hypertension  Home BP meds: Diltiazem, Lasix and metoprolol  Current BP meds clonidine 0.3 q 6h , metoprolol 25 bid  BP stable  Off neo . Avoid low BP  Hyperlipidemia  Home Lipid lowering medication: Lipitor 40 mg daily  LDL 182, goal < 70  Current lipid lowering medication: Lipitor 80 mg daily   Continue statin at discharge  Dysphagia  N.p.o.  On bolus TF now  S/p PEG 12/22  Speech following  On FW 200cc Q8h -> d/c'd 12/28 by CCM (received 40mg  IV lasix today)  VAP Fever and leukocytosis  T-max 101.7->100.2->100.4->101.5->99.6->99.3->97.7->97.5   Leucocytosis - 7.8->9.0->15.6->16.9->15.6->13.2->12.6   CXR 02/04/19 - Stable ventilation since 01/29/2019 with suspected bilateral pleural effusions and lower lobe collapse.  UA neg 01/18/19  On cefepime (started 01/26/19 x 7 days) for S.pneumoniae and E.cloacae in sputum ->now off all antibiotics  CCM on board  Tobacco abuse  Current smoker  Smoking cessation counseling will be provided  Pt is willing to quit  Thrombocytosis  Platelet 750->828->843->914    Hematology consulted  Labs pending  Other Stroke Risk Factors  Advanced age  ETOH use, advised to drink no more than 1 alcoholic beverage per day.  Other Active Problems  Hypokalemia 3.3->3.4->3.3->3.1->4.0->4.3->4.4->4.4->4.0->3.4->3.7->3.5->3.8    Oral thrush on nystatin   Palliative Care consult 12/25 -> brother wants  aggressive care -> (Dr Leonie Man contacted [place] per family request; however,  [facility] would not agree to accept transfer)    Hospital day # 18  This patient is critically ill and at significant risk of neurological worsening, death and care requires constant monitoring of vital signs, hemodynamics,respiratory and cardiac monitoring, extensive review of multiple databases, frequent neurological assessment, discussion with family, other specialists and medical decision making of high complexity. I spent 35 minutes of neurocritical care time  in the care of  this patient. I discussed with Dr. Lissa Merlin. I had long discussion with brother over the phone, updated pt current condition, treatment plan and potential prognosis, and answered all the questions. He expressed understanding and appreciation.    Rosalin Hawking, MD PhD Stroke Neurology 02/05/2019 11:32 AM

## 2019-02-06 DIAGNOSIS — L8990 Pressure ulcer of unspecified site, unspecified stage: Secondary | ICD-10-CM | POA: Insufficient documentation

## 2019-02-06 DIAGNOSIS — Z430 Encounter for attention to tracheostomy: Secondary | ICD-10-CM

## 2019-02-06 LAB — CBC
HCT: 40.9 % (ref 39.0–52.0)
Hemoglobin: 13.6 g/dL (ref 13.0–17.0)
MCH: 34.2 pg — ABNORMAL HIGH (ref 26.0–34.0)
MCHC: 33.3 g/dL (ref 30.0–36.0)
MCV: 102.8 fL — ABNORMAL HIGH (ref 80.0–100.0)
Platelets: 925 10*3/uL (ref 150–400)
RBC: 3.98 MIL/uL — ABNORMAL LOW (ref 4.22–5.81)
RDW: 14.6 % (ref 11.5–15.5)
WBC: 12.6 10*3/uL — ABNORMAL HIGH (ref 4.0–10.5)
nRBC: 0 % (ref 0.0–0.2)

## 2019-02-06 LAB — GLUCOSE, CAPILLARY
Glucose-Capillary: 107 mg/dL — ABNORMAL HIGH (ref 70–99)
Glucose-Capillary: 118 mg/dL — ABNORMAL HIGH (ref 70–99)
Glucose-Capillary: 86 mg/dL (ref 70–99)
Glucose-Capillary: 89 mg/dL (ref 70–99)
Glucose-Capillary: 91 mg/dL (ref 70–99)
Glucose-Capillary: 97 mg/dL (ref 70–99)

## 2019-02-06 LAB — BASIC METABOLIC PANEL
Anion gap: 11 (ref 5–15)
BUN: 22 mg/dL (ref 8–23)
CO2: 26 mmol/L (ref 22–32)
Calcium: 9.4 mg/dL (ref 8.9–10.3)
Chloride: 101 mmol/L (ref 98–111)
Creatinine, Ser: 0.82 mg/dL (ref 0.61–1.24)
GFR calc Af Amer: >60 mL/min (ref >60)
GFR calc non Af Amer: >60 mL/min (ref >60)
Glucose, Bld: 95 mg/dL (ref 70–99)
Potassium: 4.2 mmol/L (ref 3.5–5.1)
Sodium: 138 mmol/L (ref 135–145)

## 2019-02-06 MED ORDER — IPRATROPIUM-ALBUTEROL 0.5-2.5 (3) MG/3ML IN SOLN: 3.0000 mL | Freq: Four times a day (QID) | RESPIRATORY_TRACT | Status: DC | PRN

## 2019-02-06 MED ORDER — PRO-STAT SUGAR FREE PO LIQD
30.0000 mL | Freq: Every day | ORAL | Status: DC
  Administered 2019-02-07 – 2019-02-20 (×13): 30 mL
  Filled 2019-02-06 (×13): qty 30

## 2019-02-06 MED ORDER — OSMOLITE 1.5 CAL PO LIQD
474.0000 mL | Freq: Three times a day (TID) | ORAL | Status: DC
  Administered 2019-02-06: 237 mL
  Administered 2019-02-07 – 2019-02-21 (×40): 474 mL
  Filled 2019-02-06 (×47): qty 474

## 2019-02-06 MED ORDER — FUROSEMIDE 20 MG PO TABS
20.0000 mg | ORAL_TABLET | Freq: Every day | ORAL | Status: DC
  Administered 2019-02-06 – 2019-02-16 (×11): 20 mg via ORAL
  Filled 2019-02-06 (×11): qty 1

## 2019-02-06 NOTE — Evaluation (Addendum)
Physical Therapy Evaluation Patient Details Name: [name] MRN: [id number] DOB: [birth year] Today's Date: 02/06/2019   History of Present Illness  22yM admitted 01/20/19 with hypertensive emergency in setting of acute L MCA stroke s/p tPA, AF/RVR.  Has been noncompliant with his warfarin for A. Fib. Intubated for respiratory failure. Trach 01/29/19 with PEG planned for 01/30/19. No sedation at time of PT eval.   Clinical Impression   Pt admitted with/for L MCA stroke s/p tPA.  Pt has not responded as expect after sedation lifted.  Pt is now more aroused, but not very purposeful in movement and follows no commands.  Pt needing max to total of 2 person for basic mobility.  Pt currently limited functionally due to the problems listed. ( See problems list.)   Pt will benefit from PT to maximize function and safety in order to get ready for next venue listed below.     Follow Up Recommendations SNF;Supervision/Assistance - 24 hour;Other (comment)(will have better recs as pt can participate more)    Equipment Recommendations  Other (comment)(TBA)    Recommendations for Other Services       Precautions / Restrictions Precautions Precautions: Fall;Other (comment) Precaution Comments: multiple lines, on TC with PMV for short periods      Mobility  Bed Mobility Overal bed mobility: Needs Assistance Bed Mobility: Rolling;Sidelying to Sit;Sit to Sidelying Rolling: Max assist Sidelying to sit: Max assist;+2 for physical assistance     Sit to sidelying: Total assist;+2 for physical assistance General bed mobility comments: scooted to EOB with total, otherwise needed significant assist at trunk and guiding for technique  Transfers Overall transfer level: Needs assistance Equipment used: None Transfers: Sit to/from Stand Sit to Stand: Max assist         General transfer comment: face to face assist with R LE block/guard.  R with weak w/bearing and most weight on the L  LE.  Ambulation/Gait             General Gait Details: unable on eval  Stairs            Wheelchair Mobility    Modified Rankin (Stroke Patients Only) Modified Rankin (Stroke Patients Only) Modified Rankin: Severe disability     Balance Overall balance assessment: Needs assistance Sitting-balance support: Single extremity supported;Bilateral upper extremity supported Sitting balance-Leahy Scale: Poor Sitting balance - Comments: mod to min guard with varying UE assist.  Little pt focus on task.     Standing balance-Leahy Scale: Poor Standing balance comment: face to face standing assist                             Pertinent Vitals/Pain Pain Assessment: Faces Faces Pain Scale: No hurt Pain Intervention(s): Monitored during session    Home Living Family/patient expects to be discharged to:: Unsure                 Additional Comments: per chart, brother wants aggressive care    Prior Function           Comments: unknown at time of re evaluation     Hand Dominance   Dominant Hand: Right    Extremity/Trunk Assessment   Upper Extremity Assessment Upper Extremity Assessment: RUE deficits/detail;LUE deficits/detail RUE Deficits / Details: no spontaneous movement noted. LUE Deficits / Details: moves spontaneously and inconsistently to command.  Sometimes purposeful LUE Coordination: decreased fine motor    Lower Extremity Assessment Lower Extremity Assessment: RLE  deficits/detail;LLE deficits/detail RLE Deficits / Details: moves weakly spontaneously, synergistic, no isolated movement noted. LLE Deficits / Details: moves against gravity, generally purposeful, full w/bearing in stance. LLE Coordination: decreased fine motor    Cervical / Trunk Assessment Cervical / Trunk Exceptions: head sidebent to his right.  Stiff with cervical extension or lying back on the pillow.  Communication   Communication: Tracheostomy;Other (comment)   Cognition Arousal/Alertness: Lethargic;Awake/alert Behavior During Therapy: Flat affect Overall Cognitive Status: Impaired/Different from baseline Area of Impairment: Orientation;Attention;Memory;Following commands;Safety/judgement;Awareness;Problem solving                 Orientation Level: Place;Time;Situation(located me to "[name]") Current Attention Level: Focused   Following Commands: Follows one step commands inconsistently Safety/Judgement: Decreased awareness of safety;Decreased awareness of deficits Awareness: Intellectual Problem Solving: Slow processing;Decreased initiation;Difficulty sequencing;Requires verbal cues;Requires tactile cues General Comments: pt responded to his name inconsistently and responded with "GD" to perceived uncomfortable stimulus.  ie  going down on L elbow to lay down.      General Comments General comments (skin integrity, edema, etc.): pt followed no commands.  Perseverated on "GD"     Exercises     Assessment/Plan    PT Assessment Patient needs continued PT services  PT Problem List Decreased strength;Decreased activity tolerance;Decreased balance;Decreased mobility;Decreased coordination;Decreased safety awareness       PT Treatment Interventions Gait training;Functional mobility training;Therapeutic activities;DME instruction;Balance training;Neuromuscular re-education;Patient/family education    PT Goals (Current goals can be found in the Care Plan section)  Acute Rehab PT Goals PT Goal Formulation: Patient unable to participate in goal setting Time For Goal Achievement: 02/20/19 Potential to Achieve Goals: Fair    Frequency Min 2X/week   Barriers to discharge        Co-evaluation               AM-PAC PT "6 Clicks" Mobility  Outcome Measure Help needed turning from your back to your side while in a flat bed without using bedrails?: Total Help needed moving from lying on your back to sitting on the side of a flat  bed without using bedrails?: Total Help needed moving to and from a bed to a chair (including a wheelchair)?: Total Help needed standing up from a chair using your arms (e.g., wheelchair or bedside chair)?: Total Help needed to walk in hospital room?: Total Help needed climbing 3-5 steps with a railing? : Total 6 Click Score: 6    End of Session Equipment Utilized During Treatment: Other (comment)(TC) Activity Tolerance: Patient tolerated treatment well Patient left: in bed;with bed alarm set;with SCD's reapplied Nurse Communication: Mobility status PT Visit Diagnosis: Hemiplegia and hemiparesis;Other abnormalities of gait and mobility (R26.89);Other symptoms and signs involving the nervous system (R29.898) Hemiplegia - Right/Left: Right Hemiplegia - caused by: Cerebral infarction    Time: [id number] PT Time Calculation (min) (ACUTE ONLY): 27 min   Charges:   PT Evaluation $PT Eval High Complexity: 1 High PT Treatments $Therapeutic Activity: 8-22 mins        02/06/2019  [name], PT Acute Rehabilitation Services [phone]  (pager) [phone]  (office)  [name] Margherita Collyer 02/06/2019, 5:45 PM

## 2019-02-06 NOTE — Progress Notes (Signed)
Nutrition Follow-up  DOCUMENTATION CODES:   Severe malnutrition in context of chronic illness  INTERVENTION:  Increase bolus tube feeds using Osmolite 1.5 formula via PEG. Start at 237 ml (1 carton/ARC) and then increase by 120 ml (half carton/ARC) at each feeding to new goal volume of 474 ml (2 cartons/ARCS) given TID.   Provide 30 ml Prostat once daily.   Tube feeding regimen to provide 2233 kcal (100% of needs), 104 grams of protein, and 1081 ml of free water.   NUTRITION DIAGNOSIS:   Severe Malnutrition related to chronic illness(COPD) as evidenced by severe fat depletion, severe muscle depletion; ongoing  GOAL:   Patient will meet greater than or equal to 90% of their needs; met with TF  MONITOR:   TF tolerance, Weight trends, Skin, Labs, I & O's  REASON FOR ASSESSMENT:   Consult, Ventilator Enteral/tube feeding initiation and management  ASSESSMENT:   68 yo male admitted with hypertensive emergency in setting of acute L MCA stroke, required intubation for airway protection, pt choking on own secretions. PMH includes COPD, AF, CVA, PAD.  12/21 S/P tracheostomy  12/22 PEG placed   Pt has been tolerating trach collar since 12/27. Pt continues on NPO status. Per SLP, pt has not been accepting of po and has reduced secretion management. Pt has been tolerating his tube feeds well via PEG. RD to modify tube feeding orders to better meet nutrition needs as pt no longer on vent support. RD to continue to monitor for tolerance. Labs and medications reviewed.   Diet Order:   Diet Order            Diet NPO time specified  Diet effective now              EDUCATION NEEDS:   Not appropriate for education at this time  Skin:  Skin Assessment: Skin Integrity Issues: Skin Integrity Issues:: Stage II Stage II: sacrum  Last BM:  12/28  Height:   Ht Readings from Last 1 Encounters:  01/28/19 5' 8.5" (1.74 m)    Weight:   Wt Readings from Last 1 Encounters:   02/06/19 73.3 kg    Ideal Body Weight:  70 kg  BMI:  Body mass index is 24.21 kg/m.  Estimated Nutritional Needs:   Kcal:  2841-3244  Protein:  100-120 grams  Fluid:  >2L    Corrin Parker, MS, RD, LDN Pager # [phone] After hours/ weekend pager # [phone]

## 2019-02-06 NOTE — Evaluation (Signed)
Clinical/Bedside Swallow Evaluation Patient Details  Name: [name] MRN: [id number] Date of Birth: May 13, 1950  Today's Date: 02/06/2019 Time: SLP Start Time (ACUTE ONLY): 33 SLP Stop Time (ACUTE ONLY): 1530 SLP Time Calculation (min) (ACUTE ONLY): 9 min  Past Medical History:  Past Medical History:  Diagnosis Date  . AAA (abdominal aortic aneurysm) ([place])    per last CT 03-26-2016---  3.9cm  . Anticoagulated on Coumadin   . Arthritis of spine   . Benign localized prostatic hyperplasia with lower urinary tract symptoms (LUTS)   . Chronic atrial fibrillation ([place]) cardiolgoist -- dr Stanford Breed   dx 2009  . Complication of anesthesia    per pt today (07-08-2016) had problem after having anesthesia 12/ 2016 surgery at [facility] "not good" went to ED ( in epic ED visit 01/ 2017 dx severe episode of major depression disorder and admitted to [facility]  . COPD with emphysema ([place])   . Dyspnea on exertion   . Full dentures   . GERD (gastroesophageal reflux disease)   . Hematuria   . History of amaurosis fugax    right eye 02-08-2015 -- resolved /  documented in epic possible left eye amaurosis fugax 11/ 2008 (pt denies) / per last MRI show previous bilateral occipital lobe infarcts  . History of concussion    as child  . History of CVA (cerebrovascular accident) 10/ 2016  and per pt Dec 2017 in [place]   neurologist-  dr Leonie Man-- per note silent infarcts on MRI-- multiple acute infarcts bilateral cerebullm, right temporal lobe, and bilateral occipital lobes due to embolic phenomena (atrial fib.)  . History of seizures as a child   . Hyperlipidemia   . Hypertension   . Left arm numbness    occasional numbess post residual axilla arterial occlusion and surgery  . Major depressive disorder    hx severe episode without psychosis 02/2015 w/ homicidal ideation  . Narcotic dependence ([place])   . PAD (peripheral artery disease) ([place])    followed by dr fields-- s/p  left axilla to branchial and  left axilla to ulnar bypass graft due to ischemic hand  . Pulmonary nodule    per CT 03-26-2016  right lower lobe  . PVD (peripheral vascular disease) ([place])   . Right ureteral stone   . Senile purpura ([place])   . Weakness of left arm    residual from axilla arterial occlusion and post surgery  . Wears glasses    Past Surgical History:  Past Surgical History:  Procedure Laterality Date  . ARCH AORTOGRAM N/A 04/05/2014   Procedure: ARCH AORTOGRAM, FIRST ORDER CATHETERIZATION LEFT SUBCLAVIAN ARTERY;  Surgeon: Elam Dutch, MD;  Location: [facility] OR;  Service: Vascular;  Laterality: N/A;  . AXILLARY-FEMORAL BYPASS GRAFT Left 04/08/2014   Procedure: LEFT AXILLARY ARTERY TO BRACHIAL ARTERY BYPASS USING NON REVERSE LEFT GREATER SAPHENOUS VEIN ,LIGATION OF LEFT AXILLARY ARTERY ANEURYSM;  Surgeon: Elam Dutch, MD;  Location: [place];  Service: Vascular;  Laterality: Left;  . BYPASS AXILLA/BRACHIAL ARTERY Left 01/27/2015   Procedure: LEFT BRACHIAL-ULAR ARTERY BYPASS USING GREATER SAPHENOUS VEIN;  Surgeon: Elam Dutch, MD;  Location: [place];  Service: Vascular;  Laterality: Left;  . CYSTOSCOPY WITH RETROGRADE PYELOGRAM, URETEROSCOPY AND STENT PLACEMENT Right 07/17/2016   Procedure: CYSTOSCOPY WITH RETROGRADE PYELOGRAM, URETEROSCOPY AND STENT PLACEMENT,LASER;  Surgeon: Festus Aloe, MD;  Location: [facility];  Service: Urology;  Laterality: Right;  . CYSTOSCOPY/URETEROSCOPY/HOLMIUM LASER/STENT PLACEMENT Right 07/13/2016   Procedure: CYSTOSCOPY, RIGHT URETEROSCOPY RETROGRADE PYELOGRAM;  Surgeon: Kathie Rhodes, MD;  Location: [facility];  Service: Urology;  Laterality: Right;  . EMBOLECTOMY Left 04/05/2014   Procedure: THROMBO ENDARTERECTOMY OF LEFT BRACHIAL, RADIAL AND ULNAR ARTERY,  Left Radial artery cut down and radial artery thrombectomy.;  Surgeon: Elam Dutch, MD;  Location: [facility] OR;  Service: Vascular;  Laterality: Left;  . ESOPHAGOGASTRODUODENOSCOPY N/A 01/30/2019   Procedure:  ESOPHAGOGASTRODUODENOSCOPY (EGD);  Surgeon: Jesusita Oka, MD;  Location: [facility] ENDOSCOPY;  Service: General;  Laterality: N/A;  . facial cyst Right    cyst removal.   . PATCH ANGIOPLASTY Left 04/05/2014   Procedure: LEFT ARM VEIN PATCH ANGIOPLASTY;  Surgeon: Elam Dutch, MD;  Location: [place];  Service: Vascular;  Laterality: Left;  . PEG PLACEMENT N/A 01/30/2019   Procedure: PERCUTANEOUS ENDOSCOPIC GASTROSTOMY (PEG) PLACEMENT;  Surgeon: Jesusita Oka, MD;  Location: [place];  Service: General;  Laterality: N/A;  . PERIPHERAL VASCULAR CATHETERIZATION N/A 01/22/2015   Procedure: Aortic Arch Angiography;  Surgeon: Elam Dutch, MD;  Location: [place] CV LAB;  Service: Cardiovascular;  Laterality: N/A;  . THROMBECTOMY BRACHIAL ARTERY Left 11/20/2014   Procedure: 1.  Thrombectomy Left Axilo-Brachial Bypass  2.  Thromboendarterecotmy of Left Brachial Artery with Fogarty Thrombectomy of Radial and Ulnar Arteries with Dacron patch angioplasty Left Brachial Artery. 3. Intraoperative  Arteriogram times four.;  Surgeon: Mal Misty, MD;  Location: [place];  Service: Vascular;  Laterality: Left;  . TRANSTHORACIC ECHOCARDIOGRAM  11/25/2014   moderate concentric LVH, ef 60-65%, unable to evaluation LVDF due to atrial fib/  mild MR/  severe LAE  . VEIN HARVEST Left 04/08/2014   Procedure: LEFT GREATER SAPHENOUS VEIN HARVEST;  Surgeon: Elam Dutch, MD;  Location: [place];  Service: Vascular;  Laterality: Left;  Marland Kitchen VEIN HARVEST Right 01/27/2015   Procedure: RIGHT GREATER SAPHENOUS VEIN HARVEST;  Surgeon: Elam Dutch, MD;  Location: [place];  Service: Vascular;  Laterality: Right;   HPI:  Pt is a 68 y/o M admitted with L MCA CVA s/p TPA. He was intubated 12/11-12/13, reintubated 12/13; trach 12/21. PMH: tobacco use, previous strokes, GERD, COPD, AAA, PVD, HTN, HLD, pulmonary nodule, depression, afib (recently missed doses of coumadin)   Assessment / Plan / Recommendation Clinical  Impression  Pt has reduced secretion management with suspected secretions in pharynx/larynx at baseline given audible wet voice. He does not cough or swallow to command but will expectorate secretions intermittently; at other times, he tries to swallow them. Pt consumed minimal amounts of water via swab only, as he refused any other bolus delivery method (and refused ice chips as well). He would bite down on the swab to access the water, but then did not swallow despite cues. He had a delayed cough concerning for premature spillage toward the airway. Would remain NPO for now with use of PEG. Will continue to follow to determine if pt could do MBS with increase in bolus acceptance.  SLP Visit Diagnosis: Dysphagia, oropharyngeal phase (R13.12)    Aspiration Risk  Severe aspiration risk    Diet Recommendation NPO;Alternative means - long-term   Medication Administration: Via alternative means    Other  Recommendations Oral Care Recommendations: Oral care QID Other Recommendations: Have oral suction available   Follow up Recommendations (tba)      Frequency and Duration min 2x/week  2 weeks       Prognosis Prognosis for Safe Diet Advancement: Fair Barriers to Reach Goals: Severity of deficits  Swallow Study   General HPI: Pt is a 68 y/o M admitted with L MCA CVA s/p TPA. He was intubated 12/11-12/13, reintubated 12/13; trach 12/21. PMH: tobacco use, previous strokes, GERD, COPD, AAA, PVD, HTN, HLD, pulmonary nodule, depression, afib (recently missed doses of coumadin) Type of Study: Bedside Swallow Evaluation Previous Swallow Assessment: none in chart Diet Prior to this Study: NPO;PEG tube Temperature Spikes Noted: No Respiratory Status: Trach;Trach Collar Trach Size and Type: Cuff;#6;Deflated;With PMSV in place History of Recent Intubation: Yes(see HPI) Behavior/Cognition: Alert Oral Care Completed by SLP: Yes Self-Feeding Abilities: Total assist Patient Positioning: Upright  in bed Baseline Vocal Quality: Wet Volitional Cough: Cognitively unable to elicit Volitional Swallow: Unable to elicit    Oral/Motor/Sensory Function     Ice Chips Ice chips: (pt refused)   Thin Liquid Thin Liquid: Impaired Presentation: (swab - refused anything else)    Nectar Thick Nectar Thick Liquid: Not tested   Honey Thick Honey Thick Liquid: Not tested   Puree Puree: Not tested   Solid     Solid: Not tested       Osie Bond., M.A. [place] Pager [phone] Office [phone]  02/06/2019,5:08 PM

## 2019-02-06 NOTE — Progress Notes (Signed)
HEMATOLOGY-ONCOLOGY PROGRESS NOTE  SUBJECTIVE: The patient has been transferred out of the ICU. He is more awake today and seems to be trying to speak, however, unable to understand him. He is moving his left arm and able to move his feet. Nursing reports no concerns today. His roommate is at the bedside.  REVIEW OF SYSTEMS:   Unable to obtain secondary to patient condition  I have reviewed the past medical history, past surgical history, social history and family history with the patient and they are unchanged from previous note.   PHYSICAL EXAMINATION:  Vitals:   02/06/19 0843 02/06/19 1154  BP: (!) 160/115   Pulse: 96 71  Resp:  20  Temp:    SpO2:  97%   Filed Weights   02/04/19 0412 02/05/19 0208 02/06/19 0659  Weight: 174 lb 2.6 oz (79 kg) 159 lb 2.8 oz (72.2 kg) 161 lb 9.6 oz (73.3 kg)    Intake/Output from previous day: 12/28 0701 - 12/29 0700 In: 948 [NG/GT:948] Out: 3225 [Urine:3225]  GENERAL: Chronically ill-appearing male, no distress SKIN: skin color, texture, turgor are normal, no rashes or significant lesions EYES: normal, Conjunctiva are pink and non-injected, sclera clear  NECK: Tracheostomy midline LYMPH:  no palpable lymphadenopathy in the cervical, axillary or inguinal LUNGS: Diminished breath sounds HEART: Irregular, no edema ABDOMEN:abdomen soft, non-tender and normal bowel sounds Musculoskeletal:no cyanosis of digits and no clubbing  NEURO: Opens eyes to voice, does not follow commands, he is moving his left arm and bilateral legs spontaneously  LABORATORY DATA:  I have reviewed the data as listed CMP Latest Ref Rng & Units 02/06/2019 02/05/2019 02/03/2019  Glucose 70 - 99 mg/dL 95 101(H) 92  BUN 8 - 23 mg/dL _0 Creatinine 0.61 - 1.24 mg/dL 0.82 0.77 0.61  Sodium 135 - 145 mmol/L 138 136 139  Potassium 3.5 - 5.1 mmol/L 4.2 3.8 3.5  Chloride 98 - 111 mmol/L 101 97(L) 104  CO2 22 - 32 mmol/L _1 Calcium 8.9 - 10.3 mg/dL 9.4 9.0  8.2(L)  Total Protein 6.5 - 8.1 g/dL - - -  Total Bilirubin 0.3 - 1.2 mg/dL - - -  Alkaline Phos 38 - 126 U/L - - -  AST 15 - 41 U/L - - -  ALT 0 - 44 U/L - - -    Lab Results  Component Value Date   WBC 12.6 (H) 02/06/2019   HGB 13.6 02/06/2019   HCT 40.9 02/06/2019   MCV 102.8 (H) 02/06/2019   PLT 925 (HH) 02/06/2019   NEUTROABS 6.8 01/18/2019    CT Code Stroke CTA Head W/WO contrast  Result Date: 01/18/2019 CLINICAL DATA:  Acute left MCA occlusion syndrome. EXAM: CT ANGIOGRAPHY HEAD AND NECK TECHNIQUE: Multidetector CT imaging of the head and neck was performed using the standard protocol during bolus administration of intravenous contrast. Multiplanar CT image reconstructions and MIPs were obtained to evaluate the vascular anatomy. Carotid stenosis measurements (when applicable) are obtained utilizing NASCET criteria, using the distal internal carotid diameter as the denominator. CONTRAST:  127m OMNIPAQUE IOHEXOL 350 MG/ML SOLN COMPARISON:  Head CT earlier same day FINDINGS: CTA NECK FINDINGS Aortic arch: Aortic atherosclerosis. No aneurysm or dissection. Branching pattern is normal without origin stenosis. Right carotid system: Common carotid artery is widely patent to the bifurcation region. Soft and calcified plaque at the carotid bifurcation and ICA bulb but no stenosis. Cervical ICA is patent to the skull base. Left carotid system: Common carotid artery  patent to the bifurcation. Calcified and soft plaque at the carotid bifurcation and ICA bulb. No stenosis. Cervical ICA widely patent. Vertebral arteries: Dominant right vertebral artery is widely patent at the origin and through the cervical region to the foramen magnum. Left vertebral artery is occluded at the origin and reconstitutes in the distal cervical region from collaterals. Skeleton: Mild midcervical spondylosis. Other neck: No mass or lymphadenopathy. Upper chest: Emphysema. No focal or active process otherwise. CTA HEAD  FINDINGS Anterior circulation: Both internal carotid arteries are patent through the siphon regions. No siphon stenosis. Anterior and middle cerebral vessels are patent without flow limiting proximal stenosis, aneurysm or vascular malformation. Both anterior cerebral arteries receive there supply from the left carotid circulation. I do not see any large or medium vessel occlusion, with particular attention to the left MCA branches. I think there may be a few M3 to M4 branches on the left that lose opacification distally and there could be a few small left MCA territory emboli. Posterior circulation: Both vertebral arteries show flow at the foramen magnum with the right being dominant. Bilateral PICA flow demonstrated. No basilar stenosis. Superior cerebellar and posterior cerebral arteries show flow. Distal PCA branches show atherosclerotic irregularity. Venous sinuses: Patent and normal. Anatomic variants: None significant otherwise. Review of the MIP images confirms the above findings IMPRESSION: Atherosclerotic disease at both carotid bifurcations but no stenosis. Prominent soft plaque at the left ICA bulb. 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 Dominant right vertebral artery widely patent. No intracranial large or medium vessel occlusion or correctable proximal stenosis, with specific attention to the left MCA branches. I do think there are a few distal MCA branches that lose opacification and there could be a few small emboli in the left MCA territory. Vessels marked with arrows. Distal vessel atherosclerotic irregularity, particularly evident in the PCA branches. Aortic Atherosclerosis (ICD10-I70.0) and Emphysema (ICD10-J43.9). Electronically Signed   By: Nelson Chimes M.D.   On: 01/18/2019 19:59   CT HEAD WO CONTRAST  Result Date: 01/24/2019 CLINICAL DATA:  Stroke follow-up EXAM: CT HEAD WITHOUT CONTRAST TECHNIQUE: Contiguous axial  images were obtained from the base of the skull through the vertex without intravenous contrast. COMPARISON:  CT head 01/19/2019, MRI 12 FINDINGS: Brain: Evolving area of infarction in the left temporal and parietal lobe. Small amount of hemorrhage is present in the infarct as noted on MRI prior CT, unchanged. Hypodensity left occipital lobe compatible with evolving recent infarct. This is difficult to see on the prior CT but is noted on the prior MRI. No associated hemorrhage. Chronic infarct right cerebellum is unchanged. Mild atrophy. Chronic microvascular ischemic changes in the white matter. Negative for hydrocephalus Vascular: Negative for hyperdense vessel Skull: Negative Sinuses/Orbits: Mucosal edema throughout the paranasal sinuses. Negative orbit. Other: None IMPRESSION: Involving acute/subacute infarcts in the left frontal parietal lobe and left occipital lobe. Small amount of hemorrhage in the left posterior frontal infarct unchanged from prior CT and MRI. Electronically Signed   By: Franchot Gallo M.D.   On: 01/24/2019 16:18   CT Head Wo Contrast  Result Date: 01/19/2019 CLINICAL DATA:  Stroke, follow-up. EXAM: CT HEAD WITHOUT CONTRAST TECHNIQUE: Contiguous axial images were obtained from the base of the skull through the vertex without intravenous contrast. COMPARISON:  CT angiogram head 01/18/2019, non-contrast CT head 01/18/2019, MRA head 11/25/2014, brain MRI 11/23/2014. FINDINGS: Brain: Loss of gray-white differentiation within portions of the lateral left frontal lobe (series  3, images 25 and 26) (series 6, images 50-54). Findings are consistent with acute infarct. No evidence of acute intracranial hemorrhage. No intracranial mass. No midline shift or extra-axial fluid collection. Chronic lacunar infarct within the right caudate nucleus. Additional chronic infarcts within the bilateral cerebellar hemispheres, right temporal lobe, bilateral occipital lobes and right parietal lobe. Several of  these remote infarcts were better appreciated on prior brain MRI 11/23/2014. Mild generalized parenchymal atrophy. Vascular: No hyperdense vessel.  Atherosclerotic calcifications. Skull: Normal. Negative for fracture or focal lesion. Sinuses/Orbits: Right frontal scalp/periorbital soft tissue hematoma. No significant paranasal sinus disease or mastoid effusion at the imaged levels. Partially visualized life-support tubes. IMPRESSION: Interval demarcation of an acute cortically based infarct within the lateral left frontal lobe as described. No acute intracranial hemorrhage or significant mass effect. Electronically Signed   By: Kellie Simmering DO   On: 01/19/2019 12:50   CT Code Stroke CTA Neck W/WO contrast  Result Date: 01/18/2019 CLINICAL DATA:  Acute left MCA occlusion syndrome. EXAM: CT ANGIOGRAPHY HEAD AND NECK TECHNIQUE: Multidetector CT imaging of the head and neck was performed using the standard protocol during bolus administration of intravenous contrast. Multiplanar CT image reconstructions and MIPs were obtained to evaluate the vascular anatomy. Carotid stenosis measurements (when applicable) are obtained utilizing NASCET criteria, using the distal internal carotid diameter as the denominator. CONTRAST:  133m OMNIPAQUE IOHEXOL 350 MG/ML SOLN COMPARISON:  Head CT earlier same day FINDINGS: CTA NECK FINDINGS Aortic arch: Aortic atherosclerosis. No aneurysm or dissection. Branching pattern is normal without origin stenosis. Right carotid system: Common carotid artery is widely patent to the bifurcation region. Soft and calcified plaque at the carotid bifurcation and ICA bulb but no stenosis. Cervical ICA is patent to the skull base. Left carotid system: Common carotid artery patent to the bifurcation. Calcified and soft plaque at the carotid bifurcation and ICA bulb. No stenosis. Cervical ICA widely patent. Vertebral arteries: Dominant right vertebral artery is widely patent at the origin and through  the cervical region to the foramen magnum. Left vertebral artery is occluded at the origin and reconstitutes in the distal cervical region from collaterals. Skeleton: Mild midcervical spondylosis. Other neck: No mass or lymphadenopathy. Upper chest: Emphysema. No focal or active process otherwise. CTA HEAD FINDINGS Anterior circulation: Both internal carotid arteries are patent through the siphon regions. No siphon stenosis. Anterior and middle cerebral vessels are patent without flow limiting proximal stenosis, aneurysm or vascular malformation. Both anterior cerebral arteries receive there supply from the left carotid circulation. I do not see any large or medium vessel occlusion, with particular attention to the left MCA branches. I think there may be a few M3 to M4 branches on the left that lose opacification distally and there could be a few small left MCA territory emboli. Posterior circulation: Both vertebral arteries show flow at the foramen magnum with the right being dominant. Bilateral PICA flow demonstrated. No basilar stenosis. Superior cerebellar and posterior cerebral arteries show flow. Distal PCA branches show atherosclerotic irregularity. Venous sinuses: Patent and normal. Anatomic variants: None significant otherwise. Review of the MIP images confirms the above findings IMPRESSION: Atherosclerotic disease at both carotid bifurcations but no stenosis. Prominent soft plaque at the left ICA bulb. 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 Dominant right vertebral artery widely patent. No intracranial large or medium vessel occlusion or correctable proximal stenosis, with specific attention to the left MCA branches. I do think there are a  few distal MCA branches that lose opacification and there could be a few small emboli in the left MCA territory. Vessels marked with arrows. Distal vessel atherosclerotic irregularity, particularly  evident in the PCA branches. Aortic Atherosclerosis (ICD10-I70.0) and Emphysema (ICD10-J43.9). Electronically Signed   By: Nelson Chimes M.D.   On: 01/18/2019 19:59   MR BRAIN WO CONTRAST  Result Date: 01/20/2019 CLINICAL DATA:  Stroke follow-up. Status post tPA administration. EXAM: MRI HEAD WITHOUT CONTRAST TECHNIQUE: Multiplanar, multiecho pulse sequences of the brain and surrounding structures were obtained without intravenous contrast. COMPARISON:  11/23/2014 FINDINGS: Brain: There is multifocal cortical ischemia within the left MCA territory. The largest area is in the left frontal lobe. There is ischemia throughout the left postcentral gyrus. There is no contralateral ischemia. Early confluent hyperintense T2-weighted signal of the periventricular and deep white matter, most commonly due to chronic ischemic microangiopathy. There are old right occipital and cerebellar infarcts. No hydrocephalus or age advanced volume loss. Blood-sensitive sequences show no chronic microhemorrhage or superficial siderosis. No intracranial hemorrhage. Vascular: Normal flow voids. Skull and upper cervical spine: Normal marrow signal. Sinuses/Orbits: Negative. Other: None. IMPRESSION: 1. Multifocal acute ischemia within the left MCA territory, including the left postcentral gyrus. No hemorrhage or mass effect. 2. 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 3. Chronic ischemic microangiopathy. Electronically Signed   By: Ulyses Jarred M.D.   On: 01/20/2019 02:22   DG CHEST PORT 1 VIEW  Result Date: 02/04/2019 CLINICAL DATA:  68 year old male with chronic lung disease, leukocytosis. EXAM: PORTABLE CHEST 1 VIEW COMPARISON:  Portable chest 01/29/2019 and earlier. FINDINGS: Portable AP semi upright view at 0526 hours. Stable tracheostomy. Feeding tube has been removed. Stable lung volumes and ventilation with veiling bibasilar and dense retrocardiac opacity. No superimposed pneumothorax and pulmonary vascularity in the upper  lobes seems to remain normal. IMPRESSION: 1. Feeding tube removed.  Stable tracheostomy. 2. Stable ventilation since 01/29/2019 with suspected bilateral pleural effusions and lower lobe collapse. Electronically Signed   By: Genevie Ann M.D.   On: 02/04/2019 07:23   DG CHEST PORT 1 VIEW  Result Date: 01/29/2019 CLINICAL DATA:  Tracheostomy in place EXAM: PORTABLE CHEST 1 VIEW COMPARISON:  01/24/2019 FINDINGS: Interval extubation and placement of tracheostomy device. Enteric tube passes into the stomach with tip out of field of view. Central line has been removed. No pneumothorax. Similar right pleural effusion and adjacent atelectasis. Increased retrocardiac density, probably atelectasis. Stable cardiomediastinal contours. IMPRESSION: Lines and tubes as above. Similar right pleural effusion and adjacent atelectasis. Increased left basilar density, probably atelectasis. Electronically Signed   By: Macy Mis M.D.   On: 01/29/2019 14:11   DG CHEST PORT 1 VIEW  Result Date: 01/24/2019 CLINICAL DATA:  Aspiration. EXAM: PORTABLE CHEST 1 VIEW COMPARISON:  January 21, 2019. FINDINGS: Stable cardiomediastinal silhouette. Endotracheal and nasogastric tubes are in good position. Left internal jugular catheter is unchanged. No pneumothorax is noted. Increased right pleural effusion is noted with associated right basilar atelectasis or infiltrate. Stable left basilar atelectasis is noted. Bony thorax is unremarkable. IMPRESSION: Stable support apparatus. Increased right pleural effusion is noted with associated right basilar atelectasis or infiltrate. Stable left basilar atelectasis is noted. No pneumothorax is noted. Stable left internal jugular catheter. Electronically Signed   By: Marijo Conception M.D.   On: 01/24/2019 09:19   Portable Chest x-ray  Result Date: 01/21/2019 CLINICAL DATA:  68 year old male with a history of endotracheal tube placement EXAM: PORTABLE CHEST 1 VIEW COMPARISON:  01/20/2019 FINDINGS:  Cardiomediastinal silhouette unchanged. Fullness in the central vasculature. Mild interlobular septal thickening. Endotracheal tube terminates 4.9 cm above the carina. Gastric tube terminates with the side port above the GE junction. Unchanged left IJ central venous catheter with the tip appearing to terminate superior vena cava. No pneumothorax. Hazy opacities bilateral lower lungs, with blunting of the right costophrenic angle. IMPRESSION: Endotracheal tube terminates 4.9 cm over the carina, suitably position. Gastric tube terminates with the side port above the GE junction. Unchanged left IJ central venous catheter. Increased opacities at the lung bases with evidence of mild edema, atelectasis and/or consolidation with small right pleural effusion. Electronically Signed   By: Corrie Mckusick D.O.   On: 01/21/2019 10:19   DG Chest Port 1 View  Result Date: 01/20/2019 CLINICAL DATA:  68 year old male intubated. EXAM: PORTABLE CHEST 1 VIEW COMPARISON:  Portable chest radiographs yesterday and earlier. FINDINGS: Portable AP semi upright view at 0448 hours. Endotracheal tube tip at the level the clavicles. Stable left IJ central line. Enteric tube courses to the abdomen with side hole at the level of the proximal stomach. Increasing retrocardiac opacity on the left since yesterday morning. Stable ventilation elsewhere with crowding of what are probably chronic increased interstitial markings. No pneumothorax. No definite effusion. Stable cardiac size and mediastinal contours. IMPRESSION: 1. Stable lines and tubes. 2. Increasing left lower lobe collapse or consolidation since yesterday. 3. Stable ventilation elsewhere. Electronically Signed   By: Genevie Ann M.D.   On: 01/20/2019 08:05   DG CHEST PORT 1 VIEW  Result Date: 01/19/2019 CLINICAL DATA:  Central line placement. EXAM: PORTABLE CHEST 1 VIEW COMPARISON:  01/19/2019 at 11:07 a.m. FINDINGS: Left internal jugular central venous line has its tip in the lower  superior vena cava. No pneumothorax.  Lungs remain clear. Endotracheal and oral/nasogastric tubes are stable and well positioned. IMPRESSION: New left internal jugular central venous line tip projects in the lower superior vena cava. No pneumothorax. Electronically Signed   By: Lajean Manes M.D.   On: 01/19/2019 14:11   Portable Chest x-ray  Result Date: 01/19/2019 CLINICAL DATA:  68 year old male intubated, OG tube placement. EXAM: PORTABLE CHEST 1 VIEW COMPARISON:  Portable chest yesterday and earlier. FINDINGS: Portable AP semi upright view at 1111 hours. Endotracheal tube tip at the level the clavicles. Enteric tube courses to the abdomen, tip not included. Lower lung volumes. Stable cardiac size and mediastinal contours. Mild chronic pulmonary interstitial markings appear stable from a 2018 portable chest. No superimposed pneumothorax, pleural effusion or consolidation. No acute osseous abnormality identified. Calcified aortic atherosclerosis. IMPRESSION: 1. ETT tip in good position at the level the clavicles. Enteric tube courses to the abdomen, tip not included. 2. Lower lung volumes but otherwise no acute cardiopulmonary abnormality. Electronically Signed   By: Genevie Ann M.D.   On: 01/19/2019 11:28   DG Chest Portable 1 View  Result Date: 01/18/2019 CLINICAL DATA:  Encephalopathy EXAM: PORTABLE CHEST 1 VIEW COMPARISON:  Chest radiograph 07/17/2016 FINDINGS: There is moderate cardiomegaly. There are faint airspace opacities in the right lung base. No pleural effusion or pneumothorax. IMPRESSION: Right basilar opacities may indicate aspiration or pneumonia. Electronically Signed   By: Ulyses Jarred M.D.   On: 01/18/2019 21:36   DG Abd Portable 1V  Result Date: 01/29/2019 CLINICAL DATA:  Feeding tube placement EXAM: PORTABLE ABDOMEN - 1 VIEW COMPARISON:  None. FINDINGS: New enteric tube is within the stomach. Visualized bowel gas pattern is unremarkable. IMPRESSION: Enteric tube within the  stomach. Electronically  Signed   By: Macy Mis M.D.   On: 01/29/2019 14:21   DG Abd Portable 1V  Result Date: 01/21/2019 CLINICAL DATA:  Reason for exam: New OG tube placement EXAM: PORTABLE ABDOMEN - 1 VIEW COMPARISON:  Radiograph 01/21/2012 at 939 hours FINDINGS: Advancement of NG tube such that the side port is and now 6 cm below the GE junction. Tip in the gastric body. IMPRESSION: Advancement of NG tube as above. Electronically Signed   By: Suzy Bouchard M.D.   On: 01/21/2019 11:24   DG Abd Portable 1V  Result Date: 01/21/2019 CLINICAL DATA:  68 year old male with a history of gastric tube placement EXAM: PORTABLE ABDOMEN - 1 VIEW COMPARISON:  01/20/2019, 01/21/2011 FINDINGS: Limited upper abdominal plain film demonstrates gastric tube with the side-port terminating above the GE junction. Tip of the gastric tube terminates at the epigastric region. Gas in stomach and colon. IMPRESSION: Gastric tube terminates with the side port above the GE junction, with the tip in the epigastric region. Electronically Signed   By: Corrie Mckusick D.O.   On: 01/21/2019 10:26   VAS [place] UPPER EXTREMITY ARTERIAL DUPLEX  Result Date: 01/29/2019 UPPER EXTREMITY DUPLEX STUDY Indications: Patient complains of cold hand.  Other Factors: CVA, subtherapeutic INR, Atrial fibrillation. Limitations: Patient ventilation, arm positioning, and constant arm movement. Comparison Study: No prior study on file for comparison Performing Technologist: Sharion Dove RVS  Examination Guidelines: A complete evaluation includes B-mode imaging, spectral Doppler, color Doppler, and power Doppler as needed of all accessible portions of each vessel. Bilateral testing is considered an integral part of a complete examination. Limited examinations for reoccurring indications may be performed as noted.  Left Doppler Findings: +-----------+----------+-------------------+------+-------------------+ Site       PSV (cm/s)Waveform            PlaqueComments            +-----------+----------+-------------------+------+-------------------+ Subclavian           triphasic                patent              +-----------+----------+-------------------+------+-------------------+ Axillary                                      thrombus            +-----------+----------+-------------------+------+-------------------+ Brachial                                      thrombus            +-----------+----------+-------------------+------+-------------------+ Radial     22        dampened monophasic                          +-----------+----------+-------------------+------+-------------------+ Ulnar                                         could not visualize +-----------+----------+-------------------+------+-------------------+ Palmar Arch                                   could not visualize +-----------+----------+-------------------+------+-------------------+  Summary:  Left: There  appears to be arterial thrombus noted in the left       axillary and brachial arteries. There are dampened waveforms       noted in the proximal radial artery. Unable to visualize ulnar       artery, mid to distal radial artery, or palmar arch secondary       to patient's positioning and constant movement of arm.       Incidentally, there is DVT noted in the left brachial and       axillary veins and acute SVT noted in the left cephalic and       basilic veins. *See table(s) above for measurements and observations. Electronically signed by Harold Barban MD on 01/29/2019 at 8:23:59 AM.    Final    ECHOCARDIOGRAM COMPLETE  Result Date: 01/19/2019   ECHOCARDIOGRAM REPORT   Patient Name:   MANJOT HINKS Date of Exam: 01/19/2019 Medical Rec #:  [id number]         Height:       68.0 in Accession #:    [id number]        Weight:       176.4 lb Date of Birth:  [birth year]         BSA:          1.94 m Patient Age:    68 years          BP:            72/65 mmHg Patient Gender: M                 HR:           72 bpm. Exam Location:  Inpatient Procedure: 2D Echo Indications:    Stroke 434.91/I163.9  History:        Patient has prior history of Echocardiogram examinations, most                 recent 11/25/2014. COPD, Arrythmias:Atrial Fibrillation; Risk                 Factors:Hypertension.  Sonographer:    Clayton Lefort RDCS (AE) Referring Phys: [phone] [facility] P [facility]  Sonographer Comments: Technically challenging study due to limited acoustic windows, Technically difficult study due to poor echo windows, suboptimal parasternal window, suboptimal apical window, suboptimal subcostal window and echo performed with patient supine and on artificial respirator. Image acquisition challenging due to chest wall deformity. All images taken subcostally. No measurements made in 4-2-3 chambers views due to images being significantly off-axis. IMPRESSIONS  1. Left ventricular ejection fraction, by visual estimation, is 55 to 60%. The left ventricle has normal function. There is moderately increased left ventricular hypertrophy.  2. The left ventricle demonstrates regional wall motion abnormalities.  3. Global right ventricle has normal systolic function.The right ventricular size is normal. No increase in right ventricular wall thickness.  4. Left atrial size was normal.  5. Right atrial size was mildly dilated.  6. The mitral valve is normal in structure. Mild to moderate mitral valve regurgitation.  7. The tricuspid valve is normal in structure. Tricuspid valve regurgitation is trivial.  8. The aortic valve is normal in structure. Aortic valve regurgitation is not visualized.  9. The pulmonic valve was normal in structure. Pulmonic valve regurgitation is not visualized. 10. The atrial septum is grossly normal. FINDINGS  Left Ventricle: Left ventricular ejection fraction, by visual estimation, is 55 to 60%. The left ventricle has normal function. The left ventricle  demonstrates regional wall motion abnormalities. There is moderately increased left ventricular hypertrophy. Right Ventricle: The right ventricular size is normal. No increase in right ventricular wall thickness. Global RV systolic function is has normal systolic function. Left Atrium: Left atrial size was normal in size. Right Atrium: Right atrial size was mildly dilated Pericardium: There is no evidence of pericardial effusion. Mitral Valve: The mitral valve is normal in structure. Mild to moderate mitral valve regurgitation. Tricuspid Valve: The tricuspid valve is normal in structure. Tricuspid valve regurgitation is trivial. Aortic Valve: The aortic valve is normal in structure. Aortic valve regurgitation is not visualized. Pulmonic Valve: The pulmonic valve was normal in structure. Pulmonic valve regurgitation is not visualized. Pulmonic regurgitation is not visualized. Aorta: The aortic root and ascending aorta are structurally normal, with no evidence of dilitation. IAS/Shunts: The atrial septum is grossly normal.  LEFT VENTRICLE PLAX 2D LVIDd:         4.10 cm LVIDs:         3.40 cm LV PW:         1.50 cm LV IVS:        1.60 cm LVOT diam:     2.00 cm LV SV:         27 ml LV SV Index:   13.57 LVOT Area:     3.14 cm  IVC IVC diam: 2.70 cm LEFT ATRIUM         Index LA diam:    3.50 cm 1.81 cm/m   AORTA Ao Root diam: 3.60 cm TRICUSPID VALVE TR Peak grad:   11.0 mmHg TR Vmax:        166.00 cm/s  SHUNTS Systemic Diam: 2.00 cm  Mertie Moores MD Electronically signed by Mertie Moores MD Signature Date/Time: 01/19/2019/4:52:07 PM    Final    CT HEAD CODE STROKE WO CONTRAST  Result Date: 01/18/2019 CLINICAL DATA:  Code stroke.  Right facial droop EXAM: CT HEAD WITHOUT CONTRAST TECHNIQUE: Contiguous axial images were obtained from the base of the skull through the vertex without intravenous contrast. COMPARISON:  MRI 11/23/2014 FINDINGS: Brain: Old right lateral cerebellar infarction. Old right occipital  infarction. Chronic small-vessel ischemic changes of the cerebral hemispheric white matter. No sign of acute infarction, mass lesion, hemorrhage, hydrocephalus or extra-axial collection. Vascular: There is atherosclerotic calcification of the major vessels at the base of the brain. Skull: Negative Sinuses/Orbits: Clear Other: Right frontal scalp hematoma. ASPECTS [facility] Stroke Program Early CT Score) - Ganglionic level infarction (caudate, lentiform nuclei, internal capsule, insula, M1-M3 cortex): 7 - Supraganglionic infarction (M4-M6 cortex): 3 Total score (0-10 with 10 being normal): 10 IMPRESSION: 1. No acute finding by CT. Chronic small-vessel ischemic changes of the white matter. Old right cerebellar and right occipital infarctions. 2. ASPECTS is 10 3. These results were communicated to Dr. Leonel Ramsay at 7:33 pmon 12/10/2020by text page via the [facility] messaging system. Electronically Signed   By: Nelson Chimes M.D.   On: 01/18/2019 19:34    ASSESSMENT AND PLAN: 1. Thrombocytosis 2. Iron deficiency anemia 3. Acute stroke with distal branch occlusions of the left MCA 4. Acute respiratory failure secondary to CVA, status post tracheostomy 5. Atrial fibrillation with RVR  -Platelet count slightly again today to 925,000. He is status post IV iron and has been started on hydroxyurea earlier today. I think it is really too early to see a response. The patient may have developed a myeloproliferative disorder. Recommend continued monitoring. JAK2 and Calreticulin are pending. Ideally, we would consider  a bone marrow biopsy but given his deconditioned state, we will not put him through an invasive procedure at this point. -He remains on anticoagulation with Eliquis.    LOS: 19 days   Mikey Bussing, DNP, AGPCNP-BC, AOCNP 02/06/19

## 2019-02-06 NOTE — Progress Notes (Signed)
   NAME:  [name], MRN:  [id number], DOB:  Jul 07, 1950, LOS: 53 ADMISSION DATE:  01/18/2019, CONSULTATION DATE:  01/18/19 REFERRING MD:  Leonel Ramsay, CHIEF COMPLAINT:  Unable to elicit   Brief History   10yM with hypertensive emergency in setting of acute L MCA stroke s/p tPA, AF/RVR.  Has been noncompliant with his warfarin for A. Fib. Code stroke activated, TPA administered. Intubated for respiratory failure. PCCM consulted for vent management  Significant Hospital Events   12/10 tPA administered at 1946 12/11 intubated 12/21 Tracheostomy 12/22 PEG placed  Significant Diagnostic Tests:  CTA h/n 12/10 > distal MCA branches that lose opacification and there could be a few small emboli in the left MCA territory.  CT head 12/11 > Interval demarcation of an acute cortically based infarct within the lateral left frontal lobe as described. No acute intracranial hemorrhage or significant mass effect.  MRI brain 12/12 >  1. Multifocal acute ischemia within the left MCA territory, including the left postcentral gyrus. No hemorrhage or mass effect. 2. 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 3. Chronic ischemic microangiopathy.  Micro Data:  Blood 12/10 > NGTD Urine 12/10 > insignificant growth  MRSA surveillance 12/11 > negative  SARS Coronavirus 2 12/11 > negative  Antimicrobials:  Cefepime 12/18>25 (7 day course)   Interim history/subjective:  Tolerated trach collar since 12/27. Awake and tracking this morning.  Objective   Blood pressure (!) 160/115, pulse 96, temperature 97.9 F (36.6 C), resp. rate 20, height 5' 8.5" (1.74 m), weight 73.3 kg, SpO2 100 %.    FiO2 (%):  [28 %] 28 %   Intake/Output Summary (Last 24 hours) at 02/06/2019 1121 Last data filed at 02/06/2019 0700 Gross per 24 hour  Intake 711 ml  Output 3025 ml  Net -2314 ml   Filed Weights   02/04/19 0412 02/05/19 0208 02/06/19 0659  Weight: 79 kg 72.2 kg 73.3 kg   Physical Exam: General:  Chronically ill-appearing, no acute distress, on trach collar HENT: [place], AT, OP clear, MMM Neck: Trach in place, c/d/i Eyes: EOMI, no scleral icterus Respiratory: Diminished breath sounds bilaterally. No crackles, wheezing or rales Cardiovascular: Irregularly irregular rate and rhythm, -M/R/G, no JVD GI: BS+, soft, nontender Extremities:-Edema,-tenderness Neuro: Opens eyes to voice, tracking, bilateral lower extremity spontaneously moving, not following commands  Resolved Issues  Streptoccoccus pneumoniae and Enterobacter cloacae pneumonia s/p Cefepime x 7 days  Assessment & Plan:  68 year old male with atrial fibrillation on anticoagulation who presented for right-sided weakness and admitted to Neurology for left MCA stroke s/p tPA. He remained vent-dependent in setting of stroke. Developed VAP + Streptoccoccus pneumoniae and Enterobacter cloacae which was treated with IV antibiotics. For his prolonged need for mechanical ventilation and difficulty weaning off the vent, he had tracheostomy on 12/21. Since 12/27, he has tolerated trach collar trials. More awake and alert today. Weak cough. Secretions present.  Chronic respiratory failure due to L MCA CVA S/p tracheostomy 12/21 by PCCM  Plan: Tolerating trach collar Continue pulm hygiene: PRN Duonebs Titrate O2 for sat of 88-92% Resume home lasix Speech consulted Continue aspiration precautions Will continue to follow to determine candidacy to downsize trach to size 4 cuffless  Code Status: Full code Family Communication: Per primary team. I updated patient's son at bedside on 12/29 regarding above plan Disposition: PCU  Care Time: 37 min  Pulmonary will follow 2-3 times per week  Rodman Pickle, M.D. [facility] Pulmonary/Critical Care Medicine 02/06/2019 11:21 AM

## 2019-02-06 NOTE — Evaluation (Addendum)
[facility] Speaking Valve - Evaluation Patient Details  Name: [name] MRN: [id number] Date of Birth: 10-01-1950  Today's Date: 02/06/2019 Time: 1503-1512 SLP Time Calculation (min) (ACUTE ONLY): 9 min  Past Medical History:  Past Medical History:  Diagnosis Date  . AAA (abdominal aortic aneurysm) ([place])    per last CT 03-26-2016---  3.9cm  . Anticoagulated on Coumadin   . Arthritis of spine   . Benign localized prostatic hyperplasia with lower urinary tract symptoms (LUTS)   . Chronic atrial fibrillation ([place]) cardiolgoist -- dr Stanford Breed   dx 2009  . Complication of anesthesia    per pt today (07-08-2016) had problem after having anesthesia 12/ 2016 surgery at [facility] "not good" went to ED ( in epic ED visit 01/ 2017 dx severe episode of major depression disorder and admitted to [facility]  . COPD with emphysema ([place])   . Dyspnea on exertion   . Full dentures   . GERD (gastroesophageal reflux disease)   . Hematuria   . History of amaurosis fugax    right eye 02-08-2015 -- resolved /  documented in epic possible left eye amaurosis fugax 11/ 2008 (pt denies) / per last MRI show previous bilateral occipital lobe infarcts  . History of concussion    as child  . History of CVA (cerebrovascular accident) 10/ 2016  and per pt Dec 2017 in [place]   neurologist-  dr Leonie Man-- per note silent infarcts on MRI-- multiple acute infarcts bilateral cerebullm, right temporal lobe, and bilateral occipital lobes due to embolic phenomena (atrial fib.)  . History of seizures as a child   . Hyperlipidemia   . Hypertension   . Left arm numbness    occasional numbess post residual axilla arterial occlusion and surgery  . Major depressive disorder    hx severe episode without psychosis 02/2015 w/ homicidal ideation  . Narcotic dependence ([place])   . PAD (peripheral artery disease) ([place])    followed by dr fields-- s/p  left axilla to branchial and left axilla to ulnar bypass graft due to ischemic hand   . Pulmonary nodule    per CT 03-26-2016  right lower lobe  . PVD (peripheral vascular disease) ([place])   . Right ureteral stone   . Senile purpura ([place])   . Weakness of left arm    residual from axilla arterial occlusion and post surgery  . Wears glasses    Past Surgical History:  Past Surgical History:  Procedure Laterality Date  . ARCH AORTOGRAM N/A 04/05/2014   Procedure: ARCH AORTOGRAM, FIRST ORDER CATHETERIZATION LEFT SUBCLAVIAN ARTERY;  Surgeon: Elam Dutch, MD;  Location: [facility] OR;  Service: Vascular;  Laterality: N/A;  . AXILLARY-FEMORAL BYPASS GRAFT Left 04/08/2014   Procedure: LEFT AXILLARY ARTERY TO BRACHIAL ARTERY BYPASS USING NON REVERSE LEFT GREATER SAPHENOUS VEIN ,LIGATION OF LEFT AXILLARY ARTERY ANEURYSM;  Surgeon: Elam Dutch, MD;  Location: [place];  Service: Vascular;  Laterality: Left;  . BYPASS AXILLA/BRACHIAL ARTERY Left 01/27/2015   Procedure: LEFT BRACHIAL-ULAR ARTERY BYPASS USING GREATER SAPHENOUS VEIN;  Surgeon: Elam Dutch, MD;  Location: [place];  Service: Vascular;  Laterality: Left;  . CYSTOSCOPY WITH RETROGRADE PYELOGRAM, URETEROSCOPY AND STENT PLACEMENT Right 07/17/2016   Procedure: CYSTOSCOPY WITH RETROGRADE PYELOGRAM, URETEROSCOPY AND STENT PLACEMENT,LASER;  Surgeon: Festus Aloe, MD;  Location: [facility];  Service: Urology;  Laterality: Right;  . CYSTOSCOPY/URETEROSCOPY/HOLMIUM LASER/STENT PLACEMENT Right 07/13/2016   Procedure: CYSTOSCOPY, RIGHT URETEROSCOPY RETROGRADE PYELOGRAM;  Surgeon: Kathie Rhodes, MD;  Location: [facility];  Service: Urology;  Laterality: Right;  . EMBOLECTOMY Left 04/05/2014   Procedure: THROMBO ENDARTERECTOMY OF LEFT BRACHIAL, RADIAL AND ULNAR ARTERY,  Left Radial artery cut down and radial artery thrombectomy.;  Surgeon: Elam Dutch, MD;  Location: [facility] OR;  Service: Vascular;  Laterality: Left;  . ESOPHAGOGASTRODUODENOSCOPY N/A 01/30/2019   Procedure: ESOPHAGOGASTRODUODENOSCOPY (EGD);  Surgeon: Jesusita Oka, MD;  Location[facility] ENDOSCOPY;  Service: General;  Laterality: N/A;  . facial cyst Right    cyst removal.   . PATCH ANGIOPLASTY Left 04/05/2014   Procedure: LEFT ARM VEIN PATCH ANGIOPLASTY;  Surgeon: Elam Dutch, MD;  Location: [place];  Service: Vascular;  Laterality: Left;  . PEG PLACEMENT N/A 01/30/2019   Procedure: PERCUTANEOUS ENDOSCOPIC GASTROSTOMY (PEG) PLACEMENT;  Surgeon: Jesusita Oka, MD;  Location: [place];  Service: General;  Laterality: N/A;  . PERIPHERAL VASCULAR CATHETERIZATION N/A 01/22/2015   Procedure: Aortic Arch Angiography;  Surgeon: Elam Dutch, MD;  Location: [place] [facility];  Service: Cardiovascular;  Laterality: N/A;  . THROMBECTOMY BRACHIAL ARTERY Left 11/20/2014   Procedure: 1.  Thrombectomy Left Axilo-Brachial Bypass  2.  Thromboendarterecotmy of Left Brachial Artery with Fogarty Thrombectomy of Radial and Ulnar Arteries with Dacron patch angioplasty Left Brachial Artery. 3. Intraoperative  Arteriogram times four.;  Surgeon: Mal Misty, MD;  Location: [place];  Service: Vascular;  Laterality: Left;  . TRANSTHORACIC ECHOCARDIOGRAM  11/25/2014   moderate concentric LVH, ef 60-65%, unable to evaluation LVDF due to atrial fib/  mild MR/  severe LAE  . VEIN HARVEST Left 04/08/2014   Procedure: LEFT GREATER SAPHENOUS VEIN HARVEST;  Surgeon: Elam Dutch, MD;  Location: [place];  Service: Vascular;  Laterality: Left;  Marland Kitchen VEIN HARVEST Right 01/27/2015   Procedure: RIGHT GREATER SAPHENOUS VEIN HARVEST;  Surgeon: Elam Dutch, MD;  Location: [place];  Service: Vascular;  Laterality: Right;   HPI: Pt is a 68 y/o M admitted with L MCA CVA s/p TPA. He was intubated 12/11-12/13, reintubated 12/13; trach 12/21. PMH: tobacco use, previous strokes, GERD, COPD, AAA, PVD, HTN, HLD, pulmonary nodule, depression, afib (recently missed doses of coumadin)     Assessment / Plan / Recommendation Clinical Impression  Pt wore his PMV for almost 20 minutes under  direct SLP supervision with no evidence of back pressure and occasional phonation elicited, suggestive of adequate upper airway patency. He has difficulty using his PMV for functional communication (see speech/language evaluation for further details). He does not cough to command but will cough spontaneously on audible secretions and is capable of bringing them up into his mouth. I suctioned moderate amounts out of his mouth, but he is resistent to letting me use the yankauer, so there was more that I could not get out. Would wear PMV intermittently when full staff supervision can be provided in light of cognitive-linguistic status and level of secretions. PMV was left in place with PT to supervise.    SLP Visit Diagnosis: Aphonia (R49.1)    SLP Assessment  Patient needs continued Speech Lanaguage Pathology Services    Follow Up Recommendations  (tba)    Frequency and Duration min 2x/week  2 weeks    PMSV Trial PMSV was placed for: 17 min Able to redirect subglottic air through upper airway: Yes Able to Attain Phonation: Yes Voice Quality: Wet Able to Expectorate Secretions: Yes Level of Secretion Expectoration with PMSV: Oral Breath Support for Phonation: Mildly decreased Intelligibility: (limited verbal output for assessment)  Respirations During Trial: 25 SpO2 During Trial: 97 % Pulse During Trial: 78 Behavior: Alert   Tracheostomy Tube       Vent Dependency  FiO2 (%): 28 %    Cuff Deflation Trial  GO Tolerated Cuff Deflation: Yes Length of Time for Cuff Deflation Trial: almost 20 min Behavior: Alert        02/06/2019, 4:45 PM  Osie Bond., M.A. [place] Acute Environmental education officer [phone] Office [phone]

## 2019-02-06 NOTE — Progress Notes (Signed)
STROKE TEAM PROGRESS NOTE   INTERVAL HISTORY Patient roommate at bedside.  Patient more awake and interactive, eyes open, however, still has left gaze preference and not following commands.  Tolerating trach collar well.  PT/OT/speech on board.  OBJECTIVE Vitals:   02/06/19 0659 02/06/19 0822 02/06/19 0842 02/06/19 0843  BP:   (!) 169/111 (!) 160/115  Pulse:  91 76 96  Resp:  20 20   Temp:   97.9 F (36.6 C)   TempSrc:      SpO2:   100%   Weight: 73.3 kg     Height:        CBC:  Recent Labs  Lab 02/05/19 0303 02/06/19 0450  WBC 12.6* 12.6*  HGB 13.3 13.6  HCT 38.0* 40.9  MCV 100.5* 102.8*  PLT 914* 925*    Basic Metabolic Panel:  Recent Labs  Lab 02/02/19 0317 02/05/19 0303 02/06/19 0450  NA 138 136 138  K 3.7 3.8 4.2  CL 103 97* 101  CO2 25 28 26   GLUCOSE 95 101* 95  BUN 19 19 22   CREATININE 0.62 0.77 0.82  CALCIUM 8.4* 9.0 9.4  MG 2.1 1.9  --   PHOS 2.8 3.8  --     Lipid Panel:     Component Value Date/Time   CHOL 238 (H) 01/19/2019 0437   TRIG 64 01/19/2019 0437   HDL 43 01/19/2019 0437   CHOLHDL 5.5 01/19/2019 0437   VLDL 13 01/19/2019 0437   LDLCALC 182 (H) 01/19/2019 0437   HgbA1c:  Lab Results  Component Value Date   HGBA1C 5.1 01/19/2019   Urine Drug Screen:     Component Value Date/Time   LABOPIA NONE DETECTED 07/13/2016 1428   COCAINSCRNUR NONE DETECTED 07/13/2016 1428   LABBENZ POSITIVE (A) 07/13/2016 1428   AMPHETMU NONE DETECTED 07/13/2016 1428   THCU POSITIVE (A) 07/13/2016 1428   LABBARB NONE DETECTED 07/13/2016 1428    Alcohol Level     Component Value Date/Time   ETH <5 02/09/2015 0010    IMAGING  CT HEAD WO CONTRAST 01/24/2019 IMPRESSION: Involving acute/subacute infarcts in the left frontal parietal lobe and left occipital lobe. Small amount of hemorrhage in the left posterior frontal infarct unchanged from prior CT and MRI. Electronically Signed   By: Franchot Gallo M.D.   On: 01/24/2019 16:18   MRI Wo Contrast   01/20/2019 IMPRESSION: 1. Multifocal acute ischemia within the left MCA territory, including the left postcentral gyrus. No hemorrhage or mass effect. 2. 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 3. Chronic ischemic microangiopathy.  CT Head Wo Contrast  01/19/2019 IMPRESSION: Interval demarcation of an acute cortically based infarct within the lateral left frontal lobe as described. No acute intracranial hemorrhage or significant mass effect.  CT Code Stroke CTA Head W/WO contrast CT Code Stroke CTA Neck W/WO contrast 01/18/2019 IMPRESSION:  Atherosclerotic disease at both carotid bifurcations but no stenosis.  Prominent soft plaque at the left ICA bulb.  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  Dominant right vertebral artery widely patent.  No intracranial large or medium vessel occlusion or correctable proximal stenosis, with specific attention to the left MCA branches.  I do think there are a few distal MCA branches that lose opacification and there could be a few small emboli in the left MCA territory. Vessels marked with arrows.  Distal vessel atherosclerotic irregularity, particularly evident in the PCA branches.  Aortic Atherosclerosis (ICD10-I70.0)  Emphysema (  ICD10-J43.9).   CT HEAD CODE STROKE WO CONTRAST 01/18/2019  IMPRESSION: 1. No acute finding by CT. Chronic small-vessel ischemic changes of the white matter. Old right cerebellar and right occipital infarctions.  2. ASPECTS is 10   Portable Chest 1 View 02/04/19 Stable lung volumes and ventilation with veiling bibasilar and dense retrocardiac opacity. No superimposed pneumothorax and pulmonary vascularity in the upper lobes seems to remain normal. IMPRESSION: 1. Feeding tube removed.  Stable tracheostomy. 2. Stable ventilation since 01/29/2019 with suspected bilateral pleural effusions and lower lobe collapse.  Transthoracic Echocardiogram   01/19/2019 IMPRESSIONS  1. Left ventricular ejection fraction, by visual estimation, is 55 to 60%. The left ventricle has normal function. There is moderately increased left ventricular hypertrophy.  2. The left ventricle demonstrates regional wall motion abnormalities.  3. Global right ventricle has normal systolic function.The right ventricular size is normal. No increase in right ventricular wall thickness.  4. Left atrial size was normal.  5. Right atrial size was mildly dilated.  6. The mitral valve is normal in structure. Mild to moderate mitral valve regurgitation.  7. The tricuspid valve is normal in structure. Tricuspid valve regurgitation is trivial.  8. The aortic valve is normal in structure. Aortic valve regurgitation is not visualized.  9. The pulmonic valve was normal in structure. Pulmonic valve regurgitation is not visualized. 10. The atrial septum is grossly normal.  ECG atrial fibrillation - ventricular response 133 BPM (See cardiology reading for complete details)  PHYSICAL EXAM  Blood pressure (!) 160/115, pulse 96, temperature 97.9 F (36.6 C), resp. rate 20, height 5' 8.5" (1.74 m), weight 73.3 kg, SpO2 100 %.  General - frail elderly Caucasian male, on trach collar, more awake and interactive  Ophthalmologic - fundi not visualized due to noncooperation.  Cardiovascular - irregularly irregular heart rate and rhythm.  Neuro - pt lying in bed, on trach collar, tolerating well. He is awake and more interactive than yesterday but still not following commands. Eyes left gaze preference, barely cross midline. Inconsistently blinking to visual threat bilaterally. Mouthing words but intelligible. PERRL. Right facial droop. Tongue protrusion not cooperative. Left UE at least 4/5, purposeful movement against gravity. RUE flaccid. BLEs withdraw to pain, 3/5 on the left and 3-/5 on the right. DTR 1+ and bilateral babinski. Sensation, coordination and gait not  tested.   ASSESSMENT/PLAN [name] is a 68 y.o. male with history of tobacco use, previous strokes, COPD, AAA, ASPVD, htn, hld, pulmonary nodule, depression, atrial fibrillation on coumadin but recently missed doses presenting with transient aphasia and leaning to the right. The patient received IV t-PA Thursday 01/18/19 @ 2000.  Stroke - left MCA infarct s/p t-PA - embolic pattern, likely due to atrial fibrillation with subtherapeutic INR  Resultant global aphasia, left gaze, right hemiparesis, right neglect and agitation  Code Stroke CT Head - No acute finding by CT. Chronic small-vessel ischemic changes of the white matter. Old right cerebellar and right occipital infarctions. ASPECTS is 10   CT head - Interval demarcation of an acute cortically based infarct within the lateral left frontal lobe as described. No acute intracranial hemorrhage or significant mass effect.  MRI head -  Multifocal acute ischemia within the left MCA territory, including the left postcentral gyrus. No hemorrhage or mass effect. 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 Chronic ischemic microangiopathy.  CTA H&N - Prominent soft plaque at the left ICA bulb. 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   CT 12/16 stable, continued evolution of left MCA infarcts  2D Echo - EF 55 to 60%. No cardiac source of emboli identified.   [employer] Virus 2 - negative  LDL - 182  HgbA1c - 5.1  VTE prophylaxis -Heparin subq  warfarin daily prior to admission, now on Eliquis  Therapy recommendations: pending    Disposition:  Pending  Afib with RVR  On amiodarone and Cardizem po  On metoprolol 100 bid   Not compliant with warfarin PTA, INR 1.1 on admission INR 1.2 on 02/02/2019  On heparin IV and coumadin  Switched to Eliquis 12/26  Respiratory failure with hypoxia  Intubated -> s/p trach 12/21 -> trach collar 12/27  Off  vent 12/27  CCM will follow 2-3 times per week  On step down  B12 deficiency  Vitamin B12 = 161  B12 supplement  History of stroke  11/2014, bilateral blurry vision after left upper extremity bypass surgery.  Found to have left central vision loss.  MRI showed bilateral embolic infarcts.  INR 1.45.  Carotid Doppler negative.  TTE unremarkable.  LDL 82, A1c 5.4.  Continued on discharge with Coumadin and Lipitor 40  Hypotension, resolved Hx of hypertension  Home BP meds: Diltiazem, Lasix and metoprolol  Current BP meds clonidine 0.3 q 6h , metoprolol 25 bid  BP stable  Off neo  On Lasix 20 daily and metoprolol 100 twice daily now  Hyperlipidemia  Home Lipid lowering medication: Lipitor 40 mg daily  LDL 182, goal < 70  Current lipid lowering medication: Lipitor 80 mg daily   Continue statin at discharge  Dysphagia  N.p.o.  On bolus TF now  S/p PEG 12/22  Speech on board  VAP Fever and leukocytosis  T-max 101.7->100.2->100.4->101.5->99.6->99.3->97.7->97.5->97.9    Leucocytosis - 7.8->9.0->15.6->16.9->15.6->13.2->12.6->12.6    CXR 02/04/19 - Stable ventilation since 01/29/2019 with suspected bilateral pleural effusions and lower lobe collapse.  UA neg 01/18/19  Was on cefepime (started 01/26/19 x 7 days) for S.pneumoniae and E.cloacae in sputum ->now off all antibiotics  CCM on board  Tobacco abuse  Current smoker  Smoking cessation counseling will be provided  Pt is willing to quit  Thrombocytosis  Platelet 750->828->843->914-925     Hematology consulted  has large and giant PLTS on smear  Possibly reactive, doubt myeloproliferative d/c as normal PTA  Not a candidate for bone marrow bx  Placed on empiric hydroxyurea  Labs:  calreticulin pending, Jak2 pending   Fe deficiency  Fe 41, Fol 4.7, Sat ratios low  On feraheme  Other Stroke Risk Factors  Advanced age  ETOH use, advised to drink no more than 1 alcoholic beverage per  day.  Hx narcotic dependence  AAA  Other Active Problems  Hypokalemia 3.5->3.8->4.2     Oral thrush on nystatin   Palliative Care consult 12/25 -> brother wants aggressive care -> (Dr Leonie Man contacted [facility] per family request; however,  [facility] would not agree to accept transfer)  Hospital day # 67  Rosalin Hawking, MD PhD Stroke Neurology 02/06/2019 11:18 AM

## 2019-02-06 NOTE — Discharge Instructions (Signed)

## 2019-02-06 NOTE — Evaluation (Signed)
Speech Language Pathology Evaluation Patient Details Name: [name] MRN: [id number] DOB: Jun 28, 1950 Today's Date: 02/06/2019 Time: [id number] SLP Time Calculation (min) (ACUTE ONLY): 9 min  Problem List:  Patient Active Problem List   Diagnosis Date Noted  . Pressure injury of skin 02/06/2019  . Protein-calorie malnutrition, severe 01/30/2019  . Acute on chronic respiratory failure ([place]) 01/28/2019  . Aspiration into airway   . Endotracheal tube present   . Hypertensive emergency   . Stroke (cerebrum) ([place]) 01/18/2019  . Hydronephrosis 07/17/2016  . Kidney stone 07/17/2016  . Acute kidney injury ([place]) 07/17/2016  . Elevated troponin 07/17/2016  . Adjustment disorder with disturbance of emotion 07/14/2016  . Hyperlipemia 07/07/2016  . Arterial occlusion due to stenosis ([place]) 08/21/2015  . Aftercare following surgery of the circulatory system 08/21/2015  . Current smoker 08/21/2015  . Abdominal aortic aneurysm (AAA) ([place]) 05/02/2015  . MDD (major depressive disorder), single episode, severe , no psychosis ([place]) 02/10/2015  . Major depressive disorder, recurrent, severe without psychotic features ([place]) 02/09/2015  . Limb ischemia 01/23/2015  . Numbness 01/20/2015  . PAD (peripheral artery disease) ([place]) 12/10/2014  . Embolic stroke ([place]) [id number]  . Ischemia of extremity 11/20/2014  . Ischemia of hand 04/05/2014  . Special screening for malignant neoplasms, colon 03/20/2014  . Warfarin-induced coagulopathy ([place]) 03/20/2014  . Encounter for therapeutic drug monitoring 03/08/2013  . Headache 01/29/2013  . Prostate nodule with urinary obstruction 12/13/2012  . Routine general medical examination at a health care facility 12/13/2012  . Depression with anxiety 12/07/2012  . Long term (current) use of anticoagulants 04/30/2010  . HYPERCHOLESTEROLEMIA-PURE 04/22/2008  . TOBACCO ABUSE 04/22/2008  . HYPERTENSION, BENIGN ESSENTIAL 04/22/2008  . Atrial fibrillation with RVR  ([place]) 04/22/2008  . COPD (chronic obstructive pulmonary disease) ([place]) 04/22/2008   Past Medical History:  Past Medical History:  Diagnosis Date  . AAA (abdominal aortic aneurysm) ([place])    per last CT 03-26-2016---  3.9cm  . Anticoagulated on Coumadin   . Arthritis of spine   . Benign localized prostatic hyperplasia with lower urinary tract symptoms (LUTS)   . Chronic atrial fibrillation ([place]) cardiolgoist -- dr Stanford Breed   dx 2009  . Complication of anesthesia    per pt today (07-08-2016) had problem after having anesthesia 12/ 2016 surgery at [facility] "not good" went to ED ( in epic ED visit 01/ 2017 dx severe episode of major depression disorder and admitted to [facility]  . COPD with emphysema ([place])   . Dyspnea on exertion   . Full dentures   . GERD (gastroesophageal reflux disease)   . Hematuria   . History of amaurosis fugax    right eye 02-08-2015 -- resolved /  documented in epic possible left eye amaurosis fugax 11/ 2008 (pt denies) / per last MRI show previous bilateral occipital lobe infarcts  . History of concussion    as child  . History of CVA (cerebrovascular accident) 10/ 2016  and per pt Dec 2017 in [place]   neurologist-  dr Leonie Man-- per note silent infarcts on MRI-- multiple acute infarcts bilateral cerebullm, right temporal lobe, and bilateral occipital lobes due to embolic phenomena (atrial fib.)  . History of seizures as a child   . Hyperlipidemia   . Hypertension   . Left arm numbness    occasional numbess post residual axilla arterial occlusion and surgery  . Major depressive disorder    hx severe episode without psychosis 02/2015 w/ homicidal ideation  . Narcotic dependence ([place])   .  PAD (peripheral artery disease) ([place])    followed by dr fields-- s/p  left axilla to branchial and left axilla to ulnar bypass graft due to ischemic hand  . Pulmonary nodule    per CT 03-26-2016  right lower lobe  . PVD (peripheral vascular disease) ([place])   . Right ureteral stone    . Senile purpura ([place])   . Weakness of left arm    residual from axilla arterial occlusion and post surgery  . Wears glasses    Past Surgical History:  Past Surgical History:  Procedure Laterality Date  . ARCH AORTOGRAM N/A 04/05/2014   Procedure: ARCH AORTOGRAM, FIRST ORDER CATHETERIZATION LEFT SUBCLAVIAN ARTERY;  Surgeon: Elam Dutch, MD;  [facility];  Service: Vascular;  Laterality: N/A;  . AXILLARY-FEMORAL BYPASS GRAFT Left 04/08/2014   Procedure: LEFT AXILLARY ARTERY TO BRACHIAL ARTERY BYPASS USING NON REVERSE LEFT GREATER SAPHENOUS VEIN ,LIGATION OF LEFT AXILLARY ARTERY ANEURYSM;  Surgeon: Elam Dutch, MD;  Location: [facility];  Service: Vascular;  Laterality: Left;  . BYPASS AXILLA/BRACHIAL ARTERY Left 01/27/2015   Procedure: LEFT BRACHIAL-ULAR ARTERY BYPASS USING GREATER SAPHENOUS VEIN;  Surgeon: Elam Dutch, MD;  Location: [facility];  Service: Vascular;  Laterality: Left;  . CYSTOSCOPY WITH RETROGRADE PYELOGRAM, URETEROSCOPY AND STENT PLACEMENT Right 07/17/2016   Procedure: CYSTOSCOPY WITH RETROGRADE PYELOGRAM, URETEROSCOPY AND STENT PLACEMENT,LASER;  Surgeon: Festus Aloe, MD;  Location: [facility] ORS;  Service: Urology;  Laterality: Right;  . CYSTOSCOPY/URETEROSCOPY/HOLMIUM LASER/STENT PLACEMENT Right 07/13/2016   Procedure: CYSTOSCOPY, RIGHT URETEROSCOPY RETROGRADE PYELOGRAM;  Surgeon: Kathie Rhodes, MD;  Location: [facility];  Service: Urology;  Laterality: Right;  . EMBOLECTOMY Left 04/05/2014   Procedure: THROMBO ENDARTERECTOMY OF LEFT BRACHIAL, RADIAL AND ULNAR ARTERY,  Left Radial artery cut down and radial artery thrombectomy.;  Surgeon: Elam Dutch, MD;  Location: [facility] OR;  Service: Vascular;  Laterality: Left;  . ESOPHAGOGASTRODUODENOSCOPY N/A 01/30/2019   Procedure: ESOPHAGOGASTRODUODENOSCOPY (EGD);  Surgeon: Jesusita Oka, MD;  Location[facility] ENDOSCOPY;  Service: General;  Laterality: N/A;  . facial cyst Right    cyst removal.   . PATCH  ANGIOPLASTY Left 04/05/2014   Procedure: LEFT ARM VEIN PATCH ANGIOPLASTY;  Surgeon: Elam Dutch, MD;  Location: [facility];  Service: Vascular;  Laterality: Left;  . PEG PLACEMENT N/A 01/30/2019   Procedure: PERCUTANEOUS ENDOSCOPIC GASTROSTOMY (PEG) PLACEMENT;  Surgeon: Jesusita Oka, MD;  Location: [facility];  Service: General;  Laterality: N/A;  . PERIPHERAL VASCULAR CATHETERIZATION N/A 01/22/2015   Procedure: Aortic Arch Angiography;  Surgeon: Elam Dutch, MD;  Location: [facility] CV [facility];  Service: Cardiovascular;  Laterality: N/A;  . THROMBECTOMY BRACHIAL ARTERY Left 11/20/2014   Procedure: 1.  Thrombectomy Left Axilo-Brachial Bypass  2.  Thromboendarterecotmy of Left Brachial Artery with Fogarty Thrombectomy of Radial and Ulnar Arteries with Dacron patch angioplasty Left Brachial Artery. 3. Intraoperative  Arteriogram times four.;  Surgeon: Mal Misty, MD;  Location: [facility];  Service: Vascular;  Laterality: Left;  . TRANSTHORACIC ECHOCARDIOGRAM  11/25/2014   moderate concentric LVH, ef 60-65%, unable to evaluation LVDF due to atrial fib/  mild MR/  severe LAE  . VEIN HARVEST Left 04/08/2014   Procedure: LEFT GREATER SAPHENOUS VEIN HARVEST;  Surgeon: Elam Dutch, MD;  Location: [facility];  Service: Vascular;  Laterality: Left;  Marland Kitchen VEIN HARVEST Right 01/27/2015   Procedure: RIGHT GREATER SAPHENOUS VEIN HARVEST;  Surgeon: Elam Dutch, MD;  Location: [facility];  Service: Vascular;  Laterality: Right;   HPI:  Pt is a 68 y/o M admitted with L MCA CVA s/p TPA. He was intubated 12/11-12/13, reintubated 12/13; trach 12/21. PMH: tobacco use, previous strokes, GERD, COPD, AAA, PVD, HTN, HLD, pulmonary nodule, depression, afib (recently missed doses of coumadin)   Assessment / Plan / Recommendation Clinical Impression  Pt has a significant epxressive and receptive aphasia with suspected cognitive deficits that will need further differential diagnosis given severity of aphasia. Pt has a  left-sided gaze preference and does not respond to one-step commands and yes/no questions provided by SLP, although RN notes occasional completion of one-step commands earlier in the day. With PMV in place pt perseverated on the same phrase ("g-damn") but did not produce any other clear verbalizations. At times he appeared to say something else, but it was not intelligible (or perhaps was neologistic error). Pt will need SLP f/u to try to maximize functional communication.     SLP Assessment  SLP Recommendation/Assessment: Patient needs continued Speech Lanaguage Pathology Services SLP Visit Diagnosis: Aphasia (R47.01)    Follow Up Recommendations  (tba)    Frequency and Duration min 2x/week  2 weeks      SLP Evaluation Cognition  Overall Cognitive Status: Impaired/Different from baseline Arousal/Alertness: Awake/alert Orientation Level: Intubated/Tracheostomy - Unable to assess Problem Solving: Impaired Problem Solving Impairment: Functional basic Behaviors: Restless Comments: difficult to formally assess given level of aphasia       Comprehension  Auditory Comprehension Overall Auditory Comprehension: Impaired Yes/No Questions: Impaired Basic Biographical Questions: 0-25% accurate Commands: Impaired One Step Basic Commands: 0-24% accurate    Expression Expression Primary Mode of Expression: Verbal Verbal Expression Overall Verbal Expression: Impaired Initiation: Impaired Automatic Speech: (none) Level of Generative/Spontaneous Verbalization: (perseverated on  the same phrase) Repetition: Impaired Level of Impairment: Word level Naming: Impairment Verbal Errors: Perseveration Pragmatics: Impairment Impairments: Eye contact;Other (comment)(left gaze preference)   Oral / Motor  Motor Speech Overall Motor Speech: (difficult to assess given minimal verbal output but dysarthr) Intelligibility: (limited verbal output for assessment)   GO                    Osie Bond.,  M.A. [facility] Pager [phone] Office [phone]  02/06/2019, 5:03 PM

## 2019-02-07 DIAGNOSIS — Z4659 Encounter for fitting and adjustment of other gastrointestinal appliance and device: Secondary | ICD-10-CM

## 2019-02-07 DIAGNOSIS — R4701 Aphasia: Secondary | ICD-10-CM

## 2019-02-07 DIAGNOSIS — Z931 Gastrostomy status: Secondary | ICD-10-CM

## 2019-02-07 LAB — CBC
HCT: 42.3 % (ref 39.0–52.0)
Hemoglobin: 14 g/dL (ref 13.0–17.0)
MCH: 34.8 pg — ABNORMAL HIGH (ref 26.0–34.0)
MCHC: 33.1 g/dL (ref 30.0–36.0)
MCV: 105.2 fL — ABNORMAL HIGH (ref 80.0–100.0)
Platelets: 823 10*3/uL — ABNORMAL HIGH (ref 150–400)
RBC: 4.02 MIL/uL — ABNORMAL LOW (ref 4.22–5.81)
RDW: 14.6 % (ref 11.5–15.5)
WBC: 12.3 10*3/uL — ABNORMAL HIGH (ref 4.0–10.5)
nRBC: 0 % (ref 0.0–0.2)

## 2019-02-07 LAB — GLUCOSE, CAPILLARY
Glucose-Capillary: 112 mg/dL — ABNORMAL HIGH (ref 70–99)
Glucose-Capillary: 117 mg/dL — ABNORMAL HIGH (ref 70–99)
Glucose-Capillary: 72 mg/dL (ref 70–99)
Glucose-Capillary: 79 mg/dL (ref 70–99)
Glucose-Capillary: 82 mg/dL (ref 70–99)
Glucose-Capillary: 87 mg/dL (ref 70–99)
Glucose-Capillary: 89 mg/dL (ref 70–99)

## 2019-02-07 LAB — BASIC METABOLIC PANEL
Anion gap: 11 (ref 5–15)
BUN: 20 mg/dL (ref 8–23)
CO2: 25 mmol/L (ref 22–32)
Calcium: 9.4 mg/dL (ref 8.9–10.3)
Chloride: 99 mmol/L (ref 98–111)
Creatinine, Ser: 0.78 mg/dL (ref 0.61–1.24)
GFR calc Af Amer: >60 mL/min (ref >60)
GFR calc non Af Amer: >60 mL/min (ref >60)
Glucose, Bld: 92 mg/dL (ref 70–99)
Potassium: 4 mmol/L (ref 3.5–5.1)
Sodium: 135 mmol/L (ref 135–145)

## 2019-02-07 LAB — ANTITHROMBIN III: AntiThromb III Func: 106 % (ref 75–120)

## 2019-02-07 NOTE — Evaluation (Signed)
Occupational Therapy Evaluation Patient Details Name: [name] MRN: [id number] DOB: 1950/11/21 Today's Date: 02/07/2019    History of Present Illness 54yM admitted 01/20/19 with hypertensive emergency in setting of acute L MCA stroke s/p tPA, AF/RVR.  Has been noncompliant with his warfarin for A. Fib. Intubated for respiratory failure. Trach 01/29/19 with PEG planned for 01/30/19. No sedation at time of PT eval.    Clinical Impression   Pt currently very limited by CVA with R sided weakness, near flaccidity and inability to follow commands. Pt with trach and difficult to communicate. Pt unable to fully focus on OTR for accurate vision screen. Pt totalA for ADL at this time. O2 5L, 28% trach collar >98% O2. Pt finding therapist in his visual field, but followed only ~10% of commands throughout session. Pt requires +2 for bed mobility and any transfer. Pt would greatly benefit from continued OT skilled services for ADL, mobility and R side weakness. OT following acutely.    Follow Up Recommendations  SNF;Supervision/Assistance - 24 hour    Equipment Recommendations  Other (comment)(next facility to determine)    Recommendations for Other Services       Precautions / Restrictions Precautions Precautions: Fall;Other (comment) Precaution Comments: multiple lines, on TC with PMV for short periods Restrictions Weight Bearing Restrictions: No      Mobility Bed Mobility Overal bed mobility: Needs Assistance             General bed mobility comments: deferred. Pt not following commands well today and only +1 assist available  Transfers                 General transfer comment: to be determined    Balance                                           ADL either performed or assessed with clinical judgement   ADL Overall ADL's : Needs assistance/impaired Eating/Feeding: NPO   Grooming: Total assistance;Bed level   Upper Body Bathing: Total  assistance;Bed level   Lower Body Bathing: Total assistance;Bed level   Upper Body Dressing : Total assistance;Bed level   Lower Body Dressing: Total assistance;Bed level   Toilet Transfer: Total assistance;+2 for physical assistance;+2 for safety/equipment   Toileting- Clothing Manipulation and Hygiene: Total assistance;+2 for physical assistance;+2 for safety/equipment;Bed level       Functional mobility during ADLs: Total assistance;+2 for physical assistance;+2 for safety/equipment General ADL Comments: Pt totalA at this time.     Vision Baseline Vision/History: No visual deficits Vision Assessment?: Vision impaired- to be further tested in functional context Additional Comments: Pt unable to fully focus on OTR for accurate vision screen     Perception     Praxis      Pertinent Vitals/Pain Pain Assessment: Faces Faces Pain Scale: No hurt Pain Intervention(s): Monitored during session     Hand Dominance Right   Extremity/Trunk Assessment Upper Extremity Assessment Upper Extremity Assessment: Generalized weakness;RUE deficits/detail RUE Deficits / Details: noxious stimulus withdrawal (cold washrag) RUE Coordination: decreased fine motor;decreased gross motor LUE Deficits / Details: moves spontaneously and inconsistently to command.  Sometimes purposeful LUE Coordination: decreased fine motor   Lower Extremity Assessment Lower Extremity Assessment: RLE deficits/detail RLE Deficits / Details: Pt moves spontaneously   Cervical / Trunk Assessment Cervical / Trunk Exceptions: head sidebent to his right.  Stiff with cervical extension or  lying back on the pillow.   Communication Communication Communication: Tracheostomy;Other (comment)(cogntive deficits)   Cognition Arousal/Alertness: Lethargic;Awake/alert Behavior During Therapy: Flat affect Overall Cognitive Status: Impaired/Different from baseline Area of Impairment: Orientation;Attention;Memory;Following  commands;Safety/judgement;Awareness;Problem solving                 Orientation Level: Place;Time;Situation Current Attention Level: Focused   Following Commands: Follows one step commands inconsistently Safety/Judgement: Decreased awareness of safety;Decreased awareness of deficits Awareness: Intellectual Problem Solving: Slow processing;Decreased initiation;Difficulty sequencing;Requires verbal cues;Requires tactile cues General Comments: Pt not stating any words today.   General Comments  O2 5L, 28% trach collar. Pt finding therapist in his visual field, but followed ~10% of commands.    Exercises Exercises: Other exercises Other Exercises Other Exercises: PROM to RUE shoulder through digitsl supported in bed   Shoulder Instructions      Home Living Family/patient expects to be discharged to:: Unsure                                 Additional Comments: per chart, brother wants aggressive care      Prior Functioning/Environment          Comments: unknown at time         OT Problem List: Decreased strength;Decreased activity tolerance;Impaired balance (sitting and/or standing);Decreased safety awareness;Pain      OT Treatment/Interventions: Self-care/ADL training;Therapeutic exercise;Neuromuscular education;Energy conservation;Therapeutic activities;Cognitive remediation/compensation;Visual/perceptual remediation/compensation;Patient/family education;Balance training    OT Goals(Current goals can be found in the care plan section) Acute Rehab OT Goals Patient Stated Goal: none stated OT Goal Formulation: Patient unable to participate in goal setting Time For Goal Achievement: 02/21/19 Potential to Achieve Goals: Fair ADL Goals Pt Will Perform Eating: with set-up;with adaptive utensils;sitting Pt Will Perform Grooming: with min assist;sitting Pt Will Perform Upper Body Dressing: with min assist;sitting Pt Will Transfer to Toilet: with mod  assist;stand pivot transfer;bedside commode Pt/caregiver will Perform Home Exercise Program: Right Upper extremity;Increased strength;Increased ROM;With minimal assist Additional ADL Goal #1: pt will follow 75% of commands in 5/5 trials  OT Frequency: Min 2X/week   Barriers to D/C: Decreased caregiver support          Co-evaluation              AM-PAC OT "6 Clicks" Daily Activity     Outcome Measure Help from another person eating meals?: Total Help from another person taking care of personal grooming?: Total Help from another person toileting, which includes using toliet, bedpan, or urinal?: Total Help from another person bathing (including washing, rinsing, drying)?: Total Help from another person to put on and taking off regular upper body clothing?: Total Help from another person to put on and taking off regular lower body clothing?: Total 6 Click Score: 6   End of Session Equipment Utilized During Treatment: Oxygen Nurse Communication: Mobility status  Activity Tolerance: Treatment limited secondary to medical complications (Comment) Patient left: in bed;with call bell/phone within reach;with bed alarm set  OT Visit Diagnosis: Unsteadiness on feet (R26.81);Muscle weakness (generalized) (M62.81);Cognitive communication deficit (R41.841) Symptoms and signs involving cognitive functions: Cerebral infarction                Time: 1524-1600 OT Time Calculation (min): 36 min Charges:  OT General Charges $OT Visit: 1 Visit OT Evaluation $OT Eval Moderate Complexity: 1 Mod OT Treatments $Neuromuscular Re-education: 8-22 mins  Jefferey Pica OTR/L Acute Rehabilitation Services Pager: [phone] Office: [phone]   Ebony Hail  C 02/07/2019, 5:19 PM

## 2019-02-07 NOTE — Progress Notes (Signed)
Mr. Wilinski has been moved out of the ICU.  He is now in a stepdown unit on 4 NP.  He really does not have much in way of purposeful movements.  He raises his left arm.  There is really not much in the way of right-sided movement.  He gets fed through the feeding tube.  His platelet count is coming down.  His platelet count today is 823,000.  He has gotten IV iron.  He is on the Hydrea at 500 mg a day.  His white cell count is 12.3.  Hemoglobin 14.  Platelet count is 823,000.  MCV is 105.  I am still awaiting the JAK2 and Calreticulin assays.  Lattie Haw, MD  [facility] 7:7-8

## 2019-02-07 NOTE — Progress Notes (Signed)
STROKE TEAM PROGRESS NOTE   INTERVAL HISTORY No family or RN at bedside. Pt more awake alert, looking around when people in the room. Mild agitation with voice or pain stimulation, not following commands, but cursed with "god dame it". Left gaze preference but able to attending to the right.  OBJECTIVE Vitals:   02/07/19 0750 02/07/19 0823 02/07/19 1120 02/07/19 1130  BP: (!) 138/117 (!) 136/100 (!) 143/98 109/89  Pulse: 89 93 73 67  Resp: 14 17 20  (!) 26  Temp: 98.2 F (36.8 C)   98.7 F (37.1 C)  TempSrc: Oral   Oral  SpO2: 98% 97% 98% 99%  Weight:      Height:        CBC:  Recent Labs  Lab 02/06/19 0450 02/07/19 0539  WBC 12.6* 12.3*  HGB 13.6 14.0  HCT 40.9 42.3  MCV 102.8* 105.2*  PLT 925* 823*    Basic Metabolic Panel:  Recent Labs  Lab 02/02/19 0317 02/05/19 0303 02/06/19 0450 02/07/19 0539  NA 138 136 138 135  K 3.7 3.8 4.2 4.0  CL 103 97* 101 99  CO2 25 28 26 25   GLUCOSE 95 101* 95 92  BUN 19 19 22 20   CREATININE 0.62 0.77 0.82 0.78  CALCIUM 8.4* 9.0 9.4 9.4  MG 2.1 1.9  --   --   PHOS 2.8 3.8  --   --     Lipid Panel:     Component Value Date/Time   CHOL 238 (H) 01/19/2019 0437   TRIG 64 01/19/2019 0437   HDL 43 01/19/2019 0437   CHOLHDL 5.5 01/19/2019 0437   VLDL 13 01/19/2019 0437   LDLCALC 182 (H) 01/19/2019 0437   HgbA1c:  Lab Results  Component Value Date   HGBA1C 5.1 01/19/2019   Urine Drug Screen:     Component Value Date/Time   LABOPIA NONE DETECTED 07/13/2016 1428   COCAINSCRNUR NONE DETECTED 07/13/2016 1428   LABBENZ POSITIVE (A) 07/13/2016 1428   AMPHETMU NONE DETECTED 07/13/2016 1428   THCU POSITIVE (A) 07/13/2016 1428   LABBARB NONE DETECTED 07/13/2016 1428    Alcohol Level     Component Value Date/Time   ETH <5 02/09/2015 0010    IMAGING  CT HEAD WO CONTRAST 01/24/2019 IMPRESSION: Involving acute/subacute infarcts in the left frontal parietal lobe and left occipital lobe. Small amount of hemorrhage in the  left posterior frontal infarct unchanged from prior CT and MRI. Electronically Signed   By: Franchot Gallo M.D.   On: 01/24/2019 16:18   MRI Wo Contrast  01/20/2019 IMPRESSION: 1. Multifocal acute ischemia within the left MCA territory, including the left postcentral gyrus. No hemorrhage or mass effect. 2. 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 3. Chronic ischemic microangiopathy.  CT Head Wo Contrast  01/19/2019 IMPRESSION: Interval demarcation of an acute cortically based infarct within the lateral left frontal lobe as described. No acute intracranial hemorrhage or significant mass effect.  CT Code Stroke CTA Head W/WO contrast CT Code Stroke CTA Neck W/WO contrast 01/18/2019 IMPRESSION:  Atherosclerotic disease at both carotid bifurcations but no stenosis.  Prominent soft plaque at the left ICA bulb.  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  Dominant right vertebral artery widely patent.  No intracranial large or medium vessel occlusion or correctable proximal stenosis, with specific attention to the left MCA branches.  I do think there are a few distal MCA branches that lose opacification and there  could be a few small emboli in the left MCA territory. Vessels marked with arrows.  Distal vessel atherosclerotic irregularity, particularly evident in the PCA branches.  Aortic Atherosclerosis (ICD10-I70.0)  Emphysema (ICD10-J43.9).   CT HEAD CODE STROKE WO CONTRAST 01/18/2019  IMPRESSION: 1. No acute finding by CT. Chronic small-vessel ischemic changes of the white matter. Old right cerebellar and right occipital infarctions.  2. ASPECTS is 10   Portable Chest 1 View 02/04/19 Stable lung volumes and ventilation with veiling bibasilar and dense retrocardiac opacity. No superimposed pneumothorax and pulmonary vascularity in the upper lobes seems to remain normal. IMPRESSION: 1. Feeding tube removed.  Stable  tracheostomy. 2. Stable ventilation since 01/29/2019 with suspected bilateral pleural effusions and lower lobe collapse.  Transthoracic Echocardiogram  01/19/2019 IMPRESSIONS  1. Left ventricular ejection fraction, by visual estimation, is 55 to 60%. The left ventricle has normal function. There is moderately increased left ventricular hypertrophy.  2. The left ventricle demonstrates regional wall motion abnormalities.  3. Global right ventricle has normal systolic function.The right ventricular size is normal. No increase in right ventricular wall thickness.  4. Left atrial size was normal.  5. Right atrial size was mildly dilated.  6. The mitral valve is normal in structure. Mild to moderate mitral valve regurgitation.  7. The tricuspid valve is normal in structure. Tricuspid valve regurgitation is trivial.  8. The aortic valve is normal in structure. Aortic valve regurgitation is not visualized.  9. The pulmonic valve was normal in structure. Pulmonic valve regurgitation is not visualized. 10. The atrial septum is grossly normal.  ECG atrial fibrillation - ventricular response 133 BPM (See cardiology reading for complete details)  PHYSICAL EXAM    Blood pressure 109/89, pulse 67, temperature 98.7 F (37.1 C), temperature source Oral, resp. rate (!) 26, height 5' 8.5" (1.74 m), weight 71.5 kg, SpO2 99 %.  General - frail elderly Caucasian male, on trach collar, more awake and interactive  Ophthalmologic - fundi not visualized due to noncooperation.  Cardiovascular - irregularly irregular heart rate and rhythm.  Neuro - pt lying in bed, on trach collar, tolerating well. He is awake and more interactive but still not following commands. Eyes left gaze preference, able to cross midline, right gaze incomplete. Able to curse with "god damn it". Not name or repeat. Inconsistently blinking to visual threat bilaterally. PERRL. Right facial droop. Tongue protrusion not cooperative. Left UE  at least 4/5, purposeful movement against gravity. RUE 2/5. BLEs withdraw to pain, 4/5 on the left and 3/5 on the right. DTR 1+ and bilateral babinski. Sensation, coordination and gait not tested.   ASSESSMENT/PLAN [name] is a 68 y.o. male with history of tobacco use, previous strokes, COPD, AAA, ASPVD, HTN, HLD, pulmonary nodule, depression, atrial fibrillation on coumadin but recently missed doses presenting with transient aphasia and leaning to the right. The patient received IV t-PA Thursday 01/18/19 @ 2000.  Stroke - left MCA infarct s/p t-PA - embolic pattern, likely due to atrial fibrillation with subtherapeutic INR  Resultant global aphasia, left gaze, right hemiparesis, right neglect and agitation  Code Stroke CT Head - No acute finding by CT. Chronic small-vessel ischemic changes of the white matter. Old right cerebellar and right occipital infarctions. ASPECTS is 10   CT head - Interval demarcation of an acute cortically based infarct within the lateral left frontal lobe as described. No acute intracranial hemorrhage or significant mass effect.  MRI head -  Multifocal acute ischemia within the left MCA  territory, including the left postcentral gyrus. No hemorrhage or mass effect. 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 Chronic ischemic microangiopathy.  CTA H&N - Prominent soft plaque at the left ICA bulb. 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   CT 12/16 stable, continued evolution of left MCA infarcts  2D Echo - EF 55 to 60%. No cardiac source of emboli identified.   [employer] Virus 2 - negative  LDL - 182  HgbA1c - 5.1  Hypercoagulable work up pending  VTE prophylaxis -Heparin subq  warfarin daily prior to admission, now on Eliquis  Therapy recommendations: SNF  Disposition:  Pending  Afib with RVR  On amiodarone and Cardizem po  On metoprolol 100 bid   Not compliant  with warfarin PTA, INR 1.1 on admission INR 1.2 on 02/02/2019  On heparin IV and coumadin  Switched to Eliquis 12/26  Respiratory failure with hypoxia  Intubated -> s/p trach 12/21 -> trach collar 12/27  Off vent 12/27  CCM will follow 2-3 times per week  Follow to determine candidacy to downsize trach size 4 cuffless  On step down  B12 deficiency  Vitamin B12 = 161  B12 supplement  History of stroke  11/2014, bilateral blurry vision after left upper extremity bypass surgery.  Found to have left central vision loss.  MRI showed bilateral embolic infarcts.  INR 1.45.  Carotid Doppler negative.  TTE unremarkable.  LDL 82, A1c 5.4.  Continued on discharge with Coumadin and Lipitor 40  Hypotension, resolved Hx of hypertension  Home BP meds: Diltiazem, Lasix and metoprolol  Current BP meds clonidine 0.3 q 6h , metoprolol 25 bid  BP stable  Off neo  On Lasix 20 daily and metoprolol 100 twice daily now  Hyperlipidemia  Home Lipid lowering medication: Lipitor 40 mg daily  LDL 182, goal < 70  Current lipid lowering medication: Lipitor 80 mg daily   Continue statin at discharge  Dysphagia  NPO  On bolus TF now  S/p PEG 12/22  Speech on board  Continue with alternative feeds  VAP Fever and leukocytosis  T-max 101.7->...afebrile    Leucocytosis - 7.8->9.0->15.6->16.9->15.6->13.2->12.6-12.6>12.3    CXR 02/04/19 - Stable ventilation since 01/29/2019 with suspected bilateral pleural effusions and lower lobe collapse.  UA neg 01/18/19  Was on cefepime (started 01/26/19 x 7 days) for S.pneumoniae and E.cloacae in sputum ->now off all antibiotics  CCM on board  Tobacco abuse  Current smoker  Smoking cessation counseling will be provided  Pt is willing to quit  Thrombocytosis  Platelet 750->828->843->914-925->823     Hematology consulted  has large and giant PLTS on smear  Possibly reactive, doubt myeloproliferative d/c as normal PTA  Not a  candidate for bone marrow bx  Hypercoagulable work up pending  Placed on empiric hydroxyurea  Labs:  calreticulin pending, Jak2 pending   Fe deficiency  Fe 41, Fol 4.7, Sat ratios low  On feraheme  Other Stroke Risk Factors  Advanced age  ETOH use, advised to drink no more than 1 alcoholic beverage per day.  Hx narcotic dependence  AAA  PAD s/p L axilla to brachial and L axilla to ulnar BPG  Other Active Problems  Hypokalemia 3.5->3.8->4.2->4.0   Oral thrush on nystatin   Palliative Care consult 12/25 -> brother wants aggressive care -> (Dr Leonie Man contacted [facility] per family request; however,  [facility] would not agree to accept transfer)  Hospital day # 20  Rosalin Hawking, MD  PhD Stroke Neurology 02/07/2019 12:51 PM

## 2019-02-07 NOTE — Progress Notes (Signed)
NAME:  [name], MRN:  [id number], DOB:  11-20-1950, LOS: 80 ADMISSION DATE:  01/18/2019, CONSULTATION DATE:  01/18/19 REFERRING MD:  Leonel Ramsay, CHIEF COMPLAINT:  Unable to elicit   Brief History   18yM with hypertensive emergency in setting of acute L MCA stroke s/p tPA, AF/RVR.  Has been noncompliant with his warfarin for A. Fib. Code stroke activated, TPA administered. Intubated for respiratory failure. PCCM consulted for vent management  Significant Hospital Events   12/10 tPA administered at 1946 12/11 intubated 12/21 Tracheostomy 12/22 PEG placed  Significant Diagnostic Tests:  CTA h/n 12/10 > distal MCA branches that lose opacification and there could be a few small emboli in the left MCA territory.  CT head 12/11 > Interval demarcation of an acute cortically based infarct within the lateral left frontal lobe as described. No acute intracranial hemorrhage or significant mass effect.  MRI brain 12/12 >  1. Multifocal acute ischemia within the left MCA territory, including the left postcentral gyrus. No hemorrhage or mass effect. 2. 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 3. Chronic ischemic microangiopathy.  Micro Data:  Blood 12/10 > NGTD Urine 12/10 > insignificant growth  MRSA surveillance 12/11 > negative  SARS Coronavirus 2 12/11 > negative  Antimicrobials:  Cefepime 12/18>25 (7 day course)   Interim history/subjective:  Remains on trach collar since 12/27. No respiratory distress. Per RT, minimal thin secretions requiring suction 1-2 times daily.  Objective   Blood pressure 109/89, pulse 67, temperature 98.7 F (37.1 C), temperature source Oral, resp. rate (!) 26, height 5' 8.5" (1.74 m), weight 71.5 kg, SpO2 99 %.    FiO2 (%):  [28 %] 28 %   Intake/Output Summary (Last 24 hours) at 02/07/2019 1249 Last data filed at 02/07/2019 1135 Gross per 24 hour  Intake 237 ml  Output 2850 ml  Net -2613 ml   Filed Weights   02/05/19 0208 02/06/19  0659 02/07/19 0500  Weight: 72.2 kg 73.3 kg 71.5 kg   Physical Exam: General: Chronically ill-appearing, no acute distress, on trach collar HENT: [place], AT, OP clear, MMM Neck: Trach in place, c/d/i Eyes: EOMI, no scleral icterus Respiratory: Diminished breath sounds bilaterally. No crackles, wheezing or rales Cardiovascular: Irregularly irregular rate and rhythm, -M/R/G, no JVD GI: BS+, soft, nontender Extremities:-Edema,-tenderness Neuro: Opens eyes to voice, tracking, bilateral lower extremity spontaneously moving, not following commands  Physical Exam: General: Chronically ill-appearing, no acute distress, on trach collar HENT: [place], AT, OP clear, MMM Neck: #6 cuffed trach, c/d/i Eyes: EOMI, no scleral icterus Respiratory: Clear to auscultation bilaterally.  No crackles, wheezing or rales Cardiovascular: RRR, -M/R/G, no JVD GI: BS+, soft, nontender Extremities:-Edema,-tenderness Neuro: Sitting in bed, eyes open, does not follow commands, spontaneously moves lower extremities bilatearlly   Resolved Issues  Streptoccoccus pneumoniae and Enterobacter cloacae pneumonia s/p Cefepime x 7 days  Assessment & Plan:  68 year old male with atrial fibrillation on anticoagulation who presented for right-sided weakness and admitted to Neurology for left MCA stroke s/p tPA. He remained vent-dependent in setting of stroke. Developed VAP + Streptoccoccus pneumoniae and Enterobacter cloacae which was treated with IV antibiotics. For his prolonged need for mechanical ventilation and difficulty weaning off the vent, he had tracheostomy on 12/21. Has tolerated trach collar since 12/27. Minimal secretions present. Will plan to downsize to #4 cuffless after two weeks for stoma tract to mature.  Chronic respiratory failure due to L MCA CVA S/p tracheostomy 12/21 by PCCM  Plan: Tolerating trach collar Continue pulm  hygiene: PRN Duonebs Titrate O2 for sat of 88-92% On home lasix Speech for PMV  trials Continue aspiration precautions Plan to downsize trach to size 4 cuffless on 02/12/2019   Code Status: Full code Family Communication: Per primary team. Disposition: PCU  Care Time: 36 min  Pulmonary will follow 2-3 times per week  Rodman Pickle, M.D. [facility] Pulmonary/Critical Care Medicine 02/07/2019 12:49 PM

## 2019-02-08 DIAGNOSIS — Z431 Encounter for attention to gastrostomy: Secondary | ICD-10-CM

## 2019-02-08 LAB — BASIC METABOLIC PANEL
Anion gap: 10 (ref 5–15)
BUN: 20 mg/dL (ref 8–23)
CO2: 24 mmol/L (ref 22–32)
Calcium: 9.2 mg/dL (ref 8.9–10.3)
Chloride: 102 mmol/L (ref 98–111)
Creatinine, Ser: 0.72 mg/dL (ref 0.61–1.24)
GFR calc Af Amer: >60 mL/min (ref >60)
GFR calc non Af Amer: >60 mL/min (ref >60)
Glucose, Bld: 121 mg/dL — ABNORMAL HIGH (ref 70–99)
Potassium: 4 mmol/L (ref 3.5–5.1)
Sodium: 136 mmol/L (ref 135–145)

## 2019-02-08 LAB — CBC
HCT: 39.9 % (ref 39.0–52.0)
Hemoglobin: 13.5 g/dL (ref 13.0–17.0)
MCH: 34.5 pg — ABNORMAL HIGH (ref 26.0–34.0)
MCHC: 33.8 g/dL (ref 30.0–36.0)
MCV: 102 fL — ABNORMAL HIGH (ref 80.0–100.0)
Platelets: 829 10*3/uL — ABNORMAL HIGH (ref 150–400)
RBC: 3.91 MIL/uL — ABNORMAL LOW (ref 4.22–5.81)
RDW: 14.5 % (ref 11.5–15.5)
WBC: 11 10*3/uL — ABNORMAL HIGH (ref 4.0–10.5)
nRBC: 0 % (ref 0.0–0.2)

## 2019-02-08 LAB — GLUCOSE, CAPILLARY
Glucose-Capillary: 104 mg/dL — ABNORMAL HIGH (ref 70–99)
Glucose-Capillary: 133 mg/dL — ABNORMAL HIGH (ref 70–99)
Glucose-Capillary: 139 mg/dL — ABNORMAL HIGH (ref 70–99)
Glucose-Capillary: 181 mg/dL — ABNORMAL HIGH (ref 70–99)
Glucose-Capillary: 76 mg/dL (ref 70–99)
Glucose-Capillary: 79 mg/dL (ref 70–99)
Glucose-Capillary: 84 mg/dL (ref 70–99)

## 2019-02-08 LAB — HOMOCYSTEINE: Homocysteine: 19.8 umol/L — ABNORMAL HIGH (ref 0.0–17.2)

## 2019-02-08 LAB — PROTEIN S, TOTAL: Protein S Ag, Total: 105 % (ref 60–150)

## 2019-02-08 LAB — PROTEIN S ACTIVITY: Protein S Activity: 114 % (ref 63–140)

## 2019-02-08 LAB — PROTEIN C ACTIVITY: Protein C Activity: 128 % (ref 73–180)

## 2019-02-08 NOTE — Progress Notes (Signed)
  Speech Language Pathology Treatment: Dysphagia;[name] Speaking valve;Cognitive-Linquistic  Patient Details Name: [name] MRN: [id number] DOB: [birth year] Today's Date: 02/08/2019 Time: 1012-1026 SLP Time Calculation (min) (ACUTE ONLY): 14 min  Assessment / Plan / Recommendation Clinical Impression  Treatment focused primarily on communication goals (PMV/aphasia) as pt turned his head away from most boluses presented. He did take a few spoonfuls of water, but this resulted in subtle coughing concerning for reduced airway protection. He is going to need an instrumental swallow study at some point, but will need to take in more POs for it to be of more benefit to him.   Pt wore his PMV without overt difficulty and stable VS. He tried to speak occasionally but his vocal intensity is very low so I cannot distinguish what he says. SLP provided Max cues for command following with pt producing 3 commands across session. He did not engage in automatic speech tasks. Despite Max cues, he does not bring his gaze past midline. He will continue to need SLP f/u for swallowing and communication goals.   HPI HPI: Pt is a 68 y/o M admitted with L MCA CVA s/p TPA. He was intubated 12/11-12/13, reintubated 12/13; trach 12/21. PMH: tobacco use, previous strokes, GERD, COPD, AAA, PVD, HTN, HLD, pulmonary nodule, depression, afib (recently missed doses of coumadin)      SLP Plan  Continue with current plan of care       Recommendations  Diet recommendations: NPO Medication Administration: Via alternative means      Patient may use Passy-Muir Speech Valve: Intermittently with supervision PMSV Supervision: Full MD: Please consider changing trach tube to : Smaller size;Cuffless         Oral Care Recommendations: Oral care QID Follow up Recommendations: Skilled Nursing facility SLP Visit Diagnosis: Aphasia (R47.01) Plan: Continue with current plan of care       GO                  [name]., M.A. [facility] Pager [phone] Office [phone]  02/08/2019, 10:49 AM

## 2019-02-08 NOTE — Progress Notes (Signed)
STROKE TEAM PROGRESS NOTE   INTERVAL HISTORY Pt lying in bed, still on trach collar. Looking around in the room, more left gaze preference but able to gaze to right. Not blinking to visual threat on the right. Not following commands. No significant neuro change from yesterday. [name] will downsize to cuffless on 02/12/19.   OBJECTIVE Vitals:   02/08/19 0000 02/08/19 0400 02/08/19 0901 02/08/19 0910  BP:  (!) 115/96  (!) 136/119  Pulse:   82 82  Resp:  (!) 21 18 (!) 22  Temp: 97.9 F (36.6 C) 97.9 F (36.6 C)  99 F (37.2 C)  TempSrc: Oral Oral  Oral  SpO2:   98% 91%  Weight:      Height:        CBC:  Recent Labs  Lab 02/07/19 0539 02/08/19 0150  WBC 12.3* 11.0*  HGB 14.0 13.5  HCT 42.3 39.9  MCV 105.2* 102.0*  PLT 823* 829*    Basic Metabolic Panel:  Recent Labs  Lab 02/02/19 0317 02/05/19 0303 02/07/19 0539 02/08/19 0150  NA 138 136 135 136  K 3.7 3.8 4.0 4.0  CL 103 97* 99 102  CO2 25 28 25 24   GLUCOSE 95 101* 92 121*  BUN 19 19 20 20   CREATININE 0.62 0.77 0.78 0.72  CALCIUM 8.4* 9.0 9.4 9.2  MG 2.1 1.9  --   --   PHOS 2.8 3.8  --   --     Lipid Panel:     Component Value Date/Time   CHOL 238 (H) 01/19/2019 0437   TRIG 64 01/19/2019 0437   HDL 43 01/19/2019 0437   CHOLHDL 5.5 01/19/2019 0437   VLDL 13 01/19/2019 0437   LDLCALC 182 (H) 01/19/2019 0437   HgbA1c:  Lab Results  Component Value Date   HGBA1C 5.1 01/19/2019   Urine Drug Screen:     Component Value Date/Time   LABOPIA NONE DETECTED 07/13/2016 1428   COCAINSCRNUR NONE DETECTED 07/13/2016 1428   LABBENZ POSITIVE (A) 07/13/2016 1428   AMPHETMU NONE DETECTED 07/13/2016 1428   THCU POSITIVE (A) 07/13/2016 1428   LABBARB NONE DETECTED 07/13/2016 1428    Alcohol Level     Component Value Date/Time   ETH <5 02/09/2015 0010    IMAGING  CT HEAD WO CONTRAST 01/24/2019 IMPRESSION: Involving acute/subacute infarcts in the left frontal parietal lobe and left occipital lobe. Small  amount of hemorrhage in the left posterior frontal infarct unchanged from prior CT and MRI. Electronically Signed   By: Franchot Gallo M.D.   On: 01/24/2019 16:18   MRI Wo Contrast  01/20/2019 IMPRESSION: 1. Multifocal acute ischemia within the left MCA territory, including the left postcentral gyrus. No hemorrhage or mass effect. 2. 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 3. Chronic ischemic microangiopathy.  CT Head Wo Contrast  01/19/2019 IMPRESSION: Interval demarcation of an acute cortically based infarct within the lateral left frontal lobe as described. No acute intracranial hemorrhage or significant mass effect.  CT Code Stroke CTA Head W/WO contrast CT Code Stroke CTA Neck W/WO contrast 01/18/2019 IMPRESSION:  Atherosclerotic disease at both carotid bifurcations but no stenosis.  Prominent soft plaque at the left ICA bulb.  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  Dominant right vertebral artery widely patent.  No intracranial large or medium vessel occlusion or correctable proximal stenosis, with specific attention to the left MCA branches.  I do think there are a few  distal MCA branches that lose opacification and there could be a few small emboli in the left MCA territory. Vessels marked with arrows.  Distal vessel atherosclerotic irregularity, particularly evident in the PCA branches.  Aortic Atherosclerosis (ICD10-I70.0)  Emphysema (ICD10-J43.9).   CT HEAD CODE STROKE WO CONTRAST 01/18/2019  IMPRESSION: 1. No acute finding by CT. Chronic small-vessel ischemic changes of the white matter. Old right cerebellar and right occipital infarctions.  2. ASPECTS is 10   Portable Chest 1 View 02/04/19 Stable lung volumes and ventilation with veiling bibasilar and dense retrocardiac opacity. No superimposed pneumothorax and pulmonary vascularity in the upper lobes seems to remain normal. IMPRESSION: 1.  Feeding tube removed.  Stable tracheostomy. 2. Stable ventilation since 01/29/2019 with suspected bilateral pleural effusions and lower lobe collapse.  Transthoracic Echocardiogram  01/19/2019 IMPRESSIONS  1. Left ventricular ejection fraction, by visual estimation, is 55 to 60%. The left ventricle has normal function. There is moderately increased left ventricular hypertrophy.  2. The left ventricle demonstrates regional wall motion abnormalities.  3. Global right ventricle has normal systolic function.The right ventricular size is normal. No increase in right ventricular wall thickness.  4. Left atrial size was normal.  5. Right atrial size was mildly dilated.  6. The mitral valve is normal in structure. Mild to moderate mitral valve regurgitation.  7. The tricuspid valve is normal in structure. Tricuspid valve regurgitation is trivial.  8. The aortic valve is normal in structure. Aortic valve regurgitation is not visualized.  9. The pulmonic valve was normal in structure. Pulmonic valve regurgitation is not visualized. 10. The atrial septum is grossly normal.  ECG atrial fibrillation - ventricular response 133 BPM (See cardiology reading for complete details)  PHYSICAL EXAM   Blood pressure (!) 136/119, pulse 82, temperature 99 F (37.2 C), temperature source Oral, resp. rate (!) 22, height 5' 8.5" (1.74 m), weight 71.5 kg, SpO2 91 %.  General - frail elderly Caucasian male, on trach collar, awake and looking around  Ophthalmologic - fundi not visualized due to noncooperation.  Cardiovascular - irregularly irregular heart rate and rhythm.  Neuro - pt lying in bed, on trach collar, tolerating well. He is awake, eyes open, looking around, still not following commands. Eyes left gaze preference, able to cross midline, right gaze incomplete. No language output today with trach. Blinking to visual threat to the left but not to the ritght. Right neglect. PERRL. Right facial droop.  Tongue protrusion not cooperative. Left UE at least 4/5, purposeful movement against gravity. RUE 2/5 on pain stimulation, more at tricep. BLEs withdraw to pain, 4/5 on the left and 3/5 on the right. DTR 1+ and bilateral babinski. Sensation, coordination and gait not tested.   ASSESSMENT/PLAN [name] is a 68 y.o. male with history of tobacco use, previous strokes, COPD, AAA, ASPVD, HTN, HLD, pulmonary nodule, depression, atrial fibrillation on coumadin but recently missed doses presenting with transient aphasia and leaning to the right. The patient received IV t-PA Thursday 01/18/19 @ 2000.  Stroke - left MCA infarct s/p t-PA - embolic pattern, likely due to atrial fibrillation with subtherapeutic INR  Resultant global aphasia, left gaze, right hemiparesis, right neglect and agitation  Code Stroke CT Head - No acute finding by CT. Chronic small-vessel ischemic changes of the white matter. Old right cerebellar and right occipital infarctions. ASPECTS is 10   CT head - Interval demarcation of an acute cortically based infarct within the lateral left frontal lobe as described. No acute intracranial  hemorrhage or significant mass effect.  MRI head -  Multifocal acute ischemia within the left MCA territory, including the left postcentral gyrus. No hemorrhage or mass effect. 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 Chronic ischemic microangiopathy.  CTA H&N - Prominent soft plaque at the left ICA bulb. 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   CT 12/16 stable, continued evolution of left MCA infarcts  2D Echo - EF 55 to 60%. No cardiac source of emboli identified.   [employer] Virus 2 - negative  LDL - 182  HgbA1c - 5.1  Hypercoagulable work up ordered by hematology - pending (given on eliquis, some of these labs will not be accurate)  VTE prophylaxis -Heparin subq  warfarin daily prior to admission, now  on Eliquis  Therapy recommendations: SNF - can not d/c until 02/12/19 to downsize to cuffless trach  Disposition:  Pending  Afib with RVR  On amiodarone and Cardizem po  On metoprolol 100 bid   Not compliant with warfarin PTA, INR 1.1 on admission INR 1.2 on 02/02/2019  On heparin IV and coumadin  Switched to Eliquis 12/26  Respiratory failure with hypoxia, resolved  Intubated -> s/p trach 12/21 -> trach collar 12/27  Off vent 12/27  CCM will follow 2-3 times per week  Plan to downsize trach to size 4 cuffless on 1/4  B12 deficiency  Vitamin B12 = 161  B12 supplement  History of stroke  11/2014, bilateral blurry vision after left upper extremity bypass surgery.  Found to have left central vision loss.  MRI showed bilateral embolic infarcts.  INR 1.45.  Carotid Doppler negative.  TTE unremarkable.  LDL 82, A1c 5.4.  Continued on discharge with Coumadin and Lipitor 40  Hypotension, resolved Hx of hypertension  Home BP meds: Diltiazem, Lasix and metoprolol  Current BP meds clonidine 0.3 q 6h , metoprolol 25 bid  BP stable  Off neo  On Lasix 20 daily and metoprolol 100 twice daily now  Hyperlipidemia  Home Lipid lowering medication: Lipitor 40 mg daily  LDL 182, goal < 70  Current lipid lowering medication: Lipitor 80 mg daily   Continue statin at discharge  Dysphagia  On bolus TF now  S/p PEG 12/22  Speech on board  Continue NPO with alternative feeds  VAP Fever and leukocytosis  T-max 101.7->...afebrile    Leucocytosis - 7.8->9.0->15.6->16.9->15.6->13.2->12.6-12.6-12.3->11.5    CXR 02/04/19 - Stable ventilation since 01/29/2019 with suspected bilateral pleural effusions and lower lobe collapse.  UA neg 01/18/19  Was on cefepime (started 01/26/19 x 7 days) for S.pneumoniae and E.cloacae in sputum ->now off all antibiotics  CCM on board  Tobacco abuse  Current smoker  Smoking cessation counseling will be provided  Pt is willing to  quit  Thrombocytosis  Platelet 750->828->843->914-925->823->829  Hematology consulted  has large and giant PLTS on smear  Possibly reactive, doubt myeloproliferative d/c as normal PTA  Not a candidate for bone marrow bx  Hypercoagulable work up pending (on eliquis so some labs will not be accurate)  Placed on empiric hydroxyurea, plan increase if PLTS remain stable  Labs:  calreticulin pending, Jak2 pending   Fe deficiency  Fe 41, Fol 4.7, Sat ratios low  On feraheme  Other Stroke Risk Factors  Advanced age  ETOH use, advised to drink no more than 1 alcoholic beverage per day.  Hx narcotic dependence  AAA  PAD s/p L axilla to brachial and L axilla to ulnar  BPG  Other Active Problems  Hypokalemia 3.5->3.8->4.2->4.0->4.0    Oral thrush on nystatin   Palliative Care consult 12/25 -> brother wants aggressive care -> (Dr Leonie Man contacted [place] per family request; however,  [facility] would not agree to accept transfer)  Hospital day # 21  Rosalin Hawking, MD PhD Stroke Neurology 02/08/2019 11:10 AM

## 2019-02-08 NOTE — Progress Notes (Signed)
Physical Therapy Treatment Patient Details Name: [name] MRN: [id number] DOB: Feb 14, 1950 Today's Date: 02/08/2019    History of Present Illness 68yM admitted 01/20/19 with hypertensive emergency in setting of acute L MCA stroke s/p tPA, AF/RVR.  Has been noncompliant with his warfarin for A. Fib. Intubated for respiratory failure. Trach 01/29/19 with PEG planned for 01/30/19. No sedation at time of PT eval.     PT Comments    Pt less agitated with movement today.  Moving L side spontaneously, no spontaneous movement R side.  Time spent getting cervical extension.  Pt consistently wanting to "jack-knife" forward with chest to thighs.  Emphasis on transitions, sitting balance, sit to stand and transfer to chair.   Follow Up Recommendations  SNF;Supervision/Assistance - 24 hour;Other (comment)     Equipment Recommendations  Other (comment)    Recommendations for Other Services       Precautions / Restrictions Precautions Precautions: Fall Precaution Comments: multiple lines, on TC at 28%    Mobility  Bed Mobility Overal bed mobility: Needs Assistance Bed Mobility: Sidelying to Sit;Rolling Rolling: Mod assist Sidelying to sit: Max assist       General bed mobility comments: cues for direction, full  assist of trunk to come up and forward via L elbow and for scoot to EOB  Transfers Overall transfer level: Needs assistance   Transfers: Sit to/from Stand;Stand Pivot Transfers Sit to Stand: Max assist Stand pivot transfers: Max assist       General transfer comment: face to face assist to stand with cues and assist to come forward and boost.  mod assist to stand, pt adding minor weight onto his R LE, but the majority on the L LE.  pt needing max to pivot to the chair.  Ambulation/Gait             General Gait Details: unable   Stairs             Wheelchair Mobility    Modified Rankin (Stroke Patients Only) Modified Rankin (Stroke Patients  Only) Pre-Morbid Rankin Score: No symptoms Modified Rankin: Severe disability     Balance Overall balance assessment: Needs assistance Sitting-balance support: Single extremity supported;Bilateral upper extremity supported;Feet supported Sitting balance-Leahy Scale: Poor Sitting balance - Comments: pt consistently resisted sitting upright at EOB, needing maximal support, he pushed himself from L to R if relatively upright, needing moderate or more assist.  But pt could maintain sitting propped forward, elbows in lap with min guard.     Standing balance-Leahy Scale: Poor Standing balance comment: face to face standing assist                            Cognition Arousal/Alertness: Awake/alert Behavior During Therapy: Flat affect Overall Cognitive Status: Impaired/Different from baseline                   Orientation Level: Place;Time;Situation Current Attention Level: Focused   Following Commands: Follows one step commands inconsistently Safety/Judgement: Decreased awareness of safety;Decreased awareness of deficits Awareness: Intellectual Problem Solving: Slow processing;Decreased initiation;Difficulty sequencing;Requires verbal cues;Requires tactile cues        Exercises Other Exercises Other Exercises: Warm up ROM to LE with attempt to get movement to command.    General Comments General comments (skin integrity, edema, etc.): sats in the upper 90's on 28%TC,   Pt not following commands and is often resistant to directional cues.      Pertinent Vitals/Pain  Pain Assessment: Faces Faces Pain Scale: No hurt Pain Intervention(s): Monitored during session    Home Living                      Prior Function            PT Goals (current goals can now be found in the care plan section) Acute Rehab PT Goals Patient Stated Goal: none stated PT Goal Formulation: Patient unable to participate in goal setting Time For Goal Achievement:  02/20/19 Potential to Achieve Goals: Fair Progress towards PT goals: Progressing toward goals    Frequency    Min 2X/week      PT Plan Current plan remains appropriate    Co-evaluation              AM-PAC PT "6 Clicks" Mobility   Outcome Measure  Help needed turning from your back to your side while in a flat bed without using bedrails?: Total Help needed moving from lying on your back to sitting on the side of a flat bed without using bedrails?: Total Help needed moving to and from a bed to a chair (including a wheelchair)?: Total Help needed standing up from a chair using your arms (e.g., wheelchair or bedside chair)?: Total Help needed to walk in hospital room?: Total Help needed climbing 3-5 steps with a railing? : Total 6 Click Score: 6    End of Session   Activity Tolerance: Patient tolerated treatment well Patient left: in chair;with call bell/phone within reach;with chair alarm set Nurse Communication: Mobility status PT Visit Diagnosis: Hemiplegia and hemiparesis;Other abnormalities of gait and mobility (R26.89);Other symptoms and signs involving the nervous system (R29.898) Hemiplegia - Right/Left: Right Hemiplegia - dominant/non-dominant: Dominant Hemiplegia - caused by: Cerebral infarction     Time: 1328-1400 PT Time Calculation (min) (ACUTE ONLY): 32 min  Charges:  $Therapeutic Activity: 8-22 mins $Neuromuscular Re-education: 8-22 mins                     02/08/2019  [name], PT Acute Rehabilitation Services [phone]  (pager) [phone]  (office)   [name] Roisin Mones 02/08/2019, 3:44 PM

## 2019-02-08 NOTE — Progress Notes (Signed)
[name] is about the same from my perspective.  His platelet count is 829,000 today.  This is stable.  His white cell count is 11.5.  His hemoglobin is 13.5.  He is on Hydrea at 500 mg a day.  I would just continue him on this for right now.  If we do not see his platelet count dropping any further, then we will increase the dose.  He is still having the JAK2 and Calreticulin levels pending.  Neurologically, there may be some slight improvement but overall, I still think that the prognosis is probably not can be that great.  I still do not see that we had to put him through a bone marrow test.  I had to believe that the JAK2 and calreticulin will have some abnormality.  Lattie Haw, MD  Romans 5:3-5

## 2019-02-09 ENCOUNTER — Inpatient Hospital Stay (HOSPITAL_COMMUNITY): Payer: Medicare Other

## 2019-02-09 DIAGNOSIS — D75839 Thrombocytosis, unspecified: Secondary | ICD-10-CM

## 2019-02-09 DIAGNOSIS — Z8673 Personal history of transient ischemic attack (TIA), and cerebral infarction without residual deficits: Secondary | ICD-10-CM

## 2019-02-09 DIAGNOSIS — R451 Restlessness and agitation: Secondary | ICD-10-CM | POA: Diagnosis present

## 2019-02-09 DIAGNOSIS — J95851 Ventilator associated pneumonia: Secondary | ICD-10-CM | POA: Diagnosis not present

## 2019-02-09 DIAGNOSIS — D473 Essential (hemorrhagic) thrombocythemia: Secondary | ICD-10-CM

## 2019-02-09 DIAGNOSIS — D509 Iron deficiency anemia, unspecified: Secondary | ICD-10-CM | POA: Diagnosis present

## 2019-02-09 DIAGNOSIS — I69391 Dysphagia following cerebral infarction: Secondary | ICD-10-CM

## 2019-02-09 DIAGNOSIS — E538 Deficiency of other specified B group vitamins: Secondary | ICD-10-CM | POA: Diagnosis present

## 2019-02-09 LAB — URINALYSIS, COMPLETE (UACMP) WITH MICROSCOPIC
Bilirubin Urine: NEGATIVE
Glucose, UA: NEGATIVE mg/dL
Hgb urine dipstick: NEGATIVE
Ketones, ur: NEGATIVE mg/dL
Leukocytes,Ua: NEGATIVE
Nitrite: POSITIVE — AB
Protein, ur: NEGATIVE mg/dL
Specific Gravity, Urine: 1.01 (ref 1.005–1.030)
pH: 8 (ref 5.0–8.0)

## 2019-02-09 LAB — CBC
HCT: 41.9 % (ref 39.0–52.0)
Hemoglobin: 14.1 g/dL (ref 13.0–17.0)
MCH: 34.2 pg — ABNORMAL HIGH (ref 26.0–34.0)
MCHC: 33.7 g/dL (ref 30.0–36.0)
MCV: 101.7 fL — ABNORMAL HIGH (ref 80.0–100.0)
Platelets: 776 10*3/uL — ABNORMAL HIGH (ref 150–400)
RBC: 4.12 MIL/uL — ABNORMAL LOW (ref 4.22–5.81)
RDW: 14.3 % (ref 11.5–15.5)
WBC: 13 10*3/uL — ABNORMAL HIGH (ref 4.0–10.5)
nRBC: 0 % (ref 0.0–0.2)

## 2019-02-09 LAB — BASIC METABOLIC PANEL
Anion gap: 9 (ref 5–15)
BUN: 19 mg/dL (ref 8–23)
CO2: 28 mmol/L (ref 22–32)
Calcium: 9.6 mg/dL (ref 8.9–10.3)
Chloride: 101 mmol/L (ref 98–111)
Creatinine, Ser: 0.75 mg/dL (ref 0.61–1.24)
GFR calc Af Amer: >60 mL/min (ref >60)
GFR calc non Af Amer: >60 mL/min (ref >60)
Glucose, Bld: 54 mg/dL — ABNORMAL LOW (ref 70–99)
Potassium: 3.9 mmol/L (ref 3.5–5.1)
Sodium: 138 mmol/L (ref 135–145)

## 2019-02-09 LAB — CARDIOLIPIN ANTIBODIES, IGG, IGM, IGA
Anticardiolipin IgA: <9 APL U/mL (ref 0–11)
Anticardiolipin IgG: <9 GPL U/mL (ref 0–14)
Anticardiolipin IgM: 12 MPL U/mL (ref 0–12)

## 2019-02-09 LAB — GLUCOSE, CAPILLARY
Glucose-Capillary: 75 mg/dL (ref 70–99)
Glucose-Capillary: 104 mg/dL — ABNORMAL HIGH (ref 70–99)
Glucose-Capillary: 89 mg/dL (ref 70–99)
Glucose-Capillary: 159 mg/dL — ABNORMAL HIGH (ref 70–99)
Glucose-Capillary: 74 mg/dL (ref 70–99)
Glucose-Capillary: 117 mg/dL — ABNORMAL HIGH (ref 70–99)
Glucose-Capillary: 138 mg/dL — ABNORMAL HIGH (ref 70–99)

## 2019-02-09 LAB — BETA-2-GLYCOPROTEIN I ABS, IGG/M/A
Beta-2 Glyco I IgG: <9 GPI IgG units (ref 0–20)
Beta-2-Glycoprotein I IgA: <9 GPI IgA units (ref 0–25)
Beta-2-Glycoprotein I IgM: <9 GPI IgM units (ref 0–32)

## 2019-02-09 MED ORDER — QUETIAPINE FUMARATE 50 MG PO TABS
25.0000 mg | ORAL_TABLET | Freq: Two times a day (BID) | ORAL | Status: DC
  Administered 2019-02-09 – 2019-02-12 (×7): 25 mg via ORAL
  Filled 2019-02-09 (×7): qty 1

## 2019-02-09 NOTE — Progress Notes (Signed)
Pt. Was minimally restless this am, but has become increasingly agitated throughout the morning. Dr. Erlinda Hong ordered Seroquel to be given as well as a stat EKG, routine chest x-ray, and urinalysis. Pt. Handed off to [facility], RN at [facility].

## 2019-02-09 NOTE — Assessment & Plan Note (Signed)
L MCA d/t AF 01/2019

## 2019-02-09 NOTE — Progress Notes (Signed)
STROKE TEAM PROGRESS NOTE   INTERVAL HISTORY Pt lying in bed, restless, kicked pillow on the floor, removed left hand mitten. With talking and touching him, he became agitated and saying "god damn it". Left UE and LE strong, right UE and LE strength improving over the last a couple of days.    OBJECTIVE Vitals:   02/09/19 0418 02/09/19 0500 02/09/19 0813 02/09/19 0817  BP: (!) 132/93  (!) 153/98   Pulse: 79  91 90  Resp: 20  18 16   Temp: (!) 97.5 F (36.4 C)  97.6 F (36.4 C)   TempSrc: Oral  Axillary   SpO2: 95%  95% 96%  Weight:  71.2 kg    Height:        CBC:  Recent Labs  Lab 02/08/19 0150 02/09/19 0330  WBC 11.0* 13.0*  HGB 13.5 14.1  HCT 39.9 41.9  MCV 102.0* 101.7*  PLT 829* 776*    Basic Metabolic Panel:  Recent Labs  Lab 02/05/19 0303 02/08/19 0150 02/09/19 0330  NA 136 136 138  K 3.8 4.0 3.9  CL 97* 102 101  CO2 28 24 28   GLUCOSE 101* 121* 54*  BUN 19 20 19   CREATININE 0.77 0.72 0.75  CALCIUM 9.0 9.2 9.6  MG 1.9  --   --   PHOS 3.8  --   --     Lipid Panel:     Component Value Date/Time   CHOL 238 (H) 01/19/2019 0437   TRIG 64 01/19/2019 0437   HDL 43 01/19/2019 0437   CHOLHDL 5.5 01/19/2019 0437   VLDL 13 01/19/2019 0437   LDLCALC 182 (H) 01/19/2019 0437   HgbA1c:  Lab Results  Component Value Date   HGBA1C 5.1 01/19/2019   Urine Drug Screen:     Component Value Date/Time   LABOPIA NONE DETECTED 07/13/2016 1428   COCAINSCRNUR NONE DETECTED 07/13/2016 1428   LABBENZ POSITIVE (A) 07/13/2016 1428   AMPHETMU NONE DETECTED 07/13/2016 1428   THCU POSITIVE (A) 07/13/2016 1428   LABBARB NONE DETECTED 07/13/2016 1428    Alcohol Level     Component Value Date/Time   ETH <5 02/09/2015 0010    IMAGING  CT HEAD WO CONTRAST 01/24/2019 IMPRESSION: Involving acute/subacute infarcts in the left frontal parietal lobe and left occipital lobe. Small amount of hemorrhage in the left posterior frontal infarct unchanged from prior CT and MRI.  Electronically Signed   By: Franchot Gallo M.D.   On: 01/24/2019 16:18   MRI Wo Contrast  01/20/2019 IMPRESSION: 1. Multifocal acute ischemia within the left MCA territory, including the left postcentral gyrus. No hemorrhage or mass effect. 2. 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 3. Chronic ischemic microangiopathy.  CT Head Wo Contrast  01/19/2019 IMPRESSION: Interval demarcation of an acute cortically based infarct within the lateral left frontal lobe as described. No acute intracranial hemorrhage or significant mass effect.  CT Code Stroke CTA Head W/WO contrast CT Code Stroke CTA Neck W/WO contrast 01/18/2019 IMPRESSION:  Atherosclerotic disease at both carotid bifurcations but no stenosis.  Prominent soft plaque at the left ICA bulb.  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  Dominant right vertebral artery widely patent.  No intracranial large or medium vessel occlusion or correctable proximal stenosis, with specific attention to the left MCA branches.  I do think there are a few distal MCA branches that lose opacification and there could be a few small emboli in the  left MCA territory. Vessels marked with arrows.  Distal vessel atherosclerotic irregularity, particularly evident in the PCA branches.  Aortic Atherosclerosis (ICD10-I70.0)  Emphysema (ICD10-J43.9).   CT HEAD CODE STROKE WO CONTRAST 01/18/2019  IMPRESSION: 1. No acute finding by CT. Chronic small-vessel ischemic changes of the white matter. Old right cerebellar and right occipital infarctions.  2. ASPECTS is 10   Portable Chest 1 View 02/04/19 Stable lung volumes and ventilation with veiling bibasilar and dense retrocardiac opacity. No superimposed pneumothorax and pulmonary vascularity in the upper lobes seems to remain normal. IMPRESSION: 1. Feeding tube removed.  Stable tracheostomy. 2. Stable ventilation since 01/29/2019 with  suspected bilateral pleural effusions and lower lobe collapse.  Transthoracic Echocardiogram  01/19/2019 IMPRESSIONS  1. Left ventricular ejection fraction, by visual estimation, is 55 to 60%. The left ventricle has normal function. There is moderately increased left ventricular hypertrophy.  2. The left ventricle demonstrates regional wall motion abnormalities.  3. Global right ventricle has normal systolic function.The right ventricular size is normal. No increase in right ventricular wall thickness.  4. Left atrial size was normal.  5. Right atrial size was mildly dilated.  6. The mitral valve is normal in structure. Mild to moderate mitral valve regurgitation.  7. The tricuspid valve is normal in structure. Tricuspid valve regurgitation is trivial.  8. The aortic valve is normal in structure. Aortic valve regurgitation is not visualized.  9. The pulmonic valve was normal in structure. Pulmonic valve regurgitation is not visualized. 10. The atrial septum is grossly normal.  ECG atrial fibrillation - ventricular response 133 BPM (See cardiology reading for complete details)  PHYSICAL EXAM  Blood pressure (!) 153/98, pulse 90, temperature 97.6 F (36.4 C), temperature source Axillary, resp. rate 16, height 5' 8.5" (1.74 m), weight 71.2 kg, SpO2 96 %.  General - frail elderly Caucasian male, on trach collar, awake, looking around and restless  Ophthalmologic - fundi not visualized due to noncooperation.  Cardiovascular - irregularly irregular heart rate and rhythm.  Neuro - pt lying in bed, on trach collar, awake, eyes open, looking around, agitated with voice and touch, restless, still not following commands. Eyes left gaze preference, able to cross midline, right gaze incomplete. Able to say "god damn it" out from trach. Blinking to visual threat to the left but not to the ritght. Right neglect. PERRL. Right facial droop. Tongue protrusion not cooperative. Left UE at least 4/5,  purposeful strong movement against gravity. RUE 2/5 on pain stimulation, more at tricep. BLEs withdraw to pain, at least 4/5 on the left and 3-/5 on the right. DTR 1+ and bilateral babinski. Sensation, coordination and gait not tested.   ASSESSMENT/PLAN Mr. BARTLETT MUHLENKAMP is a 69 y.o. male with history of tobacco use, previous strokes, COPD, AAA, ASPVD, HTN, HLD, pulmonary nodule, depression, atrial fibrillation on coumadin but recently missed doses presenting with transient aphasia and leaning to the right. The patient received IV t-PA Thursday 01/18/19 @ 2000.  Stroke - left MCA infarct s/p t-PA - embolic pattern, likely due to atrial fibrillation with subtherapeutic INR  Resultant global aphasia, left gaze, right hemiparesis, right neglect and agitation  Code Stroke CT Head - No acute finding by CT. Chronic small-vessel ischemic changes of the white matter. Old right cerebellar and right occipital infarctions. ASPECTS is 10   CT head - Interval demarcation of an acute cortically based infarct within the lateral left frontal lobe as described. No acute intracranial hemorrhage or significant mass effect.  MRI head -  Multifocal acute ischemia within the left MCA territory, including the left postcentral gyrus. No hemorrhage or mass effect. 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 Chronic ischemic microangiopathy.  CTA H&N - Prominent soft plaque at the left ICA bulb. 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   CT 12/16 stable, continued evolution of left MCA infarcts  2D Echo - EF 55 to 60%. No cardiac source of emboli identified.   [employer] Virus 2 - negative  LDL - 182  HgbA1c - 5.1  Hypercoagulable work up ordered by hematology - mostly negative with some pending, Homocysteine 19.8 due to tobacco abuse  VTE prophylaxis -Heparin subq  warfarin daily prior to admission, now on Eliquis  Therapy  recommendations: SNF  Disposition:  Pending 02/12/19 to downsize to cuffless trach  Afib with RVR  On amiodarone and Cardizem po  On metoprolol 100 bid   Not compliant with warfarin PTA, INR 1.1 on admission INR 1.2 on 02/02/2019  On heparin IV and coumadin  Switched to Eliquis 12/26  Respiratory failure with hypoxia, resolved  Intubated -> s/p trach 12/21 -> trach collar 12/27  Off vent 12/27  CCM will follow 2-3 times per week  Plan to downsize trach to size 4 cuffless on 1/4  B12 deficiency  Vitamin B12 = 161  B12 supplement  History of stroke  11/2014, bilateral blurry vision after left upper extremity bypass surgery.  Found to have left central vision loss.  MRI showed bilateral embolic infarcts.  INR 1.45.  Carotid Doppler negative.  TTE unremarkable.  LDL 82, A1c 5.4.  Continued on discharge with Coumadin and Lipitor 40  Hypotension, resolved Hx of hypertension  Home BP meds: Diltiazem, Lasix and metoprolol  Current BP meds clonidine 0.3 q 6h , metoprolol 25 bid  BP stable  Off neo  On Lasix 20 daily and metoprolol 100 twice daily now  Hyperlipidemia  Home Lipid lowering medication: Lipitor 40 mg daily  LDL 182, goal < 70  Current lipid lowering medication: Lipitor 80 mg daily   Continue statin at discharge  Agitation   Initially difficult extubation due to agitation - was on ativan versed, and Risperdal.   Eventually extubated  Now more restless and agitated for the last 2-3 days  EKG stat pending  Will initiate seroquel 25mg  bid if EKG no QT prolongation  Dysphagia  On bolus TF now  S/p PEG 12/22  Speech on board  Continue NPO with alternative feeds  VAP Fever and leukocytosis  T-max 101.7->...afebrile    Leucocytosis - 7.8->9.0->15.6->16.9->15.6->13.2->12.6-12.6-12.3->11->13.0  CXR 02/04/19 - Stable ventilation since 01/29/2019 with suspected bilateral pleural effusions and lower lobe collapse.  UA neg  01/18/19  Repeat UA pending  Repeat CXR pending  Was on cefepime (started 01/26/19 x 7 days) for S.pneumoniae and E.cloacae in sputum ->now off all antibiotics  CCM on board  Tobacco abuse  Current smoker  Smoking cessation counseling will be provided  Pt is willing to quit  Likely the cause of mildly elevated homocysteine level  Thrombocytosis  Platelet 750->828->843->[id number]->823->829->776  Hematology consulted  has large and giant PLTS on smear  Possibly reactive, doubt myeloproliferative d/c as normal PTA  Not a candidate for bone marrow bx  Hypercoagulable work up largely negative with some pending  Placed on empiric hydroxyurea, plan increase if PLTS remain stable  Labs:  calreticulin pending, Jak2 pending   Fe deficiency  Fe 41, Fol 4.7, Sat ratios low  On  feraheme  Other Stroke Risk Factors  Advanced age  ETOH use, advised to drink no more than 1 alcoholic beverage per day.  Hx narcotic dependence  AAA  PAD s/p L axilla to brachial and L axilla to ulnar BPG  Other Active Problems  Hypokalemia 3.5->3.8->4.2->4.0->4.0->3.9  Oral thrush on nystatin   Palliative Care consult 12/25 -> brother wants aggressive care -> (Dr Leonie Man contacted [facility] per family request; however,  [facility] would not agree to accept transfer)  Hospital day # 22  Rosalin Hawking, MD PhD Stroke Neurology 02/09/2019 10:14 AM

## 2019-02-09 NOTE — Progress Notes (Signed)
Neurologically, [name] really is not much better.  I know he is getting very thorough physical therapy and speech therapy.  I know that neurology is definitely on top of everything.  His platelet count is coming down.  His platelet count today was 776,000.  This could be from the [facility].  Could also be from the iron that he received.  His hypercoagulable studies were all normal so far.  I doubt that there is a hypercoagulable issue with him.  There is no need to change the Hydrea dose.  His white cell count and hemoglobin are stable.  We will still await the results of the JAK2 assay in the Calreticulin assay.  I would expect that the should be back next week.  Lattie Haw, MD  [place] 12:12

## 2019-02-10 DIAGNOSIS — E538 Deficiency of other specified B group vitamins: Secondary | ICD-10-CM

## 2019-02-10 DIAGNOSIS — I63139 Cerebral infarction due to embolism of unspecified carotid artery: Secondary | ICD-10-CM

## 2019-02-10 DIAGNOSIS — I69391 Dysphagia following cerebral infarction: Secondary | ICD-10-CM

## 2019-02-10 LAB — LUPUS ANTICOAGULANT PANEL
DRVVT: 58.6 s — ABNORMAL HIGH (ref 0.0–47.0)
PTT Lupus Anticoagulant: 39.6 s (ref 0.0–51.9)

## 2019-02-10 LAB — GLUCOSE, CAPILLARY
Glucose-Capillary: 86 mg/dL (ref 70–99)
Glucose-Capillary: 94 mg/dL (ref 70–99)
Glucose-Capillary: 126 mg/dL — ABNORMAL HIGH (ref 70–99)
Glucose-Capillary: 87 mg/dL (ref 70–99)
Glucose-Capillary: 105 mg/dL — ABNORMAL HIGH (ref 70–99)

## 2019-02-10 LAB — DRVVT CONFIRM: dRVVT Confirm: 0.9 ratio (ref 0.8–1.2)

## 2019-02-10 LAB — DRVVT MIX: dRVVT Mix: 44.1 s — ABNORMAL HIGH (ref 0.0–40.4)

## 2019-02-10 MED ORDER — LORAZEPAM 2 MG/ML IJ SOLN
1.0000 mg | Freq: Once | INTRAMUSCULAR | Status: AC
  Administered 2019-02-10: 1 mg via INTRAVENOUS
  Filled 2019-02-10: qty 1

## 2019-02-10 MED ORDER — SODIUM CHLORIDE 0.9 % IV SOLN
1.0000 g | INTRAVENOUS | Status: AC
  Administered 2019-02-10 – 2019-02-11 (×2): 1 g via INTRAVENOUS
  Filled 2019-02-10 (×2): qty 1
  Filled 2019-02-10: qty 10

## 2019-02-10 NOTE — Progress Notes (Signed)
STROKE TEAM PROGRESS NOTE   INTERVAL HISTORY Patient was lying in bed this morning he was pleasant no agitation.  Urinalysis with positive nitrites and leukocytosis will start antibiotics today.  OBJECTIVE Vitals:   02/10/19 0314 02/10/19 0426 02/10/19 0800 02/10/19 1201  BP:  131/74 (!) 124/92 137/87  Pulse: 73 68 (!) 104 90  Resp: 17 14 20 20   Temp:  98.2 F (36.8 C) 97.9 F (36.6 C) 98.1 F (36.7 C)  TempSrc:  Axillary Axillary Axillary  SpO2: 100% 99% 98% 100%  Weight:      Height:        CBC:  Recent Labs  Lab 02/08/19 0150 02/09/19 0330  WBC 11.0* 13.0*  HGB 13.5 14.1  HCT 39.9 41.9  MCV 102.0* 101.7*  PLT 829* 776*    Basic Metabolic Panel:  Recent Labs  Lab 02/05/19 0303 02/08/19 0150 02/09/19 0330  NA 136 136 138  K 3.8 4.0 3.9  CL 97* 102 101  CO2 28 24 28   GLUCOSE 101* 121* 54*  BUN 19 20 19   CREATININE 0.77 0.72 0.75  CALCIUM 9.0 9.2 9.6  MG 1.9  --   --   PHOS 3.8  --   --     Lipid Panel:     Component Value Date/Time   CHOL 238 (H) 01/19/2019 0437   TRIG 64 01/19/2019 0437   HDL 43 01/19/2019 0437   CHOLHDL 5.5 01/19/2019 0437   VLDL 13 01/19/2019 0437   LDLCALC 182 (H) 01/19/2019 0437   HgbA1c:  Lab Results  Component Value Date   HGBA1C 5.1 01/19/2019   Urine Drug Screen:     Component Value Date/Time   LABOPIA NONE DETECTED 07/13/2016 1428   COCAINSCRNUR NONE DETECTED 07/13/2016 1428   LABBENZ POSITIVE (A) 07/13/2016 1428   AMPHETMU NONE DETECTED 07/13/2016 1428   THCU POSITIVE (A) 07/13/2016 1428   LABBARB NONE DETECTED 07/13/2016 1428    Alcohol Level     Component Value Date/Time   ETH <5 02/09/2015 0010    IMAGING  CT HEAD WO CONTRAST 01/24/2019 IMPRESSION: Involving acute/subacute infarcts in the left frontal parietal lobe and left occipital lobe. Small amount of hemorrhage in the left posterior frontal infarct unchanged from prior CT and MRI. Electronically Signed   By: Franchot Gallo M.D.   On: 01/24/2019  16:18   MRI Wo Contrast  01/20/2019 IMPRESSION: 1. Multifocal acute ischemia within the left MCA territory, including the left postcentral gyrus. No hemorrhage or mass effect. 2. 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 3. Chronic ischemic microangiopathy.  CT Head Wo Contrast  01/19/2019 IMPRESSION: Interval demarcation of an acute cortically based infarct within the lateral left frontal lobe as described. No acute intracranial hemorrhage or significant mass effect.  CT Code Stroke CTA Head W/WO contrast CT Code Stroke CTA Neck W/WO contrast 01/18/2019 IMPRESSION:  Atherosclerotic disease at both carotid bifurcations but no stenosis.  Prominent soft plaque at the left ICA bulb.  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  Dominant right vertebral artery widely patent.  No intracranial large or medium vessel occlusion or correctable proximal stenosis, with specific attention to the left MCA branches.  I do think there are a few distal MCA branches that lose opacification and there could be a few small emboli in the left MCA territory. Vessels marked with arrows.  Distal vessel atherosclerotic irregularity, particularly evident in the PCA branches.  Aortic Atherosclerosis (ICD10-I70.0)  Emphysema (ICD10-J43.9).   CT HEAD CODE STROKE WO CONTRAST 01/18/2019  IMPRESSION: 1. No acute finding by CT. Chronic small-vessel ischemic changes of the white matter. Old right cerebellar and right occipital infarctions.  2. ASPECTS is 10   Portable Chest 1 View 02/04/19 Stable lung volumes and ventilation with veiling bibasilar and dense retrocardiac opacity. No superimposed pneumothorax and pulmonary vascularity in the upper lobes seems to remain normal. IMPRESSION: 1. Feeding tube removed.  Stable tracheostomy. 2. Stable ventilation since 01/29/2019 with suspected bilateral pleural effusions and lower lobe  collapse.  Transthoracic Echocardiogram  01/19/2019 IMPRESSIONS  1. Left ventricular ejection fraction, by visual estimation, is 55 to 60%. The left ventricle has normal function. There is moderately increased left ventricular hypertrophy.  2. The left ventricle demonstrates regional wall motion abnormalities.  3. Global right ventricle has normal systolic function.The right ventricular size is normal. No increase in right ventricular wall thickness.  4. Left atrial size was normal.  5. Right atrial size was mildly dilated.  6. The mitral valve is normal in structure. Mild to moderate mitral valve regurgitation.  7. The tricuspid valve is normal in structure. Tricuspid valve regurgitation is trivial.  8. The aortic valve is normal in structure. Aortic valve regurgitation is not visualized.  9. The pulmonic valve was normal in structure. Pulmonic valve regurgitation is not visualized. 10. The atrial septum is grossly normal.  ECG atrial fibrillation - ventricular response 133 BPM (See cardiology reading for complete details)  PHYSICAL EXAM  Blood pressure 137/87, pulse 90, temperature 98.1 F (36.7 C), temperature source Axillary, resp. rate 20, height 5' 8.5" (1.74 m), weight 71.2 kg, SpO2 100 %.  General - frail elderly Caucasian male, on trach collar, awake, looking around and restless  Ophthalmologic - fundi not visualized due to noncooperation.  Cardiovascular - irregularly irregular heart rate and rhythm.  Neuro - pt lying in bed, on trach collar, awake, eyes open, looking around, not following commands, not agitated. Eyes left gaze preference, able to cross midline, right gaze incomplete. Able to say "god damn it" out from trach. Blinking to visual threat to the left but not to the ritght. Right neglect. PERRL. Right facial droop. Tongue protrusion not cooperative. Left UE at least 4/5, purposeful strong movement against gravity. RUE 2/5 on pain stimulation, more at tricep. BLEs  withdraw to pain, at least 4/5 on the left and 3-/5 on the right. DTR 1+ and bilateral babinski. Sensation, coordination and gait not tested.   ASSESSMENT/PLAN Mr. OBERON HAWLEY is a 69 y.o. male with history of tobacco use, previous strokes, COPD, AAA, ASPVD, HTN, HLD, pulmonary nodule, depression, atrial fibrillation on coumadin but recently missed doses presenting with transient aphasia and leaning to the right. The patient received IV t-PA Thursday 01/18/19 @ 2000.  Stroke - left MCA infarct s/p t-PA - embolic pattern, likely due to atrial fibrillation with subtherapeutic INR  Resultant global aphasia, left gaze, right hemiparesis, right neglect and agitation  Code Stroke CT Head - No acute finding by CT. Chronic small-vessel ischemic changes of the white matter. Old right cerebellar and right occipital infarctions. ASPECTS is 10   CT head - Interval demarcation of an acute cortically based infarct within the lateral left frontal lobe as described. No acute intracranial hemorrhage or significant mass effect.  MRI head -  Multifocal acute ischemia within the left MCA territory, including the left postcentral gyrus. No hemorrhage or mass effect. 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 Chronic ischemic microangiopathy.  CTA H&N - Prominent soft plaque at the left ICA bulb. 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   CT 12/16 stable, continued evolution of left MCA infarcts  2D Echo - EF 55 to 60%. No cardiac source of emboli identified.   [employer] Virus 2 - negative  LDL - 182  HgbA1c - 5.1  Hypercoagulable work up ordered by hematology - mostly negative with some pending, Homocysteine 19.8 due to tobacco abuse  VTE prophylaxis -Heparin subq  warfarin daily prior to admission, now on Eliquis  Therapy recommendations: SNF  Disposition:  Pending 02/12/19 to downsize to cuffless trach  Afib with RVR  On  amiodarone and Cardizem po  On metoprolol 100 bid   Not compliant with warfarin PTA, INR 1.1 on admission INR 1.2 on 02/02/2019  On heparin IV and coumadin  Switched to Eliquis 12/26  Respiratory failure with hypoxia, resolved  Intubated -> s/p trach 12/21 -> trach collar 12/27  Off vent 12/27  CCM will follow 2-3 times per week  Plan to downsize trach to size 4 cuffless on 1/4  B12 deficiency  Vitamin B12 = 161  B12 supplement  History of stroke  11/2014, bilateral blurry vision after left upper extremity bypass surgery.  Found to have left central vision loss.  MRI showed bilateral embolic infarcts.  INR 1.45.  Carotid Doppler negative.  TTE unremarkable.  LDL 82, A1c 5.4.  Continued on discharge with Coumadin and Lipitor 40  Hypotension, resolved Hx of hypertension  Home BP meds: Diltiazem, Lasix and metoprolol  Current BP meds clonidine 0.3 q 6h , metoprolol 25 bid  BP stable  Off neo  On Lasix 20 daily and metoprolol 100 twice daily now  Hyperlipidemia  Home Lipid lowering medication: Lipitor 40 mg daily  LDL 182, goal < 70  Current lipid lowering medication: Lipitor 80 mg daily   Continue statin at discharge  Agitation   Initially difficult extubation due to agitation - was on ativan versed, and Risperdal.   Eventually extubated  Now more restless and agitated for the last 2-3 days however today seemed better  EKG stat pending  Will initiate seroquel 25mg  bid if EKG no QT prolongation  Dysphagia  On bolus TF now  S/p PEG 12/22  Speech on board  Continue NPO with alternative feeds  VAP Fever and leukocytosis  T-max 101.7->...afebrile    Leucocytosis - 7.8->9.0->15.6->16.9->15.6->13.2->12.6-12.6-12.3->11->13.0  CXR 02/04/19 - Stable ventilation since 01/29/2019 with suspected bilateral pleural effusions and lower lobe collapse.  UA neg 01/18/19  Repeat UA pending  Repeat CXR pending  Was on cefepime (started 01/26/19 x 7  days) for S.pneumoniae and E.cloacae in sputum ->now off all antibiotics  CCM on board  Tobacco abuse  Current smoker  Smoking cessation counseling will be provided  Pt is willing to quit  Likely the cause of mildly elevated homocysteine level  Thrombocytosis  Platelet 750->828->843->914-925->823->829->776  Hematology consulted  has large and giant PLTS on smear  Possibly reactive, doubt myeloproliferative d/c as normal PTA  Not a candidate for bone marrow bx  Hypercoagulable work up largely negative with some pending  Placed on empiric hydroxyurea, plan increase if PLTS remain stable  Labs:  calreticulin pending, Jak2 pending   Fe deficiency  Fe 41, Fol 4.7, Sat ratios low  On feraheme  Other Stroke Risk Factors  Advanced age  ETOH use, advised to drink no more than 1 alcoholic beverage per day.  Hx narcotic dependence  AAA  PAD s/p L axilla to brachial and L axilla to ulnar BPG  Other Active Problems  Hypokalemia 3.5->3.8->4.2->4.0->4.0->3.9  Oral thrush on nystatin   Palliative Care consult 12/25 -> brother wants aggressive care -> (Dr Leonie Man contacted [facility] per family request; however,  [facility] would not agree to accept transfer)  Leukocytosis with +nitrite UA. Check Ucx. Start Rocephin for 3 days.  Hospital day # 81   Personally examined patient and images, and have participated in and made any corrections needed to history, physical, neuro exam,assessment and plan as stated above.  I have personally obtained the history, evaluated lab date, reviewed imaging studies and agree with radiology interpretations.    Sarina Ill, MD Stroke Neurology   A total of 35 minutes was spent for the care of this patient, spent on counseling patient and family on different diagnostic and therapeutic options, counseling and coordination of care, riskd ans benefits of management, compliance, or risk factor reduction and education.

## 2019-02-11 LAB — GLUCOSE, CAPILLARY
Glucose-Capillary: 113 mg/dL — ABNORMAL HIGH (ref 70–99)
Glucose-Capillary: 144 mg/dL — ABNORMAL HIGH (ref 70–99)
Glucose-Capillary: 166 mg/dL — ABNORMAL HIGH (ref 70–99)
Glucose-Capillary: 188 mg/dL — ABNORMAL HIGH (ref 70–99)
Glucose-Capillary: 86 mg/dL (ref 70–99)
Glucose-Capillary: 89 mg/dL (ref 70–99)

## 2019-02-11 MED ORDER — CHLORHEXIDINE GLUCONATE 0.12 % MT SOLN
15.0000 mL | Freq: Two times a day (BID) | OROMUCOSAL | Status: DC
  Administered 2019-02-12 – 2019-02-24 (×26): 15 mL via OROMUCOSAL
  Filled 2019-02-11 (×25): qty 15

## 2019-02-11 MED ORDER — ORAL CARE MOUTH RINSE
15.0000 mL | Freq: Two times a day (BID) | OROMUCOSAL | Status: DC
  Administered 2019-02-12 – 2019-02-24 (×14): 15 mL via OROMUCOSAL

## 2019-02-11 NOTE — Progress Notes (Signed)
STROKE TEAM PROGRESS NOTE   INTERVAL HISTORY Patient was lying in bed this morning he was slightly agitated and nonverbal.  Urinalysis with positive nitrites and leukocytosis started antibiotics Saturday.  OBJECTIVE Vitals:   02/10/19 2341 02/11/19 0012 02/11/19 0344 02/11/19 0413  BP: (!) 125/107 110/80 123/85   Pulse: 73 80 86 71  Resp: 16 19 (!) 22 17  Temp:  97.9 F (36.6 C) 97.8 F (36.6 C)   TempSrc:  Oral Oral   SpO2: 100% 100% 100% 100%  Weight:      Height:        CBC:  Recent Labs  Lab 02/08/19 0150 02/09/19 0330  WBC 11.0* 13.0*  HGB 13.5 14.1  HCT 39.9 41.9  MCV 102.0* 101.7*  PLT 829* 776*    Basic Metabolic Panel:  Recent Labs  Lab 02/05/19 0303 02/08/19 0150 02/09/19 0330  NA 136 136 138  K 3.8 4.0 3.9  CL 97* 102 101  CO2 28 24 28   GLUCOSE 101* 121* 54*  BUN 19 20 19   CREATININE 0.77 0.72 0.75  CALCIUM 9.0 9.2 9.6  MG 1.9  --   --   PHOS 3.8  --   --     Lipid Panel:     Component Value Date/Time   CHOL 238 (H) 01/19/2019 0437   TRIG 64 01/19/2019 0437   HDL 43 01/19/2019 0437   CHOLHDL 5.5 01/19/2019 0437   VLDL 13 01/19/2019 0437   LDLCALC 182 (H) 01/19/2019 0437   HgbA1c:  Lab Results  Component Value Date   HGBA1C 5.1 01/19/2019   Urine Drug Screen:     Component Value Date/Time   LABOPIA NONE DETECTED 07/13/2016 1428   COCAINSCRNUR NONE DETECTED 07/13/2016 1428   LABBENZ POSITIVE (A) 07/13/2016 1428   AMPHETMU NONE DETECTED 07/13/2016 1428   THCU POSITIVE (A) 07/13/2016 1428   LABBARB NONE DETECTED 07/13/2016 1428    Alcohol Level     Component Value Date/Time   ETH <5 02/09/2015 0010    IMAGING  CT HEAD WO CONTRAST 01/24/2019 IMPRESSION: Involving acute/subacute infarcts in the left frontal parietal lobe and left occipital lobe. Small amount of hemorrhage in the left posterior frontal infarct unchanged from prior CT and MRI. Electronically Signed   By: Franchot Gallo M.D.   On: 01/24/2019 16:18   MRI Wo  Contrast  01/20/2019 IMPRESSION: 1. Multifocal acute ischemia within the left MCA territory, including the left postcentral gyrus. No hemorrhage or mass effect. 2. 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 3. Chronic ischemic microangiopathy.  CT Head Wo Contrast  01/19/2019 IMPRESSION: Interval demarcation of an acute cortically based infarct within the lateral left frontal lobe as described. No acute intracranial hemorrhage or significant mass effect.  CT Code Stroke CTA Head W/WO contrast CT Code Stroke CTA Neck W/WO contrast 01/18/2019 IMPRESSION:  Atherosclerotic disease at both carotid bifurcations but no stenosis.  Prominent soft plaque at the left ICA bulb.  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  Dominant right vertebral artery widely patent.  No intracranial large or medium vessel occlusion or correctable proximal stenosis, with specific attention to the left MCA branches.  I do think there are a few distal MCA branches that lose opacification and there could be a few small emboli in the left MCA territory. Vessels marked with arrows.  Distal vessel atherosclerotic irregularity, particularly evident in the PCA branches.  Aortic Atherosclerosis (ICD10-I70.0)  Emphysema (ICD10-J43.9).  CT HEAD CODE STROKE WO CONTRAST 01/18/2019  IMPRESSION: 1. No acute finding by CT. Chronic small-vessel ischemic changes of the white matter. Old right cerebellar and right occipital infarctions.  2. ASPECTS is 10   Portable Chest 1 View 02/04/19 Stable lung volumes and ventilation with veiling bibasilar and dense retrocardiac opacity. No superimposed pneumothorax and pulmonary vascularity in the upper lobes seems to remain normal. IMPRESSION: 1. Feeding tube removed.  Stable tracheostomy. 2. Stable ventilation since 01/29/2019 with suspected bilateral pleural effusions and lower lobe collapse.  Portable Chest 1  View 02/09/2019 IMPRESSION: 1. Improved aeration at the right lung base. 2. Mildly increased left basilar consolidation which may represent atelectasis. Probable small left pleural effusion.  Transthoracic Echocardiogram  01/19/2019 IMPRESSIONS  1. Left ventricular ejection fraction, by visual estimation, is 55 to 60%. The left ventricle has normal function. There is moderately increased left ventricular hypertrophy.  2. The left ventricle demonstrates regional wall motion abnormalities.  3. Global right ventricle has normal systolic function.The right ventricular size is normal. No increase in right ventricular wall thickness.  4. Left atrial size was normal.  5. Right atrial size was mildly dilated.  6. The mitral valve is normal in structure. Mild to moderate mitral valve regurgitation.  7. The tricuspid valve is normal in structure. Tricuspid valve regurgitation is trivial.  8. The aortic valve is normal in structure. Aortic valve regurgitation is not visualized.  9. The pulmonic valve was normal in structure. Pulmonic valve regurgitation is not visualized. 10. The atrial septum is grossly normal.  ECG atrial fibrillation - ventricular response 133 BPM (See cardiology reading for complete details)  PHYSICAL EXAM  Blood pressure 123/85, pulse 71, temperature 97.8 F (36.6 C), temperature source Oral, resp. rate 17, height 5' 8.5" (1.74 m), weight 71.2 kg, SpO2 100 %.  General - frail elderly Caucasian male, on trach collar, awake, looking around and restless  Ophthalmologic - fundi not visualized due to noncooperation.  Cardiovascular - irregularly irregular heart rate and rhythm.  Neuro - pt lying in bed, on trach collar, awake, eyes open, looking around, not following commands, not agitated. Eyes left gaze preference, able to cross midline, right gaze incomplete. Able to say "god damn it" out from trach. Blinking to visual threat to the left but not to the ritght. Right  neglect. PERRL. Right facial droop. Tongue protrusion not cooperative. Left UE at least 4/5, purposeful strong movement against gravity. RUE 2/5 on pain stimulation, more at tricep. BLEs withdraw to pain, at least 4/5 on the left and 3-/5 on the right. DTR 1+ and bilateral babinski. Sensation, coordination and gait not tested.   ASSESSMENT/PLAN Mr. KAILUM OUIMETTE is a 69 y.o. male with history of tobacco use, previous strokes, COPD, AAA, ASPVD, HTN, HLD, pulmonary nodule, depression, atrial fibrillation on coumadin but recently missed doses presenting with transient aphasia and leaning to the right. The patient received IV t-PA Thursday 01/18/19 @ 2000.  Stroke - left MCA infarct s/p t-PA - embolic pattern, likely due to atrial fibrillation with subtherapeutic INR  Resultant global aphasia, left gaze, right hemiparesis, right neglect and agitation  Code Stroke CT Head - No acute finding by CT. Chronic small-vessel ischemic changes of the white matter. Old right cerebellar and right occipital infarctions. ASPECTS is 10   CT head - Interval demarcation of an acute cortically based infarct within the lateral left frontal lobe as described. No acute intracranial hemorrhage or significant mass effect.  MRI head -  Multifocal acute  ischemia within the left MCA territory, including the left postcentral gyrus. No hemorrhage or mass effect. 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 Chronic ischemic microangiopathy.  CTA H&N - Prominent soft plaque at the left ICA bulb. 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   CT 12/16 stable, continued evolution of left MCA infarcts  2D Echo - EF 55 to 60%. No cardiac source of emboli identified.   [employer] Virus 2 - negative  LDL - 182  HgbA1c - 5.1  Hypercoagulable work up ordered by hematology - mostly negative with some pending, Homocysteine 19.8 due to tobacco abuse  VTE  prophylaxis -Heparin subq  warfarin daily prior to admission, now on Eliquis  Therapy recommendations: SNF  Disposition:  Pending 02/12/19 to downsize to cuffless trach  Afib with RVR  On amiodarone and Cardizem po  On metoprolol 100 bid   Not compliant with warfarin PTA, INR 1.1 on admission INR 1.2 on 02/02/2019  On heparin IV and coumadin  Switched to Eliquis 12/26  Respiratory failure with hypoxia, resolved  Intubated -> s/p trach 12/21 -> trach collar 12/27  Off vent 12/27  CCM will follow 2-3 times per week  Plan to downsize trach to size 4 cuffless on 1/4  B12 deficiency  Vitamin B12 = 161  B12 supplement  History of stroke  11/2014, bilateral blurry vision after left upper extremity bypass surgery.  Found to have left central vision loss.  MRI showed bilateral embolic infarcts.  INR 1.45.  Carotid Doppler negative.  TTE unremarkable.  LDL 82, A1c 5.4.  Continued on discharge with Coumadin and Lipitor 40  Hypotension, resolved Hx of hypertension  Home BP meds: Diltiazem, Lasix and metoprolol  Current BP meds clonidine 0.3 q 6h , metoprolol 25 bid  BP stable  Off neo  On Lasix 20 daily and metoprolol 100 twice daily now  Hyperlipidemia  Home Lipid lowering medication: Lipitor 40 mg daily  LDL 182, goal < 70  Current lipid lowering medication: Lipitor 80 mg daily   Continue statin at discharge  Agitation   Initially difficult extubation due to agitation - was on ativan versed, and Risperdal.   Eventually extubated  Now more restless and agitated for the last 2-3 days however today seemed better  EKG stat pending  Will initiate seroquel 25mg  bid if EKG no QT prolongation  Dysphagia  On bolus TF now  S/p PEG 12/22  Speech on board  Continue NPO with alternative feeds  VAP Fever and leukocytosis  T-max 101.7->...afebrile    Leucocytosis - 7.8->9.0->15.6->16.9->15.6->13.2->12.6-12.6-12.3->11->13.0 (afebrile)  CXR 02/04/19 -  Stable ventilation since 01/29/2019 with suspected bilateral pleural effusions and lower lobe collapse.  UA neg 01/18/19  Repeat UA - abnormal (nitrite positive with many bacteria but no WBCs) - culture - pending  Repeat CXR 01/09/2020 - Mildly increased left basilar consolidation which may represent atelectasis. Probable small left pleural effusion. Repeat CBC in AM.  Was on cefepime (started 01/26/19 x 7 days) for S.pneumoniae and E.cloacae in sputum ->now off all antibiotics  CCM on board  Tobacco abuse  Current smoker  Smoking cessation counseling will be provided  Pt is willing to quit  Likely the cause of mildly elevated homocysteine level  Thrombocytosis  Platelet 750->828->843->[id number]->823->829->776  Hematology consulted  has large and giant PLTS on smear  Possibly reactive, doubt myeloproliferative d/c as normal PTA  Not a candidate for bone marrow bx  Hypercoagulable work up largely negative  with some pending  Placed on empiric hydroxyurea, plan increase if PLTS remain stable  Labs:  calreticulin pending, Jak2 pending   Fe deficiency  Fe 41, Fol 4.7, Sat ratios low  On feraheme  Other Stroke Risk Factors  Advanced age  ETOH use, advised to drink no more than 1 alcoholic beverage per day.  Hx narcotic dependence  AAA  PAD s/p L axilla to brachial and L axilla to ulnar BPG  Other Active Problems  Hypokalemia 3.5->3.8->4.2->4.0->4.0->3.9  Oral thrush on nystatin   Palliative Care consult 12/25 -> brother wants aggressive care -> (Dr Leonie Man contacted [facility] per family request; however,  [facility] would not agree to accept transfer)  Leukocytosis with +nitrite UA. Check Ucx. Start Rocephin for 3 days.  Hospital day # 24   Personally examined patient and images, and have participated in and made any corrections needed to history, physical, neuro exam,assessment and plan as stated above.  I have personally obtained the history, evaluated lab date,  reviewed imaging studies and agree with radiology interpretations.   A total of 15 minutes was spent for the care of this patient, spent on counseling patient and family on different diagnostic and therapeutic options, counseling and coordination of care, riskd ans benefits of management, compliance, or risk factor reduction and education.

## 2019-02-12 DIAGNOSIS — J988 Other specified respiratory disorders: Secondary | ICD-10-CM

## 2019-02-12 DIAGNOSIS — I6310 Cerebral infarction due to embolism of unspecified precerebral artery: Secondary | ICD-10-CM

## 2019-02-12 LAB — CBC
HCT: 39.5 % (ref 39.0–52.0)
Hemoglobin: 13.3 g/dL (ref 13.0–17.0)
MCH: 34.5 pg — ABNORMAL HIGH (ref 26.0–34.0)
MCHC: 33.7 g/dL (ref 30.0–36.0)
MCV: 102.3 fL — ABNORMAL HIGH (ref 80.0–100.0)
Platelets: 522 10*3/uL — ABNORMAL HIGH (ref 150–400)
RBC: 3.86 MIL/uL — ABNORMAL LOW (ref 4.22–5.81)
RDW: 14.4 % (ref 11.5–15.5)
WBC: 15 10*3/uL — ABNORMAL HIGH (ref 4.0–10.5)
nRBC: 0 % (ref 0.0–0.2)

## 2019-02-12 LAB — JAK2 GENOTYPR

## 2019-02-12 LAB — GLUCOSE, CAPILLARY
Glucose-Capillary: 91 mg/dL (ref 70–99)
Glucose-Capillary: 91 mg/dL (ref 70–99)
Glucose-Capillary: 87 mg/dL (ref 70–99)
Glucose-Capillary: 100 mg/dL — ABNORMAL HIGH (ref 70–99)
Glucose-Capillary: 150 mg/dL — ABNORMAL HIGH (ref 70–99)
Glucose-Capillary: 79 mg/dL (ref 70–99)
Glucose-Capillary: 130 mg/dL — ABNORMAL HIGH (ref 70–99)

## 2019-02-12 LAB — PROTEIN C, TOTAL: Protein C, Total: 92 % (ref 60–150)

## 2019-02-12 LAB — FACTOR 5 LEIDEN

## 2019-02-12 MED ORDER — SODIUM CHLORIDE 0.9 % IV SOLN
1.0000 g | Freq: Once | INTRAVENOUS | Status: AC
  Administered 2019-02-12: 1 g via INTRAVENOUS
  Filled 2019-02-12: qty 1

## 2019-02-12 MED ORDER — CYANOCOBALAMIN 1000 MCG/ML IJ SOLN
1000.0000 ug | Freq: Every day | INTRAMUSCULAR | Status: DC
  Administered 2019-02-12 – 2019-02-16 (×5): 1000 ug via INTRAMUSCULAR
  Filled 2019-02-12 (×5): qty 1

## 2019-02-12 MED ORDER — FOLIC ACID 1 MG PO TABS
2.0000 mg | ORAL_TABLET | Freq: Every day | ORAL | Status: DC
  Administered 2019-02-12 – 2019-03-03 (×19): 2 mg via ORAL
  Filled 2019-02-12 (×20): qty 2

## 2019-02-12 MED ORDER — VALPROIC ACID 250 MG/5ML PO SOLN
250.0000 mg | Freq: Three times a day (TID) | ORAL | Status: DC
  Administered 2019-02-12 – 2019-02-14 (×8): 250 mg via ORAL
  Filled 2019-02-12 (×9): qty 5

## 2019-02-12 MED ORDER — QUETIAPINE FUMARATE 25 MG PO TABS
37.5000 mg | ORAL_TABLET | Freq: Two times a day (BID) | ORAL | Status: DC
  Administered 2019-02-12 – 2019-02-14 (×5): 37.5 mg via ORAL
  Filled 2019-02-12 (×6): qty 1

## 2019-02-12 NOTE — Progress Notes (Addendum)
HEMATOLOGY-ONCOLOGY PROGRESS NOTE  SUBJECTIVE: The patient is more awake and attempts to talk to me.  Difficult to understand.  He offers no complaints today.  No bleeding noted.  REVIEW OF SYSTEMS:   Unable to obtain secondary to patient condition  I have reviewed the past medical history, past surgical history, social history and family history with the patient and they are unchanged from previous note.   PHYSICAL EXAMINATION:  Vitals:   02/12/19 1136 02/12/19 1149  BP: (!) 151/71   Pulse: 75 72  Resp: 18 14  Temp: 97.6 F (36.4 C)   SpO2: 100% 100%   Filed Weights   02/09/19 0500 02/11/19 0500 02/12/19 0500  Weight: 156 lb 15.5 oz (71.2 kg) 157 lb 3 oz (71.3 kg) 146 lb 2.6 oz (66.3 kg)    Intake/Output from previous day: 01/03 0701 - 01/04 0700 In: 474 [NG/GT:474] Out: 3650 [Urine:3650]  GENERAL: Chronically ill-appearing male, no distress SKIN: skin color, texture, turgor are normal, no rashes or significant lesions EYES: normal, Conjunctiva are pink and non-injected, sclera clear  NECK: Tracheostomy midline LYMPH:  no palpable lymphadenopathy in the cervical, axillary or inguinal LUNGS: Diminished breath sounds HEART: Irregular, no edema ABDOMEN:abdomen soft, non-tender and normal bowel sounds Musculoskeletal:no cyanosis of digits and no clubbing  NEURO: Opens eyes to voice, does not follow commands, he is moving his left arm and bilateral legs spontaneously  LABORATORY DATA:  I have reviewed the data as listed CMP Latest Ref Rng & Units 02/09/2019 02/08/2019 02/07/2019  Glucose 70 - 99 mg/dL 54(L) 121(H) 92  BUN 8 - 23 mg/dL _0 Creatinine 0.61 - 1.24 mg/dL 0.75 0.72 0.78  Sodium 135 - 145 mmol/L 138 136 135  Potassium 3.5 - 5.1 mmol/L 3.9 4.0 4.0  Chloride 98 - 111 mmol/L 101 102 99  CO2 22 - 32 mmol/L _1 Calcium 8.9 - 10.3 mg/dL 9.6 9.2 9.4  Total Protein 6.5 - 8.1 g/dL - - -  Total Bilirubin 0.3 - 1.2 mg/dL - - -  Alkaline Phos 38 - 126 U/L -  - -  AST 15 - 41 U/L - - -  ALT 0 - 44 U/L - - -    Lab Results  Component Value Date   WBC 15.0 (H) 02/12/2019   HGB 13.3 02/12/2019   HCT 39.5 02/12/2019   MCV 102.3 (H) 02/12/2019   PLT 522 (H) 02/12/2019   NEUTROABS 6.8 01/18/2019    CT Code Stroke CTA Head W/WO contrast  Result Date: 01/18/2019 CLINICAL DATA:  Acute left MCA occlusion syndrome. EXAM: CT ANGIOGRAPHY HEAD AND NECK TECHNIQUE: Multidetector CT imaging of the head and neck was performed using the standard protocol during bolus administration of intravenous contrast. Multiplanar CT image reconstructions and MIPs were obtained to evaluate the vascular anatomy. Carotid stenosis measurements (when applicable) are obtained utilizing NASCET criteria, using the distal internal carotid diameter as the denominator. CONTRAST:  163m OMNIPAQUE IOHEXOL 350 MG/ML SOLN COMPARISON:  Head CT earlier same day FINDINGS: CTA NECK FINDINGS Aortic arch: Aortic atherosclerosis. No aneurysm or dissection. Branching pattern is normal without origin stenosis. Right carotid system: Common carotid artery is widely patent to the bifurcation region. Soft and calcified plaque at the carotid bifurcation and ICA bulb but no stenosis. Cervical ICA is patent to the skull base. Left carotid system: Common carotid artery patent to the bifurcation. Calcified and soft plaque at the carotid bifurcation and ICA bulb. No stenosis. Cervical ICA widely patent.  Vertebral arteries: Dominant right vertebral artery is widely patent at the origin and through the cervical region to the foramen magnum. Left vertebral artery is occluded at the origin and reconstitutes in the distal cervical region from collaterals. Skeleton: Mild midcervical spondylosis. Other neck: No mass or lymphadenopathy. Upper chest: Emphysema. No focal or active process otherwise. CTA HEAD FINDINGS Anterior circulation: Both internal carotid arteries are patent through the siphon regions. No siphon stenosis.  Anterior and middle cerebral vessels are patent without flow limiting proximal stenosis, aneurysm or vascular malformation. Both anterior cerebral arteries receive there supply from the left carotid circulation. I do not see any large or medium vessel occlusion, with particular attention to the left MCA branches. I think there may be a few M3 to M4 branches on the left that lose opacification distally and there could be a few small left MCA territory emboli. Posterior circulation: Both vertebral arteries show flow at the foramen magnum with the right being dominant. Bilateral PICA flow demonstrated. No basilar stenosis. Superior cerebellar and posterior cerebral arteries show flow. Distal PCA branches show atherosclerotic irregularity. Venous sinuses: Patent and normal. Anatomic variants: None significant otherwise. Review of the MIP images confirms the above findings IMPRESSION: Atherosclerotic disease at both carotid bifurcations but no stenosis. Prominent soft plaque at the left ICA bulb. 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 Dominant right vertebral artery widely patent. No intracranial large or medium vessel occlusion or correctable proximal stenosis, with specific attention to the left MCA branches. I do think there are a few distal MCA branches that lose opacification and there could be a few small emboli in the left MCA territory. Vessels marked with arrows. Distal vessel atherosclerotic irregularity, particularly evident in the PCA branches. Aortic Atherosclerosis (ICD10-I70.0) and Emphysema (ICD10-J43.9). Electronically Signed   By: Nelson Chimes M.D.   On: 01/18/2019 19:59   CT HEAD WO CONTRAST  Result Date: 01/24/2019 CLINICAL DATA:  Stroke follow-up EXAM: CT HEAD WITHOUT CONTRAST TECHNIQUE: Contiguous axial images were obtained from the base of the skull through the vertex without intravenous contrast. COMPARISON:  CT head  01/19/2019, MRI 12 FINDINGS: Brain: Evolving area of infarction in the left temporal and parietal lobe. Small amount of hemorrhage is present in the infarct as noted on MRI prior CT, unchanged. Hypodensity left occipital lobe compatible with evolving recent infarct. This is difficult to see on the prior CT but is noted on the prior MRI. No associated hemorrhage. Chronic infarct right cerebellum is unchanged. Mild atrophy. Chronic microvascular ischemic changes in the white matter. Negative for hydrocephalus Vascular: Negative for hyperdense vessel Skull: Negative Sinuses/Orbits: Mucosal edema throughout the paranasal sinuses. Negative orbit. Other: None IMPRESSION: Involving acute/subacute infarcts in the left frontal parietal lobe and left occipital lobe. Small amount of hemorrhage in the left posterior frontal infarct unchanged from prior CT and MRI. Electronically Signed   By: Franchot Gallo M.D.   On: 01/24/2019 16:18   CT Head Wo Contrast  Result Date: 01/19/2019 CLINICAL DATA:  Stroke, follow-up. EXAM: CT HEAD WITHOUT CONTRAST TECHNIQUE: Contiguous axial images were obtained from the base of the skull through the vertex without intravenous contrast. COMPARISON:  CT angiogram head 01/18/2019, non-contrast CT head 01/18/2019, MRA head 11/25/2014, brain MRI 11/23/2014. FINDINGS: Brain: Loss of gray-white differentiation within portions of the lateral left frontal lobe (series 3, images 25 and 26) (series 6, images 50-54). Findings are consistent with acute infarct. No evidence of acute intracranial hemorrhage.  No intracranial mass. No midline shift or extra-axial fluid collection. Chronic lacunar infarct within the right caudate nucleus. Additional chronic infarcts within the bilateral cerebellar hemispheres, right temporal lobe, bilateral occipital lobes and right parietal lobe. Several of these remote infarcts were better appreciated on prior brain MRI 11/23/2014. Mild generalized parenchymal atrophy.  Vascular: No hyperdense vessel.  Atherosclerotic calcifications. Skull: Normal. Negative for fracture or focal lesion. Sinuses/Orbits: Right frontal scalp/periorbital soft tissue hematoma. No significant paranasal sinus disease or mastoid effusion at the imaged levels. Partially visualized life-support tubes. IMPRESSION: Interval demarcation of an acute cortically based infarct within the lateral left frontal lobe as described. No acute intracranial hemorrhage or significant mass effect. Electronically Signed   By: Kellie Simmering DO   On: 01/19/2019 12:50   CT Code Stroke CTA Neck W/WO contrast  Result Date: 01/18/2019 CLINICAL DATA:  Acute left MCA occlusion syndrome. EXAM: CT ANGIOGRAPHY HEAD AND NECK TECHNIQUE: Multidetector CT imaging of the head and neck was performed using the standard protocol during bolus administration of intravenous contrast. Multiplanar CT image reconstructions and MIPs were obtained to evaluate the vascular anatomy. Carotid stenosis measurements (when applicable) are obtained utilizing NASCET criteria, using the distal internal carotid diameter as the denominator. CONTRAST:  117m OMNIPAQUE IOHEXOL 350 MG/ML SOLN COMPARISON:  Head CT earlier same day FINDINGS: CTA NECK FINDINGS Aortic arch: Aortic atherosclerosis. No aneurysm or dissection. Branching pattern is normal without origin stenosis. Right carotid system: Common carotid artery is widely patent to the bifurcation region. Soft and calcified plaque at the carotid bifurcation and ICA bulb but no stenosis. Cervical ICA is patent to the skull base. Left carotid system: Common carotid artery patent to the bifurcation. Calcified and soft plaque at the carotid bifurcation and ICA bulb. No stenosis. Cervical ICA widely patent. Vertebral arteries: Dominant right vertebral artery is widely patent at the origin and through the cervical region to the foramen magnum. Left vertebral artery is occluded at the origin and reconstitutes in the  distal cervical region from collaterals. Skeleton: Mild midcervical spondylosis. Other neck: No mass or lymphadenopathy. Upper chest: Emphysema. No focal or active process otherwise. CTA HEAD FINDINGS Anterior circulation: Both internal carotid arteries are patent through the siphon regions. No siphon stenosis. Anterior and middle cerebral vessels are patent without flow limiting proximal stenosis, aneurysm or vascular malformation. Both anterior cerebral arteries receive there supply from the left carotid circulation. I do not see any large or medium vessel occlusion, with particular attention to the left MCA branches. I think there may be a few M3 to M4 branches on the left that lose opacification distally and there could be a few small left MCA territory emboli. Posterior circulation: Both vertebral arteries show flow at the foramen magnum with the right being dominant. Bilateral PICA flow demonstrated. No basilar stenosis. Superior cerebellar and posterior cerebral arteries show flow. Distal PCA branches show atherosclerotic irregularity. Venous sinuses: Patent and normal. Anatomic variants: None significant otherwise. Review of the MIP images confirms the above findings IMPRESSION: Atherosclerotic disease at both carotid bifurcations but no stenosis. Prominent soft plaque at the left ICA bulb. 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 Dominant right vertebral artery widely patent. No intracranial large or medium vessel occlusion or correctable proximal stenosis, with specific attention to the left MCA branches. I do think there are a few distal MCA branches that lose opacification and there could be a few small emboli in the left MCA territory. Vessels  marked with arrows. Distal vessel atherosclerotic irregularity, particularly evident in the PCA branches. Aortic Atherosclerosis (ICD10-I70.0) and Emphysema (ICD10-J43.9). Electronically Signed    By: Nelson Chimes M.D.   On: 01/18/2019 19:59   MR BRAIN WO CONTRAST  Result Date: 01/20/2019 CLINICAL DATA:  Stroke follow-up. Status post tPA administration. EXAM: MRI HEAD WITHOUT CONTRAST TECHNIQUE: Multiplanar, multiecho pulse sequences of the brain and surrounding structures were obtained without intravenous contrast. COMPARISON:  11/23/2014 FINDINGS: Brain: There is multifocal cortical ischemia within the left MCA territory. The largest area is in the left frontal lobe. There is ischemia throughout the left postcentral gyrus. There is no contralateral ischemia. Early confluent hyperintense T2-weighted signal of the periventricular and deep white matter, most commonly due to chronic ischemic microangiopathy. There are old right occipital and cerebellar infarcts. No hydrocephalus or age advanced volume loss. Blood-sensitive sequences show no chronic microhemorrhage or superficial siderosis. No intracranial hemorrhage. Vascular: Normal flow voids. Skull and upper cervical spine: Normal marrow signal. Sinuses/Orbits: Negative. Other: None. IMPRESSION: 1. Multifocal acute ischemia within the left MCA territory, including the left postcentral gyrus. No hemorrhage or mass effect. 2. 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 3. Chronic ischemic microangiopathy. Electronically Signed   By: Ulyses Jarred M.D.   On: 01/20/2019 02:22   DG CHEST PORT 1 VIEW  Result Date: 02/09/2019 CLINICAL DATA:  Chronic lung disease, leukocytosis EXAM: PORTABLE CHEST 1 VIEW COMPARISON:  Chest radiograph 02/04/2019, 01/29/2019 FINDINGS: Stable cardiomediastinal contours. Tracheostomy in place. There is improved aeration at the right lung base. Mildly increased left basilar consolidation. Probable small left pleural effusion. No pneumothorax. IMPRESSION: 1. Improved aeration at the right lung base. 2. Mildly increased left basilar consolidation which may represent atelectasis. Probable small left pleural effusion. Electronically  Signed   By: Audie Pinto M.D.   On: 02/09/2019 12:00   DG CHEST PORT 1 VIEW  Result Date: 02/04/2019 CLINICAL DATA:  69 year old male with chronic lung disease, leukocytosis. EXAM: PORTABLE CHEST 1 VIEW COMPARISON:  Portable chest 01/29/2019 and earlier. FINDINGS: Portable AP semi upright view at 0526 hours. Stable tracheostomy. Feeding tube has been removed. Stable lung volumes and ventilation with veiling bibasilar and dense retrocardiac opacity. No superimposed pneumothorax and pulmonary vascularity in the upper lobes seems to remain normal. IMPRESSION: 1. Feeding tube removed.  Stable tracheostomy. 2. Stable ventilation since 01/29/2019 with suspected bilateral pleural effusions and lower lobe collapse. Electronically Signed   By: Genevie Ann M.D.   On: 02/04/2019 07:23   DG CHEST PORT 1 VIEW  Result Date: 01/29/2019 CLINICAL DATA:  Tracheostomy in place EXAM: PORTABLE CHEST 1 VIEW COMPARISON:  01/24/2019 FINDINGS: Interval extubation and placement of tracheostomy device. Enteric tube passes into the stomach with tip out of field of view. Central line has been removed. No pneumothorax. Similar right pleural effusion and adjacent atelectasis. Increased retrocardiac density, probably atelectasis. Stable cardiomediastinal contours. IMPRESSION: Lines and tubes as above. Similar right pleural effusion and adjacent atelectasis. Increased left basilar density, probably atelectasis. Electronically Signed   By: Macy Mis M.D.   On: 01/29/2019 14:11   DG CHEST PORT 1 VIEW  Result Date: 01/24/2019 CLINICAL DATA:  Aspiration. EXAM: PORTABLE CHEST 1 VIEW COMPARISON:  January 21, 2019. FINDINGS: Stable cardiomediastinal silhouette. Endotracheal and nasogastric tubes are in good position. Left internal jugular catheter is unchanged. No pneumothorax is noted. Increased right pleural effusion is noted with associated right basilar atelectasis or infiltrate. Stable left basilar atelectasis is noted. Bony  thorax is unremarkable. IMPRESSION: Stable support  apparatus. Increased right pleural effusion is noted with associated right basilar atelectasis or infiltrate. Stable left basilar atelectasis is noted. No pneumothorax is noted. Stable left internal jugular catheter. Electronically Signed   By: Marijo Conception M.D.   On: 01/24/2019 09:19   Portable Chest x-ray  Result Date: 01/21/2019 CLINICAL DATA:  69 year old male with a history of endotracheal tube placement EXAM: PORTABLE CHEST 1 VIEW COMPARISON:  01/20/2019 FINDINGS: Cardiomediastinal silhouette unchanged. Fullness in the central vasculature. Mild interlobular septal thickening. Endotracheal tube terminates 4.9 cm above the carina. Gastric tube terminates with the side port above the GE junction. Unchanged left IJ central venous catheter with the tip appearing to terminate superior vena cava. No pneumothorax. Hazy opacities bilateral lower lungs, with blunting of the right costophrenic angle. IMPRESSION: Endotracheal tube terminates 4.9 cm over the carina, suitably position. Gastric tube terminates with the side port above the GE junction. Unchanged left IJ central venous catheter. Increased opacities at the lung bases with evidence of mild edema, atelectasis and/or consolidation with small right pleural effusion. Electronically Signed   By: Corrie Mckusick D.O.   On: 01/21/2019 10:19   DG Chest Port 1 View  Result Date: 01/20/2019 CLINICAL DATA:  69 year old male intubated. EXAM: PORTABLE CHEST 1 VIEW COMPARISON:  Portable chest radiographs yesterday and earlier. FINDINGS: Portable AP semi upright view at 0448 hours. Endotracheal tube tip at the level the clavicles. Stable left IJ central line. Enteric tube courses to the abdomen with side hole at the level of the proximal stomach. Increasing retrocardiac opacity on the left since yesterday morning. Stable ventilation elsewhere with crowding of what are probably chronic increased interstitial  markings. No pneumothorax. No definite effusion. Stable cardiac size and mediastinal contours. IMPRESSION: 1. Stable lines and tubes. 2. Increasing left lower lobe collapse or consolidation since yesterday. 3. Stable ventilation elsewhere. Electronically Signed   By: Genevie Ann M.D.   On: 01/20/2019 08:05   DG CHEST PORT 1 VIEW  Result Date: 01/19/2019 CLINICAL DATA:  Central line placement. EXAM: PORTABLE CHEST 1 VIEW COMPARISON:  01/19/2019 at 11:07 a.m. FINDINGS: Left internal jugular central venous line has its tip in the lower superior vena cava. No pneumothorax.  Lungs remain clear. Endotracheal and oral/nasogastric tubes are stable and well positioned. IMPRESSION: New left internal jugular central venous line tip projects in the lower superior vena cava. No pneumothorax. Electronically Signed   By: Lajean Manes M.D.   On: 01/19/2019 14:11   Portable Chest x-ray  Result Date: 01/19/2019 CLINICAL DATA:  69 year old male intubated, OG tube placement. EXAM: PORTABLE CHEST 1 VIEW COMPARISON:  Portable chest yesterday and earlier. FINDINGS: Portable AP semi upright view at 1111 hours. Endotracheal tube tip at the level the clavicles. Enteric tube courses to the abdomen, tip not included. Lower lung volumes. Stable cardiac size and mediastinal contours. Mild chronic pulmonary interstitial markings appear stable from a 2018 portable chest. No superimposed pneumothorax, pleural effusion or consolidation. No acute osseous abnormality identified. Calcified aortic atherosclerosis. IMPRESSION: 1. ETT tip in good position at the level the clavicles. Enteric tube courses to the abdomen, tip not included. 2. Lower lung volumes but otherwise no acute cardiopulmonary abnormality. Electronically Signed   By: Genevie Ann M.D.   On: 01/19/2019 11:28   DG Chest Portable 1 View  Result Date: 01/18/2019 CLINICAL DATA:  Encephalopathy EXAM: PORTABLE CHEST 1 VIEW COMPARISON:  Chest radiograph 07/17/2016 FINDINGS: There is  moderate cardiomegaly. There are faint airspace opacities in the right lung base.  No pleural effusion or pneumothorax. IMPRESSION: Right basilar opacities may indicate aspiration or pneumonia. Electronically Signed   By: Ulyses Jarred M.D.   On: 01/18/2019 21:36   DG Abd Portable 1V  Result Date: 01/29/2019 CLINICAL DATA:  Feeding tube placement EXAM: PORTABLE ABDOMEN - 1 VIEW COMPARISON:  None. FINDINGS: New enteric tube is within the stomach. Visualized bowel gas pattern is unremarkable. IMPRESSION: Enteric tube within the stomach. Electronically Signed   By: Macy Mis M.D.   On: 01/29/2019 14:21   DG Abd Portable 1V  Result Date: 01/21/2019 CLINICAL DATA:  Reason for exam: New OG tube placement EXAM: PORTABLE ABDOMEN - 1 VIEW COMPARISON:  Radiograph 01/21/2012 at 939 hours FINDINGS: Advancement of NG tube such that the side port is and now 6 cm below the GE junction. Tip in the gastric body. IMPRESSION: Advancement of NG tube as above. Electronically Signed   By: Suzy Bouchard M.D.   On: 01/21/2019 11:24   DG Abd Portable 1V  Result Date: 01/21/2019 CLINICAL DATA:  69 year old male with a history of gastric tube placement EXAM: PORTABLE ABDOMEN - 1 VIEW COMPARISON:  01/20/2019, 01/21/2011 FINDINGS: Limited upper abdominal plain film demonstrates gastric tube with the side-port terminating above the GE junction. Tip of the gastric tube terminates at the epigastric region. Gas in stomach and colon. IMPRESSION: Gastric tube terminates with the side port above the GE junction, with the tip in the epigastric region. Electronically Signed   By: Corrie Mckusick D.O.   On: 01/21/2019 10:26   VAS [place] UPPER EXTREMITY ARTERIAL DUPLEX  Result Date: 01/29/2019 UPPER EXTREMITY DUPLEX STUDY Indications: Patient complains of cold hand.  Other Factors: CVA, subtherapeutic INR, Atrial fibrillation. Limitations: Patient ventilation, arm positioning, and constant arm movement. Comparison Study: No prior  study on file for comparison Performing Technologist: Sharion Dove RVS  Examination Guidelines: A complete evaluation includes B-mode imaging, spectral Doppler, color Doppler, and power Doppler as needed of all accessible portions of each vessel. Bilateral testing is considered an integral part of a complete examination. Limited examinations for reoccurring indications may be performed as noted.  Left Doppler Findings: +-----------+----------+-------------------+------+-------------------+ Site       PSV (cm/s)Waveform           PlaqueComments            +-----------+----------+-------------------+------+-------------------+ Subclavian           triphasic                patent              +-----------+----------+-------------------+------+-------------------+ Axillary                                      thrombus            +-----------+----------+-------------------+------+-------------------+ Brachial                                      thrombus            +-----------+----------+-------------------+------+-------------------+ Radial     22        dampened monophasic                          +-----------+----------+-------------------+------+-------------------+ Ulnar  could not visualize +-----------+----------+-------------------+------+-------------------+ Palmar Arch                                   could not visualize +-----------+----------+-------------------+------+-------------------+  Summary:  Left: There appears to be arterial thrombus noted in the left       axillary and brachial arteries. There are dampened waveforms       noted in the proximal radial artery. Unable to visualize ulnar       artery, mid to distal radial artery, or palmar arch secondary       to patient's positioning and constant movement of arm.       Incidentally, there is DVT noted in the left brachial and       axillary veins and acute SVT noted  in the left cephalic and       basilic veins. *See table(s) above for measurements and observations. Electronically signed by Harold Barban MD on 01/29/2019 at 8:23:59 AM.    Final    ECHOCARDIOGRAM COMPLETE  Result Date: 01/19/2019   ECHOCARDIOGRAM REPORT   Patient Name:   KHADIR ROAM Date of Exam: 01/19/2019 Medical Rec #:  [id number]         Height:       68.0 in Accession #:    [id number]        Weight:       176.4 lb Date of Birth:  [birth year]         BSA:          1.94 m Patient Age:    69 years          BP:           72/65 mmHg Patient Gender: M                 HR:           72 bpm. Exam Location:  Inpatient Procedure: 2D Echo Indications:    Stroke 434.91/I163.9  History:        Patient has prior history of Echocardiogram examinations, most                 recent 11/25/2014. COPD, Arrythmias:Atrial Fibrillation; Risk                 Factors:Hypertension.  Sonographer:    Clayton Lefort RDCS (AE) Referring Phys: [phone] MCNEILL P [facility]  Sonographer Comments: Technically challenging study due to limited acoustic windows, Technically difficult study due to poor echo windows, suboptimal parasternal window, suboptimal apical window, suboptimal subcostal window and echo performed with patient supine and on artificial respirator. Image acquisition challenging due to chest wall deformity. All images taken subcostally. No measurements made in 4-2-3 chambers views due to images being significantly off-axis. IMPRESSIONS  1. Left ventricular ejection fraction, by visual estimation, is 55 to 60%. The left ventricle has normal function. There is moderately increased left ventricular hypertrophy.  2. The left ventricle demonstrates regional wall motion abnormalities.  3. Global right ventricle has normal systolic function.The right ventricular size is normal. No increase in right ventricular wall thickness.  4. Left atrial size was normal.  5. Right atrial size was mildly dilated.  6. The mitral valve is normal in  structure. Mild to moderate mitral valve regurgitation.  7. The tricuspid valve is normal in structure. Tricuspid valve regurgitation is trivial.  8. The aortic valve is normal in structure. Aortic valve regurgitation  is not visualized.  9. The pulmonic valve was normal in structure. Pulmonic valve regurgitation is not visualized. 10. The atrial septum is grossly normal. FINDINGS  Left Ventricle: Left ventricular ejection fraction, by visual estimation, is 55 to 60%. The left ventricle has normal function. The left ventricle demonstrates regional wall motion abnormalities. There is moderately increased left ventricular hypertrophy. Right Ventricle: The right ventricular size is normal. No increase in right ventricular wall thickness. Global RV systolic function is has normal systolic function. Left Atrium: Left atrial size was normal in size. Right Atrium: Right atrial size was mildly dilated Pericardium: There is no evidence of pericardial effusion. Mitral Valve: The mitral valve is normal in structure. Mild to moderate mitral valve regurgitation. Tricuspid Valve: The tricuspid valve is normal in structure. Tricuspid valve regurgitation is trivial. Aortic Valve: The aortic valve is normal in structure. Aortic valve regurgitation is not visualized. Pulmonic Valve: The pulmonic valve was normal in structure. Pulmonic valve regurgitation is not visualized. Pulmonic regurgitation is not visualized. Aorta: The aortic root and ascending aorta are structurally normal, with no evidence of dilitation. IAS/Shunts: The atrial septum is grossly normal.  LEFT VENTRICLE PLAX 2D LVIDd:         4.10 cm LVIDs:         3.40 cm LV PW:         1.50 cm LV IVS:        1.60 cm LVOT diam:     2.00 cm LV SV:         27 ml LV SV Index:   13.57 LVOT Area:     3.14 cm  IVC IVC diam: 2.70 cm LEFT ATRIUM         Index LA diam:    3.50 cm 1.81 cm/m   AORTA Ao Root diam: 3.60 cm TRICUSPID VALVE TR Peak grad:   11.0 mmHg TR Vmax:        166.00  cm/s  SHUNTS Systemic Diam: 2.00 cm  Mertie Moores MD Electronically signed by Mertie Moores MD Signature Date/Time: 01/19/2019/4:52:07 PM    Final    CT HEAD CODE STROKE WO CONTRAST  Result Date: 01/18/2019 CLINICAL DATA:  Code stroke.  Right facial droop EXAM: CT HEAD WITHOUT CONTRAST TECHNIQUE: Contiguous axial images were obtained from the base of the skull through the vertex without intravenous contrast. COMPARISON:  MRI 11/23/2014 FINDINGS: Brain: Old right lateral cerebellar infarction. Old right occipital infarction. Chronic small-vessel ischemic changes of the cerebral hemispheric white matter. No sign of acute infarction, mass lesion, hemorrhage, hydrocephalus or extra-axial collection. Vascular: There is atherosclerotic calcification of the major vessels at the base of the brain. Skull: Negative Sinuses/Orbits: Clear Other: Right frontal scalp hematoma. ASPECTS [facility] Stroke Program Early CT Score) - Ganglionic level infarction (caudate, lentiform nuclei, internal capsule, insula, M1-M3 cortex): 7 - Supraganglionic infarction (M4-M6 cortex): 3 Total score (0-10 with 10 being normal): 10 IMPRESSION: 1. No acute finding by CT. Chronic small-vessel ischemic changes of the white matter. Old right cerebellar and right occipital infarctions. 2. ASPECTS is 10 3. These results were communicated to Dr. Leonel Ramsay at 7:33 pmon 12/10/2020by text page via the [facility] messaging system. Electronically Signed   By: Nelson Chimes M.D.   On: 01/18/2019 19:34    ASSESSMENT AND PLAN: 1. Thrombocytosis 2. Iron deficiency anemia 3. Acute stroke with distal branch occlusions of the left MCA 4. Acute respiratory failure secondary to CVA, status post tracheostomy 5. Atrial fibrillation with RVR  -Platelet count continues  to decline is down to 522,000 today.  Remains on hydroxyurea 500 mg daily.  He will continue this dose.  Recommend continued monitoring. JAK2 was negative.  Calreticulin pending. Ideally, we would  consider a bone marrow biopsy but given his deconditioned state, we will not put him through an invasive procedure at this point. -He remains on anticoagulation with Eliquis.    LOS: 25 days   Mikey Bussing, DNP, AGPCNP-BC, AOCNP 02/12/19    ADDENDUM: I agree with the above assessment by Erasmo Downer.  Hydrea is working very nicely.  The JAK2 assay was negative.  I would like to think that the Calreticulin will also be negative.  It is possible that the thrombocytosis is from iron deficiency.  Now that he got iron, we can see the platelet count go down.  We will continue the Hydrea at the same dose.  Possibly, we might want to think about dropping the Hydrea and seeing how his platelet count does.  We will continue to follow along.  Neurologically, it is hard to say what type of prognosis or improvement he will make.  Lattie Haw, MD  Philippians 3:13-14

## 2019-02-12 NOTE — Progress Notes (Signed)
PCCM Progress Note   Called to bedside in reference to patient pulling out his trach Shiley 6 cuffed, in a posey and 4 point restraints.  He has had ongoing intermittent agitation in which his Seroquel was increased to 37.5 mg BID today- increased dose not given thus far.  He was trached on 12/21 by Dr. Tamala Julian, off MV since 12/27 doing well, has been on 28% trach collar.  Staff report minimal secretions, no increased WOB.    Per SLP notes, have been recommending downsizing and changing to cuffless.   RT reports she was unable to get cuffed back in mainly due to patient's ongoing uncooperativeness and ongoing profanity at staff and stoma appears to be closing.  PCCM called to see if patient needs trach replaced.   Blood pressure (!) 143/50, pulse 76, temperature 97.8 F (36.6 C), temperature source Axillary, resp. rate (!) 23, height 5' 8.5" (1.74 m), weight 66.3 kg, SpO2 100 %.  On exam he is in no distress, intermittently agitated in posey and 4 point restraints.  He phonates well, able to understand him and his profanity.  He does follow commands.  Stoma site appears to be small, doubt a 6 shiley would go back in smoothly and lungs clear, no distress.  He has been on room air with sats > 98%.    P:  No apparent need to replace trach at this time, will closely monitor  Have asked that a 4 cuffless be kept at bedside with tracheostomy kit for precaution and RT would bring bronch to bedside if he develops trouble.      Kennieth Rad, MSN, AGACNP-BC [facility] 02/12/2019, 8:19 PM

## 2019-02-12 NOTE — Progress Notes (Signed)
NAME:  [name], MRN:  [id number], DOB:  [birth year], LOS: 85 ADMISSION DATE:  01/18/2019, CONSULTATION DATE:  01/18/19 REFERRING MD:  Leonel Ramsay, CHIEF COMPLAINT:  Unable to elicit   Brief History   69 yo male smoker presented with HTN emergency and acute Lt MCA CVA s/p thrombolytic therapy, and A fib with RVR.  Required intubation for airway protection.  Had difficulty weaning from vent and required tracheostomy.  Significant Hospital Events   12/10 tPA administered 12/11 intubated 12/21 Tracheostomy 12/22 PEG placed  Significant Diagnostic Tests:  CT head 12/10 >> lose of opacification distal MCA Echo 12/11 >> EF 55 to 60%, mod LVH, mild/mod MR MRI brain 12/12 >> multifocal acute ischemia in Lt MCA territory, old Rt occipital and cerebellar infarcts, chronic ischemic microangiopathy.  Micro Data:  SARS CoV2 12/10 >> negative Sputum 12/16 >> Streptococcus pneumoniae, Enterobacter cloacae Urine 1/02 >> Enterobacter cloacae  Antimicrobials:  Cefepime 12/18 >> 12/25 Rocephin 1/02 >>  Interim history/subjective:  Mumbles few sounds when trach temporarily occluded.  Objective   Blood pressure (!) 151/71, pulse 72, temperature 97.6 F (36.4 C), temperature source Axillary, resp. rate 14, height 5' 8.5" (1.74 m), weight 66.3 kg, SpO2 100 %.    FiO2 (%):  [28 %] 28 %   Intake/Output Summary (Last 24 hours) at 02/12/2019 1412 Last data filed at 02/12/2019 1200 Gross per 24 hour  Intake 948 ml  Output 1850 ml  Net -902 ml   Filed Weights   02/09/19 0500 02/11/19 0500 02/12/19 0500  Weight: 71.2 kg 71.3 kg 66.3 kg   Physical Exam:  General - thin Eyes - pupils reactive ENT - trach site clean Cardiac - regular rate/rhythm, 2/6 SM Chest - equal breath sounds b/l, no wheezing or rales Abdomen - soft, non tender, + bowel sounds, G tube in place Extremities - no cyanosis, clubbing, or edema Skin - no rashes Neuro - not following commands   Resolved Issues    Pneumonia from Streptococcus pneumoniae and Enterobacter cloacae, HTN emergency  Assessment & Plan:   Compromised airway in setting of CVA. Failure to wean from ventilator s/p tracheostomy. - continue trach care - goal SpO2 > 92% - f/u CXR as needed if respiratory symptoms worsen - f/u with speech therapy - will likely be able to downsize trach later this week  Lt MCA embolic CVA. A fib, HTN, chronic diastolic CHF, HLD. - per primary team  Enterobacter UTI. - day 3 of rocephin  Iron deficiency anemia, thrombocytosis. - hematology consulted  Best practice:  DVT prophylaxis: eliquis SUP: not indicated Nutrition: tube feeds Code status: full code Disposition: progressive care  Labs:   CMP Latest Ref Rng & Units 02/09/2019 02/08/2019 02/07/2019  Glucose 70 - 99 mg/dL 54(L) 121(H) 92  BUN 8 - 23 mg/dL 19 20 20   Creatinine 0.61 - 1.24 mg/dL 0.75 0.72 0.78  Sodium 135 - 145 mmol/L 138 136 135  Potassium 3.5 - 5.1 mmol/L 3.9 4.0 4.0  Chloride 98 - 111 mmol/L 101 102 99  CO2 22 - 32 mmol/L 28 24 25   Calcium 8.9 - 10.3 mg/dL 9.6 9.2 9.4  Total Protein 6.5 - 8.1 g/dL - - -  Total Bilirubin 0.3 - 1.2 mg/dL - - -  Alkaline Phos 38 - 126 U/L - - -  AST 15 - 41 U/L - - -  ALT 0 - 44 U/L - - -    CBC Latest Ref Rng & Units 02/12/2019 02/09/2019 02/08/2019  WBC  4.0 - 10.5 K/uL 15.0(H) 13.0(H) 11.0(H)  Hemoglobin 13.0 - 17.0 g/dL 13.3 14.1 13.5  Hematocrit 39.0 - 52.0 % 39.5 41.9 39.9  Platelets 150 - 400 K/uL 522(H) 776(H) 829(H)    CBG (last 3)  Recent Labs    02/12/19 0417 02/12/19 0726 02/12/19 1134  GLUCAP 87 100* 150*    ABG    Component Value Date/Time   PHART 7.357 01/20/2019 0555   PCO2ART 37.6 01/20/2019 0555   PO2ART 62.0 (L) 01/20/2019 0555   HCO3 21.1 01/20/2019 0555   TCO2 22 01/20/2019 0555   ACIDBASEDEF 4.0 (H) 01/20/2019 0555   O2SAT 91.0 01/20/2019 0555    Chesley Mires, MD [place] 02/12/2019, 2:27 PM

## 2019-02-12 NOTE — Progress Notes (Signed)
STROKE TEAM PROGRESS NOTE   INTERVAL HISTORY Patient sitting up in bed.  Is intermittently agitated.  He uses profanity and gets restless.  He did not sleep well last night despite getting Seroquel 25 mg.  OBJECTIVE Vitals:   02/12/19 0418 02/12/19 0500 02/12/19 0722 02/12/19 0755  BP:   (!) 141/88 (!) 155/138  Pulse:   90 85  Resp:   18 18  Temp: 97.6 F (36.4 C)  97.7 F (36.5 C) (!) 97 F (36.1 C)  TempSrc: Oral  Oral   SpO2:   98% 100%  Weight:  66.3 kg    Height:        CBC:  Recent Labs  Lab 02/09/19 0330 02/12/19 0223  WBC 13.0* 15.0*  HGB 14.1 13.3  HCT 41.9 39.5  MCV 101.7* 102.3*  PLT 776* 522*    Basic Metabolic Panel:  Recent Labs  Lab 02/08/19 0150 02/09/19 0330  NA 136 138  K 4.0 3.9  CL 102 101  CO2 24 28  GLUCOSE 121* 54*  BUN 20 19  CREATININE 0.72 0.75  CALCIUM 9.2 9.6    Lipid Panel:     Component Value Date/Time   CHOL 238 (H) 01/19/2019 0437   TRIG 64 01/19/2019 0437   HDL 43 01/19/2019 0437   CHOLHDL 5.5 01/19/2019 0437   VLDL 13 01/19/2019 0437   LDLCALC 182 (H) 01/19/2019 0437   HgbA1c:  Lab Results  Component Value Date   HGBA1C 5.1 01/19/2019   Urine Drug Screen:     Component Value Date/Time   LABOPIA NONE DETECTED 07/13/2016 1428   COCAINSCRNUR NONE DETECTED 07/13/2016 1428   LABBENZ POSITIVE (A) 07/13/2016 1428   AMPHETMU NONE DETECTED 07/13/2016 1428   THCU POSITIVE (A) 07/13/2016 1428   LABBARB NONE DETECTED 07/13/2016 1428    Alcohol Level     Component Value Date/Time   ETH <5 02/09/2015 0010    IMAGING  CT HEAD WO CONTRAST 01/24/2019 IMPRESSION: Involving acute/subacute infarcts in the left frontal parietal lobe and left occipital lobe. Small amount of hemorrhage in the left posterior frontal infarct unchanged from prior CT and MRI. Electronically Signed   By: Franchot Gallo M.D.   On: 01/24/2019 16:18   MRI Wo Contrast  01/20/2019 IMPRESSION: 1. Multifocal acute ischemia within the left MCA  territory, including the left postcentral gyrus. No hemorrhage or mass effect. 2. 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 3. Chronic ischemic microangiopathy.  CT Head Wo Contrast  01/19/2019 IMPRESSION: Interval demarcation of an acute cortically based infarct within the lateral left frontal lobe as described. No acute intracranial hemorrhage or significant mass effect.  CT Code Stroke CTA Head W/WO contrast CT Code Stroke CTA Neck W/WO contrast 01/18/2019 IMPRESSION:  Atherosclerotic disease at both carotid bifurcations but no stenosis.  Prominent soft plaque at the left ICA bulb.  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  Dominant right vertebral artery widely patent.  No intracranial large or medium vessel occlusion or correctable proximal stenosis, with specific attention to the left MCA branches.  I do think there are a few distal MCA branches that lose opacification and there could be a few small emboli in the left MCA territory. Vessels marked with arrows.  Distal vessel atherosclerotic irregularity, particularly evident in the PCA branches.  Aortic Atherosclerosis (ICD10-I70.0)  Emphysema (ICD10-J43.9).   CT HEAD CODE STROKE WO CONTRAST 01/18/2019  IMPRESSION: 1. No acute finding by CT. Chronic  small-vessel ischemic changes of the white matter. Old right cerebellar and right occipital infarctions.  2. ASPECTS is 10   Portable Chest 1 View 02/04/19 Stable lung volumes and ventilation with veiling bibasilar and dense retrocardiac opacity. No superimposed pneumothorax and pulmonary vascularity in the upper lobes seems to remain normal. IMPRESSION: 1. Feeding tube removed.  Stable tracheostomy. 2. Stable ventilation since 01/29/2019 with suspected bilateral pleural effusions and lower lobe collapse.  Portable Chest 1 View 02/09/2019 IMPRESSION: 1. Improved aeration at the right lung base. 2. Mildly  increased left basilar consolidation which may represent atelectasis. Probable small left pleural effusion.  Transthoracic Echocardiogram  01/19/2019 IMPRESSIONS  1. Left ventricular ejection fraction, by visual estimation, is 55 to 60%. The left ventricle has normal function. There is moderately increased left ventricular hypertrophy.  2. The left ventricle demonstrates regional wall motion abnormalities.  3. Global right ventricle has normal systolic function.The right ventricular size is normal. No increase in right ventricular wall thickness.  4. Left atrial size was normal.  5. Right atrial size was mildly dilated.  6. The mitral valve is normal in structure. Mild to moderate mitral valve regurgitation.  7. The tricuspid valve is normal in structure. Tricuspid valve regurgitation is trivial.  8. The aortic valve is normal in structure. Aortic valve regurgitation is not visualized.  9. The pulmonic valve was normal in structure. Pulmonic valve regurgitation is not visualized. 10. The atrial septum is grossly normal.  ECG atrial fibrillation - ventricular response 133 BPM (See cardiology reading for complete details)  PHYSICAL EXAM  Blood pressure (!) 155/138, pulse 85, temperature (!) 97 F (36.1 C), resp. rate 18, height 5' 8.5" (1.74 m), weight 66.3 kg, SpO2 100 %.  General - frail elderly Caucasian male, on trach collar, awake, looking around and restless  Ophthalmologic - fundi not visualized due to noncooperation.  Cardiovascular - irregularly irregular heart rate and rhythm.  Neuro - pt sitting up in bed, on trach collar, awake, eyes open, looking around, not following commands, not agitated. Eyes left gaze preference, able to cross midline, right gaze incomplete. Able to say "god damn it" out from trach. Blinking to visual threat to the left but not to the ritght. Right neglect. PERRL. Right facial droop. Tongue protrusion not cooperative. Left UE at least 4/5, purposeful  strong movement against gravity. RUE 2/5 on pain stimulation, more at tricep. BLEs withdraw to pain, at least 4/5 on the left and 3-/5 on the right. DTR 1+ and bilateral babinski. Sensation, coordination and gait not tested.   ASSESSMENT/PLAN [name] is a 69 y.o. male with history of tobacco use, previous strokes, COPD, AAA, ASPVD, HTN, HLD, pulmonary nodule, depression, atrial fibrillation on coumadin but recently missed doses presenting with transient aphasia and leaning to the right. The patient received IV t-PA Thursday 01/18/19 @ 2000.  Stroke - left MCA infarct s/p t-PA - embolic pattern, likely due to atrial fibrillation with subtherapeutic INR  Resultant global aphasia, left gaze, right hemiparesis, right neglect and agitation  Code Stroke CT Head - No acute finding by CT. Chronic small-vessel ischemic changes of the white matter. Old right cerebellar and right occipital infarctions. ASPECTS is 10   CT head - Interval demarcation of an acute cortically based infarct within the lateral left frontal lobe as described. No acute intracranial hemorrhage or significant mass effect.  MRI head -  Multifocal acute ischemia within the left MCA territory, including the left postcentral gyrus. No hemorrhage or mass effect.  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 Chronic ischemic microangiopathy.  CTA H&N - Prominent soft plaque at the left ICA bulb. 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   CT 12/16 stable, continued evolution of left MCA infarcts  2D Echo - EF 55 to 60%. No cardiac source of emboli identified.   [employer] Virus 2 - negative  LDL - 182  HgbA1c - 5.1  Hypercoagulable work up ordered by hematology - mostly negative with some pending, Homocysteine 19.8 due to tobacco abuse  VTE prophylaxis -Heparin subq  warfarin daily prior to admission, now on Eliquis  Therapy recommendations:  SNF  Disposition:  Pending 02/12/19 to downsize to cuffless trach  Afib with RVR  On amiodarone and Cardizem po  On metoprolol 100 bid   Not compliant with warfarin PTA, INR 1.1 on admission INR 1.2 on 02/02/2019  On heparin IV and coumadin  Switched to Eliquis 12/26  Respiratory failure with hypoxia, resolved  Intubated -> s/p trach 12/21 -> trach collar 12/27  Off vent 12/27  CCM will follow 2-3 times per week  Plan to downsize trach to size 4 cuffless on 1/4  B12 deficiency  Vitamin B12 = 161  B12 supplement  History of stroke  11/2014, bilateral blurry vision after left upper extremity bypass surgery.  Found to have left central vision loss.  MRI showed bilateral embolic infarcts.  INR 1.45.  Carotid Doppler negative.  TTE unremarkable.  LDL 82, A1c 5.4.  Continued on discharge with Coumadin and Lipitor 40  Hypotension, resolved Hx of hypertension  Home BP meds: Diltiazem, Lasix and metoprolol  Current BP meds clonidine 0.3 q 6h , metoprolol 25 bid  BP stable  Off neo  On Lasix 20 daily and metoprolol 100 twice daily now  Hyperlipidemia  Home Lipid lowering medication: Lipitor 40 mg daily  LDL 182, goal < 70  Current lipid lowering medication: Lipitor 80 mg daily   Continue statin at discharge  Agitation   Initially difficult extubation due to agitation - was on ativan versed, and Risperdal.   Eventually extubated  Now more restless and agitated for the last 2-3 days however today seemed better  EKG stat pending  Will initiate seroquel 25mg  bid if EKG no QT prolongation  Dysphagia  On bolus TF now  S/p PEG 12/22  Speech on board  Continue NPO with alternative feeds  VAP Fever and leukocytosis  T-max 101.7->...afebrile    Leucocytosis - 7.8->9.0->15.6->16.9->15.6->13.2->12.6-12.6-12.3->11->13.0 (afebrile)  CXR 02/04/19 - Stable ventilation since 01/29/2019 with suspected bilateral pleural effusions and lower lobe collapse.  UA  neg 01/18/19  Repeat UA - abnormal (nitrite positive with many bacteria but no WBCs) - culture - pending  Repeat CXR 01/09/2020 - Mildly increased left basilar consolidation which may represent atelectasis. Probable small left pleural effusion. Repeat CBC in AM.  Was on cefepime (started 01/26/19 x 7 days) for S.pneumoniae and E.cloacae in sputum ->now off all antibiotics  CCM on board  Tobacco abuse  Current smoker  Smoking cessation counseling will be provided  Pt is willing to quit  Likely the cause of mildly elevated homocysteine level  Thrombocytosis  Platelet 750->828->843->914-925->823->829->776  Hematology consulted  has large and giant PLTS on smear  Possibly reactive, doubt myeloproliferative d/c as normal PTA  Not a candidate for bone marrow bx  Hypercoagulable work up largely negative with some pending  Placed on empiric hydroxyurea, plan increase if PLTS remain stable  Labs:  calreticulin pending, Jak2 pending   Fe deficiency  Fe 41, Fol 4.7, Sat ratios low  On feraheme  Other Stroke Risk Factors  Advanced age  ETOH use, advised to drink no more than 1 alcoholic beverage per day.  Hx narcotic dependence  AAA  PAD s/p L axilla to brachial and L axilla to ulnar BPG  Other Active Problems  Hypokalemia 3.5->3.8->4.2->4.0->4.0->3.9  Oral thrush on nystatin   Palliative Care consult 12/25 -> brother wants aggressive care -> (Dr Leonie Man contacted [place] per family request; however,  [facility] would not agree to accept transfer)  Leukocytosis with +nitrite UA. Check Ucx. Started Rocephin for 3 days.  Hospital day # 25   I have personally obtained history,examined this patient, reviewed notes, independently viewed imaging studies, participated in medical decision making and plan of care.ROS completed by me personally and pertinent positives fully documented  I have made any additions or clarifications directly to the above note.  Plan increase Seroquel at  night to 37.5 mg and add valproic acid 250 mg every 8 hourly for agitation.  Patient will be difficult to place because of his agitation. Greater than 50% time during this 25-minute visit was spent on counseling and coordination of care and discussion with care team.  Antony Contras, MD Medical Director [place] Pager: [phone] 02/12/2019 4:34 PM

## 2019-02-12 NOTE — Progress Notes (Signed)
RT called to patients room due to patient pulling trach out. Two RTs attempted to reinsert trach with no success. CCM called to assess patient. [name] is still currently out of patient. Patient and vitals stable throughout. RT will continue to monitor.

## 2019-02-12 NOTE — Progress Notes (Signed)
Physical Therapy Treatment Patient Details Name: [name] MRN: [id number] DOB: Jun 17, 1950 Today's Date: 02/12/2019    History of Present Illness 53yM admitted 01/20/19 with hypertensive emergency in setting of acute L MCA stroke s/p tPA, AF/RVR.  Has been noncompliant with his warfarin for A. Fib. Intubated for respiratory failure. Trach 01/29/19 with PEG planned for 01/30/19. No sedation at time of PT eval.     PT Comments    Pt generally low to moderate arousal.  He can be directed through tasks with cues and directional assist, but doesn't follow direction.  Emphasis on transition to EOB, sitting balance and truncal control, sit to stand's and initiation of pre-gait.  Pt has been agitated today in 5-pt restraints for safety, so did not stay up OOB today.   Follow Up Recommendations  SNF;Supervision/Assistance - 24 hour;Other (comment)     Equipment Recommendations  Other (comment)    Recommendations for Other Services       Precautions / Restrictions Precautions Precautions: Fall Precaution Comments: multiple lines, on TC at 28%    Mobility  Bed Mobility Overal bed mobility: Needs Assistance Bed Mobility: Rolling;Sidelying to Sit;Sit to Supine Rolling: Mod assist;Max assist Sidelying to sit: Max assist   Sit to supine: Max assist;+2 for physical assistance   General bed mobility comments: cues for direction, full  assist of trunk to come up and forward via L elbow and for scoot to EOB  Transfers Overall transfer level: Needs assistance   Transfers: Sit to/from Stand Sit to Stand: Max assist         General transfer comment: face to face assist to stand with cues and assist to come forward and boost.  mod assist of 2 to remain standing.,  pt able to add equal weight into both LE's automatically.    Ambulation/Gait             General Gait Details: unable   Stairs             Wheelchair Mobility    Modified Rankin (Stroke Patients  Only) Modified Rankin (Stroke Patients Only) Modified Rankin: Severe disability     Balance Overall balance assessment: Needs assistance Sitting-balance support: Single extremity supported;Bilateral upper extremity supported;Feet supported Sitting balance-Leahy Scale: Poor Sitting balance - Comments: pt has fairly strong extensors and strong truncal flexors, but will not sit upright with head up, but stays "jack-knifed" forward if given the chance.     Standing balance-Leahy Scale: Poor Standing balance comment: face to face standing assist.  pt able to shift weight L to pick up R LE, but unable to trust weight on R LE to lift L LE, at least to cuing.                            Cognition Arousal/Alertness: Awake/alert Behavior During Therapy: Flat affect Overall Cognitive Status: Impaired/Different from baseline                   Orientation Level: Place;Time;Situation Current Attention Level: Focused   Following Commands: Follows one step commands inconsistently;Follows one step commands with increased time Safety/Judgement: Decreased awareness of safety;Decreased awareness of deficits Awareness: Intellectual Problem Solving: Slow processing;Decreased initiation;Difficulty sequencing;Requires verbal cues;Requires tactile cues        Exercises Other Exercises Other Exercises: Warm up ROM to LE's with attempt to get movement to command.    General Comments        Pertinent Vitals/Pain Pain  Assessment: Faces Faces Pain Scale: No hurt Pain Intervention(s): Monitored during session    Home Living                      Prior Function            PT Goals (current goals can now be found in the care plan section) Acute Rehab PT Goals Patient Stated Goal: none stated PT Goal Formulation: Patient unable to participate in goal setting Time For Goal Achievement: 02/20/19 Potential to Achieve Goals: Fair Progress towards PT goals: Progressing  toward goals    Frequency    Min 2X/week      PT Plan Current plan remains appropriate    Co-evaluation              AM-PAC PT "6 Clicks" Mobility   Outcome Measure  Help needed turning from your back to your side while in a flat bed without using bedrails?: Total Help needed moving from lying on your back to sitting on the side of a flat bed without using bedrails?: Total Help needed moving to and from a bed to a chair (including a wheelchair)?: Total Help needed standing up from a chair using your arms (e.g., wheelchair or bedside chair)?: Total Help needed to walk in hospital room?: Total Help needed climbing 3-5 steps with a railing? : Total 6 Click Score: 6    End of Session   Activity Tolerance: Patient tolerated treatment well Patient left: in bed;with call bell/phone within reach;with bed alarm set;with restraints reapplied;with SCD's reapplied Nurse Communication: Mobility status PT Visit Diagnosis: Hemiplegia and hemiparesis;Other abnormalities of gait and mobility (R26.89);Other symptoms and signs involving the nervous system (R29.898) Hemiplegia - Right/Left: Right Hemiplegia - dominant/non-dominant: Dominant Hemiplegia - caused by: Cerebral infarction     Time: [id number] PT Time Calculation (min) (ACUTE ONLY): 24 min  Charges:  $Therapeutic Activity: 8-22 mins $Neuromuscular Re-education: 8-22 mins                     02/12/2019  [name], PT Acute Rehabilitation Services [phone]  (pager) [phone]  (office)   [name] [name] 02/12/2019, 3:18 PM

## 2019-02-13 LAB — GLUCOSE, CAPILLARY
Glucose-Capillary: 121 mg/dL — ABNORMAL HIGH (ref 70–99)
Glucose-Capillary: 100 mg/dL — ABNORMAL HIGH (ref 70–99)
Glucose-Capillary: 74 mg/dL (ref 70–99)
Glucose-Capillary: 81 mg/dL (ref 70–99)
Glucose-Capillary: 130 mg/dL — ABNORMAL HIGH (ref 70–99)
Glucose-Capillary: 76 mg/dL (ref 70–99)
Glucose-Capillary: 122 mg/dL — ABNORMAL HIGH (ref 70–99)

## 2019-02-13 LAB — CALRETICULIN (CALR) MUTATION ANALYSIS

## 2019-02-13 LAB — URINE CULTURE: Culture: >=100000 — AB

## 2019-02-13 LAB — PROTHROMBIN GENE MUTATION

## 2019-02-13 MED ORDER — DEXTROSE 50 % IV SOLN
INTRAVENOUS | Status: AC
  Filled 2019-02-13: qty 50

## 2019-02-13 NOTE — Progress Notes (Signed)
Physical Therapy Treatment Patient Details Name: [name] MRN: [id number] DOB: 01-12-1951 Today's Date: 02/13/2019    History of Present Illness 89yM admitted 01/20/19 with hypertensive emergency in setting of acute L MCA stroke s/p tPA, AF/RVR.  Has been noncompliant with his warfarin for A. Fib. Intubated for respiratory failure. Trach 01/29/19 with PEG planned for 01/30/19. No sedation at time of PT eval.     PT Comments    By necessity, session emphasized rolling due to need for extensive peri-care.  Pt shook his head "NO" to sitting EOB and therapy asked that we stay in bed due to loose stools today.    Follow Up Recommendations  SNF;Supervision/Assistance - 24 hour;Other (comment)     Equipment Recommendations  Other (comment)    Recommendations for Other Services       Precautions / Restrictions Precautions Precautions: Fall Precaution Comments: multiple lines, on TC at 28%    Mobility  Bed Mobility Overal bed mobility: Needs Assistance Bed Mobility: Rolling Rolling: Mod assist;Max assist         General bed mobility comments: Extensive rolling for pericare.  Pt following tactile direction cues, but not verbal cues.  Pt also needed assist to hold sidelying either side.    Transfers Overall transfer level: Needs assistance               General transfer comment: pt declined to get up to EOB and pt stooling too much to get OOB today per nursing.  Ambulation/Gait             General Gait Details: unable   Stairs             Wheelchair Mobility    Modified Rankin (Stroke Patients Only) Modified Rankin (Stroke Patients Only) Modified Rankin: Severe disability     Balance                                            Cognition Arousal/Alertness: Awake/alert Behavior During Therapy: Flat affect Overall Cognitive Status: Impaired/Different from baseline                   Orientation Level:  Place;Time;Situation Current Attention Level: Focused   Following Commands: Follows one step commands inconsistently;Follows one step commands with increased time Safety/Judgement: Decreased awareness of safety;Decreased awareness of deficits Awareness: Intellectual Problem Solving: Slow processing;Decreased initiation;Difficulty sequencing;Requires verbal cues;Requires tactile cues General Comments: Pt not stating any words today.      Exercises Other Exercises Other Exercises: Warm up ROM to LE's with attempt to get movement to command.    General Comments General comments (skin integrity, edema, etc.): vss      Pertinent Vitals/Pain Pain Assessment: Faces Faces Pain Scale: No hurt    Home Living                      Prior Function            PT Goals (current goals can now be found in the care plan section) Acute Rehab PT Goals PT Goal Formulation: Patient unable to participate in goal setting Time For Goal Achievement: 02/20/19 Potential to Achieve Goals: Fair Progress towards PT goals: Progressing toward goals(slowed progress.)    Frequency    Min 2X/week      PT Plan Current plan remains appropriate    Co-evaluation  AM-PAC PT "6 Clicks" Mobility   Outcome Measure  Help needed turning from your back to your side while in a flat bed without using bedrails?: Total Help needed moving from lying on your back to sitting on the side of a flat bed without using bedrails?: Total Help needed moving to and from a bed to a chair (including a wheelchair)?: Total Help needed standing up from a chair using your arms (e.g., wheelchair or bedside chair)?: Total Help needed to walk in hospital room?: Total Help needed climbing 3-5 steps with a railing? : Total 6 Click Score: 6    End of Session   Activity Tolerance: Patient tolerated treatment well Patient left: in bed;with call bell/phone within reach;with bed alarm set;with restraints  reapplied;with SCD's reapplied Nurse Communication: Mobility status PT Visit Diagnosis: Hemiplegia and hemiparesis;Other abnormalities of gait and mobility (R26.89);Other symptoms and signs involving the nervous system (R29.898) Hemiplegia - Right/Left: Right Hemiplegia - dominant/non-dominant: Dominant Hemiplegia - caused by: Cerebral infarction     Time: 1300-1313 PT Time Calculation (min) (ACUTE ONLY): 13 min  Charges:  $Therapeutic Activity: 8-22 mins                     02/13/2019  [name] Carne., PT Acute Rehabilitation Services [phone]  (pager) [phone]  (office)   [name] 02/13/2019, 1:25 PM

## 2019-02-13 NOTE — Progress Notes (Signed)
NAME:  [name], MRN:  [id number], DOB:  16-Sep-1950, LOS: 60 ADMISSION DATE:  01/18/2019, CONSULTATION DATE:  01/18/19 REFERRING MD:  Leonel Ramsay, CHIEF COMPLAINT:  Unable to elicit   Brief History   69 yo male smoker presented with HTN emergency and acute Lt MCA CVA s/p thrombolytic therapy, and A fib with RVR.  Required intubation for airway protection.  Had difficulty weaning from vent and required tracheostomy.  Significant Hospital Events   12/10 tPA administered 12/11 intubated 12/21 Tracheostomy 12/22 PEG placed 01/04 patient pulled out trach >> left out  Significant Diagnostic Tests:  CT head 12/10 >> lose of opacification distal MCA Echo 12/11 >> EF 55 to 60%, mod LVH, mild/mod MR MRI brain 12/12 >> multifocal acute ischemia in Lt MCA territory, old Rt occipital and cerebellar infarcts, chronic ischemic microangiopathy.  Micro Data:  SARS CoV2 12/10 >> negative Sputum 12/16 >> Streptococcus pneumoniae, Enterobacter cloacae Urine 1/02 >> Enterobacter cloacae  Antimicrobials:  Cefepime 12/18 >> 12/25 Rocephin 1/02 >>  Interim history/subjective:  Trach pulled out last night.    Objective   Blood pressure 108/63, pulse 77, temperature 98.7 F (37.1 C), temperature source Oral, resp. rate (!) 23, height 5' 8.5" (1.74 m), weight 66.3 kg, SpO2 99 %.    FiO2 (%):  [28 %] 28 %   Intake/Output Summary (Last 24 hours) at 02/13/2019 1016 Last data filed at 02/12/2019 1713 Gross per 24 hour  Intake 474 ml  Output 350 ml  Net 124 ml   Filed Weights   02/09/19 0500 02/11/19 0500 02/12/19 0500  Weight: 71.2 kg 71.3 kg 66.3 kg   Physical Exam:  General - thin Eyes - pupils reactive ENT - no stridor Cardiac - regular rate/rhythm, 2/6 systolic murmur Chest - equal breath sounds b/l, no wheezing or rales Abdomen - soft, non tender, + bowel sounds, G tube in place Extremities - no cyanosis, clubbing, or edema Skin - no rashes Neuro - mumbles few  sounds   Resolved Issues  Pneumonia from Streptococcus pneumoniae and Enterobacter cloacae, HTN emergency, tracheostomy status, Enterobacter UTI  Assessment & Plan:   Compromised airway in setting of CVA. - he pulled his trach out 1/04 - has adequate cough, no stridor, maintaining oxygenation - stoma care until stoma closes  Lt MCA embolic CVA. A fib, HTN, chronic diastolic CHF, HLD. - per primary team  Iron deficiency anemia, thrombocytosis. - hematology consulted  Best practice:  DVT prophylaxis: eliquis SUP: not indicated Nutrition: tube feeds Code status: full code Disposition: progressive care  PCCM will sign off.  Please call if additional help needed while he is in hospital.  Labs:   CMP Latest Ref Rng & Units 02/09/2019 02/08/2019 02/07/2019  Glucose 70 - 99 mg/dL 54(L) 121(H) 92  BUN 8 - 23 mg/dL 19 20 20   Creatinine 0.61 - 1.24 mg/dL 0.75 0.72 0.78  Sodium 135 - 145 mmol/L 138 136 135  Potassium 3.5 - 5.1 mmol/L 3.9 4.0 4.0  Chloride 98 - 111 mmol/L 101 102 99  CO2 22 - 32 mmol/L 28 24 25   Calcium 8.9 - 10.3 mg/dL 9.6 9.2 9.4  Total Protein 6.5 - 8.1 g/dL - - -  Total Bilirubin 0.3 - 1.2 mg/dL - - -  Alkaline Phos 38 - 126 U/L - - -  AST 15 - 41 U/L - - -  ALT 0 - 44 U/L - - -    CBC Latest Ref Rng & Units 02/12/2019 02/09/2019 02/08/2019  WBC  4.0 - 10.5 K/uL 15.0(H) 13.0(H) 11.0(H)  Hemoglobin 13.0 - 17.0 g/dL 13.3 14.1 13.5  Hematocrit 39.0 - 52.0 % 39.5 41.9 39.9  Platelets 150 - 400 K/uL 522(H) 776(H) 829(H)    CBG (last 3)  Recent Labs    02/13/19 0403 02/13/19 0602 02/13/19 0719  GLUCAP 100* 74 81    ABG    Component Value Date/Time   PHART 7.357 01/20/2019 0555   PCO2ART 37.6 01/20/2019 0555   PO2ART 62.0 (L) 01/20/2019 0555   HCO3 21.1 01/20/2019 0555   TCO2 22 01/20/2019 0555   ACIDBASEDEF 4.0 (H) 01/20/2019 0555   O2SAT 91.0 01/20/2019 [place], MD [place] 02/13/2019, 10:16 AM

## 2019-02-13 NOTE — Progress Notes (Signed)
STROKE TEAM PROGRESS NOTE   INTERVAL HISTORY Patient slept well last night.  He remains intermittently agitated requiring restraints.  He remains aphasic.  He pulled out tracheostomy tube early this morning but seems to be breathing well without it.  Critical care has decided to leave the tracheostomy out for now and observe him.  Appreciate hematology consult and help managing his thrombocytosis.  OBJECTIVE Vitals:   02/12/19 2331 02/13/19 0011 02/13/19 0400 02/13/19 0402  BP: 105/83  108/63   Pulse: (!) 120  77   Resp:   (!) 23   Temp:  98.2 F (36.8 C)  98.7 F (37.1 C)  TempSrc:  Oral  Oral  SpO2:   99%   Weight:      Height:        CBC:  Recent Labs  Lab 02/09/19 0330 02/12/19 0223  WBC 13.0* 15.0*  HGB 14.1 13.3  HCT 41.9 39.5  MCV 101.7* 102.3*  PLT 776* 522*    Basic Metabolic Panel:  Recent Labs  Lab 02/08/19 0150 02/09/19 0330  NA 136 138  K 4.0 3.9  CL 102 101  CO2 24 28  GLUCOSE 121* 54*  BUN 20 19  CREATININE 0.72 0.75  CALCIUM 9.2 9.6   IMAGING past 24h No results found.    PHYSICAL EXAM    General - frail elderly Caucasian male, on trach collar, awake, looking around and restless  Ophthalmologic - fundi not visualized due to noncooperation.  Cardiovascular - irregularly irregular heart rate and rhythm.  Neuro - pt sitting up in bed, on trach collar, awake, eyes open, looking around, not following commands, intermittently agitated. Eyes left gaze preference, able to cross midline, right gaze incomplete. Able to say "god damn it" out from trach. Blinking to visual threat to the left but not to the ritght. Right neglect. PERRL. Right facial droop. Tongue protrusion not cooperative. Left UE at least 4/5, purposeful strong movement against gravity. RUE 2/5 on pain stimulation, more at tricep. BLEs withdraw to pain, at least 4/5 on the left and 3-/5 on the right. DTR 1+ and bilateral babinski. Sensation, coordination and gait not  tested.   ASSESSMENT/PLAN [name] is a 69 y.o. male with history of tobacco use, previous strokes, COPD, AAA, ASPVD, HTN, HLD, pulmonary nodule, depression, atrial fibrillation on coumadin but recently missed doses presenting with transient aphasia and leaning to the right. The patient received IV t-PA Thursday 01/18/19 @ 2000.  Stroke - left MCA infarct s/p t-PA - embolic pattern, likely due to atrial fibrillation with subtherapeutic INR  Resultant global aphasia, left gaze, right hemiparesis, right neglect and agitation  Code Stroke CT Head - No acute finding by CT. Chronic small-vessel ischemic changes of the white matter. Old right cerebellar and right occipital infarctions. ASPECTS is 10   CT head - Interval demarcation of an acute cortically based infarct within the lateral left frontal lobe as described. No acute intracranial hemorrhage or significant mass effect.  MRI head -  Multifocal acute ischemia within the left MCA territory, including the left postcentral gyrus. No hemorrhage or mass effect. 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 Chronic ischemic microangiopathy.  CTA H&N - Prominent soft plaque at the left ICA bulb. 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   CT 12/16 stable, continued evolution of left MCA infarcts  2D Echo - EF 55 to 60%. No cardiac source of emboli identified.   Linus Orn  Corona Virus 2 - negative  LDL - 182  HgbA1c - 5.1  Hypercoagulable work up ordered by hematology - mostly negative thus far , Homocysteine 19.8 due to tobacco abuse  VTE prophylaxis -Heparin subq  warfarin daily prior to admission, now on Eliquis  Therapy recommendations: SNF  Disposition:  Pending   Afib with RVR  On amiodarone and Cardizem po  On metoprolol 100 bid   Not compliant with warfarin PTA, INR 1.1 on admission INR 1.2 on 02/02/2019  On heparin IV and coumadin  Switched to  Eliquis 12/26  Respiratory failure with hypoxia, resolved  Intubated -> s/p trach 12/21 -> trach collar 12/27  Off vent 12/27  CCM following 2-3 times per week  Planned to downsize trach to size 4 cuffless this week  Pt Pulled out trach 1/4, not in respiratory distress  CCM saw, will leave trach out  Stoma care until stoma closes  B12 deficiency  Vitamin B12 = 161  B12 supplement  History of stroke  11/2014, bilateral blurry vision after left upper extremity bypass surgery.  Found to have left central vision loss.  MRI showed bilateral embolic infarcts.  INR 1.45.  Carotid Doppler negative.  TTE unremarkable.  LDL 82, A1c 5.4.  Continued on discharge with Coumadin and Lipitor 40  Hypotension, resolved Hx of hypertension  Home BP meds: Diltiazem, Lasix and metoprolol  Current BP meds clonidine 0.3 q 6h , metoprolol 25 bid  BP stable  Off neo  On Lasix 20 daily and metoprolol 100 twice daily now  Hyperlipidemia  Home Lipid lowering medication: Lipitor 40 mg daily  LDL 182, goal < 70  Current lipid lowering medication: Lipitor 80 mg daily   Continue statin at discharge  Agitation   Initially difficult extubation due to agitation - was on ativan versed, and Risperdal.   Eventually extubated  Now more restless and agitated for the last 2-3 days however today seemed better  Last EKG QTc 482   On seroquel 37.5 mg bid   On valproic acid 250 q8h  Dysphagia  On bolus TF now  S/p PEG 12/22  Speech on board  Continue NPO with alternative feeds  VAP, treated Entrobacter UTI, treated  Fever (resolved) and leukocytosis  T-max 101.7->...afebrile    WBC 15.0 (afebrile)  CXR 02/04/19 - Stable ventilation since 01/29/2019 with suspected bilateral pleural effusions and lower lobe collapse.  UA neg 01/18/19  Repeat CXR 01/09/2020 - Mildly increased left basilar consolidation which may represent atelectasis. Probable small left pleural effusion.   Was  on cefepime (started 01/26/19 x 7 days) for S.pneumoniae and E.cloacae in sputum  Repeat UA - abnormal (nitrite positive with many bacteria but no WBCs) - culture - Entrobacter UTI treated w/ Rocephin for 3 days.   CCM on board  Tobacco abuse  Current smoker  Smoking cessation counseling will be provided  Pt is willing to quit  Likely the cause of mildly elevated homocysteine level  Thrombocytosis, possibly d/t Fe deficiency  Platelet as high as 925, now 522 on 1/4   Hematology consulted  has large and giant PLTS on smear  Possibly reactive, doubt myeloproliferative d/c as normal PTA  Not a candidate for bone marrow bx  Hypercoagulable work up largely negative with some negative thus far  Placed on empiric hydroxyurea, plan increase if PLTS remain stable  Labs:  calreticulin pending, Jak2 negative    Fe deficiency  Fe 41, Fol 4.7, Sat ratios low  On feraheme  Other Stroke  Risk Factors  Advanced age  ETOH use, advised to drink no more than 1 alcoholic beverage per day.  Hx narcotic dependence  AAA  PAD s/p L axilla to brachial and L axilla to ulnar BPG  Other Active Problems  Hypokalemia, resolved   Oral thrush on nystatin   Palliative Care consult 12/25 -> brother wants aggressive care -> (Dr Leonie Man contacted [facility] per family request; however,  [facility] would not agree to accept transfer)    Hospital day # 26   Appreciate hematology help and management of thrombocytosis.  We will consult medical hospitalist team to help with management of medical problems and transfer to their service over the next few days.  Continue valproic acid and Seroquel for sedation and agitation. Greater than 50% time during this 25-minute visit was spent on counseling and coordination of care and discussion with care team.    Antony Contras, MD Medical Director [facility] Pager: [phone] 02/13/2019 11:18 AM

## 2019-02-13 NOTE — Progress Notes (Signed)
Nutrition Follow-up  DOCUMENTATION CODES:   Severe malnutrition in context of chronic illness  INTERVENTION:  Continue bolus tube feeds using Osmolite 1.5 formula via PEG at goal volume of 474 ml (2 cartons/ARCS) given TID.   Provide 30 ml Prostat once daily.   Tube feeding regimen to provide 2233 kcal (100% of needs), 104 grams of protein, and 1081 ml of free water.   NUTRITION DIAGNOSIS:   Severe Malnutrition related to chronic illness(COPD) as evidenced by severe fat depletion, severe muscle depletion; ongoing  GOAL:   Patient will meet greater than or equal to 90% of their needs; met with TF  MONITOR:   TF tolerance, Weight trends, Skin, Labs, I & O's  REASON FOR ASSESSMENT:   Consult, Ventilator Enteral/tube feeding initiation and management  ASSESSMENT:   69 yo male admitted with hypertensive emergency in setting of acute L MCA stroke, required intubation for airway protection, pt choking on own secretions. PMH includes COPD, AF, CVA, PAD.  12/21 S/P tracheostomy  12/22 PEG placed  1/4 Trach pulled out and left out  Pt continues on NPO status and has been tolerating his bolus tube feeds well. RD to continue with current orders and monitor for tolerance. Labs and medications reviewed.   Diet Order:   Diet Order            Diet NPO time specified  Diet effective now              EDUCATION NEEDS:   Not appropriate for education at this time  Skin:  Skin Assessment: Skin Integrity Issues: Skin Integrity Issues:: Stage II Stage II: sacrum  Last BM:  1/2  Height:   Ht Readings from Last 1 Encounters:  01/28/19 5' 8.5" (1.74 m)    Weight:   Wt Readings from Last 1 Encounters:  02/12/19 66.3 kg    Ideal Body Weight:  70 kg  BMI:  Body mass index is 21.9 kg/m.  Estimated Nutritional Needs:   Kcal:  6578-4696  Protein:  100-120 grams  Fluid:  >2L    Corrin Parker, MS, RD, LDN Pager # [phone] After hours/ weekend pager #  [phone]

## 2019-02-14 LAB — CBC WITH DIFFERENTIAL/PLATELET
Abs Immature Granulocytes: 0.07 10*3/uL (ref 0.00–0.07)
Basophils Absolute: 0 10*3/uL (ref 0.0–0.1)
Basophils Relative: 0 %
Eosinophils Absolute: 0.1 10*3/uL (ref 0.0–0.5)
Eosinophils Relative: 0 %
HCT: 43.2 % (ref 39.0–52.0)
Hemoglobin: 14.4 g/dL (ref 13.0–17.0)
Immature Granulocytes: 1 %
Lymphocytes Relative: 20 %
Lymphs Abs: 2.6 10*3/uL (ref 0.7–4.0)
MCH: 34.3 pg — ABNORMAL HIGH (ref 26.0–34.0)
MCHC: 33.3 g/dL (ref 30.0–36.0)
MCV: 102.9 fL — ABNORMAL HIGH (ref 80.0–100.0)
Monocytes Absolute: 0.7 10*3/uL (ref 0.1–1.0)
Monocytes Relative: 5 %
Neutro Abs: 9.9 10*3/uL — ABNORMAL HIGH (ref 1.7–7.7)
Neutrophils Relative %: 74 %
Platelets: 412 10*3/uL — ABNORMAL HIGH (ref 150–400)
RBC: 4.2 MIL/uL — ABNORMAL LOW (ref 4.22–5.81)
RDW: 14.3 % (ref 11.5–15.5)
WBC: 13.3 10*3/uL — ABNORMAL HIGH (ref 4.0–10.5)
nRBC: 0 % (ref 0.0–0.2)

## 2019-02-14 LAB — GLUCOSE, CAPILLARY
Glucose-Capillary: 111 mg/dL — ABNORMAL HIGH (ref 70–99)
Glucose-Capillary: 115 mg/dL — ABNORMAL HIGH (ref 70–99)
Glucose-Capillary: 125 mg/dL — ABNORMAL HIGH (ref 70–99)
Glucose-Capillary: 135 mg/dL — ABNORMAL HIGH (ref 70–99)
Glucose-Capillary: 76 mg/dL (ref 70–99)
Glucose-Capillary: 84 mg/dL (ref 70–99)
Glucose-Capillary: 87 mg/dL (ref 70–99)

## 2019-02-14 NOTE — Progress Notes (Signed)
  Speech Language Pathology Treatment: Dysphagia  Patient Details Name: [name] MRN: [id number] DOB: [birth year] Today's Date: 02/14/2019 Time: [id number] SLP Time Calculation (min) (ACUTE ONLY): 10 min  Assessment / Plan / Recommendation Clinical Impression  Pt is now decannulated after trach came out on previous date. PMV goals no longer indicated. He voices without significant evidence of air leak from his stoma, although his voice is very soft, speech mostly mumbled with limited intelligibility. Pt has limited engagement in PO trials. He resists oral care and most POs offered. He did get three single ice chips in his oral cavity given Max cues from SLP including when to open his lips, seal them again around the spoon. Even this small amount is effortful, and then pt declines additional boluses by turning his head away or trying to fold his whole body in half. Recommend that he remain NPO with alternative means of nutrition. Will f/u for additional therapy as his engagement increases.    HPI HPI: Pt is a 69 y/o M admitted with L MCA CVA s/p TPA. He was intubated 12/11-12/13, reintubated 12/13; trach 12/21. PMH: tobacco use, previous strokes, GERD, COPD, AAA, PVD, HTN, HLD, pulmonary nodule, depression, afib (recently missed doses of coumadin)      SLP Plan  Continue with current plan of care       Recommendations  Diet recommendations: NPO Medication Administration: Via alternative means                Oral Care Recommendations: Oral care QID Follow up Recommendations: Skilled Nursing facility SLP Visit Diagnosis: Dysphagia, unspecified (R13.10) Plan: Continue with current plan of care       GO                 [name]., M.A. [place] [facility] Pager [phone] Office [phone]  02/14/2019, 2:14 PM

## 2019-02-14 NOTE — Progress Notes (Addendum)
Physical Therapy Treatment Patient Details Name: MANINDER POPELKA MRN: [id number] DOB: [birth year] Today's Date: 02/14/2019    History of Present Illness 38yM admitted 01/20/19 with hypertensive emergency in setting of acute L MCA stroke s/p tPA, AF/RVR.  Has been noncompliant with his warfarin for A. Fib. Intubated for respiratory failure. Trach 01/29/19 with PEG planned for 01/30/19. No sedation at time of PT eval.     PT Comments    Pt was more alert, still not able to maintain focus on task, but did follow multimodal cuing biased on tactile directional cues.  Emphasis on transition to EOB, sitting balance at EOB, sit to stand, standing activity with w/shifting stepping, transfers to [facility].    Follow Up Recommendations  SNF;Supervision/Assistance - 24 hour;Other (comment)     Equipment Recommendations  Other (comment)(TBA)    Recommendations for Other Services       Precautions / Restrictions Precautions Precautions: Fall Precaution Comments: multiple lines    Mobility  Bed Mobility Overal bed mobility: Needs Assistance       Supine to sit: Mod assist Sit to supine: Mod assist;+2 for physical assistance   General bed mobility comments: Cuing for direction  Transfers Overall transfer level: Needs assistance Equipment used: None Transfers: Sit to/from [employer] Transfers Sit to Stand: Mod assist Stand pivot transfers: Mod assist(to BSC)       General transfer comment: face to face assist with cuing for direction.  Stability and w/shift assist  Ambulation/Gait             General Gait Details: pivotal steps only   Stairs             Wheelchair Mobility    Modified Rankin (Stroke Patients Only) Modified Rankin (Stroke Patients Only) Pre-Morbid Rankin Score: No symptoms Modified Rankin: Severe disability     Balance   Sitting-balance support: Single extremity supported;Bilateral upper extremity supported;Feet supported Sitting  balance-Leahy Scale: Poor(to fair) Sitting balance - Comments: pt still kyphotic and biased toward jack-knife flexion position..  Pt can sit forward without assist, he can sit upright with L UE assist, but never fully upright..     Standing balance-Leahy Scale: Poor Standing balance comment: face to face standing assist.  pt needed stability assist with tendency to list R due to sagging R knee control.  With redirection to standing task, pt could extend both kness and shift weight with assist to pivot to chair.                            Cognition Arousal/Alertness: Awake/alert Behavior During Therapy: Flat affect Overall Cognitive Status: Impaired/Different from baseline                   Orientation Level: Place;Time;Situation Current Attention Level: Focused   Following Commands: Follows one step commands inconsistently;Follows one step commands with increased time Safety/Judgement: Decreased awareness of safety;Decreased awareness of deficits Awareness: Intellectual Problem Solving: Slow processing;Decreased initiation;Difficulty sequencing;Requires verbal cues;Requires tactile cues        Exercises Other Exercises Other Exercises: Warm up ROM to LE's with attempt to get movement to command.    General Comments        Pertinent Vitals/Pain Pain Assessment: Faces Faces Pain Scale: No hurt Pain Intervention(s): Monitored during session    Home Living                      Prior Function  PT Goals (current goals can now be found in the care plan section) Acute Rehab PT Goals PT Goal Formulation: Patient unable to participate in goal setting Time For Goal Achievement: 02/20/19 Potential to Achieve Goals: Fair Progress towards PT goals: Progressing toward goals    Frequency    Min 3X/week      PT Plan Current plan remains appropriate;Frequency needs to be updated    Co-evaluation              AM-PAC PT "6 Clicks"  Mobility   Outcome Measure  Help needed turning from your back to your side while in a flat bed without using bedrails?: A Lot Help needed moving from lying on your back to sitting on the side of a flat bed without using bedrails?: A Lot Help needed moving to and from a bed to a chair (including a wheelchair)?: A Lot Help needed standing up from a chair using your arms (e.g., wheelchair or bedside chair)?: A Lot Help needed to walk in hospital room?: Total Help needed climbing 3-5 steps with a railing? : Total 6 Click Score: 10    End of Session     Patient left: in bed;with call bell/phone within reach;with bed alarm set;with restraints reapplied;with SCD's reapplied Nurse Communication: Mobility status PT Visit Diagnosis: Hemiplegia and hemiparesis;Other abnormalities of gait and mobility (R26.89);Other symptoms and signs involving the nervous system (R29.898) Hemiplegia - Right/Left: Right Hemiplegia - dominant/non-dominant: Dominant Hemiplegia - caused by: Cerebral infarction     Time: [id number] PT Time Calculation (min) (ACUTE ONLY): 27 min  Charges:  $Therapeutic Activity: 8-22 mins $Neuromuscular Re-education: 8-22 mins                     02/14/2019  [name], PT Acute Rehabilitation Services [phone]  (pager) [phone]  (office)   [name] Iverson Sees 02/14/2019, 2:32 PM

## 2019-02-14 NOTE — Care Management Important Message (Signed)
Important Message  Patient Details  Name: [name] MRN: [id number] Date of Birth: December 26, 1950   Medicare Important Message Given:  Yes     Orbie Pyo 02/14/2019, 2:20 PM

## 2019-02-14 NOTE — Progress Notes (Signed)
PROGRESS NOTE    THUNDER [place]GEWATER   [id number]  DOB: 1950/11/05  DOA: 01/18/2019 PCP: Shon Baton, MD   Brief Narrative:  [name] 69 yo male smoker presented with HTN emergency and acute Lt MCA CVA s/p thrombolytic therapy, and A fib with RVR.    He required intubation for airway protection.  Sputum culture grew out streptococcal pneumonia and Enterobacter cloacae and he was treated with antibiotics. He had difficulty weaning from vent and required tracheostomy and PEG tube.  12/10 tPA administered 12/11 intubated 12/21 Tracheostomy 12/22 PEG placed 01/04 patient pulled out trach >> left out  Subjective: Patient is confused and not answering questions appropriately.    Assessment & Plan:   Principal Problem: Left embolic stroke-history of CVA -With left gaze, right hemiparesis, right neglect and acute confusion -Treated with TPA 06/18/2018 -CT head in ED-no acute findings -CT head 01/19/2019: Interval demarcation of acute cortically based infarct within the left frontal lobe - CTA head and neck- Prominent soft plaque at the left ICA bulb. 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  -MRI 12/12: Multifocal acute ischemia within left MCA territory including the left postcentral gyrus-no hemorrhage or mass-effect-old right occipital and cerebellar infarcts-chronic ischemic microangiopathy -2D echo 12/11: EF 55 to 60%-no thrombus or embolus noted -LDL 182 -Hemoglobin A1c 5.1 -Initially treated with heparin-she was on warfarin prior to admission and INR was 1.1 on admission -he is now on Eliquis since 12/26  Active Problems: A. fib with RVR -Started on amiodarone and Cardizem infusions -Currently on metoprolol 100 mg twice daily, amiodarone 200 mg daily and Cardizem-short acting 30 mg every 6 hours  Delirium-acute metabolic encephalopathy -Vitamin B12 level was 161-supplements have been started  -Continues to be quite confused-receiving Seroquel and Depakote  Acute respiratory failure-hypoxic -Initially intubated for airway protection secondary to acute CVA -The patient also developed pneumonia with cultures growing out strep pneumonia and Enterobacter cloacae and was treated with cefepime -Underwent tracheostomy on 12/21 -He pulled his trach out on 1/4  Dysphagia severe protein calorie malnutrition -He continues to have a PEG tube -Continue Osmolite 474 mL 3 times daily -prostat 30 cc daily  Enterobacter UTI -Started on ceftriaxone on 1/2 -WBC count noted to be improving  Iron deficiency anemia-macrocytosis -IV iron given  Thrombocytosis -started on Hydrea by Dr. Marin Olp on 12/28-JAK2 assay was negative -Platelet count improving after IV iron given -Bone marrow biopsy deferred -Last note by Dr. Marin Olp was on 1/4  Tobacco abuse    Time spent in minutes: 40  DVT prophylaxis: Eliquis Code Status: Full code Family Communication:  Disposition Plan: SNF Consultants:   PCCM admitted patient  Neurology  Hematology  Palliative care  General surgery Procedures:  Transthoracic Echocardiogram  01/19/2019 IMPRESSIONS 1. Left ventricular ejection fraction, by visual estimation, is 55 to 60%. The left ventricle has normal function. There is moderately increased left ventricular hypertrophy. 2. The left ventricle demonstrates regional wall motion abnormalities. 3. Global right ventricle has normal systolic function.The right ventricular size is normal. No increase in right ventricular wall thickness. 4. Left atrial size was normal. 5. Right atrial size was mildly dilated. 6. The mitral valve is normal in structure. Mild to moderate mitral valve regurgitation. 7. The tricuspid valve is normal in structure. Tricuspid valve regurgitation is trivial. 8. The aortic valve is normal in structure. Aortic valve regurgitation is not visualized. 9. The pulmonic  valve was normal in structure. Pulmonic valve regurgitation  is not visualized. 10. The atrial septum is grossly normal.  Antimicrobials:  Anti-infectives (From admission, onward)   Start     Dose/Rate Route Frequency Ordered Stop   02/12/19 1800  cefTRIAXone (ROCEPHIN) 1 g in sodium chloride 0.9 % 100 mL IVPB     1 g 200 mL/hr over 30 Minutes Intravenous  Once 02/12/19 1348 02/13/19 0412   02/10/19 1430  cefTRIAXone (ROCEPHIN) 1 g in sodium chloride 0.9 % 100 mL IVPB     1 g 200 mL/hr over 30 Minutes Intravenous Every 24 hours 02/10/19 1347 02/11/19 1754   01/26/19 1000  ceFEPIme (MAXIPIME) 2 g in sodium chloride 0.9 % 100 mL IVPB     2 g 200 mL/hr over 30 Minutes Intravenous Every 8 hours 01/26/19 0955 02/02/19 0145   01/25/19 1000  Ampicillin-Sulbactam (UNASYN) 3 g in sodium chloride 0.9 % 100 mL IVPB  Status:  Discontinued     3 g 200 mL/hr over 30 Minutes Intravenous Every 8 hours 01/25/19 0810 01/26/19 0955       Objective: Vitals:   02/14/19 0500 02/14/19 0746 02/14/19 1137 02/14/19 1546  BP:  (!) 161/97 (!) 125/93 96/78  Pulse:  (!) 112 72 64  Resp:  '18 18 18  ' Temp:  98.5 F (36.9 C) 98 F (36.7 C) 98.3 F (36.8 C)  TempSrc:  Oral Oral Oral  SpO2:  96% 99% 98%  Weight: 60.5 kg     Height:        Intake/Output Summary (Last 24 hours) at 02/14/2019 1654 Last data filed at 02/14/2019 1400 Gross per 24 hour  Intake 474 ml  Output 500 ml  Net -26 ml   Filed Weights   02/11/19 0500 02/12/19 0500 02/14/19 0500  Weight: 71.3 kg 66.3 kg 60.5 kg    Examination: General exam: Appears comfortable  HEENT: PERRLA, oral mucosa moist, no sclera icterus or thrush Respiratory system: Clear to auscultation. Respiratory effort normal. Cardiovascular system: S1 & S2 heard, RRR.   Gastrointestinal system: Abdomen soft, non-tender, nondistended. Normal bowel sounds.  PEG tube is present Central nervous system: Alert-confused and not following commands Extremities: No cyanosis,  clubbing or edema Skin: No rashes or ulcers    Data Reviewed: I have personally reviewed following labs and imaging studies  CBC: Recent Labs  Lab 02/08/19 0150 02/09/19 0330 02/12/19 0223 02/14/19 0842  WBC 11.0* 13.0* 15.0* 13.3*  NEUTROABS  --   --   --  9.9*  HGB 13.5 14.1 13.3 14.4  HCT 39.9 41.9 39.5 43.2  MCV 102.0* 101.7* 102.3* 102.9*  PLT 829* 776* 522* 099*   Basic Metabolic Panel: Recent Labs  Lab 02/08/19 0150 02/09/19 0330  NA 136 138  K 4.0 3.9  CL 102 101  CO2 24 28  GLUCOSE 121* 54*  BUN 20 19  CREATININE 0.72 0.75  CALCIUM 9.2 9.6   GFR: Estimated Creatinine Clearance: 74.6 mL/min (by C-G formula based on SCr of 0.75 mg/dL). Liver Function Tests: No results for input(s): AST, ALT, ALKPHOS, BILITOT, PROT, ALBUMIN in the last 168 hours. No results for input(s): LIPASE, AMYLASE in the last 168 hours. No results for input(s): AMMONIA in the last 168 hours. Coagulation Profile: No results for input(s): INR, PROTIME in the last 168 hours. Cardiac Enzymes: No results for input(s): CKTOTAL, CKMB, CKMBINDEX, TROPONINI in the last 168 hours. BNP (last 3 results) No results for input(s): PROBNP in the last 8760 hours. HbA1C: No results for input(s): HGBA1C in the last  72 hours. CBG: Recent Labs  Lab 02/14/19 0021 02/14/19 0451 02/14/19 0748 02/14/19 1138 02/14/19 1548  GLUCAP 115* 87 84 135* 76   Lipid Profile: No results for input(s): CHOL, HDL, LDLCALC, TRIG, CHOLHDL, LDLDIRECT in the last 72 hours. Thyroid Function Tests: No results for input(s): TSH, T4TOTAL, FREET4, T3FREE, THYROIDAB in the last 72 hours. Anemia Panel: No results for input(s): VITAMINB12, FOLATE, FERRITIN, TIBC, IRON, RETICCTPCT in the last 72 hours. Urine analysis:    Component Value Date/Time   COLORURINE YELLOW 02/09/2019 1300   APPEARANCEUR HAZY (A) 02/09/2019 1300   LABSPEC 1.010 02/09/2019 1300   PHURINE 8.0 02/09/2019 1300   GLUCOSEU NEGATIVE 02/09/2019 1300    GLUCOSEU NEGATIVE 12/13/2012 1514   HGBUR NEGATIVE 02/09/2019 1300   BILIRUBINUR NEGATIVE 02/09/2019 1300   KETONESUR NEGATIVE 02/09/2019 1300   PROTEINUR NEGATIVE 02/09/2019 1300   UROBILINOGEN 0.2 12/13/2012 1514   NITRITE POSITIVE (A) 02/09/2019 1300   LEUKOCYTESUR NEGATIVE 02/09/2019 1300   Sepsis Labs: '@[email or web address]' (procalcitonin:4,lacticidven:4) ) Recent Results (from the past 240 hour(s))  Urine Culture     Status: Abnormal   Collection Time: 02/10/19  6:10 PM   Specimen: Urine, Catheterized  Result Value Ref Range Status   Specimen Description URINE, CATHETERIZED  Final   Special Requests   Final    URINE, CLEAN CATCH Performed at [facility], [place] [address], [place], [place] [address]    Culture >=100,000 COLONIES/mL ENTEROBACTER CLOACAE (A)  Final   Report Status 02/13/2019 FINAL  Final   Organism ID, Bacteria ENTEROBACTER CLOACAE (A)  Final      Susceptibility   Enterobacter cloacae - MIC*    CEFAZOLIN >=64 RESISTANT Resistant     CIPROFLOXACIN <=0.25 SENSITIVE Sensitive     GENTAMICIN <=1 SENSITIVE Sensitive     IMIPENEM <=0.25 SENSITIVE Sensitive     NITROFURANTOIN <=16 SENSITIVE Sensitive     TRIMETH/SULFA <=20 SENSITIVE Sensitive     PIP/TAZO >=128 RESISTANT Resistant     * >=100,000 COLONIES/mL ENTEROBACTER CLOACAE         Radiology Studies: No results found.    Scheduled Meds: . amiodarone  200 mg Per Tube Daily  . apixaban  5 mg Per Tube BID  . atorvastatin  80 mg Per Tube q1800  . chlorhexidine  15 mL Mouth Rinse BID  . cyanocobalamin  1,000 mcg Intramuscular Daily  . diltiazem  30 mg Per Tube Q6H  . feeding supplement (OSMOLITE 1.5 CAL)  474 mL Per Tube TID  . feeding supplement (PRO-STAT SUGAR FREE 64)  30 mL Per Tube Q0600  . folic acid  2 mg Oral Daily  . furosemide  20 mg Oral Daily  . hydroxyurea  500 mg Per Tube Daily  . insulin aspart  0-15 Units Subcutaneous Q4H  . mouth rinse  15 mL Mouth Rinse q12n4p  . metoprolol  tartrate  100 mg Per Tube BID  . nystatin  5 mL Oral TID AC & HS  . QUEtiapine  37.5 mg Oral BID  . sennosides  5 mL Per Tube BID  . valproic acid  250 mg Oral TID   Continuous Infusions: . sodium chloride 10 mL/hr at 02/01/19 0541     LOS: 27 days      Debbe Odea, MD [facility] Pager: [email or web address] Password [email or web address] 02/14/2019, 4:54 PM

## 2019-02-14 NOTE — Progress Notes (Signed)
Thankfully, his platelet count continues to respond nicely.  His platelet count now is 412,000.  I am not sure if this is the Hydrea or if this is the IV iron that he received.  His hemoglobin is 14.4.  His white cell count is 13.3.  His JAK2 and is Calreticulin are both negative.  As such, I doubt that he has an underlying myeloproliferative problem.  His hypercoagulable studies were all negative.  I will just keep him on the [facility] for right now.  Hopefully if we still see his platelet count responding, we will stop the Hydrea and see how everything looks with his platelets.  Neurologically, it is hard to say what type of progress or what kind of function he will have.  Lattie Haw, MD  1 Corinthians 15:58

## 2019-02-14 NOTE — Progress Notes (Signed)
STROKE TEAM PROGRESS NOTE   INTERVAL HISTORY Patient was agitated last night.  He remains intermittently agitated requiring restraints.  He remains aphasic. Vitals stable. Platelets continue decline OBJECTIVE Vitals:   02/14/19 0449 02/14/19 0500 02/14/19 0746 02/14/19 1137  BP:   (!) 161/97 (!) 125/93  Pulse:   (!) 112 72  Resp:   18 18  Temp: 97.6 F (36.4 C)  98.5 F (36.9 C) 98 F (36.7 C)  TempSrc:   Oral Oral  SpO2:   96% 99%  Weight:  60.5 kg    Height:        CBC:  Recent Labs  Lab 02/12/19 0223 02/14/19 0842  WBC 15.0* 13.3*  NEUTROABS  --  9.9*  HGB 13.3 14.4  HCT 39.5 43.2  MCV 102.3* 102.9*  PLT 522* 412*    Basic Metabolic Panel:  Recent Labs  Lab 02/08/19 0150 02/09/19 0330  NA 136 138  K 4.0 3.9  CL 102 101  CO2 24 28  GLUCOSE 121* 54*  BUN 20 19  CREATININE 0.72 0.75  CALCIUM 9.2 9.6   IMAGING past 24h No results found.    PHYSICAL EXAM    General - frail elderly Caucasian male, on trach collar, awake, looking around and restless  Ophthalmologic - fundi not visualized due to noncooperation.  Cardiovascular - irregularly irregular heart rate and rhythm.  Neuro - pt sitting up in bed, on trach collar, awake, eyes open, looking around, not following commands, intermittently agitated. Eyes left gaze preference, able to cross midline, right gaze incomplete. Able to say "god damn it" out from trach. Blinking to visual threat to the left but not to the ritght. Right neglect. PERRL. Right facial droop. Tongue protrusion not cooperative. Left UE at least 4/5, purposeful strong movement against gravity. RUE 2/5 on pain stimulation, more at tricep. BLEs withdraw to pain, at least 4/5 on the left and 3-/5 on the right. DTR 1+ and bilateral babinski. Sensation, coordination and gait not tested.   ASSESSMENT/PLAN [name] is a 69 y.o. male with history of tobacco use, previous strokes, COPD, AAA, ASPVD, HTN, HLD, pulmonary nodule,  depression, atrial fibrillation on coumadin but recently missed doses presenting with transient aphasia and leaning to the right. The patient received IV t-PA Thursday 01/18/19 @ 2000.  Stroke - left MCA infarct s/p t-PA - embolic pattern, likely due to atrial fibrillation with subtherapeutic INR  Resultant global aphasia, left gaze, right hemiparesis, right neglect and agitation  Code Stroke CT Head - No acute finding by CT. Chronic small-vessel ischemic changes of the white matter. Old right cerebellar and right occipital infarctions. ASPECTS is 10   CT head - Interval demarcation of an acute cortically based infarct within the lateral left frontal lobe as described. No acute intracranial hemorrhage or significant mass effect.  MRI head -  Multifocal acute ischemia within the left MCA territory, including the left postcentral gyrus. No hemorrhage or mass effect. 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 Chronic ischemic microangiopathy.  CTA H&N - Prominent soft plaque at the left ICA bulb. 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   CT 12/16 stable, continued evolution of left MCA infarcts  2D Echo - EF 55 to 60%. No cardiac source of emboli identified.   Sars Corona Virus 2 - negative  LDL - 182  HgbA1c - 5.1  Hypercoagulable work up ordered by hematology - mostly negative thus far ,  Homocysteine 19.8 due to tobacco abuse  VTE prophylaxis -Heparin subq  warfarin daily prior to admission, now on Eliquis  Therapy recommendations: SNF  Disposition:  Pending   Afib with RVR  On amiodarone and Cardizem po  On metoprolol 100 bid   Not compliant with warfarin PTA, INR 1.1 on admission INR 1.2 on 02/02/2019  On heparin IV and coumadin  Switched to Eliquis 12/26  Respiratory failure with hypoxia, resolved  Intubated -> s/p trach 12/21 -> trach collar 12/27  Off vent 12/27  CCM following 2-3 times per  week  Planned to downsize trach to size 4 cuffless this week  Pt Pulled out trach 1/4, not in respiratory distress  CCM saw, will leave trach out  Stoma care until stoma closes  B12 deficiency  Vitamin B12 = 161  B12 supplement  History of stroke  11/2014, bilateral blurry vision after left upper extremity bypass surgery.  Found to have left central vision loss.  MRI showed bilateral embolic infarcts.  INR 1.45.  Carotid Doppler negative.  TTE unremarkable.  LDL 82, A1c 5.4.  Continued on discharge with Coumadin and Lipitor 40  Hypotension, resolved Hx of hypertension  Home BP meds: Diltiazem, Lasix and metoprolol  Current BP meds clonidine 0.3 q 6h , metoprolol 25 bid  BP stable  Off neo  On Lasix 20 daily and metoprolol 100 twice daily now  Hyperlipidemia  Home Lipid lowering medication: Lipitor 40 mg daily  LDL 182, goal < 70  Current lipid lowering medication: Lipitor 80 mg daily   Continue statin at discharge  Agitation   Initially difficult extubation due to agitation - was on ativan versed, and Risperdal.   Eventually extubated  Now more restless and agitated for the last 2-3 days however today seemed better  Last EKG QTc 482   On seroquel 37.5 mg bid   On valproic acid 250 q8h  Dysphagia  On bolus TF now  S/p PEG 12/22  Speech on board  Continue NPO with alternative feeds  VAP, treated Entrobacter UTI, treated  Fever (resolved) and leukocytosis  T-max 101.7->...afebrile    WBC 15.0 (afebrile)  CXR 02/04/19 - Stable ventilation since 01/29/2019 with suspected bilateral pleural effusions and lower lobe collapse.  UA neg 01/18/19  Repeat CXR 01/09/2020 - Mildly increased left basilar consolidation which may represent atelectasis. Probable small left pleural effusion.   Was on cefepime (started 01/26/19 x 7 days) for S.pneumoniae and E.cloacae in sputum  Repeat UA - abnormal (nitrite positive with many bacteria but no WBCs) -  culture - Entrobacter UTI treated w/ Rocephin for 3 days.   CCM on board  Tobacco abuse  Current smoker  Smoking cessation counseling will be provided  Pt is willing to quit  Likely the cause of mildly elevated homocysteine level  Thrombocytosis, possibly d/t Fe deficiency  Platelet as high as 925, now 522 on 1/4   Hematology consulted  has large and giant PLTS on smear  Possibly reactive, doubt myeloproliferative d/c as normal PTA  Not a candidate for bone marrow bx  Hypercoagulable work up largely negative with some negative thus far  Placed on empiric hydroxyurea, plan increase if PLTS remain stable  Labs:  calreticulin pending, Jak2 negative    Fe deficiency  Fe 41, Fol 4.7, Sat ratios low  On feraheme  Other Stroke Risk Factors  Advanced age  ETOH use, advised to drink no more than 1 alcoholic beverage per day.  Hx narcotic dependence  AAA  PAD s/p L axilla to brachial and L axilla to ulnar BPG  Other Active Problems  Hypokalemia, resolved   Oral thrush on nystatin   Palliative Care consult 12/25 -> brother wants aggressive care -> (Dr Leonie Man contacted [facility] per family request; however,  [facility] would not agree to accept transfer)    Hospital day # 27   Appreciate hematology help and management of thrombocytosis.  We will ask medical hospitalist team to help with management of medical problems and transfer to their service    Continue valproic acid and Seroquel for sedation and agitation. Greater than 50% time during this 25-minute visit was spent on counseling and coordination of care and discussion with care team.    Antony Contras, MD Medical Director [place] Pager: [phone] 02/14/2019 2:46 PM

## 2019-02-15 LAB — CBC WITH DIFFERENTIAL/PLATELET
Abs Immature Granulocytes: 0.06 10*3/uL (ref 0.00–0.07)
Basophils Absolute: 0 10*3/uL (ref 0.0–0.1)
Basophils Relative: 1 %
Eosinophils Absolute: 0.1 10*3/uL (ref 0.0–0.5)
Eosinophils Relative: 1 %
HCT: 41.5 % (ref 39.0–52.0)
Hemoglobin: 13.7 g/dL (ref 13.0–17.0)
Immature Granulocytes: 1 %
Lymphocytes Relative: 35 %
Lymphs Abs: 2.9 10*3/uL (ref 0.7–4.0)
MCH: 33.7 pg (ref 26.0–34.0)
MCHC: 33 g/dL (ref 30.0–36.0)
MCV: 102.2 fL — ABNORMAL HIGH (ref 80.0–100.0)
Monocytes Absolute: 0.5 10*3/uL (ref 0.1–1.0)
Monocytes Relative: 6 %
Neutro Abs: 4.7 10*3/uL (ref 1.7–7.7)
Neutrophils Relative %: 56 %
Platelets: 380 10*3/uL (ref 150–400)
RBC: 4.06 MIL/uL — ABNORMAL LOW (ref 4.22–5.81)
RDW: 14.4 % (ref 11.5–15.5)
WBC: 8.3 10*3/uL (ref 4.0–10.5)
nRBC: 0 % (ref 0.0–0.2)

## 2019-02-15 LAB — GLUCOSE, CAPILLARY: Glucose-Capillary: 83 mg/dL (ref 70–99)

## 2019-02-15 MED ORDER — QUETIAPINE FUMARATE 50 MG PO TABS: 75.0000 mg | ORAL_TABLET | Freq: Every day | ORAL | Status: DC

## 2019-02-15 MED ORDER — QUETIAPINE FUMARATE 100 MG PO TABS
100.0000 mg | ORAL_TABLET | Freq: Every day | ORAL | Status: DC
  Administered 2019-02-15: 100 mg via ORAL
  Filled 2019-02-15: qty 1

## 2019-02-15 MED ORDER — QUETIAPINE FUMARATE 50 MG PO TABS
75.0000 mg | ORAL_TABLET | Freq: Every day | ORAL | Status: DC
  Administered 2019-02-15: 75 mg
  Filled 2019-02-15 (×4): qty 2

## 2019-02-15 MED ORDER — VALPROIC ACID 250 MG/5ML PO SOLN
250.0000 mg | Freq: Three times a day (TID) | ORAL | Status: DC
  Administered 2019-02-15: 11:00:00 250 mg
  Filled 2019-02-15 (×3): qty 5

## 2019-02-15 MED ORDER — DIVALPROEX SODIUM 125 MG PO CSDR: 250.0000 mg | DELAYED_RELEASE_CAPSULE | Freq: Three times a day (TID) | ORAL | Status: DC

## 2019-02-15 MED ORDER — VALPROIC ACID 250 MG/5ML PO SOLN
500.0000 mg | Freq: Two times a day (BID) | ORAL | Status: DC
  Administered 2019-02-15: 500 mg
  Filled 2019-02-15 (×3): qty 10

## 2019-02-15 NOTE — Progress Notes (Signed)
Thankfully, the platelet count is even lower.  It is now 380,000.  I am going to stop the Hydrea.  I will see any problems with stopping the Hydrea.  I doubt that he has a myeloproliferative disorder.  It is certainly possible that the thrombocytosis was from iron deficiency.  Typically, you do not see that kind of brisk reaction to low iron.  There is no hypercoagulable state that he has that led to the stroke.  I suppose this could be from his atrial fibrillation.  We will see how his platelet count responds to the discontinuation of the Hydrea.  Lattie Haw, MD  Darlyn Chamber 17:14

## 2019-02-15 NOTE — Progress Notes (Signed)
Patient agitated this am - cussing, saying "no" over and over again, and kicked at staff member. Dr. Wynelle Cleveland increased dose of Seroquel. In the afternoon, nighttime dose of Seroquel increased again, as well as an increase in Depakote dose. Around 1600 patient did fall asleep and has been mostly resting calmly since then. Patient's restraints continued.

## 2019-02-15 NOTE — Progress Notes (Signed)
Occupational Therapy Treatment Patient Details Name: [name] MRN: [id number] DOB: 20-Feb-1950 Today's Date: 02/15/2019    History of present illness 42yM admitted 01/20/19 with hypertensive emergency in setting of acute L MCA stroke s/p tPA, AF/RVR.  Has been noncompliant with his warfarin for A. Fib. Intubated for respiratory failure. Trach 01/29/19 with PEG planned for 01/30/19. No sedation at time of PT eval.    OT comments  Pt progressing to OOB. Pt following commands inconsistently with multimodal cues. Pt mumbling throughout session; leaning forward in sitting and difficult to sustain pt's attention. Hand over hand technique for combing hair with LUE and RUE with maxA overall. Pt holding comb with both hands and limited ability to comb back of hair. Pt modA for front to front stand pivot transfer; pt automatically placing arms around OT. Pt maxA to totalA for ADL; limited by poor attention, decreased ability to care for self and decreased awareness of deficits. Pt stood 20-30 secs each time for transfer x2 times as RN required pt in bed today for safety. OT following acutely and requires continued OT. Pt more likely to be seen 1x weekly by OT so frequency changed.     Follow Up Recommendations  SNF;Supervision/Assistance - 24 hour    Equipment Recommendations  Other (comment)(to be determined at next venue)    Recommendations for Other Services      Precautions / Restrictions Precautions Precautions: Fall Precaution Comments: multiple lines Restrictions Weight Bearing Restrictions: No       Mobility Bed Mobility Overal bed mobility: Needs Assistance       Supine to sit: Mod assist Sit to supine: Mod assist;+2 for physical assistance   General bed mobility comments: Cueing to follow commands  Transfers Overall transfer level: Needs assistance Equipment used: None Transfers: Sit to/from [employer] Transfers Sit to Stand: Mod assist Stand pivot  transfers: Mod assist       General transfer comment: Frontal transfer; RN asking for pt in bed due to restraints required. Pt stood for each transfer ~20-30secs with minA overall.    Balance Overall balance assessment: Needs assistance Sitting-balance support: Single extremity supported;Bilateral upper extremity supported;Feet supported Sitting balance-Leahy Scale: Fair Sitting balance - Comments: Pt requires constant supervision if EOB as pt leans far forward     Standing balance-Leahy Scale: Poor Standing balance comment: Pt's knees blocked in order to increase smoothness of transfer; pt's LLE lagging behind.                           ADL either performed or assessed with clinical judgement   ADL Overall ADL's : Needs assistance/impaired Eating/Feeding: NPO   Grooming: Maximal assistance;Bed level;Sitting Grooming Details (indicate cue type and reason): Hand over hand technique for combing hair with LUE and RUE. Pt holding comb with both hands and limited ability to comb back of hair..                 Toilet Transfer: Moderate assistance;Stand-pivot Toilet Transfer Details (indicate cue type and reason): front to front transfer; pt automatically placing arms around OT.         Functional mobility during ADLs: Moderate assistance;+2 for physical assistance;+2 for safety/equipment;Cueing for safety;Cueing for sequencing General ADL Comments: Pt maxA to [place]; limited by poor attention, decreased ability to care for self and decreased awareness of deficits.     Vision   Vision Assessment?: Vision impaired- to be further tested in functional context Additional Comments:  unable to focus enough for more than scanning for OT in room   Perception     Praxis      Cognition Arousal/Alertness: Awake/alert Behavior During Therapy: Flat affect Overall Cognitive Status: Impaired/Different from baseline Area of Impairment: Orientation;Attention;Memory;Following  commands;Safety/judgement;Awareness;Problem solving                 Orientation Level: Place;Time;Situation Current Attention Level: Focused   Following Commands: Follows one step commands inconsistently;Follows one step commands with increased time Safety/Judgement: Decreased awareness of safety;Decreased awareness of deficits Awareness: Intellectual Problem Solving: Slow processing;Decreased initiation;Difficulty sequencing;Requires verbal cues;Requires tactile cues General Comments: Pt mumbling throughout session; leaning forward and difficult to get pt's attention.        Exercises     Shoulder Instructions       General Comments      Pertinent Vitals/ Pain       Pain Assessment: Faces Faces Pain Scale: No hurt Pain Intervention(s): Monitored during session  Home Living                                          Prior Functioning/Environment              Frequency  Min 1X/week        Progress Toward Goals  OT Goals(current goals can now be found in the care plan section)  Progress towards OT goals: Progressing toward goals  Acute Rehab OT Goals Patient Stated Goal: none stated OT Goal Formulation: Patient unable to participate in goal setting Time For Goal Achievement: 02/21/19 Potential to Achieve Goals: Fair ADL Goals Pt Will Perform Eating: with set-up;with adaptive utensils;sitting Pt Will Perform Grooming: with min assist;sitting Pt Will Perform Upper Body Dressing: with min assist;sitting Pt Will Transfer to Toilet: with mod assist;stand pivot transfer;bedside commode Pt/caregiver will Perform Home Exercise Program: Right Upper extremity;Increased strength;Increased ROM;With minimal assist Additional ADL Goal #1: pt will follow 75% of commands in 5/5 trials  Plan Discharge plan remains appropriate;Frequency needs to be updated    Co-evaluation                 AM-PAC OT "6 Clicks" Daily Activity     Outcome  Measure   Help from another person eating meals?: Total Help from another person taking care of personal grooming?: A Lot Help from another person toileting, which includes using toliet, bedpan, or urinal?: Total Help from another person bathing (including washing, rinsing, drying)?: Total Help from another person to put on and taking off regular upper body clothing?: A Lot Help from another person to put on and taking off regular lower body clothing?: Total 6 Click Score: 8    End of Session Equipment Utilized During Treatment: Oxygen  OT Visit Diagnosis: Unsteadiness on feet (R26.81);Muscle weakness (generalized) (M62.81);Cognitive communication deficit (R41.841) Symptoms and signs involving cognitive functions: Cerebral infarction   Activity Tolerance Patient tolerated treatment well   Patient Left in bed;with call bell/phone within reach;with bed alarm set;with nursing/sitter in room   Nurse Communication Mobility status        Time: [id number] OT Time Calculation (min): 31 min  Charges: OT General Charges $OT Visit: 1 Visit OT Treatments $Self Care/Home Management : 8-22 mins $Neuromuscular Re-education: 8-22 mins  Jefferey Pica OTR/L Acute Rehabilitation Services Pager: [phone] Office: [place] 02/15/2019, 4:00 PM

## 2019-02-15 NOTE — Progress Notes (Addendum)
PROGRESS NOTE    [name]   HDQ:[id number]  DOB: January 17, 1951  DOA: 01/18/2019 PCP: Shon Baton, MD   Brief Narrative:  [name] 69 yo male smoker presented with HTN emergency and acute Lt MCA CVA s/p thrombolytic therapy, and A fib with RVR.    He required intubation for airway protection.  Sputum culture grew out streptococcal pneumonia and Enterobacter cloacae and he was treated with antibiotics. He had difficulty weaning from vent and required tracheostomy and PEG tube.  12/10 tPA administered 12/11 intubated 12/21 Tracheostomy 12/22 PEG placed 01/04 patient pulled out trach >> left out  Subjective: No complaints.     Assessment & Plan:   Principal Problem: Left embolic stroke-history of CVA -With left gaze, right hemiparesis, right neglect and acute confusion -Treated with TPA 06/18/2018 -CT head in ED-no acute findings -CT head 01/19/2019: Interval demarcation of acute cortically based infarct within the left frontal lobe - CTA head and neck- Prominent soft plaque at the left ICA bulb. 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  -MRI 12/12: Multifocal acute ischemia within left MCA territory including the left postcentral gyrus-no hemorrhage or mass-effect-old right occipital and cerebellar infarcts-chronic ischemic microangiopathy -2D echo 12/11: EF 55 to 60%-no thrombus or embolus noted -LDL 182- Lipitor increased from 40 to 80 mg daily -Hemoglobin A1c 5.1 -Initially treated with heparin-she was on warfarin prior to admission and INR was 1.1 on admission -he is now on Eliquis since 12/26  Active Problems: A. fib with RVR -Started on amiodarone and Cardizem infusions -Currently on metoprolol 100 mg twice daily, amiodarone 200 mg daily and Cardizem,short acting 30 mg every 6 hours  Delirium-acute metabolic encephalopathy -Vitamin B12 level was 161-supplements have been  started -Continues to be quite confused-receiving Seroquel and Depakote -   I will increase Seroquel from 37.5 in AM to 75 today as RN states that he remains confused and very agitated during the day. Will be increasing bedtime dose as well but will need to see how he responds to the increased AM dose before deciding on a PM dose.   Enterobacter UTI -Started on ceftriaxone on 1/2- stop date will be 1/9 -WBC count noted to be improving  Acute respiratory failure-hypoxic -Initially intubated for airway protection secondary to acute CVA -The patient also developed pneumonia with cultures growing out strep pneumonia and Enterobacter cloacae and was treated with cefepime -Underwent tracheostomy on 12/21 -He pulled his trach out on 1/4  Dysphagia severe protein calorie malnutrition -He continues to have a PEG tube -Continue Osmolite 474 mL 3 times daily -prostat 30 cc daily - refusing to eat/drink thus I will cont all food and meds via tube  Iron deficiency anemia with macrocytosis -Feraheme given on 12/29 - Macrocytosis likely due to B12 deficiency and ETOH abuse  Thrombocytosis -started on Hydrea by Dr. Marin Olp on 12/28-JAK2 assay was negative -Platelet count improving after IV iron given- Hydrea d/c'd 1/7 -Bone marrow biopsy deferred   Tobacco abuse    Time spent in minutes: 40  DVT prophylaxis: Eliquis Code Status: Full code Family Communication:  Disposition Plan: SNF Consultants:   PCCM admitted patient  Neurology  Hematology  Palliative care  General surgery Procedures:  Transthoracic Echocardiogram  01/19/2019 IMPRESSIONS 1. Left ventricular ejection fraction, by visual estimation, is 55 to 60%. The left ventricle has normal function. There is moderately increased left ventricular hypertrophy. 2. The left ventricle demonstrates regional wall motion abnormalities. 3.  Global right ventricle has normal systolic function.The right ventricular size is normal. No  increase in right ventricular wall thickness. 4. Left atrial size was normal. 5. Right atrial size was mildly dilated. 6. The mitral valve is normal in structure. Mild to moderate mitral valve regurgitation. 7. The tricuspid valve is normal in structure. Tricuspid valve regurgitation is trivial. 8. The aortic valve is normal in structure. Aortic valve regurgitation is not visualized. 9. The pulmonic valve was normal in structure. Pulmonic valve regurgitation is not visualized. 10. The atrial septum is grossly normal.  Antimicrobials:  Anti-infectives (From admission, onward)   Start     Dose/Rate Route Frequency Ordered Stop   02/12/19 1800  cefTRIAXone (ROCEPHIN) 1 g in sodium chloride 0.9 % 100 mL IVPB     1 g 200 mL/hr over 30 Minutes Intravenous  Once 02/12/19 1348 02/13/19 0412   02/10/19 1430  cefTRIAXone (ROCEPHIN) 1 g in sodium chloride 0.9 % 100 mL IVPB     1 g 200 mL/hr over 30 Minutes Intravenous Every 24 hours 02/10/19 1347 02/11/19 1754   01/26/19 1000  ceFEPIme (MAXIPIME) 2 g in sodium chloride 0.9 % 100 mL IVPB     2 g 200 mL/hr over 30 Minutes Intravenous Every 8 hours 01/26/19 0955 02/02/19 0145   01/25/19 1000  Ampicillin-Sulbactam (UNASYN) 3 g in sodium chloride 0.9 % 100 mL IVPB  Status:  Discontinued     3 g 200 mL/hr over 30 Minutes Intravenous Every 8 hours 01/25/19 0810 01/26/19 0955       Objective: Vitals:   02/15/19 0319 02/15/19 0459 02/15/19 0500 02/15/19 0745  BP: 115/75   (!) 140/96  Pulse: 70   66  Resp: 17   18  Temp:  97.9 F (36.6 C)  (!) 97.5 F (36.4 C)  TempSrc:  Oral  Axillary  SpO2: 100%   99%  Weight:   60.5 kg   Height:        Intake/Output Summary (Last 24 hours) at 02/15/2019 1003 Last data filed at 02/14/2019 2300 Gross per 24 hour  Intake 948 ml  Output 350 ml  Net 598 ml   Filed Weights   02/12/19 0500 02/14/19 0500 02/15/19 0500  Weight: 66.3 kg 60.5 kg 60.5 kg    Examination: General exam: Appears comfortable   HEENT: PERRLA, oral mucosa moist, no sclera icterus or thrush Respiratory system: Clear to auscultation. Respiratory effort normal. Cardiovascular system: S1 & S2 heard,  No murmurs  Gastrointestinal system: Abdomen soft, non-tender, nondistended. Normal bowel sounds  - PEG tube present Central nervous system: Alert - nonverbal with me Extremities: No cyanosis, clubbing or edema Skin: No rashes or ulcers      Data Reviewed: I have personally reviewed following labs and imaging studies  CBC: Recent Labs  Lab 02/09/19 0330 02/12/19 0223 02/14/19 0842 02/15/19 0519  WBC 13.0* 15.0* 13.3* 8.3  NEUTROABS  --   --  9.9* 4.7  HGB 14.1 13.3 14.4 13.7  HCT 41.9 39.5 43.2 41.5  MCV 101.7* 102.3* 102.9* 102.2*  PLT 776* 522* 412* 811   Basic Metabolic Panel: Recent Labs  Lab 02/09/19 0330  NA 138  K 3.9  CL 101  CO2 28  GLUCOSE 54*  BUN 19  CREATININE 0.75  CALCIUM 9.6   GFR: Estimated Creatinine Clearance: 74.6 mL/min (by C-G formula based on SCr of 0.75 mg/dL). Liver Function Tests: No results for input(s): AST, ALT, ALKPHOS, BILITOT, PROT, ALBUMIN in the last 168 hours.  No results for input(s): LIPASE, AMYLASE in the last 168 hours. No results for input(s): AMMONIA in the last 168 hours. Coagulation Profile: No results for input(s): INR, PROTIME in the last 168 hours. Cardiac Enzymes: No results for input(s): CKTOTAL, CKMB, CKMBINDEX, TROPONINI in the last 168 hours. BNP (last 3 results) No results for input(s): PROBNP in the last 8760 hours. HbA1C: No results for input(s): HGBA1C in the last 72 hours. CBG: Recent Labs  Lab 02/14/19 1138 02/14/19 1548 02/14/19 2029 02/14/19 2350 02/15/19 0438  GLUCAP 135* 76 111* 125* 83   Lipid Profile: No results for input(s): CHOL, HDL, LDLCALC, TRIG, CHOLHDL, LDLDIRECT in the last 72 hours. Thyroid Function Tests: No results for input(s): TSH, T4TOTAL, FREET4, T3FREE, THYROIDAB in the last 72 hours. Anemia Panel: No  results for input(s): VITAMINB12, FOLATE, FERRITIN, TIBC, IRON, RETICCTPCT in the last 72 hours. Urine analysis:    Component Value Date/Time   COLORURINE YELLOW 02/09/2019 1300   APPEARANCEUR HAZY (A) 02/09/2019 1300   LABSPEC 1.010 02/09/2019 1300   PHURINE 8.0 02/09/2019 1300   GLUCOSEU NEGATIVE 02/09/2019 1300   GLUCOSEU NEGATIVE 12/13/2012 1514   HGBUR NEGATIVE 02/09/2019 1300   BILIRUBINUR NEGATIVE 02/09/2019 1300   KETONESUR NEGATIVE 02/09/2019 1300   PROTEINUR NEGATIVE 02/09/2019 1300   UROBILINOGEN 0.2 12/13/2012 1514   NITRITE POSITIVE (A) 02/09/2019 1300   LEUKOCYTESUR NEGATIVE 02/09/2019 1300   Sepsis Labs: '@LABRCNTIP' (procalcitonin:4,lacticidven:4) ) Recent Results (from the past 240 hour(s))  Urine Culture     Status: Abnormal   Collection Time: 02/10/19  6:10 PM   Specimen: Urine, Catheterized  Result Value Ref Range Status   Specimen Description URINE, CATHETERIZED  Final   Special Requests   Final    URINE, CLEAN CATCH Performed at [facility], [place] [address], [place], [place] [address]    Culture >=100,000 COLONIES/mL ENTEROBACTER CLOACAE (A)  Final   Report Status 02/13/2019 FINAL  Final   Organism ID, Bacteria ENTEROBACTER CLOACAE (A)  Final      Susceptibility   Enterobacter cloacae - MIC*    CEFAZOLIN >=64 RESISTANT Resistant     CIPROFLOXACIN <=0.25 SENSITIVE Sensitive     GENTAMICIN <=1 SENSITIVE Sensitive     IMIPENEM <=0.25 SENSITIVE Sensitive     NITROFURANTOIN <=16 SENSITIVE Sensitive     TRIMETH/SULFA <=20 SENSITIVE Sensitive     PIP/TAZO >=128 RESISTANT Resistant     * >=100,000 COLONIES/mL ENTEROBACTER CLOACAE         Radiology Studies: No results found.    Scheduled Meds: . amiodarone  200 mg Per Tube Daily  . apixaban  5 mg Per Tube BID  . atorvastatin  80 mg Per Tube q1800  . chlorhexidine  15 mL Mouth Rinse BID  . cyanocobalamin  1,000 mcg Intramuscular Daily  . diltiazem  30 mg Per Tube Q6H  . feeding supplement  (OSMOLITE 1.5 CAL)  474 mL Per Tube TID  . feeding supplement (PRO-STAT SUGAR FREE 64)  30 mL Per Tube Q0600  . folic acid  2 mg Oral Daily  . furosemide  20 mg Oral Daily  . mouth rinse  15 mL Mouth Rinse q12n4p  . metoprolol tartrate  100 mg Per Tube BID  . QUEtiapine  75 mg Per Tube Daily  . sennosides  5 mL Per Tube BID  . valproic acid  250 mg Per Tube TID   Continuous Infusions: . sodium chloride 10 mL/hr at 02/01/19 0541     LOS: 28 days  Debbe Odea, MD [facility] Pager: [email or web address] Password [email or web address] 02/15/2019, 10:03 AM

## 2019-02-15 NOTE — Progress Notes (Signed)
STROKE TEAM PROGRESS NOTE   INTERVAL HISTORY Patient was agitated last night.  He remains intermittently agitated requiring restraints.  He remains aphasic. Vitals stable. Platelets continue decline 380,000 today OBJECTIVE Vitals:   02/15/19 0459 02/15/19 0500 02/15/19 0745 02/15/19 1207  BP:   (!) 140/96 121/80  Pulse:   66 70  Resp:   18 15  Temp: 97.9 F (36.6 C)  (!) 97.5 F (36.4 C) 97.9 F (36.6 C)  TempSrc: Oral  Axillary Axillary  SpO2:   99% 100%  Weight:  60.5 kg    Height:        CBC:  Recent Labs  Lab 02/14/19 0842 02/15/19 0519  WBC 13.3* 8.3  NEUTROABS 9.9* 4.7  HGB 14.4 13.7  HCT 43.2 41.5  MCV 102.9* 102.2*  PLT 412* [id number]    Basic Metabolic Panel:  Recent Labs  Lab 02/09/19 0330  NA 138  K 3.9  CL 101  CO2 28  GLUCOSE 54*  BUN 19  CREATININE 0.75  CALCIUM 9.6   IMAGING past 24h No results found.    PHYSICAL EXAM    General - frail elderly Caucasian male, on trach collar, awake, looking around and restless  Ophthalmologic - fundi not visualized due to noncooperation.  Cardiovascular - irregularly irregular heart rate and rhythm.  Neuro - pt sitting up in bed, on trach collar, awake, eyes open, looking around, not following commands, intermittently agitated. Eyes left gaze preference, able to cross midline, right gaze incomplete. Able to say "god damn it" out from trach. Blinking to visual threat to the left but not to the ritght. Right neglect. PERRL. Right facial droop. Tongue protrusion not cooperative. Left UE at least 4/5, purposeful strong movement against gravity. RUE 2/5 on pain stimulation, more at tricep. BLEs withdraw to pain, at least 4/5 on the left and 3-/5 on the right. DTR 1+ and bilateral babinski. Sensation, coordination and gait not tested.   ASSESSMENT/PLAN [name] is a 69 y.o. male with history of tobacco use, previous strokes, COPD, AAA, ASPVD, HTN, HLD, pulmonary nodule, depression, atrial fibrillation  on coumadin but recently missed doses presenting with transient aphasia and leaning to the right. The patient received IV t-PA Thursday 01/18/19 @ 2000.  Stroke - left MCA infarct s/p t-PA - embolic pattern, likely due to atrial fibrillation with subtherapeutic INR  Resultant global aphasia, left gaze, right hemiparesis, right neglect and agitation  Code Stroke CT Head - No acute finding by CT. Chronic small-vessel ischemic changes of the white matter. Old right cerebellar and right occipital infarctions. ASPECTS is 10   CT head - Interval demarcation of an acute cortically based infarct within the lateral left frontal lobe as described. No acute intracranial hemorrhage or significant mass effect.  MRI head -  Multifocal acute ischemia within the left MCA territory, including the left postcentral gyrus. No hemorrhage or mass effect. 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 Chronic ischemic microangiopathy.  CTA H&N - Prominent soft plaque at the left ICA bulb. 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   CT 12/16 stable, continued evolution of left MCA infarcts  2D Echo - EF 55 to 60%. No cardiac source of emboli identified.   Sars Corona Virus 2 - negative  LDL - 182  HgbA1c - 5.1  Hypercoagulable work up ordered by hematology - mostly negative thus far , Homocysteine 19.8 due to tobacco abuse  VTE prophylaxis -Heparin subq  warfarin daily prior to admission, now on Eliquis  Therapy recommendations: SNF  Disposition:  Pending   Afib with RVR  On amiodarone and Cardizem po  On metoprolol 100 bid   Not compliant with warfarin PTA, INR 1.1 on admission INR 1.2 on 02/02/2019  On heparin IV and coumadin  Switched to Eliquis 12/26  Respiratory failure with hypoxia, resolved  Intubated -> s/p trach 12/21 -> trach collar 12/27  Off vent 12/27  CCM following 2-3 times per week  Planned to downsize  trach to size 4 cuffless this week  Pt Pulled out trach 1/4, not in respiratory distress  CCM saw, will leave trach out  Stoma care until stoma closes  B12 deficiency  Vitamin B12 = 161  B12 supplement  History of stroke  11/2014, bilateral blurry vision after left upper extremity bypass surgery.  Found to have left central vision loss.  MRI showed bilateral embolic infarcts.  INR 1.45.  Carotid Doppler negative.  TTE unremarkable.  LDL 82, A1c 5.4.  Continued on discharge with Coumadin and Lipitor 40  Hypotension, resolved Hx of hypertension  Home BP meds: Diltiazem, Lasix and metoprolol  Current BP meds clonidine 0.3 q 6h , metoprolol 25 bid  BP stable  Off neo  On Lasix 20 daily and metoprolol 100 twice daily now  Hyperlipidemia  Home Lipid lowering medication: Lipitor 40 mg daily  LDL 182, goal < 70  Current lipid lowering medication: Lipitor 80 mg daily   Continue statin at discharge  Agitation   Initially difficult extubation due to agitation - was on ativan versed, and Risperdal.   Eventually extubated  Now more restless and agitated for the last 2-3 days however today seemed better  Last EKG QTc 482   On seroquel 37.5 mg bid   On valproic acid 250 q8h  Dysphagia  On bolus TF now  S/p PEG 12/22  Speech on board  Continue NPO with alternative feeds  VAP, treated Entrobacter UTI, treated  Fever (resolved) and leukocytosis  T-max 101.7->...afebrile    WBC 15.0 (afebrile)  CXR 02/04/19 - Stable ventilation since 01/29/2019 with suspected bilateral pleural effusions and lower lobe collapse.  UA neg 01/18/19  Repeat CXR 01/09/2020 - Mildly increased left basilar consolidation which may represent atelectasis. Probable small left pleural effusion.   Was on cefepime (started 01/26/19 x 7 days) for S.pneumoniae and E.cloacae in sputum  Repeat UA - abnormal (nitrite positive with many bacteria but no WBCs) - culture - Entrobacter UTI treated  w/ Rocephin for 3 days.   CCM on board  Tobacco abuse  Current smoker  Smoking cessation counseling will be provided  Pt is willing to quit  Likely the cause of mildly elevated homocysteine level  Thrombocytosis, possibly d/t Fe deficiency  Platelet as high as 925, now 522 on 1/4   Hematology consulted  has large and giant PLTS on smear  Possibly reactive, doubt myeloproliferative d/c as normal PTA  Not a candidate for bone marrow bx  Hypercoagulable work up largely negative with some negative thus far  Placed on empiric hydroxyurea, plan increase if PLTS remain stable  Labs:  calreticulin pending, Jak2 negative    Fe deficiency  Fe 41, Fol 4.7, Sat ratios low  On feraheme  Other Stroke Risk Factors  Advanced age  ETOH use, advised to drink no more than 1 alcoholic beverage per day.  Hx narcotic dependence  AAA  PAD s/p L axilla to brachial and L axilla to ulnar BPG  Other Active Problems  Hypokalemia, resolved   Oral thrush on nystatin   Palliative Care consult 12/25 -> brother wants aggressive care -> (Dr Leonie Man contacted [place] per family request; however,  [facility] would not agree to accept transfer)    Hospital day # 28   Appreciate hematology and  medical hospitalist team to help with management of medical problems and transfer to their service    Continue valproic acid and Seroquel for sedation and agitation.Stroke team  will sign off. Call for questions.    Antony Contras, MD Medical Director [facility] Pager: [phone] 02/15/2019 3:49 PM

## 2019-02-16 ENCOUNTER — Inpatient Hospital Stay (HOSPITAL_COMMUNITY): Payer: Medicare Other

## 2019-02-16 DIAGNOSIS — R509 Fever, unspecified: Secondary | ICD-10-CM

## 2019-02-16 DIAGNOSIS — R0682 Tachypnea, not elsewhere classified: Secondary | ICD-10-CM

## 2019-02-16 DIAGNOSIS — R000 Tachycardia, unspecified: Secondary | ICD-10-CM

## 2019-02-16 LAB — URINALYSIS, ROUTINE W REFLEX MICROSCOPIC
Bilirubin Urine: NEGATIVE
Glucose, UA: NEGATIVE mg/dL
Hgb urine dipstick: NEGATIVE
Ketones, ur: NEGATIVE mg/dL
Nitrite: POSITIVE — AB
Protein, ur: NEGATIVE mg/dL
Specific Gravity, Urine: 1.012 (ref 1.005–1.030)
pH: 7 (ref 5.0–8.0)

## 2019-02-16 LAB — CBC WITH DIFFERENTIAL/PLATELET
Abs Immature Granulocytes: 0.11 10*3/uL — ABNORMAL HIGH (ref 0.00–0.07)
Basophils Absolute: 0 10*3/uL (ref 0.0–0.1)
Basophils Relative: 0 %
Eosinophils Absolute: 0 10*3/uL (ref 0.0–0.5)
Eosinophils Relative: 0 %
HCT: 41.9 % (ref 39.0–52.0)
Hemoglobin: 14.1 g/dL (ref 13.0–17.0)
Immature Granulocytes: 1 %
Lymphocytes Relative: 8 %
Lymphs Abs: 1.4 10*3/uL (ref 0.7–4.0)
MCH: 34.1 pg — ABNORMAL HIGH (ref 26.0–34.0)
MCHC: 33.7 g/dL (ref 30.0–36.0)
MCV: 101.2 fL — ABNORMAL HIGH (ref 80.0–100.0)
Monocytes Absolute: 0.9 10*3/uL (ref 0.1–1.0)
Monocytes Relative: 5 %
Neutro Abs: 15 10*3/uL — ABNORMAL HIGH (ref 1.7–7.7)
Neutrophils Relative %: 86 %
Platelets: 342 10*3/uL (ref 150–400)
RBC: 4.14 MIL/uL — ABNORMAL LOW (ref 4.22–5.81)
RDW: 14.4 % (ref 11.5–15.5)
WBC: 17.4 10*3/uL — ABNORMAL HIGH (ref 4.0–10.5)
nRBC: 0 % (ref 0.0–0.2)

## 2019-02-16 LAB — BASIC METABOLIC PANEL
Anion gap: 13 (ref 5–15)
BUN: 23 mg/dL (ref 8–23)
CO2: 26 mmol/L (ref 22–32)
Calcium: 9.9 mg/dL (ref 8.9–10.3)
Chloride: 96 mmol/L — ABNORMAL LOW (ref 98–111)
Creatinine, Ser: 0.87 mg/dL (ref 0.61–1.24)
GFR calc Af Amer: >60 mL/min (ref >60)
GFR calc non Af Amer: >60 mL/min (ref >60)
Glucose, Bld: 88 mg/dL (ref 70–99)
Potassium: 4.4 mmol/L (ref 3.5–5.1)
Sodium: 135 mmol/L (ref 135–145)

## 2019-02-16 MED ORDER — SODIUM CHLORIDE 0.9 % IV SOLN
1.0000 g | INTRAVENOUS | Status: AC
  Administered 2019-02-16 – 2019-02-22 (×7): 1 g via INTRAVENOUS
  Filled 2019-02-16 (×7): qty 1

## 2019-02-16 MED ORDER — VITAMIN B-12 1000 MCG PO TABS
1000.0000 ug | ORAL_TABLET | Freq: Every day | ORAL | Status: DC
  Administered 2019-02-17 – 2019-03-03 (×15): 1000 ug
  Filled 2019-02-16 (×15): qty 1

## 2019-02-16 MED ORDER — QUETIAPINE FUMARATE 100 MG PO TABS
100.0000 mg | ORAL_TABLET | Freq: Every evening | ORAL | Status: DC | PRN
  Administered 2019-02-17 – 2019-02-18 (×2): 100 mg via ORAL
  Filled 2019-02-16 (×4): qty 1

## 2019-02-16 MED ORDER — VALPROIC ACID 250 MG/5ML PO SOLN
500.0000 mg | Freq: Two times a day (BID) | ORAL | Status: DC
  Administered 2019-02-16: 500 mg
  Filled 2019-02-16 (×2): qty 10

## 2019-02-16 NOTE — Progress Notes (Signed)
Physical Therapy Treatment Patient Details Name: [name] MRN: [id number] DOB: [birth year] Today's Date: 02/16/2019    History of Present Illness 60yM admitted 01/20/19 with hypertensive emergency in setting of acute L MCA stroke s/p tPA, AF/RVR.  Has been noncompliant with his warfarin for A. Fib. Intubated for respiratory failure. Trach 01/29/19 with PEG planned for 01/30/19. No sedation at time of PT eval.     PT Comments    Pt very lethargic. Difficult to fully waken for participation in therapy. He received an increased dose of Seroquel last night. He required mod assist bed mobility. Tolerating sitting EOB x 7 minutes with very flexed posture (head resting on arms in lap). Unable to safely progress beyond EOB due to lethargy. Eyes closed throughout session and pt mumbling. Pt also tachy with sustained HR in 120s and sporadic increases into 130s and 140s. Pt returned to bed and restraints re-applied.     Follow Up Recommendations  SNF;Supervision/Assistance - 24 hour     Equipment Recommendations  Other (comment)(TBD)    Recommendations for Other Services       Precautions / Restrictions Precautions Precautions: Fall    Mobility  Bed Mobility Overal bed mobility: Needs Assistance Bed Mobility: Supine to Sit;Sit to Supine     Supine to sit: Mod assist;HOB elevated Sit to supine: Mod assist;HOB elevated   General bed mobility comments: verbal/tactile cues for sequencing  Transfers                 General transfer comment: unable to safely progress beyond EOB due to lethargy  Ambulation/Gait                 Stairs             Wheelchair Mobility    Modified Rankin (Stroke Patients Only) Modified Rankin (Stroke Patients Only) Pre-Morbid Rankin Score: No symptoms Modified Rankin: Severe disability     Balance Overall balance assessment: Needs assistance Sitting-balance support: Feet supported Sitting balance-Leahy Scale:  Fair Sitting balance - Comments: Pt requires constant supervision if EOB as pt leans far forward. Tolerated sitting x 7 minutes.                                    Cognition Arousal/Alertness: Lethargic;Suspect due to medications Behavior During Therapy: Flat affect Overall Cognitive Status: Difficult to assess                                 General Comments: Pt mumbling throughout session; leaning forward and difficult to get pt's attention. Very lethargic.      Exercises      General Comments General comments (skin integrity, edema, etc.): Tachy with mobility. Sustained HR in 120s with sporadic increases into 130s and 140s sitting EOB.      Pertinent Vitals/Pain Pain Assessment: Faces Faces Pain Scale: No hurt    Home Living                      Prior Function            PT Goals (current goals can now be found in the care plan section) Acute Rehab PT Goals Patient Stated Goal: none stated Progress towards PT goals: Not progressing toward goals - comment(increased lethargy)    Frequency    Min 3X/week  PT Plan Current plan remains appropriate    Co-evaluation              AM-PAC PT "6 Clicks" Mobility   Outcome Measure  Help needed turning from your back to your side while in a flat bed without using bedrails?: A Little Help needed moving from lying on your back to sitting on the side of a flat bed without using bedrails?: A Lot Help needed moving to and from a bed to a chair (including a wheelchair)?: A Lot Help needed standing up from a chair using your arms (e.g., wheelchair or bedside chair)?: A Lot Help needed to walk in hospital room?: Total Help needed climbing 3-5 steps with a railing? : Total 6 Click Score: 11    End of Session   Activity Tolerance: Patient limited by lethargy Patient left: in bed;with call bell/phone within reach;with bed alarm set;with restraints reapplied Nurse Communication:  Mobility status PT Visit Diagnosis: Hemiplegia and hemiparesis;Other abnormalities of gait and mobility (R26.89);Other symptoms and signs involving the nervous system (R29.898) Hemiplegia - Right/Left: Right Hemiplegia - dominant/non-dominant: Dominant Hemiplegia - caused by: Cerebral infarction     Time: [id number] PT Time Calculation (min) (ACUTE ONLY): 28 min  Charges:  $Therapeutic Activity: 8-22 mins $Neuromuscular Re-education: 8-22 mins                     Lorrin Goodell, PT  Office # [phone] Pager [phone]    Lorriane Shire 02/16/2019, 11:04 AM

## 2019-02-16 NOTE — Op Note (Signed)
   Procedure Note  Date: 02/16/2019  Procedure: esophagogastroduodenoscopy (EGD) and percutaneous endoscopic gastrostomy (PEG) tube placement  Pre-op diagnosis: dysphagia Post-op diagnosis: same  Indication and clinical history: 64M with dysphagia  Surgeon: Jesusita Oka, MD Assistant: Ileene Rubens, PA  Anesthesia: MAC  Findings: normal EGD . Specimen: none . EBL: <5cc . Drains/Implants: PEG tube  Disposition: PACU  Description of Procedure: The patient was positioned semi-recumbent. Time-out was performed verifying correct patient, procedure, signature of informed consent, and pre-operative antibiotics as indicated. MAC induction was uneventful and a bite block was placed into the oropharynx. The endoscope was inserted into the oropharynx and advanced down the esophagus into the stomach and into the duodenum. The visualized esophagus and duodenum were unremarkable. The endoscope was retracted back into the stomach and the stomach was insufflated. The stomach was inspected and was also normal. Transillumination was performed. The light was visible on the external skin and dimpling of the stomach was noted endoscopically with manual pressure. The abdomen was prepped and draped in the usual sterile fashion. Transillumination and dimpling were repeated and local anesthetic was infiltrated to make a skin wheal at the site of transillumination. The needle was inserted perpendicularly to the skin and the tip of the needle was visualized endoscopically. As the needle was retracted, the tract was also anesthetized. A skin nick was made at the site of the wheal and an introducer needle and sheath were inserted. The needle was removed and guidewire inserted. The guidewire was grasped by an endoscopic snare and the snare, guidewire, and endoscope retracted out of the oropharynx. The PEG tube was secured to the guidewire and retracted through the mouth and esophagus into the stomach. The PEG tube was secured  with a bolster and was visualized endoscopically to spin freely circumferentially and also be without gaps between the internal bumper and the stomach wall. There was no evidence of bleeding. The PEG bolster was secured at the skin and there were no gaps between the bolster and the abdominal wall. The stomach was desufflated endoscopically and the endoscope removed. The bite block was also removed. The patient tolerated the procedure well and there were no complications.   The patient may have water and medications administered via the PEG tube beginning immediately and tube feeds may be initiated four hours post-procedure.    Jesusita Oka, MD General and [facility]

## 2019-02-16 NOTE — Progress Notes (Signed)
PROGRESS NOTE    [name]   [id number]  DOB: 01/05/1951  DOA: 01/18/2019 PCP: Shon Baton, MD   Brief Narrative:  [name] 69 yo male smoker presented with HTN emergency and acute Lt MCA CVA s/p thrombolytic therapy, and A fib with RVR.    He required intubation for airway protection.  Sputum culture grew out streptococcal pneumonia and Enterobacter cloacae and he was treated with antibiotics. He had difficulty weaning from vent and required tracheostomy and PEG tube.  12/10 tPA administered 12/11 intubated 12/21 Tracheostomy 12/22 PEG placed 01/04 patient pulled out trach >> left out  Subjective:  Asleep this AM.     Assessment & Plan:   Principal Problem: Left embolic stroke-history of CVA -With left gaze, right hemiparesis, right neglect and acute confusion -Treated with TPA 06/18/2018 -CT head in ED-no acute findings -CT head 01/19/2019: Interval demarcation of acute cortically based infarct within the left frontal lobe - CTA head and neck- Prominent soft plaque at the left ICA bulb. 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  -MRI 12/12: Multifocal acute ischemia within left MCA territory including the left postcentral gyrus-no hemorrhage or mass-effect-old right occipital and cerebellar infarcts-chronic ischemic microangiopathy -2D echo 12/11: EF 55 to 60%-no thrombus or embolus noted -LDL 182- Lipitor increased from 40 to 80 mg daily -Hemoglobin A1c 5.1 -Initially treated with heparin-she was on warfarin prior to admission and INR was 1.1 on admission -he is now on Eliquis since 12/26  Active Problems: A. fib with RVR -Started on amiodarone and Cardizem infusions -Currently on metoprolol 100 mg twice daily, amiodarone 200 mg daily and Cardizem,short acting 30 mg every 6 hours  Delirium-acute metabolic encephalopathy -Vitamin B12 level was 161-supplements have been  started -Continues to be quite confused-receiving Seroquel and Depakote -1/7>  I will increase Seroquel from 37.5 in AM to 75 today as RN states that he remains confused and very agitated during the day. Will be increasing bedtime dose as well but will need to see how he responds to the increased AM dose before deciding on a PM dose. - 1/8> note from RN yesterday states the patient fell asleep around 4 PM- today, he is asleep with fevers and tachycardia- see below- hold sedative today.  Fever/ tachycardia/ leukocytosis - recheck blood cultures, UA, Urine culture and CXR - resume Ceftriaxone- the medication "fell off " the [facility]- d/w pharmacy  Enterobacter UTI -Started on ceftriaxone on 1/2- stop date will be 1/9 -WBC count noted to be improving  Acute respiratory failure-hypoxic -Initially intubated for airway protection secondary to acute CVA -The patient also developed pneumonia with cultures growing out strep pneumonia and Enterobacter cloacae and was treated with cefepime -Underwent tracheostomy on 12/21 -He pulled his trach out on 1/4  Dysphagia severe protein calorie malnutrition -He continues to have a PEG tube -Continue Osmolite 474 mL 3 times daily -prostat 30 cc daily - refusing to eat/drink thus I will cont all food and meds via tube  Iron deficiency anemia with macrocytosis -Feraheme given on 12/29 - Macrocytosis likely due to B12 deficiency and ETOH abuse  Thrombocytosis -started on Hydrea by Dr. Marin Olp on 12/28-JAK2 assay was negative -Platelet count improving after IV iron given- Hydrea d/c'd 1/7 -Bone marrow biopsy deferred   Tobacco abuse    Time spent in minutes: 40  DVT prophylaxis: Eliquis Code Status: Full code Family Communication:  Disposition Plan: SNF Consultants:   PCCM admitted patient  Neurology  Hematology  Palliative care  General surgery Procedures:  Transthoracic Echocardiogram  01/19/2019 IMPRESSIONS 1. Left ventricular  ejection fraction, by visual estimation, is 55 to 60%. The left ventricle has normal function. There is moderately increased left ventricular hypertrophy. 2. The left ventricle demonstrates regional wall motion abnormalities. 3. Global right ventricle has normal systolic function.The right ventricular size is normal. No increase in right ventricular wall thickness. 4. Left atrial size was normal. 5. Right atrial size was mildly dilated. 6. The mitral valve is normal in structure. Mild to moderate mitral valve regurgitation. 7. The tricuspid valve is normal in structure. Tricuspid valve regurgitation is trivial. 8. The aortic valve is normal in structure. Aortic valve regurgitation is not visualized. 9. The pulmonic valve was normal in structure. Pulmonic valve regurgitation is not visualized. 10. The atrial septum is grossly normal.  Antimicrobials:  Anti-infectives (From admission, onward)   Start     Dose/Rate Route Frequency Ordered Stop   02/16/19 1045  cefTRIAXone (ROCEPHIN) 1 g in sodium chloride 0.9 % 100 mL IVPB     1 g 200 mL/hr over 30 Minutes Intravenous Every 24 hours 02/16/19 1042     02/12/19 1800  cefTRIAXone (ROCEPHIN) 1 g in sodium chloride 0.9 % 100 mL IVPB     1 g 200 mL/hr over 30 Minutes Intravenous  Once 02/12/19 1348 02/13/19 0412   02/10/19 1430  cefTRIAXone (ROCEPHIN) 1 g in sodium chloride 0.9 % 100 mL IVPB     1 g 200 mL/hr over 30 Minutes Intravenous Every 24 hours 02/10/19 1347 02/11/19 1754   01/26/19 1000  ceFEPIme (MAXIPIME) 2 g in sodium chloride 0.9 % 100 mL IVPB     2 g 200 mL/hr over 30 Minutes Intravenous Every 8 hours 01/26/19 0955 02/02/19 0145   01/25/19 1000  Ampicillin-Sulbactam (UNASYN) 3 g in sodium chloride 0.9 % 100 mL IVPB  Status:  Discontinued     3 g 200 mL/hr over 30 Minutes Intravenous Every 8 hours 01/25/19 0810 01/26/19 0955       Objective: Vitals:   02/15/19 1637 02/15/19 1929 02/15/19 2309 02/16/19 0755  BP: 100/79  98/74 134/89 132/85  Pulse: 72 79 74 (!) 105  Resp: '15 16 17 ' (!) 25  Temp: 97.9 F (36.6 C) (!) 97.2 F (36.2 C) 97.6 F (36.4 C) (!) 100.9 F (38.3 C)  TempSrc: Oral Axillary Axillary Axillary  SpO2: 97% 98% 99% 96%  Weight:      Height:        Intake/Output Summary (Last 24 hours) at 02/16/2019 1059 Last data filed at 02/16/2019 0500 Gross per 24 hour  Intake --  Output 1100 ml  Net -1100 ml   Filed Weights   02/12/19 0500 02/14/19 0500 02/15/19 0500  Weight: 66.3 kg 60.5 kg 60.5 kg    Examination: General exam: Appears comfortable- asleep   HEENT: PERRLA, oral mucosa moist, no sclera icterus or thrush- tracheotomy scar healing Respiratory system: Clear to auscultation. Respiratory effort normal. Cardiovascular system: S1 & S2 heard,  No murmurs - tachycardia Gastrointestinal system: Abdomen soft, non-tender, nondistended. Normal bowel sounds  - PEG tube present Central nervous system: asleep Extremities: No cyanosis, clubbing or edema Skin: No rashes or ulcers    Data Reviewed: I have personally reviewed following labs and imaging studies  CBC: Recent Labs  Lab 02/12/19 0223 02/14/19 0842 02/15/19 0519 02/16/19 0905  WBC 15.0* 13.3* 8.3 17.4*  NEUTROABS  --  9.9* 4.7 15.0*  HGB 13.3  14.4 13.7 14.1  HCT 39.5 43.2 41.5 41.9  MCV 102.3* 102.9* 102.2* 101.2*  PLT 522* 412* 380 588   Basic Metabolic Panel: Recent Labs  Lab 02/16/19 0905  NA 135  K 4.4  CL 96*  CO2 26  GLUCOSE 88  BUN 23  CREATININE 0.87  CALCIUM 9.9   GFR: Estimated Creatinine Clearance: 68.6 mL/min (by C-G formula based on SCr of 0.87 mg/dL). Liver Function Tests: No results for input(s): AST, ALT, ALKPHOS, BILITOT, PROT, ALBUMIN in the last 168 hours. No results for input(s): LIPASE, AMYLASE in the last 168 hours. No results for input(s): AMMONIA in the last 168 hours. Coagulation Profile: No results for input(s): INR, PROTIME in the last 168 hours. Cardiac Enzymes: No results  for input(s): CKTOTAL, CKMB, CKMBINDEX, TROPONINI in the last 168 hours. BNP (last 3 results) No results for input(s): PROBNP in the last 8760 hours. HbA1C: No results for input(s): HGBA1C in the last 72 hours. CBG: Recent Labs  Lab 02/14/19 1138 02/14/19 1548 02/14/19 2029 02/14/19 2350 02/15/19 0438  GLUCAP 135* 76 111* 125* 83   Lipid Profile: No results for input(s): CHOL, HDL, LDLCALC, TRIG, CHOLHDL, LDLDIRECT in the last 72 hours. Thyroid Function Tests: No results for input(s): TSH, T4TOTAL, FREET4, T3FREE, THYROIDAB in the last 72 hours. Anemia Panel: No results for input(s): VITAMINB12, FOLATE, FERRITIN, TIBC, IRON, RETICCTPCT in the last 72 hours. Urine analysis:    Component Value Date/Time   COLORURINE YELLOW 02/09/2019 1300   APPEARANCEUR HAZY (A) 02/09/2019 1300   LABSPEC 1.010 02/09/2019 1300   PHURINE 8.0 02/09/2019 1300   GLUCOSEU NEGATIVE 02/09/2019 1300   GLUCOSEU NEGATIVE 12/13/2012 1514   HGBUR NEGATIVE 02/09/2019 1300   BILIRUBINUR NEGATIVE 02/09/2019 1300   KETONESUR NEGATIVE 02/09/2019 1300   PROTEINUR NEGATIVE 02/09/2019 1300   UROBILINOGEN 0.2 12/13/2012 1514   NITRITE POSITIVE (A) 02/09/2019 1300   LEUKOCYTESUR NEGATIVE 02/09/2019 1300   Sepsis Labs: '@LABRCNTIP' (procalcitonin:4,lacticidven:4) ) Recent Results (from the past 240 hour(s))  Urine Culture     Status: Abnormal   Collection Time: 02/10/19  6:10 PM   Specimen: Urine, Catheterized  Result Value Ref Range Status   Specimen Description URINE, CATHETERIZED  Final   Special Requests   Final    URINE, CLEAN CATCH Performed at [facility], [place] [address], [place], [place] [address]    Culture >=100,000 COLONIES/mL ENTEROBACTER CLOACAE (A)  Final   Report Status 02/13/2019 FINAL  Final   Organism ID, Bacteria ENTEROBACTER CLOACAE (A)  Final      Susceptibility   Enterobacter cloacae - MIC*    CEFAZOLIN >=64 RESISTANT Resistant     CIPROFLOXACIN <=0.25 SENSITIVE Sensitive      GENTAMICIN <=1 SENSITIVE Sensitive     IMIPENEM <=0.25 SENSITIVE Sensitive     NITROFURANTOIN <=16 SENSITIVE Sensitive     TRIMETH/SULFA <=20 SENSITIVE Sensitive     PIP/TAZO >=128 RESISTANT Resistant     * >=100,000 COLONIES/mL ENTEROBACTER CLOACAE         Radiology Studies: No results found.    Scheduled Meds: . amiodarone  200 mg Per Tube Daily  . apixaban  5 mg Per Tube BID  . atorvastatin  80 mg Per Tube q1800  . chlorhexidine  15 mL Mouth Rinse BID  . cyanocobalamin  1,000 mcg Intramuscular Daily  . diltiazem  30 mg Per Tube Q6H  . feeding supplement (OSMOLITE 1.5 CAL)  474 mL Per Tube TID  . feeding supplement (PRO-STAT SUGAR FREE  64)  30 mL Per Tube Q0600  . folic acid  2 mg Oral Daily  . furosemide  20 mg Oral Daily  . mouth rinse  15 mL Mouth Rinse q12n4p  . metoprolol tartrate  100 mg Per Tube BID  . QUEtiapine  100 mg Oral QHS  . QUEtiapine  75 mg Per Tube Daily  . sennosides  5 mL Per Tube BID  . valproic acid  500 mg Per Tube BID   Continuous Infusions: . sodium chloride 10 mL/hr at 02/01/19 0541  . cefTRIAXone (ROCEPHIN)  IV       LOS: 29 days      Debbe Odea, MD [facility] Pager: [email or web address] Password [facility] 02/16/2019, 10:59 AM

## 2019-02-16 NOTE — Progress Notes (Signed)
  Speech Language Pathology Treatment: Cognitive-Linquistic  Patient Details Name: [name] MRN: [id number] DOB: Sep 23, 1950 Today's Date: 02/16/2019 Time: [id number] SLP Time Calculation (min) (ACUTE ONLY): 14 min  Assessment / Plan / Recommendation Clinical Impression   Pt was lethargic today, related to medications per RN. He awakens to Min stimulation, though mostly keeps his eyes closed. Given Max cues, he followed one-step commands x3 today. His verbal expression is primarily incoherent with the exception of his perseveration of "dammit." SLP provided oral stimulus for PO trials, but pt was not sufficiently alert to try any. Even when alert, pt has been resistant to accepting POs. Will continue to follow briefly to see if he will begin to accept them for better assessment of swallow function.     HPI HPI: Pt is a 69 y/o M admitted with L MCA CVA s/p TPA. He was intubated 12/11-12/13, reintubated 12/13; trach 12/21. PMH: tobacco use, previous strokes, GERD, COPD, AAA, PVD, HTN, HLD, pulmonary nodule, depression, afib (recently missed doses of coumadin)      SLP Plan  Continue with current plan of care       Recommendations  Diet recommendations: NPO Medication Administration: Via alternative means                Oral Care Recommendations: Oral care QID Follow up Recommendations: Skilled Nursing facility SLP Visit Diagnosis: Aphasia (R47.01) Plan: Continue with current plan of care       GO                 Osie Bond., M.A. [facility] Pager [phone] Office [phone]  02/16/2019, 10:32 AM

## 2019-02-17 ENCOUNTER — Inpatient Hospital Stay (HOSPITAL_COMMUNITY): Payer: Medicare Other

## 2019-02-17 DIAGNOSIS — E871 Hypo-osmolality and hyponatremia: Secondary | ICD-10-CM

## 2019-02-17 DIAGNOSIS — R1310 Dysphagia, unspecified: Secondary | ICD-10-CM

## 2019-02-17 DIAGNOSIS — D508 Other iron deficiency anemias: Secondary | ICD-10-CM

## 2019-02-17 HISTORY — PX: IR REPLC GASTRO/COLONIC TUBE PERCUT W/FLUORO: IMG2333

## 2019-02-17 LAB — CBC WITH DIFFERENTIAL/PLATELET
Abs Immature Granulocytes: 0.12 10*3/uL — ABNORMAL HIGH (ref 0.00–0.07)
Basophils Absolute: 0 10*3/uL (ref 0.0–0.1)
Basophils Relative: 0 %
Eosinophils Absolute: 0 10*3/uL (ref 0.0–0.5)
Eosinophils Relative: 0 %
HCT: 40.7 % (ref 39.0–52.0)
Hemoglobin: 13.8 g/dL (ref 13.0–17.0)
Immature Granulocytes: 1 %
Lymphocytes Relative: 15 %
Lymphs Abs: 3.1 10*3/uL (ref 0.7–4.0)
MCH: 34.2 pg — ABNORMAL HIGH (ref 26.0–34.0)
MCHC: 33.9 g/dL (ref 30.0–36.0)
MCV: 100.7 fL — ABNORMAL HIGH (ref 80.0–100.0)
Monocytes Absolute: 1.2 10*3/uL — ABNORMAL HIGH (ref 0.1–1.0)
Monocytes Relative: 6 %
Neutro Abs: 15.5 10*3/uL — ABNORMAL HIGH (ref 1.7–7.7)
Neutrophils Relative %: 78 %
Platelets: 304 10*3/uL (ref 150–400)
RBC: 4.04 MIL/uL — ABNORMAL LOW (ref 4.22–5.81)
RDW: 14.6 % (ref 11.5–15.5)
WBC: 20 10*3/uL — ABNORMAL HIGH (ref 4.0–10.5)
nRBC: 0 % (ref 0.0–0.2)

## 2019-02-17 LAB — BASIC METABOLIC PANEL
Anion gap: 12 (ref 5–15)
BUN: 21 mg/dL (ref 8–23)
CO2: 26 mmol/L (ref 22–32)
Calcium: 9.5 mg/dL (ref 8.9–10.3)
Chloride: 93 mmol/L — ABNORMAL LOW (ref 98–111)
Creatinine, Ser: 0.78 mg/dL (ref 0.61–1.24)
GFR calc Af Amer: >60 mL/min (ref >60)
GFR calc non Af Amer: >60 mL/min (ref >60)
Glucose, Bld: 98 mg/dL (ref 70–99)
Potassium: 4.2 mmol/L (ref 3.5–5.1)
Sodium: 131 mmol/L — ABNORMAL LOW (ref 135–145)

## 2019-02-17 MED ORDER — LORAZEPAM 2 MG/ML IJ SOLN
0.5000 mg | Freq: Once | INTRAMUSCULAR | Status: AC
  Administered 2019-02-17: 0.5 mg via INTRAVENOUS
  Filled 2019-02-17: qty 1

## 2019-02-17 MED ORDER — METOPROLOL TARTRATE 5 MG/5ML IV SOLN: 5.0000 mg | INTRAVENOUS | Status: DC | PRN

## 2019-02-17 MED ORDER — HALOPERIDOL LACTATE 5 MG/ML IJ SOLN: 2.0000 mg | Freq: Four times a day (QID) | INTRAMUSCULAR | Status: DC | PRN

## 2019-02-17 MED ORDER — LIDOCAINE VISCOUS HCL 2 % MT SOLN
OROMUCOSAL | Status: AC
  Filled 2019-02-17: qty 15

## 2019-02-17 MED ORDER — DILTIAZEM 12 MG/ML ORAL SUSPENSION
30.0000 mg | Freq: Once | ORAL | Status: AC
  Administered 2019-02-17: 30 mg via ORAL
  Filled 2019-02-17: qty 3

## 2019-02-17 MED ORDER — DILTIAZEM HCL-DEXTROSE 125-5 MG/125ML-% IV SOLN (PREMIX)
5.0000 mg/h | INTRAVENOUS | Status: DC
  Administered 2019-02-17: 10 mg/h via INTRAVENOUS
  Filled 2019-02-17: qty 125

## 2019-02-17 MED ORDER — IOHEXOL 300 MG/ML  SOLN
50.0000 mL | Freq: Once | INTRAMUSCULAR | Status: AC | PRN
  Administered 2019-02-17: 25 mL

## 2019-02-17 MED ORDER — VALPROATE SODIUM 500 MG/5ML IV SOLN
500.0000 mg | Freq: Two times a day (BID) | INTRAVENOUS | Status: DC
  Administered 2019-02-17 – 2019-02-18 (×4): 500 mg via INTRAVENOUS
  Filled 2019-02-17 (×6): qty 5

## 2019-02-17 NOTE — Procedures (Signed)
Interventional Radiology Procedure:   Indications: Dislodged gastrostomy tube  Procedure: Gastrostomy tube exchange  Findings: New 18 Fr balloon retention tube in stomach  Complications: None     EBL: None  Plan: Gastrostomy tube is ready to be used.    Mariann Palo R. Anselm Pancoast, MD  Pager: [phone]

## 2019-02-17 NOTE — Progress Notes (Signed)
Upon entering the room at 0430, RN noted that the patient had removed his PEG tube despite having on mittens, bilateral wrist restraints, and a posey belt. A foley catheter has been placed into the hole to keep it patent. On-call provider notified. Patient agitated, but stable. No new orders.

## 2019-02-17 NOTE — Progress Notes (Signed)
PROGRESS NOTE    [name]   [id number]  DOB: 10/07/1950  DOA: 01/18/2019 PCP: Shon Baton, MD   Brief Narrative:  [name] 69 yo male smoker presented with HTN emergency and acute Lt MCA CVA s/p thrombolytic therapy, and A fib with RVR.    He required intubation for airway protection.  Sputum culture grew out streptococcal pneumonia and Enterobacter cloacae and he was treated with antibiotics. He had difficulty weaning from vent and required tracheostomy and PEG tube.  12/10 tPA administered 12/11 intubated 12/21 Tracheostomy 12/22 PEG placed 01/04 patient pulled out trach >> left out  Subjective:  Restless this AM. PEG tube pulled out last night.     Assessment & Plan:   Principal Problem:  Pulled PEG - I spoke with Gen surgery who placed it and have been told that IR will need to replaced it- I place an order to replace by IR - try to change meds to IV where possible today - hold Lasix as he will not get tube feeds today - add Haldol for agitation today  Active Problems:  Left embolic stroke-history of CVA -With left gaze, right hemiparesis, right neglect and acute confusion -Treated with TPA 06/18/2018 -CT head in ED-no acute findings -CT head 01/19/2019: Interval demarcation of acute cortically based infarct within the left frontal lobe - CTA head and neck- Prominent soft plaque at the left ICA bulb. 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  -MRI 12/12: Multifocal acute ischemia within left MCA territory including the left postcentral gyrus-no hemorrhage or mass-effect-old right occipital and cerebellar infarcts-chronic ischemic microangiopathy -2D echo 12/11: EF 55 to 60%-no thrombus or embolus noted -LDL 182- Lipitor increased from 40 to 80 mg daily -Hemoglobin A1c 5.1 -Initially treated with heparin-he was on warfarin prior to admission and INR was 1.1 on admission -he is now  on Eliquis since 12/26  A. fib with RVR -Started on amiodarone and Cardizem infusions -Currently on metoprolol 100 mg twice daily, amiodarone 200 mg daily and Cardizem,short acting 30 mg every 6 hours  Delirium-acute metabolic encephalopathy -Vitamin B12 level was 161-supplements have been started -Continues to be quite confused-receiving Seroquel and Depakote -1/7>  I will increase Seroquel from 37.5 in AM to 75 today as RN states that he remains confused and very agitated during the day. Will be increasing bedtime dose as well but will need to see how he responds to the increased AM dose before deciding on a PM dose. - 1/8> note from RN yesterday states the patient fell asleep around 4 PM- today, he is asleep with fevers and tachycardia- see below- hold sedative today.  Fever/ tachycardia/ leukocytosis 1/8 - recheck blood cultures, UA, Urine culture and CXR - CXR negative- likely due to UTI - see below - resumed Ceftriaxone 1/8- fevers resolved - the medication "fell off " the [facility]- d/w pharmacy  Enterobacter UTI 1/2 - Cont ceftriaxone  Acute respiratory failure-hypoxic -Initially intubated for airway protection secondary to acute CVA -The patient also developed pneumonia with cultures growing out strep pneumonia and Enterobacter cloacae and was treated with cefepime -Underwent tracheostomy on 12/21 -He pulled his trach out on 1/4  Dysphagia severe protein calorie malnutrition -He continues to have a PEG tube -Continue Osmolite 474 mL 3 times daily -prostat 30 cc daily - refusing to eat/drink thus I will cont all food and meds via tube  Iron deficiency anemia with macrocytosis -Feraheme given on 12/29 -  Macrocytosis likely due to B12 deficiency and ETOH abuse  Thrombocytosis -started on Hydrea by Dr. Marin Olp on 12/28-JAK2 assay was negative -Platelet count improving after IV iron given therefore was secondary to IDA- Hydrea d/c'd 1/7    Tobacco abuse    Time spent in  minutes: 40  DVT prophylaxis: Eliquis Code Status: Full code Family Communication:  Disposition Plan: SNF Consultants:   PCCM admitted patient  Neurology  Hematology  Palliative care  General surgery Procedures:  Transthoracic Echocardiogram  01/19/2019 IMPRESSIONS 1. Left ventricular ejection fraction, by visual estimation, is 55 to 60%. The left ventricle has normal function. There is moderately increased left ventricular hypertrophy. 2. The left ventricle demonstrates regional wall motion abnormalities. 3. Global right ventricle has normal systolic function.The right ventricular size is normal. No increase in right ventricular wall thickness. 4. Left atrial size was normal. 5. Right atrial size was mildly dilated. 6. The mitral valve is normal in structure. Mild to moderate mitral valve regurgitation. 7. The tricuspid valve is normal in structure. Tricuspid valve regurgitation is trivial. 8. The aortic valve is normal in structure. Aortic valve regurgitation is not visualized. 9. The pulmonic valve was normal in structure. Pulmonic valve regurgitation is not visualized. 10. The atrial septum is grossly normal.  Antimicrobials:  Anti-infectives (From admission, onward)   Start     Dose/Rate Route Frequency Ordered Stop   02/16/19 1045  cefTRIAXone (ROCEPHIN) 1 g in sodium chloride 0.9 % 100 mL IVPB     1 g 200 mL/hr over 30 Minutes Intravenous Every 24 hours 02/16/19 1042     02/12/19 1800  cefTRIAXone (ROCEPHIN) 1 g in sodium chloride 0.9 % 100 mL IVPB     1 g 200 mL/hr over 30 Minutes Intravenous  Once 02/12/19 1348 02/13/19 0412   02/10/19 1430  cefTRIAXone (ROCEPHIN) 1 g in sodium chloride 0.9 % 100 mL IVPB     1 g 200 mL/hr over 30 Minutes Intravenous Every 24 hours 02/10/19 1347 02/11/19 1754   01/26/19 1000  ceFEPIme (MAXIPIME) 2 g in sodium chloride 0.9 % 100 mL IVPB     2 g 200 mL/hr over 30 Minutes Intravenous Every 8 hours 01/26/19 0955 02/02/19 0145    01/25/19 1000  Ampicillin-Sulbactam (UNASYN) 3 g in sodium chloride 0.9 % 100 mL IVPB  Status:  Discontinued     3 g 200 mL/hr over 30 Minutes Intravenous Every 8 hours 01/25/19 0810 01/26/19 0955       Objective: Vitals:   02/16/19 2354 02/17/19 0327 02/17/19 0500 02/17/19 0830  BP: (!) 143/82 128/84  127/87  Pulse: 84 70  69  Resp: 20 16  (!) 22  Temp: 97.9 F (36.6 C) 98.9 F (37.2 C)  98.7 F (37.1 C)  TempSrc: Oral Axillary  Axillary  SpO2: 100% 99%  93%  Weight:   68 kg   Height:        Intake/Output Summary (Last 24 hours) at 02/17/2019 1017 Last data filed at 02/17/2019 0327 Gross per 24 hour  Intake --  Output 1500 ml  Net -1500 ml   Filed Weights   02/14/19 0500 02/15/19 0500 02/17/19 0500  Weight: 60.5 kg 60.5 kg 68 kg    Examination: General exam: Appears comfortable- asleep   HEENT: PERRLA, oral mucosa moist, no sclera icterus or thrush- tracheotomy scar healing Respiratory system: Clear to auscultation. Respiratory effort normal. Cardiovascular system: S1 & S2 heard,  No murmurs - tachycardia Gastrointestinal system: Abdomen soft, non-tender, nondistended. Normal  bowel sounds  - PEG tube present Central nervous system: asleep Extremities: No cyanosis, clubbing or edema Skin: No rashes or ulcers    Data Reviewed: I have personally reviewed following labs and imaging studies  CBC: Recent Labs  Lab 02/12/19 0223 02/14/19 0842 02/15/19 0519 02/16/19 0905 02/17/19 0649  WBC 15.0* 13.3* 8.3 17.4* 20.0*  NEUTROABS  --  9.9* 4.7 15.0* 15.5*  HGB 13.3 14.4 13.7 14.1 13.8  HCT 39.5 43.2 41.5 41.9 40.7  MCV 102.3* 102.9* 102.2* 101.2* 100.7*  PLT 522* 412* 380 342 [id number]   Basic Metabolic Panel: Recent Labs  Lab 02/16/19 0905 02/17/19 0649  NA 135 131*  K 4.4 4.2  CL 96* 93*  CO2 26 26  GLUCOSE 88 98  BUN 23 21  CREATININE 0.87 0.78  CALCIUM 9.9 9.5   GFR: Estimated Creatinine Clearance: 83.8 mL/min (by C-G formula based on SCr of 0.78  mg/dL). Liver Function Tests: No results for input(s): AST, ALT, ALKPHOS, BILITOT, PROT, ALBUMIN in the last 168 hours. No results for input(s): LIPASE, AMYLASE in the last 168 hours. No results for input(s): AMMONIA in the last 168 hours. Coagulation Profile: No results for input(s): INR, PROTIME in the last 168 hours. Cardiac Enzymes: No results for input(s): CKTOTAL, CKMB, CKMBINDEX, TROPONINI in the last 168 hours. BNP (last 3 results) No results for input(s): PROBNP in the last 8760 hours. HbA1C: No results for input(s): HGBA1C in the last 72 hours. CBG: Recent Labs  Lab 02/14/19 1138 02/14/19 1548 02/14/19 2029 02/14/19 2350 02/15/19 0438  GLUCAP 135* 76 111* 125* 83   Lipid Profile: No results for input(s): CHOL, HDL, LDLCALC, TRIG, CHOLHDL, LDLDIRECT in the last 72 hours. Thyroid Function Tests: No results for input(s): TSH, T4TOTAL, FREET4, T3FREE, THYROIDAB in the last 72 hours. Anemia Panel: No results for input(s): VITAMINB12, FOLATE, FERRITIN, TIBC, IRON, RETICCTPCT in the last 72 hours. Urine analysis:    Component Value Date/Time   COLORURINE YELLOW 02/16/2019 1345   APPEARANCEUR HAZY (A) 02/16/2019 1345   LABSPEC 1.012 02/16/2019 1345   PHURINE 7.0 02/16/2019 1345   GLUCOSEU NEGATIVE 02/16/2019 1345   GLUCOSEU NEGATIVE 12/13/2012 1514   HGBUR NEGATIVE 02/16/2019 1345   [place]orse 02/16/2019 1345   KETONESUR NEGATIVE 02/16/2019 1345   PROTEINUR NEGATIVE 02/16/2019 1345   UROBILINOGEN 0.2 12/13/2012 1514   NITRITE POSITIVE (A) 02/16/2019 1345   LEUKOCYTESUR LARGE (A) 02/16/2019 1345   Sepsis Labs: @LABRCNTIP (procalcitonin:4,lacticidven:4) ) Recent Results (from the past 240 hour(s))  Urine Culture     Status: Abnormal   Collection Time: 02/10/19  6:10 PM   Specimen: Urine, Catheterized  Result Value Ref Range Status   Specimen Description URINE, CATHETERIZED  Final   Special Requests   Final    URINE, CLEAN CATCH Performed at [facility], [place] [address], [place], [place] [address]    Culture >=100,000 COLONIES/mL ENTEROBACTER CLOACAE (A)  Final   Report Status 02/13/2019 FINAL  Final   Organism ID, Bacteria ENTEROBACTER CLOACAE (A)  Final      Susceptibility   Enterobacter cloacae - MIC*    CEFAZOLIN >=64 RESISTANT Resistant     CIPROFLOXACIN <=0.25 SENSITIVE Sensitive     GENTAMICIN <=1 SENSITIVE Sensitive     IMIPENEM <=0.25 SENSITIVE Sensitive     NITROFURANTOIN <=16 SENSITIVE Sensitive     TRIMETH/SULFA <=20 SENSITIVE Sensitive     PIP/TAZO >=128 RESISTANT Resistant     * >=100,000 COLONIES/mL ENTEROBACTER CLOACAE  Culture, blood (routine  x 2)     Status: None (Preliminary result)   Collection Time: 02/16/19 12:00 PM   Specimen: BLOOD  Result Value Ref Range Status   Specimen Description BLOOD RIGHT ANTECUBITAL  Final   Special Requests   Final    BOTTLES DRAWN AEROBIC ONLY Blood Culture adequate volume   Culture   Final    NO GROWTH < 24 HOURS Performed at [facility], [address] [place], [place] [address]    Report Status PENDING  Incomplete  Culture, blood (routine x 2)     Status: None (Preliminary result)   Collection Time: 02/16/19 12:26 PM   Specimen: BLOOD RIGHT ARM  Result Value Ref Range Status   Specimen Description BLOOD RIGHT ARM  Final   Special Requests   Final    BOTTLES DRAWN AEROBIC ONLY Blood Culture adequate volume   Culture   Final    NO GROWTH < 24 HOURS Performed at [facility], [place] [address] [place], [place] [address]    Report Status PENDING  Incomplete         Radiology Studies: DG CHEST PORT 1 VIEW  Result Date: 02/16/2019 CLINICAL DATA:  69 year old male with a history of fever EXAM: PORTABLE CHEST 1 VIEW COMPARISON:  02/09/2019 FINDINGS: Cardiomediastinal silhouette unchanged in size and contour. No pneumothorax. No pleural effusion. Improved aeration compared to the prior plain film. No interlobular septal thickening. No displaced fracture  IMPRESSION: Improved aeration compared to the prior with no evidence of acute cardiopulmonary disease Electronically Signed   By: Corrie Mckusick D.O.   On: 02/16/2019 12:37      Scheduled Meds: . amiodarone  200 mg Per Tube Daily  . apixaban  5 mg Per Tube BID  . atorvastatin  80 mg Per Tube q1800  . chlorhexidine  15 mL Mouth Rinse BID  . diltiazem  30 mg Per Tube Q6H  . feeding supplement (OSMOLITE 1.5 CAL)  474 mL Per Tube TID  . feeding supplement (PRO-STAT SUGAR FREE 64)  30 mL Per Tube Q0600  . folic acid  2 mg Oral Daily  . mouth rinse  15 mL Mouth Rinse q12n4p  . metoprolol tartrate  100 mg Per Tube BID  . QUEtiapine  75 mg Per Tube Daily  . sennosides  5 mL Per Tube BID  . valproic acid  500 mg Per Tube BID  . vitamin B-12  1,000 mcg Per Tube Daily   Continuous Infusions: . sodium chloride 10 mL/hr at 02/01/19 0541  . cefTRIAXone (ROCEPHIN)  IV 1 g (02/16/19 1337)  . valproate sodium       LOS: 30 days      Debbe Odea, MD [facility] Pager: [email or web address] Password [facility] 02/17/2019, 10:17 AM

## 2019-02-17 NOTE — Progress Notes (Signed)
Spoke with on-call MD to convert pt back to PO Cardizem per tube. Dose given now, stop Cardizem in one hour, then resume regular dosing schedule. MD also gave order to reschedule morning Amiodarone dose to 2200 x1. On coming staff made aware.

## 2019-02-18 LAB — CBC WITH DIFFERENTIAL/PLATELET
Abs Immature Granulocytes: 0.06 10*3/uL (ref 0.00–0.07)
Basophils Absolute: 0 10*3/uL (ref 0.0–0.1)
Basophils Relative: 0 %
Eosinophils Absolute: 0 10*3/uL (ref 0.0–0.5)
Eosinophils Relative: 0 %
HCT: 41.7 % (ref 39.0–52.0)
Hemoglobin: 13.8 g/dL (ref 13.0–17.0)
Immature Granulocytes: 0 %
Lymphocytes Relative: 15 %
Lymphs Abs: 2.2 10*3/uL (ref 0.7–4.0)
MCH: 33.6 pg (ref 26.0–34.0)
MCHC: 33.1 g/dL (ref 30.0–36.0)
MCV: 101.5 fL — ABNORMAL HIGH (ref 80.0–100.0)
Monocytes Absolute: 1 10*3/uL (ref 0.1–1.0)
Monocytes Relative: 6 %
Neutro Abs: 12 10*3/uL — ABNORMAL HIGH (ref 1.7–7.7)
Neutrophils Relative %: 79 %
Platelets: 267 10*3/uL (ref 150–400)
RBC: 4.11 MIL/uL — ABNORMAL LOW (ref 4.22–5.81)
RDW: 14.6 % (ref 11.5–15.5)
WBC: 15.3 10*3/uL — ABNORMAL HIGH (ref 4.0–10.5)
nRBC: 0 % (ref 0.0–0.2)

## 2019-02-18 LAB — BASIC METABOLIC PANEL
Anion gap: 12 (ref 5–15)
BUN: 16 mg/dL (ref 8–23)
CO2: 25 mmol/L (ref 22–32)
Calcium: 9.3 mg/dL (ref 8.9–10.3)
Chloride: 96 mmol/L — ABNORMAL LOW (ref 98–111)
Creatinine, Ser: 0.76 mg/dL (ref 0.61–1.24)
GFR calc Af Amer: >60 mL/min (ref >60)
GFR calc non Af Amer: >60 mL/min (ref >60)
Glucose, Bld: 134 mg/dL — ABNORMAL HIGH (ref 70–99)
Potassium: 3.9 mmol/L (ref 3.5–5.1)
Sodium: 133 mmol/L — ABNORMAL LOW (ref 135–145)

## 2019-02-18 LAB — VALPROIC ACID LEVEL: Valproic Acid Lvl: 58 ug/mL (ref 50.0–100.0)

## 2019-02-18 MED ORDER — QUETIAPINE FUMARATE 50 MG PO TABS
50.0000 mg | ORAL_TABLET | Freq: Every day | ORAL | Status: DC
  Administered 2019-02-18 – 2019-03-02 (×13): 50 mg
  Filled 2019-02-18 (×13): qty 1

## 2019-02-18 NOTE — Progress Notes (Signed)
Report from Night RN and NT: Pt did not sleep at all with admin of Depacon only. PRN Seroquel given around midnight and pt slept for roughly 3 hrs.

## 2019-02-18 NOTE — Progress Notes (Signed)
PROGRESS NOTE    [name]   [id number]  DOB: 1950/06/29  DOA: 01/18/2019 PCP: Shon Baton, MD   Brief Narrative:  [name] 69 yo male smoker presented with HTN emergency and acute Lt MCA CVA s/p thrombolytic therapy, and A fib with RVR.    He required intubation for airway protection.  Sputum culture grew out streptococcal pneumonia and Enterobacter cloacae and he was treated with antibiotics. He had difficulty weaning from vent and required tracheostomy and PEG tube.  12/10 tPA administered 12/11 intubated 12/21 Tracheostomy 12/22 PEG placed 01/04 patient pulled out trach >> left out  Subjective: Non-verbal.    Assessment & Plan:   Principal Problem:  Pulled PEG - replaced 1/9    Active Problems:  Left embolic stroke-history of CVA -With left gaze, right hemiparesis, right neglect and acute confusion -Treated with TPA 06/18/2018 -CT head in ED-no acute findings -CT head 01/19/2019: Interval demarcation of acute cortically based infarct within the left frontal lobe - CTA head and neck- Prominent soft plaque at the left ICA bulb. 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  -MRI 12/12: Multifocal acute ischemia within left MCA territory including the left postcentral gyrus-no hemorrhage or mass-effect-old right occipital and cerebellar infarcts-chronic ischemic microangiopathy -2D echo 12/11: EF 55 to 60%-no thrombus or embolus noted -LDL 182- Lipitor increased from 40 to 80 mg daily -Hemoglobin A1c 5.1 -Initially treated with heparin-he was on warfarin prior to admission and INR was 1.1 on admission -he is now on Eliquis since 12/26  A. fib with RVR -Started on Amiodarone and Cardizem infusions -Currently on metoprolol 100 mg twice daily, amiodarone 200 mg daily and Cardizem,short acting 30 mg every 6 hours  Delirium-acute metabolic encephalopathy -Vitamin B12 level was  161-supplements have been started -Continues to be quite confused-receiving Seroquel and Depakote - Depakote increased to 500 BID- Seroquel > 100 at bedtime helped last night- will give 50 this AM and follow- checked Depakote level today- it is 58 thus we can go up on the dose if needed  Fever/ tachycardia/ leukocytosis 1/8 Enterobacter UTI 1/2 - recheck blood cultures, UA, Urine culture and CXR - CXR negative- likely due to UTI - see below - resumed Ceftriaxone 1/8- fevers resolved- the medication "fell off " the [facility]- d/w pharmacy - sepsis picture resolved- U culture still growing >=100,000 COLONIES/mL ENTEROBACTER CLOACAE - Cont ceftriaxone- reset schedule to day 1 being 1/8  Acute respiratory failure-hypoxic -Initially intubated for airway protection secondary to acute CVA -The patient also developed pneumonia with cultures growing out strep pneumonia and Enterobacter cloacae and was treated with cefepime -Underwent tracheostomy on 12/21 -He pulled his trach out on 1/4  Dysphagia severe protein calorie malnutrition -He continues to have a PEG tube -Continue Osmolite 474 mL 3 times daily -prostat 30 cc daily - refusing to eat/drink thus I will cont all food and meds via tube  B12 and Iron deficiency anemia with macrocytosis -Feraheme given on 12/29 - Macrocytosis likely due to B12 deficiency and ETOH abuse  Thrombocytosis -started on Hydrea by Dr. Marin Olp on 12/28-JAK2 assay was negative -Platelet count improving after IV iron given therefore was secondary to IDA- Hydrea d/c'd 1/7    Tobacco abuse    Time spent in minutes: 40  DVT prophylaxis: Eliquis Code Status: Full code Family Communication:  Disposition Plan: SNF Consultants:   PCCM admitted patient  Neurology  Hematology  Palliative care  General surgery Procedures:  Transthoracic Echocardiogram  01/19/2019 IMPRESSIONS 1. Left ventricular ejection fraction, by visual estimation, is 55 to 60%. The left  ventricle has normal function. There is moderately increased left ventricular hypertrophy. 2. The left ventricle demonstrates regional wall motion abnormalities. 3. Global right ventricle has normal systolic function.The right ventricular size is normal. No increase in right ventricular wall thickness. 4. Left atrial size was normal. 5. Right atrial size was mildly dilated. 6. The mitral valve is normal in structure. Mild to moderate mitral valve regurgitation. 7. The tricuspid valve is normal in structure. Tricuspid valve regurgitation is trivial. 8. The aortic valve is normal in structure. Aortic valve regurgitation is not visualized. 9. The pulmonic valve was normal in structure. Pulmonic valve regurgitation is not visualized. 10. The atrial septum is grossly normal.  Antimicrobials:  Anti-infectives (From admission, onward)   Start     Dose/Rate Route Frequency Ordered Stop   02/16/19 1045  cefTRIAXone (ROCEPHIN) 1 g in sodium chloride 0.9 % 100 mL IVPB     1 g 200 mL/hr over 30 Minutes Intravenous Every 24 hours 02/16/19 1042     02/12/19 1800  cefTRIAXone (ROCEPHIN) 1 g in sodium chloride 0.9 % 100 mL IVPB     1 g 200 mL/hr over 30 Minutes Intravenous  Once 02/12/19 1348 02/13/19 0412   02/10/19 1430  cefTRIAXone (ROCEPHIN) 1 g in sodium chloride 0.9 % 100 mL IVPB     1 g 200 mL/hr over 30 Minutes Intravenous Every 24 hours 02/10/19 1347 02/11/19 1754   01/26/19 1000  ceFEPIme (MAXIPIME) 2 g in sodium chloride 0.9 % 100 mL IVPB     2 g 200 mL/hr over 30 Minutes Intravenous Every 8 hours 01/26/19 0955 02/02/19 0145   01/25/19 1000  Ampicillin-Sulbactam (UNASYN) 3 g in sodium chloride 0.9 % 100 mL IVPB  Status:  Discontinued     3 g 200 mL/hr over 30 Minutes Intravenous Every 8 hours 01/25/19 0810 01/26/19 0955       Objective: Vitals:   02/18/19 0428 02/18/19 0500 02/18/19 0746 02/18/19 0818  BP: 134/78  129/89 118/75  Pulse: 76  (!) 105 63  Resp: 14  17 18   Temp:  98 F (36.7 C)  98.1 F (36.7 C) 98.4 F (36.9 C)  TempSrc: Axillary  Axillary   SpO2: 100%  100% 100%  Weight:  67.7 kg    Height:        Intake/Output Summary (Last 24 hours) at 02/18/2019 1205 Last data filed at 02/18/2019 0432 Gross per 24 hour  Intake --  Output 1850 ml  Net -1850 ml   Filed Weights   02/15/19 0500 02/17/19 0500 02/18/19 0500  Weight: 60.5 kg 68 kg 67.7 kg    Examination: General exam: Appears comfortable  HEENT: PERRLA, oral mucosa moist, no sclera icterus or thrush Respiratory system: Clear to auscultation. Respiratory effort normal. Cardiovascular system: S1 & S2 heard,  No murmurs  Gastrointestinal system: Abdomen soft, non-tender, nondistended. Normal bowel sounds   Central nervous system: Alert - confused Extremities: No cyanosis, clubbing or edema Skin: No rashes or ulcers    Data Reviewed: I have personally reviewed following labs and imaging studies  CBC: Recent Labs  Lab 02/14/19 0842 02/15/19 0519 02/16/19 0905 02/17/19 0649 02/18/19 0116  WBC 13.3* 8.3 17.4* 20.0* 15.3*  NEUTROABS 9.9* 4.7 15.0* 15.5* 12.0*  HGB 14.4 13.7 14.1 13.8 13.8  HCT 43.2 41.5 41.9 40.7 41.7  MCV 102.9* 102.2* 101.2* 100.7* 101.5*  PLT 412*  380 342 304 [id number]   Basic Metabolic Panel: Recent Labs  Lab 02/16/19 0905 02/17/19 0649 02/18/19 0116  NA 135 131* 133*  K 4.4 4.2 3.9  CL 96* 93* 96*  CO2 26 26 25   GLUCOSE 88 98 134*  BUN 23 21 16   CREATININE 0.87 0.78 0.76  CALCIUM 9.9 9.5 9.3   GFR: Estimated Creatinine Clearance: 83.4 mL/min (by C-G formula based on SCr of 0.76 mg/dL). Liver Function Tests: No results for input(s): AST, ALT, ALKPHOS, BILITOT, PROT, ALBUMIN in the last 168 hours. No results for input(s): LIPASE, AMYLASE in the last 168 hours. No results for input(s): AMMONIA in the last 168 hours. Coagulation Profile: No results for input(s): INR, PROTIME in the last 168 hours. Cardiac Enzymes: No results for input(s): CKTOTAL,  CKMB, CKMBINDEX, TROPONINI in the last 168 hours. BNP (last 3 results) No results for input(s): PROBNP in the last 8760 hours. HbA1C: No results for input(s): HGBA1C in the last 72 hours. CBG: Recent Labs  Lab 02/14/19 1138 02/14/19 1548 02/14/19 2029 02/14/19 2350 02/15/19 0438  GLUCAP 135* 76 111* 125* 83   Lipid Profile: No results for input(s): CHOL, HDL, LDLCALC, TRIG, CHOLHDL, LDLDIRECT in the last 72 hours. Thyroid Function Tests: No results for input(s): TSH, T4TOTAL, FREET4, T3FREE, THYROIDAB in the last 72 hours. Anemia Panel: No results for input(s): VITAMINB12, FOLATE, FERRITIN, TIBC, IRON, RETICCTPCT in the last 72 hours. Urine analysis:    Component Value Date/Time   COLORURINE YELLOW 02/16/2019 1345   APPEARANCEUR HAZY (A) 02/16/2019 1345   LABSPEC 1.012 02/16/2019 1345   PHURINE 7.0 02/16/2019 1345   GLUCOSEU NEGATIVE 02/16/2019 1345   GLUCOSEU NEGATIVE 12/13/2012 1514   HGBUR NEGATIVE 02/16/2019 1345   [place] 02/16/2019 1345   KETONESUR NEGATIVE 02/16/2019 1345   PROTEINUR NEGATIVE 02/16/2019 1345   UROBILINOGEN 0.2 12/13/2012 1514   NITRITE POSITIVE (A) 02/16/2019 1345   LEUKOCYTESUR LARGE (A) 02/16/2019 1345   Sepsis Labs: @LABRCNTIP (procalcitonin:4,lacticidven:4) ) Recent Results (from the past 240 hour(s))  Urine Culture     Status: Abnormal   Collection Time: 02/10/19  6:10 PM   Specimen: Urine, Catheterized  Result Value Ref Range Status   Specimen Description URINE, CATHETERIZED  Final   Special Requests   Final    URINE, CLEAN CATCH Performed at [facility], [place] [address], [place], [place] [address]    Culture >=100,000 COLONIES/mL ENTEROBACTER CLOACAE (A)  Final   Report Status 02/13/2019 FINAL  Final   Organism ID, Bacteria ENTEROBACTER CLOACAE (A)  Final      Susceptibility   Enterobacter cloacae - MIC*    CEFAZOLIN >=64 RESISTANT Resistant     CIPROFLOXACIN <=0.25 SENSITIVE Sensitive     GENTAMICIN <=1  SENSITIVE Sensitive     IMIPENEM <=0.25 SENSITIVE Sensitive     NITROFURANTOIN <=16 SENSITIVE Sensitive     TRIMETH/SULFA <=20 SENSITIVE Sensitive     PIP/TAZO >=128 RESISTANT Resistant     * >=100,000 COLONIES/mL ENTEROBACTER CLOACAE  Culture, blood (routine x 2)     Status: None (Preliminary result)   Collection Time: 02/16/19 12:00 PM   Specimen: BLOOD  Result Value Ref Range Status   Specimen Description BLOOD RIGHT ANTECUBITAL  Final   Special Requests   Final    BOTTLES DRAWN AEROBIC ONLY Blood Culture adequate volume   Culture   Final    NO GROWTH 2 DAYS Performed at [facility], [place] [address] [place], [place] [address]  Report Status PENDING  Incomplete  Culture, blood (routine x 2)     Status: None (Preliminary result)   Collection Time: 02/16/19 12:26 PM   Specimen: BLOOD RIGHT ARM  Result Value Ref Range Status   Specimen Description BLOOD RIGHT ARM  Final   Special Requests   Final    BOTTLES DRAWN AEROBIC ONLY Blood Culture adequate volume   Culture   Final    NO GROWTH 2 DAYS Performed at [facility], [address] [place], [place] [address]    Report Status PENDING  Incomplete  Culture, Urine     Status: Abnormal (Preliminary result)   Collection Time: 02/16/19  1:20 PM   Specimen: Urine, Catheterized  Result Value Ref Range Status   Specimen Description URINE, CATHETERIZED  Final   Special Requests NONE  Final   Culture (A)  Final    >=100,000 COLONIES/mL ENTEROBACTER CLOACAE CULTURE REINCUBATED FOR BETTER GROWTH Performed at [facility], [place] [address] [place], [place] [address]    Report Status PENDING  Incomplete   Organism ID, Bacteria ENTEROBACTER CLOACAE (A)  Final      Susceptibility   Enterobacter cloacae - MIC*    CEFAZOLIN >=64 RESISTANT Resistant     CIPROFLOXACIN <=0.25 SENSITIVE Sensitive     GENTAMICIN <=1 SENSITIVE Sensitive     IMIPENEM <=0.25 SENSITIVE Sensitive     NITROFURANTOIN <=16 SENSITIVE Sensitive      TRIMETH/SULFA <=20 SENSITIVE Sensitive     PIP/TAZO >=128 RESISTANT Resistant     * >=100,000 COLONIES/mL ENTEROBACTER CLOACAE         Radiology Studies: IR Replc Gastro/Colonic Tube Percut W/Fluoro  Result Date: 02/17/2019 INDICATION: 69 year old with a dislodged gastrostomy tube. Foley catheter has been placed to keep tract intact. EXAM: GASTROSTOMY TUBE REPLACEMENT WITH FLUOROSCOPY MEDICATIONS: None ANESTHESIA/SEDATION: None CONTRAST:  25 mL Omnipaque 300-administered into the gastric lumen. FLUOROSCOPY TIME:  Fluoroscopy Time: 30 seconds, 3 mGy COMPLICATIONS: None immediate. PROCEDURE: Foley catheter was within the gastrostomy tube tract. The Foley catheter and surrounding skin was prepped and draped in sterile fashion. Contrast injection confirmed placement in the stomach. Foley catheter balloon deflated and tube removed. An 40 French balloon retention gastrostomy tube was easily advanced into the stomach. Balloon was inflated with approximately 8 mL of saline. Contrast injection confirmed placement in the stomach. Fluoroscopic images were taken and saved for this procedure. IMPRESSION: Successful replacement of the gastrostomy tube. Patient now has an 52 French balloon retention gastrostomy tube. Electronically Signed   By: Markus Daft M.D.   On: 02/17/2019 18:19   DG CHEST PORT 1 VIEW  Result Date: 02/16/2019 CLINICAL DATA:  69 year old male with a history of fever EXAM: PORTABLE CHEST 1 VIEW COMPARISON:  02/09/2019 FINDINGS: Cardiomediastinal silhouette unchanged in size and contour. No pneumothorax. No pleural effusion. Improved aeration compared to the prior plain film. No interlobular septal thickening. No displaced fracture IMPRESSION: Improved aeration compared to the prior with no evidence of acute cardiopulmonary disease Electronically Signed   By: Corrie Mckusick D.O.   On: 02/16/2019 12:37      Scheduled Meds: . amiodarone  200 mg Per Tube Daily  . apixaban  5 mg Per Tube BID   . atorvastatin  80 mg Per Tube q1800  . chlorhexidine  15 mL Mouth Rinse BID  . diltiazem  30 mg Per Tube Q6H  . feeding supplement (OSMOLITE 1.5 CAL)  474 mL Per Tube TID  . feeding supplement (PRO-STAT SUGAR FREE  64)  30 mL Per Tube Q0600  . folic acid  2 mg Oral Daily  . mouth rinse  15 mL Mouth Rinse q12n4p  . metoprolol tartrate  100 mg Per Tube BID  . QUEtiapine  50 mg Per Tube Daily  . sennosides  5 mL Per Tube BID  . vitamin B-12  1,000 mcg Per Tube Daily   Continuous Infusions: . sodium chloride 10 mL/hr at 02/01/19 0541  . cefTRIAXone (ROCEPHIN)  IV 1 g (02/17/19 1438)  . valproate sodium 500 mg (02/18/19 0932)     LOS: 31 days      Debbe Odea, MD [facility] Pager: [email or web address] Password [facility] 02/18/2019, 12:05 PM

## 2019-02-19 LAB — URINE CULTURE: Culture: >=100000 — AB

## 2019-02-19 LAB — CBC WITH DIFFERENTIAL/PLATELET
Abs Immature Granulocytes: 0.08 10*3/uL — ABNORMAL HIGH (ref 0.00–0.07)
Basophils Absolute: 0 10*3/uL (ref 0.0–0.1)
Basophils Relative: 0 %
Eosinophils Absolute: 0.1 10*3/uL (ref 0.0–0.5)
Eosinophils Relative: 1 %
HCT: 39.8 % (ref 39.0–52.0)
Hemoglobin: 13.5 g/dL (ref 13.0–17.0)
Immature Granulocytes: 1 %
Lymphocytes Relative: 23 %
Lymphs Abs: 2.2 10*3/uL (ref 0.7–4.0)
MCH: 34.1 pg — ABNORMAL HIGH (ref 26.0–34.0)
MCHC: 33.9 g/dL (ref 30.0–36.0)
MCV: 100.5 fL — ABNORMAL HIGH (ref 80.0–100.0)
Monocytes Absolute: 0.7 10*3/uL (ref 0.1–1.0)
Monocytes Relative: 7 %
Neutro Abs: 6.4 10*3/uL (ref 1.7–7.7)
Neutrophils Relative %: 68 %
Platelets: 244 10*3/uL (ref 150–400)
RBC: 3.96 MIL/uL — ABNORMAL LOW (ref 4.22–5.81)
RDW: 14.6 % (ref 11.5–15.5)
WBC: 9.4 10*3/uL (ref 4.0–10.5)
nRBC: 0 % (ref 0.0–0.2)

## 2019-02-19 LAB — GLUCOSE, CAPILLARY: Glucose-Capillary: 72 mg/dL (ref 70–99)

## 2019-02-19 MED ORDER — DIVALPROEX SODIUM 125 MG PO CSDR
250.0000 mg | DELAYED_RELEASE_CAPSULE | Freq: Two times a day (BID) | ORAL | Status: DC
  Administered 2019-02-19 – 2019-02-20 (×3): 250 mg via ORAL
  Filled 2019-02-19 (×3): qty 2

## 2019-02-19 NOTE — Progress Notes (Signed)
Physical Therapy Treatment Patient Details Name: [name] MRN: [id number] DOB: 10-Oct-1950 Today's Date: 02/19/2019    History of Present Illness 58yM admitted 01/20/19 with hypertensive emergency in setting of acute L MCA stroke s/p tPA, AF/RVR.  Has been noncompliant with his warfarin for A. Fib. Intubated for respiratory failure. Trach 01/29/19 with PEG planned for 01/30/19. No sedation at time of PT eval.     PT Comments    Pt with increased level of alertness and ability to participate in PT today. Pt able to amb 120' with RW with minA. Pt with flat affect, decreased insight to deficits and safety, and remains to require posey belt and bilat wrist restraints (all placed back on in chair with chair alarm set). Acute PT to cont to follow and progress mobility. SNF remains appropriate upon d/c.    Follow Up Recommendations  SNF;Supervision/Assistance - 24 hour     Equipment Recommendations  Other (comment)(TBD at next venue)    Recommendations for Other Services       Precautions / Restrictions Precautions Precautions: Fall Restrictions Weight Bearing Restrictions: No    Mobility  Bed Mobility Overal bed mobility: Needs Assistance Bed Mobility: Supine to Sit     Supine to sit: Min assist;HOB elevated     General bed mobility comments: pt more alert today and was able to bring self to EOB with max directional verbal and tactile cues, minA for trunk elevation  Transfers Overall transfer level: Needs assistance Equipment used: 2 person hand held assist Transfers: Sit to/from Stand;Stand Pivot Transfers Sit to Stand: +2 physical assistance;Min assist Stand pivot transfers: +2 physical assistance;Min assist       General transfer comment: minAx2 to steady and to assist with weight shifting to sequencing stepping to chair  Ambulation/Gait Ambulation/Gait assistance: Mod assist;+2 safety/equipment(for chair ofllow) Gait Distance (Feet): 120 Feet Assistive  device: Rolling walker (2 wheeled) Gait Pattern/deviations: Step-through pattern;Decreased stride length;Trunk flexed;Narrow base of support Gait velocity: dec Gait velocity interpretation: <1.31 ft/sec, indicative of household ambulator General Gait Details: max directional verbal cues to continue pushing walker forward, minA to propel walker and complete walker management around turns. no rest breaks needed   Stairs             Wheelchair Mobility    Modified Rankin (Stroke Patients Only) Modified Rankin (Stroke Patients Only) Pre-Morbid Rankin Score: No symptoms Modified Rankin: Moderately severe disability     Balance Overall balance assessment: Needs assistance Sitting-balance support: Feet supported Sitting balance-Leahy Scale: Fair Sitting balance - Comments: pt min guard as pt tends to lean very far forward and rests head on arms across kness   Standing balance support: Bilateral upper extremity supported Standing balance-Leahy Scale: Fair Standing balance comment: pt relies on RW or 2 person HHA                            Cognition Arousal/Alertness: Awake/alert Behavior During Therapy: Flat affect Overall Cognitive Status: Impaired/Different from baseline                   Orientation Level: Place;Time;Situation(able to recall [facility] once re-oriented) Current Attention Level: Focused   Following Commands: Follows one step commands with increased time;Follows one step commands inconsistently Safety/Judgement: Decreased awareness of safety;Decreased awareness of deficits Awareness: Intellectual Problem Solving: Slow processing;Decreased initiation;Difficulty sequencing;Requires verbal cues;Requires tactile cues General Comments: pt with flat affect and not engaging in conversation, quiet but followed commands with max  verbal and tactile cues      Exercises      General Comments General comments (skin integrity, edema, etc.): VSS       Pertinent Vitals/Pain Pain Assessment: Faces Faces Pain Scale: No hurt    Home Living                      Prior Function            PT Goals (current goals can now be found in the care plan section) Progress towards PT goals: Progressing toward goals    Frequency    Min 3X/week      PT Plan Current plan remains appropriate    Co-evaluation              AM-PAC PT "6 Clicks" Mobility   Outcome Measure  Help needed turning from your back to your side while in a flat bed without using bedrails?: A Little Help needed moving from lying on your back to sitting on the side of a flat bed without using bedrails?: A Little Help needed moving to and from a bed to a chair (including a wheelchair)?: A Lot Help needed standing up from a chair using your arms (e.g., wheelchair or bedside chair)?: A Lot Help needed to walk in hospital room?: A Lot Help needed climbing 3-5 steps with a railing? : Total 6 Click Score: 13    End of Session Equipment Utilized During Treatment: Gait belt Activity Tolerance: Patient tolerated treatment well Patient left: in chair;with call bell/phone within reach;with chair alarm set Nurse Communication: Mobility status PT Visit Diagnosis: Hemiplegia and hemiparesis;Other abnormalities of gait and mobility (R26.89);Other symptoms and signs involving the nervous system (R29.898) Hemiplegia - Right/Left: Right Hemiplegia - dominant/non-dominant: Dominant Hemiplegia - caused by: Cerebral infarction     Time: [id number] PT Time Calculation (min) (ACUTE ONLY): 24 min  Charges:  $Gait Training: 8-22 mins $Therapeutic Activity: 8-22 mins                     Kittie Plater, PT, DPT Acute Rehabilitation Services Pager #: [phone] Office #: [phone]    Berline Lopes 02/19/2019, 2:59 PM

## 2019-02-19 NOTE — Progress Notes (Signed)
PROGRESS NOTE    [name]   [id number]  DOB: November 06, 1950  DOA: 01/18/2019 PCP: Shon Baton, MD   Brief Narrative:  [name] 69 yo male smoker presented with HTN emergency and acute Lt MCA CVA s/p thrombolytic therapy, and A fib with RVR.    He required intubation for airway protection.  Sputum culture grew out streptococcal pneumonia and Enterobacter cloacae and he was treated with antibiotics. He had difficulty weaning from vent and required tracheostomy and PEG tube.  12/10 tPA administered 12/11 intubated 12/21 Tracheostomy 12/22 PEG placed 01/04 patient pulled out trach >> left out  Subjective: Non verbal.     Assessment & Plan:   Principal Problem:   Left embolic stroke-history of CVA -With left gaze, right hemiparesis, right neglect and acute confusion -Treated with TPA 06/18/2018 -CT head in ED-no acute findings -CT head 01/19/2019: Interval demarcation of acute cortically based infarct within the left frontal lobe - CTA head and neck- Prominent soft plaque at the left ICA bulb. 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  -MRI 12/12: Multifocal acute ischemia within left MCA territory including the left postcentral gyrus-no hemorrhage or mass-effect-old right occipital and cerebellar infarcts-chronic ischemic microangiopathy -2D echo 12/11: EF 55 to 60%-no thrombus or embolus noted -LDL 182- Lipitor increased from 40 to 80 mg daily -Hemoglobin A1c 5.1 -Initially treated with heparin-he was on warfarin prior to admission and INR was 1.1 on admission -he is now on Eliquis since 12/26  Active Problems:  A. fib with RVR -Started on Amiodarone and Cardizem infusions -Currently on metoprolol 100 mg twice daily, amiodarone 200 mg daily and Cardizem,short acting 30 mg every 6 hours  Delirium-acute metabolic encephalopathy -Vitamin B12 level was 161-supplements have been started  -Continues to be quite confused-receiving Seroquel and Depakote - Depakote increased to 500 BID- Seroquel > 100 at bedtime and 50 mg in AM is help him remain calm- will see how he does w/o restraints today & will ask for a sitter   Pulled PEG - replaced 1/9  Fever/ tachycardia/ leukocytosis 1/8 Enterobacter UTI 1/2 - recheck blood cultures, UA, Urine culture and CXR - CXR negative- likely due to UTI - see below - resumed Ceftriaxone 1/8- fevers resolved- the medication "fell off " the [facility]- d/w pharmacy - sepsis picture resolved- U culture still growing >=100,000 COLONIES/mL ENTEROBACTER CLOACAE - Cont ceftriaxone- reset schedule to day 1 being 1/8  Acute respiratory failure-hypoxic -Initially intubated for airway protection secondary to acute CVA -The patient also developed pneumonia with cultures growing out strep pneumonia and Enterobacter cloacae and was treated with cefepime -Underwent tracheostomy on 12/21 -He pulled his trach out on 1/4  Dysphagia severe protein calorie malnutrition -He continues to have a PEG tube -Continue Osmolite 474 mL 3 times daily -prostat 30 cc daily - refusing to eat/drink thus I will cont all food and meds via tube  B12 and Iron deficiency anemia with macrocytosis -Feraheme given on 12/29 - Macrocytosis likely due to B12 deficiency and ETOH abuse  Thrombocytosis -started on Hydrea by Dr. Marin Olp on 12/28-JAK2 assay was negative -Platelet count improving after IV iron given therefore was secondary to IDA- Hydrea d/c'd 1/7    Tobacco abuse    Time spent in minutes: 40  DVT prophylaxis: Eliquis Code Status: Full code Family Communication:  Disposition Plan: SNF Consultants:   PCCM admitted patient  Neurology  Hematology  Palliative care  General surgery Procedures:  Transthoracic Echocardiogram  01/19/2019 IMPRESSIONS 1. Left ventricular ejection fraction, by visual estimation, is 55 to 60%. The left ventricle has normal  function. There is moderately increased left ventricular hypertrophy. 2. The left ventricle demonstrates regional wall motion abnormalities. 3. Global right ventricle has normal systolic function.The right ventricular size is normal. No increase in right ventricular wall thickness. 4. Left atrial size was normal. 5. Right atrial size was mildly dilated. 6. The mitral valve is normal in structure. Mild to moderate mitral valve regurgitation. 7. The tricuspid valve is normal in structure. Tricuspid valve regurgitation is trivial. 8. The aortic valve is normal in structure. Aortic valve regurgitation is not visualized. 9. The pulmonic valve was normal in structure. Pulmonic valve regurgitation is not visualized. 10. The atrial septum is grossly normal.  Antimicrobials:  Anti-infectives (From admission, onward)   Start     Dose/Rate Route Frequency Ordered Stop   02/16/19 1045  cefTRIAXone (ROCEPHIN) 1 g in sodium chloride 0.9 % 100 mL IVPB     1 g 200 mL/hr over 30 Minutes Intravenous Every 24 hours 02/16/19 1042     02/12/19 1800  cefTRIAXone (ROCEPHIN) 1 g in sodium chloride 0.9 % 100 mL IVPB     1 g 200 mL/hr over 30 Minutes Intravenous  Once 02/12/19 1348 02/13/19 0412   02/10/19 1430  cefTRIAXone (ROCEPHIN) 1 g in sodium chloride 0.9 % 100 mL IVPB     1 g 200 mL/hr over 30 Minutes Intravenous Every 24 hours 02/10/19 1347 02/11/19 1754   01/26/19 1000  ceFEPIme (MAXIPIME) 2 g in sodium chloride 0.9 % 100 mL IVPB     2 g 200 mL/hr over 30 Minutes Intravenous Every 8 hours 01/26/19 0955 02/02/19 0145   01/25/19 1000  Ampicillin-Sulbactam (UNASYN) 3 g in sodium chloride 0.9 % 100 mL IVPB  Status:  Discontinued     3 g 200 mL/hr over 30 Minutes Intravenous Every 8 hours 01/25/19 0810 01/26/19 0955       Objective: Vitals:   02/18/19 1937 02/18/19 2336 02/19/19 0349 02/19/19 0500  BP: 129/75 139/87 119/69   Pulse: 97 88 75   Resp: (!) 22 17 13    Temp: 98 F (36.7 C) 98.5  F (36.9 C) 98.4 F (36.9 C)   TempSrc: Oral Oral Axillary   SpO2: 100% 98% 100%   Weight:    67 kg  Height:        Intake/Output Summary (Last 24 hours) at 02/19/2019 1049 Last data filed at 02/19/2019 0350 Gross per 24 hour  Intake -  Output 1800 ml  Net -1800 ml   Filed Weights   02/17/19 0500 02/18/19 0500 02/19/19 0500  Weight: 68 kg 67.7 kg 67 kg    Examination: General exam: Appears comfortable - asleep HEENT: PERRLA, oral mucosa moist, no sclera icterus or thrush Respiratory system: Clear to auscultation. Respiratory effort normal. Cardiovascular system: S1 & S2 heard,  No murmurs  Gastrointestinal system: Abdomen soft, non-tender, nondistended. Normal bowel sounds  - PEG present Extremities: No cyanosis, clubbing or edema Skin: No rashes or ulcers      Data Reviewed: I have personally reviewed following labs and imaging studies  CBC: Recent Labs  Lab 02/15/19 0519 02/16/19 0905 02/17/19 0649 02/18/19 0116 02/19/19 0611  WBC 8.3 17.4* 20.0* 15.3* 9.4  NEUTROABS 4.7 15.0* 15.5* 12.0* 6.4  HGB 13.7 14.1 13.8 13.8 13.5  HCT 41.5 41.9 40.7 41.7 39.8  MCV 102.2* 101.2* 100.7* 101.5* 100.5*  PLT 380 342  304 267 [id number]   Basic Metabolic Panel: Recent Labs  Lab 02/16/19 0905 02/17/19 0649 02/18/19 0116  NA 135 131* 133*  K 4.4 4.2 3.9  CL 96* 93* 96*  CO2 26 26 25   GLUCOSE 88 98 134*  BUN 23 21 16   CREATININE 0.87 0.78 0.76  CALCIUM 9.9 9.5 9.3   GFR: Estimated Creatinine Clearance: 82.6 mL/min (by C-G formula based on SCr of 0.76 mg/dL). Liver Function Tests: No results for input(s): AST, ALT, ALKPHOS, BILITOT, PROT, ALBUMIN in the last 168 hours. No results for input(s): LIPASE, AMYLASE in the last 168 hours. No results for input(s): AMMONIA in the last 168 hours. Coagulation Profile: No results for input(s): INR, PROTIME in the last 168 hours. Cardiac Enzymes: No results for input(s): CKTOTAL, CKMB, CKMBINDEX, TROPONINI in the last 168 hours.  BNP (last 3 results) No results for input(s): PROBNP in the last 8760 hours. HbA1C: No results for input(s): HGBA1C in the last 72 hours. CBG: Recent Labs  Lab 02/14/19 1138 02/14/19 1548 02/14/19 2029 02/14/19 2350 02/15/19 0438  GLUCAP 135* 76 111* 125* 83   Lipid Profile: No results for input(s): CHOL, HDL, LDLCALC, TRIG, CHOLHDL, LDLDIRECT in the last 72 hours. Thyroid Function Tests: No results for input(s): TSH, T4TOTAL, FREET4, T3FREE, THYROIDAB in the last 72 hours. Anemia Panel: No results for input(s): VITAMINB12, FOLATE, FERRITIN, TIBC, IRON, RETICCTPCT in the last 72 hours. Urine analysis:    Component Value Date/Time   COLORURINE YELLOW 02/16/2019 1345   APPEARANCEUR HAZY (A) 02/16/2019 1345   LABSPEC 1.012 02/16/2019 1345   PHURINE 7.0 02/16/2019 1345   GLUCOSEU NEGATIVE 02/16/2019 1345   GLUCOSEU NEGATIVE 12/13/2012 1514   HGBUR NEGATIVE 02/16/2019 1345   [place] 02/16/2019 1345   KETONESUR NEGATIVE 02/16/2019 1345   PROTEINUR NEGATIVE 02/16/2019 1345   UROBILINOGEN 0.2 12/13/2012 1514   NITRITE POSITIVE (A) 02/16/2019 1345   LEUKOCYTESUR LARGE (A) 02/16/2019 1345   Sepsis Labs: @LABRCNTIP (procalcitonin:4,lacticidven:4) ) Recent Results (from the past 240 hour(s))  Urine Culture     Status: Abnormal   Collection Time: 02/10/19  6:10 PM   Specimen: Urine, Catheterized  Result Value Ref Range Status   Specimen Description URINE, CATHETERIZED  Final   Special Requests   Final    URINE, CLEAN CATCH Performed at [facility], [place] [address], [place], [place] [address]    Culture >=100,000 COLONIES/mL ENTEROBACTER CLOACAE (A)  Final   Report Status 02/13/2019 FINAL  Final   Organism ID, Bacteria ENTEROBACTER CLOACAE (A)  Final      Susceptibility   Enterobacter cloacae - MIC*    CEFAZOLIN >=64 RESISTANT Resistant     CIPROFLOXACIN <=0.25 SENSITIVE Sensitive     GENTAMICIN <=1 SENSITIVE Sensitive     IMIPENEM <=0.25 SENSITIVE  Sensitive     NITROFURANTOIN <=16 SENSITIVE Sensitive     TRIMETH/SULFA <=20 SENSITIVE Sensitive     PIP/TAZO >=128 RESISTANT Resistant     * >=100,000 COLONIES/mL ENTEROBACTER CLOACAE  Culture, blood (routine x 2)     Status: None (Preliminary result)   Collection Time: 02/16/19 12:00 PM   Specimen: BLOOD  Result Value Ref Range Status   Specimen Description BLOOD RIGHT ANTECUBITAL  Final   Special Requests   Final    BOTTLES DRAWN AEROBIC ONLY Blood Culture adequate volume   Culture   Final    NO GROWTH 3 DAYS Performed at [facility], [address] [place], [place] [address]  Report Status PENDING  Incomplete  Culture, blood (routine x 2)     Status: None (Preliminary result)   Collection Time: 02/16/19 12:26 PM   Specimen: BLOOD RIGHT ARM  Result Value Ref Range Status   Specimen Description BLOOD RIGHT ARM  Final   Special Requests   Final    BOTTLES DRAWN AEROBIC ONLY Blood Culture adequate volume   Culture   Final    NO GROWTH 3 DAYS Performed at [facility], [address] [place], [place] [address]    Report Status PENDING  Incomplete  Culture, Urine     Status: Abnormal   Collection Time: 02/16/19  1:20 PM   Specimen: Urine, Catheterized  Result Value Ref Range Status   Specimen Description URINE, CATHETERIZED  Final   Special Requests   Final    NONE Performed at [facility], [place] [address] [place], [place] [address]    Culture >=100,000 COLONIES/mL ENTEROBACTER CLOACAE (A)  Final   Report Status 02/19/2019 FINAL  Final   Organism ID, Bacteria ENTEROBACTER CLOACAE (A)  Final      Susceptibility   Enterobacter cloacae - MIC*    CEFAZOLIN >=64 RESISTANT Resistant     CIPROFLOXACIN <=0.25 SENSITIVE Sensitive     GENTAMICIN <=1 SENSITIVE Sensitive     IMIPENEM <=0.25 SENSITIVE Sensitive     NITROFURANTOIN <=16 SENSITIVE Sensitive     TRIMETH/SULFA <=20 SENSITIVE Sensitive     PIP/TAZO >=128 RESISTANT Resistant     * >=100,000  COLONIES/mL ENTEROBACTER CLOACAE         Radiology Studies: IR Replc Gastro/Colonic Tube Percut W/Fluoro  Result Date: 02/17/2019 INDICATION: 69 year old with a dislodged gastrostomy tube. Foley catheter has been placed to keep tract intact. EXAM: GASTROSTOMY TUBE REPLACEMENT WITH FLUOROSCOPY MEDICATIONS: None ANESTHESIA/SEDATION: None CONTRAST:  25 mL Omnipaque 300-administered into the gastric lumen. FLUOROSCOPY TIME:  Fluoroscopy Time: 30 seconds, 3 mGy COMPLICATIONS: None immediate. PROCEDURE: Foley catheter was within the gastrostomy tube tract. The Foley catheter and surrounding skin was prepped and draped in sterile fashion. Contrast injection confirmed placement in the stomach. Foley catheter balloon deflated and tube removed. An 71 French balloon retention gastrostomy tube was easily advanced into the stomach. Balloon was inflated with approximately 8 mL of saline. Contrast injection confirmed placement in the stomach. Fluoroscopic images were taken and saved for this procedure. IMPRESSION: Successful replacement of the gastrostomy tube. Patient now has an 1 French balloon retention gastrostomy tube. Electronically Signed   By: Markus Daft M.D.   On: 02/17/2019 18:19      Scheduled Meds: . amiodarone  200 mg Per Tube Daily  . apixaban  5 mg Per Tube BID  . atorvastatin  80 mg Per Tube q1800  . chlorhexidine  15 mL Mouth Rinse BID  . diltiazem  30 mg Per Tube Q6H  . divalproex  250 mg Oral Q12H  . feeding supplement (OSMOLITE 1.5 CAL)  474 mL Per Tube TID  . feeding supplement (PRO-STAT SUGAR FREE 64)  30 mL Per Tube Q0600  . folic acid  2 mg Oral Daily  . mouth rinse  15 mL Mouth Rinse q12n4p  . metoprolol tartrate  100 mg Per Tube BID  . QUEtiapine  50 mg Per Tube Daily  . sennosides  5 mL Per Tube BID  . vitamin B-12  1,000 mcg Per Tube Daily   Continuous Infusions: . sodium chloride 10 mL/hr at 02/01/19 0541  . cefTRIAXone (ROCEPHIN)  IV 1  g (02/18/19 0945)     LOS: 32  days      Debbe Odea, MD [facility] Pager: [email or web address] Password [facility] 02/19/2019, 10:49 AM

## 2019-02-19 NOTE — Progress Notes (Signed)
  The platelet count is still trending downward which is nice to see.  I suspect this thrombocytosis was likely more related to iron deficiency.His platelet count is 244,000.  Hemoglobin 13.5.  White cell count 9.4.  Right now, and we will just plan to sign off.  He has no hypercoagulable issues.  All the studies were normal.  His assays for myeloproliferative disorders also have been negative.  Hopefully, he will be able to regain some more neurological function and have some type of meaningful life.  Lattie Haw, MD  [place] 3:20

## 2019-02-20 LAB — CBC WITH DIFFERENTIAL/PLATELET
Abs Immature Granulocytes: 0.16 10*3/uL — ABNORMAL HIGH (ref 0.00–0.07)
Basophils Absolute: 0 10*3/uL (ref 0.0–0.1)
Basophils Relative: 0 %
Eosinophils Absolute: 0.1 10*3/uL (ref 0.0–0.5)
Eosinophils Relative: 1 %
HCT: 41.1 % (ref 39.0–52.0)
Hemoglobin: 13.5 g/dL (ref 13.0–17.0)
Immature Granulocytes: 2 %
Lymphocytes Relative: 30 %
Lymphs Abs: 2.4 10*3/uL (ref 0.7–4.0)
MCH: 33.8 pg (ref 26.0–34.0)
MCHC: 32.8 g/dL (ref 30.0–36.0)
MCV: 102.8 fL — ABNORMAL HIGH (ref 80.0–100.0)
Monocytes Absolute: 0.6 10*3/uL (ref 0.1–1.0)
Monocytes Relative: 8 %
Neutro Abs: 4.7 10*3/uL (ref 1.7–7.7)
Neutrophils Relative %: 59 %
Platelets: 260 10*3/uL (ref 150–400)
RBC: 4 MIL/uL — ABNORMAL LOW (ref 4.22–5.81)
RDW: 14.4 % (ref 11.5–15.5)
WBC: 7.9 10*3/uL (ref 4.0–10.5)
nRBC: 0 % (ref 0.0–0.2)

## 2019-02-20 MED ORDER — PRO-STAT SUGAR FREE PO LIQD
30.0000 mL | Freq: Two times a day (BID) | ORAL | Status: DC
  Administered 2019-02-20 – 2019-03-03 (×22): 30 mL
  Filled 2019-02-20 (×22): qty 30

## 2019-02-20 MED ORDER — QUETIAPINE FUMARATE 50 MG PO TABS
150.0000 mg | ORAL_TABLET | Freq: Every day | ORAL | Status: DC
  Administered 2019-02-20 – 2019-03-02 (×11): 150 mg via ORAL
  Filled 2019-02-20 (×11): qty 1

## 2019-02-20 MED ORDER — DIVALPROEX SODIUM 125 MG PO CSDR
500.0000 mg | DELAYED_RELEASE_CAPSULE | Freq: Two times a day (BID) | ORAL | Status: DC
  Administered 2019-02-20 – 2019-03-03 (×22): 500 mg via ORAL
  Filled 2019-02-20 (×23): qty 4

## 2019-02-20 NOTE — Progress Notes (Addendum)
PROGRESS NOTE    [name]   [id number]  DOB: 02/24/1950  DOA: 01/18/2019 PCP: Shon Baton, MD   Brief Narrative:  [name] 69 yo male smoker with A-fib on coumadin, HTN, tobacco abuse who was brought to the ED for a change in mental status and was found to be nonverbal in the ED and found to have an acute Lt MCA CVA. He received thrombolytics in the ED and he required intubation for airway protection.  Sputum culture grew out streptococcal pneumonia and Enterobacter cloacae and he was treated with antibiotics. He had difficulty weaning from vent and required tracheostomy and PEG tube.  12/10 tPA administered 12/11 intubated 12/21 Tracheostomy 12/22 PEG placed 01/04 patient pulled out trach >> left out  Subjective: Non verbal.      Assessment & Plan:   Principal Problem:   Left embolic stroke-history of CVA -With left gaze, right hemiparesis, right neglect, aphasia and acute confusion/ agitation -Treated with TPA 06/18/2018 -CT head in ED-no acute findings -CT head 01/19/2019: Interval demarcation of acute cortically based infarct within the left frontal lobe - CTA head and neck- Prominent soft plaque at the left ICA bulb. 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  -MRI 12/12: Multifocal acute ischemia within left MCA territory including the left postcentral gyrus-no hemorrhage or mass-effect-old right occipital and cerebellar infarcts-chronic ischemic microangiopathy -2D echo 12/11: EF 55 to 60%-no thrombus or embolus noted -LDL 182- Lipitor increased from 40 to 80 mg daily -Hemoglobin A1c 5.1 -Initially treated with heparin-he was on warfarin prior to admission and INR was 1.1 on admission -he is now on Eliquis since 12/26  Active Problems:  A. fib with RVR -Started on Amiodarone and Cardizem infusions -Currently on metoprolol 100 mg twice daily, amiodarone 200 mg daily and  Cardizem,short acting 30 mg every 6 hours  Delirium-acute metabolic encephalopathy -Vitamin B12 level was 161-supplements have been started -Continues to be quite confused and mostly non-verbal -receiving Seroquel and Depakote - Depakote increased to 500 BID- Seroquel increased to 100 at bedtime and 50 mg in AM    Fever/ tachycardia/ leukocytosis 1/8 Enterobacter UTI 1/2 - recheck blood cultures, UA, Urine culture and CXR - CXR negative- likely due to UTI - see below - resumed Ceftriaxone 1/8- fevers resolved- the medication "fell off " the [facility]- d/w pharmacy - sepsis picture resolved- U culture still growing >=100,000 COLONIES/mL ENTEROBACTER CLOACAE - Cont ceftriaxone- reset schedule to day 1 being 1/8- stop date 1/14  Pulled PEG - replaced 1/9  Acute respiratory failure-hypoxic -Initially intubated for airway protection secondary to acute CVA -The patient also developed pneumonia with cultures growing out strep pneumonia and Enterobacter cloacae and was treated with cefepime -Underwent tracheostomy on 12/21 -He pulled his trach out on 1/4  Dysphagia severe protein calorie malnutrition -He continues to have a PEG tube -Continue Osmolite 474 mL 3 times daily -prostat 30 cc daily - refusing to eat/drink thus I will cont all food and meds via tube  B12 and Iron deficiency anemia with macrocytosis -Feraheme given on 12/29 - Macrocytosis likely due to B12 deficiency and ETOH abuse  Thrombocytosis -started on Hydrea by Dr. Marin Olp on 12/28-JAK2 assay was negative -Platelet count improving after IV iron given therefore was secondary to IDA- Hydrea d/c'd 1/7    Tobacco abuse    Time spent in minutes: 40  DVT prophylaxis: Eliquis Code Status: Full code Family Communication:  Disposition Plan: SNF is  the long term plan- currently still having some trouble with agitation but not as severe as before- d/c antibiotics on 1/14 Consultants:   PCCM admitted  patient  Neurology  Hematology  Palliative care  General surgery Procedures:  Transthoracic Echocardiogram  01/19/2019 IMPRESSIONS 1. Left ventricular ejection fraction, by visual estimation, is 55 to 60%. The left ventricle has normal function. There is moderately increased left ventricular hypertrophy. 2. The left ventricle demonstrates regional wall motion abnormalities. 3. Global right ventricle has normal systolic function.The right ventricular size is normal. No increase in right ventricular wall thickness. 4. Left atrial size was normal. 5. Right atrial size was mildly dilated. 6. The mitral valve is normal in structure. Mild to moderate mitral valve regurgitation. 7. The tricuspid valve is normal in structure. Tricuspid valve regurgitation is trivial. 8. The aortic valve is normal in structure. Aortic valve regurgitation is not visualized. 9. The pulmonic valve was normal in structure. Pulmonic valve regurgitation is not visualized. 10. The atrial septum is grossly normal.  Antimicrobials:  Anti-infectives (From admission, onward)   Start     Dose/Rate Route Frequency Ordered Stop   02/16/19 1045  cefTRIAXone (ROCEPHIN) 1 g in sodium chloride 0.9 % 100 mL IVPB     1 g 200 mL/hr over 30 Minutes Intravenous Every 24 hours 02/16/19 1042     02/12/19 1800  cefTRIAXone (ROCEPHIN) 1 g in sodium chloride 0.9 % 100 mL IVPB     1 g 200 mL/hr over 30 Minutes Intravenous  Once 02/12/19 1348 02/13/19 0412   02/10/19 1430  cefTRIAXone (ROCEPHIN) 1 g in sodium chloride 0.9 % 100 mL IVPB     1 g 200 mL/hr over 30 Minutes Intravenous Every 24 hours 02/10/19 1347 02/11/19 1754   01/26/19 1000  ceFEPIme (MAXIPIME) 2 g in sodium chloride 0.9 % 100 mL IVPB     2 g 200 mL/hr over 30 Minutes Intravenous Every 8 hours 01/26/19 0955 02/02/19 0145   01/25/19 1000  Ampicillin-Sulbactam (UNASYN) 3 g in sodium chloride 0.9 % 100 mL IVPB  Status:  Discontinued     3 g 200 mL/hr over 30  Minutes Intravenous Every 8 hours 01/25/19 0810 01/26/19 0955       Objective: Vitals:   02/20/19 0000 02/20/19 0525 02/20/19 0946 02/20/19 1100  BP:   (!) 133/96 118/79  Pulse:   84 80  Resp:    16  Temp:  98.1 F (36.7 C)  98.1 F (36.7 C)  TempSrc: Axillary Oral  Oral  SpO2:      Weight:      Height:        Intake/Output Summary (Last 24 hours) at 02/20/2019 1419 Last data filed at 02/20/2019 1242 Gross per 24 hour  Intake --  Output 200 ml  Net -200 ml   Filed Weights   02/17/19 0500 02/18/19 0500 02/19/19 0500  Weight: 68 kg 67.7 kg 67 kg    Examination: General exam: Appears restless, awake and continues to state "NO" HEENT: PERRLA, oral mucosa moist, no sclera icterus or thrush- tracheotomy scar noted Respiratory system: Clear to auscultation. Respiratory effort normal. Cardiovascular system: S1 & S2 heard,  No murmurs  Gastrointestinal system: Abdomen soft, non-tender, nondistended. Normal bowel sounds - PEG present with abdominal binder in plce  Neuro: right sided weakness, does not follow commands, mostly non-verbal Extremities: No cyanosis, clubbing or edema Skin: No rashes or ulcers       Data Reviewed: I have personally reviewed following labs  and imaging studies  CBC: Recent Labs  Lab 02/16/19 0905 02/17/19 0649 02/18/19 0116 02/19/19 0611 02/20/19 0713  WBC 17.4* 20.0* 15.3* 9.4 7.9  NEUTROABS 15.0* 15.5* 12.0* 6.4 4.7  HGB 14.1 13.8 13.8 13.5 13.5  HCT 41.9 40.7 41.7 39.8 41.1  MCV 101.2* 100.7* 101.5* 100.5* 102.8*  PLT 342 304 267 244 [id number]   Basic Metabolic Panel: Recent Labs  Lab 02/16/19 0905 02/17/19 0649 02/18/19 0116  NA 135 131* 133*  K 4.4 4.2 3.9  CL 96* 93* 96*  CO2 26 26 25   GLUCOSE 88 98 134*  BUN 23 21 16   CREATININE 0.87 0.78 0.76  CALCIUM 9.9 9.5 9.3   GFR: Estimated Creatinine Clearance: 82.6 mL/min (by C-G formula based on SCr of 0.76 mg/dL). Liver Function Tests: No results for input(s): AST, ALT, ALKPHOS,  BILITOT, PROT, ALBUMIN in the last 168 hours. No results for input(s): LIPASE, AMYLASE in the last 168 hours. No results for input(s): AMMONIA in the last 168 hours. Coagulation Profile: No results for input(s): INR, PROTIME in the last 168 hours. Cardiac Enzymes: No results for input(s): CKTOTAL, CKMB, CKMBINDEX, TROPONINI in the last 168 hours. BNP (last 3 results) No results for input(s): PROBNP in the last 8760 hours. HbA1C: No results for input(s): HGBA1C in the last 72 hours. CBG: Recent Labs  Lab 02/14/19 1548 02/14/19 2029 02/14/19 2350 02/15/19 0438 02/19/19 2059  GLUCAP 76 111* 125* 83 72   Lipid Profile: No results for input(s): CHOL, HDL, LDLCALC, TRIG, CHOLHDL, LDLDIRECT in the last 72 hours. Thyroid Function Tests: No results for input(s): TSH, T4TOTAL, FREET4, T3FREE, THYROIDAB in the last 72 hours. Anemia Panel: No results for input(s): VITAMINB12, FOLATE, FERRITIN, TIBC, IRON, RETICCTPCT in the last 72 hours. Urine analysis:    Component Value Date/Time   COLORURINE YELLOW 02/16/2019 1345   APPEARANCEUR HAZY (A) 02/16/2019 1345   LABSPEC 1.012 02/16/2019 1345   PHURINE 7.0 02/16/2019 1345   GLUCOSEU NEGATIVE 02/16/2019 1345   GLUCOSEU NEGATIVE 12/13/2012 1514   HGBUR NEGATIVE 02/16/2019 1345   [place] 02/16/2019 1345   KETONESUR NEGATIVE 02/16/2019 1345   PROTEINUR NEGATIVE 02/16/2019 1345   UROBILINOGEN 0.2 12/13/2012 1514   NITRITE POSITIVE (A) 02/16/2019 1345   LEUKOCYTESUR LARGE (A) 02/16/2019 1345   Sepsis Labs: @LABRCNTIP (procalcitonin:4,lacticidven:4) ) Recent Results (from the past 240 hour(s))  Urine Culture     Status: Abnormal   Collection Time: 02/10/19  6:10 PM   Specimen: Urine, Catheterized  Result Value Ref Range Status   Specimen Description URINE, CATHETERIZED  Final   Special Requests   Final    URINE, CLEAN CATCH Performed at [facility], [place] [address] [place], [place] [address]    Culture >=100,000  COLONIES/mL ENTEROBACTER CLOACAE (A)  Final   Report Status 02/13/2019 FINAL  Final   Organism ID, Bacteria ENTEROBACTER CLOACAE (A)  Final      Susceptibility   Enterobacter cloacae - MIC*    CEFAZOLIN >=64 RESISTANT Resistant     CIPROFLOXACIN <=0.25 SENSITIVE Sensitive     GENTAMICIN <=1 SENSITIVE Sensitive     IMIPENEM <=0.25 SENSITIVE Sensitive     NITROFURANTOIN <=16 SENSITIVE Sensitive     TRIMETH/SULFA <=20 SENSITIVE Sensitive     PIP/TAZO >=128 RESISTANT Resistant     * >=100,000 COLONIES/mL ENTEROBACTER CLOACAE  Culture, blood (routine x 2)     Status: None (Preliminary result)   Collection Time: 02/16/19 12:00 PM   Specimen: BLOOD  Result Value Ref  Range Status   Specimen Description BLOOD RIGHT ANTECUBITAL  Final   Special Requests   Final    BOTTLES DRAWN AEROBIC ONLY Blood Culture adequate volume   Culture   Final    NO GROWTH 4 DAYS Performed at [facility], [address] [place], [place] [address]    Report Status PENDING  Incomplete  Culture, blood (routine x 2)     Status: None (Preliminary result)   Collection Time: 02/16/19 12:26 PM   Specimen: BLOOD RIGHT ARM  Result Value Ref Range Status   Specimen Description BLOOD RIGHT ARM  Final   Special Requests   Final    BOTTLES DRAWN AEROBIC ONLY Blood Culture adequate volume   Culture   Final    NO GROWTH 4 DAYS Performed at [facility], [place] [address], [place], [place] [address]    Report Status PENDING  Incomplete  Culture, Urine     Status: Abnormal   Collection Time: 02/16/19  1:20 PM   Specimen: Urine, Catheterized  Result Value Ref Range Status   Specimen Description URINE, CATHETERIZED  Final   Special Requests   Final    NONE Performed at [facility], [place] [address] [place], [place] [address]    Culture >=100,000 COLONIES/mL ENTEROBACTER CLOACAE (A)  Final   Report Status 02/19/2019 FINAL  Final   Organism ID, Bacteria ENTEROBACTER CLOACAE (A)  Final      Susceptibility    Enterobacter cloacae - MIC*    CEFAZOLIN >=64 RESISTANT Resistant     CIPROFLOXACIN <=0.25 SENSITIVE Sensitive     GENTAMICIN <=1 SENSITIVE Sensitive     IMIPENEM <=0.25 SENSITIVE Sensitive     NITROFURANTOIN <=16 SENSITIVE Sensitive     TRIMETH/SULFA <=20 SENSITIVE Sensitive     PIP/TAZO >=128 RESISTANT Resistant     * >=100,000 COLONIES/mL ENTEROBACTER CLOACAE         Radiology Studies: No results found.    Scheduled Meds: . amiodarone  200 mg Per Tube Daily  . apixaban  5 mg Per Tube BID  . atorvastatin  80 mg Per Tube q1800  . chlorhexidine  15 mL Mouth Rinse BID  . diltiazem  30 mg Per Tube Q6H  . divalproex  250 mg Oral Q12H  . feeding supplement (OSMOLITE 1.5 CAL)  474 mL Per Tube TID  . feeding supplement (PRO-STAT SUGAR FREE 64)  30 mL Per Tube BID  . folic acid  2 mg Oral Daily  . mouth rinse  15 mL Mouth Rinse q12n4p  . metoprolol tartrate  100 mg Per Tube BID  . QUEtiapine  50 mg Per Tube Daily  . sennosides  5 mL Per Tube BID  . vitamin B-12  1,000 mcg Per Tube Daily   Continuous Infusions: . sodium chloride 10 mL/hr at 02/01/19 0541  . cefTRIAXone (ROCEPHIN)  IV 1 g (02/20/19 0946)     LOS: 33 days      Debbe Odea, MD [facility] Pager: [email or web address] Password [facility] 02/20/2019, 2:19 PM

## 2019-02-20 NOTE — Plan of Care (Signed)

## 2019-02-20 NOTE — Progress Notes (Signed)
  Speech Language Pathology Treatment: Dysphagia;Cognitive-Linquistic  Patient Details Name: [name] MRN: [id number] DOB: [birth year] Today's Date: 02/20/2019 Time: [id number] SLP Time Calculation (min) (ACUTE ONLY): 22 min  Assessment / Plan / Recommendation Clinical Impression  [name] has made excellent progress re: communication and swallowing since last ST session. Greater percentage of spontaneous language was meaningful in combination with continued paraphasias and neologisms. SLP facilitated naming of common objects when needed approximately 60% of the time providing initial sound/phoneme. 20% without any cues ("styrofoam cup", "Coke cola"). He followed one step commands with 80% accuracy no assist needed. Continues to have difficulty expressing thought/need if context not known and more abstract.   He was eager to have sips of Coke and trials chocolate pudding with therapist to control rate with 70% feeding assist. Delayed throat clears and wet vocal quality indicative of suspected airway compromise. Given increase acceptance of po's, recommend MBS tomorrow.    HPI HPI: Pt is a 69 y/o M admitted with L MCA CVA s/p TPA. He was intubated 12/11-12/13, reintubated 12/13; trach 12/21. PMH: tobacco use, previous strokes, GERD, COPD, AAA, PVD, HTN, HLD, pulmonary nodule, depression, afib (recently missed doses of coumadin)      SLP Plan  MBS       Recommendations  Diet recommendations: NPO Medication Administration: Via alternative means                Oral Care Recommendations: Oral care QID SLP Visit Diagnosis: Aphasia (R47.01);Dysphagia, unspecified (R13.10) Plan: MBS       GO                [name] 02/20/2019, 2:29 PM   [name].Ed Risk analyst [phone] Office [phone]

## 2019-02-20 NOTE — Progress Notes (Signed)
Nutrition Follow-up  DOCUMENTATION CODES:   Severe malnutrition in context of chronic illness  INTERVENTION:  Continue bolus tube feeds using Osmolite 1.5 formula via PEG at goal volume of 474 ml (2 cartons/ARCS) given TID.   Provide 30 ml Prostat BID per tube.  Tube feeding regimen to provide 2333 kcal (100% of needs), 119 grams of protein, and 1081 ml of free water.  NUTRITION DIAGNOSIS:   Severe Malnutrition related to chronic illness(COPD) as evidenced by severe fat depletion, severe muscle depletion; ongoing  GOAL:   Patient will meet greater than or equal to 90% of their needs; met with TF  MONITOR:   TF tolerance, Weight trends, Skin, Labs, I & O's  REASON FOR ASSESSMENT:   Consult, Ventilator Enteral/tube feeding initiation and management  ASSESSMENT:   69 yo male admitted with hypertensive emergency in setting of acute L MCA stroke, required intubation for airway protection, pt choking on own secretions. PMH includes COPD, AF, CVA, PAD.  12/21 S/P tracheostomy 12/22 PEG placed  1/4 Trach pulled out and left out 1/9 PEG pulled and was replaced by IR  Pt has been tolerating his bolus tube feeds with no difficulties. RD to increase Prostat to BID to aid in increased caloric and protein needs. RD to continue to monitor for tolerance.  Labs and medications reviewed.   Diet Order:   Diet Order            Diet NPO time specified  Diet effective now              EDUCATION NEEDS:   Not appropriate for education at this time  Skin:  Skin Assessment: Skin Integrity Issues: Skin Integrity Issues:: Stage II Stage II: sacrum  Last BM:  1/10  Height:   Ht Readings from Last 1 Encounters:  01/28/19 5' 8.5" (1.74 m)    Weight:   Wt Readings from Last 1 Encounters:  02/19/19 67 kg    Ideal Body Weight:  70 kg  BMI:  Body mass index is 22.13 kg/m.  Estimated Nutritional Needs:   Kcal:  0931-1216  Protein:  100-120 grams  Fluid:   >2L    Corrin Parker, MS, RD, LDN Pager # [phone] After hours/ weekend pager # [phone]

## 2019-02-21 ENCOUNTER — Inpatient Hospital Stay (HOSPITAL_COMMUNITY): Payer: Medicare Other

## 2019-02-21 LAB — CULTURE, BLOOD (ROUTINE X 2)
Culture: NO GROWTH
Culture: NO GROWTH
Special Requests: ADEQUATE
Special Requests: ADEQUATE

## 2019-02-21 MED ORDER — DILTIAZEM 12 MG/ML ORAL SUSPENSION
30.0000 mg | Freq: Four times a day (QID) | ORAL | Status: DC
  Administered 2019-02-21 – 2019-03-01 (×30): 30 mg via ORAL
  Filled 2019-02-21 (×32): qty 3

## 2019-02-21 MED ORDER — RESOURCE THICKENUP CLEAR PO POWD
ORAL | Status: DC | PRN
  Filled 2019-02-21 (×2): qty 125

## 2019-02-21 MED ORDER — ATORVASTATIN CALCIUM 80 MG PO TABS
80.0000 mg | ORAL_TABLET | Freq: Every day | ORAL | Status: DC
  Administered 2019-02-21 – 2019-03-02 (×10): 80 mg via ORAL
  Filled 2019-02-21 (×10): qty 1

## 2019-02-21 NOTE — TOC Initial Note (Signed)
Transition of Care [facility]) - Initial/Assessment Note    Patient Details  Name: [name] MRN: [id number] Date of Birth: 09/10/1950  Transition of [facility]) CM/SW Contact:    Vinie Sill, [place] Phone Number: 02/21/2019, 3:42 PM  Clinical Narrative:                  CSW spoke with the patient's brother, [phone]).CSW introduced self and explained role. Patient's brother confirmed patient stays with a roommate. CSW discussed Pt recommendation of ST rehab at [facility]. Patient's brother is agreeable to ST rehab at [facility]. Patient has never been to SNF before. Family preference is [facility] but gave CSW permission to send referrals to other [facility] SNFs. CSW explained the SNF process. Family expressed appreciation for CSW assistance. Family states no other questions at this time.   CSW will continue to follow and assist with discharge planning. CSW will provide bed offers once available.   Thurmond Butts, MSW, LCSWA Clinical Social Worker   Expected Discharge Plan: Skilled Nursing Facility Barriers to Discharge: SNF Pending bed offer, Continued Medical Work up   Patient Goals and CMS Choice        Expected Discharge Plan and Services Expected Discharge Plan: [place] In-house Referral: Clinical Social Work     Living arrangements for the past 2 months: Single Family Home                                      Prior Living Arrangements/Services Living arrangements for the past 2 months: Single Family Home Lives with:: Self, Roommate          Need for Family Participation in Patient Care: Yes (Comment) Care giver support system in place?: No (comment)   Criminal Activity/Legal Involvement Pertinent to Current Situation/Hospitalization: No - Comment as needed  Activities of Daily Living      Permission Sought/Granted Permission sought to share information with : Family Supports, Customer service manager, Case  Optician, dispensing granted to share information with : Yes, Verbal Permission Granted  Share Information with NAME: Kendra Opitz  Permission granted to share info w AGENCY: SNF  Permission granted to share info w Relationship: Brother  Permission granted to share info w Contact Information: [phone]  Emotional Assessment       Orientation: : Oriented to Self, Oriented to Place, Oriented to  Time, Oriented to Situation Alcohol / Substance Use: Not Applicable Psych Involvement: No (comment)  Admission diagnosis:  Stroke (cerebrum) ([place]) [I63.9] Cerebrovascular accident (CVA), unspecified mechanism ([place]) [I63.9] Patient Active Problem List   Diagnosis Date Noted  . Fever   . Tachycardia   . Tachypnea   . B12 deficiency 02/09/2019  . Hx of embolic stroke [id number]  . Dysphagia due to recent cerebrovascular accident 02/09/2019  . VAP (ventilator-associated pneumonia) ([place]) 02/09/2019  . Thrombocytosis ([place]) 02/09/2019  . Fe deficiency anemia 02/09/2019  . Pressure injury of skin 02/06/2019  . Protein-calorie malnutrition, severe 01/30/2019  . Acute on chronic respiratory failure ([place]) 01/28/2019  . Aspiration into airway   . Endotracheal tube present   . Hypertensive emergency   . Stroke (cerebrum) ([place]) 01/18/2019  . Hydronephrosis 07/17/2016  . Kidney stone 07/17/2016  . Acute kidney injury ([place]) 07/17/2016  . Elevated troponin 07/17/2016  . Adjustment disorder with disturbance of emotion 07/14/2016  . Hyperlipemia 07/07/2016  . Arterial occlusion due to stenosis ([place]) 08/21/2015  . Aftercare  following surgery of the circulatory system 08/21/2015  . Current smoker 08/21/2015  . Abdominal aortic aneurysm (AAA) ([place]) 05/02/2015  . MDD (major depressive disorder), single episode, severe , no psychosis ([place]) 02/10/2015  . Major depressive disorder, recurrent, severe without psychotic features ([place]) 02/09/2015  . Limb ischemia 01/23/2015  . Numbness 01/20/2015  . PAD  (peripheral artery disease) ([place]) 12/10/2014  . Embolic stroke ([place]) [id number]  . Ischemia of extremity 11/20/2014  . Ischemia of hand 04/05/2014  . Special screening for malignant neoplasms, colon 03/20/2014  . Warfarin-induced coagulopathy ([place]) 03/20/2014  . Encounter for therapeutic drug monitoring 03/08/2013  . Headache 01/29/2013  . Prostate nodule with urinary obstruction 12/13/2012  . Routine general medical examination at a health care facility 12/13/2012  . Depression with anxiety 12/07/2012  . Long term (current) use of anticoagulants 04/30/2010  . HYPERCHOLESTEROLEMIA-PURE 04/22/2008  . TOBACCO ABUSE 04/22/2008  . HYPERTENSION, BENIGN ESSENTIAL 04/22/2008  . Atrial fibrillation with RVR ([place]) 04/22/2008  . COPD (chronic obstructive pulmonary disease) ([place]) 04/22/2008   PCP:  Shon Baton, MD Pharmacy:   [place], [place] [address] Phone: [phone] Fax: [phone]  [facility]), [place] - [id number] [place]) [place] [address] Phone: [phone] Fax: [phone]     Social Determinants of Health (SDOH) Interventions    Readmission Risk Interventions No flowsheet data found.

## 2019-02-21 NOTE — Progress Notes (Signed)
PROGRESS NOTE    RAMEY BYUN   [id number]  DOB: 02-13-1950  DOA: 01/18/2019 PCP: Shon Baton, MD   Brief Narrative:  [name] 69 yo male smoker with A-fib on coumadin, HTN, tobacco abuse who was brought to the ED for a change in mental status and was found to be nonverbal in the ED and found to have an acute Lt MCA CVA. He received thrombolytics in the ED and he required intubation for airway protection.  Sputum culture grew out streptococcal pneumonia and Enterobacter cloacae and he was treated with antibiotics. He had difficulty weaning from vent and required tracheostomy and PEG tube.  12/10 tPA administered 12/11 intubated 12/21 Tracheostomy 12/22 PEG placed 01/04 patient pulled out trach >> left out  Subjective: Non verbal.      Assessment & Plan:   Principal Problem:   Left embolic stroke-history of CVA -With left gaze, right hemiparesis, right neglect, aphasia and acute confusion/ agitation -Treated with TPA 06/18/2018 -CT head in ED-no acute findings -CT head 01/19/2019: Interval demarcation of acute cortically based infarct within the left frontal lobe - CTA head and neck- Prominent soft plaque at the left ICA bulb. 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  -MRI 12/12: Multifocal acute ischemia within left MCA territory including the left postcentral gyrus-no hemorrhage or mass-effect-old right occipital and cerebellar infarcts-chronic ischemic microangiopathy -2D echo 12/11: EF 55 to 60%-no thrombus or embolus noted -LDL 182- Lipitor increased from 40 to 80 mg daily -Hemoglobin A1c 5.1 -Initially treated with heparin-he was on warfarin prior to admission and INR was 1.1 on admission -he is now on Eliquis since 12/26  Active Problems:  A. fib with RVR -Started on Amiodarone and Cardizem infusions, now on PO -Currently on metoprolol 100 mg twice daily, amiodarone 200 mg daily and  Cardizem,short acting 30 mg every 6 hours  Delirium-acute metabolic encephalopathy -Vitamin B12 level was 161-supplements have been started -Continues to be quite confused and mostly non-verbal -receiving Seroquel and Depakote - Depakote increased to 500 BID- Seroquel increased to 100 at bedtime and 50 mg in AM    Fever/ tachycardia/ leukocytosis 1/8 Enterobacter UTI 1/2 - recheck blood cultures, UA, Urine culture and CXR - CXR negative- likely due to UTI - see below - resumed Ceftriaxone 1/8- fevers resolved- the medication "fell off " the [facility]- d/w pharmacy - sepsis picture resolved- U culture still growing >=100,000 COLONIES/mL ENTEROBACTER CLOACAE - Cont ceftriaxone- reset schedule to day 1 being 1/8- stop date 1/14  Pulled PEG - replaced 1/9  Acute respiratory failure-hypoxic -Initially intubated for airway protection secondary to acute CVA -The patient also developed pneumonia with cultures growing out strep pneumonia and Enterobacter cloacae and was treated with cefepime -Underwent tracheostomy on 12/21 -He pulled his trach out on 1/4  Dysphagia severe protein calorie malnutrition -He continues to have a PEG tube -Continue Osmolite 474 mL 3 times daily -prostat 30 cc daily - refusing to eat/drink thus I will cont all food and meds via tube; today 1/13 RN says he is taking PO so will change some meds to PO  B12 and Iron deficiency anemia with macrocytosis -Feraheme given on 12/29 - Macrocytosis likely due to B12 deficiency and ETOH abuse  Thrombocytosis -started on Hydrea by Dr. Marin Olp on 12/28-JAK2 assay was negative -Platelet count improving after IV iron given therefore was secondary to IDA- Hydrea d/c'd 1/7    Tobacco abuse    Time spent  in minutes: 25 DVT prophylaxis: Eliquis Code Status: Full code Family Communication:  Disposition Plan: SNF is the long term plan- currently still having some trouble with agitation but not as severe as before- d/c antibiotics  on 1/14  Consultants:   PCCM admitted patient  Neurology  Hematology  Palliative care  General surgery  Procedures:  Transthoracic Echocardiogram  01/19/2019 IMPRESSIONS 1. Left ventricular ejection fraction, by visual estimation, is 55 to 60%. The left ventricle has normal function. There is moderately increased left ventricular hypertrophy. 2. The left ventricle demonstrates regional wall motion abnormalities. 3. Global right ventricle has normal systolic function.The right ventricular size is normal. No increase in right ventricular wall thickness. 4. Left atrial size was normal. 5. Right atrial size was mildly dilated. 6. The mitral valve is normal in structure. Mild to moderate mitral valve regurgitation. 7. The tricuspid valve is normal in structure. Tricuspid valve regurgitation is trivial. 8. The aortic valve is normal in structure. Aortic valve regurgitation is not visualized. 9. The pulmonic valve was normal in structure. Pulmonic valve regurgitation is not visualized. 10. The atrial septum is grossly normal.  Antimicrobials:  Anti-infectives (From admission, onward)   Start     Dose/Rate Route Frequency Ordered Stop   02/16/19 1045  cefTRIAXone (ROCEPHIN) 1 g in sodium chloride 0.9 % 100 mL IVPB     1 g 200 mL/hr over 30 Minutes Intravenous Every 24 hours 02/16/19 1042 02/22/19 2359   02/12/19 1800  cefTRIAXone (ROCEPHIN) 1 g in sodium chloride 0.9 % 100 mL IVPB     1 g 200 mL/hr over 30 Minutes Intravenous  Once 02/12/19 1348 02/13/19 0412   02/10/19 1430  cefTRIAXone (ROCEPHIN) 1 g in sodium chloride 0.9 % 100 mL IVPB     1 g 200 mL/hr over 30 Minutes Intravenous Every 24 hours 02/10/19 1347 02/11/19 1754   01/26/19 1000  ceFEPIme (MAXIPIME) 2 g in sodium chloride 0.9 % 100 mL IVPB     2 g 200 mL/hr over 30 Minutes Intravenous Every 8 hours 01/26/19 0955 02/02/19 0145   01/25/19 1000  Ampicillin-Sulbactam (UNASYN) 3 g in sodium chloride 0.9 % 100 mL  IVPB  Status:  Discontinued     3 g 200 mL/hr over 30 Minutes Intravenous Every 8 hours 01/25/19 0810 01/26/19 0955       Objective: Vitals:   02/21/19 0402 02/21/19 0500 02/21/19 0800 02/21/19 1142  BP: 126/85  137/90 127/81  Pulse:   68 86  Resp: 17  16 19   Temp: 98.3 F (36.8 C)  98.3 F (36.8 C) 97.7 F (36.5 C)  TempSrc: Oral  Oral Oral  SpO2: 100%  97% 98%  Weight:  66.5 kg    Height:        Intake/Output Summary (Last 24 hours) at 02/21/2019 1210 Last data filed at 02/21/2019 1018 Gross per 24 hour  Intake 240 ml  Output 900 ml  Net -660 ml   Filed Weights   02/18/19 0500 02/19/19 0500 02/21/19 0500  Weight: 67.7 kg 67 kg 66.5 kg    Examination: General exam: Appears restless, awake and continues to state "NO" HEENT: PERRLA, oral mucosa moist, no sclera icterus or thrush- tracheotomy scar noted Respiratory system: Clear to auscultation. Respiratory effort normal. Cardiovascular system: S1 & S2 heard,  No murmurs  Gastrointestinal system: Abdomen soft, non-tender, nondistended. Normal bowel sounds - PEG present with abdominal binder in plce  Neuro: right sided weakness, does not follow commands, mostly non-verbal Extremities: No  cyanosis, clubbing or edema Skin: No rashes or ulcers       Data Reviewed: I have personally reviewed following labs and imaging studies  CBC: Recent Labs  Lab 02/16/19 0905 02/17/19 0649 02/18/19 0116 02/19/19 0611 02/20/19 0713  WBC 17.4* 20.0* 15.3* 9.4 7.9  NEUTROABS 15.0* 15.5* 12.0* 6.4 4.7  HGB 14.1 13.8 13.8 13.5 13.5  HCT 41.9 40.7 41.7 39.8 41.1  MCV 101.2* 100.7* 101.5* 100.5* 102.8*  PLT 342 304 267 244 [id number]   Basic Metabolic Panel: Recent Labs  Lab 02/16/19 0905 02/17/19 0649 02/18/19 0116  NA 135 131* 133*  K 4.4 4.2 3.9  CL 96* 93* 96*  CO2 26 26 25   GLUCOSE 88 98 134*  BUN 23 21 16   CREATININE 0.87 0.78 0.76  CALCIUM 9.9 9.5 9.3   GFR: Estimated Creatinine Clearance: 82 mL/min (by C-G formula  based on SCr of 0.76 mg/dL). Liver Function Tests: No results for input(s): AST, ALT, ALKPHOS, BILITOT, PROT, ALBUMIN in the last 168 hours. No results for input(s): LIPASE, AMYLASE in the last 168 hours. No results for input(s): AMMONIA in the last 168 hours. Coagulation Profile: No results for input(s): INR, PROTIME in the last 168 hours. Cardiac Enzymes: No results for input(s): CKTOTAL, CKMB, CKMBINDEX, TROPONINI in the last 168 hours. BNP (last 3 results) No results for input(s): PROBNP in the last 8760 hours. HbA1C: No results for input(s): HGBA1C in the last 72 hours. CBG: Recent Labs  Lab 02/14/19 1548 02/14/19 2029 02/14/19 2350 02/15/19 0438 02/19/19 2059  GLUCAP 76 111* 125* 83 72   Lipid Profile: No results for input(s): CHOL, HDL, LDLCALC, TRIG, CHOLHDL, LDLDIRECT in the last 72 hours. Thyroid Function Tests: No results for input(s): TSH, T4TOTAL, FREET4, T3FREE, THYROIDAB in the last 72 hours. Anemia Panel: No results for input(s): VITAMINB12, FOLATE, FERRITIN, TIBC, IRON, RETICCTPCT in the last 72 hours. Urine analysis:    Component Value Date/Time   COLORURINE YELLOW 02/16/2019 1345   APPEARANCEUR HAZY (A) 02/16/2019 1345   LABSPEC 1.012 02/16/2019 1345   PHURINE 7.0 02/16/2019 1345   GLUCOSEU NEGATIVE 02/16/2019 1345   GLUCOSEU NEGATIVE 12/13/2012 1514   HGBUR NEGATIVE 02/16/2019 1345   BILIRUBINUR NEGATIVE 02/16/2019 1345   KETONESUR NEGATIVE 02/16/2019 1345   PROTEINUR NEGATIVE 02/16/2019 1345   UROBILINOGEN 0.2 12/13/2012 1514   NITRITE POSITIVE (A) 02/16/2019 1345   LEUKOCYTESUR LARGE (A) 02/16/2019 1345   Sepsis Labs: @LABRCNTIP (procalcitonin:4,lacticidven:4) ) Recent Results (from the past 240 hour(s))  Culture, blood (routine x 2)     Status: None   Collection Time: 02/16/19 12:00 PM   Specimen: BLOOD  Result Value Ref Range Status   Specimen Description BLOOD RIGHT ANTECUBITAL  Final   Special Requests   Final    BOTTLES DRAWN AEROBIC  ONLY Blood Culture adequate volume   Culture   Final    NO GROWTH 5 DAYS Performed at [facility], [place] [address] [place], [place] [address]    Report Status 02/21/2019 FINAL  Final  Culture, blood (routine x 2)     Status: None   Collection Time: 02/16/19 12:26 PM   Specimen: BLOOD RIGHT ARM  Result Value Ref Range Status   Specimen Description BLOOD RIGHT ARM  Final   Special Requests   Final    BOTTLES DRAWN AEROBIC ONLY Blood Culture adequate volume   Culture   Final    NO GROWTH 5 DAYS Performed at [facility], [place],  [place] [address]    Report Status 02/21/2019 FINAL  Final  Culture, Urine     Status: Abnormal   Collection Time: 02/16/19  1:20 PM   Specimen: Urine, Catheterized  Result Value Ref Range Status   Specimen Description URINE, CATHETERIZED  Final   Special Requests   Final    NONE Performed at [facility], [place] [address], [place], [place] [address]    Culture >=100,000 COLONIES/mL ENTEROBACTER CLOACAE (A)  Final   Report Status 02/19/2019 FINAL  Final   Organism ID, Bacteria ENTEROBACTER CLOACAE (A)  Final      Susceptibility   Enterobacter cloacae - MIC*    CEFAZOLIN >=64 RESISTANT Resistant     CIPROFLOXACIN <=0.25 SENSITIVE Sensitive     GENTAMICIN <=1 SENSITIVE Sensitive     IMIPENEM <=0.25 SENSITIVE Sensitive     NITROFURANTOIN <=16 SENSITIVE Sensitive     TRIMETH/SULFA <=20 SENSITIVE Sensitive     PIP/TAZO >=128 RESISTANT Resistant     * >=100,000 COLONIES/mL ENTEROBACTER CLOACAE         Radiology Studies: DG Swallowing Func-Speech Pathology  Result Date: 02/21/2019 Objective Swallowing Evaluation: Type of Study: MBS-Modified Barium Swallow Study  Patient Details Name: MACARTHUR NIZIOLEK MRN: [id number] Date of Birth: 1950-05-02 Today's Date: 02/21/2019 Time: SLP Start Time (ACUTE ONLY): 0808 -SLP Stop Time (ACUTE ONLY): 0830 SLP Time Calculation (min) (ACUTE ONLY): 22 min Past Medical History: Past Medical  History: Diagnosis Date . AAA (abdominal aortic aneurysm) ([place])   per last CT 03-26-2016---  3.9cm . Anticoagulated on Coumadin  . Arthritis of spine  . Benign localized prostatic hyperplasia with lower urinary tract symptoms (LUTS)  . Chronic atrial fibrillation ([place]) cardiolgoist -- dr Stanford Breed  dx 2009 . Complication of anesthesia   per pt today (07-08-2016) had problem after having anesthesia 12/ 2016 surgery at [facility] "not good" went to ED ( in epic ED visit 01/ 2017 dx severe episode of major depression disorder and admitted to [facility] . COPD with emphysema ([place])  . Dyspnea on exertion  . Full dentures  . GERD (gastroesophageal reflux disease)  . Hematuria  . History of amaurosis fugax   right eye 02-08-2015 -- resolved /  documented in epic possible left eye amaurosis fugax 11/ 2008 (pt denies) / per last MRI show previous bilateral occipital lobe infarcts . History of concussion   as child . History of CVA (cerebrovascular accident) 10/ 2016  and per pt Dec 2017 in [place]  neurologist-  dr Leonie Man-- per note silent infarcts on MRI-- multiple acute infarcts bilateral cerebullm, right temporal lobe, and bilateral occipital lobes due to embolic phenomena (atrial fib.) . History of seizures as a child  . Hyperlipidemia  . Hypertension  . Left arm numbness   occasional numbess post residual axilla arterial occlusion and surgery . Major depressive disorder   hx severe episode without psychosis 02/2015 w/ homicidal ideation . Narcotic dependence ([place])  . PAD (peripheral artery disease) ([place])   followed by dr fields-- s/p  left axilla to branchial and left axilla to ulnar bypass graft due to ischemic hand . Pulmonary nodule   per CT 03-26-2016  right lower lobe . PVD (peripheral vascular disease) ([place])  . Right ureteral stone  . Senile purpura ([place])  . Weakness of left arm   residual from axilla arterial occlusion and post surgery . Wears glasses  Past Surgical History: Past Surgical History: Procedure Laterality Date  . ARCH AORTOGRAM N/A 04/05/2014  Procedure: ARCH AORTOGRAM, FIRST  ORDER CATHETERIZATION LEFT SUBCLAVIAN ARTERY;  Surgeon: Elam Dutch, MD;  Location: [facility] OR;  Service: Vascular;  Laterality: N/A; . AXILLARY-FEMORAL BYPASS GRAFT Left 04/08/2014  Procedure: LEFT AXILLARY ARTERY TO BRACHIAL ARTERY BYPASS USING NON REVERSE LEFT GREATER SAPHENOUS VEIN ,LIGATION OF LEFT AXILLARY ARTERY ANEURYSM;  Surgeon: Elam Dutch, MD;  Location: [place];  Service: Vascular;  Laterality: Left; . BYPASS AXILLA/BRACHIAL ARTERY Left 01/27/2015  Procedure: LEFT BRACHIAL-ULAR ARTERY BYPASS USING GREATER SAPHENOUS VEIN;  Surgeon: Elam Dutch, MD;  Location: [facility];  Service: Vascular;  Laterality: Left; . CYSTOSCOPY WITH RETROGRADE PYELOGRAM, URETEROSCOPY AND STENT PLACEMENT Right 07/17/2016  Procedure: CYSTOSCOPY WITH RETROGRADE PYELOGRAM, URETEROSCOPY AND STENT PLACEMENT,LASER;  Surgeon: Festus Aloe, MD;  Location: [facility];  Service: Urology;  Laterality: Right; . CYSTOSCOPY/URETEROSCOPY/HOLMIUM LASER/STENT PLACEMENT Right 07/13/2016  Procedure: CYSTOSCOPY, RIGHT URETEROSCOPY RETROGRADE PYELOGRAM;  Surgeon: Kathie Rhodes, MD;  Location[facility];  Service: Urology;  Laterality: Right; . EMBOLECTOMY Left 04/05/2014  Procedure: THROMBO ENDARTERECTOMY OF LEFT BRACHIAL, RADIAL AND ULNAR ARTERY,  Left Radial artery cut down and radial artery thrombectomy.;  Surgeon: Elam Dutch, MD;  Location: [facility] OR;  Service: Vascular;  Laterality: Left; . ESOPHAGOGASTRODUODENOSCOPY N/A 01/30/2019  Procedure: ESOPHAGOGASTRODUODENOSCOPY (EGD);  Surgeon: Jesusita Oka, MD;  Location: [facility] ENDOSCOPY;  Service: General;  Laterality: N/A; . facial cyst Right   cyst removal.  . IR REPLC GASTRO/COLONIC TUBE PERCUT W/FLUORO  02/17/2019 . PATCH ANGIOPLASTY Left 04/05/2014  Procedure: LEFT ARM VEIN PATCH ANGIOPLASTY;  Surgeon: Elam Dutch, MD;  Location: [facility];  Service: Vascular;  Laterality: Left; . PEG PLACEMENT N/A 01/30/2019   Procedure: PERCUTANEOUS ENDOSCOPIC GASTROSTOMY (PEG) PLACEMENT;  Surgeon: Jesusita Oka, MD;  Location: [place];  Service: General;  Laterality: N/A; . PERIPHERAL VASCULAR CATHETERIZATION N/A 01/22/2015  Procedure: Aortic Arch Angiography;  Surgeon: Elam Dutch, MD;  Location: [facility];  Service: Cardiovascular;  Laterality: N/A; . THROMBECTOMY BRACHIAL ARTERY Left 11/20/2014  Procedure: 1.  Thrombectomy Left Axilo-Brachial Bypass  2.  Thromboendarterecotmy of Left Brachial Artery with Fogarty Thrombectomy of Radial and Ulnar Arteries with Dacron patch angioplasty Left Brachial Artery. 3. Intraoperative  Arteriogram times four.;  Surgeon: Mal Misty, MD;  Location: [facility];  Service: Vascular;  Laterality: Left; . TRANSTHORACIC ECHOCARDIOGRAM  11/25/2014  moderate concentric LVH, ef 60-65%, unable to evaluation LVDF due to atrial fib/  mild MR/  severe LAE . VEIN HARVEST Left 04/08/2014  Procedure: LEFT GREATER SAPHENOUS VEIN HARVEST;  Surgeon: Elam Dutch, MD;  Location: [facility];  Service: Vascular;  Laterality: Left; Marland Kitchen VEIN HARVEST Right 01/27/2015  Procedure: RIGHT GREATER SAPHENOUS VEIN HARVEST;  Surgeon: Elam Dutch, MD;  Location: [facility];  Service: Vascular;  Laterality: Right; HPI: Pt is a 69 y/o M admitted with L MCA CVA s/p TPA. He was intubated 12/11-12/13, reintubated 12/13; trach 12/21 (decannulated 1/5). PMH: tobacco use, previous strokes, GERD, COPD, AAA, PVD, HTN, HLD, pulmonary nodule, depression, afib (recently missed doses of coumadin)  Subjective: pt alert, aphasic Assessment / Plan / Recommendation CHL IP CLINICAL IMPRESSIONS 02/21/2019 Clinical Impression Pt presents with a mild-moderate oropharyngeal dysphagia. He has delayed oral transit and mild oral residuals. He has impaired timing or swallow initiation as well as reduced hyolaryngeal movement, epiglottic inversion, and LVC, although his pharyngeal squeeze is good. This results in trace to no residue in the  pharynx but he does have frequent penetration of thin and nectar thick liquids. He is not able to  clear this penetration and only intermittent coughs to command. As the penetrates remain, they eventually fall below the vocal folds and are silently aspirated. When challenged with larger boluses he has more immediate silent aspiration. Recommend Dys 1 diet and honey thick liquids for now with additional SLP f/u and potential pharyngeal exercises as cognitive-linguistic status allows.  SLP Visit Diagnosis Dysphagia, oropharyngeal phase (R13.12) Attention and concentration deficit following -- Frontal lobe and executive function deficit following -- Impact on safety and function Moderate aspiration risk   CHL IP TREATMENT RECOMMENDATION 02/21/2019 Treatment Recommendations Therapy as outlined in treatment plan below   Prognosis 02/21/2019 Prognosis for Safe Diet Advancement Good Barriers to Reach Goals Language deficits Barriers/Prognosis Comment -- CHL IP DIET RECOMMENDATION 02/21/2019 SLP Diet Recommendations Dysphagia 1 (Puree) solids;Honey thick liquids Liquid Administration via Cup;Straw Medication Administration Via alternative means Compensations Minimize environmental distractions;Slow rate;Small sips/bites;Lingual sweep for clearance of pocketing Postural Changes Seated upright at 90 degrees   CHL IP OTHER RECOMMENDATIONS 02/21/2019 Recommended Consults -- Oral Care Recommendations Oral care BID Other Recommendations --   CHL IP FOLLOW UP RECOMMENDATIONS 02/21/2019 Follow up Recommendations Skilled Nursing facility   [facility] IP FREQUENCY AND DURATION 02/21/2019 Speech Therapy Frequency (ACUTE ONLY) min 2x/week Treatment Duration 2 weeks      CHL IP ORAL PHASE 02/21/2019 Oral Phase Impaired Oral - Pudding Teaspoon -- Oral - Pudding Cup -- Oral - Honey Teaspoon -- Oral - Honey Cup Delayed oral transit;Lingual/palatal residue Oral - Nectar Teaspoon -- Oral - Nectar Cup Delayed oral transit;Lingual/palatal residue Oral -  Nectar Straw Delayed oral transit;Lingual/palatal residue Oral - Thin Teaspoon Delayed oral transit;Lingual/palatal residue Oral - Thin Cup Delayed oral transit;Lingual/palatal residue Oral - Thin Straw Delayed oral transit;Lingual/palatal residue Oral - Puree Delayed oral transit;Lingual/palatal residue Oral - Mech Soft -- Oral - Regular -- Oral - Multi-Consistency -- Oral - Pill -- Oral Phase - Comment Delayed oral transit;Lingual/palatal residue  CHL IP PHARYNGEAL PHASE 02/21/2019 Pharyngeal Phase Impaired Pharyngeal- Pudding Teaspoon -- Pharyngeal -- Pharyngeal- Pudding Cup -- Pharyngeal -- Pharyngeal- Honey Teaspoon -- Pharyngeal -- Pharyngeal- Honey Cup Reduced epiglottic inversion;Reduced anterior laryngeal mobility;Reduced laryngeal elevation;Reduced airway/laryngeal closure Pharyngeal -- Pharyngeal- Nectar Teaspoon -- Pharyngeal -- Pharyngeal- Nectar Cup Reduced epiglottic inversion;Reduced anterior laryngeal mobility;Reduced laryngeal elevation;Reduced airway/laryngeal closure;Penetration/Aspiration during swallow Pharyngeal Material enters airway, passes BELOW cords without attempt by patient to eject out (silent aspiration) Pharyngeal- Nectar Straw Reduced epiglottic inversion;Reduced anterior laryngeal mobility;Reduced laryngeal elevation;Reduced airway/laryngeal closure;Penetration/Aspiration during swallow Pharyngeal Material enters airway, passes BELOW cords without attempt by patient to eject out (silent aspiration) Pharyngeal- Thin Teaspoon Reduced epiglottic inversion;Reduced anterior laryngeal mobility;Reduced laryngeal elevation;Reduced airway/laryngeal closure;Penetration/Aspiration during swallow Pharyngeal Material enters airway, remains ABOVE vocal cords and not ejected out Pharyngeal- Thin Cup Reduced epiglottic inversion;Reduced anterior laryngeal mobility;Reduced laryngeal elevation;Reduced airway/laryngeal closure;Penetration/Aspiration during swallow Pharyngeal Material enters airway,  passes BELOW cords without attempt by patient to eject out (silent aspiration) Pharyngeal- Thin Straw Reduced epiglottic inversion;Reduced anterior laryngeal mobility;Reduced laryngeal elevation;Reduced airway/laryngeal closure;Penetration/Aspiration during swallow Pharyngeal Material enters airway, passes BELOW cords without attempt by patient to eject out (silent aspiration) Pharyngeal- Puree -- Pharyngeal -- Pharyngeal- Mechanical Soft -- Pharyngeal -- Pharyngeal- Regular -- Pharyngeal -- Pharyngeal- Multi-consistency -- Pharyngeal -- Pharyngeal- Pill -- Pharyngeal -- Pharyngeal Comment --  CHL IP CERVICAL ESOPHAGEAL PHASE 02/21/2019 Cervical Esophageal Phase WFL Pudding Teaspoon -- Pudding Cup -- Honey Teaspoon -- Honey Cup -- Nectar Teaspoon -- Nectar Cup -- Nectar Straw -- Thin Teaspoon -- Thin Cup -- Thin Straw -- Puree --  Mechanical Soft -- Regular -- Multi-consistency -- Pill -- Cervical Esophageal Comment -- Osie Bond., M.A. CCC-SLP Acute Rehabilitation Services Pager [phone] Office [phone] 02/21/2019, 9:17 AM                 Scheduled Meds: . amiodarone  200 mg Per Tube Daily  . apixaban  5 mg Per Tube BID  . atorvastatin  80 mg Per Tube q1800  . chlorhexidine  15 mL Mouth Rinse BID  . diltiazem  30 mg Per Tube Q6H  . divalproex  500 mg Oral Q12H  . feeding supplement (OSMOLITE 1.5 CAL)  474 mL Per Tube TID  . feeding supplement (PRO-STAT SUGAR FREE 64)  30 mL Per Tube BID  . folic acid  2 mg Oral Daily  . mouth rinse  15 mL Mouth Rinse q12n4p  . metoprolol tartrate  100 mg Per Tube BID  . QUEtiapine  150 mg Oral QHS  . QUEtiapine  50 mg Per Tube Daily  . sennosides  5 mL Per Tube BID  . vitamin B-12  1,000 mcg Per Tube Daily   Continuous Infusions: . sodium chloride 10 mL/hr at 02/01/19 0541  . cefTRIAXone (ROCEPHIN)  IV 1 g (02/20/19 0946)     LOS: 34 days      Beyounce Dickens Marry Guan, MD [facility] Pager: [email or web address] Password [facility] 02/21/2019, 12:10  PM

## 2019-02-21 NOTE — Progress Notes (Signed)
Modified Barium Swallow Progress Note  Patient Details  Name: [name] MRN: [id number] Date of Birth: February 21, 1950  Today's Date: 02/21/2019  Modified Barium Swallow completed.  Full report located under Chart Review in the Imaging Section.  Brief recommendations include the following:  Clinical Impression  Pt presents with a mild-moderate oropharyngeal dysphagia. He has delayed oral transit and mild oral residuals. He has impaired timing for swallow initiation as well as reduced hyolaryngeal movement, epiglottic inversion, and LVC, although his pharyngeal squeeze is good. This results in trace to no residue in the pharynx but he does have frequent penetration of thin and nectar thick liquids. He is not able to clear this penetration and only intermittently coughs to command. As the penetrates sit in his laryngeal vestibule, they eventually fall below the vocal folds and are silently aspirated. When challenged with larger boluses he has more immediate silent aspiration. Recommend Dys 1 diet and honey thick liquids for now with additional SLP f/u and potential pharyngeal exercises (especially for LVC) as cognitive-linguistic status allows.    Swallow Evaluation Recommendations       SLP Diet Recommendations: Dysphagia 1 (Puree) solids;Honey thick liquids   Liquid Administration via: Cup;Straw   Medication Administration: Via alternative means(could crush with puree)   Supervision: Staff to assist with self feeding;Full supervision/cueing for compensatory strategies   Compensations: Minimize environmental distractions;Slow rate;Small sips/bites;Lingual sweep for clearance of pocketing   Postural Changes: Seated upright at 90 degrees   Oral Care Recommendations: Oral care BID         Osie Bond., M.A. [place] Pager [phone] Office [phone]  02/21/2019,9:14 AM

## 2019-02-21 NOTE — Progress Notes (Signed)
Occupational Therapy Treatment Patient Details Name: [name] MRN: [id number] DOB: 07-14-1950 Today's Date: 02/21/2019    History of present illness 75yM admitted 01/20/19 with hypertensive emergency in setting of acute L MCA stroke s/p tPA, AF/RVR.  Has been noncompliant with his warfarin for A. Fib. Intubated for respiratory failure. Trach 01/29/19 with PEG planned for 01/30/19. No sedation at time of PT eval.    OT comments  Pt less attentive today and BP elevated.  Pt unable to comb hair with either hand. Pt resisting hand over hand and LUE unable to. Unsure of apraxia or decreased attention to task as pt able to comb hair last session. Pt minA +2 for stability with mobility. BP very high and HR tachy RN aware. BP: 152/112 (107) and taken again after resting 2 mins 152/107 (117). Pt reported dizziness. Pt would benefit from continued OT skilled services for ADL, mobility and safety in SNF setting. OT following.    Follow Up Recommendations  SNF;Supervision/Assistance - 24 hour    Equipment Recommendations  Other (comment)(to be determined)    Recommendations for Other Services      Precautions / Restrictions Precautions Precautions: Fall Restrictions Weight Bearing Restrictions: No       Mobility Bed Mobility Overal bed mobility: Needs Assistance Bed Mobility: Supine to Sit     Supine to sit: Min assist;HOB elevated     General bed mobility comments: MinA for R side weakness  Transfers Overall transfer level: Needs assistance Equipment used: Rolling walker (2 wheeled) Transfers: Sit to/from Stand Sit to Stand: +2 physical assistance;Min assist Stand pivot transfers: +2 physical assistance;Min assist       General transfer comment: Min assist for power up, steadying, proper hand placement on RW especially RUE.    Balance Overall balance assessment: Needs assistance Sitting-balance support: Feet supported Sitting balance-Leahy Scale: Fair Sitting  balance - Comments: Pr redirected after leaning forward onto knees staring at ground   Standing balance support: Single extremity supported;During functional activity;Bilateral upper extremity supported Standing balance-Leahy Scale: Poor Standing balance comment: reliant on external support. Sustained standing x5 minutes with dynamic  ADL tasks at sink, limited by fatigue                           ADL either performed or assessed with clinical judgement   ADL Overall ADL's : Needs assistance/impaired Eating/Feeding: NPO   Grooming: Moderate assistance;Standing Grooming Details (indicate cue type and reason): Pt unable to comb hair with either hand. Pt resisting hand over hand and LUE unable to. Unsure of apraxia or decreased attention to task as pt able to comb hair last session.                             Functional mobility during ADLs: Minimal assistance;+2 for physical assistance;+2 for safety/equipment;Rolling walker;Cueing for safety;Cueing for sequencing General ADL Comments: Pt modA to [place]; limited by poor attention, decreased ability to care for self and decreased awareness of deficits.     Vision   Vision Assessment?: Vision impaired- to be further tested in functional context Additional Comments: Pt looking in mirror for first time and touching the mirror. Pt requiring constant cues to attend to task at hand.   Perception     Praxis      Cognition Arousal/Alertness: Awake/alert Behavior During Therapy: Flat affect Overall Cognitive Status: Impaired/Different from baseline Area of Impairment: Orientation;Attention;Following commands;Safety/judgement;Awareness;Problem solving  Orientation Level: Place;Time;Situation Current Attention Level: Focused   Following Commands: Follows one step commands with increased time;Follows one step commands inconsistently Safety/Judgement: Decreased awareness of safety;Decreased  awareness of deficits Awareness: Intellectual Problem Solving: Slow processing;Decreased initiation;Difficulty sequencing;Requires verbal cues;Requires tactile cues General Comments: pt mostly responding yes/no, pt perseverating on adjusting socks once donned and difficult to redirect. Pt attempting to speak, but unintelligible at times        Exercises Exercises: Other exercises Other Exercises Other Exercises: PROM to RUE shoulder through digits; supported in bed   Shoulder Instructions       General Comments BP very high and HR tachy RN aware. BP: 152/112 (107) and taken again after resting 2 mins 152/107 (117). Pt reported dizziness.    Pertinent Vitals/ Pain       Pain Assessment: No/denies pain Faces Pain Scale: No hurt Pain Intervention(s): Monitored during session  Home Living                                          Prior Functioning/Environment              Frequency  Min 1X/week        Progress Toward Goals  OT Goals(current goals can now be found in the care plan section)  Progress towards OT goals: Progressing toward goals  Acute Rehab OT Goals Patient Stated Goal: none stated OT Goal Formulation: Patient unable to participate in goal setting Time For Goal Achievement: 03/07/19 Potential to Achieve Goals: Fair ADL Goals Pt Will Perform Eating: with set-up;with adaptive utensils;sitting Pt Will Perform Grooming: with min assist;sitting Pt Will Perform Upper Body Dressing: with min assist;sitting Pt Will Transfer to Toilet: with mod assist;stand pivot transfer;bedside commode Pt/caregiver will Perform Home Exercise Program: Right Upper extremity;Increased strength;Increased ROM;With minimal assist Additional ADL Goal #1: pt will follow 75% of commands in 5/5 trials  Plan Discharge plan remains appropriate;Frequency needs to be updated    Co-evaluation    PT/OT/SLP Co-Evaluation/Treatment: Yes Reason for Co-Treatment: Complexity  of the patient's impairments (multi-system involvement);For patient/therapist safety PT goals addressed during session: Mobility/safety with mobility;Balance OT goals addressed during session: ADL's and self-care      AM-PAC OT "6 Clicks" Daily Activity     Outcome Measure   Help from another person eating meals?: Total Help from another person taking care of personal grooming?: A Lot Help from another person toileting, which includes using toliet, bedpan, or urinal?: Total Help from another person bathing (including washing, rinsing, drying)?: Total Help from another person to put on and taking off regular upper body clothing?: A Lot Help from another person to put on and taking off regular lower body clothing?: Total 6 Click Score: 8    End of Session    OT Visit Diagnosis: Unsteadiness on feet (R26.81);Muscle weakness (generalized) (M62.81);Cognitive communication deficit (R41.841) Symptoms and signs involving cognitive functions: Cerebral infarction   Activity Tolerance Patient tolerated treatment well   Patient Left in bed;with call bell/phone within reach;with bed alarm set;with nursing/sitter in room   Nurse Communication Mobility status        Time: 1114-1140 OT Time Calculation (min): 26 min  Charges: OT General Charges $OT Visit: 1 Visit OT Treatments $Neuromuscular Re-education: 8-22 mins  Jefferey Pica OTR/L Acute Rehabilitation Services Pager: [phone] Office: [phone]    Alfhild Partch C 02/21/2019, 4:27 PM

## 2019-02-21 NOTE — Progress Notes (Signed)
Physical Therapy Treatment Patient Details Name: [name] MRN: [id number] DOB: [birth year] Today's Date: 02/21/2019    History of Present Illness 71yM admitted 01/20/19 with hypertensive emergency in setting of acute L MCA stroke s/p tPA, AF/RVR.  Has been noncompliant with his warfarin for A. Fib. Intubated for respiratory failure. Trach 01/29/19 with PEG planned for 01/30/19. No sedation at time of PT eval.     PT Comments    Pt states "yes" to feeling tired today, and pt somewhat restless this session vs last. Pt ambulated to and from bathroom this session, and participated in ADL tasks at sink with min-mod assist for sustained standing balance. Pt with HR elevation to 141 bpm during short distance ambulation, and BP up to 154/124 per reading, RN notified. Pt up in chair with sitter present. PT to continue to follow acutely, will progress mobility as able.    Follow Up Recommendations  SNF;Supervision/Assistance - 24 hour     Equipment Recommendations  Other (comment)(TBD at next venue)    Recommendations for Other Services       Precautions / Restrictions Precautions Precautions: Fall Restrictions Weight Bearing Restrictions: No    Mobility  Bed Mobility Overal bed mobility: Needs Assistance Bed Mobility: Supine to Sit     Supine to sit: Min assist;HOB elevated     General bed mobility comments: min assist for LE and trunk management, scooting to EOB.  Transfers Overall transfer level: Needs assistance Equipment used: Rolling walker (2 wheeled) Transfers: Sit to/from Stand Sit to Stand: +2 physical assistance;Min assist Stand pivot transfers: +2 physical assistance;Min assist       General transfer comment: Min assist for power up, steadying, proper hand placement on RW especially RUE.  Ambulation/Gait Ambulation/Gait assistance: Mod assist;+2 safety/equipment Gait Distance (Feet): 25 Feet Assistive device: Rolling walker (2 wheeled) Gait  Pattern/deviations: Step-through pattern;Decreased stride length;Trunk flexed;Narrow base of support;Shuffle Gait velocity: decr   General Gait Details: Mod assist +2 for steadying, navigation of RW, placement in RW. Max multimodal cuing for safety and directions on where to walk. Pt walked to and from bathroom, unable to progress further due to tachycardia up to 141 bpm and elevated BP (150s/107, then 150s/124 RN notified)   Stairs             Wheelchair Mobility    Modified Rankin (Stroke Patients Only) Modified Rankin (Stroke Patients Only) Pre-Morbid Rankin Score: No symptoms Modified Rankin: Moderately severe disability     Balance Overall balance assessment: Needs assistance Sitting-balance support: Feet supported Sitting balance-Leahy Scale: Fair Sitting balance - Comments: pt min guard as pt tends to lean very far forward and rests head on arms across kness   Standing balance support: Single extremity supported;During functional activity;Bilateral upper extremity supported Standing balance-Leahy Scale: Poor Standing balance comment: reliant on external support. Sustained standing x5 minutes with dynamic grooming tasks at sink, limited by fatigue                            Cognition Arousal/Alertness: Awake/alert Behavior During Therapy: Flat affect Overall Cognitive Status: Impaired/Different from baseline Area of Impairment: Orientation;Attention;Following commands;Safety/judgement;Awareness;Problem solving                 Orientation Level: Place;Time;Situation(able to recall [facility] once re-oriented) Current Attention Level: Focused   Following Commands: Follows one step commands with increased time;Follows one step commands inconsistently Safety/Judgement: Decreased awareness of safety;Decreased awareness of deficits Awareness: Intellectual Problem Solving: Slow  processing;Decreased initiation;Difficulty sequencing;Requires verbal cues;Requires  tactile cues General Comments: pt mostly responding yes/no, pt perseverating on adjusting socks once donned and difficult to redirect. Pt with restlessness, verging on agitation when PT did not understand what pt was trying to say      Exercises      General Comments        Pertinent Vitals/Pain Pain Assessment: No/denies pain Faces Pain Scale: No hurt Pain Intervention(s): Monitored during session    Home Living                      Prior Function            PT Goals (current goals can now be found in the care plan section) Acute Rehab PT Goals Patient Stated Goal: none stated PT Goal Formulation: With patient Time For Goal Achievement: 02/27/19 Potential to Achieve Goals: Fair Progress towards PT goals: Progressing toward goals    Frequency    Min 3X/week      PT Plan Current plan remains appropriate    Co-evaluation PT/OT/SLP Co-Evaluation/Treatment: Yes Reason for Co-Treatment: Complexity of the patient's impairments (multi-system involvement);For patient/therapist safety PT goals addressed during session: Mobility/safety with mobility;Balance        AM-PAC PT "6 Clicks" Mobility   Outcome Measure  Help needed turning from your back to your side while in a flat bed without using bedrails?: A Little Help needed moving from lying on your back to sitting on the side of a flat bed without using bedrails?: A Little Help needed moving to and from a bed to a chair (including a wheelchair)?: A Lot Help needed standing up from a chair using your arms (e.g., wheelchair or bedside chair)?: A Lot Help needed to walk in hospital room?: A Lot Help needed climbing 3-5 steps with a railing? : Total 6 Click Score: 13    End of Session Equipment Utilized During Treatment: Gait belt Activity Tolerance: Patient tolerated treatment well;Patient limited by fatigue Patient left: in chair;with call bell/phone within reach;with chair alarm set;with nursing/sitter in  room Nurse Communication: Mobility status;Other (comment)(HTN) PT Visit Diagnosis: Hemiplegia and hemiparesis;Other abnormalities of gait and mobility (R26.89);Other symptoms and signs involving the nervous system (R29.898) Hemiplegia - Right/Left: Right Hemiplegia - dominant/non-dominant: Dominant Hemiplegia - caused by: Cerebral infarction     Time: [id number] PT Time Calculation (min) (ACUTE ONLY): 26 min  Charges:  $Gait Training: 8-22 mins                     Idell Hissong E, PT Acute Rehabilitation Services Pager [phone]  Office [phone]    Riannon Mukherjee D Elonda Husky 02/21/2019, 12:41 PM

## 2019-02-22 MED ORDER — OSMOLITE 1.5 CAL PO LIQD
474.0000 mL | Freq: Three times a day (TID) | ORAL | Status: DC
  Filled 2019-02-22 (×16): qty 474

## 2019-02-22 NOTE — Progress Notes (Signed)
  Speech Language Pathology Treatment: Dysphagia  Patient Details Name: [name] MRN: [id number] DOB: 19-Aug-1950 Today's Date: 02/22/2019 Time: [id number] SLP Time Calculation (min) (ACUTE ONLY): 25 min  Assessment / Plan / Recommendation Clinical Impression  Patient observed with a snack of pudding and honey thickened juice. Clear vocal quality at baseline and throughout intake. No clearing or coughing noted. CNA reported pt tolerated breakfast with 100% intake. Continues to need full assistance with meals. Pt had just received bath and became sleepy during session, unable to participate in structure language activities.    HPI HPI: Pt is a 69 y/o M admitted with L MCA CVA s/p TPA. He was intubated 12/11-12/13, reintubated 12/13; trach 12/21 (decannulated 1/5). PMH: tobacco use, previous strokes, GERD, COPD, AAA, PVD, HTN, HLD, pulmonary nodule, depression, afib (recently missed doses of coumadin). MBS completed 02/21/19 mild to moderate oropharyngeal dysphagia,  rec. Dys 1, honey thickened liquids.       SLP Plan  Continue with current plan of care       Recommendations  Diet recommendations: Dysphagia 1 (puree);Honey-thick liquid Liquids provided via: Cup Medication Administration: Crushed with puree Supervision: Trained caregiver to feed patient Compensations: Minimize environmental distractions;Slow rate;Small sips/bites;Lingual sweep for clearance of pocketing Postural Changes and/or Swallow Maneuvers: Seated upright 90 degrees                Oral Care Recommendations: Oral care QID Follow up Recommendations: Skilled Nursing facility SLP Visit Diagnosis: Dysphagia, oropharyngeal phase (R13.12) Plan: Continue with current plan of care       GO                Wynelle Bourgeois., MA, CCC-SLP 02/22/2019, 10:50 AM

## 2019-02-22 NOTE — Progress Notes (Signed)
Nutrition Follow-up  DOCUMENTATION CODES:   Severe malnutrition in context of chronic illness  INTERVENTION:  Let pt attempt to eat at meals,  If po intake at that meal is <50%, administer a bolus tube feeding using Osmolite 1.5 formula via PEG at volume of 474 ml (2 cartons/ARCS) given up to 3 times daily.   Continue 30 ml Prostat BID per tube.   If all three bolus feeds are given, tube feeding regimen to provide 2333 kcal (100% of needs), 119 grams of protein, and 1081 ml of free water.  Encourage adequate PO intake.   NUTRITION DIAGNOSIS:   Severe Malnutrition related to chronic illness(COPD) as evidenced by severe fat depletion, severe muscle depletion; ongoing  GOAL:   Patient will meet greater than or equal to 90% of their needs; met  MONITOR:   TF tolerance, Weight trends, Skin, Labs, I & O's  REASON FOR ASSESSMENT:   Consult, Ventilator Enteral/tube feeding initiation and management  ASSESSMENT:   69 yo [name] admitted with hypertensive emergency in setting of acute L MCA stroke, required intubation for airway protection, pt choking on own secretions. PMH includes COPD, AF, CVA, PAD.  12/21 S/P tracheostomy 12/22 PEG placed 1/4 Trach pulled out and left out 1/9 PEG pulled and was replaced by IR  1/13 Diet advanced to dysphagia 1 diet with honey thick liquids.  Meal completion has been 100%. RD to modify tube feeding orders to allow pt to eat at meals and if po intake is <50%, a bolus feed to be administered to aid in adequate nutrition. RD to continue to monitor for tolerance. Labs and medications reviewed.   Diet Order:   Diet Order            DIET - DYS 1 Room service appropriate? Yes; Fluid consistency: Honey Thick  Diet effective now              EDUCATION NEEDS:   Not appropriate for education at this time  Skin:  Skin Assessment: Skin Integrity Issues: Skin Integrity Issues:: Stage II Stage II: sacrum  Last BM:  1/10  Height:   Ht  Readings from Last 1 Encounters:  01/28/19 5' 8.5" (1.[name] m)    Weight:   Wt Readings from Last 1 Encounters:  02/22/19 68 kg    Ideal Body Weight:  70 kg  BMI:  Body mass index is 22.46 kg/m.  Estimated Nutritional Needs:   Kcal:  6387-5643  Protein:  100-120 grams  Fluid:  >2L    Corrin Parker, MS, RD, LDN Pager # [phone] After hours/ weekend pager # [phone]

## 2019-02-22 NOTE — Progress Notes (Signed)
PROGRESS NOTE  [name]   [id number]  DOB: January 29, 1951  DOA: 01/18/2019 PCP: Shon Baton, MD   Brief Narrative:  [name] 69 yo male smoker with A-fib on coumadin, HTN, tobacco abuse who was brought to the ED for a change in mental status and was found to be nonverbal in the ED and found to have an acute Lt MCA CVA. He received thrombolytics in the ED and he required intubation for airway protection.  Sputum culture grew out streptococcal pneumonia and Enterobacter cloacae and he was treated with antibiotics. He had difficulty weaning from vent and required tracheostomy and PEG tube.  12/10 tPA administered 12/11 intubated 12/21 Tracheostomy 12/22 PEG placed 01/04 patient pulled out trach >> left out  Subjective: Very dysarthric but today tells me "hands hurt."  Assessment & Plan:  Left embolic stroke-history of CVA -With left gaze, right hemiparesis, right neglect, aphasia and acute confusion/ agitation -Treated with TPA 06/18/2018 -CT head in ED-no acute findings -CT head 01/19/2019: Interval demarcation of acute cortically based infarct within the left frontal lobe - CTA head and neck- Prominent soft plaque at the left ICA bulb. 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  -MRI 12/12: Multifocal acute ischemia within left MCA territory including the left postcentral gyrus-no hemorrhage or mass-effect-old right occipital and cerebellar infarcts-chronic ischemic microangiopathy -2D echo 12/11: EF 55 to 60%-no thrombus or embolus noted -LDL 182- Lipitor increased from 40 to 80 mg daily -Hemoglobin A1c 5.1 -Initially treated with heparin-he was on warfarin prior to admission and INR was 1.1 on admission -he is now on Eliquis since 12/26  A. fib with RVR -Started on Amiodarone and Cardizem infusions, now on PO -Currently on metoprolol 100 mg twice daily, amiodarone 200 mg daily and Cardizem,short  acting 30 mg every 6 hours  Delirium-acute metabolic encephalopathy -Vitamin B12 level was 161-supplements have been started -Continues to be quite confused and mostly non-verbal -receiving Seroquel and Depakote - Depakote increased to 500 BID- Seroquel increased to 100 at bedtime and 50 mg in AM    Fever/ tachycardia/ leukocytosis 1/8 Enterobacter UTI 1/2 - recheck blood cultures, UA, Urine culture and CXR - CXR negative- likely due to UTI - see below - sepsis picture resolved- U culture still growing >=100,000 COLONIES/mL ENTEROBACTER CLOACAE - Cont ceftriaxone- reset schedule to day 1 being 1/8- stop date 1/14  Pulled PEG - replaced 1/9  Acute respiratory failure-hypoxic -Initially intubated for airway protection secondary to acute CVA -The patient also developed pneumonia with cultures growing out strep pneumonia and Enterobacter cloacae and was treated with cefepime -Underwent tracheostomy on 12/21 -He pulled his trach out on 1/4  Dysphagia severe protein calorie malnutrition -He continues to have a PEG tube -Continue Osmolite 474 mL 3 times daily -prostat 30 cc daily - initially was refusing to eat/drink but 1/13 RN says he is taking PO so will change some meds to PO  B12 and Iron deficiency anemia with macrocytosis -Feraheme given on 12/29 - Macrocytosis likely due to B12 deficiency and ETOH abuse  Thrombocytosis -started on Hydrea by Dr. Marin Olp on 12/28-JAK2 assay was negative -Platelet count improving after IV iron given therefore was secondary to IDA- Hydrea d/c'd 1/7    Tobacco abuse  Time spent in minutes: 22 DVT prophylaxis: Eliquis Code Status: Full code Family Communication:   Disposition Plan: SNF is the long term plan- currently still having some trouble with agitation but not as severe  as before. Will have completed antibiotics 1/14.  Consultants:   PCCM admitted patient  Neurology  Hematology  Palliative care  General surgery  Procedures:   Transthoracic Echocardiogram  01/19/2019 IMPRESSIONS 1. Left ventricular ejection fraction, by visual estimation, is 55 to 60%. The left ventricle has normal function. There is moderately increased left ventricular hypertrophy. 2. The left ventricle demonstrates regional wall motion abnormalities. 3. Global right ventricle has normal systolic function.The right ventricular size is normal. No increase in right ventricular wall thickness. 4. Left atrial size was normal. 5. Right atrial size was mildly dilated. 6. The mitral valve is normal in structure. Mild to moderate mitral valve regurgitation. 7. The tricuspid valve is normal in structure. Tricuspid valve regurgitation is trivial. 8. The aortic valve is normal in structure. Aortic valve regurgitation is not visualized. 9. The pulmonic valve was normal in structure. Pulmonic valve regurgitation is not visualized. 10. The atrial septum is grossly normal.  Antimicrobials:  Anti-infectives (From admission, onward)   Start     Dose/Rate Route Frequency Ordered Stop   02/16/19 1045  cefTRIAXone (ROCEPHIN) 1 g in sodium chloride 0.9 % 100 mL IVPB     1 g 200 mL/hr over 30 Minutes Intravenous Every 24 hours 02/16/19 1042 02/22/19 2359   02/12/19 1800  cefTRIAXone (ROCEPHIN) 1 g in sodium chloride 0.9 % 100 mL IVPB     1 g 200 mL/hr over 30 Minutes Intravenous  Once 02/12/19 1348 02/13/19 0412   02/10/19 1430  cefTRIAXone (ROCEPHIN) 1 g in sodium chloride 0.9 % 100 mL IVPB     1 g 200 mL/hr over 30 Minutes Intravenous Every 24 hours 02/10/19 1347 02/11/19 1754   01/26/19 1000  ceFEPIme (MAXIPIME) 2 g in sodium chloride 0.9 % 100 mL IVPB     2 g 200 mL/hr over 30 Minutes Intravenous Every 8 hours 01/26/19 0955 02/02/19 0145   01/25/19 1000  Ampicillin-Sulbactam (UNASYN) 3 g in sodium chloride 0.9 % 100 mL IVPB  Status:  Discontinued     3 g 200 mL/hr over 30 Minutes Intravenous Every 8 hours 01/25/19 0810 01/26/19 0955      Objective: Vitals:   02/21/19 2338 02/22/19 0259 02/22/19 0500 02/22/19 0756  BP: 91/66 99/73  104/88  Pulse: 73 75  67  Resp: 15 18  18   Temp: 98.1 F (36.7 C) 98.1 F (36.7 C)  97.8 F (36.6 C)  TempSrc: Oral Oral  Oral  SpO2: 98% 99%  99%  Weight:   68 kg   Height:        Intake/Output Summary (Last 24 hours) at 02/22/2019 1042 Last data filed at 02/22/2019 0300 Gross per 24 hour  Intake 480 ml  Output 1050 ml  Net -570 ml   Filed Weights   02/19/19 0500 02/21/19 0500 02/22/19 0500  Weight: 67 kg 66.5 kg 68 kg    Examination: General exam: Appears calm, says "Hands hurt" sitting up in bed getting bathed HEENT: PERRLA, oral mucosa moist, no sclera icterus or thrush- tracheotomy scar noted Respiratory system: Clear to auscultation. Respiratory effort normal. Cardiovascular system: S1 & S2 heard,  No murmurs  Gastrointestinal system: Abdomen soft, non-tender, nondistended. Normal bowel sounds - PEG present with abdominal binder in plsce  Neuro: right sided weakness, does not follow commands, mostly non-verbal Extremities: No cyanosis, clubbing or edema Skin: No rashes or ulcers  Data Reviewed: I have personally reviewed following labs and imaging studies  CBC: Recent Labs  Lab 02/16/19 0905 02/17/19  [id number] 02/18/19 0116 02/19/19 0611 02/20/19 0713  WBC 17.4* 20.0* 15.3* 9.4 7.9  NEUTROABS 15.0* 15.5* 12.0* 6.4 4.7  HGB 14.1 13.8 13.8 13.5 13.5  HCT 41.9 40.7 41.7 39.8 41.1  MCV 101.2* 100.7* 101.5* 100.5* 102.8*  PLT 342 304 267 244 [id number]   Basic Metabolic Panel: Recent Labs  Lab 02/16/19 0905 02/17/19 0649 02/18/19 0116  NA 135 131* 133*  K 4.4 4.2 3.9  CL 96* 93* 96*  CO2 26 26 25   GLUCOSE 88 98 134*  BUN 23 21 16   CREATININE 0.87 0.78 0.76  CALCIUM 9.9 9.5 9.3   GFR: Estimated Creatinine Clearance: 83.8 mL/min (by C-G formula based on SCr of 0.76 mg/dL). Liver Function Tests: No results for input(s): AST, ALT, ALKPHOS, BILITOT, PROT, ALBUMIN  in the last 168 hours. No results for input(s): LIPASE, AMYLASE in the last 168 hours. No results for input(s): AMMONIA in the last 168 hours. Coagulation Profile: No results for input(s): INR, PROTIME in the last 168 hours. Cardiac Enzymes: No results for input(s): CKTOTAL, CKMB, CKMBINDEX, TROPONINI in the last 168 hours. BNP (last 3 results) No results for input(s): PROBNP in the last 8760 hours. HbA1C: No results for input(s): HGBA1C in the last 72 hours. CBG: Recent Labs  Lab 02/19/19 2059  GLUCAP 72   Lipid Profile: No results for input(s): CHOL, HDL, LDLCALC, TRIG, CHOLHDL, LDLDIRECT in the last 72 hours. Thyroid Function Tests: No results for input(s): TSH, T4TOTAL, FREET4, T3FREE, THYROIDAB in the last 72 hours. Anemia Panel: No results for input(s): VITAMINB12, FOLATE, FERRITIN, TIBC, IRON, RETICCTPCT in the last 72 hours. Urine analysis:    Component Value Date/Time   COLORURINE YELLOW 02/16/2019 1345   APPEARANCEUR HAZY (A) 02/16/2019 1345   LABSPEC 1.012 02/16/2019 1345   PHURINE 7.0 02/16/2019 1345   GLUCOSEU NEGATIVE 02/16/2019 1345   GLUCOSEU NEGATIVE 12/13/2012 1514   HGBUR NEGATIVE 02/16/2019 1345   Relampago 02/16/2019 1345   KETONESUR NEGATIVE 02/16/2019 1345   PROTEINUR NEGATIVE 02/16/2019 1345   UROBILINOGEN 0.2 12/13/2012 1514   NITRITE POSITIVE (A) 02/16/2019 1345   LEUKOCYTESUR LARGE (A) 02/16/2019 1345   Sepsis Labs: @LABRCNTIP (procalcitonin:4,lacticidven:4) ) Recent Results (from the past 240 hour(s))  Culture, blood (routine x 2)     Status: None   Collection Time: 02/16/19 12:00 PM   Specimen: BLOOD  Result Value Ref Range Status   Specimen Description BLOOD RIGHT ANTECUBITAL  Final   Special Requests   Final    BOTTLES DRAWN AEROBIC ONLY Blood Culture adequate volume   Culture   Final    NO GROWTH 5 DAYS Performed at [facility], [place] [address], [place], [place] [address]    Report Status 02/21/2019 FINAL  Final   Culture, blood (routine x 2)     Status: None   Collection Time: 02/16/19 12:26 PM   Specimen: BLOOD RIGHT ARM  Result Value Ref Range Status   Specimen Description BLOOD RIGHT ARM  Final   Special Requests   Final    BOTTLES DRAWN AEROBIC ONLY Blood Culture adequate volume   Culture   Final    NO GROWTH 5 DAYS Performed at [facility], [place] [address] [place], [place] [address]    Report Status 02/21/2019 FINAL  Final  Culture, Urine     Status: Abnormal   Collection Time: 02/16/19  1:20 PM   Specimen: Urine, Catheterized  Result Value Ref Range Status   Specimen Description URINE, CATHETERIZED  Final  Special Requests   Final    NONE Performed at [facility], [place] [address], [place], [place] [address]    Culture >=100,000 COLONIES/mL ENTEROBACTER CLOACAE (A)  Final   Report Status 02/19/2019 FINAL  Final   Organism ID, Bacteria ENTEROBACTER CLOACAE (A)  Final      Susceptibility   Enterobacter cloacae - MIC*    CEFAZOLIN >=64 RESISTANT Resistant     CIPROFLOXACIN <=0.25 SENSITIVE Sensitive     GENTAMICIN <=1 SENSITIVE Sensitive     IMIPENEM <=0.25 SENSITIVE Sensitive     NITROFURANTOIN <=16 SENSITIVE Sensitive     TRIMETH/SULFA <=20 SENSITIVE Sensitive     PIP/TAZO >=128 RESISTANT Resistant     * >=100,000 COLONIES/mL ENTEROBACTER CLOACAE    Radiology Studies: DG Swallowing Func-Speech Pathology  Result Date: 02/21/2019 Objective Swallowing Evaluation: Type of Study: MBS-Modified Barium Swallow Study  Patient Details Name: NAMISH FENDT MRN: [id number] Date of Birth: December 01, 1950 Today's Date: 02/21/2019 Time: SLP Start Time (ACUTE ONLY): 0808 -SLP Stop Time (ACUTE ONLY): 0830 SLP Time Calculation (min) (ACUTE ONLY): 22 min Past Medical History: Past Medical History: Diagnosis Date . AAA (abdominal aortic aneurysm) ([place])   per last CT 03-26-2016---  3.9cm . Anticoagulated on Coumadin  . Arthritis of spine  . Benign localized prostatic hyperplasia with lower  urinary tract symptoms (LUTS)  . Chronic atrial fibrillation ([place]) cardiolgoist -- dr Stanford Breed  dx 2009 . Complication of anesthesia   per pt today (07-08-2016) had problem after having anesthesia 12/ 2016 surgery at [facility] "not good" went to ED ( in epic ED visit 01/ 2017 dx severe episode of major depression disorder and admitted to [facility] . COPD with emphysema ([place])  . Dyspnea on exertion  . Full dentures  . GERD (gastroesophageal reflux disease)  . Hematuria  . History of amaurosis fugax   right eye 02-08-2015 -- resolved /  documented in epic possible left eye amaurosis fugax 11/ 2008 (pt denies) / per last MRI show previous bilateral occipital lobe infarcts . History of concussion   as child . History of CVA (cerebrovascular accident) 10/ 2016  and per pt Dec 2017 in [place]  neurologist-  dr Leonie Man-- per note silent infarcts on MRI-- multiple acute infarcts bilateral cerebullm, right temporal lobe, and bilateral occipital lobes due to embolic phenomena (atrial fib.) . History of seizures as a child  . Hyperlipidemia  . Hypertension  . Left arm numbness   occasional numbess post residual axilla arterial occlusion and surgery . Major depressive disorder   hx severe episode without psychosis 02/2015 w/ homicidal ideation . Narcotic dependence ([place])  . PAD (peripheral artery disease) ([place])   followed by dr fields-- s/p  left axilla to branchial and left axilla to ulnar bypass graft due to ischemic hand . Pulmonary nodule   per CT 03-26-2016  right lower lobe . PVD (peripheral vascular disease) ([place])  . Right ureteral stone  . Senile purpura ([place])  . Weakness of left arm   residual from axilla arterial occlusion and post surgery . Wears glasses  Past Surgical History: Past Surgical History: Procedure Laterality Date . ARCH AORTOGRAM N/A 04/05/2014  Procedure: ARCH AORTOGRAM, FIRST ORDER CATHETERIZATION LEFT SUBCLAVIAN ARTERY;  Surgeon: Elam Dutch, MD;  Location: [facility] OR;  Service: Vascular;  Laterality: N/A; .  AXILLARY-FEMORAL BYPASS GRAFT Left 04/08/2014  Procedure: LEFT AXILLARY ARTERY TO BRACHIAL ARTERY BYPASS USING NON REVERSE LEFT GREATER SAPHENOUS VEIN ,LIGATION OF LEFT AXILLARY ARTERY ANEURYSM;  Surgeon: Elam Dutch,  MD;  Location: [place];  Service: Vascular;  Laterality: Left; . BYPASS AXILLA/BRACHIAL ARTERY Left 01/27/2015  Procedure: LEFT BRACHIAL-ULAR ARTERY BYPASS USING GREATER SAPHENOUS VEIN;  Surgeon: Elam Dutch, MD;  Location: [place];  Service: Vascular;  Laterality: Left; . CYSTOSCOPY WITH RETROGRADE PYELOGRAM, URETEROSCOPY AND STENT PLACEMENT Right 07/17/2016  Procedure: CYSTOSCOPY WITH RETROGRADE PYELOGRAM, URETEROSCOPY AND STENT PLACEMENT,LASER;  Surgeon: Festus Aloe, MD;  Location: [facility];  Service: Urology;  Laterality: Right; . CYSTOSCOPY/URETEROSCOPY/HOLMIUM LASER/STENT PLACEMENT Right 07/13/2016  Procedure: CYSTOSCOPY, RIGHT URETEROSCOPY RETROGRADE PYELOGRAM;  Surgeon: Kathie Rhodes, MD;  Location: [facility];  Service: Urology;  Laterality: Right; . EMBOLECTOMY Left 04/05/2014  Procedure: THROMBO ENDARTERECTOMY OF LEFT BRACHIAL, RADIAL AND ULNAR ARTERY,  Left Radial artery cut down and radial artery thrombectomy.;  Surgeon: Elam Dutch, MD;  Location: [facility] OR;  Service: Vascular;  Laterality: Left; . ESOPHAGOGASTRODUODENOSCOPY N/A 01/30/2019  Procedure: ESOPHAGOGASTRODUODENOSCOPY (EGD);  Surgeon: Jesusita Oka, MD;  Location: [facility] ENDOSCOPY;  Service: General;  Laterality: N/A; . facial cyst Right   cyst removal.  . IR REPLC GASTRO/COLONIC TUBE PERCUT W/FLUORO  02/17/2019 . PATCH ANGIOPLASTY Left 04/05/2014  Procedure: LEFT ARM VEIN PATCH ANGIOPLASTY;  Surgeon: Elam Dutch, MD;  Location: [facility];  Service: Vascular;  Laterality: Left; . PEG PLACEMENT N/A 01/30/2019  Procedure: PERCUTANEOUS ENDOSCOPIC GASTROSTOMY (PEG) PLACEMENT;  Surgeon: Jesusita Oka, MD;  Location: [place];  Service: General;  Laterality: N/A; . PERIPHERAL VASCULAR CATHETERIZATION N/A  01/22/2015  Procedure: Aortic Arch Angiography;  Surgeon: Elam Dutch, MD;  Location: [place] [facility];  Service: Cardiovascular;  Laterality: N/A; . THROMBECTOMY BRACHIAL ARTERY Left 11/20/2014  Procedure: 1.  Thrombectomy Left Axilo-Brachial Bypass  2.  Thromboendarterecotmy of Left Brachial Artery with Fogarty Thrombectomy of Radial and Ulnar Arteries with Dacron patch angioplasty Left Brachial Artery. 3. Intraoperative  Arteriogram times four.;  Surgeon: Mal Misty, MD;  Location: [place];  Service: Vascular;  Laterality: Left; . TRANSTHORACIC ECHOCARDIOGRAM  11/25/2014  moderate concentric LVH, ef 60-65%, unable to evaluation LVDF due to atrial fib/  mild MR/  severe LAE . VEIN HARVEST Left 04/08/2014  Procedure: LEFT GREATER SAPHENOUS VEIN HARVEST;  Surgeon: Elam Dutch, MD;  Location: [place];  Service: Vascular;  Laterality: Left; Marland Kitchen VEIN HARVEST Right 01/27/2015  Procedure: RIGHT GREATER SAPHENOUS VEIN HARVEST;  Surgeon: Elam Dutch, MD;  Location: [place];  Service: Vascular;  Laterality: Right; HPI: Pt is a 69 y/o M admitted with L MCA CVA s/p TPA. He was intubated 12/11-12/13, reintubated 12/13; trach 12/21 (decannulated 1/5). PMH: tobacco use, previous strokes, GERD, COPD, AAA, PVD, HTN, HLD, pulmonary nodule, depression, afib (recently missed doses of coumadin)  Subjective: pt alert, aphasic Assessment / Plan / Recommendation CHL IP CLINICAL IMPRESSIONS 02/21/2019 Clinical Impression Pt presents with a mild-moderate oropharyngeal dysphagia. He has delayed oral transit and mild oral residuals. He has impaired timing or swallow initiation as well as reduced hyolaryngeal movement, epiglottic inversion, and LVC, although his pharyngeal squeeze is good. This results in trace to no residue in the pharynx but he does have frequent penetration of thin and nectar thick liquids. He is not able to clear this penetration and only intermittent coughs to command. As the penetrates remain, they eventually  fall below the vocal folds and are silently aspirated. When challenged with larger boluses he has more immediate silent aspiration. Recommend Dys 1 diet and honey thick liquids for now with additional SLP f/u and potential pharyngeal exercises as  cognitive-linguistic status allows.  SLP Visit Diagnosis Dysphagia, oropharyngeal phase (R13.12) Attention and concentration deficit following -- Frontal lobe and executive function deficit following -- Impact on safety and function Moderate aspiration risk   CHL IP TREATMENT RECOMMENDATION 02/21/2019 Treatment Recommendations Therapy as outlined in treatment plan below   Prognosis 02/21/2019 Prognosis for Safe Diet Advancement Good Barriers to Reach Goals Language deficits Barriers/Prognosis Comment -- CHL IP DIET RECOMMENDATION 02/21/2019 SLP Diet Recommendations Dysphagia 1 (Puree) solids;Honey thick liquids Liquid Administration via Cup;Straw Medication Administration Via alternative means Compensations Minimize environmental distractions;Slow rate;Small sips/bites;Lingual sweep for clearance of pocketing Postural Changes Seated upright at 90 degrees   CHL IP OTHER RECOMMENDATIONS 02/21/2019 Recommended Consults -- Oral Care Recommendations Oral care BID Other Recommendations --   CHL IP FOLLOW UP RECOMMENDATIONS 02/21/2019 Follow up Recommendations Skilled Nursing facility   [facility] IP FREQUENCY AND DURATION 02/21/2019 Speech Therapy Frequency (ACUTE ONLY) min 2x/week Treatment Duration 2 weeks      CHL IP ORAL PHASE 02/21/2019 Oral Phase Impaired Oral - Pudding Teaspoon -- Oral - Pudding Cup -- Oral - Honey Teaspoon -- Oral - Honey Cup Delayed oral transit;Lingual/palatal residue Oral - Nectar Teaspoon -- Oral - Nectar Cup Delayed oral transit;Lingual/palatal residue Oral - Nectar Straw Delayed oral transit;Lingual/palatal residue Oral - Thin Teaspoon Delayed oral transit;Lingual/palatal residue Oral - Thin Cup Delayed oral transit;Lingual/palatal residue Oral - Thin Straw  Delayed oral transit;Lingual/palatal residue Oral - Puree Delayed oral transit;Lingual/palatal residue Oral - Mech Soft -- Oral - Regular -- Oral - Multi-Consistency -- Oral - Pill -- Oral Phase - Comment Delayed oral transit;Lingual/palatal residue  CHL IP PHARYNGEAL PHASE 02/21/2019 Pharyngeal Phase Impaired Pharyngeal- Pudding Teaspoon -- Pharyngeal -- Pharyngeal- Pudding Cup -- Pharyngeal -- Pharyngeal- Honey Teaspoon -- Pharyngeal -- Pharyngeal- Honey Cup Reduced epiglottic inversion;Reduced anterior laryngeal mobility;Reduced laryngeal elevation;Reduced airway/laryngeal closure Pharyngeal -- Pharyngeal- Nectar Teaspoon -- Pharyngeal -- Pharyngeal- Nectar Cup Reduced epiglottic inversion;Reduced anterior laryngeal mobility;Reduced laryngeal elevation;Reduced airway/laryngeal closure;Penetration/Aspiration during swallow Pharyngeal Material enters airway, passes BELOW cords without attempt by patient to eject out (silent aspiration) Pharyngeal- Nectar Straw Reduced epiglottic inversion;Reduced anterior laryngeal mobility;Reduced laryngeal elevation;Reduced airway/laryngeal closure;Penetration/Aspiration during swallow Pharyngeal Material enters airway, passes BELOW cords without attempt by patient to eject out (silent aspiration) Pharyngeal- Thin Teaspoon Reduced epiglottic inversion;Reduced anterior laryngeal mobility;Reduced laryngeal elevation;Reduced airway/laryngeal closure;Penetration/Aspiration during swallow Pharyngeal Material enters airway, remains ABOVE vocal cords and not ejected out Pharyngeal- Thin Cup Reduced epiglottic inversion;Reduced anterior laryngeal mobility;Reduced laryngeal elevation;Reduced airway/laryngeal closure;Penetration/Aspiration during swallow Pharyngeal Material enters airway, passes BELOW cords without attempt by patient to eject out (silent aspiration) Pharyngeal- Thin Straw Reduced epiglottic inversion;Reduced anterior laryngeal mobility;Reduced laryngeal elevation;Reduced  airway/laryngeal closure;Penetration/Aspiration during swallow Pharyngeal Material enters airway, passes BELOW cords without attempt by patient to eject out (silent aspiration) Pharyngeal- Puree -- Pharyngeal -- Pharyngeal- Mechanical Soft -- Pharyngeal -- Pharyngeal- Regular -- Pharyngeal -- Pharyngeal- Multi-consistency -- Pharyngeal -- Pharyngeal- Pill -- Pharyngeal -- Pharyngeal Comment --  CHL IP CERVICAL ESOPHAGEAL PHASE 02/21/2019 Cervical Esophageal Phase WFL Pudding Teaspoon -- Pudding Cup -- Honey Teaspoon -- Honey Cup -- Nectar Teaspoon -- Nectar Cup -- Nectar Straw -- Thin Teaspoon -- Thin Cup -- Thin Straw -- Puree -- Mechanical Soft -- Regular -- Multi-consistency -- Pill -- Cervical Esophageal Comment -- Osie Bond., M.A. CCC-SLP Acute Rehabilitation Services Pager [phone] Office [phone] 02/21/2019, 9:17 AM              Scheduled Meds: . amiodarone  200 mg Per Tube Daily  . apixaban  5 mg Per Tube BID  . atorvastatin  80 mg Oral q1800  . chlorhexidine  15 mL Mouth Rinse BID  . diltiazem  30 mg Oral Q6H  . divalproex  500 mg Oral Q12H  . feeding supplement (OSMOLITE 1.5 CAL)  474 mL Per Tube TID  . feeding supplement (PRO-STAT SUGAR FREE 64)  30 mL Per Tube BID  . folic acid  2 mg Oral Daily  . mouth rinse  15 mL Mouth Rinse q12n4p  . metoprolol tartrate  100 mg Per Tube BID  . QUEtiapine  150 mg Oral QHS  . QUEtiapine  50 mg Per Tube Daily  . sennosides  5 mL Per Tube BID  . vitamin B-12  1,000 mcg Per Tube Daily   Continuous Infusions: . sodium chloride 10 mL/hr at 02/01/19 0541  . cefTRIAXone (ROCEPHIN)  IV 1 g (02/21/19 1223)    LOS: 35 days   Makalyn Lennox Marry Guan, MD [facility] Pager: [email or web address] Password [facility] 02/22/2019, 10:42 AM

## 2019-02-22 NOTE — Plan of Care (Signed)

## 2019-02-23 NOTE — Progress Notes (Addendum)
PROGRESS NOTE    [name]  [id number] DOB: Mar 29, 1950 DOA: 01/18/2019 PCP: Shon Baton, MD    Brief Narrative:  [name] 69 yo male smoker with A-fib on coumadin, HTN, tobacco abuse who was brought to the ED for a change in mental status and was found to be nonverbal in the ED and found to have an acute Lt MCA CVA. He received thrombolytics in the ED and he required intubation for airway protection.  Sputum culture grew out streptococcal pneumonia and Enterobacter cloacae and he was treated with antibiotics.He had difficulty weaning from vent and required tracheostomy and PEG tube.  12/10 tPA administered 12/11 intubated 12/21 Tracheostomy 12/22 PEG placed 01/04 patient pulled out trach >> left out    Assessment & Plan:   Principal Problem:   Embolic stroke [facility]) Active Problems:   TOBACCO ABUSE   HYPERTENSION, BENIGN ESSENTIAL   Atrial fibrillation with RVR (HCC)   PAD (peripheral artery disease) (HCC)   Abdominal aortic aneurysm (AAA) (HCC)   Hyperlipemia   Aspiration into airway   Endotracheal tube present   Acute on chronic respiratory failure (HCC)   Protein-calorie malnutrition, severe   Pressure injury of skin   B12 deficiency   Hx of embolic stroke   Dysphagia due to recent cerebrovascular accident   VAP (ventilator-associated pneumonia) (HCC)   Thrombocytosis (HCC)   Fe deficiency anemia   Fever   Tachycardia   Tachypnea   Left embolic stroke-history of CVA -With left gaze, right hemiparesis, right neglect, aphasia and acute confusion/ agitation -Treated with TPA 06/18/2018 -CT head in ED-no acute findings -CT head 01/19/2019: Interval demarcation of acute cortically based infarct within the left frontal lobe - CTA head and neck- Prominent soft plaque at the left ICA bulb. 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  -MRI 12/12: Multifocal acute ischemia within left MCA  territory including the left postcentral gyrus-no hemorrhage or mass-effect-old right occipital and cerebellar infarcts-chronic ischemic microangiopathy -2D echo 12/11: EF 55 to 60%-no thrombus or embolus noted -LDL 182- Lipitor increased from 40 to 80 mg daily -Hemoglobin A1c 5.1 -Initially treated with heparin-he was on warfarin prior to admission and INR was 1.1 on admission -he is now on Eliquis since 12/26  A. fib with RVR -Started on Amiodarone and Cardizem infusions, now on PO -Currently on metoprolol 100 mg twice daily, amiodarone 200 mg daily and Cardizem,short acting 30 mg every 6 hours  Delirium-acute metabolic encephalopathy -Vitamin B12 level was 161-supplements have been started -Continues to be quite confused and mostly non-verbal -receiving Seroquel and Depakote - Depakote increased to 500 BID- Seroquel increased to 100 at bedtime and 50 mg in AM    Fever/ tachycardia/ leukocytosis 1/8 Enterobacter UTI 1/2 - recheck blood cultures, UA, Urine culture and CXR - CXR negative- likely due to UTI - see below - sepsis picture resolved- U culture still growing >=100,000 COLONIES/mL ENTEROBACTER CLOACAE - Cont ceftriaxone- reset schedule to day 1 being 1/8- stop date 1/14  Pulled PEG - replaced 1/9  Acute respiratory failure-hypoxic -Initially intubated for airway protection secondary to acute CVA -The patient also developed pneumonia with cultures growing out strep pneumonia and Enterobacter cloacae and was treated with cefepime -Underwent tracheostomy on 12/21 -He pulled his trach out on 1/4  Dysphagia severe protein calorie malnutrition -He continues to have a PEG tube -Continue Osmolite 474 mL 3 times daily -prostat 30 cc daily - initially was refusing to  eat/drink but 1/13 RN says he is taking PO so will change some meds to PO  B12 and Iron deficiency anemia with macrocytosis -Feraheme given on 12/29 - Macrocytosis likely due to B12 deficiency and ETOH  abuse  Thrombocytosis -started on Hydrea by Dr. Marin Olp on 12/28-JAK2 assay was negative -Platelet count improving after IV iron given therefore was secondary to IDA- Hydrea d/c'd 1/7    Tobacco abuse   DVT prophylaxis: [id number] Code Status: Full code  Family Communication:  Disposition Plan: SNF - awaiting bed placement   Consultants:   PCCM  Neurology  Hematology  Palliative care  General surgery  Procedures:  ECHO  Intubated  Tracheostomy   PEG    Antimicrobials: Anti-infectives (From admission, onward)   Start     Dose/Rate Route Frequency Ordered Stop   02/16/19 1045  cefTRIAXone (ROCEPHIN) 1 g in sodium chloride 0.9 % 100 mL IVPB     1 g 200 mL/hr over 30 Minutes Intravenous Every 24 hours 02/16/19 1042 02/22/19 1232   02/12/19 1800  cefTRIAXone (ROCEPHIN) 1 g in sodium chloride 0.9 % 100 mL IVPB     1 g 200 mL/hr over 30 Minutes Intravenous  Once 02/12/19 1348 02/13/19 0412   02/10/19 1430  cefTRIAXone (ROCEPHIN) 1 g in sodium chloride 0.9 % 100 mL IVPB     1 g 200 mL/hr over 30 Minutes Intravenous Every 24 hours 02/10/19 1347 02/11/19 1754   01/26/19 1000  ceFEPIme (MAXIPIME) 2 g in sodium chloride 0.9 % 100 mL IVPB     2 g 200 mL/hr over 30 Minutes Intravenous Every 8 hours 01/26/19 0955 02/02/19 0145   01/25/19 1000  Ampicillin-Sulbactam (UNASYN) 3 g in sodium chloride 0.9 % 100 mL IVPB  Status:  Discontinued     3 g 200 mL/hr over 30 Minutes Intravenous Every 8 hours 01/25/19 0810 01/26/19 0955       Subjective: Sitting up to char. Dysarthric, states hands hurt. Unclear etiology. Per nursing more understandable today.  Objective: Vitals:   02/23/19 0319 02/23/19 0500 02/23/19 0748 02/23/19 1144  BP: (!) 105/93  (!) 149/89 (!) 115/95  Pulse: 82  91 74  Resp: 18  19 18   Temp: 97.6 F (36.4 C)  98.8 F (37.1 C) 98.1 F (36.7 C)  TempSrc: Axillary  Oral Oral  SpO2: 100%  100% 99%  Weight:  66 kg    Height:        Intake/Output  Summary (Last 24 hours) at 02/23/2019 1616 Last data filed at 02/23/2019 0845 Gross per 24 hour  Intake 600 ml  Output 900 ml  Net -300 ml   Filed Weights   02/21/19 0500 02/22/19 0500 02/23/19 0500  Weight: 66.5 kg 68 kg 66 kg    Examination:  General exam: Appears calm and comfortable  Respiratory system: Clear to auscultation. Respiratory effort normal. Cardiovascular system: S1 & S2 heard, RRR.  Gastrointestinal system: Abdomen is nondistended, soft and nontender. Binder in place Central nervous system: Alert, responds to name, knew he was in the hospital today, right sided weakness, dysarthric Extremities: no real edema Skin: No rashes   Data Reviewed: I have personally reviewed following labs and imaging studies  CBC: Recent Labs  Lab 02/17/19 0649 02/18/19 0116 02/19/19 0611 02/20/19 0713  WBC 20.0* 15.3* 9.4 7.9  NEUTROABS 15.5* 12.0* 6.4 4.7  HGB 13.8 13.8 13.5 13.5  HCT 40.7 41.7 39.8 41.1  MCV 100.7* 101.5* 100.5* 102.8*  PLT 304 267 244 260  Basic Metabolic Panel: Recent Labs  Lab 02/17/19 0649 02/18/19 0116  NA 131* 133*  K 4.2 3.9  CL 93* 96*  CO2 26 25  GLUCOSE 98 134*  BUN 21 16  CREATININE 0.78 0.76  CALCIUM 9.5 9.3   CBG: Recent Labs  Lab 02/19/19 2059  GLUCAP 72    Recent Results (from the past 240 hour(s))  Culture, blood (routine x 2)     Status: None   Collection Time: 02/16/19 12:00 PM   Specimen: BLOOD  Result Value Ref Range Status   Specimen Description BLOOD RIGHT ANTECUBITAL  Final   Special Requests   Final    BOTTLES DRAWN AEROBIC ONLY Blood Culture adequate volume   Culture   Final    NO GROWTH 5 DAYS Performed at [facility], [place] [address] [place], [place] [address]    Report Status 02/21/2019 FINAL  Final  Culture, blood (routine x 2)     Status: None   Collection Time: 02/16/19 12:26 PM   Specimen: BLOOD RIGHT ARM  Result Value Ref Range Status   Specimen Description BLOOD RIGHT ARM  Final    Special Requests   Final    BOTTLES DRAWN AEROBIC ONLY Blood Culture adequate volume   Culture   Final    NO GROWTH 5 DAYS Performed at [facility], [place] [address] [place], [place] [address]    Report Status 02/21/2019 FINAL  Final  Culture, Urine     Status: Abnormal   Collection Time: 02/16/19  1:20 PM   Specimen: Urine, Catheterized  Result Value Ref Range Status   Specimen Description URINE, CATHETERIZED  Final   Special Requests   Final    NONE Performed at [facility], [place] [address], [place], [place] [address]    Culture >=100,000 COLONIES/mL ENTEROBACTER CLOACAE (A)  Final   Report Status 02/19/2019 FINAL  Final   Organism ID, Bacteria ENTEROBACTER CLOACAE (A)  Final      Susceptibility   Enterobacter cloacae - MIC*    CEFAZOLIN >=64 RESISTANT Resistant     CIPROFLOXACIN <=0.25 SENSITIVE Sensitive     GENTAMICIN <=1 SENSITIVE Sensitive     IMIPENEM <=0.25 SENSITIVE Sensitive     NITROFURANTOIN <=16 SENSITIVE Sensitive     TRIMETH/SULFA <=20 SENSITIVE Sensitive     PIP/TAZO >=128 RESISTANT Resistant     * >=100,000 COLONIES/mL ENTEROBACTER CLOACAE      Radiology Studies: No results found.   Scheduled Meds: . amiodarone  200 mg Per Tube Daily  . apixaban  5 mg Per Tube BID  . atorvastatin  80 mg Oral q1800  . chlorhexidine  15 mL Mouth Rinse BID  . diltiazem  30 mg Oral Q6H  . divalproex  500 mg Oral Q12H  . feeding supplement (OSMOLITE 1.5 CAL)  474 mL Per Tube TID PC  . feeding supplement (PRO-STAT SUGAR FREE 64)  30 mL Per Tube BID  . folic acid  2 mg Oral Daily  . mouth rinse  15 mL Mouth Rinse q12n4p  . metoprolol tartrate  100 mg Per Tube BID  . QUEtiapine  150 mg Oral QHS  . QUEtiapine  50 mg Per Tube Daily  . sennosides  5 mL Per Tube BID  . vitamin B-12  1,000 mcg Per Tube Daily   Continuous Infusions: . sodium chloride 10 mL/hr at 02/01/19 0541     LOS: 36 days    Donnamae Jude, MD 02/23/2019 4:16  PM [phone] [facility] If 7PM-7AM, please contact night-coverage 02/23/2019, 4:16 PM

## 2019-02-23 NOTE — Progress Notes (Signed)
Physical Therapy Treatment Patient Details Name: [name] MRN: [id number] DOB: 1950-02-28 Today's Date: 02/23/2019    History of Present Illness 55yM admitted 01/20/19 with hypertensive emergency in setting of acute L MCA stroke s/p tPA, AF/RVR.  Has been noncompliant with his warfarin for A. Fib. Intubated for respiratory failure. Trach 01/29/19 with PEG planned for 01/30/19. No sedation at time of PT eval.     PT Comments    Patient progressing well towards PT goals. Requires Min A of 2 to stand from EOB and Min A for gait training with cues for RW management and assist with RUE. Pt with poor safety awareness/awareness of deficits with RUE falling off walker handle without notice. Appears frustrated with speech; answers questions mostly with yes/no. Able to donn socks with Mod A as pt with difficulty using RUE for functional tasks. Continue to recommend SNF. Will follow.   Follow Up Recommendations  SNF;Supervision/Assistance - 24 hour     Equipment Recommendations  Other (comment)(defer to next venue)    Recommendations for Other Services       Precautions / Restrictions Precautions Precautions: Fall Restrictions Weight Bearing Restrictions: No    Mobility  Bed Mobility Overal bed mobility: Needs Assistance Bed Mobility: Supine to Sit     Supine to sit: Min assist;HOB elevated     General bed mobility comments: Min A for R side. increased time.  Transfers Overall transfer level: Needs assistance Equipment used: Rolling walker (2 wheeled) Transfers: Sit to/from Stand Sit to Stand: +2 physical assistance;Min assist         General transfer comment: Min assist for power up, steadying, proper hand placement on RW especially RUE as it keeps falling off without notice. Transferred to chair post ambulation.  Ambulation/Gait Ambulation/Gait assistance: Min assist;+2 safety/equipment Gait Distance (Feet): 100 Feet Assistive device: Rolling walker (2  wheeled) Gait Pattern/deviations: Step-through pattern;Decreased stride length;Trunk flexed;Narrow base of support;Shuffle Gait velocity: decr   General Gait Details: Slow, mildly unsteady gait with RUE falling of walker handle, max cues for safety as pt taking LUE off RW at times to itch self. Cues for safety, RW management. Chair follow helpful. HR in low 100s.   Stairs             Wheelchair Mobility    Modified Rankin (Stroke Patients Only) Modified Rankin (Stroke Patients Only) Pre-Morbid Rankin Score: No symptoms Modified Rankin: Moderately severe disability     Balance Overall balance assessment: Needs assistance Sitting-balance support: Feet supported;No upper extremity supported Sitting balance-Leahy Scale: Fair Sitting balance - Comments: Min guard for safety   Standing balance support: During functional activity Standing balance-Leahy Scale: Poor Standing balance comment: reliant on external support.                            Cognition Arousal/Alertness: Awake/alert Behavior During Therapy: Flat affect Overall Cognitive Status: Difficult to assess Area of Impairment: Orientation;Attention;Following commands;Problem solving                 Orientation Level: Disoriented to;Situation;Time Current Attention Level: Sustained   Following Commands: Follows one step commands with increased time     Problem Solving: Slow processing;Decreased initiation;Difficulty sequencing;Requires tactile cues;Requires verbal cues General Comments: Oriented to place and name with 2 choices but no date. Mostly responding yes/no, pt perseverating on adjusting socks once donned. Pt attempting to speak, but unintelligible at times, appears frustrated stating "sh*t"  "damn"  Exercises      General Comments General comments (skin integrity, edema, etc.): VSS.      Pertinent Vitals/Pain Pain Assessment: Faces Faces Pain Scale: Hurts little more Pain  Location: head Pain Descriptors / Indicators: Headache Pain Intervention(s): Monitored during session    Home Living                      Prior Function            PT Goals (current goals can now be found in the care plan section) Progress towards PT goals: Progressing toward goals    Frequency    Min 3X/week      PT Plan Current plan remains appropriate    Co-evaluation              AM-PAC PT "6 Clicks" Mobility   Outcome Measure  Help needed turning from your back to your side while in a flat bed without using bedrails?: A Little Help needed moving from lying on your back to sitting on the side of a flat bed without using bedrails?: A Little Help needed moving to and from a bed to a chair (including a wheelchair)?: A Lot Help needed standing up from a chair using your arms (e.g., wheelchair or bedside chair)?: A Lot Help needed to walk in hospital room?: A Little Help needed climbing 3-5 steps with a railing? : Total 6 Click Score: 14    End of Session Equipment Utilized During Treatment: Gait belt Activity Tolerance: Patient tolerated treatment well Patient left: in chair;with call bell/phone within reach;with chair alarm set Nurse Communication: Mobility status PT Visit Diagnosis: Hemiplegia and hemiparesis;Other abnormalities of gait and mobility (R26.89);Other symptoms and signs involving the nervous system (R29.898) Hemiplegia - Right/Left: Right Hemiplegia - dominant/non-dominant: Dominant Hemiplegia - caused by: Cerebral infarction     Time: [id number] PT Time Calculation (min) (ACUTE ONLY): 24 min  Charges:  $Gait Training: 8-22 mins $Therapeutic Activity: 8-22 mins                     Marisa Severin, PT, DPT Acute Rehabilitation Services Pager [phone] Office [phone]       [place] 02/23/2019, 1:16 PM

## 2019-02-24 DIAGNOSIS — I63119 Cerebral infarction due to embolism of unspecified vertebral artery: Secondary | ICD-10-CM

## 2019-02-24 NOTE — Progress Notes (Signed)
PROGRESS NOTE  [name] [id number] DOB: 1950-05-29 DOA: 01/18/2019 PCP: Shon Baton, [name]  HPI/Recap of past 24 hours: Per HPI: [name] yo male smoker with A-fib on coumadin, HTN, tobacco abuse who was brought to the ED for a change in mental status and was found to be nonverbal in the ED and found to have an acute Lt MCA CVA. He received thrombolytics in the ED and he required intubation for airway protection. Sputum culture grew out streptococcal pneumonia and Enterobacter cloacae and he was treated with antibiotics.He had difficulty weaning from vent and required tracheostomy and PEG tube.  12/10 tPA administered 12/11 intubated 12/21 Tracheostomy 12/22 PEG placed 01/04 patient pulled out trach >> left out  Patient seen and examined at bedside he denies any complaints.  The ENT is helping with trimming his beard  Assessment/Plan: Principal Problem:   Embolic stroke (HCC) Active Problems:   TOBACCO ABUSE   HYPERTENSION, BENIGN ESSENTIAL   Atrial fibrillation with RVR (HCC)   PAD (peripheral artery disease) (HCC)   Abdominal aortic aneurysm (AAA) (HCC)   Hyperlipemia   Aspiration into airway   Endotracheal tube present   Acute on chronic respiratory failure (HCC)   Protein-calorie malnutrition, severe   Pressure injury of skin   B12 deficiency   Hx of embolic stroke   Dysphagia due to recent cerebrovascular accident   VAP (ventilator-associated pneumonia) (HCC)   Thrombocytosis (HCC)   Fe deficiency anemia   Fever   Tachycardia   Tachypnea   1.  Left embolic stroke with history of CVA.  Patient was treated with TPA stent is now on Eliquis which was started December 26  2.  A. fib with rapid ventricular rate now controlled with amiodarone and Cardizem p.o. as well as metoprolol  Delirium due to acute metabolic encephalopathy that has resolved  4.  Enterobacter UTI patient has completed antibiotics on February 21, 2018  Code Status: Full   Severity of Illness: The appropriate patient status for this patient is INPATIENT. Inpatient status is judged to be reasonable and necessary in order to provide the required intensity of service to ensure the patient's safety. The patient's presenting symptoms, physical exam findings, and initial radiographic and laboratory data in the context of their chronic comorbidities is felt to place them at high risk for further clinical deterioration. Furthermore, it is not anticipated that the patient will be medically stable for discharge from the hospital within 2 midnights of admission. The following factors support the patient status of inpatient.  Patient has had complicated course and is waiting nursing placement * I certify that at the point of admission it is my clinical judgment that the patient will require inpatient hospital care spanning beyond 2 midnights from the point of admission due to high intensity of service, high risk for further deterioration and high frequency of surveillance required.*    Family Communication: None at bedside  Disposition Plan: SNF   Consultants:  General surgery   PCCM   neurology   hematology   palliative care  Procedures:  Intubation  Tracheostomy  PEG placement  Antimicrobials:  Completed antibiotics  DVT prophylaxis: Eliquis   Objective: Vitals:   02/24/19 0500 02/24/19 0802 02/24/19 1204 02/24/19 1701  BP:  124/86 107/81 124/86  Pulse:  69 64 65  Resp:  20 19 18   Temp:  (!) 97.5 F (36.4 C) 98 F (36.7 C) 97.7 F (36.5 C)  TempSrc:  Axillary Axillary Axillary  SpO2:  98%  97% 94%  Weight: 66 kg     Height:        Intake/Output Summary (Last 24 hours) at 02/24/2019 1944 Last data filed at 02/24/2019 1843 Gross per 24 hour  Intake 960 ml  Output 1100 ml  Net -140 ml   Filed Weights   02/22/19 0500 02/23/19 0500 02/24/19 0500  Weight: 68 kg 66 kg 66 kg   Body mass index is 21.8 kg/m.  Exam:  . General: 69 y.o.  year-old male well developed well nourished in no acute distress.  Alert and oriented x3.  Unshaven male well developed . Cardiovascular: Regular rate and rhythm with no rubs or gallops.  No thyromegaly or JVD noted.   Marland Kitchen Respiratory: Clear to auscultation with no wheezes or rales. Good inspiratory effort. . Abdomen: Soft nontender nondistended with normal bowel sounds x4 quadrants. . Musculoskeletal: No lower extremity edema. 2/4 pulses in all 4 extremities. . Skin: No ulcerative lesions noted or rashes, . Psychiatry: Mood is appropriate for condition and setting . Neurology patient was able to verbalize a few words    Data Reviewed: CBC: Recent Labs  Lab 02/18/19 0116 02/19/19 0611 02/20/19 0713  WBC 15.3* 9.4 7.9  NEUTROABS 12.0* 6.4 4.7  HGB 13.8 13.5 13.5  HCT 41.7 39.8 41.1  MCV 101.5* 100.5* 102.8*  PLT 267 244 [id number]   Basic Metabolic Panel: Recent Labs  Lab 02/18/19 0116  NA 133*  K 3.9  CL 96*  CO2 25  GLUCOSE 134*  BUN 16  CREATININE 0.76  CALCIUM 9.3   GFR: Estimated Creatinine Clearance: 81.4 mL/min (by C-G formula based on SCr of 0.76 mg/dL). Liver Function Tests: No results for input(s): AST, ALT, ALKPHOS, BILITOT, PROT, ALBUMIN in the last 168 hours. No results for input(s): LIPASE, AMYLASE in the last 168 hours. No results for input(s): AMMONIA in the last 168 hours. Coagulation Profile: No results for input(s): INR, PROTIME in the last 168 hours. Cardiac Enzymes: No results for input(s): CKTOTAL, CKMB, CKMBINDEX, TROPONINI in the last 168 hours. BNP (last 3 results) No results for input(s): PROBNP in the last 8760 hours. HbA1C: No results for input(s): HGBA1C in the last 72 hours. CBG: Recent Labs  Lab 02/19/19 2059  GLUCAP 72   Lipid Profile: No results for input(s): CHOL, HDL, LDLCALC, TRIG, CHOLHDL, LDLDIRECT in the last 72 hours. Thyroid Function Tests: No results for input(s): TSH, T4TOTAL, FREET4, T3FREE, THYROIDAB in the last 72 hours.  Anemia Panel: No results for input(s): VITAMINB12, FOLATE, FERRITIN, TIBC, IRON, RETICCTPCT in the last 72 hours. Urine analysis:    Component Value Date/Time   COLORURINE YELLOW 02/16/2019 1345   APPEARANCEUR HAZY (A) 02/16/2019 1345   LABSPEC 1.012 02/16/2019 1345   PHURINE 7.0 02/16/2019 1345   GLUCOSEU NEGATIVE 02/16/2019 1345   GLUCOSEU NEGATIVE 12/13/2012 1514   HGBUR NEGATIVE 02/16/2019 1345   [place] 02/16/2019 1345   KETONESUR NEGATIVE 02/16/2019 1345   PROTEINUR NEGATIVE 02/16/2019 1345   UROBILINOGEN 0.2 12/13/2012 1514   NITRITE POSITIVE (A) 02/16/2019 1345   LEUKOCYTESUR LARGE (A) 02/16/2019 1345   Sepsis Labs: @LABRCNTIP (procalcitonin:4,lacticidven:4)  ) Recent Results (from the past 240 hour(s))  Culture, blood (routine x 2)     Status: None   Collection Time: 02/16/19 12:00 PM   Specimen: BLOOD  Result Value Ref Range Status   Specimen Description BLOOD RIGHT ANTECUBITAL  Final   Special Requests   Final    BOTTLES DRAWN AEROBIC ONLY Blood Culture adequate volume  Culture   Final    NO GROWTH 5 DAYS Performed at [facility], [place] [address], [place], [place] [address]    Report Status 02/21/2019 FINAL  Final  Culture, blood (routine x 2)     Status: None   Collection Time: 02/16/19 12:26 PM   Specimen: BLOOD RIGHT ARM  Result Value Ref Range Status   Specimen Description BLOOD RIGHT ARM  Final   Special Requests   Final    BOTTLES DRAWN AEROBIC ONLY Blood Culture adequate volume   Culture   Final    NO GROWTH 5 DAYS Performed at [facility], [place] [address], [place], [place] [address]    Report Status 02/21/2019 FINAL  Final  Culture, Urine     Status: Abnormal   Collection Time: 02/16/19  1:20 PM   Specimen: Urine, Catheterized  Result Value Ref Range Status   Specimen Description URINE, CATHETERIZED  Final   Special Requests   Final    NONE Performed at [facility], [place] [address] [place], [place] [address]     Culture >=100,000 COLONIES/mL ENTEROBACTER CLOACAE (A)  Final   Report Status 02/19/2019 FINAL  Final   Organism ID, Bacteria ENTEROBACTER CLOACAE (A)  Final      Susceptibility   Enterobacter cloacae - MIC*    CEFAZOLIN >=64 RESISTANT Resistant     CIPROFLOXACIN <=0.25 SENSITIVE Sensitive     GENTAMICIN <=1 SENSITIVE Sensitive     IMIPENEM <=0.25 SENSITIVE Sensitive     NITROFURANTOIN <=16 SENSITIVE Sensitive     TRIMETH/SULFA <=20 SENSITIVE Sensitive     PIP/TAZO >=128 RESISTANT Resistant     * >=100,000 COLONIES/mL ENTEROBACTER CLOACAE      Studies: No results found.  Scheduled Meds: . amiodarone  200 mg Per Tube Daily  . apixaban  5 mg Per Tube BID  . atorvastatin  80 mg Oral q1800  . chlorhexidine  15 mL Mouth Rinse BID  . diltiazem  30 mg Oral Q6H  . divalproex  500 mg Oral Q12H  . feeding supplement (OSMOLITE 1.5 CAL)  474 mL Per Tube TID PC  . feeding supplement (PRO-STAT SUGAR FREE 64)  30 mL Per Tube BID  . folic acid  2 mg Oral Daily  . mouth rinse  15 mL Mouth Rinse q12n4p  . metoprolol tartrate  100 mg Per Tube BID  . QUEtiapine  150 mg Oral QHS  . QUEtiapine  50 mg Per Tube Daily  . sennosides  5 mL Per Tube BID  . vitamin B-12  1,000 mcg Per Tube Daily    Continuous Infusions: . sodium chloride 10 mL/hr at 02/01/19 0541     LOS: 37 days     [name] Deer, [name] [facility]  To reach me or the doctor on call, go to: [email or web address] Password [facility]  02/24/2019, 7:44 PM

## 2019-02-25 MED ORDER — KETOROLAC TROMETHAMINE 15 MG/ML IJ SOLN
15.0000 mg | Freq: Once | INTRAMUSCULAR | Status: AC
  Administered 2019-02-25: 15 mg via INTRAVENOUS
  Filled 2019-02-25: qty 1

## 2019-02-25 MED ORDER — ORAL CARE MOUTH RINSE
15.0000 mL | Freq: Two times a day (BID) | OROMUCOSAL | Status: DC
  Administered 2019-02-25 – 2019-03-01 (×9): 15 mL via OROMUCOSAL

## 2019-02-25 NOTE — Progress Notes (Signed)
PROGRESS NOTE  [name] [id number] DOB: 1950/05/18 DOA: 01/18/2019 PCP: Shon Baton, MD  HPI/Recap of past 24 hours: Per HPI: [name] yo male smoker with A-fib on coumadin, HTN, tobacco abuse who was brought to the ED for a change in mental status and was found to be nonverbal in the ED and found to have an acute Lt MCA CVA. He received thrombolytics in the ED and he required intubation for airway protection. Sputum culture grew out streptococcal pneumonia and Enterobacter cloacae and he was treated with antibiotics.He had difficulty weaning from vent and required tracheostomy and PEG tube.  12/10 tPA administered 12/11 intubated 12/21 Tracheostomy 12/22 PEG placed 01/04 patient pulled out trach >> left out  Patient seen and examined at bedside he denies any complaints.  The NT is helping with trimming his beard  General 17 2021 Subjective Patient seen and examined at bedside nurse complained that he was complaining of pain in the right hand which she noted was cold and reduced capillary refill.  Upon questioning the patient he did show me the hand but on my examination it was no longer cold it was warm did not appreciate much redness..  Later on he continued to complain of increased pain and that Tylenol was not helping so I ordered tramadol for him and I have also ordered Doppler ultrasound  Assessment/Plan: Principal Problem:   Embolic stroke (HCC) Active Problems:   TOBACCO ABUSE   HYPERTENSION, BENIGN ESSENTIAL   Atrial fibrillation with RVR (HCC)   PAD (peripheral artery disease) (HCC)   Abdominal aortic aneurysm (AAA) (HCC)   Hyperlipemia   Aspiration into airway   Endotracheal tube present   Acute on chronic respiratory failure (HCC)   Protein-calorie malnutrition, severe   Pressure injury of skin   B12 deficiency   Hx of embolic stroke   Dysphagia due to recent cerebrovascular accident   VAP (ventilator-associated pneumonia) (HCC)  Thrombocytosis (HCC)   Fe deficiency anemia   Fever   Tachycardia   Tachypnea   1.  Left embolic stroke with history of CVA.  Patient was treated with TPA stent is now on Eliquis which was started December 26  2.  A. fib with rapid ventricular rate now controlled with amiodarone and Cardizem p.o. as well as metoprolol  Delirium due to acute metabolic encephalopathy that has resolved  4.  Enterobacter UTI patient has completed antibiotics on February 21, 2018  5.  Left hand pain.   he continued to complain of increased pain and that Tylenol was not helping so I ordered tramadol for him and I have also ordered Doppler ultrasound   Code Status: Full  Severity of Illness: The appropriate patient status for this patient is INPATIENT. Inpatient status is judged to be reasonable and necessary in order to provide the required intensity of service to ensure the patient's safety. The patient's presenting symptoms, physical exam findings, and initial radiographic and laboratory data in the context of their chronic comorbidities is felt to place them at high risk for further clinical deterioration. Furthermore, it is not anticipated that the patient will be medically stable for discharge from the hospital within 2 midnights of admission. The following factors support the patient status of inpatient.  Patient has had complicated course and is waiting nursing placement * I certify that at the point of admission it is my clinical judgment that the patient will require inpatient hospital care spanning beyond 2 midnights from the point of admission due to  high intensity of service, high risk for further deterioration and high frequency of surveillance required.*    Family Communication: None at bedside  Disposition Plan: SNF   Consultants:  General surgery   PCCM   neurology   hematology   palliative care  Procedures:  Intubation  Tracheostomy  PEG  placement  Antimicrobials:  Completed antibiotics  DVT prophylaxis: Eliquis   Objective: Vitals:   02/25/19 0335 02/25/19 0616 02/25/19 0805 02/25/19 1146  BP: 112/86 (!) 122/101 115/83 111/85  Pulse: 84 91 (!) 54 77  Resp: 16 16 20 18   Temp:   98.1 F (36.7 C)   TempSrc: Axillary  Oral   SpO2: 99% 97% 97% 97%  Weight:      Height:        Intake/Output Summary (Last 24 hours) at 02/25/2019 1239 Last data filed at 02/25/2019 1051 Gross per 24 hour  Intake 1320 ml  Output 900 ml  Net 420 ml   Filed Weights   02/22/19 0500 02/23/19 0500 02/24/19 0500  Weight: 68 kg 66 kg 66 kg   Body mass index is 21.8 kg/m.  Exam:  . General: 69 y.o. year-old male well developed well nourished in no acute distress.  Alert and oriented x3.  Unshaven male well developed . Cardiovascular: Regular rate and rhythm with no rubs or gallops.  No thyromegaly or JVD noted.   Marland Kitchen Respiratory: Clear to auscultation with no wheezes or rales. Good inspiratory effort. . Abdomen: Soft nontender nondistended with normal bowel sounds x4 quadrants. . Musculoskeletal: No lower extremity edema. 2/4 pulses in all 4 extremities..  The left hand did not feel cold it was warm no tenderness to palpation but patient did complain it was painful . Skin: No ulcerative lesions noted or rashes, . Psychiatry: Mood is appropriate for condition and setting . Neurology patient was able to verbalize a few words    Data Reviewed: CBC: Recent Labs  Lab 02/19/19 0611 02/20/19 0713  WBC 9.4 7.9  NEUTROABS 6.4 4.7  HGB 13.5 13.5  HCT 39.8 41.1  MCV 100.5* 102.8*  PLT 244 123456   Basic Metabolic Panel: No results for input(s): NA, K, CL, CO2, GLUCOSE, BUN, CREATININE, CALCIUM, MG, PHOS in the last 168 hours. GFR: Estimated Creatinine Clearance: 81.4 mL/min (by C-G formula based on SCr of 0.76 mg/dL). Liver Function Tests: No results for input(s): AST, ALT, ALKPHOS, BILITOT, PROT, ALBUMIN in the last 168 hours. No  results for input(s): LIPASE, AMYLASE in the last 168 hours. No results for input(s): AMMONIA in the last 168 hours. Coagulation Profile: No results for input(s): INR, PROTIME in the last 168 hours. Cardiac Enzymes: No results for input(s): CKTOTAL, CKMB, CKMBINDEX, TROPONINI in the last 168 hours. BNP (last 3 results) No results for input(s): PROBNP in the last 8760 hours. HbA1C: No results for input(s): HGBA1C in the last 72 hours. CBG: Recent Labs  Lab 02/19/19 2059  GLUCAP 72   Lipid Profile: No results for input(s): CHOL, HDL, LDLCALC, TRIG, CHOLHDL, LDLDIRECT in the last 72 hours. Thyroid Function Tests: No results for input(s): TSH, T4TOTAL, FREET4, T3FREE, THYROIDAB in the last 72 hours. Anemia Panel: No results for input(s): VITAMINB12, FOLATE, FERRITIN, TIBC, IRON, RETICCTPCT in the last 72 hours. Urine analysis:    Component Value Date/Time   COLORURINE YELLOW 02/16/2019 1345   APPEARANCEUR HAZY (A) 02/16/2019 1345   LABSPEC 1.012 02/16/2019 1345   PHURINE 7.0 02/16/2019 1345   GLUCOSEU NEGATIVE 02/16/2019 1345   GLUCOSEU NEGATIVE  12/13/2012 [place] 02/16/2019 1345   [place] 02/16/2019 1345   KETONESUR NEGATIVE 02/16/2019 1345   PROTEINUR NEGATIVE 02/16/2019 1345   UROBILINOGEN 0.2 12/13/2012 1514   NITRITE POSITIVE (A) 02/16/2019 1345   LEUKOCYTESUR LARGE (A) 02/16/2019 1345   Sepsis Labs: @LABRCNTIP (procalcitonin:4,lacticidven:4)  ) Recent Results (from the past 240 hour(s))  Culture, blood (routine x 2)     Status: None   Collection Time: 02/16/19 12:00 PM   Specimen: BLOOD  Result Value Ref Range Status   Specimen Description BLOOD RIGHT ANTECUBITAL  Final   Special Requests   Final    BOTTLES DRAWN AEROBIC ONLY Blood Culture adequate volume   Culture   Final    NO GROWTH 5 DAYS Performed at [facility], [place] [address] [place], [place] [address]    Report Status 02/21/2019 FINAL  Final  Culture, blood (routine x  2)     Status: None   Collection Time: 02/16/19 12:26 PM   Specimen: BLOOD RIGHT ARM  Result Value Ref Range Status   Specimen Description BLOOD RIGHT ARM  Final   Special Requests   Final    BOTTLES DRAWN AEROBIC ONLY Blood Culture adequate volume   Culture   Final    NO GROWTH 5 DAYS Performed at [facility], [place] [address] [place], [place] [address]    Report Status 02/21/2019 FINAL  Final  Culture, Urine     Status: Abnormal   Collection Time: 02/16/19  1:20 PM   Specimen: Urine, Catheterized  Result Value Ref Range Status   Specimen Description URINE, CATHETERIZED  Final   Special Requests   Final    NONE Performed at [facility], [place] [address], [place], [place] [address]    Culture >=100,000 COLONIES/mL ENTEROBACTER CLOACAE (A)  Final   Report Status 02/19/2019 FINAL  Final   Organism ID, Bacteria ENTEROBACTER CLOACAE (A)  Final      Susceptibility   Enterobacter cloacae - MIC*    CEFAZOLIN >=64 RESISTANT Resistant     CIPROFLOXACIN <=0.25 SENSITIVE Sensitive     GENTAMICIN <=1 SENSITIVE Sensitive     IMIPENEM <=0.25 SENSITIVE Sensitive     NITROFURANTOIN <=16 SENSITIVE Sensitive     TRIMETH/SULFA <=20 SENSITIVE Sensitive     PIP/TAZO >=128 RESISTANT Resistant     * >=100,000 COLONIES/mL ENTEROBACTER CLOACAE      Studies: No results found.  Scheduled Meds: . amiodarone  200 mg Per Tube Daily  . apixaban  5 mg Per Tube BID  . atorvastatin  80 mg Oral q1800  . diltiazem  30 mg Oral Q6H  . divalproex  500 mg Oral Q12H  . feeding supplement (OSMOLITE 1.5 CAL)  474 mL Per Tube TID PC  . feeding supplement (PRO-STAT SUGAR FREE 64)  30 mL Per Tube BID  . folic acid  2 mg Oral Daily  . mouth rinse  15 mL Mouth Rinse BID  . metoprolol tartrate  100 mg Per Tube BID  . QUEtiapine  150 mg Oral QHS  . QUEtiapine  50 mg Per Tube Daily  . sennosides  5 mL Per Tube BID  . vitamin B-12  1,000 mcg Per Tube Daily    Continuous Infusions: . sodium chloride  10 mL/hr at 02/01/19 0541     LOS: 38 days     [name] Deer, MD [facility]  To reach me or the doctor on call, go to: [email or web address] Password [email or web address]  02/25/2019, 12:39 PM

## 2019-02-25 NOTE — Progress Notes (Signed)
Pt left hand is red/ruddy in color. It is cool to touch; pt c/o severe pain withdraws when palpated. Capillary refill is very sluggish requiring greater than 10 seconds. Left hand remains pale and cool intermittently throughout the day (see focus LUE vascular assessment).  When asked list of pain descriptions, pt answered yes to tingling and pins and needles. Tylenol given with mild relief. MD notified; new order for doppler study and new order for pain medication obtained.

## 2019-02-26 ENCOUNTER — Inpatient Hospital Stay (HOSPITAL_COMMUNITY): Payer: Medicare Other

## 2019-02-26 ENCOUNTER — Encounter (HOSPITAL_COMMUNITY): Payer: Medicare Other

## 2019-02-26 DIAGNOSIS — I998 Other disorder of circulatory system: Secondary | ICD-10-CM

## 2019-02-26 DIAGNOSIS — I739 Peripheral vascular disease, unspecified: Secondary | ICD-10-CM

## 2019-02-26 DIAGNOSIS — I714 Abdominal aortic aneurysm, without rupture: Secondary | ICD-10-CM

## 2019-02-26 LAB — MRSA PCR SCREENING: MRSA by PCR: NEGATIVE

## 2019-02-26 NOTE — Progress Notes (Signed)
Physical Therapy Treatment Patient Details Name: NICKOLAOS BOGARD MRN: [id number] DOB: 1950-02-20 Today's Date: 02/26/2019    History of Present Illness 33yM admitted 01/20/19 with hypertensive emergency in setting of acute L MCA stroke s/p tPA, AF/RVR.  Has been noncompliant with his warfarin for A. Fib. Intubated for respiratory failure. Trach 01/29/19 with PEG planned for 01/30/19. No sedation at time of PT eval.     PT Comments    Pt lethargic on arrival, quickly became more alert to name.  Pt needing direction /redirection cues to stay on task.  Follow 1-step commands with repetition.  Dynamic balance deficits noted, but pt has improved with transitions, transfers and gait with the RW.    Follow Up Recommendations  SNF;Supervision/Assistance - 24 hour     Equipment Recommendations  Other (comment)    Recommendations for Other Services       Precautions / Restrictions Precautions Precautions: Fall    Mobility  Bed Mobility Overal bed mobility: Needs Assistance Bed Mobility: Supine to Sit     Supine to sit: Min assist     General bed mobility comments: stability and directional assist  Transfers Overall transfer level: Needs assistance Equipment used: Rolling walker (2 wheeled) Transfers: Sit to/from Stand Sit to Stand: Min assist         General transfer comment: cues for hand placement.  assist to come forward and upright, hands to RW.  Ambulation/Gait Ambulation/Gait assistance: Min assist Gait Distance (Feet): 150 Feet Assistive device: Rolling walker (2 wheeled) Gait Pattern/deviations: Step-through pattern Gait velocity: decr Gait velocity interpretation: 1.31 - 2.62 ft/sec, indicative of limited community ambulator General Gait Details: shuffled or low amplitude steps, variable, flexed posture.  assist to maneuver the RW, postural checks and general stability.   Stairs             Wheelchair Mobility    Modified Rankin (Stroke Patients  Only) Modified Rankin (Stroke Patients Only) Modified Rankin: Moderately severe disability     Balance Overall balance assessment: Needs assistance   Sitting balance-Leahy Scale: Fair Sitting balance - Comments: Min guard for safety     Standing balance-Leahy Scale: Poor Standing balance comment: reliant on external support.                            Cognition Arousal/Alertness: Awake/alert Behavior During Therapy: Flat affect Overall Cognitive Status: (NT formally, definitely impaired.)                                        Exercises      General Comments        Pertinent Vitals/Pain Pain Assessment: Faces Faces Pain Scale: Hurts a little bit Pain Location: left hand Pain Intervention(s): Monitored during session    Home Living                      Prior Function            PT Goals (current goals can now be found in the care plan section) Acute Rehab PT Goals PT Goal Formulation: With patient Time For Goal Achievement: 02/27/19 Potential to Achieve Goals: Fair Progress towards PT goals: Progressing toward goals    Frequency    Min 3X/week      PT Plan Current plan remains appropriate    Co-evaluation  AM-PAC PT "6 Clicks" Mobility   Outcome Measure  Help needed turning from your back to your side while in a flat bed without using bedrails?: A Little Help needed moving from lying on your back to sitting on the side of a flat bed without using bedrails?: A Little Help needed moving to and from a bed to a chair (including a wheelchair)?: A Little Help needed standing up from a chair using your arms (e.g., wheelchair or bedside chair)?: A Little Help needed to walk in hospital room?: A Lot Help needed climbing 3-5 steps with a railing? : Total 6 Click Score: 15    End of Session   Activity Tolerance: Patient tolerated treatment well Patient left: in chair;with call bell/phone within  reach;Other (comment)("posey" belt alarm used) Nurse Communication: Mobility status PT Visit Diagnosis: Hemiplegia and hemiparesis;Other abnormalities of gait and mobility (R26.89);Other symptoms and signs involving the nervous system (R29.898) Hemiplegia - Right/Left: Right Hemiplegia - dominant/non-dominant: Dominant Hemiplegia - caused by: Cerebral infarction     Time: [id number] PT Time Calculation (min) (ACUTE ONLY): 21 min  Charges:  $Gait Training: 8-22 mins                     02/26/2019  [name], PT Acute Rehabilitation Services [phone]  (pager) [phone]  (office)   [name] Jaun Galluzzo 02/26/2019, 4:44 PM

## 2019-02-26 NOTE — Plan of Care (Signed)

## 2019-02-26 NOTE — Progress Notes (Addendum)
PROGRESS NOTE    [name]  [id number] DOB: Jun 02, 1950 DOA: 01/18/2019 PCP: Shon Baton, MD   Brief Narrative: 69 year old man with A. fib on Coumadin-who had recently missed doses hypertension, diabetes, previous history of, COPD, AAA, hypertension, hyperlipidemia, pulmonary nodule, depression brought to the ER with change in mental status, aphasia, leaning to the right found to be nonverbal and found to have acute left MCA CVA.  Patient was admitted received thrombolytics in the ED and required intubation for airway protection.  Patient completed stroke work-up.  He was treated for ventilation pneumonia/sputum grew streptococcal pneumonia and Enterobacter cloacae and was treated with antibiotics.  He had difficulty weaning from vent and subsequently needed tracheostomy and PEG tube placement.  Patient also had A. fib with RVR, B12 deficiency, iron deficiency. 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 Patient was subsequently switched to Eliquis.  Significant events: 12/10 tPA administered 12/11 intubated 12/21 Tracheostomy 12/22 PEG placed 01/04 patient pulled out trach >> left out 1/6-transferred to [facility] service  Subjective: Seen and examined this morning. Resting comfortably on room air, alert awake, slurred speech or difficulty comprehending.able to tell me his name date of birth but not current location. Blood Pressure stable in 110s to 130s, 90% room air, afebrile  overnight.  No additional labs. No other Events.  Assessment & Plan:   Acute stroke with left embolic stroke with history of CVA: Left MCA infarct status post TPA 12/10. completed stroke work-up as above.  eliquis 5 mg twice daily, Lipitor 80 mg  Atrial fibrillation-rate controlled on amiodarone 200 mg daily, Cardizem 30 mg every 6 hour metoprolol 100, twice daily, Eliquis.  Prior to admission was on Coumadin apparently had missed doses.   B12 deficiency/iron deficiency:B12 level improved to 644 from 161.  Continue folic acid.    Enterobacter [id number] antibiotics 1/14  Delirium/acute metabolic encephalopathy/chronic benzo use: Appears more or less stable, alert awake able to tell me his name and date of birth.  Asking for blanket.  Continue Seroquel 50 daily and 150 bedtime.  GERD.  Acute hypoxic respiratory failure: Resolved.  Initially intubated, currently on room air.  Underwent tracheostomy 12/21 and he pulled out 1/4.  [name] site has healed.  Ventilator associated pneumonia sputum with history of pneumonia and Enterobacter Scalisi and was treated with antibiotic  Dysphagia with severe protein calorie malnutrition: On PEG tube.  Feeding as nutrition/SLP, also on dysphagia 1 diet.With honey thick consistency.  Left hand pain on tramadol. Doppler was ordered.He had cold arm and unable to Doppler pulse on 12/20 also on 12/20-had arterial thrombus in the left axillary and brachial arteries.  Patient already on Eliquis twice daily 5 mg.  Addendum- called by [place] tech " has an occlusion of the left subclavian, axillary, brachial, ulnar, and radial arteries, and there is no flow noted in the left palm. There was a study done 01/28/19 that also showed acute occlusion of the left axillary and brachial arteries" I came and examined pt again- some pain on left hand,  equal grip on b/l hands- weak-some discoloration on palm, decreased cap fills- consulted Dr Eden Lathe from vascular- advised to hold off  eliquis dose tonight, he will see patient soon. I spoke w RN. He had just finished his meal.  Discolored right Grt Toe- present for several days per RN, has chronic wound on adjacent toe: Palpable post tibialis rt- Check duplex ABI.Monitor distal pulse- asked Rn to doppler it.  Nutrition: per RN pt eating his 100% of meal- Dietition consulted to see if PEG feeding can be stopped Nutrition Problem: Severe Malnutrition Etiology: chronic illness(COPD) Signs/Symptoms: severe fat depletion, severe muscle depletion Interventions: Tube feeding  Pressure Ulcer: Pressure Injury 02/01/19 Sacrum Stage 2 -  Partial thickness loss of dermis presenting as a shallow open injury with a red, pink wound bed without slough. (Active)  02/01/19 1417  Location: Sacrum  Location Orientation:   Staging: Stage 2 -  Partial thickness loss of dermis presenting as a shallow open injury with a red, pink wound bed without slough.  Wound Description (Comments):   Present on Admission:     Body mass index is 21.8 kg/m.    DVT prophylaxis: eliquis Code Status: full Family Communication: plan of care discussed with patient at bedside. Disposition Plan: Remains inpatient pending further skilled nursing facility placement   Consultants: PCCM,Neurology,Hematology, palliative care,General surgery Procedures: TPA, intubation, tracheostomy, PEG tube placement Microbiology: Antimicrobials: Anti-infectives (From admission, onward)   Start     Dose/Rate Route Frequency Ordered Stop   02/16/19 1045  cefTRIAXone (ROCEPHIN) 1 g in sodium chloride 0.9 % 100 mL IVPB     1 g 200 mL/hr over 30 Minutes Intravenous Every 24 hours 02/16/19 1042 02/22/19 1232   02/12/19 1800  cefTRIAXone (ROCEPHIN) 1 g in sodium chloride 0.9 % 100 mL IVPB     1 g 200 mL/hr over 30 Minutes Intravenous  Once 02/12/19 1348 02/13/19 0412   02/10/19 1430  cefTRIAXone (ROCEPHIN) 1 g in sodium chloride 0.9 % 100 mL IVPB     1 g 200 mL/hr over 30  Minutes Intravenous Every 24 hours 02/10/19 1347 02/11/19 1754   01/26/19 1000  ceFEPIme (MAXIPIME) 2 g in sodium chloride 0.9 % 100 mL IVPB     2 g 200 mL/hr over 30 Minutes Intravenous Every 8 hours 01/26/19 0955 02/02/19 0145   01/25/19 1000  Ampicillin-Sulbactam (UNASYN) 3 g in sodium chloride 0.9 % 100 mL IVPB  Status:  Discontinued     3 g 200 mL/hr over 30 Minutes Intravenous Every 8 hours 01/25/19 0810 01/26/19 0955     Objective: Vitals:   02/25/19 1615 02/25/19 2031 02/25/19 2325 02/26/19 0508  BP: 130/85 (!) 132/97 111/74 132/86  Pulse:  66 69 64  Resp:   20 19  Temp: 98.4 F (36.9 C) 98.3 F (36.8 C)    TempSrc:  Oral    SpO2:   96% 97%  Weight:      Height:        Intake/Output Summary (Last 24 hours) at 02/26/2019 0730 Last data filed at 02/25/2019 2332 Gross per 24 hour  Intake 1680 ml  Output 1300 ml  Net 380 ml   Filed Weights   02/22/19 0500 02/23/19 0500 02/24/19 0500  Weight: 68 kg 66 kg 66 kg   Weight change:   Body mass index is 21.8 kg/m.  Intake/Output from previous day: 01/17 0701 - 01/18 0700 In: 1680 [P.O.:1680] Out: 1300 [Urine:1300] Intake/Output this shift: No intake/output data recorded.  Examination:  General exam: AAO to self, place.  Older than his stated age, weak and frail.   HEENT:Oral mucosa moist, Ear/Nose WNL grossly, dentition normal. Respiratory system: Diminished at the base,no wheezing or crackles,no use of accessory muscle Cardiovascular system: S1 & S2 +, No JVD,. Gastrointestinal system: Abdomen soft, NT,ND, BS+ Nervous System:Alert, awake, slurred speech, able to tell me his name and date of birth-unable to tell me date or locations. He is moving extremities. Extremities: No edema. Left arm pulse difficulty to feel. RN reported feeble pulse. Skin: No rashes,no icterus. MSK: Normal muscle bulk,tone, power  Medications:  Scheduled Meds: . amiodarone  200 mg Per Tube Daily  . apixaban  5 mg Per Tube BID  .  atorvastatin  80 mg Oral q1800  . diltiazem  30 mg Oral Q6H  . divalproex  500 mg Oral Q12H  . feeding supplement (OSMOLITE 1.5 CAL)  474 mL Per Tube TID PC  . feeding supplement (PRO-STAT SUGAR FREE 64)  30 mL Per Tube BID  . folic acid  2 mg Oral Daily  . mouth rinse  15 mL Mouth Rinse BID  . metoprolol tartrate  100 mg Per Tube BID  . QUEtiapine  150 mg Oral QHS  . QUEtiapine  50 mg Per Tube Daily  . sennosides  5 mL Per Tube BID  . vitamin B-12  1,000 mcg Per Tube Daily   Continuous Infusions: . sodium chloride 10 mL/hr at 02/01/19 0541    Data Reviewed: I have personally reviewed following labs and imaging studies  CBC: Recent Labs  Lab 02/20/19 0713  WBC 7.9  NEUTROABS 4.7  HGB 13.5  HCT 41.1  MCV 102.8*  PLT [id number]   Basic Metabolic Panel: No results for input(s): NA, K, CL, CO2, GLUCOSE, BUN, CREATININE, CALCIUM, MG, PHOS in the last 168 hours. GFR: Estimated Creatinine Clearance: 81.4 mL/min (by C-G formula based on SCr of 0.76 mg/dL). Liver Function Tests: No results for input(s): AST, ALT, ALKPHOS, BILITOT, PROT, ALBUMIN in the last 168 hours. No results for input(s): LIPASE, AMYLASE in the last 168 hours. No results for input(s): AMMONIA in the last 168 hours. Coagulation Profile: No results for input(s): INR, PROTIME in the last 168 hours. Cardiac Enzymes: No results for input(s): CKTOTAL, CKMB, CKMBINDEX, TROPONINI in the last 168 hours. BNP (last 3 results) No results for input(s): PROBNP in the last 8760 hours. HbA1C: No results for input(s): HGBA1C in the last 72 hours. CBG: Recent Labs  Lab 02/19/19 2059  GLUCAP 72   Lipid Profile: No results for input(s): CHOL, HDL, LDLCALC, TRIG, CHOLHDL, LDLDIRECT in the last 72 hours. Thyroid Function Tests: No results for input(s): TSH, T4TOTAL, FREET4, T3FREE, THYROIDAB in the last 72 hours. Anemia Panel: No results for input(s): VITAMINB12, FOLATE, FERRITIN, TIBC, IRON, RETICCTPCT in the last 72 hours.  Sepsis Labs: No results for input(s): PROCALCITON, LATICACIDVEN in the last 168 hours.  Recent Results (from the past 240 hour(s))  Culture, blood (routine x 2)     Status: None   Collection Time: 02/16/19 12:00 PM   Specimen: BLOOD  Result Value Ref Range Status   Specimen Description BLOOD RIGHT ANTECUBITAL  Final   Special Requests   Final    BOTTLES DRAWN AEROBIC ONLY Blood Culture adequate volume   Culture   Final    NO GROWTH 5 DAYS Performed at [facility], [address] [place], [place] [address]    Report Status 02/21/2019 FINAL  Final  Culture,  blood (routine x 2)     Status: None   Collection Time: 02/16/19 12:26 PM   Specimen: BLOOD RIGHT ARM  Result Value Ref Range Status   Specimen Description BLOOD RIGHT ARM  Final   Special Requests   Final    BOTTLES DRAWN AEROBIC ONLY Blood Culture adequate volume   Culture   Final    NO GROWTH 5 DAYS Performed at [facility], [address], [place], [place] [address]    Report Status 02/21/2019 FINAL  Final  Culture, Urine     Status: Abnormal   Collection Time: 02/16/19  1:20 PM   Specimen: Urine, Catheterized  Result Value Ref Range Status   Specimen Description URINE, CATHETERIZED  Final   Special Requests   Final    NONE Performed at [facility], [place] [address], [place], [place] [address]    Culture >=100,000 COLONIES/mL ENTEROBACTER CLOACAE (A)  Final   Report Status 02/19/2019 FINAL  Final   Organism ID, Bacteria ENTEROBACTER CLOACAE (A)  Final      Susceptibility   Enterobacter cloacae - MIC*    CEFAZOLIN >=64 RESISTANT Resistant     CIPROFLOXACIN <=0.25 SENSITIVE Sensitive     GENTAMICIN <=1 SENSITIVE Sensitive     IMIPENEM <=0.25 SENSITIVE Sensitive     NITROFURANTOIN <=16 SENSITIVE Sensitive     TRIMETH/SULFA <=20 SENSITIVE Sensitive     PIP/TAZO >=128 RESISTANT Resistant     * >=100,000 COLONIES/mL ENTEROBACTER CLOACAE      Radiology Studies: No results found.    LOS: 39  days   Time spent: More than 50% of that time was spent in counseling and/or coordination of care.  Antonieta Pert, MD [facility]  02/26/2019, 7:30 AM

## 2019-02-26 NOTE — Progress Notes (Signed)
ABI completed.  Preliminary results can be found under CV proc under chart review.  02/26/2019 6:30 PM  Zakiyah Diop, K., RDMS, RVT

## 2019-02-26 NOTE — Consult Note (Signed)
Referring Physician: Dr Lupita Leash  Patient name: [name] MRN: [id number] DOB: 02-Jun-1950 Sex: male  REASON FOR CONSULT: left hand ischemia, right first toe ischemia  HPI: [name] is a 69 y.o. male with a lengthy history of left upper extremity revascularization procedures.    He was recently admitted to the hospital with a embolic stroke.  This was on December 10.  The patient has been in the hospital for almost a month at this point secondary to his stroke.  This was a left brain stroke.  He had resulting fairly severe right-sided deficits.  He required tracheostomy and PEG tube.  He does have a history of atrial fibrillation and has been on chronic anticoagulation.  He is currently on Eliquis.  During his admission CT angio of the neck the proximal subclavian was noted to be patent with occlusion of the left vertebral artery.  A duplex ultrasound was ordered during this hospitalization on December 20, approximately 1 month ago.  This showed occlusion of his left axillary and brachial artery.  I cannot find any notes regarding follow-up of this study or why the study was indicated at that point on my review of the patient's medical record.  He subsequently had a repeat upper extremity duplex exam today which again showed occlusion of the left axillary and brachial arteries similar to his exam from 30 days ago.    He has a complicated history with his left upper extremity.  He underwent a left axillary artery to brachial artery bypass with saphenous vein graft in February 2016.    The left axillary and upper brachial artery was not visualized.  He also underwent left brachial to ulnar artery bypass graft during the same setting in February 2016.  He subsequently underwent thrombectomy and revision of this in October 2016.  He was noted to have occlusion of his bypass graft in 2018.  At that point he was continuing to smoke.  He was told at that point that he was not a candidate for any  further left upper extremity revascularizations from the elbow down.  I am not sure how accurate his history is as he seems very confused.  However he states that his left hand does not hurt anymore currently than it did the last time I saw him in 2018.  As far as his right first toe is concerned it is dark in color.  Again unknown history how long this has been in process.  Patient is not a very good historian but does have some vague history that the right first toe is painful to him.  He does not have enough cognitive ability to discern or follow commands or discuss whether or not he has any claudication symptoms or give any history on how long the toe has been a problem for him.  Past Medical History:  Diagnosis Date  . AAA (abdominal aortic aneurysm) ([place])    per last CT 03-26-2016---  3.9cm  . Anticoagulated on Coumadin   . Arthritis of spine   . Benign localized prostatic hyperplasia with lower urinary tract symptoms (LUTS)   . Chronic atrial fibrillation ([place]) cardiolgoist -- dr Stanford Breed   dx 2009  . Complication of anesthesia    per pt today (07-08-2016) had problem after having anesthesia 12/ 2016 surgery at [facility] "not good" went to ED ( in epic ED visit 01/ 2017 dx severe episode of major depression disorder and admitted to [facility]  . COPD with emphysema ([place])   .  Dyspnea on exertion   . Full dentures   . GERD (gastroesophageal reflux disease)   . Hematuria   . History of amaurosis fugax    right eye 02-08-2015 -- resolved /  documented in epic possible left eye amaurosis fugax 11/ 2008 (pt denies) / per last MRI show previous bilateral occipital lobe infarcts  . History of concussion    as child  . History of CVA (cerebrovascular accident) 10/ 2016  and per pt Dec 2017 in [place]   neurologist-  dr Leonie Man-- per note silent infarcts on MRI-- multiple acute infarcts bilateral cerebullm, right temporal lobe, and bilateral occipital lobes due to embolic phenomena (atrial fib.)  .  History of seizures as a child   . Hyperlipidemia   . Hypertension   . Left arm numbness    occasional numbess post residual axilla arterial occlusion and surgery  . Major depressive disorder    hx severe episode without psychosis 02/2015 w/ homicidal ideation  . Narcotic dependence ([place])   . PAD (peripheral artery disease) ([place])    followed by dr Serrena Linderman-- s/p  left axilla to branchial and left axilla to ulnar bypass graft due to ischemic hand  . Pulmonary nodule    per CT 03-26-2016  right lower lobe  . PVD (peripheral vascular disease) ([place])   . Right ureteral stone   . Senile purpura ([place])   . Weakness of left arm    residual from axilla arterial occlusion and post surgery  . Wears glasses    Past Surgical History:  Procedure Laterality Date  . ARCH AORTOGRAM N/A 04/05/2014   Procedure: ARCH AORTOGRAM, FIRST ORDER CATHETERIZATION LEFT SUBCLAVIAN ARTERY;  Surgeon: Elam Dutch, MD;  Location[facility];  Service: Vascular;  Laterality: N/A;  . AXILLARY-FEMORAL BYPASS GRAFT Left 04/08/2014   Procedure: LEFT AXILLARY ARTERY TO BRACHIAL ARTERY BYPASS USING NON REVERSE LEFT GREATER SAPHENOUS VEIN ,LIGATION OF LEFT AXILLARY ARTERY ANEURYSM;  Surgeon: Elam Dutch, MD;  Location: [place];  Service: Vascular;  Laterality: Left;  . BYPASS AXILLA/BRACHIAL ARTERY Left 01/27/2015   Procedure: LEFT BRACHIAL-ULAR ARTERY BYPASS USING GREATER SAPHENOUS VEIN;  Surgeon: Elam Dutch, MD;  Location: [place];  Service: Vascular;  Laterality: Left;  . CYSTOSCOPY WITH RETROGRADE PYELOGRAM, URETEROSCOPY AND STENT PLACEMENT Right 07/17/2016   Procedure: CYSTOSCOPY WITH RETROGRADE PYELOGRAM, URETEROSCOPY AND STENT PLACEMENT,LASER;  Surgeon: Festus Aloe, MD;  Location: [facility];  Service: Urology;  Laterality: Right;  . CYSTOSCOPY/URETEROSCOPY/HOLMIUM LASER/STENT PLACEMENT Right 07/13/2016   Procedure: CYSTOSCOPY, RIGHT URETEROSCOPY RETROGRADE PYELOGRAM;  Surgeon: Kathie Rhodes, MD;  Location: [facility];  Service: Urology;  Laterality: Right;  . EMBOLECTOMY Left 04/05/2014   Procedure: THROMBO ENDARTERECTOMY OF LEFT BRACHIAL, RADIAL AND ULNAR ARTERY,  Left Radial artery cut down and radial artery thrombectomy.;  Surgeon: Elam Dutch, MD;  Location: [facility] OR;  Service: Vascular;  Laterality: Left;  . ESOPHAGOGASTRODUODENOSCOPY N/A 01/30/2019   Procedure: ESOPHAGOGASTRODUODENOSCOPY (EGD);  Surgeon: Jesusita Oka, MD;  Location: [facility] ENDOSCOPY;  Service: General;  Laterality: N/A;  . facial cyst Right    cyst removal.   . IR REPLC GASTRO/COLONIC TUBE PERCUT W/FLUORO  02/17/2019  . PATCH ANGIOPLASTY Left 04/05/2014   Procedure: LEFT ARM VEIN PATCH ANGIOPLASTY;  Surgeon: Elam Dutch, MD;  Location: [place];  Service: Vascular;  Laterality: Left;  . PEG PLACEMENT N/A 01/30/2019   Procedure: PERCUTANEOUS ENDOSCOPIC GASTROSTOMY (PEG) PLACEMENT;  Surgeon: Jesusita Oka, MD;  Location: [place];  Service: General;  Laterality: N/A;  . PERIPHERAL VASCULAR CATHETERIZATION N/A 01/22/2015   Procedure: Aortic Arch Angiography;  Surgeon: Elam Dutch, MD;  Location: [place] CV LAB;  Service: Cardiovascular;  Laterality: N/A;  . THROMBECTOMY BRACHIAL ARTERY Left 11/20/2014   Procedure: 1.  Thrombectomy Left Axilo-Brachial Bypass  2.  Thromboendarterecotmy of Left Brachial Artery with Fogarty Thrombectomy of Radial and Ulnar Arteries with Dacron patch angioplasty Left Brachial Artery. 3. Intraoperative  Arteriogram times four.;  Surgeon: Mal Misty, MD;  Location: [place];  Service: Vascular;  Laterality: Left;  . TRANSTHORACIC ECHOCARDIOGRAM  11/25/2014   moderate concentric LVH, ef 60-65%, unable to evaluation LVDF due to atrial fib/  mild MR/  severe LAE  . VEIN HARVEST Left 04/08/2014   Procedure: LEFT GREATER SAPHENOUS VEIN HARVEST;  Surgeon: Elam Dutch, MD;  Location: [place];  Service: Vascular;  Laterality: Left;  Marland Kitchen VEIN HARVEST Right 01/27/2015   Procedure: RIGHT  GREATER SAPHENOUS VEIN HARVEST;  Surgeon: Elam Dutch, MD;  Location: [place];  Service: Vascular;  Laterality: Right;    Family History  Problem Relation Age of Onset  . Hypertension Mother   . Lung cancer Mother   . Stomach cancer Father        died age 29  . Cancer Sister   . Early death Neg Hx   . Heart disease Neg Hx   . Hyperlipidemia Neg Hx   . Kidney disease Neg Hx   . Stroke Neg Hx     SOCIAL HISTORY: Social History   Socioeconomic History  . Marital status: Divorced    Spouse name: Not on file  . Number of children: 1  . Years of education: Not on file  . Highest education level: Not on file  Occupational History  . 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 Smoking status: Current Every Day Smoker    Packs/day: 2.00    Years: 51.00    Pack years: 102.00    Types: Cigarettes  . Smokeless tobacco: Never Used  . Tobacco comment: per pt 07-08-2016 down to 2ppd from 4 ppd (quit one time for 4 years since started at age 17)  Substance and Sexual Activity  . Alcohol use: Yes    Alcohol/week: 0.0 standard drinks    Comment: occasional   . Drug use: No    Comment: per pt 07-08-2016 hx drug use stopped 1970's  . Sexual activity: Not on file  Other Topics Concern  . Not on file  Social History Narrative  . Not on file   Social Determinants of Health   Financial Resource Strain:   . Difficulty of Paying Living Expenses: Not on file  Food Insecurity:   . Worried About Charity fundraiser in the Last Year: Not on file  . Ran Out of Food in the Last Year: Not on file  Transportation Needs:   . Lack of Transportation (Medical): Not on file  . Lack of Transportation (Non-Medical): Not on file  Physical Activity:   . Days of Exercise per Week: Not on file  . Minutes of Exercise per Session: Not on file  Stress:   . Feeling of Stress : Not on file  Social Connections:   . Frequency of Communication with Friends and Family: Not  on file  . Frequency of Social Gatherings with Friends and Family: Not on file  . Attends Religious Services: Not on file  . Active Member of Clubs or  Organizations: Not on file  . Attends Archivist Meetings: Not on file  . Marital Status: Not on file  Intimate Partner Violence:   . Fear of Current or Ex-Partner: Not on file  . Emotionally Abused: Not on file  . Physically Abused: Not on file  . Sexually Abused: Not on file    Allergies  Allergen Reactions  . Oxycodone Itching    Current Facility-Administered Medications  Medication Dose Route Frequency Provider Last Rate Last Admin  . 0.9 %  sodium chloride infusion   Intravenous PRN Garvin Fila, MD 10 mL/hr at 02/01/19 0541 Rate Verify at 02/01/19 0541  . acetaminophen (TYLENOL) tablet 650 mg  650 mg Oral Q4H PRN Greta Doom, MD   650 mg at 02/26/19 0515   Or  . acetaminophen (TYLENOL) 160 MG/5ML solution 650 mg  650 mg Per Tube Q4H PRN Greta Doom, MD   650 mg at 02/25/19 1331   Or  . acetaminophen (TYLENOL) suppository 650 mg  650 mg Rectal Q4H PRN Greta Doom, MD      . amiodarone (PACERONE) tablet 200 mg  200 mg Per Tube Daily Agarwala, Einar Grad, MD   200 mg at 02/26/19 1120  . apixaban (ELIQUIS) tablet 5 mg  5 mg Per Tube BID Rosalin Hawking, MD   5 mg at 02/26/19 1120  . atorvastatin (LIPITOR) tablet 80 mg  80 mg Oral q1800 Tomma Rakers, MD   80 mg at 02/25/19 2009  . diltiazem (CARDIZEM) 10 mg/ml oral suspension 30 mg  30 mg Oral Q6H Hollice Gong, Mir Mohammed, MD   30 mg at 02/26/19 1117  . divalproex (DEPAKOTE SPRINKLE) capsule 500 mg  500 mg Oral Q12H Debbe Odea, MD   500 mg at 02/26/19 1119  . feeding supplement (OSMOLITE 1.5 CAL) liquid 474 mL  474 mL Per Tube TID PC Ikramullah, Mir Mohammed, MD      . feeding supplement (PRO-STAT SUGAR FREE 64) liquid 30 mL  30 mL Per Tube BID Debbe Odea, MD   30 mL at 02/26/19 1119  . folic acid (FOLVITE) tablet 2 mg  2 mg Oral  Daily Volanda Napoleon, MD   2 mg at 02/26/19 1119  . haloperidol lactate (HALDOL) injection 2-4 mg  2-4 mg Intravenous Q6H PRN Rizwan, Eunice Blase, MD      . ipratropium-albuterol (DUONEB) 0.5-2.5 (3) MG/3ML nebulizer solution 3 mL  3 mL Nebulization Q6H PRN Margaretha Seeds, MD      . MEDLINE mouth rinse  15 mL Mouth Rinse BID Cristal Deer, MD   15 mL at 02/26/19 1126  . metoprolol tartrate (LOPRESSOR) 25 mg/10 mL oral suspension 100 mg  100 mg Per Tube BID Spero Geralds, MD   100 mg at 02/26/19 1117  . metoprolol tartrate (LOPRESSOR) injection 5 mg  5 mg Intravenous Q4H PRN Debbe Odea, MD      . QUEtiapine (SEROQUEL) tablet 150 mg  150 mg Oral QHS Debbe Odea, MD   150 mg at 02/25/19 2113  . QUEtiapine (SEROQUEL) tablet 50 mg  50 mg Per Tube Daily Debbe Odea, MD   50 mg at 02/26/19 1121  . Resource ThickenUp Clear   Oral PRN Hollice Gong, Mir Mohammed, MD      . sennosides (SENOKOT) 8.8 MG/5ML syrup 5 mL  5 mL Per Tube BID Rosalin Hawking, MD   5 mL at 02/26/19 1120  . vitamin B-12 (CYANOCOBALAMIN) tablet 1,000 mcg  1,000 mcg Per Tube  Daily Debbe Odea, MD   1,000 mcg at 02/26/19 1120    ROS:   Unable to obtain due to patient's confusion  Physical Examination  Vitals:   02/26/19 0508 02/26/19 0800 02/26/19 1237 02/26/19 1655  BP: 132/86 124/80 126/82 125/87  Pulse: 64 63 70 62  Resp: 19 14 16 18   Temp:  98 F (36.7 C) 97.8 F (36.6 C) 98.1 F (36.7 C)  TempSrc:   Oral Oral  SpO2: 97% 100% 99% 99%  Weight:      Height:        Body mass index is 21.8 kg/m.  General:  Alert and oriented to name only, no acute distress HEENT: Normal Neck: No JVD Pulmonary: Clear to auscultation bilaterally Cardiac: Irregularly irregular  abdomen: Soft, non-tender, non-distended, pulsatile epigastric mass consistent with approximately 4 cm abdominal aortic aneurysm Skin: No rash, right first toe dark most likely early gangrenous changes, dry, left hand has slightly delayed capillary  compared to the right hand.  However fingers overall are fairly pink and not really much different from the right side.  There are no ulcers on the hand.  He has 5/5 motor strength in the right hand.  He is not really able to move the right upper extremity very well due to a flexion contracture most likely secondary to his stroke. Extremity Pulses:  2+ radial, brachial right side, 2+ bilateral femoral, 2+ bilateral popliteal pulses absent right and left dorsalis pedis, 2+ right posterior tibial absent left posterior tibial pulse, absent left brachial ulnar radial pulses Musculoskeletal: No deformity or edema  Neurologic: Upper and lower extremity motor 5/5 and symmetric  DATA:  CT angiogram findings as noted above additionally no significant carotid artery stenosis  I also reviewed the patient's CT scan of the abdomen and pelvis dated April 2018.  Despite the fact that his medical records suggest he has an abdominal aortic aneurysm he had a normal diameter aorta and iliac arteries on his April 2018 CT scan of the abdomen and pelvis.  He did have a chest CT in February 2018 which shows a 3.9 cm ascending, not abdominal aortic aneurysm   ASSESSMENT:  1.  Right first toe ischemia in a patient with a palpable posterior tibial pulse early dry gangrenous skin changes.  The patient should have adequate perfusion for healing of this.  In light of the patient's current debilitated state I would allow this toe to demarcate.  He does not need an arteriogram as he has palpable pulses in his foot.  Would protect the foot and try to minimize further trauma or any pressors.  I will arrange follow-up to see him regarding this in 4 to 6 weeks.  He apparently had ABIs performed this afternoon and those were not available for review today but I would suggest that he has a normal ABI on the right side.  Left ABI may be slightly decreased but is certainly asymptomatic.  2.  Abdominal aortic aneurysm.  Patient has an error  in his medical record.  It is being perpetuated through epic that he has an abdominal aortic aneurysm.  He does not.  CT scan in 2018 showed no evidence of abdominal aortic aneurysm.  He does have a 3.9 cm ascending aortic aneurysm.  This can be followed by cardiology.  He should have his medical record corrected so that it does not reflect that he has an abdominal aortic aneurysm and that he has an a sending aortic aneurysm.  3.  Left  upper extremity ischemia.  History and exam are consistent with findings that were noted in 2018 with no real significant change.  This most likely represents chronic occlusion of the axillary and brachial arteries on the left side.  He does not currently have an eminently threatened left upper extremity.  He is not really a good candidate for any revascularization.  He should try to avoid smoking.  Again I do not believe this is an acute embolic event and represents chronic ischemia.  Would only consider a left upper extremity arteriogram or further intervention if he develops progressive ischemic changes with ulcerations and imminent limb loss in the left hand.  He currently does not exhibit any of this.  Patient can resume his anticoagulation.  No vascular surgical interventions currently being considered.   Ruta Hinds, MD [facility] Office: [phone] Pager: [phone]

## 2019-02-26 NOTE — TOC Progression Note (Signed)
Transition of Care [facility]) - Progression Note    Patient Details  Name: [name] MRN: [id number] Date of Birth: Sep 26, 1950  [facility]) CM/SW Contact  Oren Section Cleta Alberts, RN Phone Number: 02/26/2019, 3:01 PM  Clinical Narrative:   Notified by Harriet Pho that [facility] has declined bed offer for patient.  Currently no other SNF bed offers.     Expected Discharge Plan: [place] Barriers to Discharge: SNF Pending bed offer, Continued Medical Work up  Expected Discharge Plan and Services Expected Discharge Plan: [place] In-house Referral: Clinical Social Work     Living arrangements for the past 2 months: Single Family Home                                       Social Determinants of Health (SDOH) Interventions    Readmission Risk Interventions No flowsheet data found.  Reinaldo Raddle, RN, BSN  Trauma/Neuro ICU Case Manager [phone]

## 2019-02-26 NOTE — Progress Notes (Signed)
VASCULAR LAB PRELIMINARY  PRELIMINARY  PRELIMINARY  PRELIMINARY  Right upper extremity duplex completed.    Preliminary report:  See CV proc for preliminary results.   Amberleigh Gerken, RVT 02/26/2019, 3:55 PM

## 2019-02-27 LAB — BASIC METABOLIC PANEL
Anion gap: 9 (ref 5–15)
BUN: 21 mg/dL (ref 8–23)
CO2: 27 mmol/L (ref 22–32)
Calcium: 9.2 mg/dL (ref 8.9–10.3)
Chloride: 106 mmol/L (ref 98–111)
Creatinine, Ser: 0.8 mg/dL (ref 0.61–1.24)
GFR calc Af Amer: >60 mL/min (ref >60)
GFR calc non Af Amer: >60 mL/min (ref >60)
Glucose, Bld: 82 mg/dL (ref 70–99)
Potassium: 4.3 mmol/L (ref 3.5–5.1)
Sodium: 142 mmol/L (ref 135–145)

## 2019-02-27 NOTE — Progress Notes (Signed)
Physical Therapy Treatment Patient Details Name: REINHART SAULTERS MRN: [id number] DOB: 31-Jan-1951 Today's Date: 02/27/2019    History of Present Illness 32yM admitted 01/20/19 with hypertensive emergency in setting of acute L MCA stroke s/p tPA, AF/RVR.  Has been noncompliant with his warfarin for A. Fib. Intubated for respiratory failure. Trach 01/29/19 with PEG planned for 01/30/19. No sedation at time of PT eval.     PT Comments    Pt following simple commands with cuing well.  He has trouble making his wishes known verbally, but seems to understand all simple questions/commands.  Emphasis on transitions to EOB, sit to stand, gait with the RW and cues to redirect to task without assist.    Follow Up Recommendations  SNF;Supervision/Assistance - 24 hour     Equipment Recommendations  Other (comment)    Recommendations for Other Services       Precautions / Restrictions Precautions Precautions: Fall Precaution Comments: multiple lines    Mobility  Bed Mobility Overal bed mobility: Needs Assistance       Supine to sit: Min guard     General bed mobility comments: pt came straight up to sitting and in midst of scooting without assist lost his condom cath..  No assist needed though pt is not well coordinated.  Transfers Overall transfer level: Needs assistance Equipment used: Rolling walker (2 wheeled) Transfers: Sit to/from Stand Sit to Stand: Min assist Stand pivot transfers: Min assist       General transfer comment: cues for hand placement and stability assist  Ambulation/Gait Ambulation/Gait assistance: Min assist Gait Distance (Feet): 300 Feet Assistive device: Rolling walker (2 wheeled) Gait Pattern/deviations: Step-through pattern Gait velocity: decr Gait velocity interpretation: <1.8 ft/sec, indicate of risk for recurrent falls General Gait Details: pt mostly needing min guard, but doesn't move RW coordinatedly and occasionally needs maneuvering  assist.  Also needs help getting R hand back on RW and cues for redirection to task.   Stairs             Wheelchair Mobility    Modified Rankin (Stroke Patients Only) Modified Rankin (Stroke Patients Only) Modified Rankin: Moderately severe disability(progressing toward 3/5)     Balance Overall balance assessment: Needs assistance Sitting-balance support: No upper extremity supported;Single extremity supported Sitting balance-Leahy Scale: Fair       Standing balance-Leahy Scale: Fair Standing balance comment: safest with external support, but can maintain stance without AD or external support.                            Cognition Arousal/Alertness: Awake/alert Behavior During Therapy: Flat affect Overall Cognitive Status: Difficult to assess                                        Exercises      General Comments General comments (skin integrity, edema, etc.): vss,  pt drank cihilled honey thicken Coke without over incident/coughing.  Often, the pulse ox sensor does not give a great reading.      Pertinent Vitals/Pain Pain Assessment: Faces Faces Pain Scale: Hurts a little bit Pain Location: left hand    Home Living                      Prior Function            PT Goals (current goals  can now be found in the care plan section) Acute Rehab PT Goals Patient Stated Goal: none stated PT Goal Formulation: With patient Time For Goal Achievement: 02/27/19 Potential to Achieve Goals: Fair Progress towards PT goals: Progressing toward goals;Goals met and updated - see care plan    Frequency    Min 3X/week      PT Plan Current plan remains appropriate    Co-evaluation              AM-PAC PT "6 Clicks" Mobility   Outcome Measure  Help needed turning from your back to your side while in a flat bed without using bedrails?: A Little Help needed moving from lying on your back to sitting on the side of a flat bed  without using bedrails?: A Little Help needed moving to and from a bed to a chair (including a wheelchair)?: A Little Help needed standing up from a chair using your arms (e.g., wheelchair or bedside chair)?: A Little Help needed to walk in hospital room?: A Little Help needed climbing 3-5 steps with a railing? : A Lot 6 Click Score: 17    End of Session   Activity Tolerance: Patient tolerated treatment well Patient left: in chair;with call bell/phone within reach;Other (comment)("posey" alarm belt.) Nurse Communication: Mobility status PT Visit Diagnosis: Hemiplegia and hemiparesis;Other abnormalities of gait and mobility (R26.89);Other symptoms and signs involving the nervous system (R29.898) Hemiplegia - Right/Left: Right Hemiplegia - dominant/non-dominant: Dominant Hemiplegia - caused by: Cerebral infarction     Time: 9198-0221 PT Time Calculation (min) (ACUTE ONLY): 23 min  Charges:  $Gait Training: 8-22 mins $Therapeutic Activity: 8-22 mins                     02/27/2019  [name], PT Acute Rehabilitation Services [phone]  (pager) [phone]  (office)   [name] Tashira Torre 02/27/2019, 11:24 AM

## 2019-02-27 NOTE — Progress Notes (Signed)
PROGRESS NOTE    [name]  [id number] DOB: Aug 05, 1950 DOA: 01/18/2019 PCP: Shon Baton, MD   Brief Narrative: 69 year old man with A. fib on Coumadin-who had recently missed doses hypertension, diabetes, previous history of, COPD, AAA, hypertension, hyperlipidemia, pulmonary nodule, depression brought to the ER with change in mental status, aphasia, leaning to the right found to be nonverbal and found to have acute left MCA CVA.  Patient was admitted received thrombolytics in the ED and required intubation for airway protection.  Patient completed stroke work-up.  He was treated for ventilation pneumonia/sputum grew streptococcal pneumonia and Enterobacter cloacae and was treated with antibiotics.  He had difficulty weaning from vent and subsequently needed tracheostomy and PEG tube placement.  Patient also had A. fib with RVR, B12 deficiency, iron deficiency. 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 Patient was subsequently switched to Eliquis.  Significant events: 12/10 tPA administered 12/11 intubated 12/21 Tracheostomy 12/22 PEG placed 01/04 patient pulled out trach >> left out 1/6-transferred to [facility] service 1/18-occlusion of left upper extremity- multiple vessels noted on the duplex vascular surgery was consulted-felt to be old.  Subjective:  Seen this morning resting comfortably alert awake, speech is somewhat slurred difficult to comprehend.  Able to tell me  that he is at [facility] health [facility].  Able to move his name and date of birth. Overnight no fever, blood pressure mostly 120s to 130s, soft at 98 systolic this morning saturating well on room air. Has baseline speech dysarthria, appears poor historian.  Assessment & Plan:   Acute stroke with left embolic stroke with history of CVA: Left MCA infarct status post TPA 12/10. completed stroke work-up as above.  Continue eliquis 5 mg twice daily, Lipitor 80 mg  Atrial fibrillation-likey chronic, rate controlled on amiodarone 200 mg daily, Cardizem 30 mg every 6 hour metoprolol 100, twice daily, Eliquis.  Blood pressure soft x1 today- will monitor and will adjust medication. PTA was on Coumadin apparently had missed doses.   B12 deficiency/iron deficiency:B12 level improved to 644 from 161.  Continue folic acid.    Enterobacter [id number] antibiotics 1/14  Delirium/acute metabolic encephalopathy/chronic benzo use: Appears more or less stable, alert awake able to tell me his name and date of birth.  Asking for blanket.  Continue Seroquel 50 daily and 150 bedtime.  GERD.  Acute hypoxic respiratory failure: Resolved.  Initially intubated, currently on room air.  Underwent tracheostomy 12/21 and he pulled out 1/4.  [name] site has healed.  Ventilator associated pneumonia sputum with history of pneumonia and Enterobacter Scalisi and was treated with antibiotic  Dysphagia with severe protein calorie malnutrition: On PEG tube.  Feeding as nutrition/SLP, also on dysphagia 1 diet.With honey thick consistency.  Peripheral vascular disease/left upper extremity ischemia: has history of left axillary artery to brachial artery bypass in 03/2014 ART DUPLEX 1/1 " has an occlusion of the left subclavian, axillary, brachial, ulnar, and radial arteries, and there is no flow noted in the left palm. There was a study done 01/28/19 that  also showed acute occlusion of the left axillary and brachial arteries" Appreciate input  by VVS Dr. Louie Casa are unchanged from 2018 per vascular with chronic occlusion of the axillary and brachial arteries of the left side, is not a good candidate for any revascularization, again advised smoking cessation.  To consider left upper extremity arteriogram or further intervention if he develops progressive ischemic changes with ulceration and imminent limb loss in the left hand.  Discolored right Grt Toe-bilateral ABIs normal but right brachial index abnormal.  Will need follow-up with vascular.  Continue current anticoagulation.    3.9 cm ascending aortic aneurysm not abdominal aortic aneurysm.  He will need follow-up  Nutrition: per RN pt eating his 100% of meal- Dietition consulted to see if PEG feeding can be stopped Nutrition Problem: Severe Malnutrition Etiology: chronic illness(COPD) Signs/Symptoms: severe fat depletion, severe muscle depletion Interventions: Tube feeding  Pressure Ulcer: Pressure Injury 02/01/19 Sacrum Stage 2 -  Partial thickness loss of dermis presenting as a shallow open injury with a red, pink wound bed without slough. (Active)  02/01/19 1417  Location: Sacrum  Location Orientation:   Staging: Stage 2 -  Partial thickness loss of dermis presenting as a shallow open injury with a red, pink wound bed without slough.  Wound Description (Comments):   Present on Admission:     Body mass index is 22.4 kg/m.    DVT prophylaxis: eliquis Code Status: full Family Communication: plan of care discussed with patient at bedside.  Discussed with the nursing staff. Disposition Plan: Remains inpatient pending skilled nursing facility placement.  Patient is unsafe for discharge except to SNF. Currently no bed offers.  Case manager actively looking into it.   Consultants: PCCM,Neurology,Hematology, palliative care,General surgery, VVS Procedures: TPA, intubation, tracheostomy, PEG tube placement  ABI lower extremities Summary: Right: Resting right  ankle-brachial index is within normal range. No evidence of significant right lower extremity arterial disease. The right toe-brachial index is abnormal.  Left: Resting left ankle-brachial index is within normal range. No evidence of significant left lower extremity arterial disease.  Left upper extremities vascular studies Occlusion of the left subclavian, axillary, brachial, ulnar, and radial arteries, and there is no flow noted in the left palm. There was a study done 01/28/19 that also showed acute occlusion of the left axillary and brachial arteries"  Microbiology: Antimicrobials: Anti-infectives (From admission, onward)   Start     Dose/Rate Route Frequency Ordered Stop   02/16/19 1045  cefTRIAXone (ROCEPHIN) 1 g in sodium chloride 0.9 % 100 mL IVPB     1 g 200 mL/hr over 30 Minutes Intravenous Every 24 hours 02/16/19 1042 02/22/19 1232   02/12/19 1800  cefTRIAXone (ROCEPHIN) 1 g in sodium chloride 0.9 % 100 mL IVPB     1 g 200 mL/hr over 30 Minutes Intravenous  Once 02/12/19 1348 02/13/19 0412   02/10/19 1430  cefTRIAXone (ROCEPHIN) 1 g in sodium chloride 0.9 % 100 mL IVPB     1 g 200 mL/hr over 30 Minutes Intravenous Every 24 hours 02/10/19 1347 02/11/19 1754   01/26/19 1000  ceFEPIme (MAXIPIME) 2 g in sodium chloride 0.9 % 100 mL IVPB     2 g 200 mL/hr over 30 Minutes Intravenous Every 8 hours 01/26/19 0955 02/02/19 0145   01/25/19 1000  Ampicillin-Sulbactam (UNASYN) 3 g in sodium chloride 0.9 % 100 mL IVPB  Status:  Discontinued     3 g 200 mL/hr over 30 Minutes Intravenous Every 8 hours 01/25/19 0810 01/26/19  [id number]     Objective: Vitals:   02/26/19 2304 02/26/19 2306 02/27/19 0500 02/27/19 0727  BP: 119/86  (!) 127/101 98/69  Pulse:    100  Resp: 20  18 18   Temp:  98.2 F (36.8 C) 98 F (36.7 C) 97.8 F (36.6 C)  TempSrc:  Oral Oral   SpO2:   100% 100%  Weight:   67.8 kg   Height:        Intake/Output Summary (Last 24 hours) at 02/27/2019 0743 Last data filed at  02/27/2019 0500 Gross per 24 hour  Intake 120 ml  Output 1500 ml  Net -1380 ml   Filed Weights   02/23/19 0500 02/24/19 0500 02/27/19 0500  Weight: 66 kg 66 kg 67.8 kg   Weight change:   Body mass index is 22.4 kg/m.  Intake/Output from previous day: 01/18 0701 - 01/19 0700 In: 120 [P.O.:120] Out: 1500 [Urine:1500] Intake/Output this shift: No intake/output data recorded.  Examination:  General exam: Awake oriented to self, place.ON  Room air.   HEENT:Oral mucosa moist, Ear/Nose WNL grossly, dentition normal. Respiratory system: Bilaterally clear breath sounds, no use of accessory muscles. Cardiovascular system: S1 & S2 +, No JVD,. Gastrointestinal system: Abdomen soft, NT,ND, BS+ Nervous System:Alert, awake, speech is slurred/slowed, able to move his upper and lower extremities with grip on bilateral hand Extremities: No edema. Left arm pulse difficulty to feel, diminished capillary filling on the left palm chronic.. Warm bilateral lower extremities.  Right great toe blackish tip and adjacent toe w old wound dry Skin: No rashes,no icterus. MSK: Normal muscle bulk,tone, power  Medications:  Scheduled Meds: . amiodarone  200 mg Per Tube Daily  . apixaban  5 mg Per Tube BID  . atorvastatin  80 mg Oral q1800  . diltiazem  30 mg Oral Q6H  . divalproex  500 mg Oral Q12H  . feeding supplement (OSMOLITE 1.5 CAL)  474 mL Per Tube TID PC  . feeding supplement (PRO-STAT SUGAR FREE 64)  30 mL Per Tube BID  . folic acid  2 mg Oral Daily  . mouth rinse  15 mL Mouth Rinse BID  . metoprolol tartrate  100 mg Per Tube BID  . QUEtiapine  150 mg Oral QHS  . QUEtiapine  50 mg Per Tube Daily  . sennosides  5 mL Per Tube BID  . vitamin B-12  1,000 mcg Per Tube Daily   Continuous Infusions: . sodium chloride 10 mL/hr at 02/01/19 0541    Data Reviewed: I have personally reviewed following labs and imaging studies  CBC: No results for input(s): WBC, NEUTROABS, HGB, HCT, MCV, PLT in  the last 168 hours. Basic Metabolic Panel: Recent Labs  Lab 02/27/19 0458  NA 142  K 4.3  CL 106  CO2 27  GLUCOSE 82  BUN 21  CREATININE 0.80  CALCIUM 9.2   GFR: Estimated Creatinine Clearance: 83.6 mL/min (by C-G formula based on SCr of 0.8 mg/dL). Liver Function Tests: No results for input(s): AST, ALT, ALKPHOS, BILITOT, PROT, ALBUMIN in the last 168 hours. No results for input(s): LIPASE, AMYLASE in the last 168 hours. No results for input(s): AMMONIA in the last 168 hours. Coagulation Profile: No results for input(s): INR, PROTIME in the last 168 hours. Cardiac Enzymes: No results for input(s): CKTOTAL, CKMB, CKMBINDEX, TROPONINI in the last 168 hours. BNP (last 3 results) No results for input(s): PROBNP in the last 8760 hours. HbA1C: No results for input(s): HGBA1C in the last 72 hours.  CBG: No results for input(s): GLUCAP in the last 168 hours. Lipid Profile: No results for input(s): CHOL, HDL, LDLCALC, TRIG, CHOLHDL, LDLDIRECT in the last 72 hours. Thyroid Function Tests: No results for input(s): TSH, T4TOTAL, FREET4, T3FREE, THYROIDAB in the last 72 hours. Anemia Panel: No results for input(s): VITAMINB12, FOLATE, FERRITIN, TIBC, IRON, RETICCTPCT in the last 72 hours. Sepsis Labs: No results for input(s): PROCALCITON, LATICACIDVEN in the last 168 hours.  Recent Results (from the past 240 hour(s))  MRSA PCR Screening     Status: None   Collection Time: 02/26/19  6:58 AM   Specimen: Nasal Mucosa; Nasopharyngeal  Result Value Ref Range Status   MRSA by PCR NEGATIVE NEGATIVE Final    Comment:        The GeneXpert MRSA Assay (FDA approved for NASAL specimens only), is one component of a comprehensive MRSA colonization surveillance program. It is not intended to diagnose MRSA infection nor to guide or monitor treatment for MRSA infections. Performed at [facility], [place] [address] [place], [place] [address]       Radiology Studies: VAS [place] ABI  WITH/WO TBI  Result Date: 02/26/2019 LOWER EXTREMITY DOPPLER STUDY Indications: Peripheral artery disease, and black right great toe. High Risk Factors: Hypertension, hyperlipidemia, current smoker. Other Factors: AAA.  Vascular Interventions: Left arm bypass graft. Comparison       Left upper extremity arterial duplex done today, total Study:           occlusion. Performing Technologist: Baldwin Crown RVT, RDMS  Examination Guidelines: A complete evaluation includes at minimum, Doppler waveform signals and systolic blood pressure reading at the level of bilateral brachial, anterior tibial, and posterior tibial arteries, when vessel segments are accessible. Bilateral testing is considered an integral part of a complete examination. Photoelectric Plethysmograph (PPG) waveforms and toe systolic pressure readings are included as required and additional duplex testing as needed. Limited examinations for reoccurring indications may be performed as noted.  ABI Findings: +---------+------------------+-----+---------+--------+ Right    Rt Pressure (mmHg)IndexWaveform Comment  +---------+------------------+-----+---------+--------+ Brachial 138                    triphasic         +---------+------------------+-----+---------+--------+ PTA      157               1.14 triphasic         +---------+------------------+-----+---------+--------+ DP       159               1.15 biphasic          +---------+------------------+-----+---------+--------+ Great Toe0                 0.00 Absent            +---------+------------------+-----+---------+--------+ +---------+------------------+-----+--------+----------------------------------+ Left     Lt Pressure (mmHg)IndexWaveformComment                            +---------+------------------+-----+--------+----------------------------------+ Brachial                                Occlusion seen on prior study done                                          today.                             +---------+------------------+-----+--------+----------------------------------+  PTA      152               1.10 biphasic                                   +---------+------------------+-----+--------+----------------------------------+ DP       166               1.20 biphasic                                   +---------+------------------+-----+--------+----------------------------------+ Great Toe119               0.86 Abnormal                                   +---------+------------------+-----+--------+----------------------------------+ +-------+-----------+-----------+------------+------------+ ABI/TBIToday's ABIToday's TBIPrevious ABIPrevious TBI +-------+-----------+-----------+------------+------------+ Right  1.15                                           +-------+-----------+-----------+------------+------------+ Left   1.20                                           +-------+-----------+-----------+------------+------------+  Summary: Right: Resting right ankle-brachial index is within normal range. No evidence of significant right lower extremity arterial disease. The right toe-brachial index is abnormal. Left: Resting left ankle-brachial index is within normal range. No evidence of significant left lower extremity arterial disease.  *See table(s) above for measurements and observations.     Preliminary       LOS: 40 days   Time spent: More than 50% of that time was spent in counseling and/or coordination of care.  Antonieta Pert, MD [facility]  02/27/2019, 7:43 AM

## 2019-02-27 NOTE — Progress Notes (Addendum)
  Speech Language Pathology Treatment: Dysphagia;Cognitive-Linquistic  Patient Details Name: [name] MRN: [id number] DOB: 21-Mar-1950 Today's Date: 02/27/2019 Time: 1540-1600 SLP Time Calculation (min) (ACUTE ONLY): 20 min  Assessment / Plan / Recommendation Clinical Impression  Pt seen at bedside for skilled speech therapy intervention targeting goals for tolerance of po diet, communication of wants and needs, and following directions. Pt continues to make good progress re: communication and swallowing toward goals.   DYSPHAGIA: Pt was observed with puree consistency, which he tolerated well. No anterior leakage, oral residue, or overt s/s aspiration. RN reports pt consumes 100% at meals.  Will continue to follow to assess readiness to advance textures.   APHASIA: Pt continues to exhibit neologistic paraphasias, but is able to answer simple yes/no questions with 75% accuracy, and repeat functional words (brother, pudding) and phrases (thank you, good bye) with 70% accuracy. Pt follows simple 1-step directions with 80% accuracy, increasing with multimodal cues. Continued ST intervention is recommended acutely. Pt will benefit from continued skilled therapy after acute stay.     HPI   HPI: Pt is a 69 y/o M admitted with L MCA CVA s/p TPA. He was intubated 12/11-12/13, reintubated 12/13; trach 12/21. PMH: tobacco use, previous strokes, GERD, COPD, AAA, PVD, HTN, HLD, pulmonary nodule, depression, afib (recently missed doses of coumadin)       SLP Plan  Continue with current plan of care.        Recommendations  Diet recommendations: Dysphagia 1 (puree);Honey-thick liquid Liquids provided via: Cup Medication Administration: Crushed with puree Supervision: Trained caregiver to feed patient Compensations: Minimize environmental distractions;Slow rate;Small sips/bites;Lingual sweep for clearance of pocketing Postural Changes and/or Swallow Maneuvers: Seated upright 90 degrees               Oral Care Recommendations: Oral care QID Follow up Recommendations: Skilled Nursing facility;24 hour supervision/assistance SLP Visit Diagnosis: Dysphagia, oropharyngeal phase (R13.12);Aphasia (R47.01) Plan: Continue with current plan of care       GO               Veronia Laprise B. Quentin Ore, [facility], [place] Speech Language Pathologist Office: [phone] Pager: [phone]   Shonna Chock 02/27/2019, 4:01 PM

## 2019-02-27 NOTE — Progress Notes (Signed)
Nutrition Follow-up  DOCUMENTATION CODES:   Severe malnutrition in context of chronic illness  INTERVENTION:  Discontinue tube feeding.   Provide Magic cup TID with meals, each supplement provides 290 kcal and 9 grams of protein  Continue 30 ml Prostat BID via PEG for wound healing, each supplement provides 100 kcal and 15 grams of protein.   Encourage adequate PO intake.   NUTRITION DIAGNOSIS:   Severe Malnutrition related to chronic illness(COPD) as evidenced by severe fat depletion, severe muscle depletion; ongoing  GOAL:   Patient will meet greater than or equal to 90% of their needs; met  MONITOR:   PO intake, Supplement acceptance, Diet advancement, Skin, Weight trends, Labs, I & O's  REASON FOR ASSESSMENT:   Consult, Ventilator Enteral/tube feeding initiation and management  ASSESSMENT:   68 yo male admitted with hypertensive emergency in setting of acute L MCA stroke, required intubation for airway protection, pt choking on own secretions. PMH includes COPD, AF, CVA, PAD.  12/21 S/P tracheostomy 12/22 PEG placed 1/4 Trach pulled out and left out 1/9 PEG pulled and was replaced by IR  1/13 Diet advanced to dysphagia 1 diet with honey thick liquids.  Meal completion has been 100%. Intake has been adequate and pt has been tolerating his po. Bolus feeds orders put in place if po intake <50% to ensure adequate nutrition is met, however pt has not needed to be supplemented with bolus tube feedings as pt has been able to complete his meals. RD to discontinue tube feeding orders. Will continue Prostat via PEG to aid in wound healing.   Labs and medications reviewed.   Diet Order:   Diet Order            DIET - DYS 1 Room service appropriate? No; Fluid consistency: Honey Thick  Diet effective now              EDUCATION NEEDS:   Not appropriate for education at this time  Skin:  Skin Assessment: Skin Integrity Issues: Skin Integrity Issues:: Stage  II Stage II: sacrum  Last BM:  1/18  Height:   Ht Readings from Last 1 Encounters:  01/28/19 5' 8.5" (1.74 m)    Weight:   Wt Readings from Last 1 Encounters:  02/27/19 67.8 kg    Ideal Body Weight:  70 kg  BMI:  Body mass index is 22.4 kg/m.  Estimated Nutritional Needs:   Kcal:  1165-7903  Protein:  100-120 grams  Fluid:  >2L    Corrin Parker, MS, RD, LDN Pager # [phone] After hours/ weekend pager # [phone]

## 2019-02-27 NOTE — Plan of Care (Signed)

## 2019-02-27 NOTE — TOC Progression Note (Signed)
Transition of Care [facility]) - Progression Note    Patient Details  Name: [name] MRN: [id number] Date of Birth: [birth year]  [facility] CM/SW [place], [place] Phone Number: 02/27/2019, 3:54 PM  Clinical Narrative:    CSW has faxed out pt further as currently no SNF options. TOC team continues to follow.    Expected Discharge Plan: [place] Barriers to Discharge: SNF Pending bed offer, Continued Medical Work up  Expected Discharge Plan and Services Expected Discharge Plan: [place] In-house Referral: Clinical Social Work Living arrangements for the past 2 months: Single Family Home  Readmission Risk Interventions No flowsheet data found.

## 2019-02-28 LAB — BASIC METABOLIC PANEL
Anion gap: 9 (ref 5–15)
BUN: 17 mg/dL (ref 8–23)
CO2: 27 mmol/L (ref 22–32)
Calcium: 9 mg/dL (ref 8.9–10.3)
Chloride: 103 mmol/L (ref 98–111)
Creatinine, Ser: 0.85 mg/dL (ref 0.61–1.24)
GFR calc Af Amer: >60 mL/min (ref >60)
GFR calc non Af Amer: >60 mL/min (ref >60)
Glucose, Bld: 88 mg/dL (ref 70–99)
Potassium: 4 mmol/L (ref 3.5–5.1)
Sodium: 139 mmol/L (ref 135–145)

## 2019-02-28 LAB — CBC
HCT: 42.5 % (ref 39.0–52.0)
Hemoglobin: 13.8 g/dL (ref 13.0–17.0)
MCH: 33.7 pg (ref 26.0–34.0)
MCHC: 32.5 g/dL (ref 30.0–36.0)
MCV: 103.7 fL — ABNORMAL HIGH (ref 80.0–100.0)
Platelets: 308 10*3/uL (ref 150–400)
RBC: 4.1 MIL/uL — ABNORMAL LOW (ref 4.22–5.81)
RDW: 14.4 % (ref 11.5–15.5)
WBC: 10.2 10*3/uL (ref 4.0–10.5)
nRBC: 0 % (ref 0.0–0.2)

## 2019-02-28 NOTE — Progress Notes (Signed)
PROGRESS NOTE    [name]  [id number] DOB: Nov 08, 1950 DOA: 01/18/2019 PCP: Shon Baton, MD   Brief Narrative: 69 year old man with A. fib on Coumadin-who had recently missed doses hypertension, diabetes, previous history of, COPD, AAA, hypertension, hyperlipidemia, pulmonary nodule, depression brought to the ER with change in mental status, aphasia, leaning to the right found to be nonverbal and found to have acute left MCA CVA.  Patient was admitted received thrombolytics in the ED and required intubation for airway protection.  Patient completed stroke work-up.  He was treated for ventilation pneumonia/sputum grew streptococcal pneumonia and Enterobacter cloacae and was treated with antibiotics.  He had difficulty weaning from vent and subsequently needed tracheostomy and PEG tube placement.  Patient also had A. fib with RVR, B12 deficiency, iron deficiency. 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 Patient was subsequently switched to Eliquis.  Significant events: 12/10 tPA administered 12/11 intubated 12/21 Tracheostomy 12/22 PEG placed 01/04 patient pulled out trach >> left out 1/6-transferred to [facility] service 1/18-occlusion of left upper extremity- multiple vessels noted on the duplex vascular surgery was consulted-felt to be old.  Subjective: Resting comfortably.  Feeling cold covered in blanket. Difficult to comprehend, alert awake, dysarthric speech. Overnight  no fever T-max 98.4, blood pressure stable doing well on room air. As patient is eating well tube feeding discontinued 1/19 per dietiatian Has baseline speech dysarthria, appears poor historian.  Assessment & Plan:   Acute stroke with left embolic stroke with history of CVA: Left MCA infarct status post TPA 12/10. completed stroke work-up as above.  Continue eliquis 5 mg twice daily, Lipitor 80 mg  Atrial fibrillation-likey chronic, rate controlled on amiodarone 200 mg daily, Cardizem 30 mg every 6 hour-switch to 120 mg CD. Cont  Metoprolol 100 mg bid,Eliquis.   B12 deficiency/iron deficiency:B12 level improved to 644 from 161.  Continue folic acid.    Enterobacter [id number] antibiotics 1/14  Delirium/acute metabolic encephalopathy/chronic benzo use: Appears more or less stable, alert awake able to tell me his name and date of birth.  Asking for blanket.  Continue Seroquel 50 daily and 150 bedtime.  GERD.  Acute hypoxic respiratory failure: Resolved.  Initially intubated, currently on room air.  Underwent tracheostomy 12/21 and he pulled out 1/4.  [name] site has healed.  Ventilator associated pneumonia sputum with history of pneumonia and Enterobacter Scalisi and was treated with antibiotic  Dysphagia with severe protein calorie malnutrition: On PEG tube.  Speech continueS to advise dysphagia 1 diet with honey thick liquid. Feeding changed to PROSTAT 30 mils twice daily  Via PEG- for wound healing, Magic cup 3 times daily and tube feeding discontinued 1/19  Peripheral vascular disease/left upper extremity ischemia: has history of left axillary artery to brachial artery bypass in 03/2014 ART DUPLEX 1/1 " has an occlusion of the left subclavian, axillary, brachial, ulnar, and radial arteries, and there is no flow noted in the left palm. There was a study done 01/28/19 that also showed acute occlusion of the left axillary and  brachial arteries" Appreciate input by VVS Dr. Louie Casa are  unchanged from 2018 per vascular with chronic occlusion of the axillary and brachial arteries of the left side, is not a good candidate for any revascularization, again advised smoking cessation.  To consider left upper extremity arteriogram or further intervention if he develops progressive ischemic changes with ulceration and imminent limb loss in the left hand.  Discolored right Grt Toe-bilateral ABIs normal but right brachial index abnormal.  Will need follow-up with vascular.  Continue current anticoagulation.    3.9 cm ascending aortic aneurysm not abdominal aortic aneurysm.  He will need follow-up  Nutrition: per RN pt eating his 100% of meal- Dietition consulted to see if PEG feeding can be stopped Nutrition Problem: Severe Malnutrition Etiology: chronic illness(COPD) Signs/Symptoms: severe fat depletion, severe muscle depletion Interventions: Prostat, Magic cup  Pressure Ulcer: Pressure Injury 02/01/19 Sacrum Stage 2 -  Partial thickness loss of dermis presenting as a shallow open injury with a red, pink wound bed without slough. (Active)  02/01/19 1417  Location: Sacrum  Location Orientation:   Staging: Stage 2 -  Partial thickness loss of dermis presenting as a shallow open injury with a red, pink wound bed without slough.  Wound Description (Comments):   Present on Admission:     Body mass index is 22.43 kg/m.    DVT prophylaxis: eliquis Code Status: full Family Communication: plan of care discussed with patient at bedside.  Discussed with the nursing staff. Disposition Plan: Remains inpatient pending skilled nursing facility placement. Currently no SNF offers, SW following  Consultants: PCCM,Neurology,Hematology, palliative care,General surgery, VVS Procedures: TPA, intubation, tracheostomy, PEG tube placement  ABI lower extremities Summary: Right: Resting right ankle-brachial index is within normal range. No evidence of significant right lower extremity arterial  disease. The right toe-brachial index is abnormal.  Left: Resting left ankle-brachial index is within normal range. No evidence of significant left lower extremity arterial disease.  Left upper extremities vascular studies Occlusion of the left subclavian, axillary, brachial, ulnar, and radial arteries, and there is no flow noted in the left palm. There was a study done 01/28/19 that also showed acute occlusion of the left axillary and brachial arteries"  Microbiology: Antimicrobials: Anti-infectives (From admission, onward)   Start     Dose/Rate Route Frequency Ordered Stop   02/16/19 1045  cefTRIAXone (ROCEPHIN) 1 g in sodium chloride 0.9 % 100 mL IVPB     1 g 200 mL/hr over 30 Minutes Intravenous Every 24 hours 02/16/19 1042 02/22/19 1232   02/12/19 1800  cefTRIAXone (ROCEPHIN) 1 g in sodium chloride 0.9 % 100 mL IVPB     1 g 200 mL/hr over 30 Minutes Intravenous  Once 02/12/19 1348 02/13/19 0412   02/10/19 1430  cefTRIAXone (ROCEPHIN) 1 g in sodium chloride 0.9 % 100 mL IVPB     1 g 200 mL/hr over 30 Minutes Intravenous Every 24 hours 02/10/19 1347 02/11/19 1754   01/26/19 1000  ceFEPIme (MAXIPIME) 2 g in sodium chloride 0.9 % 100 mL IVPB     2 g 200 mL/hr over 30 Minutes Intravenous Every 8 hours 01/26/19 0955 02/02/19 0145   01/25/19 1000  Ampicillin-Sulbactam (UNASYN) 3 g in sodium chloride 0.9 % 100 mL IVPB  Status:  Discontinued     3 g 200 mL/hr over 30 Minutes Intravenous Every 8 hours 01/25/19 0810 01/26/19 0955     Objective: Vitals:   02/28/19 0007 02/28/19 0415 02/28/19 0420 02/28/19 0500  BP:  127/88  Pulse:  (!) 49    Resp:  12    Temp: 97.6 F (36.4 C)  98.2 F (36.8 C)   TempSrc: Oral     SpO2:  91%    Weight:    67.9 kg  Height:        Intake/Output Summary (Last 24 hours) at 02/28/2019 0749 Last data filed at 02/27/2019 1840 Gross per 24 hour  Intake 360 ml  Output 451 ml  Net -91 ml   Filed Weights   02/24/19 0500 02/27/19 0500 02/28/19 0500    Weight: 66 kg 67.8 kg 67.9 kg   Weight change: 0.1 kg  Body mass index is 22.43 kg/m.  Intake/Output from previous day: 01/19 0701 - 01/20 0700 In: 360 [P.O.:360] Out: 451 [Urine:450; Stool:1] Intake/Output this shift: No intake/output data recorded.  Examination:  General exam: Awake, speech difficulties difficult to comprehend, not in distress.  On room air.   HEENT:Oral mucosa moist, Ear/Nose WNL grossly, dentition normal. Respiratory system: Bilaterally clear breath sounds, no use of accessory muscles. Cardiovascular system: S1 & S2 +, No JVD,. Gastrointestinal system: Abdomen soft, NT,ND, BS+. PEG+ Nervous System:Alert, awake, speech is slurred/slowed, able to move his upper and lower extremities with grip on bilateral hand. Extremities: No edema.  Decreased capillary filling on the left palm, warm lower extremities..  30 blackish Skin: No rashes,no icterus. MSK: Normal muscle bulk,tone, power  Medications:  Scheduled Meds: . amiodarone  200 mg Per Tube Daily  . apixaban  5 mg Per Tube BID  . atorvastatin  80 mg Oral q1800  . diltiazem  30 mg Oral Q6H  . divalproex  500 mg Oral Q12H  . feeding supplement (PRO-STAT SUGAR FREE 64)  30 mL Per Tube BID  . folic acid  2 mg Oral Daily  . mouth rinse  15 mL Mouth Rinse BID  . metoprolol tartrate  100 mg Per Tube BID  . QUEtiapine  150 mg Oral QHS  . QUEtiapine  50 mg Per Tube Daily  . sennosides  5 mL Per Tube BID  . vitamin B-12  1,000 mcg Per Tube Daily   Continuous Infusions: . sodium chloride 10 mL/hr at 02/01/19 0541    Data Reviewed: I have personally reviewed following labs and imaging studies  CBC: Recent Labs  Lab 02/28/19 0439  WBC 10.2  HGB 13.8  HCT 42.5  MCV 103.7*  PLT [id number]   Basic Metabolic Panel: Recent Labs  Lab 02/27/19 0458 02/28/19 0439  NA 142 139  K 4.3 4.0  CL 106 103  CO2 27 27  GLUCOSE 82 88  BUN 21 17  CREATININE 0.80 0.85  CALCIUM 9.2 9.0   GFR: Estimated Creatinine  Clearance: 78.8 mL/min (by C-G formula based on SCr of 0.85 mg/dL). Liver Function Tests: No results for input(s): AST, ALT, ALKPHOS, BILITOT, PROT, ALBUMIN in the last 168 hours. No results for input(s): LIPASE, AMYLASE in the last 168 hours. No results for input(s): AMMONIA in the last 168 hours. Coagulation Profile: No results for input(s): INR, PROTIME in the last 168 hours. Cardiac Enzymes: No results for input(s): CKTOTAL, CKMB, CKMBINDEX, TROPONINI in the last 168 hours. BNP (last 3 results) No results for input(s): PROBNP in the last 8760 hours. HbA1C: No results for input(s): HGBA1C in the last 72 hours. CBG: No results for input(s): GLUCAP in the last 168 hours. Lipid Profile: No results for input(s): CHOL, HDL, LDLCALC, TRIG, CHOLHDL, LDLDIRECT in the last 72 hours. Thyroid Function Tests:  No results for input(s): TSH, T4TOTAL, FREET4, T3FREE, THYROIDAB in the last 72 hours. Anemia Panel: No results for input(s): VITAMINB12, FOLATE, FERRITIN, TIBC, IRON, RETICCTPCT in the last 72 hours. Sepsis Labs: No results for input(s): PROCALCITON, LATICACIDVEN in the last 168 hours.  Recent Results (from the past 240 hour(s))  MRSA PCR Screening     Status: None   Collection Time: 02/26/19  6:58 AM   Specimen: Nasal Mucosa; Nasopharyngeal  Result Value Ref Range Status   MRSA by PCR NEGATIVE NEGATIVE Final    Comment:        The GeneXpert MRSA Assay (FDA approved for NASAL specimens only), is one component of a comprehensive MRSA colonization surveillance program. It is not intended to diagnose MRSA infection nor to guide or monitor treatment for MRSA infections. Performed at [facility], [place] [address], [place], [place] [address]       Radiology Studies: VAS [place] ABI WITH/WO TBI  Result Date: 02/27/2019 LOWER EXTREMITY DOPPLER STUDY Indications: Peripheral artery disease, and black right great toe. High Risk Factors: Hypertension, hyperlipidemia, current smoker.  Other Factors: AAA.  Vascular Interventions: Left arm bypass graft. Comparison       Left upper extremity arterial duplex done today, total Study:           occlusion. Performing Technologist: Baldwin Crown RVT, RDMS  Examination Guidelines: A complete evaluation includes at minimum, Doppler waveform signals and systolic blood pressure reading at the level of bilateral brachial, anterior tibial, and posterior tibial arteries, when vessel segments are accessible. Bilateral testing is considered an integral part of a complete examination. Photoelectric Plethysmograph (PPG) waveforms and toe systolic pressure readings are included as required and additional duplex testing as needed. Limited examinations for reoccurring indications may be performed as noted.  ABI Findings: +---------+------------------+-----+---------+--------+ Right    Rt Pressure (mmHg)IndexWaveform Comment  +---------+------------------+-----+---------+--------+ Brachial 138                    triphasic         +---------+------------------+-----+---------+--------+ PTA      157               1.14 triphasic         +---------+------------------+-----+---------+--------+ DP       159               1.15 biphasic          +---------+------------------+-----+---------+--------+ Great Toe0                 0.00 Absent            +---------+------------------+-----+---------+--------+ +---------+------------------+-----+--------+----------------------------------+ Left     Lt Pressure (mmHg)IndexWaveformComment                            +---------+------------------+-----+--------+----------------------------------+ Brachial                                Occlusion seen on prior study done                                         today.                             +---------+------------------+-----+--------+----------------------------------+ PTA  152               1.10 biphasic                                    +---------+------------------+-----+--------+----------------------------------+ DP       166               1.20 biphasic                                   +---------+------------------+-----+--------+----------------------------------+ Great Toe119               0.86 Abnormal                                   +---------+------------------+-----+--------+----------------------------------+ +-------+-----------+-----------+------------+------------+ ABI/TBIToday's ABIToday's TBIPrevious ABIPrevious TBI +-------+-----------+-----------+------------+------------+ Right  1.15                                           +-------+-----------+-----------+------------+------------+ Left   1.20                                           +-------+-----------+-----------+------------+------------+  Summary: Right: Resting right ankle-brachial index is within normal range. No evidence of significant right lower extremity arterial disease. The right toe-brachial index is abnormal. Left: Resting left ankle-brachial index is within normal range. No evidence of significant left lower extremity arterial disease.  *See table(s) above for measurements and observations.  Electronically signed by Deitra Mayo MD on 02/27/2019 at 12:52:17 PM.    Final    VAS [place] UPPER EXTREMITY ARTERIAL DUPLEX  Result Date: 02/27/2019 UPPER EXTREMITY DUPLEX STUDY Indications: Patient complains of arm pain. History:     Patient has a history ofembolic stroke.  Risk Factors:  Hypertension, Diabetes. Other Factors: Atrial fibrilltion, on Coumadin. Limitations: contstant movement of arm as well as sitting up and laying back              down during entire study. Comparison Study: Prior upper extremity arterial duplex done12/20/20 Performing Technologist: Sharion Dove RVS  Examination Guidelines: A complete evaluation includes B-mode imaging, spectral Doppler, color Doppler, and power Doppler as needed of  all accessible portions of each vessel. Bilateral testing is considered an integral part of a complete examination. Limited examinations for reoccurring indications may be performed as noted.  Left Doppler Findings: +---------------+----------+--------+--------+------------------------------+ Site           PSV (cm/s)WaveformStenosisComments                       +---------------+----------+--------+--------+------------------------------+ Subclavian Dist                  occluded                               +---------------+----------+--------+--------+------------------------------+ Axillary                         occluded                               +---------------+----------+--------+--------+------------------------------+  Brachial Prox                    occluded                               +---------------+----------+--------+--------+------------------------------+ Brachial Mid                     occluded                               +---------------+----------+--------+--------+------------------------------+ Brachial Dist                    occluded                               +---------------+----------+--------+--------+------------------------------+ Radial Prox                      occluded                               +---------------+----------+--------+--------+------------------------------+ Radial Mid                       occluded                               +---------------+----------+--------+--------+------------------------------+ Radial Dist                      occluded                               +---------------+----------+--------+--------+------------------------------+ Ulnar Prox                       occluded                               +---------------+----------+--------+--------+------------------------------+ Ulnar Mid                        occluded                                +---------------+----------+--------+--------+------------------------------+ Ulnar Dist                       occluded                               +---------------+----------+--------+--------+------------------------------+ Palmar Arch                              No arterial flow noted in palm +---------------+----------+--------+--------+------------------------------+   Summary:  Left: Total occlusion visualized in the left upper extremity.       Obstruction noted in the subclavian artery, axillary artery,       brachial artery, radial artery, ulnar artery and palmar arch.       The left axillary and brachial arteries remain occluded since       prior study done  01/28/19. The subclavian, ulnar, and radial arteries now       appear occluded with no arterial flow noted in the left palm. *See table(s) above for measurements and observations. Electronically signed by Deitra Mayo MD on 02/27/2019 at 12:54:49 PM.    Final       LOS: 41 days   Time spent: More than 50% of that time was spent in counseling and/or coordination of care.  Antonieta Pert, MD [facility]  02/28/2019, 7:49 AM

## 2019-02-28 NOTE — Plan of Care (Signed)

## 2019-02-28 NOTE — Progress Notes (Signed)
Occupational Therapy Treatment Patient Details Name: [name] MRN: [id number] DOB: 1950/07/15 Today's Date: 02/28/2019    History of present illness 55yM admitted 01/20/19 with hypertensive emergency in setting of acute L MCA stroke s/p tPA, AF/RVR.  Has been noncompliant with his warfarin for A. Fib. Intubated for respiratory failure. Trach 01/29/19 with PEG planned for 01/30/19. No sedation at time of PT eval.    OT comments  Pt progressing to OOB ADL and mobility in room with RW. Pt requiring assist for combing hair and often avoiding R side of head. Pt performing hand washing and face washing with moderate cues to continue to get soap off of face. Pt modA to [place]; limited by poor attention, especially to R side, decreased ability to care for self and decreased awareness of deficits. Pt requiring assist to avoid obstacles on R side. Pt becomes frustrated with cognitive communication deficit and often cuss words audible, but today, pt said "drink." Pt would benefit from continued OT skilled services. OT following acutely.    Follow Up Recommendations  SNF;Supervision/Assistance - 24 hour    Equipment Recommendations  Other (comment)(to be determined)    Recommendations for Other Services      Precautions / Restrictions Precautions Precautions: Fall Precaution Comments: multiple lines Restrictions Weight Bearing Restrictions: No       Mobility Bed Mobility Overal bed mobility: Needs Assistance       Supine to sit: Supervision        Transfers Overall transfer level: Needs assistance Equipment used: Rolling walker (2 wheeled) Transfers: Sit to/from Stand Sit to Stand: Min assist         General transfer comment: cues for hand placement and stability assist    Balance Overall balance assessment: Needs assistance Sitting-balance support: Feet supported Sitting balance-Leahy Scale: Fair       Standing balance-Leahy Scale: Fair Standing balance  comment: Requires cues to avoid objects on R side. Pt stood at sink x3 mins for grooming and requiring rest break after.                           ADL either performed or assessed with clinical judgement   ADL Overall ADL's : Needs assistance/impaired     Grooming: Min guard;Minimal assistance;Standing Grooming Details (indicate cue type and reason): Pt requiring assist for combing hair and often avoiding R side of head. Pt performing hand washing and face washing with moderate cues to continue to get soap off of face.             Lower Body Dressing: Maximal assistance;Sitting/lateral leans Lower Body Dressing Details (indicate cue type and reason): Pt pulling socks on after OTR applied over heel.  Pt kept pulling the R sock to tighten Toilet Transfer: Minimal assistance;Ambulation;RW;BSC           Functional mobility during ADLs: Minimal assistance;Rolling walker;Cueing for safety General ADL Comments: Pt modA to [place]; limited by poor attention, especially to R side, decreased ability to care for self and decreased awareness of deficits.     Vision       Perception     Praxis      Cognition Arousal/Alertness: Awake/alert Behavior During Therapy: Flat affect Overall Cognitive Status: Difficult to assess                         Following Commands: Follows one step commands inconsistently;Follows one step commands with increased time;Follows  multi-step commands inconsistently;Follows multi-step commands with increased time Safety/Judgement: Decreased awareness of safety;Decreased awareness of deficits Awareness: Intellectual Problem Solving: Slow processing;Decreased initiation;Difficulty sequencing;Requires tactile cues;Requires verbal cues General Comments: Most common words that are intelligible are cuss words. Pt able to eventually say "drink" after repetition. Thickened liquid provided for pt. Pt requiring cues to manage RW on R side.         Exercises     Shoulder Instructions       General Comments VSS. Pt drinking thickened liquid after pt asking for  "drink."    Pertinent Vitals/ Pain       Pain Assessment: No/denies pain  Home Living                                          Prior Functioning/Environment              Frequency  Min 1X/week        Progress Toward Goals  OT Goals(current goals can now be found in the care plan section)  Progress towards OT goals: Progressing toward goals  Acute Rehab OT Goals Patient Stated Goal: none stated OT Goal Formulation: Patient unable to participate in goal setting Time For Goal Achievement: 03/07/19 Potential to Achieve Goals: Fair ADL Goals Pt Will Perform Eating: with set-up;with adaptive utensils;sitting Pt Will Perform Grooming: with min assist;sitting Pt Will Perform Upper Body Dressing: with min assist;sitting Pt Will Transfer to Toilet: with min guard assist;ambulating;regular height toilet Pt/caregiver will Perform Home Exercise Program: Right Upper extremity;Increased strength;Increased ROM;With minimal assist  Plan Discharge plan remains appropriate;Frequency remains appropriate    Co-evaluation                 AM-PAC OT "6 Clicks" Daily Activity     Outcome Measure   Help from another person eating meals?: Total Help from another person taking care of personal grooming?: A Little Help from another person toileting, which includes using toliet, bedpan, or urinal?: Total Help from another person bathing (including washing, rinsing, drying)?: A Lot Help from another person to put on and taking off regular upper body clothing?: A Lot Help from another person to put on and taking off regular lower body clothing?: A Lot 6 Click Score: 11    End of Session Equipment Utilized During Treatment: Gait belt;Rolling walker  OT Visit Diagnosis: Unsteadiness on feet (R26.81);Muscle weakness (generalized) (M62.81);Cognitive  communication deficit (R41.841) Symptoms and signs involving cognitive functions: Cerebral infarction   Activity Tolerance Patient tolerated treatment well   Patient Left in bed;with call bell/phone within reach;with bed alarm set;with nursing/sitter in room   Nurse Communication Mobility status        Time: [id number] OT Time Calculation (min): 40 min  Charges: OT General Charges $OT Visit: 1 Visit OT Treatments $Self Care/Home Management : 8-22 mins $Therapeutic Activity: 8-22 mins $Neuromuscular Re-education: 8-22 mins  Jefferey Pica OTR/L Acute Rehabilitation Services Pager: [phone] Office: [phone]    Daijanae Rafalski C 02/28/2019, 1:05 PM

## 2019-03-01 LAB — BASIC METABOLIC PANEL
Anion gap: 9 (ref 5–15)
BUN: 17 mg/dL (ref 8–23)
CO2: 30 mmol/L (ref 22–32)
Calcium: 9.2 mg/dL (ref 8.9–10.3)
Chloride: 102 mmol/L (ref 98–111)
Creatinine, Ser: 0.82 mg/dL (ref 0.61–1.24)
GFR calc Af Amer: >60 mL/min (ref >60)
GFR calc non Af Amer: >60 mL/min (ref >60)
Glucose, Bld: 80 mg/dL (ref 70–99)
Potassium: 4.1 mmol/L (ref 3.5–5.1)
Sodium: 141 mmol/L (ref 135–145)

## 2019-03-01 MED ORDER — DILTIAZEM HCL ER COATED BEADS 120 MG PO CP24
120.0000 mg | ORAL_CAPSULE | Freq: Every day | ORAL | Status: DC
  Administered 2019-03-01 – 2019-03-03 (×3): 120 mg via ORAL
  Filled 2019-03-01 (×3): qty 1

## 2019-03-01 MED ORDER — ALUM & MAG HYDROXIDE-SIMETH 200-200-20 MG/5ML PO SUSP
30.0000 mL | Freq: Four times a day (QID) | ORAL | Status: DC | PRN
  Administered 2019-03-01: 30 mL via ORAL
  Filled 2019-03-01: qty 30

## 2019-03-01 NOTE — Progress Notes (Signed)
PROGRESS NOTE    [name]  [id number] DOB: [birth year] DOA: 01/18/2019 PCP: Shon Baton, MD   Brief Narrative: 69 year old man with A. fib on Coumadin-who had recently missed doses hypertension, diabetes, previous history of, COPD, AAA, hypertension, hyperlipidemia, pulmonary nodule, depression brought to the ER with change in mental status, aphasia, leaning to the right found to be nonverbal and found to have acute left MCA CVA.  Patient was admitted received thrombolytics in the ED and required intubation for airway protection.  Patient completed stroke work-up.  He was treated for ventilation pneumonia/sputum grew streptococcal pneumonia and Enterobacter cloacae and was treated with antibiotics.  He had difficulty weaning from vent and subsequently needed tracheostomy and PEG tube placement.  Patient also had A. fib with RVR, B12 deficiency, iron deficiency. 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 Patient was subsequently switched to Eliquis.  Significant events: 12/10 tPA administered 12/11 intubated 12/21 Tracheostomy 12/22 PEG placed 01/04 patient pulled out trach >> left out 1/6-transferred to [facility] service 1/18-occlusion of left upper extremity- multiple vessels noted on the duplex vascular surgery was consulted-felt to be old.  Subjective:  Alert awake, working with physical therapy this morning. No acute events overnight.  Remains afebrile. VSS Has baseline  speech dysarthria, appears poor historian.  Assessment & Plan:   Acute stroke with left embolic stroke with history of CVA: Left MCA infarct status post TPA 12/10. completed stroke work-up as above.  Continue eliquis 5 mg twice daily, Lipitor 80 mg.  Atrial fibrillation-likey chronic, rate controlled on amiodarone 200 mg daily, Cardizem 30 mg every 6 hour-switch to 120 mg CD. Cont  Metoprolol 100 mg bid,Eliquis.   B12 deficiency/iron deficiency:B12 level improved to 644 from 161.  Continue folic acid.    Enterobacter [id number] antibiotics 1/14  Delirium/acute metabolic encephalopathy/chronic benzo use: Appears more or less stable, alert awake able to tell me his name and date of birth.  Asking for blanket.  Continue Seroquel 50 daily and 150 bedtime.  GERD.  Acute hypoxic respiratory failure: Resolved.  Initially intubated, currently on room air.  Underwent tracheostomy 12/21 and he pulled out 1/4.  [name] site has healed.  Ventilator associated pneumonia sputum with history of pneumonia and Enterobacter Scalisi and was treated with antibiotic  Dysphagia with severe protein calorie malnutrition: Patient is able to eat well, on dysphagia 1 diet with honey thick liquid, speech following for possible further swallow eval. Feeding changed to PROSTAT 30 mils twice daily  Via PEG- for wound healing, Magic cup 3 times daily and tube feeding discontinued 1/19.  Peripheral vascular disease/left upper extremity ischemia: has history of left axillary artery to brachial artery bypass in 03/2014 ART DUPLEX 1/1 " has an occlusion of the left subclavian, axillary, brachial, ulnar, and radial arteries, and there is no flow noted in the left palm. There was a study done 01/28/19 that also showed acute occlusion of the left axillary and brachial arteries" Appreciate input by VVS Dr. Louie Casa are unchanged from 2018 per vascular with chronic  occlusion of the axillary and brachial arteries of the left  side, is not a good candidate for any revascularization, again advised smoking cessation.  To consider left upper extremity arteriogram or further intervention if he develops progressive ischemic changes with ulceration and imminent limb loss in the left hand.  Discolored right Grt Toe-bilateral ABIs normal but right brachial index abnormal.  Will need follow-up with vascular.  Continue current anticoagulation.    3.9 cm ascending aortic aneurysm not abdominal aortic aneurysm.  He will need follow-up  Nutrition: per RN pt eating his 100% of meal- Dietition consulted to see if PEG feeding can be stopped Nutrition Problem: Severe Malnutrition Etiology: chronic illness(COPD) Signs/Symptoms: severe fat depletion, severe muscle depletion Interventions: Prostat, Magic cup  Pressure Ulcer: Pressure Injury 02/01/19 Sacrum Stage 2 -  Partial thickness loss of dermis presenting as a shallow open injury with a red, pink wound bed without slough. (Active)  02/01/19 1417  Location: Sacrum  Location Orientation:   Staging: Stage 2 -  Partial thickness loss of dermis presenting as a shallow open injury with a red, pink wound bed without slough.  Wound Description (Comments):   Present on Admission:     Body mass index is 22.43 kg/m.    DVT prophylaxis: eliquis Code Status: full Family Communication: plan of care discussed with patient at bedside.  Discussed with the nursing staff.  No family at bedside. Disposition Plan: Remains inpatient pending skilled nursing facility placement.Currently no SNF offers, SW following  Consultants: PCCM,Neurology,Hematology, palliative care,General surgery, VVS Procedures: TPA, intubation, tracheostomy, PEG tube placement  ABI lower extremities Right: Resting right ankle-brachial index is within normal range. No evidence of significant right lower extremity arterial disease. The right toe-brachial index is abnormal. Left: Resting left ankle-brachial index is  within normal range. No evidence of significant left lower extremity arterial disease.  Left upper extremities vascular studies Occlusion of the left subclavian, axillary, brachial, ulnar, and radial arteries, and there is no flow noted in the left palm. There was a study done 01/28/19 that also showed acute occlusion of the left axillary and brachial arteries"  Microbiology: Antimicrobials: Anti-infectives (From admission, onward)   Start     Dose/Rate Route Frequency Ordered Stop   02/16/19 1045  cefTRIAXone (ROCEPHIN) 1 g in sodium chloride 0.9 % 100 mL IVPB     1 g 200 mL/hr over 30 Minutes Intravenous Every 24 hours 02/16/19 1042 02/22/19 1232   02/12/19 1800  cefTRIAXone (ROCEPHIN) 1 g in sodium chloride 0.9 % 100 mL IVPB     1 g 200 mL/hr over 30 Minutes Intravenous  Once 02/12/19 1348 02/13/19 0412   02/10/19 1430  cefTRIAXone (ROCEPHIN) 1 g in sodium chloride 0.9 % 100 mL IVPB     1 g 200 mL/hr over 30 Minutes Intravenous Every 24 hours 02/10/19 1347 02/11/19 1754   01/26/19 1000  ceFEPIme (MAXIPIME) 2 g in sodium chloride 0.9 % 100 mL IVPB     2 g 200 mL/hr over 30 Minutes Intravenous Every 8 hours 01/26/19 0955 02/02/19 0145   01/25/19 1000  Ampicillin-Sulbactam (UNASYN) 3 g in sodium chloride 0.9 % 100 mL IVPB  Status:  Discontinued     3 g 200 mL/hr over 30 Minutes Intravenous Every 8 hours 01/25/19 0810 01/26/19 0955     Objective: Vitals:   03/01/19 0007 03/01/19 0030 03/01/19 0415 03/01/19 0415  BP:  123/76 116/71   Pulse:  80 68   Resp:  14 18   Temp:  97.7 F (36.5 C)   (!) 97.5 F (36.4 C)  TempSrc: Oral     SpO2:  96% 97%   Weight:      Height:        Intake/Output Summary (Last 24 hours) at 03/01/2019 0750 Last data filed at 02/28/2019 1230 Gross per 24 hour  Intake 480 ml  Output --  Net 480 ml   Filed Weights   02/24/19 0500 02/27/19 0500 02/28/19 0500  Weight: 66 kg 67.8 kg 67.9 kg   Weight change:   Body mass index is 22.43  kg/m.  Intake/Output from previous day: 01/20 0701 - 01/21 0700 In: 480 [P.O.:480] Out: -  Intake/Output this shift: No intake/output data recorded.  Examination:  General exam: Alert awake with dysarthria,difficulty with comphrehension,follows commands HEENT:Oral mucosa moist, Ear/Nose WNL grossly, dentition normal. Respiratory system:  B/l clear, no wheezing or crackles, no use of accessory muscles. Cardiovascular system: S1 & S2 +, No JVD,. Gastrointestinal system: Abdomen soft, NT,ND, BS+. PEG+-site clean dry intact no drainage. Nervous System:Alert, awake, speech is slurred/slowed, able to move his upper and lower extremities with grip on bilateral hand. Extremities: No edema.  Bilateral upper extremities mildly weak ,  Rt grt toe top blackish Skin: No rashes,no icterus. MSK: Normal muscle bulk,tone, power  Medications:  Scheduled Meds: . amiodarone  200 mg Per Tube Daily  . apixaban  5 mg Per Tube BID  . atorvastatin  80 mg Oral q1800  . diltiazem  30 mg Oral Q6H  . divalproex  500 mg Oral Q12H  . feeding supplement (PRO-STAT SUGAR FREE 64)  30 mL Per Tube BID  . folic acid  2 mg Oral Daily  . mouth rinse  15 mL Mouth Rinse BID  . metoprolol tartrate  100 mg Per Tube BID  . QUEtiapine  150 mg Oral QHS  . QUEtiapine  50 mg Per Tube Daily  . sennosides  5 mL Per Tube BID  . vitamin B-12  1,000 mcg Per Tube Daily   Continuous Infusions: . sodium chloride 10 mL/hr at 02/01/19 0541    Data Reviewed: I have personally reviewed following labs and imaging studies  CBC: Recent Labs  Lab 02/28/19 0439  WBC 10.2  HGB 13.8  HCT 42.5  MCV 103.7*  PLT [id number]   Basic Metabolic Panel: Recent Labs  Lab 02/27/19 0458 02/28/19 0439 03/01/19 0201  NA 142 139 141  K 4.3 4.0 4.1  CL 106 103 102  CO2 27 27 30   GLUCOSE 82 88 80  BUN 21 17 17   CREATININE 0.80 0.85 0.82  CALCIUM 9.2 9.0 9.2   GFR: Estimated Creatinine Clearance: 81.7 mL/min (by C-G formula based on SCr  of 0.82 mg/dL). Liver Function Tests: No results for input(s): AST, ALT, ALKPHOS, BILITOT, PROT, ALBUMIN in the last 168 hours. No results for input(s): LIPASE, AMYLASE in the last 168 hours. No results for input(s): AMMONIA in the last 168 hours. Coagulation Profile: No results for input(s): INR, PROTIME in the last 168 hours. Cardiac Enzymes: No results for input(s): CKTOTAL, CKMB, CKMBINDEX, TROPONINI in the last 168 hours. BNP (last 3 results) No results for input(s): PROBNP in the last 8760 hours. HbA1C: No results for input(s): HGBA1C in the last 72 hours. CBG: No results for input(s): GLUCAP in the last 168 hours. Lipid Profile: No results for input(s): CHOL, HDL, LDLCALC, TRIG, CHOLHDL, LDLDIRECT in the last 72 hours. Thyroid Function Tests: No results for input(s): TSH, T4TOTAL, FREET4, T3FREE, THYROIDAB  in the last 72 hours. Anemia Panel: No results for input(s): VITAMINB12, FOLATE, FERRITIN, TIBC, IRON, RETICCTPCT in the last 72 hours. Sepsis Labs: No results for input(s): PROCALCITON, LATICACIDVEN in the last 168 hours.  Recent Results (from the past 240 hour(s))  MRSA PCR Screening     Status: None   Collection Time: 02/26/19  6:58 AM   Specimen: Nasal Mucosa; Nasopharyngeal  Result Value Ref Range Status   MRSA by PCR NEGATIVE NEGATIVE Final    Comment:        The GeneXpert MRSA Assay (FDA approved for NASAL specimens only), is one component of a comprehensive MRSA colonization surveillance program. It is not intended to diagnose MRSA infection nor to guide or monitor treatment for MRSA infections. Performed at [facility], [place] [address], [place], [place] [address]       Radiology Studies: No results found.    LOS: 42 days   Time spent: More than 50% of that time was spent in counseling and/or coordination of care.  Antonieta Pert, MD [facility]  03/01/2019, 7:50 AM

## 2019-03-01 NOTE — Progress Notes (Signed)
Physical Therapy Treatment Patient Details Name: [name] MRN: [id number] DOB: [birth year] Today's Date: 03/01/2019    History of Present Illness 16yM admitted 01/20/19 with hypertensive emergency in setting of acute L MCA stroke s/p tPA, AF/RVR.  Has been noncompliant with his warfarin for A. Fib. Intubated for respiratory failure. Trach 01/29/19 with PEG planned for 01/30/19. No sedation at time of PT eval.     PT Comments    Pt improving in mobility stability and stamina.  Still shows unsteadiness that waxes/wanes with changes in focus, fatigue and balance challenge.    Follow Up Recommendations  SNF;Supervision/Assistance - 24 hour     Equipment Recommendations  Other (comment)(TBA)    Recommendations for Other Services       Precautions / Restrictions Precautions Precautions: Fall Precaution Comments: multiple lines    Mobility  Bed Mobility Overal bed mobility: Needs Assistance Bed Mobility: Supine to Sit     Supine to sit: Supervision     General bed mobility comments: pt scooted to EOB, flexed forward using UE's, but not assist needed.  Transfers Overall transfer level: Needs assistance Equipment used: None Transfers: Sit to/from Stand Sit to Stand: Min guard Stand pivot transfers: Min guard;Min assist       General transfer comment: occasional cues for safety and stability assist with pt stepping on/off the mat on the floor.  Ambulation/Gait Ambulation/Gait assistance: Min assist;Min guard Gait Distance (Feet): 300 Feet Assistive device: None Gait Pattern/deviations: Step-through pattern Gait velocity: decr Gait velocity interpretation: 1.31 - 2.62 ft/sec, indicative of limited community ambulator General Gait Details: Episodes of mild unsteadiness, some drift, mildly variably flexed knees throughout.  Stability assist at times with scanning and times where pt is less focused on task.  Pt refused to use the RW today.   Stairs              Wheelchair Mobility    Modified Rankin (Stroke Patients Only) Modified Rankin (Stroke Patients Only) Modified Rankin: Moderately severe disability     Balance Overall balance assessment: Needs assistance Sitting-balance support: Feet supported Sitting balance-Leahy Scale: Fair       Standing balance-Leahy Scale: Fair                              Cognition Arousal/Alertness: Awake/alert Behavior During Therapy: Flat affect Overall Cognitive Status: Impaired/Different from baseline                   Orientation Level: Disoriented to;Situation;Time Current Attention Level: Sustained   Following Commands: Follows one step commands consistently;Follows one step commands with increased time Safety/Judgement: Decreased awareness of safety;Decreased awareness of deficits Awareness: Intellectual Problem Solving: Slow processing;Requires verbal cues;Requires tactile cues        Exercises      General Comments General comments (skin integrity, edema, etc.): vss, pt related he needed his glasses and he wanted water, then COKE.  pt given a honey thickened coke.  Called pt's room mate to see if he would bring glasses if they are found.      Pertinent Vitals/Pain Pain Assessment: Faces Faces Pain Scale: Hurts a little bit Pain Descriptors / Indicators: Discomfort Pain Intervention(s): Limited activity within patient's tolerance    Home Living                      Prior Function            PT Goals (  current goals can now be found in the care plan section) Acute Rehab PT Goals PT Goal Formulation: With patient Time For Goal Achievement: 02/27/19 Potential to Achieve Goals: Fair Progress towards PT goals: Progressing toward goals    Frequency    Min 3X/week      PT Plan Current plan remains appropriate    Co-evaluation              AM-PAC PT "6 Clicks" Mobility   Outcome Measure  Help needed turning from your back to  your side while in a flat bed without using bedrails?: None Help needed moving from lying on your back to sitting on the side of a flat bed without using bedrails?: None Help needed moving to and from a bed to a chair (including a wheelchair)?: A Little Help needed standing up from a chair using your arms (e.g., wheelchair or bedside chair)?: A Little Help needed to walk in hospital room?: A Little Help needed climbing 3-5 steps with a railing? : A Lot 6 Click Score: 19    End of Session   Activity Tolerance: Patient tolerated treatment well Patient left: in chair;with call bell/phone within reach;Other (comment) Nurse Communication: Mobility status PT Visit Diagnosis: Hemiplegia and hemiparesis;Other abnormalities of gait and mobility (R26.89);Other symptoms and signs involving the nervous system (R29.898) Hemiplegia - Right/Left: Right Hemiplegia - dominant/non-dominant: Dominant Hemiplegia - caused by: Cerebral infarction     Time: [id number] PT Time Calculation (min) (ACUTE ONLY): 36 min  Charges:  $Gait Training: 8-22 mins $Therapeutic Activity: 8-22 mins                     03/01/2019  [name], PT Acute Rehabilitation Services [phone]  (pager) [phone]  (office)   [name] [place] 03/01/2019, 12:16 PM

## 2019-03-01 NOTE — TOC Progression Note (Signed)
Transition of Care [facility]) - Progression Note    Patient Details  Name: [name] MRN: [id number] Date of Birth: 03/23/1950  Transition of Care [facility]) CM/SW [place], [place] Phone Number: 03/01/2019, 4:55 PM  Clinical Narrative:      CSW contacted [facility] to confirm possible bed offer- waiting on response.  Thurmond Butts, MSW, [place] Clinical Social Worker    Expected Discharge Plan: Skilled Nursing Facility Barriers to Discharge: SNF Pending bed offer, Continued Medical Work up  Expected Discharge Plan and Services Expected Discharge Plan: [place] In-house Referral: Clinical Social Work     Living arrangements for the past 2 months: Single Family Home                                       Social Determinants of Health (SDOH) Interventions    Readmission Risk Interventions No flowsheet data found.

## 2019-03-01 NOTE — Progress Notes (Signed)
  Speech Language Pathology Treatment: Dysphagia;Cognitive-Linquistic  Patient Details Name: [name] MRN: [id number] DOB: 1950-05-26 Today's Date: 03/01/2019 Time: [id number] SLP Time Calculation (min) (ACUTE ONLY): 50 min  Assessment / Plan / Recommendation Clinical Impression  Pt seen at bedside for skilled ST intervention targeting goals for communication and swallowing. Pt was awake, alert, and fully cooperative throughout the session. Goals were updated.  DYSPHAGIA: Pt continues to tolerate current diet (Dys1/honey), eating 100% of all meals per RN. Pt was observed with nectar thick liquids today, and did not exhibit overt s/s aspiration. However, previous MBS revealed silent aspiration. Recommend repeating MBS to determine appropriateness for advanced diet. Will schedule with radiology for 03/02/19.  FLUENT APHASIA: Pt continues to make gradual progress with effective communication.  Receptively, Pt is able to answer yes/no questions with 76% accuracy. He follows 1-step verbal commands with 90% accuracy. 2-step commands are currently 60% accurate, increasing accuracy with multimodal (visual and tactile) cues. Right/Left discrimination is inaccurate, likely due to increased complexity of this direction. Again, visual and tactile cues are beneficial. P is 77% accurate pointing to body parts, 40% accurate pointing to objects around his room. Pt has difficulty with reading comprehension. He was able to make the correct choice in a field of 2 written word choices, but had difficulty with phrase length comprehension.   Expressive language is more significantly impaired, and is characterized by neologistic paraphasias, and mild perseveration. He has difficulty with automatic sequences, which are largely neologisms, at 40% accuracy. He is able to repeat 1 syllable words with effort and paraphasic errors with 20% accuracy. He was able to accurately complete high probability sentence completion  with 80% accuracy. Pt is very aware of his errors, and is frustrated by his deficits.  Continued ST intervention is recommended to maximize communicative effectiveness.  Recommend communication board to facilitate effective communication of wants and needs with staff.    HPI HPI: Pt is a 69 y/o M admitted with L MCA CVA s/p TPA. He was intubated 12/11-12/13, reintubated 12/13; trach 12/21 (decannulated 1/5). PMH: tobacco use, previous strokes, GERD, COPD, AAA, PVD, HTN, HLD, pulmonary nodule, depression, afib (recently missed doses of coumadin). MBS completed 02/21/19 mild to moderate oropharyngeal dysphagia,  rec. Dys 1, honey thickened liquids.       SLP Plan  Goals updated;MBS;New goals to be determined pending instrumental study       Recommendations  Diet recommendations: Dysphagia 1 (puree);Honey-thick liquid Liquids provided via: Cup Medication Administration: Crushed with puree Supervision: Patient able to self feed;Staff to assist with self feeding Compensations: Minimize environmental distractions;Slow rate;Small sips/bites;Lingual sweep for clearance of pocketing Postural Changes and/or Swallow Maneuvers: Seated upright 90 degrees                Oral Care Recommendations: Oral care QID Follow up Recommendations: Skilled Nursing facility;24 hour supervision/assistance SLP Visit Diagnosis: Dysphagia, oropharyngeal phase (R13.12);Aphasia (R47.01) Plan: Goals updated;MBS;New goals to be determined pending instrumental study       [place]y [place] B. Quentin Ore, [facility], [place] Speech Language Pathologist Office: [phone] Pager: [phone]   Shonna Chock 03/01/2019, 10:23 AM

## 2019-03-02 ENCOUNTER — Inpatient Hospital Stay (HOSPITAL_COMMUNITY): Payer: Medicare Other

## 2019-03-02 LAB — SARS CORONAVIRUS 2 (TAT 6-24 HRS): SARS Coronavirus 2: NEGATIVE

## 2019-03-02 NOTE — Progress Notes (Signed)
PROGRESS NOTE    [name]  [id number] DOB: [birth year] DOA: 01/18/2019 PCP: Shon Baton, MD   Brief Narrative: 69 year old man with A. fib on Coumadin-who had recently missed doses hypertension, diabetes, previous history of, COPD, AAA, hypertension, hyperlipidemia, pulmonary nodule, depression brought to the ER with change in mental status, aphasia, leaning to the right found to be nonverbal and found to have acute left MCA CVA.  Patient was admitted received thrombolytics in the ED and required intubation for airway protection.  Patient completed stroke work-up.  He was treated for ventilation pneumonia/sputum grew streptococcal pneumonia and Enterobacter cloacae and was treated with antibiotics.  He had difficulty weaning from vent and subsequently needed tracheostomy and PEG tube placement.  Patient also had A. fib with RVR, B12 deficiency, iron deficiency. 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 Patient was subsequently switched to Eliquis.  Significant events: 12/10 tPA administered 12/11 intubated 12/21 Tracheostomy 12/22 PEG placed 01/04 patient pulled out trach >> left out 1/6-transferred to [facility] service 1/18-occlusion of left upper extremity- multiple vessels noted on the duplex vascular surgery was consulted-felt to be old.  Subjective:  Seen this morning. He is alert awake oriented at baseline, did well with modified and diet changed to dysphagia 2 diet  this morning. Patient still weak and unsteady with movement and needs a skilled nursing facility. Goal to understand as he is slowed and has a speech issues.  Baseline speech dysarthria, appears poor historian.  Assessment & Plan:   Acute stroke with left embolic stroke with history of CVA: Left MCA infarct status post TPA 12/10. completed stroke work-up as above.  Continue eliquis 5 mg twice daily, Lipitor 80 mg.  Atrial fibrillation-likey chronic, rate controlled on amiodarone 200 mg daily, Cardizem 120 mg, Metoprolol 100 mg bid,Eliquis.   B12 deficiency/iron deficiency:B12 level improved to 644 from 161.  Continue folic acid.    Enterobacter [id number] antibiotics 1/14  Delirium/acute metabolic encephalopathy/chronic benzo use: Mental status appears stable.  Following commands appropriately. Continue Seroquel 50 daily and 150 bedtime.    Acute hypoxic respiratory failure: Resolved.  Initially intubated, currently on room air.  Underwent tracheostomy 12/21 and he pulled out 1/4.  [name] site has healed.  Ventilator associated pneumonia sputum with history of pneumonia and Enterobacter Scalisi and was treated with antibiotic  Dysphagia with severe protein calorie malnutrition: Did well and changed to dysphagia 2 diet with thin liquids 1/22-continuous tube feeding discontinued 1/19, PEG intact. Cont PROSTAT 30 mils twice daily  Via PEG- for wound healing, Magic cup 3 times daily   Peripheral vascular disease/left upper extremity ischemia: has history of left axillary artery to brachial artery bypass in 03/2014 ART DUPLEX 1/1 " has an occlusion of the left subclavian, axillary, brachial, ulnar, and radial arteries, and there is no flow noted in the left palm. There was a study done 01/28/19 that also showed acute occlusion of the left axillary and brachial arteries" Appreciate input by VVS Dr. Louie Casa are unchanged from 2018 per vascular with chronic  occlusion of the axillary and  brachial arteries of the left side, is not a good candidate for any revascularization, again advised smoking cessation.  To consider left upper extremity arteriogram or further intervention if he develops progressive ischemic changes with ulceration and imminent limb loss in the left hand.  Discolored right Grt Toe-bilateral ABIs normal but right brachial index abnormal.  Will need follow-up with vascular.  Continue current anticoagulation.    3.9 cm ascending aortic aneurysm not abdominal aortic aneurysm.  He will need follow-up  Nutrition: per RN pt eating his 100% of meal- Dietition consulted to see if PEG feeding can be stopped Nutrition Problem: Severe Malnutrition Etiology: chronic illness(COPD) Signs/Symptoms: severe fat depletion, severe muscle depletion Interventions: Prostat, Magic cup  Pressure Ulcer: Pressure Injury 02/01/19 Sacrum Stage 2 -  Partial thickness loss of dermis presenting as a shallow open injury with a red, pink wound bed without slough. (Active)  02/01/19 1417  Location: Sacrum  Location Orientation:   Staging: Stage 2 -  Partial thickness loss of dermis presenting as a shallow open injury with a red, pink wound bed without slough.  Wound Description (Comments):   Present on Admission:     Body mass index is 22.76 kg/m.    DVT prophylaxis: eliquis Code Status: full Family Communication: plan of care discussed with patient at bedside.  Discussed with the nursing staff.  No family at bedside. Disposition Plan: Remains inpatient pending skilled nursing facility placement.anticipating skilled nursing facility placement soon, awaiting for offers.  Will order COVID-19 and he can be discharged to SNF over the weekend if bed available.  Consultants: PCCM,Neurology,Hematology, palliative care,General surgery, VVS Procedures: TPA, intubation, tracheostomy, PEG tube placement  ABI lower extremities Right: Resting right ankle-brachial index is within normal range.  No evidence of significant right lower extremity arterial disease. The right toe-brachial index is abnormal. Left: Resting left ankle-brachial index is within normal range. No evidence of significant left lower extremity arterial disease.  Left upper extremities vascular studies Occlusion of the left subclavian, axillary, brachial, ulnar, and radial arteries, and there is no flow noted in the left palm. There was a study done 01/28/19 that also showed acute occlusion of the left axillary and brachial arteries"  Microbiology: Antimicrobials: Anti-infectives (From admission, onward)   Start     Dose/Rate Route Frequency Ordered Stop   02/16/19 1045  cefTRIAXone (ROCEPHIN) 1 g in sodium chloride 0.9 % 100 mL IVPB     1 g 200 mL/hr over 30 Minutes Intravenous Every 24 hours 02/16/19 1042 02/22/19 1232   02/12/19 1800  cefTRIAXone (ROCEPHIN) 1 g in sodium chloride 0.9 % 100 mL IVPB     1 g 200 mL/hr over 30 Minutes Intravenous  Once 02/12/19 1348 02/13/19 0412   02/10/19 1430  cefTRIAXone (ROCEPHIN) 1 g in sodium chloride 0.9 % 100 mL IVPB     1 g 200 mL/hr over 30 Minutes Intravenous Every 24 hours 02/10/19 1347 02/11/19 1754   01/26/19 1000  ceFEPIme (MAXIPIME) 2 g in sodium chloride 0.9 % 100 mL IVPB     2 g 200 mL/hr over 30 Minutes Intravenous Every 8 hours 01/26/19 0955 02/02/19 0145   01/25/19 1000  Ampicillin-Sulbactam (UNASYN) 3 g in sodium chloride 0.9 % 100 mL IVPB  Status:  Discontinued     3 g 200 mL/hr over 30 Minutes Intravenous Every 8 hours 01/25/19 0810 01/26/19 0955     Objective: Vitals:   03/02/19 0312 03/02/19 0500 03/02/19 0748 03/02/19 1136  BP: 109/80  (!) 149/96 (!) 148/96  Pulse: 68  81 65  Resp: 18  15 17   Temp: 97.7 F (36.5 C)  97.9 F (36.6 C) 98.5 F (36.9 C)  TempSrc: Axillary  Oral Oral  SpO2: 100%  99% 95%  Weight:  68.9 kg    Height:        Intake/Output Summary (Last 24 hours) at 03/02/2019 1139 Last data filed at 03/02/2019 0745 Gross per 24  hour  Intake 840 ml  Output 1450 ml  Net -610 ml   Filed Weights   02/27/19 0500 02/28/19 0500 03/02/19 0500  Weight: 67.8 kg 67.9 kg 68.9 kg   Weight change:   Body mass index is 22.76 kg/m.  Intake/Output from previous day: 01/21 0701 - 01/22 0700 In: 1080 [P.O.:1080] Out: 2050 [Urine:2050] Intake/Output this shift: Total I/O In: 240 [P.O.:240] Out: -   Examination:  General exam: Alert awake speech is slowed difficult to comprehend takes time to respond.   HEENT:Oral mucosa moist, Ear/Nose WNL grossly, dentition normal. Respiratory system:  B/l clear, no wheezing or crackles, no use of accessory muscles. Cardiovascular system: S1 & S2 +, No JVD,. Gastrointestinal system: Abdomen soft, NT,ND, BS+. PEG+-site clean dry intact no drainage. Nervous System:Alert, awake, speech is slurred/slowed, able to move his upper and lower extremities with grip on bilateral hand. Extremities: No edema.  Bilateral upper extremities mildly weak ,  Rt grt toe top blackish Skin: No rashes,no icterus. MSK: Normal muscle bulk,tone, power  Medications:  Scheduled Meds: . amiodarone  200 mg Per Tube Daily  . apixaban  5 mg Per Tube BID  . atorvastatin  80 mg Oral q1800  . diltiazem  120 mg Oral Daily  . divalproex  500 mg Oral Q12H  . feeding supplement (PRO-STAT SUGAR FREE 64)  30 mL Per Tube BID  . folic acid  2 mg Oral Daily  . mouth rinse  15 mL Mouth Rinse BID  . metoprolol tartrate  100 mg Per Tube BID  . QUEtiapine  150 mg Oral QHS  . QUEtiapine  50 mg Per Tube Daily  . sennosides  5 mL Per Tube BID  . vitamin B-12  1,000 mcg Per Tube Daily   Continuous Infusions: . sodium chloride 10 mL/hr at 02/01/19 0541    Data Reviewed: I have personally reviewed following labs and imaging studies  CBC: Recent Labs  Lab 02/28/19 0439  WBC 10.2  HGB 13.8  HCT 42.5  MCV 103.7*  PLT [id number]   Basic Metabolic Panel: Recent Labs  Lab 02/27/19 0458 02/28/19 0439 03/01/19 0201  NA  142 139 141  K 4.3 4.0 4.1  CL 106 103 102  CO2 27 27 30   GLUCOSE 82 88 80  BUN 21 17 17   CREATININE 0.80 0.85 0.82  CALCIUM 9.2 9.0 9.2   GFR: Estimated Creatinine Clearance: 82.9 mL/min (by C-G formula based on SCr of 0.82 mg/dL). Liver Function Tests: No results for input(s): AST, ALT, ALKPHOS, BILITOT, PROT, ALBUMIN in the last 168 hours. No results for input(s): LIPASE, AMYLASE in the last 168 hours. No results for input(s): AMMONIA in the last 168 hours. Coagulation Profile: No results for input(s): INR, PROTIME in the last 168 hours. Cardiac Enzymes: No results for input(s): CKTOTAL, CKMB, CKMBINDEX, TROPONINI in the last 168 hours. BNP (last 3 results) No results for input(s): PROBNP in the last 8760 hours. HbA1C: No results for input(s): HGBA1C in the last 72 hours. CBG: No results for  input(s): GLUCAP in the last 168 hours. Lipid Profile: No results for input(s): CHOL, HDL, LDLCALC, TRIG, CHOLHDL, LDLDIRECT in the last 72 hours. Thyroid Function Tests: No results for input(s): TSH, T4TOTAL, FREET4, T3FREE, THYROIDAB in the last 72 hours. Anemia Panel: No results for input(s): VITAMINB12, FOLATE, FERRITIN, TIBC, IRON, RETICCTPCT in the last 72 hours. Sepsis Labs: No results for input(s): PROCALCITON, LATICACIDVEN in the last 168 hours.  Recent Results (from the past 240 hour(s))  MRSA PCR Screening     Status: None   Collection Time: 02/26/19  6:58 AM   Specimen: Nasal Mucosa; Nasopharyngeal  Result Value Ref Range Status   MRSA by PCR NEGATIVE NEGATIVE Final    Comment:        The GeneXpert MRSA Assay (FDA approved for NASAL specimens only), is one component of a comprehensive MRSA colonization surveillance program. It is not intended to diagnose MRSA infection nor to guide or monitor treatment for MRSA infections. Performed at [facility], [place] [address], [place], [place] [address]       Radiology Studies: No results found.    LOS: 43 days     Time spent: More than 50% of that time was spent in counseling and/or coordination of care.  Antonieta Pert, MD [facility]  03/02/2019, 11:39 AM

## 2019-03-02 NOTE — Progress Notes (Signed)
Modified Barium Swallow Progress Note  Patient Details  Name: [name] MRN: [id number] Date of Birth: Jun 02, 1950  Today's Date: 03/02/2019  Modified Barium Swallow completed.  Full report located under Chart Review in the Imaging Section.  Brief recommendations include the following:  Clinical Impression  Pt seen for repeat MBS to determine appropriateness for advanced diet. Pt has been tolerating puree and honey thick liquids since MBS 02/21/19, and has been making good progress in therapy.  Pt presents with improved swallow function vs prior MBS. Orally, pt exhibits extended oral prep of solids, likely due to being edentulous. Liquids, puree, and barium tablet were cleared from the oral cavity without difficulty, delay, or residue. Pharyngeally, pt exhibits delayed swallow reflex, with trigger at the pyriform sinus on thin and nectar thick liquids, and at the vallecular sinus on puree, solid, and barium tablet given with liquid. No post-swallow residue was seen on any texture. Trace penetration was seen intermittently on thin and nectar thick liquids, moreso with larger boluses. No aspiration was seen on any consistency.    Recommend advancing to dys 2/thin with meds given whole, one at a time. Anticipate being able to advance solids at bedside as strength and endurance improve. Safe swallow precautions were sent with pt back to room and MD/RN were notified. SLP will continue to follow pt acutely for diet tolerance/advancement and language treatment.   Swallow Evaluation Recommendations SLP Diet Recommendations: Dysphagia 2 (Fine chop) solids;Thin liquid   Liquid Administration via: Cup;Straw   Medication Administration: Whole meds with puree   Supervision: Patient able to self feed;Staff to assist with self feeding   Compensations: Minimize environmental distractions;Slow rate;Small sips/bites   Postural Changes: Seated upright at 90 degrees   Oral Care Recommendations: Oral  care BID     Bryttney Netzer B. Quentin Ore, [facility], [place] Speech Language Pathologist Office: [phone] Pager: [phone]   Shonna Chock 03/02/2019,1:16 PM

## 2019-03-03 DIAGNOSIS — R2689 Other abnormalities of gait and mobility: Secondary | ICD-10-CM | POA: Diagnosis not present

## 2019-03-03 DIAGNOSIS — I739 Peripheral vascular disease, unspecified: Secondary | ICD-10-CM | POA: Diagnosis not present

## 2019-03-03 DIAGNOSIS — L97519 Non-pressure chronic ulcer of other part of right foot with unspecified severity: Secondary | ICD-10-CM | POA: Diagnosis not present

## 2019-03-03 DIAGNOSIS — I69320 Aphasia following cerebral infarction: Secondary | ICD-10-CM | POA: Diagnosis not present

## 2019-03-03 DIAGNOSIS — J449 Chronic obstructive pulmonary disease, unspecified: Secondary | ICD-10-CM | POA: Diagnosis not present

## 2019-03-03 DIAGNOSIS — F329 Major depressive disorder, single episode, unspecified: Secondary | ICD-10-CM | POA: Diagnosis not present

## 2019-03-03 DIAGNOSIS — E785 Hyperlipidemia, unspecified: Secondary | ICD-10-CM | POA: Diagnosis not present

## 2019-03-03 DIAGNOSIS — I6930 Unspecified sequelae of cerebral infarction: Secondary | ICD-10-CM | POA: Diagnosis not present

## 2019-03-03 DIAGNOSIS — M6281 Muscle weakness (generalized): Secondary | ICD-10-CM | POA: Diagnosis not present

## 2019-03-03 DIAGNOSIS — R402411 Glasgow coma scale score 13-15, in the field [EMT or ambulance]: Secondary | ICD-10-CM | POA: Diagnosis not present

## 2019-03-03 DIAGNOSIS — I10 Essential (primary) hypertension: Secondary | ICD-10-CM | POA: Diagnosis not present

## 2019-03-03 DIAGNOSIS — R1312 Dysphagia, oropharyngeal phase: Secondary | ICD-10-CM | POA: Diagnosis not present

## 2019-03-03 DIAGNOSIS — T17908D Unspecified foreign body in respiratory tract, part unspecified causing other injury, subsequent encounter: Secondary | ICD-10-CM | POA: Diagnosis not present

## 2019-03-03 DIAGNOSIS — Z7401 Bed confinement status: Secondary | ICD-10-CM | POA: Diagnosis not present

## 2019-03-03 DIAGNOSIS — J9601 Acute respiratory failure with hypoxia: Secondary | ICD-10-CM | POA: Diagnosis not present

## 2019-03-03 DIAGNOSIS — I70208 Unspecified atherosclerosis of native arteries of extremities, other extremity: Secondary | ICD-10-CM | POA: Diagnosis not present

## 2019-03-03 DIAGNOSIS — E119 Type 2 diabetes mellitus without complications: Secondary | ICD-10-CM | POA: Diagnosis not present

## 2019-03-03 DIAGNOSIS — G8929 Other chronic pain: Secondary | ICD-10-CM | POA: Diagnosis not present

## 2019-03-03 DIAGNOSIS — R000 Tachycardia, unspecified: Secondary | ICD-10-CM | POA: Diagnosis not present

## 2019-03-03 DIAGNOSIS — G9341 Metabolic encephalopathy: Secondary | ICD-10-CM | POA: Diagnosis not present

## 2019-03-03 DIAGNOSIS — I63119 Cerebral infarction due to embolism of unspecified vertebral artery: Secondary | ICD-10-CM | POA: Diagnosis not present

## 2019-03-03 DIAGNOSIS — J9621 Acute and chronic respiratory failure with hypoxia: Secondary | ICD-10-CM | POA: Diagnosis not present

## 2019-03-03 DIAGNOSIS — I63432 Cerebral infarction due to embolism of left posterior cerebral artery: Secondary | ICD-10-CM | POA: Diagnosis not present

## 2019-03-03 DIAGNOSIS — I69391 Dysphagia following cerebral infarction: Secondary | ICD-10-CM | POA: Diagnosis not present

## 2019-03-03 DIAGNOSIS — I119 Hypertensive heart disease without heart failure: Secondary | ICD-10-CM | POA: Diagnosis not present

## 2019-03-03 DIAGNOSIS — H9319 Tinnitus, unspecified ear: Secondary | ICD-10-CM | POA: Diagnosis not present

## 2019-03-03 DIAGNOSIS — E538 Deficiency of other specified B group vitamins: Secondary | ICD-10-CM | POA: Diagnosis not present

## 2019-03-03 DIAGNOSIS — I714 Abdominal aortic aneurysm, without rupture: Secondary | ICD-10-CM | POA: Diagnosis not present

## 2019-03-03 DIAGNOSIS — R278 Other lack of coordination: Secondary | ICD-10-CM | POA: Diagnosis not present

## 2019-03-03 DIAGNOSIS — I639 Cerebral infarction, unspecified: Secondary | ICD-10-CM | POA: Diagnosis not present

## 2019-03-03 DIAGNOSIS — N401 Enlarged prostate with lower urinary tract symptoms: Secondary | ICD-10-CM | POA: Diagnosis not present

## 2019-03-03 DIAGNOSIS — R4701 Aphasia: Secondary | ICD-10-CM | POA: Diagnosis not present

## 2019-03-03 DIAGNOSIS — Z23 Encounter for immunization: Secondary | ICD-10-CM | POA: Diagnosis not present

## 2019-03-03 DIAGNOSIS — R569 Unspecified convulsions: Secondary | ICD-10-CM | POA: Diagnosis not present

## 2019-03-03 DIAGNOSIS — I4891 Unspecified atrial fibrillation: Secondary | ICD-10-CM | POA: Diagnosis not present

## 2019-03-03 DIAGNOSIS — M2550 Pain in unspecified joint: Secondary | ICD-10-CM | POA: Diagnosis not present

## 2019-03-03 DIAGNOSIS — R2681 Unsteadiness on feet: Secondary | ICD-10-CM | POA: Diagnosis not present

## 2019-03-03 DIAGNOSIS — I482 Chronic atrial fibrillation, unspecified: Secondary | ICD-10-CM | POA: Diagnosis not present

## 2019-03-03 DIAGNOSIS — G459 Transient cerebral ischemic attack, unspecified: Secondary | ICD-10-CM | POA: Diagnosis not present

## 2019-03-03 MED ORDER — CYANOCOBALAMIN 1000 MCG PO TABS: 1000.0000 ug | ORAL_TABLET | Freq: Every day | ORAL | 1 refills | Status: AC

## 2019-03-03 MED ORDER — APIXABAN 5 MG PO TABS: 5.0000 mg | ORAL_TABLET | Freq: Two times a day (BID) | ORAL | 1 refills | Status: AC

## 2019-03-03 MED ORDER — METOPROLOL TARTRATE 25 MG/10 ML ORAL SUSPENSION: 100.0000 mg | Freq: Two times a day (BID) | ORAL | 1 refills | Status: DC

## 2019-03-03 MED ORDER — QUETIAPINE FUMARATE 50 MG PO TABS: 50.0000 mg | ORAL_TABLET | Freq: Every day | ORAL | 1 refills | Status: DC

## 2019-03-03 MED ORDER — PRO-STAT SUGAR FREE PO LIQD: 30.0000 mL | Freq: Two times a day (BID) | ORAL | 0 refills | Status: DC

## 2019-03-03 MED ORDER — AMIODARONE HCL 200 MG PO TABS: 200.0000 mg | ORAL_TABLET | Freq: Every day | ORAL | 1 refills | Status: AC

## 2019-03-03 MED ORDER — ALUM & MAG HYDROXIDE-SIMETH 200-200-20 MG/5ML PO SUSP: 30.0000 mL | Freq: Four times a day (QID) | ORAL | 0 refills | Status: DC | PRN

## 2019-03-03 MED ORDER — DIVALPROEX SODIUM 125 MG PO CSDR: 500.0000 mg | DELAYED_RELEASE_CAPSULE | Freq: Two times a day (BID) | ORAL | 1 refills | Status: AC

## 2019-03-03 MED ORDER — ORAL CARE MOUTH RINSE: 15.0000 mL | Freq: Two times a day (BID) | OROMUCOSAL | 1 refills | Status: DC

## 2019-03-03 MED ORDER — DILTIAZEM HCL ER COATED BEADS 120 MG PO CP24: 120.0000 mg | ORAL_CAPSULE | Freq: Every day | ORAL | 1 refills | Status: AC

## 2019-03-03 MED ORDER — QUETIAPINE FUMARATE 50 MG PO TABS: 150.0000 mg | ORAL_TABLET | Freq: Every day | ORAL | 1 refills | Status: AC

## 2019-03-03 MED ORDER — FOLIC ACID 1 MG PO TABS: 2.0000 mg | ORAL_TABLET | Freq: Every day | ORAL | 1 refills | Status: AC

## 2019-03-03 NOTE — Discharge Summary (Signed)
Physician Discharge Summary  JANIS LOACH [id number] DOB: 1950-02-25 DOA: 01/18/2019  PCP: Shon Baton, MD  Admit date: 01/18/2019 Discharge date: 03/03/2019  Admitted From: Home Disposition:  SNF  Recommendations for Outpatient Follow-up:  1. Follow up with PCP in 1-2 weeks 2. Please obtain BMP/CBC in one week   Home Health:No Equipment/Devices:None Discharge Condition:Stable CODE STATUS:Full Diet recommendation:  Dysphagia   Brief/Interim Summary: 69 year old man with A. fib on Coumadin-who had recently missed doses hypertension, diabetes, previous history of, COPD, AAA, hypertension, hyperlipidemia, pulmonary nodule, depression brought to the ER with change in mental status, aphasia, leaning to the right found to be nonverbal and found to have acute left MCA CVA.  Patient was admitted received thrombolytics in the ED and required intubation for airway protection.  Patient completed stroke work-up.  He was treated for ventilation pneumonia/sputum grew streptococcal pneumonia and Enterobacter cloacae and was treated with antibiotics.  He had difficulty weaning from vent and subsequently needed tracheostomy and PEG tube placement.  Patient also had A. fib with RVR, B12 deficiency, iron deficiency. 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 Patient was subsequently switched to Eliquis.  Significant events: 12/10 tPA administered 12/11 intubated 12/21 Tracheostomy 12/22  PEG placed 01/04 patient pulled out trach >> left out 1/6-transferred to [facility] service 1/18-occlusion of left upper extremity- multiple vessels noted on the duplex vascular surgery was consulted-felt to be old.   Patient will be discharged to SNF for continued PT/OT/ST following his embolic CVA.  Continue treatment as below  Discharge Diagnoses:  Principal Problem:   Embolic stroke ([place]) Active Problems:   TOBACCO ABUSE   HYPERTENSION, BENIGN ESSENTIAL   Atrial fibrillation with RVR (HCC)   PAD (peripheral artery disease) (HCC)   Abdominal aortic aneurysm (AAA) (HCC)   Hyperlipemia   Aspiration into airway   Endotracheal tube present   Acute on chronic respiratory failure (HCC)   Protein-calorie malnutrition, severe   Pressure injury of skin   B12 deficiency   Hx of embolic stroke   Dysphagia due to recent cerebrovascular accident   VAP (ventilator-associated pneumonia) (HCC)   Thrombocytosis (HCC)   Fe deficiency anemia   Fever   Tachycardia   Tachypnea  Acute stroke with left embolic stroke with history of CVA: Left MCA infarct status post TPA 12/10. completed stroke work-up as above.  Continue eliquis 5 mg twice daily, Lipitor 80 mg.  Atrial fibrillation-likey chronic, rate controlled on amiodarone 200 mg daily, Cardizem 120 mg, Metoprolol 100 mg bid,Eliquis.   B12 deficiency/iron deficiency:B12 level improved to 644 from 161.  Continue folic acid.    Enterobacter [id number] antibiotics 1/14  Delirium/acute metabolic encephalopathy/chronic benzo use: Mental status appears stable.  Following commands appropriately. Continue Seroquel 50 daily and 150 bedtime.    Acute hypoxic respiratory failure: Resolved.  Initially intubated, currently on room air.  Underwent tracheostomy 12/21 and he pulled out 1/4.  [name] site has healed.  Ventilator associated pneumonia sputum with history of pneumonia and Enterobacter Scalisi and was treated with antibiotic  Dysphagia with  severe protein calorie malnutrition: Did well and changed to dysphagia 2 diet with thin liquids 1/22-continuous tube feeding discontinued 1/19, PEG intact. Cont PROSTAT 30 mils twice daily  Via PEG- for wound healing, Magic cup 3 times daily. Continue dysphagia diet at SNF. Advance as tolerated per speech therapy  Peripheral vascular disease/left upper extremity ischemia: has history of left axillary artery to brachial artery bypass in 03/2014 ART DUPLEX 1/1 " has an occlusion of the left subclavian, axillary, brachial, ulnar, and radial arteries, and there is no flow noted in the left palm. There was a study done 01/28/19 that also showed acute occlusion of the left axillary and brachial arteries" Appreciate input by VVS Dr. Louie Casa are unchanged from 2018 per vascular with chronic occlusion of the axillary and brachial arteries of the left side, is not a good candidate for any revascularization, again advised smoking cessation.  To consider left upper extremity arteriogram or further intervention if he develops progressive ischemic changes with ulceration and imminent limb loss in the left hand.  Discolored right Grt Toe-bilateral ABIs normal but right brachial index abnormal.  Will need follow-up with vascular.  Continue current anticoagulation.    3.9 cm ascending aortic aneurysm not abdominal aortic aneurysm.  He will need follow-up  Nutrition: per RN pt eating his 100% of meal- Dietition consulted to see if PEG feeding can be stopped Nutrition Problem: Severe Malnutrition Etiology: chronic illness(COPD) Signs/Symptoms: severe fat depletion, severe muscle depletion Interventions: Prostat, Magic cup  Pressure Ulcer: Pressure Injury 02/01/19 Sacrum Stage 2 -  Partial thickness loss of dermis presenting as a shallow open injury with a red, pink wound bed without slough. (Active)  02/01/19 1417  Location: Sacrum  Location Orientation:   Staging: Stage 2 -  Partial thickness loss of  dermis presenting as a shallow open injury with a red, pink wound bed without slough.  Wound Description (Comments):   Present on Admission:      Discharge Instructions   Allergies as of 03/03/2019      Reactions   Oxycodone Itching      Medication List    STOP taking these medications   ALPRAZolam 0.5 MG tablet Commonly known as: XANAX   calcium carbonate 500 MG chewable tablet Commonly known as: TUMS - dosed in mg elemental calcium   diltiazem 240 MG 24 hr capsule Commonly known as: TIAZAC   gabapentin 100 MG capsule Commonly known as: NEURONTIN   losartan 50 MG tablet Commonly known as: COZAAR   metoprolol tartrate 100 MG tablet Commonly known as: LOPRESSOR Replaced by: metoprolol tartrate 25 mg/10 mL Susp   warfarin 5 MG tablet Commonly known as: COUMADIN     TAKE these medications   alum & mag hydroxide-simeth 200-200-20 MG/5ML suspension Commonly known as: MAALOX/MYLANTA Take 30 mLs by mouth every 6 (six) hours as needed for indigestion or heartburn.   amiodarone 200 MG tablet Commonly known as: PACERONE Place 1 tablet (200 mg total) into feeding tube daily. Start taking on: March 04, 2019   apixaban 5 MG Tabs tablet Commonly known as: ELIQUIS Place 1 tablet (5 mg total) into feeding tube 2 (two) times daily.   atorvastatin 40 MG tablet Commonly known as: LIPITOR Take 1 tablet (40 mg total) by mouth daily. For high cholesterol   cyanocobalamin 1000 MCG tablet Place 1 tablet (1,000 mcg total) into feeding tube daily. Start taking on: March 04, 2019   diltiazem 120 MG 24 hr capsule Commonly known as: CARDIZEM  CD Take 1 capsule (120 mg total) by mouth daily. Start taking on: March 04, 2019   divalproex 125 MG capsule Commonly known as: DEPAKOTE SPRINKLE Take 4 capsules (500 mg total) by mouth every 12 (twelve) hours.   feeding supplement (PRO-STAT SUGAR FREE 64) Liqd Place 30 mLs into feeding tube 2 (two) times daily.   folic acid 1 MG  tablet Commonly known as: FOLVITE Take 2 tablets (2 mg total) by mouth daily. Start taking on: March 04, 2019   furosemide 20 MG tablet Commonly known as: LASIX Take 20 mg by mouth daily.   metoprolol tartrate 25 mg/10 mL Susp Commonly known as: LOPRESSOR Place 40 mLs (100 mg total) into feeding tube 2 (two) times daily. Replaces: metoprolol tartrate 100 MG tablet   mouth rinse Liqd solution 15 mLs by Mouth Rinse route 2 (two) times daily.   QUEtiapine 50 MG tablet Commonly known as: SEROQUEL Take 3 tablets (150 mg total) by mouth at bedtime.   QUEtiapine 50 MG tablet Commonly known as: SEROQUEL Place 1 tablet (50 mg total) into feeding tube daily.   traMADol 50 MG tablet Commonly known as: ULTRAM Take 50-100 mg by mouth every 6 (six) hours as needed for moderate pain.   TYLENOL 500 MG tablet Generic drug: acetaminophen Take 500 mg by mouth every 6 (six) hours as needed for mild pain.       Allergies  Allergen Reactions  . Oxycodone Itching    Consultations: PCCM,Neurology,Hematology, palliative care,General surgery, VVS   Procedures/Studies: IR Replc Gastro/Colonic Tube Percut W/Fluoro  Result Date: 02/17/2019 INDICATION: 69 year old with a dislodged gastrostomy tube. Foley catheter has been placed to keep tract intact. EXAM: GASTROSTOMY TUBE REPLACEMENT WITH FLUOROSCOPY MEDICATIONS: None ANESTHESIA/SEDATION: None CONTRAST:  25 mL Omnipaque 300-administered into the gastric lumen. FLUOROSCOPY TIME:  Fluoroscopy Time: 30 seconds, 3 mGy COMPLICATIONS: None immediate. PROCEDURE: Foley catheter was within the gastrostomy tube tract. The Foley catheter and surrounding skin was prepped and draped in sterile fashion. Contrast injection confirmed placement in the stomach. Foley catheter balloon deflated and tube removed. An 75 French balloon retention gastrostomy tube was easily advanced into the stomach. Balloon was inflated with approximately 8 mL of saline. Contrast  injection confirmed placement in the stomach. Fluoroscopic images were taken and saved for this procedure. IMPRESSION: Successful replacement of the gastrostomy tube. Patient now has an 62 French balloon retention gastrostomy tube. Electronically Signed   By: Markus Daft M.D.   On: 02/17/2019 18:19   DG CHEST PORT 1 VIEW  Result Date: 02/16/2019 CLINICAL DATA:  69 year old male with a history of fever EXAM: PORTABLE CHEST 1 VIEW COMPARISON:  02/09/2019 FINDINGS: Cardiomediastinal silhouette unchanged in size and contour. No pneumothorax. No pleural effusion. Improved aeration compared to the prior plain film. No interlobular septal thickening. No displaced fracture IMPRESSION: Improved aeration compared to the prior with no evidence of acute cardiopulmonary disease Electronically Signed   By: Corrie Mckusick D.O.   On: 02/16/2019 12:37   DG CHEST PORT 1 VIEW  Result Date: 02/09/2019 CLINICAL DATA:  Chronic lung disease, leukocytosis EXAM: PORTABLE CHEST 1 VIEW COMPARISON:  Chest radiograph 02/04/2019, 01/29/2019 FINDINGS: Stable cardiomediastinal contours. Tracheostomy in place. There is improved aeration at the right lung base. Mildly increased left basilar consolidation. Probable small left pleural effusion. No pneumothorax. IMPRESSION: 1. Improved aeration at the right lung base. 2. Mildly increased left basilar consolidation which may represent atelectasis. Probable small left pleural effusion. Electronically Signed   By: Jac Canavan.D.  On: 02/09/2019 12:00   DG CHEST PORT 1 VIEW  Result Date: 02/04/2019 CLINICAL DATA:  69 year old male with chronic lung disease, leukocytosis. EXAM: PORTABLE CHEST 1 VIEW COMPARISON:  Portable chest 01/29/2019 and earlier. FINDINGS: Portable AP semi upright view at 0526 hours. Stable tracheostomy. Feeding tube has been removed. Stable lung volumes and ventilation with veiling bibasilar and dense retrocardiac opacity. No superimposed pneumothorax and pulmonary  vascularity in the upper lobes seems to remain normal. IMPRESSION: 1. Feeding tube removed.  Stable tracheostomy. 2. Stable ventilation since 01/29/2019 with suspected bilateral pleural effusions and lower lobe collapse. Electronically Signed   By: Genevie Ann M.D.   On: 02/04/2019 07:23   DG Swallowing Func-Speech Pathology  Result Date: 03/02/2019 Objective Swallowing Evaluation: Type of Study: MBS-Modified Barium Swallow Study  Patient Details Name: MASSIE SILENCE MRN: [id number] Date of Birth: [birth year] Today's Date: 03/02/2019 Time: SLP Start Time (ACUTE ONLY): 0930 -SLP Stop Time (ACUTE ONLY): 1000 SLP Time Calculation (min) (ACUTE ONLY): 30 min Past Medical History: Past Medical History: Diagnosis Date . AAA (abdominal aortic aneurysm) ([place])   per last CT 03-26-2016---  3.9cm . Anticoagulated on Coumadin  . Arthritis of spine  . Benign localized prostatic hyperplasia with lower urinary tract symptoms (LUTS)  . Chronic atrial fibrillation ([place]) cardiolgoist -- dr Stanford Breed  dx 2009 . Complication of anesthesia   per pt today (07-08-2016) had problem after having anesthesia 12/ 2016 surgery [facility] "not good" went to ED ( in epic ED visit 01/ 2017 dx severe episode of major depression disorder and admitted to [facility] . COPD with emphysema ([place])  . Dyspnea on exertion  . Full dentures  . GERD (gastroesophageal reflux disease)  . Hematuria  . History of amaurosis fugax   right eye 02-08-2015 -- resolved /  documented in epic possible left eye amaurosis fugax 11/ 2008 (pt denies) / per last MRI show previous bilateral occipital lobe infarcts . History of concussion   as child . History of CVA (cerebrovascular accident) 10/ 2016  and per pt Dec 2017 in [place]  neurologist-  dr Leonie Man-- per note silent infarcts on MRI-- multiple acute infarcts bilateral cerebullm, right temporal lobe, and bilateral occipital lobes due to embolic phenomena (atrial fib.) . History of seizures as a child  . Hyperlipidemia  .  Hypertension  . Left arm numbness   occasional numbess post residual axilla arterial occlusion and surgery . Major depressive disorder   hx severe episode without psychosis 02/2015 w/ homicidal ideation . Narcotic dependence ([place])  . PAD (peripheral artery disease) ([place])   followed by dr fields-- s/p  left axilla to branchial and left axilla to ulnar bypass graft due to ischemic hand . Pulmonary nodule   per CT 03-26-2016  right lower lobe . PVD (peripheral vascular disease) ([place])  . Right ureteral stone  . Senile purpura ([place])  . Weakness of left arm   residual from axilla arterial occlusion and post surgery . Wears glasses  Past Surgical History: Past Surgical History: Procedure Laterality Date . ARCH AORTOGRAM N/A 04/05/2014  Procedure: ARCH AORTOGRAM, FIRST ORDER CATHETERIZATION LEFT SUBCLAVIAN ARTERY;  Surgeon: Elam Dutch, MD;  Location: [facility] OR;  Service: Vascular;  Laterality: N/A; . AXILLARY-FEMORAL BYPASS GRAFT Left 04/08/2014  Procedure: LEFT AXILLARY ARTERY TO BRACHIAL ARTERY BYPASS USING NON REVERSE LEFT GREATER SAPHENOUS VEIN ,LIGATION OF LEFT AXILLARY ARTERY ANEURYSM;  Surgeon: Elam Dutch, MD;  Location: [place];  Service: Vascular;  Laterality: Left; . BYPASS AXILLA/BRACHIAL ARTERY Left  01/27/2015  Procedure: LEFT BRACHIAL-ULAR ARTERY BYPASS USING GREATER SAPHENOUS VEIN;  Surgeon: Elam Dutch, MD;  Location: [place];  Service: Vascular;  Laterality: Left; . CYSTOSCOPY WITH RETROGRADE PYELOGRAM, URETEROSCOPY AND STENT PLACEMENT Right 07/17/2016  Procedure: CYSTOSCOPY WITH RETROGRADE PYELOGRAM, URETEROSCOPY AND STENT PLACEMENT,LASER;  Surgeon: Festus Aloe, MD;  Location: [facility];  Service: Urology;  Laterality: Right; . CYSTOSCOPY/URETEROSCOPY/HOLMIUM LASER/STENT PLACEMENT Right 07/13/2016  Procedure: CYSTOSCOPY, RIGHT URETEROSCOPY RETROGRADE PYELOGRAM;  Surgeon: Kathie Rhodes, MD;  Location: [facility];  Service: Urology;  Laterality: Right; . EMBOLECTOMY Left 04/05/2014   Procedure: THROMBO ENDARTERECTOMY OF LEFT BRACHIAL, RADIAL AND ULNAR ARTERY,  Left Radial artery cut down and radial artery thrombectomy.;  Surgeon: Elam Dutch, MD;  Location: [facility] OR;  Service: Vascular;  Laterality: Left; . ESOPHAGOGASTRODUODENOSCOPY N/A 01/30/2019  Procedure: ESOPHAGOGASTRODUODENOSCOPY (EGD);  Surgeon: Jesusita Oka, MD;  Location: [facility] ENDOSCOPY;  Service: General;  Laterality: N/A; . facial cyst Right   cyst removal.  . IR REPLC GASTRO/COLONIC TUBE PERCUT W/FLUORO  02/17/2019 . PATCH ANGIOPLASTY Left 04/05/2014  Procedure: LEFT ARM VEIN PATCH ANGIOPLASTY;  Surgeon: Elam Dutch, MD;  Location: [place];  Service: Vascular;  Laterality: Left; . PEG PLACEMENT N/A 01/30/2019  Procedure: PERCUTANEOUS ENDOSCOPIC GASTROSTOMY (PEG) PLACEMENT;  Surgeon: Jesusita Oka, MD;  Location: [place];  Service: General;  Laterality: N/A; . PERIPHERAL VASCULAR CATHETERIZATION N/A 01/22/2015  Procedure: Aortic Arch Angiography;  Surgeon: Elam Dutch, MD;  Location: [place] CV LAB;  Service: Cardiovascular;  Laterality: N/A; . THROMBECTOMY BRACHIAL ARTERY Left 11/20/2014  Procedure: 1.  Thrombectomy Left Axilo-Brachial Bypass  2.  Thromboendarterecotmy of Left Brachial Artery with Fogarty Thrombectomy of Radial and Ulnar Arteries with Dacron patch angioplasty Left Brachial Artery. 3. Intraoperative  Arteriogram times four.;  Surgeon: Mal Misty, MD;  Location: [place];  Service: Vascular;  Laterality: Left; . TRANSTHORACIC ECHOCARDIOGRAM  11/25/2014  moderate concentric LVH, ef 60-65%, unable to evaluation LVDF due to atrial fib/  mild MR/  severe LAE . VEIN HARVEST Left 04/08/2014  Procedure: LEFT GREATER SAPHENOUS VEIN HARVEST;  Surgeon: Elam Dutch, MD;  Location: [place];  Service: Vascular;  Laterality: Left; Marland Kitchen VEIN HARVEST Right 01/27/2015  Procedure: RIGHT GREATER SAPHENOUS VEIN HARVEST;  Surgeon: Elam Dutch, MD;  Location: [place];  Service: Vascular;  Laterality: Right; HPI: Pt  is a 69 y/o M admitted with L MCA CVA s/p TPA. He was intubated 12/11-12/13, reintubated 12/13; trach 12/21 (decannulated 1/5). PMH: tobacco use, previous strokes, GERD, COPD, AAA, PVD, HTN, HLD, pulmonary nodule, depression, afib (recently missed doses of coumadin). MBS completed 02/21/19 mild to moderate oropharyngeal dysphagia,  rec. Dys 1, honey thickened liquids.  Subjective: Pt seen in radilogy for MBS Assessment / Plan / Recommendation CHL IP CLINICAL IMPRESSIONS 03/02/2019 Clinical Impression Pt seen for repeat MBS to determine appropriateness for advanced diet. Pt has been tolerating puree and honey thick liquids since MBS 02/21/19, and has been making good progress in therapy. Pt presents with improved swallow function vs prior MBS. Orally, pt exhibits extended oral prep of solids, likely due to being edentulous. Liquids, puree, and barium tablet were cleared from the oral cavity without difficulty, delay, or residue. Pharyngeally, pt exhibits delayed swallow reflex, with trigger at the pyriform sinus on thin and nectar thick liquids, and at the vallecular sinus on puree, solid, and barium tablet given with liquid. No post-swallow residue was seen on any texture. Trace penetration was seen intermittently on thin and nectar  thick liquids, moreso with larger boluses. No aspiration was seen on any consistency.  Recommend advancing to dys 2/thin with meds given whole, one at a time. Anticipate being able to advance solids at bedside as strength and endurance improve. Safe swallow precautions were sent with pt back to room and MD/RN were notified. SLP will continue to follow pt acutely for diet tolerance/advancement and language treatment.  SLP Visit Diagnosis Dysphagia, oropharyngeal phase (R13.12)     Impact on safety and function Mild aspiration risk   CHL IP TREATMENT RECOMMENDATION 03/02/2019 Treatment Recommendations Therapy as outlined in treatment plan below   Prognosis 03/02/2019 Prognosis for Safe Diet  Advancement Good Barriers to Reach Goals Language deficits   CHL IP DIET RECOMMENDATION 03/02/2019 SLP Diet Recommendations Dysphagia 2 (Fine chop) solids;Thin liquid Liquid Administration via Cup;Straw Medication Administration Whole meds with puree Compensations Minimize environmental distractions;Slow rate;Small sips/bites Postural Changes Seated upright at 90 degrees   CHL IP OTHER RECOMMENDATIONS 03/02/2019   Oral Care Recommendations Oral care BID     CHL IP FOLLOW UP RECOMMENDATIONS 03/02/2019 Follow up Recommendations Skilled Nursing facility   [facility] IP FREQUENCY AND DURATION 03/02/2019 Speech Therapy Frequency (ACUTE ONLY) min 2x/week Treatment Duration 2 weeks      CHL IP ORAL PHASE 03/02/2019 Oral Phase Impaired   Oral - Honey Cup WFL   Oral - Nectar Cup WFL   Oral - Thin Cup WFL   Oral - Puree WFL Oral - Mech Soft Delayed oral transit - likely due to missing dentition.   Oral - Pill WFL    CHL IP PHARYNGEAL PHASE 03/02/2019 Pharyngeal Phase Impaired   Pharyngeal- Nectar Cup Delayed swallow initiation-pyriform sinuses;Penetration/Aspiration during swallow Pharyngeal Material does not enter airway;Material enters airway, remains ABOVE vocal cords then ejected out   Pharyngeal- Thin Teaspoon Delayed swallow initiation-pyriform sinuses;Penetration/Aspiration during swallow Pharyngeal Material does not enter airway;Material enters airway, remains ABOVE vocal cords then ejected out Pharyngeal- Puree Delayed swallow initiation-vallecula   Pharyngeal- Mechanical Soft Delayed swallow initiation-vallecula   Pharyngeal- Pill WFL      CHL IP CERVICAL ESOPHAGEAL PHASE 03/02/2019 Cervical Esophageal Phase [facility] Celia B. Quentin Ore, [facility], [place] Speech Language Pathologist Office: [phone] Pager: [phone] Shonna Chock 03/02/2019, 1:08 PM              DG Swallowing Func-Speech Pathology  Result Date: 02/21/2019 Objective Swallowing Evaluation: Type of Study: MBS-Modified Barium Swallow Study  Patient Details Name:  IKEEM SCHOENIG MRN: [id number] Date of Birth: 11/15/1950 Today's Date: 02/21/2019 Time: SLP Start Time (ACUTE ONLY): 0808 -SLP Stop Time (ACUTE ONLY): 0830 SLP Time Calculation (min) (ACUTE ONLY): 22 min Past Medical History: Past Medical History: Diagnosis Date . AAA (abdominal aortic aneurysm) ([place])   per last CT 03-26-2016---  3.9cm . Anticoagulated on Coumadin  . Arthritis of spine  . Benign localized prostatic hyperplasia with lower urinary tract symptoms (LUTS)  . Chronic atrial fibrillation ([place]) cardiolgoist -- dr Stanford Breed  dx 2009 . Complication of anesthesia   per pt today (07-08-2016) had problem after having anesthesia 12/ 2016 surgery at [facility] "not good" went to ED ( in epic ED visit 01/ 2017 dx severe episode of major depression disorder and admitted to [facility] . COPD with emphysema ([place])  . Dyspnea on exertion  . Full dentures  . GERD (gastroesophageal reflux disease)  . Hematuria  . History of amaurosis fugax   right eye 02-08-2015 -- resolved /  documented in epic possible left eye amaurosis fugax 11/ 2008 (pt denies) /  per last MRI show previous bilateral occipital lobe infarcts . History of concussion   as child . History of CVA (cerebrovascular accident) 10/ 2016  and per pt Dec 2017 in [place]  neurologist-  dr Leonie Man-- per note silent infarcts on MRI-- multiple acute infarcts bilateral cerebullm, right temporal lobe, and bilateral occipital lobes due to embolic phenomena (atrial fib.) . History of seizures as a child  . Hyperlipidemia  . Hypertension  . Left arm numbness   occasional numbess post residual axilla arterial occlusion and surgery . Major depressive disorder   hx severe episode without psychosis 02/2015 w/ homicidal ideation . Narcotic dependence ([place])  . PAD (peripheral artery disease) ([place])   followed by dr fields-- s/p  left axilla to branchial and left axilla to ulnar bypass graft due to ischemic hand . Pulmonary nodule   per CT 03-26-2016  right lower lobe . PVD (peripheral  vascular disease) ([place])  . Right ureteral stone  . Senile purpura ([place])  . Weakness of left arm   residual from axilla arterial occlusion and post surgery . Wears glasses  Past Surgical History: Past Surgical History: Procedure Laterality Date . ARCH AORTOGRAM N/A 04/05/2014  Procedure: ARCH AORTOGRAM, FIRST ORDER CATHETERIZATION LEFT SUBCLAVIAN ARTERY;  Surgeon: Elam Dutch, MD;  Location: [facility] OR;  Service: Vascular;  Laterality: N/A; . AXILLARY-FEMORAL BYPASS GRAFT Left 04/08/2014  Procedure: LEFT AXILLARY ARTERY TO BRACHIAL ARTERY BYPASS USING NON REVERSE LEFT GREATER SAPHENOUS VEIN ,LIGATION OF LEFT AXILLARY ARTERY ANEURYSM;  Surgeon: Elam Dutch, MD;  Location: [place];  Service: Vascular;  Laterality: Left; . BYPASS AXILLA/BRACHIAL ARTERY Left 01/27/2015  Procedure: LEFT BRACHIAL-ULAR ARTERY BYPASS USING GREATER SAPHENOUS VEIN;  Surgeon: Elam Dutch, MD;  Location: [place];  Service: Vascular;  Laterality: Left; . CYSTOSCOPY WITH RETROGRADE PYELOGRAM, URETEROSCOPY AND STENT PLACEMENT Right 07/17/2016  Procedure: CYSTOSCOPY WITH RETROGRADE PYELOGRAM, URETEROSCOPY AND STENT PLACEMENT,LASER;  Surgeon: Festus Aloe, MD;  Location: [facility];  Service: Urology;  Laterality: Right; . CYSTOSCOPY/URETEROSCOPY/HOLMIUM LASER/STENT PLACEMENT Right 07/13/2016  Procedure: CYSTOSCOPY, RIGHT URETEROSCOPY RETROGRADE PYELOGRAM;  Surgeon: Kathie Rhodes, MD;  Location: [facility];  Service: Urology;  Laterality: Right; . EMBOLECTOMY Left 04/05/2014  Procedure: THROMBO ENDARTERECTOMY OF LEFT BRACHIAL, RADIAL AND ULNAR ARTERY,  Left Radial artery cut down and radial artery thrombectomy.;  Surgeon: Elam Dutch, MD;  Location: [facility] OR;  Service: Vascular;  Laterality: Left; . ESOPHAGOGASTRODUODENOSCOPY N/A 01/30/2019  Procedure: ESOPHAGOGASTRODUODENOSCOPY (EGD);  Surgeon: Jesusita Oka, MD;  Location: [facility] ENDOSCOPY;  Service: General;  Laterality: N/A; . facial cyst Right   cyst removal.  . IR REPLC  GASTRO/COLONIC TUBE PERCUT W/FLUORO  02/17/2019 . PATCH ANGIOPLASTY Left 04/05/2014  Procedure: LEFT ARM VEIN PATCH ANGIOPLASTY;  Surgeon: Elam Dutch, MD;  Location: [place];  Service: Vascular;  Laterality: Left; . PEG PLACEMENT N/A 01/30/2019  Procedure: PERCUTANEOUS ENDOSCOPIC GASTROSTOMY (PEG) PLACEMENT;  Surgeon: Jesusita Oka, MD;  Location: [place];  Service: General;  Laterality: N/A; . PERIPHERAL VASCULAR CATHETERIZATION N/A 01/22/2015  Procedure: Aortic Arch Angiography;  Surgeon: Elam Dutch, MD;  Location: [place] [facility];  Service: Cardiovascular;  Laterality: N/A; . THROMBECTOMY BRACHIAL ARTERY Left 11/20/2014  Procedure: 1.  Thrombectomy Left Axilo-Brachial Bypass  2.  Thromboendarterecotmy of Left Brachial Artery with Fogarty Thrombectomy of Radial and Ulnar Arteries with Dacron patch angioplasty Left Brachial Artery. 3. Intraoperative  Arteriogram times four.;  Surgeon: Mal Misty, MD;  Location: [place];  Service: Vascular;  Laterality: Left; .  TRANSTHORACIC ECHOCARDIOGRAM  11/25/2014  moderate concentric LVH, ef 60-65%, unable to evaluation LVDF due to atrial fib/  mild MR/  severe LAE . VEIN HARVEST Left 04/08/2014  Procedure: LEFT GREATER SAPHENOUS VEIN HARVEST;  Surgeon: Elam Dutch, MD;  Location: [place];  Service: Vascular;  Laterality: Left; Marland Kitchen VEIN HARVEST Right 01/27/2015  Procedure: RIGHT GREATER SAPHENOUS VEIN HARVEST;  Surgeon: Elam Dutch, MD;  Location: [place];  Service: Vascular;  Laterality: Right; HPI: Pt is a 69 y/o M admitted with L MCA CVA s/p TPA. He was intubated 12/11-12/13, reintubated 12/13; trach 12/21 (decannulated 1/5). PMH: tobacco use, previous strokes, GERD, COPD, AAA, PVD, HTN, HLD, pulmonary nodule, depression, afib (recently missed doses of coumadin)  Subjective: pt alert, aphasic Assessment / Plan / Recommendation CHL IP CLINICAL IMPRESSIONS 02/21/2019 Clinical Impression Pt presents with a mild-moderate oropharyngeal dysphagia. He has  delayed oral transit and mild oral residuals. He has impaired timing or swallow initiation as well as reduced hyolaryngeal movement, epiglottic inversion, and LVC, although his pharyngeal squeeze is good. This results in trace to no residue in the pharynx but he does have frequent penetration of thin and nectar thick liquids. He is not able to clear this penetration and only intermittent coughs to command. As the penetrates remain, they eventually fall below the vocal folds and are silently aspirated. When challenged with larger boluses he has more immediate silent aspiration. Recommend Dys 1 diet and honey thick liquids for now with additional SLP f/u and potential pharyngeal exercises as cognitive-linguistic status allows.  SLP Visit Diagnosis Dysphagia, oropharyngeal phase (R13.12) Attention and concentration deficit following -- Frontal lobe and executive function deficit following -- Impact on safety and function Moderate aspiration risk   [facility] IP TREATMENT RECOMMENDATION 02/21/2019 Treatment Recommendations Therapy as outlined in treatment plan below   Prognosis 02/21/2019 Prognosis for Safe Diet Advancement Good Barriers to Reach Goals Language deficits Barriers/Prognosis Comment -- [facility] IP DIET RECOMMENDATION 02/21/2019 SLP Diet Recommendations Dysphagia 1 (Puree) solids;Honey thick liquids Liquid Administration via Cup;Straw Medication Administration Via alternative means Compensations Minimize environmental distractions;Slow rate;Small sips/bites;Lingual sweep for clearance of pocketing Postural Changes Seated upright at 90 degrees   CH[facility] IP OTHER RECOMMENDATIONS 02/21/2019 Recommended Consults -- Oral Care Recommendations Oral care BID Other Recommendations --   [facility] IP FOLLOW UP RECOMMENDATIONS 02/21/2019 Follow up Recommendations Skilled Nursing facility   [facility] IP FREQUENCY AND DURATION 02/21/2019 Speech Therapy Frequency (ACUTE ONLY) min 2x/week Treatment Duration 2 weeks      CHL IP ORAL PHASE 02/21/2019 Oral  Phase Impaired Oral - Pudding Teaspoon -- Oral - Pudding Cup -- Oral - Honey Teaspoon -- Oral - Honey Cup Delayed oral transit;Lingual/palatal residue Oral - Nectar Teaspoon -- Oral - Nectar Cup Delayed oral transit;Lingual/palatal residue Oral - Nectar Straw Delayed oral transit;Lingual/palatal residue Oral - Thin Teaspoon Delayed oral transit;Lingual/palatal residue Oral - Thin Cup Delayed oral transit;Lingual/palatal residue Oral - Thin Straw Delayed oral transit;Lingual/palatal residue Oral - Puree Delayed oral transit;Lingual/palatal residue Oral - Mech Soft -- Oral - Regular -- Oral - Multi-Consistency -- Oral - Pill -- Oral Phase - Comment Delayed oral transit;Lingual/palatal residue  CHL IP PHARYNGEAL PHASE 02/21/2019 Pharyngeal Phase Impaired Pharyngeal- Pudding Teaspoon -- Pharyngeal -- Pharyngeal- Pudding Cup -- Pharyngeal -- Pharyngeal- Honey Teaspoon -- Pharyngeal -- Pharyngeal- Honey Cup Reduced epiglottic inversion;Reduced anterior laryngeal mobility;Reduced laryngeal elevation;Reduced airway/laryngeal closure Pharyngeal -- Pharyngeal- Nectar Teaspoon -- Pharyngeal -- Pharyngeal- Nectar Cup Reduced epiglottic inversion;Reduced anterior laryngeal mobility;Reduced laryngeal elevation;Reduced airway/laryngeal closure;Penetration/Aspiration during swallow  Pharyngeal Material enters airway, passes BELOW cords without attempt by patient to eject out (silent aspiration) Pharyngeal- Nectar Straw Reduced epiglottic inversion;Reduced anterior laryngeal mobility;Reduced laryngeal elevation;Reduced airway/laryngeal closure;Penetration/Aspiration during swallow Pharyngeal Material enters airway, passes BELOW cords without attempt by patient to eject out (silent aspiration) Pharyngeal- Thin Teaspoon Reduced epiglottic inversion;Reduced anterior laryngeal mobility;Reduced laryngeal elevation;Reduced airway/laryngeal closure;Penetration/Aspiration during swallow Pharyngeal Material enters airway, remains ABOVE vocal  cords and not ejected out Pharyngeal- Thin Cup Reduced epiglottic inversion;Reduced anterior laryngeal mobility;Reduced laryngeal elevation;Reduced airway/laryngeal closure;Penetration/Aspiration during swallow Pharyngeal Material enters airway, passes BELOW cords without attempt by patient to eject out (silent aspiration) Pharyngeal- Thin Straw Reduced epiglottic inversion;Reduced anterior laryngeal mobility;Reduced laryngeal elevation;Reduced airway/laryngeal closure;Penetration/Aspiration during swallow Pharyngeal Material enters airway, passes BELOW cords without attempt by patient to eject out (silent aspiration) Pharyngeal- Puree -- Pharyngeal -- Pharyngeal- Mechanical Soft -- Pharyngeal -- Pharyngeal- Regular -- Pharyngeal -- Pharyngeal- Multi-consistency -- Pharyngeal -- Pharyngeal- Pill -- Pharyngeal -- Pharyngeal Comment --  CHL IP CERVICAL ESOPHAGEAL PHASE 02/21/2019 Cervical Esophageal Phase WFL Pudding Teaspoon -- Pudding Cup -- Honey Teaspoon -- Honey Cup -- Nectar Teaspoon -- Nectar Cup -- Nectar Straw -- Thin Teaspoon -- Thin Cup -- Thin Straw -- Puree -- Mechanical Soft -- Regular -- Multi-consistency -- Pill -- Cervical Esophageal Comment -- Osie Bond., M.A. CCC-SLP Acute Rehabilitation Services Pager [phone] Office [phone] 02/21/2019, 9:17 AM              VAS [place] ABI WITH/WO TBI  Result Date: 02/27/2019 LOWER EXTREMITY DOPPLER STUDY Indications: Peripheral artery disease, and black right great toe. High Risk Factors: Hypertension, hyperlipidemia, current smoker. Other Factors: AAA.  Vascular Interventions: Left arm bypass graft. Comparison       Left upper extremity arterial duplex done today, total Study:           occlusion. Performing Technologist: Baldwin Crown RVT, RDMS  Examination Guidelines: A complete evaluation includes at minimum, Doppler waveform signals and systolic blood pressure reading at the level of bilateral brachial, anterior tibial, and posterior tibial  arteries, when vessel segments are accessible. Bilateral testing is considered an integral part of a complete examination. Photoelectric Plethysmograph (PPG) waveforms and toe systolic pressure readings are included as required and additional duplex testing as needed. Limited examinations for reoccurring indications may be performed as noted.  ABI Findings: +---------+------------------+-----+---------+--------+ Right    Rt Pressure (mmHg)IndexWaveform Comment  +---------+------------------+-----+---------+--------+ Brachial 138                    triphasic         +---------+------------------+-----+---------+--------+ PTA      157               1.14 triphasic         +---------+------------------+-----+---------+--------+ DP       159               1.15 biphasic          +---------+------------------+-----+---------+--------+ Great Toe0                 0.00 Absent            +---------+------------------+-----+---------+--------+ +---------+------------------+-----+--------+----------------------------------+ Left     Lt Pressure (mmHg)IndexWaveformComment                            +---------+------------------+-----+--------+----------------------------------+ Brachial  Occlusion seen on prior study done                                         today.                             +---------+------------------+-----+--------+----------------------------------+ PTA      152               1.10 biphasic                                   +---------+------------------+-----+--------+----------------------------------+ DP       166               1.20 biphasic                                   +---------+------------------+-----+--------+----------------------------------+ Great Toe119               0.86 Abnormal                                   +---------+------------------+-----+--------+----------------------------------+  +-------+-----------+-----------+------------+------------+ ABI/TBIToday's ABIToday's TBIPrevious ABIPrevious TBI +-------+-----------+-----------+------------+------------+ Right  1.15                                           +-------+-----------+-----------+------------+------------+ Left   1.20                                           +-------+-----------+-----------+------------+------------+  Summary: Right: Resting right ankle-brachial index is within normal range. No evidence of significant right lower extremity arterial disease. The right toe-brachial index is abnormal. Left: Resting left ankle-brachial index is within normal range. No evidence of significant left lower extremity arterial disease.  *See table(s) above for measurements and observations.  Electronically signed by Deitra Mayo MD on 02/27/2019 at 12:52:17 PM.    Final    VAS [place] UPPER EXTREMITY ARTERIAL DUPLEX  Result Date: 02/27/2019 UPPER EXTREMITY DUPLEX STUDY Indications: Patient complains of arm pain. History:     Patient has a history ofembolic stroke.  Risk Factors:  Hypertension, Diabetes. Other Factors: Atrial fibrilltion, on Coumadin. Limitations: contstant movement of arm as well as sitting up and laying back              down during entire study. Comparison Study: Prior upper extremity arterial duplex done12/20/20 Performing Technologist: Sharion Dove RVS  Examination Guidelines: A complete evaluation includes B-mode imaging, spectral Doppler, color Doppler, and power Doppler as needed of all accessible portions of each vessel. Bilateral testing is considered an integral part of a complete examination. Limited examinations for reoccurring indications may be performed as noted.  Left Doppler Findings: +---------------+----------+--------+--------+------------------------------+ Site           PSV (cm/s)WaveformStenosisComments                        +---------------+----------+--------+--------+------------------------------+ Subclavian Dist  occluded                               +---------------+----------+--------+--------+------------------------------+ Axillary                         occluded                               +---------------+----------+--------+--------+------------------------------+ Brachial Prox                    occluded                               +---------------+----------+--------+--------+------------------------------+ Brachial Mid                     occluded                               +---------------+----------+--------+--------+------------------------------+ Brachial Dist                    occluded                               +---------------+----------+--------+--------+------------------------------+ Radial Prox                      occluded                               +---------------+----------+--------+--------+------------------------------+ Radial Mid                       occluded                               +---------------+----------+--------+--------+------------------------------+ Radial Dist                      occluded                               +---------------+----------+--------+--------+------------------------------+ Ulnar Prox                       occluded                               +---------------+----------+--------+--------+------------------------------+ Ulnar Mid                        occluded                               +---------------+----------+--------+--------+------------------------------+ Ulnar Dist                       occluded                               +---------------+----------+--------+--------+------------------------------+ Palmar Arch  No arterial flow noted in palm +---------------+----------+--------+--------+------------------------------+    Summary:  Left: Total occlusion visualized in the left upper extremity.       Obstruction noted in the subclavian artery, axillary artery,       brachial artery, radial artery, ulnar artery and palmar arch.       The left axillary and brachial arteries remain occluded since       prior study done       01/28/19. The subclavian, ulnar, and radial arteries now       appear occluded with no arterial flow noted in the left palm. *See table(s) above for measurements and observations. Electronically signed by Deitra Mayo MD on 02/27/2019 at 12:54:49 PM.    Final     (Echo, Carotid, EGD, Colonoscopy, ERCP)  Procedure: 2D Echo  Indications:    Stroke 434.91/I163.9   History:        Patient has prior history of Echocardiogram examinations, most                 recent 11/25/2014. COPD, Arrythmias:Atrial Fibrillation; Risk                 Factors:Hypertension.   Sonographer:    Clayton Lefort RDCS (AE) Referring Phys: [id number] MCNEILL P [facility]    Sonographer Comments: Technically challenging study due to limited acoustic windows, Technically difficult study due to poor echo windows, suboptimal parasternal window, suboptimal apical window, suboptimal subcostal window and echo performed with  patient supine and on artificial respirator. Image acquisition challenging due to chest wall deformity. All images taken subcostally. No measurements made in 4-2-3 chambers views due to images being significantly off-axis. IMPRESSIONS    1. Left ventricular ejection fraction, by visual estimation, is 55 to 60%. The left ventricle has normal function. There is moderately increased left ventricular hypertrophy.  2. The left ventricle demonstrates regional wall motion abnormalities.  3. Global right ventricle has normal systolic function.The right ventricular size is normal. No increase in right ventricular wall thickness.  4. Left atrial size was normal.  5. Right atrial size was mildly dilated.  6. The  mitral valve is normal in structure. Mild to moderate mitral valve regurgitation.  7. The tricuspid valve is normal in structure. Tricuspid valve regurgitation is trivial.  8. The aortic valve is normal in structure. Aortic valve regurgitation is not visualized.  9. The pulmonic valve was normal in structure. Pulmonic valve regurgitation is not visualized. 10. The atrial septum is grossly normal.  UPPER EXTREMITY DUPLEX STUDY  Indications: Patient complains of cold hand.  Other Factors: CVA, subtherapeutic INR, Atrial fibrillation.  Limitations: Patient ventilation, arm positioning, and constant arm movement.  Comparison Study: No prior study on file for comparison  Performing Technologist: Sharion Dove RVS    Examination Guidelines: A complete evaluation includes B-mode imaging, spectral Doppler, color Doppler, and power Doppler as needed of all accessible portions of each vessel. Bilateral testing is considered an integral part of a complete examination. Limited examinations for reoccurring indications may be performed as noted.    Left Doppler Findings: +-----------+----------+-------------------+------+-------------------+ Site       PSV (cm/s)Waveform           PlaqueComments            +-----------+----------+-------------------+------+-------------------+ Subclavian           triphasic                patent              +-----------+----------+-------------------+------+-------------------+  Axillary                                      thrombus            +-----------+----------+-------------------+------+-------------------+ Brachial                                      thrombus            +-----------+----------+-------------------+------+-------------------+ Radial     22        dampened monophasic                          +-----------+----------+-------------------+------+-------------------+ Ulnar                                          could not visualize +-----------+----------+-------------------+------+-------------------+ Palmar Arch                                   could not visualize +-----------+----------+-------------------+------+-------------------+    Summary:   Left: There appears to be arterial thrombus noted in the left       axillary and brachial arteries. There are dampened waveforms       noted in the proximal radial artery. Unable to visualize ulnar       artery, mid to distal radial artery, or palmar arch secondary       to patient's positioning and constant movement of arm.       Incidentally, there is DVT noted in the left brachial and       axillary veins and acute SVT noted in the left cephalic and       basilic veins.  UPPER EXTREMITY DUPLEX STUDY  Indications: Patient complains of arm pain. History:     Patient has a history ofembolic stroke.  Risk Factors:  Hypertension, Diabetes. Other Factors: Atrial fibrilltion, on Coumadin.  Limitations: contstant movement of arm as well as sitting up and laying back              down during entire study.  Comparison Study: Prior upper extremity arterial duplex done12/20/20  Performing Technologist: Sharion Dove RVS    Examination Guidelines: A complete evaluation includes B-mode imaging, spectral Doppler, color Doppler, and power Doppler as needed of all accessible portions of each vessel. Bilateral testing is considered an integral part of a complete examination. Limited examinations for reoccurring indications may be performed as noted.    Left Doppler Findings: +---------------+----------+--------+--------+------------------------------+ Site           PSV (cm/s)WaveformStenosisComments                       +---------------+----------+--------+--------+------------------------------+ Subclavian Dist                  occluded                                +---------------+----------+--------+--------+------------------------------+ Axillary                         occluded                               +---------------+----------+--------+--------+------------------------------+  Brachial Prox                    occluded                               +---------------+----------+--------+--------+------------------------------+ Brachial Mid                     occluded                               +---------------+----------+--------+--------+------------------------------+ Brachial Dist                    occluded                               +---------------+----------+--------+--------+------------------------------+ Radial Prox                      occluded                               +---------------+----------+--------+--------+------------------------------+ Radial Mid                       occluded                               +---------------+----------+--------+--------+------------------------------+ Radial Dist                      occluded                               +---------------+----------+--------+--------+------------------------------+ Ulnar Prox                       occluded                               +---------------+----------+--------+--------+------------------------------+ Ulnar Mid                        occluded                               +---------------+----------+--------+--------+------------------------------+ Ulnar Dist                       occluded                               +---------------+----------+--------+--------+------------------------------+ Palmar Arch                              No arterial flow noted in palm +---------------+----------+--------+--------+------------------------------+       Summary:   Left: Total occlusion visualized in the left upper extremity.       Obstruction noted in the subclavian  artery, axillary artery,       brachial artery, radial artery, ulnar artery and palmar arch.       The left axillary and brachial arteries remain occluded since  prior study done       01/28/19. The subclavian, ulnar, and radial arteries now       appear occluded with no arterial flow noted in the left palm.  LOWER EXTREMITY DOPPLER STUDY  Indications: Peripheral artery disease, and black right great toe.  High Risk Factors: Hypertension, hyperlipidemia, current smoker.  Other Factors: AAA.  Vascular Interventions: Left arm bypass graft.  Comparison       Left upper extremity arterial duplex done today, total Study:           occlusion.  Performing Technologist: Baldwin Crown RVT, RDMS    Examination Guidelines: A complete evaluation includes at minimum, Doppler waveform signals and systolic blood pressure reading at the level of bilateral brachial, anterior tibial, and posterior tibial arteries, when vessel segments are accessible. Bilateral testing is considered an integral part of a complete examination. Photoelectric Plethysmograph (PPG) waveforms and toe systolic pressure readings are included as required and additional duplex testing as needed. Limited examinations for reoccurring indications may be performed as noted.    ABI Findings: +---------+------------------+-----+---------+--------+ Right    Rt Pressure (mmHg)IndexWaveform Comment  +---------+------------------+-----+---------+--------+ Brachial 138                    triphasic         +---------+------------------+-----+---------+--------+ PTA      157               1.14 triphasic         +---------+------------------+-----+---------+--------+ DP       159               1.15 biphasic          +---------+------------------+-----+---------+--------+ Great Toe0                 0.00 Absent             +---------+------------------+-----+---------+--------+  +---------+------------------+-----+--------+----------------------------------+ Left     Lt Pressure (mmHg)IndexWaveformComment                            +---------+------------------+-----+--------+----------------------------------+ Brachial                                Occlusion seen on prior study done                                         today.                             +---------+------------------+-----+--------+----------------------------------+ PTA      152               1.10 biphasic                                   +---------+------------------+-----+--------+----------------------------------+ DP       166               1.20 biphasic                                   +---------+------------------+-----+--------+----------------------------------+ Audrie Lia  0.86 Abnormal                                   +---------+------------------+-----+--------+----------------------------------+  +-------+-----------+-----------+------------+------------+ ABI/TBIToday's ABIToday's TBIPrevious ABIPrevious TBI +-------+-----------+-----------+------------+------------+ Right  1.15                                           +-------+-----------+-----------+------------+------------+ Left   1.20                                           +-------+-----------+-----------+------------+------------+      Summary: Right: Resting right ankle-brachial index is within normal range. No evidence of significant right lower extremity arterial disease. The right toe-brachial index is abnormal.  Left: Resting left ankle-brachial index is within normal range. No evidence of significant left lower extremity arterial disease.  Subjective: Patient continues to have expressive dysphasia although able to answer yes/no questions without difficulty. He denies any CP,  SOB, N/V/D, fever, chills.   Discharge Exam: Vitals:   03/03/19 0357 03/03/19 0807  BP: (!) 125/91 (!) 151/137  Pulse: 70   Resp: 17 20  Temp: 97.8 F (36.6 C) 97.8 F (36.6 C)  SpO2: 100%    Vitals:   03/02/19 2030 03/02/19 2325 03/03/19 0357 03/03/19 0807  BP: 120/71 105/78 (!) 125/91 (!) 151/137  Pulse: 78 74 70   Resp: 16 19 17 20   Temp: 98.2 F (36.8 C) 97.8 F (36.6 C) 97.8 F (36.6 C) 97.8 F (36.6 C)  TempSrc: Oral Axillary Axillary Oral  SpO2: 96% 100% 100%   Weight:      Height:        General: Pt is alert, awake, not in acute distress Cardiovascular: RRR, S1/S2 +, no rubs, no gallops Respiratory: CTA bilaterally, no wheezing, no rhonchi Abdominal: Soft, NT, ND, bowel sounds + Extremities: no edema, no cyanosis Neuro: Moves all 4 extremities equally, expressive dysphasia    The results of significant diagnostics from this hospitalization (including imaging, microbiology, ancillary and laboratory) are listed below for reference.     Microbiology: Recent Results (from the past 240 hour(s))  MRSA PCR Screening     Status: None   Collection Time: 02/26/19  6:58 AM   Specimen: Nasal Mucosa; Nasopharyngeal  Result Value Ref Range Status   MRSA by PCR NEGATIVE NEGATIVE Final    Comment:        The GeneXpert MRSA Assay (FDA approved for NASAL specimens only), is one component of a comprehensive MRSA colonization surveillance program. It is not intended to diagnose MRSA infection nor to guide or monitor treatment for MRSA infections. Performed at [facility], [place] [address] [place], [place] [address]   SARS CORONAVIRUS 2 (TAT 6-24 HRS) Nasopharyngeal Nasopharyngeal Swab     Status: None   Collection Time: 03/02/19  1:12 PM   Specimen: Nasopharyngeal Swab  Result Value Ref Range Status   SARS Coronavirus 2 NEGATIVE NEGATIVE Final    Comment: (NOTE) SARS-CoV-2 target nucleic acids are NOT DETECTED. The SARS-CoV-2 RNA is generally detectable in  upper and lower respiratory specimens during the acute phase of infection. Negative results do not preclude SARS-CoV-2 infection, do not rule out co-infections with other pathogens, and should not be  used as the sole basis for treatment or other patient management decisions. Negative results must be combined with clinical observations, patient history, and epidemiological information. The expected result is Negative. Fact Sheet for Patients: SugarRoll.be Fact Sheet for Healthcare Providers: https://www.woods-mathews.com/ This test is not yet approved or cleared by the [place] FDA and  has been authorized for detection and/or diagnosis of SARS-CoV-2 by FDA under an Emergency Use Authorization (EUA). This EUA will remain  in effect (meaning this test can be used) for the duration of the COVID-19 declaration under Section 56 4(b)(1) of the Act, 21 U.S.C. section 360bbb-3(b)(1), unless the authorization is terminated or revoked sooner. Performed at [facility], [place] [address] [place], [place] [address]      Labs: BNP (last 3 results) No results for input(s): BNP in the last 8760 hours. Basic Metabolic Panel: Recent Labs  Lab 02/27/19 0458 02/28/19 0439 03/01/19 0201  NA 142 139 141  K 4.3 4.0 4.1  CL 106 103 102  CO2 27 27 30   GLUCOSE 82 88 80  BUN 21 17 17   CREATININE 0.80 0.85 0.82  CALCIUM 9.2 9.0 9.2   Liver Function Tests: No results for input(s): AST, ALT, ALKPHOS, BILITOT, PROT, ALBUMIN in the last 168 hours. No results for input(s): LIPASE, AMYLASE in the last 168 hours. No results for input(s): AMMONIA in the last 168 hours. CBC: Recent Labs  Lab 02/28/19 0439  WBC 10.2  HGB 13.8  HCT 42.5  MCV 103.7*  PLT 308   Cardiac Enzymes: No results for input(s): CKTOTAL, CKMB, CKMBINDEX, TROPONINI in the last 168 hours. BNP: Invalid input(s): POCBNP CBG: No results for input(s): GLUCAP in the last 168  hours. D-Dimer No results for input(s): DDIMER in the last 72 hours. Hgb A1c No results for input(s): HGBA1C in the last 72 hours. Lipid Profile No results for input(s): CHOL, HDL, LDLCALC, TRIG, CHOLHDL, LDLDIRECT in the last 72 hours. Thyroid function studies No results for input(s): TSH, T4TOTAL, T3FREE, THYROIDAB in the last 72 hours.  Invalid input(s): FREET3 Anemia work up No results for input(s): VITAMINB12, FOLATE, FERRITIN, TIBC, IRON, RETICCTPCT in the last 72 hours. Urinalysis    Component Value Date/Time   COLORURINE YELLOW 02/16/2019 1345   APPEARANCEUR HAZY (A) 02/16/2019 1345   LABSPEC 1.012 02/16/2019 1345   PHURINE 7.0 02/16/2019 1345   GLUCOSEU NEGATIVE 02/16/2019 1345   GLUCOSEU NEGATIVE 12/13/2012 1514   HGBUR NEGATIVE 02/16/2019 1345   [place] 02/16/2019 1345   KETONESUR NEGATIVE 02/16/2019 1345   PROTEINUR NEGATIVE 02/16/2019 1345   UROBILINOGEN 0.2 12/13/2012 1514   NITRITE POSITIVE (A) 02/16/2019 1345   LEUKOCYTESUR LARGE (A) 02/16/2019 1345   Sepsis Labs Invalid input(s): PROCALCITONIN,  WBC,  LACTICIDVEN Microbiology Recent Results (from the past 240 hour(s))  MRSA PCR Screening     Status: None   Collection Time: 02/26/19  6:58 AM   Specimen: Nasal Mucosa; Nasopharyngeal  Result Value Ref Range Status   MRSA by PCR NEGATIVE NEGATIVE Final    Comment:        The GeneXpert MRSA Assay (FDA approved for NASAL specimens only), is one component of a comprehensive MRSA colonization surveillance program. It is not intended to diagnose MRSA infection nor to guide or monitor treatment for MRSA infections. Performed at [facility], [place] [address] [place], [place] [address]   SARS CORONAVIRUS 2 (TAT 6-24 HRS) Nasopharyngeal Nasopharyngeal Swab     Status: None   Collection Time: 03/02/19  1:12 PM   Specimen: Nasopharyngeal Swab  Result Value Ref Range Status   SARS Coronavirus 2 NEGATIVE NEGATIVE Final    Comment:  (NOTE) SARS-CoV-2 target nucleic acids are NOT DETECTED. The SARS-CoV-2 RNA is generally detectable in upper and lower respiratory specimens during the acute phase of infection. Negative results do not preclude SARS-CoV-2 infection, do not rule out co-infections with other pathogens, and should not be used as the sole basis for treatment or other patient management decisions. Negative results must be combined with clinical observations, patient history, and epidemiological information. The expected result is Negative. Fact Sheet for Patients: SugarRoll.be Fact Sheet for Healthcare Providers: https://www.woods-mathews.com/ This test is not yet approved or cleared by the [place] FDA and  has been authorized for detection and/or diagnosis of SARS-CoV-2 by FDA under an Emergency Use Authorization (EUA). This EUA will remain  in effect (meaning this test can be used) for the duration of the COVID-19 declaration under Section 56 4(b)(1) of the Act, 21 U.S.C. section 360bbb-3(b)(1), unless the authorization is terminated or revoked sooner. Performed at [facility], [place] [address] [place], [place] [address]      Time coordinating discharge: Over 30 minutes  SIGNED:   Arlan Organ, DO  [facility] 03/03/2019, 1:33 PM

## 2019-03-03 NOTE — Progress Notes (Signed)
Physical Therapy Treatment Patient Details Name: [name] MRN: [id number] DOB: [birth year] Today's Date: 03/03/2019    History of Present Illness 74yM admitted 01/20/19 with hypertensive emergency in setting of acute L MCA stroke s/p tPA, AF/RVR.  Has been noncompliant with his warfarin for A. Fib. Intubated for respiratory failure. Trach 01/29/19 with PEG planned for 01/30/19. No sedation at time of PT eval.     PT Comments    Patient seen for mobility progression. Continue to progress as tolerated with anticipated d/c to SNF for further skilled PT services.     Follow Up Recommendations  SNF;Supervision/Assistance - 24 hour     Equipment Recommendations  Other (comment)(TBA)    Recommendations for Other Services       Precautions / Restrictions Precautions Precautions: Fall    Mobility  Bed Mobility               General bed mobility comments: pt sitting EOB upon arrival and end of session; refused getting into chair  Transfers Overall transfer level: Needs assistance Equipment used: None Transfers: Sit to/from Stand Sit to Stand: Min guard         General transfer comment: min guard for safety; cues for safety as pt attempting to reach down to get hat off of floor  Ambulation/Gait Ambulation/Gait assistance: Min assist;Min guard Gait Distance (Feet): 250 Feet Assistive device: None Gait Pattern/deviations: Step-through pattern Gait velocity: decr   General Gait Details: assist to steady especially with horizontal head turns and directional changes; pt unable to answer question and ambulate at the same time   Stairs             Wheelchair Mobility    Modified Rankin (Stroke Patients Only) Modified Rankin (Stroke Patients Only) Pre-Morbid Rankin Score: No symptoms Modified Rankin: Moderately severe disability     Balance Overall balance assessment: Needs assistance Sitting-balance support: Feet supported Sitting balance-Leahy  Scale: Fair Sitting balance - Comments: Min guard for safety   Standing balance support: During functional activity Standing balance-Leahy Scale: Fair                              Cognition Arousal/Alertness: Awake/alert Behavior During Therapy: Flat affect Overall Cognitive Status: Impaired/Different from baseline Area of Impairment: Orientation;Attention;Following commands;Problem solving;Safety/judgement;Memory                 Orientation Level: Disoriented to;Situation;Time Current Attention Level: Sustained Memory: Decreased short-term memory Following Commands: Follows one step commands consistently;Follows one step commands with increased time Safety/Judgement: Decreased awareness of safety;Decreased awareness of deficits Awareness: Intellectual Problem Solving: Slow processing;Requires verbal cues;Requires tactile cues General Comments: mostly unintelligible speech; pt upset about something end of session but very difficult to understand the reason; pt attempting to write it down but unable to hold pen in R hand      Exercises      General Comments General comments (skin integrity, edema, etc.): pt able to don L sock but not R; appears to have poor proprioception when reaching for objects       Pertinent Vitals/Pain Pain Assessment: No/denies pain    Home Living                      Prior Function            PT Goals (current goals can now be found in the care plan section) Progress towards PT goals: Progressing toward goals  Frequency    Min 3X/week      PT Plan Current plan remains appropriate    Co-evaluation              AM-PAC PT "6 Clicks" Mobility   Outcome Measure  Help needed turning from your back to your side while in a flat bed without using bedrails?: None Help needed moving from lying on your back to sitting on the side of a flat bed without using bedrails?: None Help needed moving to and from a bed  to a chair (including a wheelchair)?: A Little Help needed standing up from a chair using your arms (e.g., wheelchair or bedside chair)?: A Little Help needed to walk in hospital room?: A Little Help needed climbing 3-5 steps with a railing? : A Lot 6 Click Score: 19    End of Session Equipment Utilized During Treatment: Gait belt Activity Tolerance: Patient tolerated treatment well Patient left: with call bell/phone within reach;in bed;with bed alarm set;with family/visitor present Nurse Communication: Mobility status PT Visit Diagnosis: Hemiplegia and hemiparesis;Other abnormalities of gait and mobility (R26.89);Other symptoms and signs involving the nervous system (R29.898) Hemiplegia - Right/Left: Right Hemiplegia - dominant/non-dominant: Dominant Hemiplegia - caused by: Cerebral infarction     Time: [id number] PT Time Calculation (min) (ACUTE ONLY): 44 min  Charges:  $Gait Training: 23-37 mins $Therapeutic Activity: 8-22 mins                     Earney Navy, PTA Acute Rehabilitation Services Pager: [phone] Office: [phone]     [name] 03/03/2019, 10:41 AM

## 2019-03-03 NOTE — Progress Notes (Signed)
Roommate visit has to be asked to leave my Security early upon being loud, brash, and agitated with RN. Upon leaving he threatened RN with firing, pointing finger directly in face. Then began to yell at Animal nutritionist. He was escorted from the building.   1430 Discharge to [facility] via [facility] [place].  Report called to [facility] RN [phone]. Pt. Readied for transport. Vital signs stable. AVS with paperwork and hard copy RX in packet to PTAR. Pt has some agitation about going but is unable due to speech/aphasi issues, to get his thoughts out. Informed him of were he was going. Simmie Davies RN

## 2019-03-03 NOTE — TOC Transition Note (Signed)
Transition of Care [facility]) - CM/SW Discharge Note   Patient Details  Name: [name] MRN: [id number] Date of Birth: [birth year]  [facility] CM/SW Contact:  Oretha Milch, LCSW Phone Number: 03/03/2019, 11:09 AM   Clinical Narrative:   Patient will discharge to: [place] Discharge date: 03/03/2019 Family notified: brother Transport by: Corey Harold  Per MD patient is appropriate for discharge and will discharge to [facility] in room 111. RN, patient, patient's family, and facility have been notified of discharge. Assessment, FL-2, PASRR, and discharge summary sent to facillity. RN was provided with the following number for report: [phone]. PTAR will be used to transfer patient to the facility. CSW will continue to follow for any discharge supports.   Final next level of care: Skilled Nursing Facility Barriers to Discharge: Barriers Resolved   Patient Goals and CMS Choice        Discharge Placement                       Discharge Plan and Services In-house Referral: Clinical Social Work                                   Social Determinants of Health (SDOH) Interventions     Readmission Risk Interventions No flowsheet data found.

## 2019-03-05 DIAGNOSIS — I482 Chronic atrial fibrillation, unspecified: Secondary | ICD-10-CM | POA: Diagnosis not present

## 2019-03-05 DIAGNOSIS — I63432 Cerebral infarction due to embolism of left posterior cerebral artery: Secondary | ICD-10-CM | POA: Diagnosis not present

## 2019-03-05 DIAGNOSIS — I119 Hypertensive heart disease without heart failure: Secondary | ICD-10-CM | POA: Diagnosis not present

## 2019-03-05 DIAGNOSIS — R569 Unspecified convulsions: Secondary | ICD-10-CM | POA: Diagnosis not present

## 2019-03-05 DIAGNOSIS — I70208 Unspecified atherosclerosis of native arteries of extremities, other extremity: Secondary | ICD-10-CM | POA: Diagnosis not present

## 2019-03-10 DIAGNOSIS — I70208 Unspecified atherosclerosis of native arteries of extremities, other extremity: Secondary | ICD-10-CM | POA: Diagnosis not present

## 2019-03-10 DIAGNOSIS — I63432 Cerebral infarction due to embolism of left posterior cerebral artery: Secondary | ICD-10-CM | POA: Diagnosis not present

## 2019-03-10 DIAGNOSIS — I119 Hypertensive heart disease without heart failure: Secondary | ICD-10-CM | POA: Diagnosis not present

## 2019-03-10 DIAGNOSIS — R569 Unspecified convulsions: Secondary | ICD-10-CM | POA: Diagnosis not present

## 2019-03-12 DIAGNOSIS — R569 Unspecified convulsions: Secondary | ICD-10-CM | POA: Diagnosis not present

## 2019-03-12 DIAGNOSIS — I119 Hypertensive heart disease without heart failure: Secondary | ICD-10-CM | POA: Diagnosis not present

## 2019-03-12 DIAGNOSIS — I63432 Cerebral infarction due to embolism of left posterior cerebral artery: Secondary | ICD-10-CM | POA: Diagnosis not present

## 2019-03-12 DIAGNOSIS — I482 Chronic atrial fibrillation, unspecified: Secondary | ICD-10-CM | POA: Diagnosis not present

## 2019-03-19 DIAGNOSIS — I63432 Cerebral infarction due to embolism of left posterior cerebral artery: Secondary | ICD-10-CM | POA: Diagnosis not present

## 2019-03-19 DIAGNOSIS — I482 Chronic atrial fibrillation, unspecified: Secondary | ICD-10-CM | POA: Diagnosis not present

## 2019-03-19 DIAGNOSIS — R569 Unspecified convulsions: Secondary | ICD-10-CM | POA: Diagnosis not present

## 2019-03-19 DIAGNOSIS — I119 Hypertensive heart disease without heart failure: Secondary | ICD-10-CM | POA: Diagnosis not present

## 2019-03-26 DIAGNOSIS — I119 Hypertensive heart disease without heart failure: Secondary | ICD-10-CM | POA: Diagnosis not present

## 2019-03-26 DIAGNOSIS — R569 Unspecified convulsions: Secondary | ICD-10-CM | POA: Diagnosis not present

## 2019-03-26 DIAGNOSIS — I482 Chronic atrial fibrillation, unspecified: Secondary | ICD-10-CM | POA: Diagnosis not present

## 2019-03-26 DIAGNOSIS — I63432 Cerebral infarction due to embolism of left posterior cerebral artery: Secondary | ICD-10-CM | POA: Diagnosis not present

## 2019-03-30 DIAGNOSIS — R569 Unspecified convulsions: Secondary | ICD-10-CM | POA: Diagnosis not present

## 2019-03-30 DIAGNOSIS — H9319 Tinnitus, unspecified ear: Secondary | ICD-10-CM | POA: Diagnosis not present

## 2019-03-30 DIAGNOSIS — I119 Hypertensive heart disease without heart failure: Secondary | ICD-10-CM | POA: Diagnosis not present

## 2019-03-30 DIAGNOSIS — I63432 Cerebral infarction due to embolism of left posterior cerebral artery: Secondary | ICD-10-CM | POA: Diagnosis not present

## 2019-04-02 DIAGNOSIS — I119 Hypertensive heart disease without heart failure: Secondary | ICD-10-CM | POA: Diagnosis not present

## 2019-04-02 DIAGNOSIS — I63432 Cerebral infarction due to embolism of left posterior cerebral artery: Secondary | ICD-10-CM | POA: Diagnosis not present

## 2019-04-02 DIAGNOSIS — I482 Chronic atrial fibrillation, unspecified: Secondary | ICD-10-CM | POA: Diagnosis not present

## 2019-04-02 DIAGNOSIS — G8929 Other chronic pain: Secondary | ICD-10-CM | POA: Diagnosis not present

## 2019-04-05 ENCOUNTER — Ambulatory Visit (INDEPENDENT_AMBULATORY_CARE_PROVIDER_SITE_OTHER): Payer: Medicare Other | Admitting: Physician Assistant

## 2019-04-05 ENCOUNTER — Other Ambulatory Visit: Payer: Self-pay

## 2019-04-05 VITALS — BP 102/67 | HR 68 | Temp 97.0°F | Resp 18 | Wt 162.0 lb

## 2019-04-05 DIAGNOSIS — L97519 Non-pressure chronic ulcer of other part of right foot with unspecified severity: Secondary | ICD-10-CM | POA: Diagnosis not present

## 2019-04-05 DIAGNOSIS — L97509 Non-pressure chronic ulcer of other part of unspecified foot with unspecified severity: Secondary | ICD-10-CM | POA: Insufficient documentation

## 2019-04-05 DIAGNOSIS — I739 Peripheral vascular disease, unspecified: Secondary | ICD-10-CM

## 2019-04-05 NOTE — Progress Notes (Signed)
Office Note     CC:  follow up Requesting Provider:  Shon Baton, MD  HPI: [name] is a 69 y.o. (28-Jun-1950) male who presents for recheck of right great toe ulceration.  At the end of last year he sustained an embolic left-sided CVA leaving him with expressive aphasia.  He continues to be in a skilled nursing facility for ongoing rehab.  Vascular was consulted during hospital admission for right great toe wound.  Given that he had a palpable posterior tibial artery pulse and a normal ABI no further work-up was performed.  Today he presents without nursing facility staff or family member.  He is a very poor historian and unable to come up with words to complete any coherent sentences.  When asked if he has had any pain in his right toe he shakes his head no.  When asked if he thinks his right toe is getting better he shakes his head yes.  Based on chart review he appears to be on Eliquis for atrial fibrillation.  He is also on a statin.  She also be noted that there was an extensive hospital stay requiring trach placement and PEG tube placement.  Today however he is ambulatory without any assistance.   Past Medical History:  Diagnosis Date  . AAA (abdominal aortic aneurysm) ([place])    per last CT 03-26-2016---  3.9cm  . Anticoagulated on Coumadin   . Arthritis of spine   . Benign localized prostatic hyperplasia with lower urinary tract symptoms (LUTS)   . Chronic atrial fibrillation ([place]) cardiolgoist -- dr Stanford Breed   dx 2009  . Complication of anesthesia    per pt today (07-08-2016) had problem after having anesthesia 12/ 2016 surgery at [facility] "not good" went to ED ( in epic ED visit 01/ 2017 dx severe episode of major depression disorder and admitted to [facility]  . COPD with emphysema ([place])   . Dyspnea on exertion   . Full dentures   . GERD (gastroesophageal reflux disease)   . Hematuria   . History of amaurosis fugax    right eye 02-08-2015 -- resolved /  documented in epic possible  left eye amaurosis fugax 11/ 2008 (pt denies) / per last MRI show previous bilateral occipital lobe infarcts  . History of concussion    as child  . History of CVA (cerebrovascular accident) 10/ 2016  and per pt Dec 2017 in [place]   neurologist-  dr Leonie Man-- per note silent infarcts on MRI-- multiple acute infarcts bilateral cerebullm, right temporal lobe, and bilateral occipital lobes due to embolic phenomena (atrial fib.)  . History of seizures as a child   . Hyperlipidemia   . Hypertension   . Left arm numbness    occasional numbess post residual axilla arterial occlusion and surgery  . Major depressive disorder    hx severe episode without psychosis 02/2015 w/ homicidal ideation  . Narcotic dependence ([place])   . PAD (peripheral artery disease) ([place])    followed by dr fields-- s/p  left axilla to branchial and left axilla to ulnar bypass graft due to ischemic hand  . Pulmonary nodule    per CT 03-26-2016  right lower lobe  . PVD (peripheral vascular disease) ([place])   . Right ureteral stone   . Senile purpura ([place])   . Weakness of left arm    residual from axilla arterial occlusion and post surgery  . Wears glasses     Past Surgical History:  Procedure Laterality Date  .  ARCH AORTOGRAM N/A 04/05/2014   Procedure: ARCH AORTOGRAM, FIRST ORDER CATHETERIZATION LEFT SUBCLAVIAN ARTERY;  Surgeon: Elam Dutch, MD;  [facility];  Service: Vascular;  Laterality: N/A;  . AXILLARY-FEMORAL BYPASS GRAFT Left 04/08/2014   Procedure: LEFT AXILLARY ARTERY TO BRACHIAL ARTERY BYPASS USING NON REVERSE LEFT GREATER SAPHENOUS VEIN ,LIGATION OF LEFT AXILLARY ARTERY ANEURYSM;  Surgeon: Elam Dutch, MD;  Location: [place];  Service: Vascular;  Laterality: Left;  . BYPASS AXILLA/BRACHIAL ARTERY Left 01/27/2015   Procedure: LEFT BRACHIAL-ULAR ARTERY BYPASS USING GREATER SAPHENOUS VEIN;  Surgeon: Elam Dutch, MD;  Location: [place];  Service: Vascular;  Laterality: Left;  . CYSTOSCOPY WITH  RETROGRADE PYELOGRAM, URETEROSCOPY AND STENT PLACEMENT Right 07/17/2016   Procedure: CYSTOSCOPY WITH RETROGRADE PYELOGRAM, URETEROSCOPY AND STENT PLACEMENT,LASER;  Surgeon: Festus Aloe, MD;  Location: [facility] ORS;  Service: Urology;  Laterality: Right;  . CYSTOSCOPY/URETEROSCOPY/HOLMIUM LASER/STENT PLACEMENT Right 07/13/2016   Procedure: CYSTOSCOPY, RIGHT URETEROSCOPY RETROGRADE PYELOGRAM;  Surgeon: Kathie Rhodes, MD;  Location: [facility];  Service: Urology;  Laterality: Right;  . EMBOLECTOMY Left 04/05/2014   Procedure: THROMBO ENDARTERECTOMY OF LEFT BRACHIAL, RADIAL AND ULNAR ARTERY,  Left Radial artery cut down and radial artery thrombectomy.;  Surgeon: Elam Dutch, MD;  Location: [facility] OR;  Service: Vascular;  Laterality: Left;  . ESOPHAGOGASTRODUODENOSCOPY N/A 01/30/2019   Procedure: ESOPHAGOGASTRODUODENOSCOPY (EGD);  Surgeon: Jesusita Oka, MD;  Location: [facility] ENDOSCOPY;  Service: General;  Laterality: N/A;  . facial cyst Right    cyst removal.   . IR REPLC GASTRO/COLONIC TUBE PERCUT W/FLUORO  02/17/2019  . PATCH ANGIOPLASTY Left 04/05/2014   Procedure: LEFT ARM VEIN PATCH ANGIOPLASTY;  Surgeon: Elam Dutch, MD;  Location: [place];  Service: Vascular;  Laterality: Left;  . PEG PLACEMENT N/A 01/30/2019   Procedure: PERCUTANEOUS ENDOSCOPIC GASTROSTOMY (PEG) PLACEMENT;  Surgeon: Jesusita Oka, MD;  Location: [place];  Service: General;  Laterality: N/A;  . PERIPHERAL VASCULAR CATHETERIZATION N/A 01/22/2015   Procedure: Aortic Arch Angiography;  Surgeon: Elam Dutch, MD;  Location: [place] CV LAB;  Service: Cardiovascular;  Laterality: N/A;  . THROMBECTOMY BRACHIAL ARTERY Left 11/20/2014   Procedure: 1.  Thrombectomy Left Axilo-Brachial Bypass  2.  Thromboendarterecotmy of Left Brachial Artery with Fogarty Thrombectomy of Radial and Ulnar Arteries with Dacron patch angioplasty Left Brachial Artery. 3. Intraoperative  Arteriogram times four.;  Surgeon: Mal Misty, MD;  Location: [place];  Service: Vascular;  Laterality: Left;  . TRANSTHORACIC ECHOCARDIOGRAM  11/25/2014   moderate concentric LVH, ef 60-65%, unable to evaluation LVDF due to atrial fib/  mild MR/  severe LAE  . VEIN HARVEST Left 04/08/2014   Procedure: LEFT GREATER SAPHENOUS VEIN HARVEST;  Surgeon: Elam Dutch, MD;  Location: [place];  Service: Vascular;  Laterality: Left;  Marland Kitchen VEIN HARVEST Right 01/27/2015   Procedure: RIGHT GREATER SAPHENOUS VEIN HARVEST;  Surgeon: Elam Dutch, MD;  Location: [place];  Service: Vascular;  Laterality: Right;    Social History   Socioeconomic History  . Marital status: Divorced    Spouse name: Not on file  . Number of children: 1  . Years of education: Not on file  . Highest education level: Not on file  Occupational History  . 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 Smoking status: Current Every Day Smoker    Packs/day: 2.00    Years: 51.00    Pack years: 102.00    Types:  Cigarettes  . Smokeless tobacco: Never Used  . Tobacco comment: per pt 07-08-2016 down to 2ppd from 4 ppd (quit one time for 4 years since started at age 37)  Substance and Sexual Activity  . Alcohol use: Yes    Alcohol/week: 0.0 standard drinks    Comment: occasional   . Drug use: No    Comment: per pt 07-08-2016 hx drug use stopped 1970's  . Sexual activity: Not on file  Other Topics Concern  . Not on file  Social History Narrative  . Not on file   Social Determinants of Health   Financial Resource Strain:   . Difficulty of Paying Living Expenses: Not on file  Food Insecurity:   . Worried About Charity fundraiser in the Last Year: Not on file  . Ran Out of Food in the Last Year: Not on file  Transportation Needs:   . Lack of Transportation (Medical): Not on file  . Lack of Transportation (Non-Medical): Not on file  Physical Activity:   . Days of Exercise per Week: Not on file  . Minutes of Exercise per Session:  Not on file  Stress:   . Feeling of Stress : Not on file  Social Connections:   . Frequency of Communication with Friends and Family: Not on file  . Frequency of Social Gatherings with Friends and Family: Not on file  . Attends Religious Services: Not on file  . Active Member of Clubs or Organizations: Not on file  . Attends Archivist Meetings: Not on file  . Marital Status: Not on file  Intimate Partner Violence:   . Fear of Current or Ex-Partner: Not on file  . Emotionally Abused: Not on file  . Physically Abused: Not on file  . Sexually Abused: Not on file    Family History  Problem Relation Age of Onset  . Hypertension Mother   . Lung cancer Mother   . Stomach cancer Father        died age 2  . Cancer Sister   . Early death Neg Hx   . Heart disease Neg Hx   . Hyperlipidemia Neg Hx   . Kidney disease Neg Hx   . Stroke Neg Hx     Current Outpatient Medications  Medication Sig Dispense Refill  . acetaminophen (TYLENOL) 500 MG tablet Take 500 mg by mouth every 6 (six) hours as needed for mild pain.     Marland Kitchen alum & mag hydroxide-simeth (MAALOX/MYLANTA) 200-200-20 MG/5ML suspension Take 30 mLs by mouth every 6 (six) hours as needed for indigestion or heartburn. 355 mL 0  . Amino Acids-Protein Hydrolys (FEEDING SUPPLEMENT, PRO-STAT SUGAR FREE 64,) LIQD Place 30 mLs into feeding tube 2 (two) times daily. 887 mL 0  . amiodarone (PACERONE) 200 MG tablet Place 1 tablet (200 mg total) into feeding tube daily. 30 tablet 1  . apixaban (ELIQUIS) 5 MG TABS tablet Place 1 tablet (5 mg total) into feeding tube 2 (two) times daily. 60 tablet 1  . atorvastatin (LIPITOR) 40 MG tablet Take 1 tablet (40 mg total) by mouth daily. For high cholesterol 90 tablet 3  . diltiazem (CARDIZEM CD) 120 MG 24 hr capsule Take 1 capsule (120 mg total) by mouth daily. 30 capsule 1  . divalproex (DEPAKOTE SPRINKLE) 125 MG capsule Take 4 capsules (500 mg total) by mouth every 12 (twelve) hours. 240  capsule 1  . folic acid (FOLVITE) 1 MG tablet Take 2 tablets (2 mg total) by  mouth daily. 30 tablet 1  . furosemide (LASIX) 20 MG tablet Take 20 mg by mouth daily.     . metoprolol tartrate (LOPRESSOR) 25 mg/10 mL SUSP Place 40 mLs (100 mg total) into feeding tube 2 (two) times daily. 1200 mL 1  . mouth rinse LIQD solution 15 mLs by Mouth Rinse route 2 (two) times daily. 354 mL 1  . QUEtiapine (SEROQUEL) 50 MG tablet Take 3 tablets (150 mg total) by mouth at bedtime. 30 tablet 1  . QUEtiapine (SEROQUEL) 50 MG tablet Place 1 tablet (50 mg total) into feeding tube daily. 30 tablet 1  . traMADol (ULTRAM) 50 MG tablet Take 50-100 mg by mouth every 6 (six) hours as needed for moderate pain.     . vitamin B-12 1000 MCG tablet Place 1 tablet (1,000 mcg total) into feeding tube daily. 30 tablet 1   No current facility-administered medications for this visit.    Allergies  Allergen Reactions  . Oxycodone Itching     REVIEW OF SYSTEMS:   [X]  denotes positive finding, [ ]  denotes negative finding Cardiac  Comments:  Chest pain or chest pressure:    Shortness of breath upon exertion:    Short of breath when lying flat:    Irregular heart rhythm:        Vascular    Pain in calf, thigh, or hip brought on by ambulation:    Pain in feet at night that wakes you up from your sleep:     Blood clot in your veins:    Leg swelling:         Pulmonary    Oxygen at home:    Productive cough:     Wheezing:         Neurologic    Sudden weakness in arms or legs:     Sudden numbness in arms or legs:     Sudden onset of difficulty speaking or slurred speech:    Temporary loss of vision in one eye:     Problems with dizziness:         Gastrointestinal    Blood in stool:     Vomited blood:         Genitourinary    Burning when urinating:     Blood in urine:        Psychiatric    Major depression:         Hematologic    Bleeding problems:    Problems with blood clotting too easily:          Skin    Rashes or ulcers:        Constitutional    Fever or chills:      PHYSICAL EXAMINATION:  Vitals:   04/05/19 1417  BP: 102/67  Pulse: 68  Resp: 18  Temp: (!) 97 F (36.1 C)  TempSrc: Temporal  Weight: 162 lb (73.5 kg)    General:  WDWN in NAD; vital signs documented above Gait: Not observed HENT: WNL, normocephalic Pulmonary: normal non-labored breathing , without Rales, rhonchi,  wheezing Cardiac: irregular Abdomen: soft, NT, no masses Skin: without rashes Vascular Exam/Pulses:  Right Left  Radial 2+ (normal) absent  Ulnar absent absent  DP Brisk signal Brisk signal  PT 1+ (weak) 1+ (weak)   Extremities: R GT tip necrotic ulcer, dry, no drainage, no erythema Musculoskeletal: no muscle wasting or atrophy  Neurologic: A&O X 3;  No focal weakness or paresthesias are detected Psychiatric:  The pt has Normal  affect.   Non-Invasive Vascular Imaging:   none    ASSESSMENT/PLAN:: 69 y.o. male here for follow up for recheck of R GT ulcer  Ulcer slow to heal however patient states it is improving Normal ABIs however abnormal toe pressures performed in the hospital He has a family palpable PT and a brisk DP signal Recommended soap and water cleansing twice daily to the affected area Also recommended to avoid pressure to his great toe tip He will follow-up in 4 to 6 weeks for recheck if at that time no improvement has been made he will require angiography   Dagoberto Ligas, PA-C [facility] [phone]  Clinic MD:   Dr. Oneida Alar

## 2019-04-09 ENCOUNTER — Other Ambulatory Visit: Payer: Self-pay | Admitting: *Deleted

## 2019-04-09 NOTE — Patient Outreach (Signed)
Member screened for potential [facility] Care Management needs as a benefit of [place] [employer].  Mr. Gunnerson is currently receiving skilled therapy at [facility] in [place].   Spoke with facility Geographical information systems officer. Anticipated transition plan is for long term care at the facility.   [name], MSN-Ed, RN,BSN [place] Acute Care Coordinator [phone] [facility]) [phone]  (Toll free office)

## 2019-04-16 ENCOUNTER — Other Ambulatory Visit: Payer: Self-pay | Admitting: *Deleted

## 2019-04-16 DIAGNOSIS — Z23 Encounter for immunization: Secondary | ICD-10-CM | POA: Diagnosis not present

## 2019-04-16 DIAGNOSIS — Z03818 Encounter for observation for suspected exposure to other biological agents ruled out: Secondary | ICD-10-CM | POA: Diagnosis not present

## 2019-04-16 NOTE — Patient Outreach (Signed)
[place] Coordinator follow up  Verified in Patient [place] that Mr. Wolverton transitioned to long term care at [facility].   [name], MSN-Ed, RN,BSN [place] Acute Care Coordinator [phone] [facility]) [phone]  (Toll free office)

## 2019-04-22 DIAGNOSIS — I63432 Cerebral infarction due to embolism of left posterior cerebral artery: Secondary | ICD-10-CM | POA: Diagnosis not present

## 2019-04-22 DIAGNOSIS — I119 Hypertensive heart disease without heart failure: Secondary | ICD-10-CM | POA: Diagnosis not present

## 2019-04-22 DIAGNOSIS — R569 Unspecified convulsions: Secondary | ICD-10-CM | POA: Diagnosis not present

## 2019-04-22 DIAGNOSIS — B9729 Other coronavirus as the cause of diseases classified elsewhere: Secondary | ICD-10-CM | POA: Diagnosis not present

## 2019-04-23 DIAGNOSIS — Z03818 Encounter for observation for suspected exposure to other biological agents ruled out: Secondary | ICD-10-CM | POA: Diagnosis not present

## 2019-04-25 DIAGNOSIS — I63432 Cerebral infarction due to embolism of left posterior cerebral artery: Secondary | ICD-10-CM | POA: Diagnosis not present

## 2019-04-25 DIAGNOSIS — B9729 Other coronavirus as the cause of diseases classified elsewhere: Secondary | ICD-10-CM | POA: Diagnosis not present

## 2019-04-30 DIAGNOSIS — I482 Chronic atrial fibrillation, unspecified: Secondary | ICD-10-CM | POA: Diagnosis not present

## 2019-04-30 DIAGNOSIS — I952 Hypotension due to drugs: Secondary | ICD-10-CM | POA: Diagnosis not present

## 2019-04-30 DIAGNOSIS — I63432 Cerebral infarction due to embolism of left posterior cerebral artery: Secondary | ICD-10-CM | POA: Diagnosis not present

## 2019-04-30 DIAGNOSIS — Z03818 Encounter for observation for suspected exposure to other biological agents ruled out: Secondary | ICD-10-CM | POA: Diagnosis not present

## 2019-04-30 DIAGNOSIS — B9729 Other coronavirus as the cause of diseases classified elsewhere: Secondary | ICD-10-CM | POA: Diagnosis not present

## 2019-05-01 DIAGNOSIS — D649 Anemia, unspecified: Secondary | ICD-10-CM | POA: Diagnosis not present

## 2019-05-01 DIAGNOSIS — Z79899 Other long term (current) drug therapy: Secondary | ICD-10-CM | POA: Diagnosis not present

## 2019-05-04 DIAGNOSIS — I739 Peripheral vascular disease, unspecified: Secondary | ICD-10-CM | POA: Diagnosis not present

## 2019-05-04 DIAGNOSIS — R1312 Dysphagia, oropharyngeal phase: Secondary | ICD-10-CM | POA: Diagnosis not present

## 2019-05-04 DIAGNOSIS — I69391 Dysphagia following cerebral infarction: Secondary | ICD-10-CM | POA: Diagnosis not present

## 2019-05-04 DIAGNOSIS — R2681 Unsteadiness on feet: Secondary | ICD-10-CM | POA: Diagnosis not present

## 2019-05-04 DIAGNOSIS — I639 Cerebral infarction, unspecified: Secondary | ICD-10-CM | POA: Diagnosis not present

## 2019-05-04 DIAGNOSIS — I6930 Unspecified sequelae of cerebral infarction: Secondary | ICD-10-CM | POA: Diagnosis not present

## 2019-05-04 DIAGNOSIS — R569 Unspecified convulsions: Secondary | ICD-10-CM | POA: Diagnosis not present

## 2019-05-04 DIAGNOSIS — N401 Enlarged prostate with lower urinary tract symptoms: Secondary | ICD-10-CM | POA: Diagnosis not present

## 2019-05-04 DIAGNOSIS — R419 Unspecified symptoms and signs involving cognitive functions and awareness: Secondary | ICD-10-CM | POA: Diagnosis not present

## 2019-05-04 DIAGNOSIS — F321 Major depressive disorder, single episode, moderate: Secondary | ICD-10-CM | POA: Diagnosis not present

## 2019-05-04 DIAGNOSIS — F339 Major depressive disorder, recurrent, unspecified: Secondary | ICD-10-CM | POA: Diagnosis not present

## 2019-05-04 DIAGNOSIS — J9601 Acute respiratory failure with hypoxia: Secondary | ICD-10-CM | POA: Diagnosis not present

## 2019-05-04 DIAGNOSIS — I10 Essential (primary) hypertension: Secondary | ICD-10-CM | POA: Diagnosis not present

## 2019-05-04 DIAGNOSIS — I482 Chronic atrial fibrillation, unspecified: Secondary | ICD-10-CM | POA: Diagnosis not present

## 2019-05-04 DIAGNOSIS — G9341 Metabolic encephalopathy: Secondary | ICD-10-CM | POA: Diagnosis not present

## 2019-05-04 DIAGNOSIS — F329 Major depressive disorder, single episode, unspecified: Secondary | ICD-10-CM | POA: Diagnosis not present

## 2019-05-04 DIAGNOSIS — J449 Chronic obstructive pulmonary disease, unspecified: Secondary | ICD-10-CM | POA: Diagnosis not present

## 2019-05-04 DIAGNOSIS — I69320 Aphasia following cerebral infarction: Secondary | ICD-10-CM | POA: Diagnosis not present

## 2019-05-04 DIAGNOSIS — R2689 Other abnormalities of gait and mobility: Secondary | ICD-10-CM | POA: Diagnosis not present

## 2019-05-04 DIAGNOSIS — I714 Abdominal aortic aneurysm, without rupture: Secondary | ICD-10-CM | POA: Diagnosis not present

## 2019-05-04 DIAGNOSIS — E119 Type 2 diabetes mellitus without complications: Secondary | ICD-10-CM | POA: Diagnosis not present

## 2019-05-04 DIAGNOSIS — R278 Other lack of coordination: Secondary | ICD-10-CM | POA: Diagnosis not present

## 2019-05-04 DIAGNOSIS — R4701 Aphasia: Secondary | ICD-10-CM | POA: Diagnosis not present

## 2019-05-04 DIAGNOSIS — M6281 Muscle weakness (generalized): Secondary | ICD-10-CM | POA: Diagnosis not present

## 2019-05-14 ENCOUNTER — Ambulatory Visit: Payer: Medicare Other

## 2019-05-14 ENCOUNTER — Other Ambulatory Visit: Payer: Self-pay | Admitting: *Deleted

## 2019-05-14 DIAGNOSIS — I10 Essential (primary) hypertension: Secondary | ICD-10-CM

## 2019-05-14 NOTE — Progress Notes (Deleted)
HISTORY AND PHYSICAL     CC:  follow up. Requesting Provider:  Shon Baton, MD  HPI: This is a 69 y.o. male who is here today for follow up.  He was seen in February 2021 for follow up wound check of right great toe ulceration.   At the end of last year he sustained an embolic left-sided CVA leaving him with expressive aphasia.  He continues to be in a skilled nursing facility for ongoing rehab.  Vascular was consulted during hospital admission for right great toe wound.  Given that he had a palpable posterior tibial artery pulse and a normal ABI no further work-up was performed.  When he was seen in February, he did not have any nursing staff or family members with him and he was a very poor historian and unable to come up with words to complete any coherent sentences.  When asked if he has had any pain in his right toe he shakes his head no.  When asked if he thinks his right toe is getting better he shakes his head yes.  Based on chart review he appears to be on Eliquis for atrial fibrillation.  He is also on a statin.  He was ambulatory without assistance at his last visit.    In February, his toe ulcer was slow to heal but pt felt it was improving.  He had normal ABI's but abnormal toe pressures in the hospital.  He had a faintly palpable PT and brisk DP doppler signal.  It was recommended to wash with soap and water daily and avoid pressure to hsi great toe tip and f/u in 6 weeks.  If no improvement, may need arteriogrqam.   Pt has hx of thromboendarterectomy of left brachial, radial and ulnar arteries with vein patch angioplasty on 04/05/2014.  He subsequently underwent left axillary to brachial artery bypass using reversed left GSV with ligation of left axillary artery aneurysm on 04/08/2014 by Dr. Oneida Alar.  In October 2016, he underwent thrombectomy of the left axilo brachial bypass and Thromboendarterecotmy of Left Brachial Artery with Fogarty Thrombectomy of Radial and Ulnar Arteries with Dacron  patch angioplasty Left Brachial Artery by Dr. Kellie Simmering.   On 01/27/2015, he underwent Left brachial to ulnar artery bypass using reversed right greater saphenous vein by Dr. Oneida Alar.  Pt had LUE arterial duplex in January 2021 and was found to be occluded.  He was advised against smoking and may need to consider LUE arteriogram if he develops progressive ischemic changes with ulceration and imminent limb loss of the left hand.   The pt returns today for follow up.  ***  The pt is on a statin for cholesterol management.    The pt is not on an aspirin.    Other AC:  Eliquis The pt is on CCB, BB for hypertension.  The pt does not have diabetes. Tobacco hx:  ***   Past Medical History:  Diagnosis Date  . AAA (abdominal aortic aneurysm) ([place])    per last CT 03-26-2016---  3.9cm  . Anticoagulated on Coumadin   . Arthritis of spine   . Benign localized prostatic hyperplasia with lower urinary tract symptoms (LUTS)   . Chronic atrial fibrillation ([place]) cardiolgoist -- dr Stanford Breed   dx 2009  . Complication of anesthesia    per pt today (07-08-2016) had problem after having anesthesia 12/ 2016 surgery at [facility] "not good" went to ED ( in epic ED visit 01/ 2017 dx severe episode of major depression disorder  and admitted to [facility]  . COPD with emphysema ([place])   . Dyspnea on exertion   . Full dentures   . GERD (gastroesophageal reflux disease)   . Hematuria   . History of amaurosis fugax    right eye 02-08-2015 -- resolved /  documented in epic possible left eye amaurosis fugax 11/ 2008 (pt denies) / per last MRI show previous bilateral occipital lobe infarcts  . History of concussion    as child  . History of CVA (cerebrovascular accident) 10/ 2016  and per pt Dec 2017 in [place]   neurologist-  dr Leonie Man-- per note silent infarcts on MRI-- multiple acute infarcts bilateral cerebullm, right temporal lobe, and bilateral occipital lobes due to embolic phenomena (atrial fib.)  . History of seizures as a  child   . Hyperlipidemia   . Hypertension   . Left arm numbness    occasional numbess post residual axilla arterial occlusion and surgery  . Major depressive disorder    hx severe episode without psychosis 02/2015 w/ homicidal ideation  . Narcotic dependence ([place])   . PAD (peripheral artery disease) ([place])    followed by dr fields-- s/p  left axilla to branchial and left axilla to ulnar bypass graft due to ischemic hand  . Pulmonary nodule    per CT 03-26-2016  right lower lobe  . PVD (peripheral vascular disease) ([place])   . Right ureteral stone   . Senile purpura ([place])   . Weakness of left arm    residual from axilla arterial occlusion and post surgery  . Wears glasses     Past Surgical History:  Procedure Laterality Date  . ARCH AORTOGRAM N/A 04/05/2014   Procedure: ARCH AORTOGRAM, FIRST ORDER CATHETERIZATION LEFT SUBCLAVIAN ARTERY;  Surgeon: Elam Dutch, MD;  Location: [facility] OR;  Service: Vascular;  Laterality: N/A;  . AXILLARY-FEMORAL BYPASS GRAFT Left 04/08/2014   Procedure: LEFT AXILLARY ARTERY TO BRACHIAL ARTERY BYPASS USING NON REVERSE LEFT GREATER SAPHENOUS VEIN ,LIGATION OF LEFT AXILLARY ARTERY ANEURYSM;  Surgeon: Elam Dutch, MD;  Location: [place];  Service: Vascular;  Laterality: Left;  . BYPASS AXILLA/BRACHIAL ARTERY Left 01/27/2015   Procedure: LEFT BRACHIAL-ULAR ARTERY BYPASS USING GREATER SAPHENOUS VEIN;  Surgeon: Elam Dutch, MD;  Location: [place];  Service: Vascular;  Laterality: Left;  . CYSTOSCOPY WITH RETROGRADE PYELOGRAM, URETEROSCOPY AND STENT PLACEMENT Right 07/17/2016   Procedure: CYSTOSCOPY WITH RETROGRADE PYELOGRAM, URETEROSCOPY AND STENT PLACEMENT,LASER;  Surgeon: Festus Aloe, MD;  Location: [facility] OR[facility];  Service: Urology;  Laterality: Right;  . CYSTOSCOPY/URETEROSCOPY/HOLMIUM LASER/STENT PLACEMENT Right 07/13/2016   Procedure: CYSTOSCOPY, RIGHT URETEROSCOPY RETROGRADE PYELOGRAM;  Surgeon: Kathie Rhodes, MD;  Location: [facility];   Service: Urology;  Laterality: Right;  . EMBOLECTOMY Left 04/05/2014   Procedure: THROMBO ENDARTERECTOMY OF LEFT BRACHIAL, RADIAL AND ULNAR ARTERY,  Left Radial artery cut down and radial artery thrombectomy.;  Surgeon: Elam Dutch, MD;  Location: [facility] OR;  Service: Vascular;  Laterality: Left;  . ESOPHAGOGASTRODUODENOSCOPY N/A 01/30/2019   Procedure: ESOPHAGOGASTRODUODENOSCOPY (EGD);  Surgeon: Jesusita Oka, MD;  Location: [facility] ENDOSCOPY;  Service: General;  Laterality: N/A;  . facial cyst Right    cyst removal.   . IR REPLC GASTRO/COLONIC TUBE PERCUT W/FLUORO  02/17/2019  . PATCH ANGIOPLASTY Left 04/05/2014   Procedure: LEFT ARM VEIN PATCH ANGIOPLASTY;  Surgeon: Elam Dutch, MD;  Location: [place];  Service: Vascular;  Laterality: Left;  . PEG PLACEMENT N/A 01/30/2019   Procedure: PERCUTANEOUS ENDOSCOPIC GASTROSTOMY (PEG)  PLACEMENT;  Surgeon: Jesusita Oka, MD;  Location: [facility] ENDOSCOPY;  Service: General;  Laterality: N/A;  . PERIPHERAL VASCULAR CATHETERIZATION N/A 01/22/2015   Procedure: Aortic Arch Angiography;  Surgeon: Elam Dutch, MD;  Location: [place] CV LAB;  Service: Cardiovascular;  Laterality: N/A;  . THROMBECTOMY BRACHIAL ARTERY Left 11/20/2014   Procedure: 1.  Thrombectomy Left Axilo-Brachial Bypass  2.  Thromboendarterecotmy of Left Brachial Artery with Fogarty Thrombectomy of Radial and Ulnar Arteries with Dacron patch angioplasty Left Brachial Artery. 3. Intraoperative  Arteriogram times four.;  Surgeon: Mal Misty, MD;  Location: [place];  Service: Vascular;  Laterality: Left;  . TRANSTHORACIC ECHOCARDIOGRAM  11/25/2014   moderate concentric LVH, ef 60-65%, unable to evaluation LVDF due to atrial fib/  mild MR/  severe LAE  . VEIN HARVEST Left 04/08/2014   Procedure: LEFT GREATER SAPHENOUS VEIN HARVEST;  Surgeon: Elam Dutch, MD;  Location: [place];  Service: Vascular;  Laterality: Left;  Marland Kitchen VEIN HARVEST Right 01/27/2015   Procedure: RIGHT GREATER SAPHENOUS  VEIN HARVEST;  Surgeon: Elam Dutch, MD;  Location: [place];  Service: Vascular;  Laterality: Right;    Allergies  Allergen Reactions  . Oxycodone Itching    Current Outpatient Medications  Medication Sig Dispense Refill  . acetaminophen (TYLENOL) 500 MG tablet Take 500 mg by mouth every 6 (six) hours as needed for mild pain.     Marland Kitchen alum & mag hydroxide-simeth (MAALOX/MYLANTA) 200-200-20 MG/5ML suspension Take 30 mLs by mouth every 6 (six) hours as needed for indigestion or heartburn. 355 mL 0  . Amino Acids-Protein Hydrolys (FEEDING SUPPLEMENT, PRO-STAT SUGAR FREE 64,) LIQD Place 30 mLs into feeding tube 2 (two) times daily. 887 mL 0  . amiodarone (PACERONE) 200 MG tablet Place 1 tablet (200 mg total) into feeding tube daily. 30 tablet 1  . apixaban (ELIQUIS) 5 MG TABS tablet Place 1 tablet (5 mg total) into feeding tube 2 (two) times daily. 60 tablet 1  . atorvastatin (LIPITOR) 40 MG tablet Take 1 tablet (40 mg total) by mouth daily. For high cholesterol 90 tablet 3  . diltiazem (CARDIZEM CD) 120 MG 24 hr capsule Take 1 capsule (120 mg total) by mouth daily. 30 capsule 1  . divalproex (DEPAKOTE SPRINKLE) 125 MG capsule Take 4 capsules (500 mg total) by mouth every 12 (twelve) hours. [id number] capsule 1  . folic acid (FOLVITE) 1 MG tablet Take 2 tablets (2 mg total) by mouth daily. 30 tablet 1  . furosemide (LASIX) 20 MG tablet Take 20 mg by mouth daily.     . metoprolol tartrate (LOPRESSOR) 25 mg/10 mL SUSP Place 40 mLs (100 mg total) into feeding tube 2 (two) times daily. 1200 mL 1  . mouth rinse LIQD solution 15 mLs by Mouth Rinse route 2 (two) times daily. 354 mL 1  . QUEtiapine (SEROQUEL) 50 MG tablet Take 3 tablets (150 mg total) by mouth at bedtime. 30 tablet 1  . QUEtiapine (SEROQUEL) 50 MG tablet Place 1 tablet (50 mg total) into feeding tube daily. 30 tablet 1  . traMADol (ULTRAM) 50 MG tablet Take 50-100 mg by mouth every 6 (six) hours as needed for moderate pain.     . vitamin B-12  1000 MCG tablet Place 1 tablet (1,000 mcg total) into feeding tube daily. 30 tablet 1   No current facility-administered medications for this visit.    Family History  Problem Relation Age of Onset  . Hypertension Mother   . Lung  cancer Mother   . Stomach cancer Father        died age 40  . Cancer Sister   . Early death Neg Hx   . Heart disease Neg Hx   . Hyperlipidemia Neg Hx   . Kidney disease Neg Hx   . Stroke Neg Hx     Social History   Socioeconomic History  . Marital status: Divorced    Spouse name: Not on file  . Number of children: 1  . Years of education: Not on file  . Highest education level: Not on file  Occupational History  . 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 Smoking status: Current Every Day Smoker    Packs/day: 2.00    Years: 51.00    Pack years: 102.00    Types: Cigarettes  . Smokeless tobacco: Never Used  . Tobacco comment: per pt 07-08-2016 down to 2ppd from 4 ppd (quit one time for 4 years since started at age 17)  Substance and Sexual Activity  . Alcohol use: Yes    Alcohol/week: 0.0 standard drinks    Comment: occasional   . Drug use: No    Comment: per pt 07-08-2016 hx drug use stopped 1970's  . Sexual activity: Not on file  Other Topics Concern  . Not on file  Social History Narrative  . Not on file   Social Determinants of Health   Financial Resource Strain:   . Difficulty of Paying Living Expenses:   Food Insecurity:   . Worried About Charity fundraiser in the Last Year:   . Arboriculturist in the Last Year:   Transportation Needs:   . Film/video editor (Medical):   Marland Kitchen Lack of Transportation (Non-Medical):   Physical Activity:   . Days of Exercise per Week:   . Minutes of Exercise per Session:   Stress:   . Feeling of Stress :   Social Connections:   . Frequency of Communication with Friends and Family:   . Frequency of Social Gatherings with Friends and Family:   . Attends  Religious Services:   . Active Member of Clubs or Organizations:   . Attends Archivist Meetings:   Marland Kitchen Marital Status:   Intimate Partner Violence:   . Fear of Current or Ex-Partner:   . Emotionally Abused:   Marland Kitchen Physically Abused:   . Sexually Abused:      REVIEW OF SYSTEMS:  *** [X]  denotes positive finding, [ ]  denotes negative finding Cardiac  Comments:  Chest pain or chest pressure:    Shortness of breath upon exertion:    Short of breath when lying flat:    Irregular heart rhythm:        Vascular    Pain in calf, thigh, or hip brought on by ambulation:    Pain in feet at night that wakes you up from your sleep:     Blood clot in your veins:    Leg swelling:         Pulmonary    Oxygen at home:    Productive cough:     Wheezing:         Neurologic    Sudden weakness in arms or legs:     Sudden numbness in arms or legs:     Sudden onset of difficulty speaking or slurred speech:    Temporary loss of vision in one eye:     Problems with dizziness:  Gastrointestinal    Blood in stool:     Vomited blood:         Genitourinary    Burning when urinating:     Blood in urine:        Psychiatric    Major depression:         Hematologic    Bleeding problems:    Problems with blood clotting too easily:        Skin    Rashes or ulcers:        Constitutional    Fever or chills:      PHYSICAL EXAMINATION:  ***  General:  WDWN in NAD; vital signs documented above Gait: Not observed HENT: WNL, normocephalic Pulmonary: normal non-labored breathing , without Rales, rhonchi,  wheezing Cardiac: {Desc; regular/irreg:14544} HR, without  Murmurs; {With/Without:20273} carotid bruit*** Abdomen: soft, NT, no masses Skin: {With/Without:20273} rashes Vascular Exam/Pulses:  Right Left  Radial {Exam; arterial pulse strength 0-4:30167} {Exam; arterial pulse strength 0-4:30167}  Ulnar {Exam; arterial pulse strength 0-4:30167} {Exam; arterial pulse strength  0-4:30167}  Femoral {Exam; arterial pulse strength 0-4:30167} {Exam; arterial pulse strength 0-4:30167}  Popliteal {Exam; arterial pulse strength 0-4:30167} {Exam; arterial pulse strength 0-4:30167}  DP {Exam; arterial pulse strength 0-4:30167} {Exam; arterial pulse strength 0-4:30167}  PT {Exam; arterial pulse strength 0-4:30167} {Exam; arterial pulse strength 0-4:30167}   Extremities: {With/Without:20273} ischemic changes, {With/Without:20273} Gangrene , {With/Without:20273} cellulitis; {With/Without:20273} open wounds;  Musculoskeletal: no muscle wasting or atrophy  Neurologic: A&O X 3;  No focal weakness or paresthesias are detected Psychiatric:  The pt has {Desc; normal/abnormal:11317::"Normal"} affect.   Non-Invasive Vascular Imaging:   ABI's/TBI's on ***: Right:  *** - Great toe pressure: *** Left:  *** - Great toe pressure: ***  *** Arterial duplex on ***:  Previous ABI's/TBI's on 02/26/2019: Right:  1/15 - Great toe pressure: absent Left:  1.20 - Great toe pressure:  0.86   ASSESSMENT/PLAN:: 69 y.o. male here for follow up for right great toe ulcer.   -***   *** [facility] MD:   Trula Slade

## 2019-05-14 NOTE — Patient Outreach (Signed)
[place] Coordinator follow-up.  Made aware by [facility] UM that member discharged to home on 05/13/19. Note member was staying at [facility] for long term care. Not clear why member discharged from SNF.  Voicemail message left for facility social worker to inquire about home health arrangements and dc details.   Telephone calls made to Mr. Musleh mobile on record [phone]. Non working phone. Telephone call made to [name] brother [phone]. Voicemail box full. Unable to leave message. Telephone call made to roommate on record [name] [phone] to discuss [facility] services.  Patient identifiers confirmed. Shanon Brow states member is asleep. Shanon Brow (roommate) affirms he was surprised member discharged home. Shanon Brow states "we need all the help we can get." Shanon Brow reports he needs assistance with med management. States he needs a pill box and help filling it. Shanon Brow states he is confused by all of the medicatons. Agreeable to [facility] RNCM referral, [facility] referral, and [facility] referral.  Shanon Brow also states they do not have a vehicle and that their landlord is supposed to help them secure a car.  Shanon Brow agreeable to [facility] LCSW referral for potential transportation needs.   Throughout lengthy conversation, Harrison Mons to Probation officer to speak very slow in order for him to understand and hear.  Shanon Brow is unsure of what home health agency, if any, was arranged. Shanon Brow encouraged to contact PCP, Dr. Virgina Jock to schedule follow up appointment.  [name] (roommate/caregiver) reports his cell is the best way to reach Mr. Badenhop. States the address in Epic is not correct. States member's current address is [address], [place], [place] [address]. Best contact is [phone]  Will make [facility] [facility] referrals for RNCM, LCSW and [place] for med adherence. [facility] referral pending. Member is high risk for readmission. Could likely benefit from outpatient palliative or hospice. Unable to  address during the call.    [name], MSN-Ed, RN,BSN [place] Acute Care Coordinator [phone] [facility] [phone]  (Toll free office)

## 2019-05-15 ENCOUNTER — Other Ambulatory Visit: Payer: Self-pay | Admitting: *Deleted

## 2019-05-15 NOTE — Patient Outreach (Signed)
[facility]) Care Management  05/15/2019  [name] Aug 20, 1950 [id number]   Referral received from post acute care coordinator as member was discharged from SNF on 4/4.  He was admitted to hospital 01/18/2019-03/03/2019 after having a stroke and has been at [facility] since discharge.  Per chart, he has history of HTN, A-fib, COPD, and HLD.  Noted per PAC note member's caregiver is [name], [phone].    Call placed to [facility] to follow up on member's discharge, he immediately becomes belligerent with this care manager, stating he is tired of all of the calls and does not have time to do anything.  Report he is trying to eat, this care manager offered to call him back but he insisted on remaining on call. Although he remained on call he continued to be rude and this care manager was unable to complete assessment.  He expresses frustration about the process of the discharge, stating he has no clue of what to do.  This care manager attempted to discuss plan of care, unable to help him refocus.  He state there was a nurse that visited today Lattie Haw, unsure if from home health or Remote [facility]) and will be back the end of the week.  He then state "ok, we're done" and abruptly ends call.    Will make another attempt to contact Mr. Dye within the next 3 days to complete assessment.  [name], [place], MSN [place] [phone]

## 2019-05-16 ENCOUNTER — Encounter: Payer: Self-pay | Admitting: *Deleted

## 2019-05-16 ENCOUNTER — Other Ambulatory Visit: Payer: Self-pay | Admitting: *Deleted

## 2019-05-16 DIAGNOSIS — N401 Enlarged prostate with lower urinary tract symptoms: Secondary | ICD-10-CM | POA: Diagnosis not present

## 2019-05-16 DIAGNOSIS — I69320 Aphasia following cerebral infarction: Secondary | ICD-10-CM | POA: Diagnosis not present

## 2019-05-16 DIAGNOSIS — I714 Abdominal aortic aneurysm, without rupture: Secondary | ICD-10-CM | POA: Diagnosis not present

## 2019-05-16 DIAGNOSIS — E785 Hyperlipidemia, unspecified: Secondary | ICD-10-CM | POA: Diagnosis not present

## 2019-05-16 DIAGNOSIS — Z87820 Personal history of traumatic brain injury: Secondary | ICD-10-CM | POA: Diagnosis not present

## 2019-05-16 DIAGNOSIS — I482 Chronic atrial fibrillation, unspecified: Secondary | ICD-10-CM | POA: Diagnosis not present

## 2019-05-16 DIAGNOSIS — F17210 Nicotine dependence, cigarettes, uncomplicated: Secondary | ICD-10-CM | POA: Diagnosis not present

## 2019-05-16 DIAGNOSIS — Z8616 Personal history of COVID-19: Secondary | ICD-10-CM | POA: Diagnosis not present

## 2019-05-16 DIAGNOSIS — R911 Solitary pulmonary nodule: Secondary | ICD-10-CM | POA: Diagnosis not present

## 2019-05-16 DIAGNOSIS — J449 Chronic obstructive pulmonary disease, unspecified: Secondary | ICD-10-CM | POA: Diagnosis not present

## 2019-05-16 DIAGNOSIS — M5136 Other intervertebral disc degeneration, lumbar region: Secondary | ICD-10-CM | POA: Diagnosis not present

## 2019-05-16 DIAGNOSIS — Z7901 Long term (current) use of anticoagulants: Secondary | ICD-10-CM | POA: Diagnosis not present

## 2019-05-16 DIAGNOSIS — K219 Gastro-esophageal reflux disease without esophagitis: Secondary | ICD-10-CM | POA: Diagnosis not present

## 2019-05-16 DIAGNOSIS — I119 Hypertensive heart disease without heart failure: Secondary | ICD-10-CM | POA: Diagnosis not present

## 2019-05-16 DIAGNOSIS — I70208 Unspecified atherosclerosis of native arteries of extremities, other extremity: Secondary | ICD-10-CM | POA: Diagnosis not present

## 2019-05-16 DIAGNOSIS — R569 Unspecified convulsions: Secondary | ICD-10-CM | POA: Diagnosis not present

## 2019-05-16 DIAGNOSIS — F332 Major depressive disorder, recurrent severe without psychotic features: Secondary | ICD-10-CM | POA: Diagnosis not present

## 2019-05-16 DIAGNOSIS — I739 Peripheral vascular disease, unspecified: Secondary | ICD-10-CM | POA: Diagnosis not present

## 2019-05-16 NOTE — Patient Outreach (Signed)
[name]) Care Management  05/16/2019  [name] 1950-04-18 [id number]   CSW has made several attempts to try and contact patient today to perform the initial phone assessment, as well as assess and assist with social work needs and services, without success.  CSW was unable to leave a HIPAA compliant message on voicemail for patient, as patient's phone just continued to ring and ring.  CSW will make a second outreach attempt within the next 3-4 business days, if a return call is not received from patient in the meantime.  CSW will also mail an Outreach Letter to patient's home requesting that patient contact CSW if he is interested in receiving social work services through [place] with Scientist, clinical (histocompatibility and immunogenetics).  [name], BSW, MSW, LCSW  Licensed Education officer, environmental Health System  Mailing [place] [address] [place], [place] [address] Physical Address-[address], [place], [place] [address] Toll Free Main # [phone] Fax # [phone] Cell # [phone]  Office # [phone] [name]

## 2019-05-17 ENCOUNTER — Encounter: Payer: Self-pay | Admitting: Surgery

## 2019-05-17 ENCOUNTER — Ambulatory Visit: Payer: Self-pay | Admitting: *Deleted

## 2019-05-17 DIAGNOSIS — I70208 Unspecified atherosclerosis of native arteries of extremities, other extremity: Secondary | ICD-10-CM | POA: Diagnosis not present

## 2019-05-17 DIAGNOSIS — I739 Peripheral vascular disease, unspecified: Secondary | ICD-10-CM | POA: Diagnosis not present

## 2019-05-17 DIAGNOSIS — I119 Hypertensive heart disease without heart failure: Secondary | ICD-10-CM | POA: Diagnosis not present

## 2019-05-17 DIAGNOSIS — I69320 Aphasia following cerebral infarction: Secondary | ICD-10-CM | POA: Diagnosis not present

## 2019-05-17 DIAGNOSIS — I482 Chronic atrial fibrillation, unspecified: Secondary | ICD-10-CM | POA: Diagnosis not present

## 2019-05-17 DIAGNOSIS — I714 Abdominal aortic aneurysm, without rupture: Secondary | ICD-10-CM | POA: Diagnosis not present

## 2019-05-18 ENCOUNTER — Other Ambulatory Visit: Payer: Self-pay | Admitting: *Deleted

## 2019-05-18 NOTE — Patient Outreach (Signed)
[facility]) Care Management  05/18/2019  [name] [birth year] [id number]   Call placed to member's roommate/friend [name] to complete transition of care assessment.  He immediately state "this is not working out.  He doesn't want to take his pills, he doesn't want to do anything.  This care manager inquired about member needing higher level of care.  [name] state that would be best but report member was discharged from recent SNF for being belligerent and noncompliant.  Attempted to proceed with assessment however Mr. Dye state he is not longer able to talk as he is receiving an important call.    Will follow up within the next 3-4 business days.  [name], [place], MSN [place] [phone]

## 2019-05-21 ENCOUNTER — Other Ambulatory Visit: Payer: Self-pay | Admitting: *Deleted

## 2019-05-21 NOTE — Patient Outreach (Signed)
[facility]) Care Management  05/21/2019  [name] Jun 24, 1950 [id number]   CSW made a second attempt to try and contact patient today to perform the initial phone assessment, as well as assess and assist with social work needs and services, without success.  CSW was unable to leave a HIPAA compliant message on voicemail for patient, as CSW received an automated recording indicating that patient's home number has been disconnected and is no longer in service.  In addition, CSW tried calling patient's mobile number, but the phone just continued to ring and ring, with no answer.  CSW will make a third and final outreach attempt within the next 3-4 business days, if a return call is not received from patient in the meantime.  CSW will then proceed with case closure if a return call is not received from patient with a total of 10 business days, as required number of phone attempts will have been made and outreach letter mailed.   [name], BSW, MSW, LCSW  Licensed Education officer, environmental Health System  Mailing [place] [address], [place], [place] [address] Physical Address-[address], [place], [place] [address] Toll Free Main # [phone] Fax # [phone] Cell # [phone]  Office # [phone] [name]

## 2019-05-22 ENCOUNTER — Other Ambulatory Visit: Payer: Self-pay | Admitting: *Deleted

## 2019-05-22 NOTE — Patient Outreach (Signed)
[facility]) Care Management  05/22/2019  [name] Jul 07, 1950 [id number]   CSW received a belligerent call from patient's roommate/friend, [name] today, in response to CSW's call attempts to patient on Wednesday, May 16, 2019 and Monday, May 21, 2019, without success.  CSW inquired as to how [name] received CSW's contact information, as CSW tried calling patient's home number [phone]), but received an automated recording that the number had been disconnected or was no longer in service.  In addition, CSW tried calling patient's mobile number [phone]), but was unable to leave a HIPAA compliant message on voicemail, as the phone just continued to ring and ring.  [name] stated, "You were actually calling my cell phone number".  [name] went on to explain that he just so happened to be looking back on his phone for recent calls when he noticed CSW's contact number on several different occasions.  CSW voiced understanding, apologizing for any inconvenience.  CSW explained to [name] that patient has [name] mobile number listed as his own, hence the reason for the call attempts to patient.  CSW inquired as to whether or not there was another number for CSW to use to try and contact patient.  [name] denied, indicating that he is patient's contact person and that he needed to be notified regarding patient's plan of care.  CSW asked to speak directly with patient, to obtain verbal consent to speak with [name] on his behalf, but [name] reported that patient was unavailable.  CSW requested to leave a HIPAA compliant message for patient with [name], but [name] declined, insisting that CSW speak with him directly.  [name] began yelling, screaming and cussing at [name], speaking down to CSW in a very derogative manner.  CSW commended [name] in all his efforts to try and care for patient, validating the importance of patient having [name] in his life to advocate for  his health and well-being.   [name] stated, "I am trying to eat something and my hands are full, so you'll just have to call back later because I can't find a pen and a piece of paper to write down your number".  [name] went on to explain that patient would not understand CSW's message anyway, due to his recent Stroke.  CSW offered to call back and leave a message for patient on voicemail, trying to make it easier for [name].  CSW then encouraged [name] to have patient contact CSW when it is convenient for both of them, so that [name] can be available to answer any questions that patient may have pertaining to our conversation.  [name] yelled, "What the hell, just leave the damn message", then terminated the call.  CSW called back and left a HIPAA compliant message on voicemail for patient and is currently awaiting a return call.  CSW will make a third and final outreach attempt to patient within the next 3-4 business days, on Monday, May 28, 2019, if a return call is not received from patient in the meantime.   [name], BSW, MSW, LCSW  Licensed Education officer, environmental Health System  Mailing [place] [address] [place], [place] [address] Physical Address-[address], [place], [place] [address] Toll Free Main # [phone] Fax # [phone] Cell # [phone]  Office # [phone] [name]

## 2019-05-24 ENCOUNTER — Encounter: Payer: Self-pay | Admitting: *Deleted

## 2019-05-24 ENCOUNTER — Other Ambulatory Visit: Payer: Self-pay | Admitting: *Deleted

## 2019-05-24 NOTE — Patient Outreach (Signed)
[facility]) Care [name]  05/24/2019  [name] Jun 23, 1950 [id number]   CSW received an incoming call from [name], Nurse with [facility], [place] [name], while she was in the home visiting with patient.  Also present during the home visit were patient's friend/roommate, [name] and Pharmacist, [name].  CSW was able to perform the initial phone assessment on patient, as well as assess and assist with social work needs and services.  CSW introduced self, explained role and types of services provided through [place] [name] ([place] [name]).  CSW further explained to patient that CSW works with [name], [name], also with [name], and individual placing the referral for services.  CSW then explained the reason for the call, indicating that Ms. [name] thought that patient would benefit from social work services and resources to assist with arranging transportation, to and from physician appointments, addressing level of care concerns, encouraging patient to consider long-term care assisted living placement, assisting patient with completion of a Medicaid application, and providing patient with resources for in-home care services.  CSW obtained two HIPAA compliant identifiers from patient, which included patient's name and date of birth.  CSW is aware that patient recently discharged from [facility] [place] [place] where patient was receiving long-term care services, on Sunday, May 13, 2019.  Patient claims that [name] encouraged him to come and live with him, at least until patient's home renovations have been completed.  [name] admitted that it is just too difficult for him to try and manage patient's care, now requesting that CSW look into alternate placement arrangements for patient.  Patient indicated that he would be  willing to consider short-term assisted living placement, just long enough for him to gain more strength and mobility.  Patient would then like to be able to move back into his own home, currently under construction, with no scheduled end date.  CSW offered to assist patient with this process, explaining to patient that CSW will be mailing him a [name] [place] FL-2 Form, required for placement purposes, a Medicaid application, along with helpful Medicaid tips, as patient will need to apply for [place] Medicaid, through the [place], as well as a list of assisted living facilities, rest homes and family care homes in [place].     CSW will also mail patient a list of transportation resources, along with an application for public transportation through [employer] Paramedic), with the Department of Transportation.  Last, CSW will mail patient information and applications for PCS ([place]), through the [place] and CAPS (Community Alternative Program Services), through the [place], in the event that patient does not wish to receive assisted living placement.  In total, CSW will be mailing patient the following list of resources and applications:  [employer] FL-2 Form; Insurance underwriter; Medicaid Tips; List of Assisted Living Facilities in [place], [place]; [place] List of [place] for Seniors; [facility] ([place]) Instructions; PCS ([place]) Application; PCS ([place]) Providers; CAPS Forensic scientist) Instructions; CAPS Forensic scientist) Scientist, water quality for Seniors; Psychiatrist) Application  (Part's A and B).  CSW agreed to follow-up with patient  and [name] on Wednesday, May 30, 2019, around 9:30am, per their request, to ensure that they received the packet of resources mailed, as well as thoroughly review the information with them, to confirm understanding.  CSW explained to patient that CSW is more than happy to assist patient with completion of applications, as CSW is aware that patient is unable to read or write.  [name] with [facility] indicated that she would be contacting [name], patient's current home health agency, to request that patient receive a bath aid, at least as long as patient is receiving nursing, physical therapy and occupational therapy services in the home.  CSW encouraged patient to think about which assisted living facilities he may be interested in, while CSW makes contact with patient's Primary Care Physician, [name] to request a completed and signed FL-2 Form for patient.  Patient is aware that all assisted living facilities require a negative TB-Skin Test, within the last 90 days, or a negative Chest X-ray, within the last 12 months.  Patient will also be required to undergo a COVID-19 screening, within 3 days of admission into an assisted living facility, providing proof that he is negative of symptoms.     [name], BSW, MSW, LCSW  Licensed Education officer, environmental Health System  Mailing [place] [address], [place], [place] [address] Physical Address-[address], [place], [place] [address] Toll Free Main # [phone] Fax # [phone] Cell # [phone]  Office # [phone] [name]
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
## 2019-05-25 ENCOUNTER — Ambulatory Visit: Payer: Self-pay | Admitting: *Deleted

## 2019-05-28 ENCOUNTER — Ambulatory Visit: Payer: Self-pay | Admitting: *Deleted

## 2019-05-30 ENCOUNTER — Other Ambulatory Visit: Payer: Self-pay | Admitting: *Deleted

## 2019-05-30 NOTE — Patient Outreach (Signed)
[facility]) Care Management  05/30/2019  [name] May 19, 1950 [id number]  CSW received an incoming call from [name], Pharmacist with [facility], [place] Management, today to provide CSW with [name] private contact information.  [name] is patient's brother and payee, managing all of patient's affairs.  CSW was encouraged to communicate with [name] in order to obtain accurate information on patient.  CSW agreed to contact [name], just prior to contacting patient, to remind him of [name]'s home visit, scheduled with patient for today (Wednesday, May 30, 2019) at 10:00am.  CSW will also request [name] assistance with completing all of the following applications for patient:  Medicaid Application; PCS (Personal Care Services) Application; CAPS Forensic scientist) Application; SCAT Paramedic) Application (Part's A and B).  In talking with [name], [place] learned that patient "does not" have active Adult Medicaid, with the [place], or the [place].  Patient was eligible for Special Assistance Long-Term Care Medicaid, while residing at [facility], but coverage was terminated once patient discharged from the facility.  CSW inquired as to whether or not [name] would be willing to assist CSW with completing all of the above-named applications for patient, for which [name] agreed, but indicated that it may take him several weeks to do so, based on his current workload and obligations to his employer.  [name] encouraged CSW to contact the social worker at [facility] to inquire about most of these applications, reporting that he requested their assistance with completion, prior to patient being discharged from the facility.  CSW made an attempt to try and contact  [name] [phone]), Social Worker/Discharge Planner at [facility] to inquire, but she was unavailable at the time of CSW's call.  CSW left a HIPAA compliant message on voicemail for [name] and is currently awaiting a return call.  CSW was able to make contact with patient today to follow-up regarding social work services and resources, as well as to ensure that patient is still willing to have CSW assist him with placement into an assisted living facility.  Patient admitted to being agreeable to placement, but only until his home renovations have been completed, then he plans to return home to live independently.  CSW inquired about patient's ability to care for himself independently, encouraging patient to consider long-term care placement, versus receiving in-home care services through either PCS or CAPS, agreeing to assist patient with these applications to determine eligibility.    However, CSW explained to patient that he will first need to be approved for [place] Medicaid, for assisted living placement, then have his Medicaid case worker, through the [place], change his [place] Medicaid to Adult Medicaid, for PCS and CAPS.  CSW tried to confirm that patient received the packet of resource information and applications mailed to his home by CSW last week, on Thursday, May 24, 2019; however, patient denied receipt.  CSW provided patient with CSW's contact information, encouraging patient to contact CSW as soon as he receives the packet, as CSW would like to review the contents with patient, answering any questions that he may have pertaining to the information received.  CSW encouraged patient to review the list of [place] (enclosed in the packet), making a decision on where he would like  to reside.  In the meantime, CSW will begin completing patient's applications for Adult  Medicaid, PCS, CAPS and SCAT.  CSW will also request assistance from patient's Primary Care Physician, [name] in completing patient's [place] [facility] FL-2 Form.  CSW agreed to contact [name] to obtain income information on patient, required to complete all of the above applications to determine eligibility.  CSW will make arrangements to contact patient again next week, on Thursday, June 07, 2019, around 10:00am, if a return call is not received from patient in the meantime.  CSW will perform a thorough review of patient's EMR (Electronic Medical Record) in Epic to see if patient has had a TB Skin Test performed within the last 3 months, or undergone a Chest X-ray within the last year.  CSW had patient write down a lot of information that was discussed during the call today, wanting to confirm patient's understanding.  [name], BSW, MSW, LCSW  Licensed Education officer, environmental Health System  Mailing [place] [address] [place], [place] [address] Physical Address-[address], [place], [place] [address] Toll Free Main # [phone] Fax # [phone] Cell # [phone]  Office # [phone] [name]

## 2019-05-31 ENCOUNTER — Other Ambulatory Visit: Payer: Self-pay | Admitting: *Deleted

## 2019-05-31 ENCOUNTER — Other Ambulatory Visit: Payer: Self-pay

## 2019-05-31 NOTE — Patient Outreach (Signed)
[facility]) Care Management  05/31/2019  [name] 30-Nov-1950 [name]   Call placed to L. Apple, nurse with [facility], to follow up on member's status and collaborate on plan of care.  No answer, voice message left.  Message also left for J. Saporito, [facility] CSW, to collaborate on plan of care and best contact person as there has been conflicting information regarding who his caregiver/contact person is (roommate/friend Mr. Dye versus brother [name]).  Will follow up within the next 3-4 business days.  [name], [place], MSN [place] [phone]

## 2019-05-31 NOTE — Patient Outreach (Signed)
Telephone outreach to patient's caregiver to obtain mRS was successfully completed. MRS=3  CMA spoke with [name] (roommate). [name] shared his frustration with patients care team out side of the [place] system (homehealth). CMA shared where recent notes from [facility] LCSW as far as patient assistance were currently taking place and how follow up message would be sent per today's conversation.  In[facility] Management Assistant [phone]

## 2019-06-05 ENCOUNTER — Other Ambulatory Visit: Payer: Self-pay | Admitting: *Deleted

## 2019-06-05 NOTE — Patient Outreach (Signed)
[facility]) Care Management  06/05/2019  [name] 1951/02/08 [id number]   CSW received an incoming call from patient's friend/roommate, [name] today, indicating that patient has an appointment with [facility] on Thursday, June 07, 2019, at 9:00am, and needs a ride.  CSW agreed to make transportation arrangements for patient through [employer], at the expense of [employer], to and from patient's appointment.  [name] requested that he be able to attend the appointment with patient, which was approved through [employer].  CSW received confirmation from [name], Director of [employer], that transportation has been arranged and that they will be at patient's home at 8:30am, on Thursday, June 07, 2019, to transport him and Mr. Dye to his 9:00am appointment.  CSW explained all of this information to [name], in great detail, wanting to ensure understanding.    [name] confirmed receipt of the packet of resource information that CSW mailed to patient's home, admitting that he "does not know what to do with any of it, requesting assistance with completion".  CSW voiced understanding, explaining to Mr. Dye that CSW is aware that patient is unable to read or write and that CSW is completing all of the applications on patient's behalf.  CSW reminded Mr. Dye that pursuing placement for patient into an assisted living facility takes time, and that there is a process that needs to be followed.  [name] wanted CSW to be able to remove patient from his home today, verbalizing that he "no longer wants to be responsible for patient".  CSW further explained to Mr. Dye that CSW is still waiting on important information from patient's brother, [name], which will need to be submitted with patient's Adult Medicaid application for processing at the [place].  [name], BSW, MSW, LCSW  Licensed Education officer, environmental Health System  Mailing [place] [address], [place], [place] [address] Physical Address-[address], [place],  [address] Toll Free Main # [phone] Fax # [phone] Cell # [phone]  Office # [phone] [name]

## 2019-06-06 ENCOUNTER — Other Ambulatory Visit: Payer: Self-pay | Admitting: *Deleted

## 2019-06-06 NOTE — Patient Outreach (Signed)
[place] [facility]) Care Management  06/06/2019  [name] 1950/08/18 [id number]   Weekly transition of care call placed to [facility] roommate [name], he report nurse is visiting currently, unable to talk.  This care manager requested nurse to call once she was done with visit.    Update:  Call received back from L. Apple, nurse with [facility].  Discussed barriers to providing member with care and resources.  She confirms that even in person member's roommate has been a hindrance to member's recovery.  State he repeatedly interrupts the discuss between the nurse and the member, continuously saying he is overwhelmed and need assistance caring for the member.  She report member has agreed to be placed for short term but know his plan is to eventually go back home.  Ms. Phill Myron report member's current condition will not allow him to be independent, showing slow improvement.  Home Health is no longer in the home, she is advised that per [name], he told them not to come back.  She is aware that CSW (J. Saporito) has been working to have member placed but is having issues with obtaining information from his brother.  Will plan to discuss member's case with leadership team for advice.  [name], [place], MSN [place] [phone]

## 2019-06-07 ENCOUNTER — Other Ambulatory Visit: Payer: Self-pay | Admitting: *Deleted

## 2019-06-07 DIAGNOSIS — R1319 Other dysphagia: Secondary | ICD-10-CM | POA: Diagnosis not present

## 2019-06-07 NOTE — Patient Outreach (Signed)
[facility]) Care Management  06/07/2019  JUANPABLO ROSSMILLER [birth year] [id number]   CSW was able to make contact with patient's friend/roommate, [name] to confirm that he and patient made it to patient's appointment with [facility] this morning at 9:00am.  CSW was able to make transportation arrangements for patient to this appointment, through Transportation Services with [place] attempted to contact patient's brother, payee and [place] of [place], [name] this morning to inquire about his willingness to assist CSW with completing patient's Adult Medicaid application, through the [place]; however, [name] was not available at the time of CSW's call.  CSW left a HIPAA compliant message on voicemail for [name], and is currently awaiting a return call.  CSW also sent a text message to [name], as well as an email message, requesting his assistance.  CSW has already completed patient's Adult Medicaid application, to the best of her ability, requiring Mr. Greene's assistance in filling in patient's income and asset information, prior to being able to submit patient's application for processing.  CSW will continue to try and outreach to [name].  CSW is unable to proceed with pursing assisted living placement for patient until his Adult Medicaid application has been processed and patient has been approved, as patient does not currently have a payor source and is unable to private pay for assisted living placement.  CSW continues to try and explain this information to [name], knowing that he is extremely anxious to get patient out of his home, relieving him of the obligation to care for patient.  CSW has agreed to follow-up with patient and [name] as soon as a response is received from [name] and CSW has information to report.  [name], BSW, MSW, LCSW  Licensed Regulatory affairs officer Health System  Mailing [place] [address] [place], [place] [address] Physical Address-[address], [place], [place] [address] Toll Free Main # [phone] Fax # [phone] Cell # [phone]  Office # [phone] [name]

## 2019-06-08 ENCOUNTER — Other Ambulatory Visit: Payer: Self-pay | Admitting: *Deleted
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
## 2019-06-11 ENCOUNTER — Ambulatory Visit: Payer: Self-pay | Admitting: *Deleted

## 2019-06-13 ENCOUNTER — Ambulatory Visit: Payer: Self-pay | Admitting: *Deleted

## 2019-06-14 ENCOUNTER — Other Ambulatory Visit: Payer: Self-pay | Admitting: *Deleted

## 2019-06-14 NOTE — Patient Outreach (Signed)
[facility]) Care Management  06/14/2019  ARLOW COACHMAN 04/22/1950 [id number]   Call placed to L. Apple with [facility] to follow up on member's status and living situation.  No answer, HIPAA compliant voice message left.  Will await call back, if no call back will follow up within the next 2 business days.  [name], [place], MSN [place] [phone]

## 2019-06-15 ENCOUNTER — Other Ambulatory Visit: Payer: Self-pay | Admitting: *Deleted

## 2019-06-15 NOTE — Patient Outreach (Signed)
[place] [facility]) Care Management  06/15/2019  [name] 04/04/1950 [id number]  CSW was able to make brief contact with patient today to obtain verbal consent to converse with his friend/roommate, [name], as well as his brother/payee/healthcare power of attorney, [name].  Patient then handed the phone to [name], explaining that [name] is currently speaking with all of his care team members on his behalf.  In talking with [name], he appeared to be a lot more relaxed today, and a lot less agitated, admitting that he and patient has been getting along well for the past week.  [name] stated, "I think [name] is finally getting used to living here".  CSW explained to [name] that CSW continues to try and make contact with [name], via phone, email and text, without success.  CSW has left several HIPAA compliant messages for [name], requesting to obtain patient's financial information, so that CSW can submit patient's Adult Medicaid application to the [place] for processing.  [name] admitted to calling Adult Protective Services, also with the [place], last week to file a claim against patient.  [name] went on to say that he spoke with an Adult Materials engineer by the name of [name] [phone]), and that a formal investigation has been launched.  [name] reported that he provided Ms. [name] with CSW's contact information, encouraging her to contact CSW directly if she needs additional information to process the claim and/or substantiate the case.  [name] explained to [name] that patient needs emergency housing, admitting that he is no longer able to properly care for patient in the home.  [name] denied the desire to ever harm, neglect or exploit patient, in any way, he just felt that patient's medical needs were out of his scope of practice, concerned that he may  unintentionally harm patient, especially when patient refuses to take his prescription medications.   CSW learned that [name] actually owns the home in which patient and [name] currently reside, agreeing to cut the rent payment in half, as long as [name] is willing to care for patient in the home.  In addition, [name] agreed to provide [name] with a vehicle so that he can transport patient to and from all of his physician appointments.  [name] went on to explain that [name] was prepared to provide them with a cadillac, but patient refused, adamant about [name] providing them with a truck.  At present, patient and [name] due to not have a vehicle or means of transportation.  CSW encouraged patient and [name] to contact CSW when patient is in need of transportation, as CSW will make arrangements through Transportation Services with [name] agreed to follow-up with patient, [name] and [name] again in one week, on Monday, Jun 25, 2019, around 9:00am, if a return call is not received from one of them in the meantime.   [name], BSW, MSW, LCSW  Licensed Education officer, environmental Health System  Mailing [place] [address] [place], [place] [address] Physical Address-[address], [place], [place] [address] Toll Free Main # [phone] Fax # [phone] Cell # [phone]  Office # [phone] [name]

## 2019-06-18 ENCOUNTER — Other Ambulatory Visit: Payer: Self-pay | Admitting: *Deleted

## 2019-06-18 NOTE — Patient Outreach (Signed)
[place] [facility]) Care Management  06/18/2019  [name] [birth year] [id number]   Call placed to member's caregiver Shanon Brow to follow up on member's condition, no answer.  HIPAA compliant voice message left.  Call also placed to [facility] nurse, L. Apple, no answer, voice message left.  Will follow up with both within the next 3-4 business days.  [name], [place], MSN [place] [phone]

## 2019-06-19 ENCOUNTER — Other Ambulatory Visit: Payer: Self-pay | Admitting: *Deleted

## 2019-06-19 NOTE — Patient Outreach (Signed)
[place] [facility]) Care Management  06/19/2019  [name] 1950-09-04 [id number]   Call received back from L. Apple, nurse with [facility].  Report member is still being monitored in the home, denies he has made any significant improvement.  State he has lost a few pounds since being discharged but is now drinking Ensure a couple times a day.  He was hoping to have his feeding tube removed, went to the general surgeon's office to have it taken out but it was not removed due to concern of needing it placed again.  Report he is taking medications better since last week with the pharmacist made home visit.  His caregiver is still wanting him placed as soon as possible, member is wanting to go to his own home.  Ms. Phill Myron report per her assessment, he is not able to care for himself independently nor does he have the resources in the home to stay in the home (no stove, furniture, home needs work, Social research officer, government).  She is made aware that CSW has been working on contact with [name] to submit member's financial information to complete application.  She will also make an attempt to contact him and ask him to call CSW back.    Will discuss member's case again with interdisciplinary team on 5/13.  Will follow up with member's caregiver and/or [facility] within the next week.  [name], [place], MSN [place] [phone]

## 2019-06-21 ENCOUNTER — Other Ambulatory Visit: Payer: Self-pay | Admitting: *Deleted

## 2019-06-21 NOTE — Patient Outreach (Signed)
[place] [facility]) Care Management  06/21/2019  [name] [birth year] [id number]   Outreach attempt #2, successful.  Call place to [facility] caregiver Shanon Brow to follow up on member's status.  He report he is eating lunch, this care manager offered to call him back, he declined and said "go ahead, what you need."  Once this care manager attempted to ask questions related to member's condition, Mr. Shanon Brow stated "I can't focus on anything but eating right now.  I'll call you back."  Will await call back.  Case also discussed this morning with multidisciplinary team, will continue to collaborate with [facility].  CSW will continue to work on outreach attempts to [employer] payee to discuss Medicaid application and placement.  [name], [place], MSN [place] [phone]

## 2019-06-25 ENCOUNTER — Other Ambulatory Visit: Payer: Self-pay | Admitting: *Deleted

## 2019-06-25 NOTE — Patient Outreach (Signed)
[place] [facility]) Care Management  06/25/2019  TAFT CONROW 1950-03-24 [id number]   CSW received a return call from patient's brother, [name] today, in response to the HIPAA compliant messages, emails and texts that CSW has left for Mr. Greene over the past several weeks.  [name] reported that he received a call from an Adult Protective Services Case Worker, with the [place], by the name of Ms. London Pepper [phone]), indicating that patient's case is currently under investigation.  [name] went on to say that Ms. [place] Pepper is concerned that patient is being neglected and that his money and benefits are being exploited.  [name] was somewhat defensive, wanting to know why anyone would request guardianship of patient, making him a ward of the state.  In addition, [name] stated, "I am doing everything in my power to keep [name] from being institutionalized for the remainder of his life, something I know he would definitely not want".  [name] further stated, "I make sure [name] has his medications and I have my daughter deliver groceries once a week".  CSW was already aware of patient's friend/roommate, [name] placing the Adult Protective Services referral to the [place], but did not offer up this information to [name], nor did CSW elude as to where the referral came from, not wanting to cause any friction in the home between [name] and patient.  [name] reported that he has almost finished completing patient's application for Adult Medicaid, and plans to submit the application to the [place] tomorrow, Tuesday, Jun 26, 2019, for processing.  CSW encouraged [name] to please follow-up with CSW as soon as an approval/denial letter is received from patient's assigned Medicaid case worker, so that [place] may begin the process of pursuing long-term  care placement for patient into an assisted living facility of choice.  Patient, [name] and [name] have all been given a complete list of assisted living facilities in [facility] for their review and consideration.  CSW was able to make contact with [name] today to follow-up regarding social work services and resources for patient.  [name] reported that patient is definitely talking a lot clearer, that you can actually decipher what he is saying.  [name] stated that patient is drinking from the case of Ensure provided to him by Deanne Coffer, Pharmacist with [facility], also with [facility], and that he actually enjoys the supplements.  [name] further reported that patient is also taking his medications exactly as prescribed, but is still somewhat reluctant to shower and bath.  [name] indicated that he and patient are still waiting for [name] to provide them with a vehicle for transportation, as [name] expects [name] to transport patient to and from all of his physician appointments.  CSW agreed to contact patient and [name] again next week, on Thursday, Jul 05, 2019, around 9:00am, to follow-up regarding social work services and resources for patient, as well as to assess patient's mental health status.  [name], BSW, MSW, LCSW  Licensed Education officer, environmental Health System  Mailing [place] [address] [place], [place] [address] Physical Address-[address], [place], [place] [address] Toll Free Main # [phone] Fax # [phone] Cell # [phone]  Office # [phone] [name]

## 2019-07-05 ENCOUNTER — Other Ambulatory Visit: Payer: Self-pay | Admitting: *Deleted

## 2019-07-05 NOTE — Patient Outreach (Signed)
[place] [facility]) Care Management  07/05/2019  [name] 01/16/1951 [id number]   CSW was able to make contact with patient's friend and roommate, [name] today to follow-up regarding social work services and resources for patient.  [name] started off the conversation by stating, "We're having some problems with [name]".  [name] went on to say that patient got into a disagreement with his brother, [name], about transportation, after learning that [name] sold patient's truck.  [name] reported that he and patient currently have no means of transportation; "although, [name] is relying on me to transport [name] to and from all of his physician appointments".  [name] stated, "[name] has been talking about replacing the vehicle for several months, so you can imagine how frustrated [name] and I are about feeling stranded".  CSW offered attentive listening, empathy and supportive services.  CSW explained to Mr. Dye that [place] continues to try and make contact with [name] to follow-up with him regarding patient's application for Adult Medicaid, and whether or not he has had an opportunity to finish completing it and submit it to the [place] for processing; however, [name] is not available at the time of CSW's calls, nor has [name] returned CSW's voicemail messages, text messages or emails.  [name] stated, "I don't think [name] has any intentions of [name] leaving my home, not wanting him to be placed into a facility".  Once again, CSW explained to Mr. Dye that CSW is unable to submit patient's application for Adult Medicaid without providing proof of income, 3 months worth of bank statements, last 2 copies of patient's Social Security Income, Social research officer, government.  [name] then needed to terminate the call, admitting that he was getting ready to leave to attend a doctors appointment, as his ride had just pulled into the driveway.  CSW  encouraged Mr. Dye to call CSW back if there was additional information that needed to be addressed/discussed; otherwise, CSW agreed to follow-up with patient and [name] again in two weeks, on Thursday, July 19, 2019, around 9:00am.  CSW also agreed to contact patient and [name] if CSW received a response from [name], in the meantime.  In that case, CSW will contact patient and Mr. Dye directly following CSW's conversation with [name], to report findings.  [name] voiced understanding and was agreeable to this plan, encouraging CSW to continue to try and make contact with [name].  [name], BSW, MSW, LCSW  Licensed Education officer, environmental Health System  Mailing [place] [address], [place], [place] [address] Physical Address-[address], [place], [place] [address] Toll Free Main # [phone] Fax # [phone] Cell # [phone]  Office # [phone] [name]

## 2019-07-05 NOTE — Patient Outreach (Signed)
[facility]) Care Management  07/05/2019  MALIQUE DEBELLIS [birth year] [id number]   Call placed to [facility] caregiver Mr. Dye to follow up on member's recovery.  He report he is still frustrated with having to manage member's care and is eager to have him placed.  State member is leaving the home almost daily and going down the street to his other home.  He is still not taking medications as he should, member can be heard in the background fussing, stating "tell them why."  [name] does not place member on the phone but instead goes into another room to talk.  He is advised that CSW is still waiting on financial information from [name], member's payee, in order to process Medicaid application and have member placed.  He again becomes frustrated and state "you know he's doing this on purpose."    Redirected Mr. [name] conversation back to care of the member.  Inquired about member's appointment with PCP.  He state he is not sure if he is still active with listed provider.  Offered to make conference call to schedule appointment, he state he does not have any more time to talk as he is about to shower, call ended.  This care manager reached out to PCP office to follow up on activity with clinic.  Notified that member has not seen provider in the office since June 2018 but is still listed as active.  Attempted to schedule visit but scheduling assistant wasn't available.  Detailed message left to contact this care manager and/or Mr. Dye to schedule visit.  Will follow up with [name] within the next 2 weeks.  THN CM Care Plan Problem One     Most Recent Value  Care Plan Problem One  Risk for readmission related to improper level of care  Role Documenting the Problem One  Care Management [place] for Problem One  Active  [facility] Long Term Goal   Member will be placed in ALF/increased level of care within the next 45 days  THN Long Term Goal Start Date  07/05/19   Interventions for Problem One Long Term Goal  Caregiver/roommate updated on process of Medicaid application. Collaboration with CSW to provide member with increased level of care  THN CM Short Term Goal #1   Member will have appointment scheuled with PCP within the next 4 weeks  THN CM Short Term Goal #1 Start Date  07/05/19  Interventions for Short Term Goal #1  Call placed to PCP office to request post SNF appointment  [facility] CM Short Term Goal #2   Member and caregiver will report continued involvement and home visits with [facility] over the next 4 weeks  THN CM Short Term Goal #2 Start Date  07/05/19  Interventions for Short Term Goal #2  Collaborate with [facility] on plan of care.  Caregiver educated on importance of allowing in home support as much as possible     [name], Therapist, sports, MSN [place] [phone]

## 2019-07-16 ENCOUNTER — Other Ambulatory Visit: Payer: Self-pay | Admitting: *Deleted

## 2019-07-16 NOTE — Patient Outreach (Signed)
[place] [facility]) Care Management  07/16/2019  [name] [birth year] [id number]   Message received from listed PCP office stating that member is no longer active with office.  He will need to schedule appointment with new provider.  Call placed to caregiver [name], he report member has been better over the last couple weeks.  State he has been taking his medications and interacting more with another family friend.  He is made aware that member is no longer active with [name] and that he will need now MD.  He verbalizes understanding but state he is not sure how to set this up, advised that this care manager will do so for him.  He then expresses concern regarding transportation, advised that once appointment is scheduled CSW will help secure transportation.  State he would like to discuss member's complaints of intermittent back pain with provider, report he drink several sodas daily, not much water.  [name] report that he has been having trouble with his gall bladder, will have appointment tomorrow.  He is expecting to have a surgery date, state he will not be able to care for member during this time.  He is eager to discuss the plan of care while he is in the hospital recovering and when he get home.  Advised that this care manager will contact BenjaminBrooks to discuss plan.  He denies any urgent concerns, will follow up with member within the next 2 weeks.  Call placed to [name], she is aware that [name] will possibly have surgery.  She is planning to meet with them both on Wednesday, will follow up with [name] and develop plan of care at that time.  She will keep this care manager updated on plan.  THN CM Care Plan Problem One     Most Recent Value  Care Plan Problem One  Risk for readmission related to improper level of care  Role Documenting the Problem One  Care Management [place] for Problem One  Active  [facility] Long Term Goal   Member will be placed in  ALF/increased level of care within the next 45 days  THN Long Term Goal Start Date  07/05/19  Interventions for Problem One Long Term Goal  Caregiver educated on importance of medication management and adequate support in the home to decrease risk of readmission  THN CM Short Term Goal #1   Member will have appointment scheuled with PCP within the next 4 weeks  THN CM Short Term Goal #1 Start Date  07/05/19  Interventions for Short Term Goal #1  Member connected to new PCP, call placed to schedule appointment.  THN CM Short Term Goal #2   Member and caregiver will report continued involvement and home visits with [facility] over the next 4 weeks  THN CM Short Term Goal #2 Start Date  07/05/19  Interventions for Short Term Goal #2  Collaborated with [facility], advised of plan to establish member with new PCP as well as care for member while caregiver is not available     [name], Therapist, sports, MSN [place] Manager [phone]

## 2019-07-19 ENCOUNTER — Other Ambulatory Visit: Payer: Self-pay | Admitting: *Deleted

## 2019-07-19 NOTE — Patient Outreach (Signed)
[name]) Care Management  07/19/2019  [name] 05-26-1950 [id number]   CSW was able to make contact with patient and patient's friend/roommate, [name] today to follow-up regarding social work services and resources.  CSW was only able to speak with patient briefly, as he was leaving the house to help a family friend work on [employer].  [name] reported that patient has been doing very well, taking his medications exactly as prescribed, trying to eat a healthier diet and drink more water and definitely getting plenty of exercise.  Although, patient is still smoking several cigarettes per day, refusing to quit or even consider Smoking Cessation Classes.  [name] also reported that he and patient are getting along a lot better, now that they have gotten into a routine.  CSW left another HIPAA compliant message for patient's brother/healthcare power of attorney, [name], encouraging him to contact CSW to discuss patient's application for Adult Medicaid.  CSW also explained in her message to [name] that patient and [name] are in desperate need of transportation, as [name] has been promising to replace patient's truck, the one that he sold while patient was residing in the assisted living facility, for the past several months.  CSW encouraged patient and Mr. Dye to contact CSW if patient is in need of transportation to and from his physician appointments, as CSW is more than happy to assist with arrangements.  Patient still needs to get established with a new primary care physician.  [name], Nurse with [facility], through [facility], is aware that Mr. Dye may possibly be having gall bladder surgery and unable to care for patient during his recovery period.  Mrs. Apple, along with [name], patient's RNCM, also with [facility], are in constant communication with one another and will develop a plan  of care, prior to Mr. [name] surgery.  CSW agreed to follow-up with patient and [name] again in two weeks, on Thursday, August 02, 2019, around 9:00am, to inquire about Mr. [name] scheduled surgery date.  CSW will also continue to try and make contact with [name] regarding patient's Adult Medicaid application, as well as transportation.  [name], BSW, MSW, LCSW  Licensed Education officer, environmental Health System  Mailing [name], [place], [place] [address] Physical Address-[address], [place], [place] [address] Toll Free Main # [phone] Fax # [phone] Cell # [phone]  Office # [phone] [name]

## 2019-07-26 ENCOUNTER — Ambulatory Visit: Payer: Medicare Other | Admitting: *Deleted

## 2019-07-30 ENCOUNTER — Other Ambulatory Visit: Payer: Self-pay | Admitting: *Deleted

## 2019-07-30 ENCOUNTER — Encounter: Payer: Self-pay | Admitting: *Deleted

## 2019-07-30 NOTE — Patient Outreach (Signed)
[name]) Care Management  07/30/2019  [name] 1950-08-21 [id number]   CSW was able to make contact with patient and patient's friend/roommate, [name] today to explain to them that CSW would be closing patient's case.  CSW went on to explain that CSW is unable to provide needed services to patient, without the assistance of patient's brother, payee and Healthcare Power of [name].  CSW has not been able to maintain contact with [name], despite numerous attempts, to obtain financial information necessary to complete patient's Adult Medicaid application.    Without Adult Medicaid, patient is not eligible to receive [employer] Forensic scientist), through the [place], or [employer] ([place]), through the [place], as these services are only available to Adult Medicaid recipients.  Patient is on a very fixed income, unable to afford to pay for in-home care services out-of-pocket.  In addition, CSW is unable to assist patient with pursing placement into an assisted living facility, because without Adult Medicaid, patient does not have a payor source.  CSW left a HIPAA compliant message on voicemail for [name], explaining that CSW would be closing patient's case, encouraging him to contact CSW directly if he has questions and/or concerns, providing him with CSW's contact information.  CSW will notify patient's RNCM with [facility], [name] of CSW's plans to close patient's case.  CSW will fax an update to patient's Primary Care Physician, [name] to ensure that he is aware of CSW's attempt at involvement with patient's plan of care.    [name], BSW, MSW, LCSW  Licensed Education officer, [facility] System  Mailing [place] [address] [place],  [place] [address] Physical Address-[address], [place], [place] [address] Toll Free Main # [phone] Fax # [phone] Cell # [phone]  Office # [phone] [name]

## 2019-08-01 ENCOUNTER — Emergency Department (HOSPITAL_COMMUNITY)
Admission: EM | Admit: 2019-08-01 | Discharge: 2019-08-01 | Disposition: A | Discharge status: Home / Self Care | Payer: Medicare Other | Attending: Emergency Medicine | Admitting: Emergency Medicine

## 2019-08-01 ENCOUNTER — Encounter (HOSPITAL_COMMUNITY): Payer: Self-pay | Admitting: Emergency Medicine

## 2019-08-01 ENCOUNTER — Other Ambulatory Visit: Payer: Self-pay | Admitting: *Deleted

## 2019-08-01 ENCOUNTER — Other Ambulatory Visit: Payer: Self-pay

## 2019-08-01 DIAGNOSIS — R4182 Altered mental status, unspecified: Secondary | ICD-10-CM | POA: Diagnosis present

## 2019-08-01 DIAGNOSIS — Z8673 Personal history of transient ischemic attack (TIA), and cerebral infarction without residual deficits: Secondary | ICD-10-CM

## 2019-08-01 DIAGNOSIS — I69328 Other speech and language deficits following cerebral infarction: Secondary | ICD-10-CM | POA: Insufficient documentation

## 2019-08-01 DIAGNOSIS — I482 Chronic atrial fibrillation, unspecified: Secondary | ICD-10-CM | POA: Diagnosis not present

## 2019-08-01 DIAGNOSIS — I10 Essential (primary) hypertension: Secondary | ICD-10-CM | POA: Diagnosis not present

## 2019-08-01 DIAGNOSIS — F17210 Nicotine dependence, cigarettes, uncomplicated: Secondary | ICD-10-CM | POA: Diagnosis not present

## 2019-08-01 NOTE — ED Provider Notes (Signed)
[facility] Emergency Department Provider Note MRN:  [id number]  [place] date & time: 08/01/19     Chief Complaint   Altered Mental Status   History of Present Illness   [name] is a 69 y.o. year-old male with a history of stroke, AAA presenting to the ED with chief complaint of altered mental status.  Patient picked up on the side of the road by EMS, walking on the side of the road.  Speech impaired, brought to the emergency department.  Patient explains that he is recovering from a stroke back in December and he has had issues speaking since that time.  He wanted to walk home today and he is frustrated that he was brought to the emergency department.  He denies any pain, no new numbness or weakness, no change to his speech, no new confusion.  No recent fever or cough, no dysuria.  I spoke with the his friend [name] who confirms the story that he became frustrated with his roommate and wanted to walk.  He is currently at his baseline.  Review of Systems  A complete 10 system review of systems was obtained and all systems are negative except as noted in the HPI and PMH.   Patient's Health History    Past Medical History:  Diagnosis Date  . AAA (abdominal aortic aneurysm) ([place])    per last CT 03-26-2016---  3.9cm  . Anticoagulated on Coumadin   . Arthritis of spine   . Benign localized prostatic hyperplasia with lower urinary tract symptoms (LUTS)   . Chronic atrial fibrillation ([place]) cardiolgoist -- dr Stanford Breed   dx 2009  . Complication of anesthesia    per pt today (07-08-2016) had problem after having anesthesia 12/ 2016 surgery [facility] "not good" went to ED ( in epic ED visit 01/ 2017 dx severe episode of major depression disorder and admitted to [facility]  . COPD with emphysema ([place])   . Dyspnea on exertion   . Full dentures   . GERD (gastroesophageal reflux disease)   . Hematuria   . History of amaurosis fugax    right eye 02-08-2015 --  resolved /  documented in epic possible left eye amaurosis fugax 11/ 2008 (pt denies) / per last MRI show previous bilateral occipital lobe infarcts  . History of concussion    as child  . History of CVA (cerebrovascular accident) 10/ 2016  and per pt Dec 2017 in [place]   neurologist-  dr Leonie Man-- per note silent infarcts on MRI-- multiple acute infarcts bilateral cerebullm, right temporal lobe, and bilateral occipital lobes due to embolic phenomena (atrial fib.)  . History of seizures as a child   . Hyperlipidemia   . Hypertension   . Left arm numbness    occasional numbess post residual axilla arterial occlusion and surgery  . Major depressive disorder    hx severe episode without psychosis 02/2015 w/ homicidal ideation  . Narcotic dependence ([place])   . PAD (peripheral artery disease) ([place])    followed by dr fields-- s/p  left axilla to branchial and left axilla to ulnar bypass graft due to ischemic hand  . Pulmonary nodule    per CT 03-26-2016  right lower lobe  . PVD (peripheral vascular disease) ([place])   . Right ureteral stone   . Senile purpura ([place])   . Weakness of left arm    residual from axilla arterial occlusion and post surgery  . Wears glasses  Past Surgical History:  Procedure Laterality Date  . ARCH AORTOGRAM N/A 04/05/2014   Procedure: ARCH AORTOGRAM, FIRST ORDER CATHETERIZATION LEFT SUBCLAVIAN ARTERY;  Surgeon: Elam Dutch, MD;  Location[facility] OR;  Service: Vascular;  Laterality: N/A;  . AXILLARY-FEMORAL BYPASS GRAFT Left 04/08/2014   Procedure: LEFT AXILLARY ARTERY TO BRACHIAL ARTERY BYPASS USING NON REVERSE LEFT GREATER SAPHENOUS VEIN ,LIGATION OF LEFT AXILLARY ARTERY ANEURYSM;  Surgeon: Elam Dutch, MD;  Location: [facility];  Service: Vascular;  Laterality: Left;  . BYPASS AXILLA/BRACHIAL ARTERY Left 01/27/2015   Procedure: LEFT BRACHIAL-ULAR ARTERY BYPASS USING GREATER SAPHENOUS VEIN;  Surgeon: Elam Dutch, MD;  Location: [place];  Service: Vascular;   Laterality: Left;  . CYSTOSCOPY WITH RETROGRADE PYELOGRAM, URETEROSCOPY AND STENT PLACEMENT Right 07/17/2016   Procedure: CYSTOSCOPY WITH RETROGRADE PYELOGRAM, URETEROSCOPY AND STENT PLACEMENT,LASER;  Surgeon: Festus Aloe, MD;  Location: [facility];  Service: Urology;  Laterality: Right;  . CYSTOSCOPY/URETEROSCOPY/HOLMIUM LASER/STENT PLACEMENT Right 07/13/2016   Procedure: CYSTOSCOPY, RIGHT URETEROSCOPY RETROGRADE PYELOGRAM;  Surgeon: Kathie Rhodes, MD;  [facility];  Service: Urology;  Laterality: Right;  . EMBOLECTOMY Left 04/05/2014   Procedure: THROMBO ENDARTERECTOMY OF LEFT BRACHIAL, RADIAL AND ULNAR ARTERY,  Left Radial artery cut down and radial artery thrombectomy.;  Surgeon: Elam Dutch, MD;  Location: [facility] OR;  Service: Vascular;  Laterality: Left;  . ESOPHAGOGASTRODUODENOSCOPY N/A 01/30/2019   Procedure: ESOPHAGOGASTRODUODENOSCOPY (EGD);  Surgeon: Jesusita Oka, MD;  Location: [facility];  Service: General;  Laterality: N/A;  . facial cyst Right    cyst removal.   . IR REPLC GASTRO/COLONIC TUBE PERCUT W/FLUORO  02/17/2019  . PATCH ANGIOPLASTY Left 04/05/2014   Procedure: LEFT ARM VEIN PATCH ANGIOPLASTY;  Surgeon: Elam Dutch, MD;  Location: [place];  Service: Vascular;  Laterality: Left;  . PEG PLACEMENT N/A 01/30/2019   Procedure: PERCUTANEOUS ENDOSCOPIC GASTROSTOMY (PEG) PLACEMENT;  Surgeon: Jesusita Oka, MD;  Location: [place];  Service: General;  Laterality: N/A;  . PERIPHERAL VASCULAR CATHETERIZATION N/A 01/22/2015   Procedure: Aortic Arch Angiography;  Surgeon: Elam Dutch, MD;  Location: [facility] CV LAB;  Service: Cardiovascular;  Laterality: N/A;  . THROMBECTOMY BRACHIAL ARTERY Left 11/20/2014   Procedure: 1.  Thrombectomy Left Axilo-Brachial Bypass  2.  Thromboendarterecotmy of Left Brachial Artery with Fogarty Thrombectomy of Radial and Ulnar Arteries with Dacron patch angioplasty Left Brachial Artery. 3. Intraoperative   Arteriogram times four.;  Surgeon: Mal Misty, MD;  Location: [facility];  Service: Vascular;  Laterality: Left;  . TRANSTHORACIC ECHOCARDIOGRAM  11/25/2014   moderate concentric LVH, ef 60-65%, unable to evaluation LVDF due to atrial fib/  mild MR/  severe LAE  . VEIN HARVEST Left 04/08/2014   Procedure: LEFT GREATER SAPHENOUS VEIN HARVEST;  Surgeon: Elam Dutch, MD;  Location: [place];  Service: Vascular;  Laterality: Left;  Marland Kitchen VEIN HARVEST Right 01/27/2015   Procedure: RIGHT GREATER SAPHENOUS VEIN HARVEST;  Surgeon: Elam Dutch, MD;  Location: [place];  Service: Vascular;  Laterality: Right;    Family History  Problem Relation Age of Onset  . Hypertension Mother   . Lung cancer Mother   . Stomach cancer Father        died age 76  . Cancer Sister   . Early death Neg Hx   . Heart disease Neg Hx   . Hyperlipidemia Neg Hx   . Kidney disease Neg Hx   . Stroke Neg Hx     Social  History   Socioeconomic History  . Marital status: Divorced    Spouse name: Not on file  . Number of children: 1  . Years of education: 8  . Highest education level: 8th grade  Occupational History  . 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 Smoking status: Current Every Day Smoker    Packs/day: 2.00    Years: 51.00    Pack years: 102.00    Types: Cigarettes  . Smokeless tobacco: Never Used  . Tobacco comment: per pt 07-08-2016 down to 2ppd from 4 ppd (quit one time for 4 years since started at age 6)  41 Use  . Vaping Use: Never used  Substance and Sexual Activity  . Alcohol use: Yes    Alcohol/week: 0.0 standard drinks    Comment: occasional   . Drug use: No    Comment: per pt 07-08-2016 hx drug use stopped 1970's  . Sexual activity: Not Currently  Other Topics Concern  . Not on file  Social History Narrative  . Not on file   Social Determinants of Health   Financial Resource Strain: Medium Risk  . Difficulty of Paying Living Expenses: Somewhat  hard  Food Insecurity: No Food Insecurity  . Worried About Charity fundraiser in the Last Year: Never true  . Ran Out of Food in the Last Year: Never true  Transportation Needs: Unmet Transportation Needs  . Lack of Transportation (Medical): Yes  . Lack of Transportation (Non-Medical): No  Physical Activity: Inactive  . Days of Exercise per Week: 0 days  . Minutes of Exercise per Session: 0 min  Stress: Stress Concern Present  . Feeling of Stress : To some extent  Social Connections: Socially Isolated  . Frequency of Communication with Friends and Family: More than three times a week  . Frequency of Social Gatherings with Friends and Family: More than three times a week  . Attends Religious Services: Never  . Active Member of Clubs or Organizations: No  . Attends Archivist Meetings: Never  . Marital Status: Divorced  Human resources officer Violence: Not At Risk  . Fear of Current or Ex-Partner: No  . Emotionally Abused: No  . Physically Abused: No  . Sexually Abused: No     Physical Exam   Vitals:   08/01/19 2117 08/01/19 2200  BP: (!) 160/145 (!) 155/98  Pulse: 60 62  Resp: 14   Temp:  98.9 F (37.2 C)  SpO2: 100%     CONSTITUTIONAL: Chronically ill-appearing, NAD NEURO:  Alert and oriented to name, normal and symmetric strength and sensation, normal coordination, moderate expressive aphasia EYES:  eyes equal and reactive ENT/NECK:  no LAD, no JVD CARDIO: Regular rate, well-perfused, normal S1 and S2 PULM:  CTAB no wheezing or rhonchi GI/GU:  normal bowel sounds, non-distended, non-tender MSK/SPINE:  No gross deformities, no edema SKIN:  no rash, atraumatic PSYCH:  Appropriate speech and behavior  *Additional and/or pertinent findings included in MDM below  Diagnostic and Interventional Summary    EKG Interpretation  Date/Time:  Wednesday August 01 2019 21:19:18 EDT Ventricular Rate:  60 PR Interval:    QRS Duration: 104 QT Interval:  486 QTC  Calculation: 486 R Axis:   -46 Text Interpretation: Atrial fibrillation LAD, consider left anterior fascicular block Borderline ST depression, diffuse leads Borderline prolonged QT interval Confirmed by Gerlene Fee [phone]) on 08/01/2019 9:35:05 PM      Labs Reviewed - No data to display  No orders to display    Medications - No data to display   Procedures  /  Critical Care Procedures  ED Course and Medical Decision Making  I have reviewed the triage vital signs, the nursing notes, and pertinent available records from the EMR.  Listed above are laboratory and imaging tests that I personally ordered, reviewed, and interpreted and then considered in my medical decision making (see below for details).      Patient seems to be at his baseline with no complaints, was picked up by EMS because he was walking on the side of the road and he has speech impairment, but this has been present since December.  He is only oriented to name but his friend [name] explains that he is still in recovery from his stroke and that his memory is still impaired as well.  I will call his roommate as well to gain further collateral, but at this time I see no indication for testing in the emergency department.  His speech is impaired but he appears to have capacity to make decisions and his friend [name] agrees with that sentiment, stating that he is a grown man and he is allowed to walk on the side of the road if he chooses to.  I spoke with patient's roommate who also confirms the story and confirms the patient is at his baseline, remains name is [name] and he has come to pick the patient up and take him home.  [name]. [name], [place] [email or web address]  Final Clinical Impressions(s) / ED Diagnoses     ICD-10-CM   1. History of stroke  Z15.73     ED Discharge Orders    None       Discharge Instructions Discussed with and Provided to  Patient:     Discharge Instructions     You were evaluated in the Emergency Department and after careful evaluation, we did not find any emergent condition requiring admission or further testing in the hospital.  Your exam/testing today is overall reassuring.  You can call our social workers for financial resources.  Their number is [phone].  Please return to the Emergency Department if you experience any worsening of your condition.  We encourage you to follow up with a primary care provider.  Thank you for allowing [place] to be a part of your care.       Maudie Flakes, MD 08/01/19 [phone]

## 2019-08-01 NOTE — ED Triage Notes (Signed)
Pt was found on the side of the road, oriented to self only. Unknown LSW. Pt ambulatory, peg tube in place. Pt reports he has speech impediment, hx of stroke, afib.  EMS reported pt said he was going to his friend's house on road he was found on. Fire looked up [employer], they moved away 4 years ago.

## 2019-08-01 NOTE — Discharge Instructions (Addendum)
You were evaluated in the Emergency Department and after careful evaluation, we did not find any emergent condition requiring admission or further testing in the hospital.  Your exam/testing today is overall reassuring.  You can call our social workers for financial resources.  Their number is [phone].  Please return to the Emergency Department if you experience any worsening of your condition.  We encourage you to follow up with a primary care provider.  Thank you for allowing [place] to be a part of your care.

## 2019-08-01 NOTE — Patient Outreach (Signed)
[name]) Care Management  08/01/2019  [name] 03/08/1950 [id number]   Call placed to [name] to follow up on management of [name]'s care.  He report he does not have much time to talk because he is busy.  Advised that new patient appointment has been scheduled with [facility] office @ [facility] for 8/19.  Informed that this care manager will have CSW set up transportation.  He report [name] is supposed to get him and [name] a vehicle within the next couple days.  This care manager will still follow up with him within the next month to inquire about need for transportation.  [name] report that he will be going in for his own procedure on 7/7, will not be able to be in he home with [name].  Call at that time was disconnected, unable to reach him again.  Will follow up within the next month.  Call placed to [name] to inform her of PCP appointment and of [name] planned procedure for 7/7.  She will make plans to follow up with [name] directly while [name] is not available.    [facility] CM Care Plan Problem One     Most Recent Value  Care Plan Problem One Risk for readmission related to improper level of care  Role Documenting the Problem One Care Management [place] for Problem One Active  [facility]N Long Term Goal  [name] will be placed in ALF/increased level of care within the next 45 days  [facility]N Long Term Goal Start Date 07/05/19  [facility] Long Term Goal Met Date --  [Not met]  [facility] CM Short Term Goal #1  [name] will have appointment scheuled with PCP within the next 4 weeks  [facility] CM Short Term Goal #1 Start Date 07/05/19  Interventions for Short Term Goal #1 Call placed to schedule visit with new PCP, will arrange transportation  [facility] CM Short Term Goal #2  [name] and caregiver will report continued involvement and home visits with [facility] over the next 4 weeks  [facility] CM Short Term Goal #2 Start Date 07/05/19  [name] CM Short Term Goal #2 Met  Date 08/01/19     [name], RN, MSN [place] [phone]

## 2019-08-02 ENCOUNTER — Ambulatory Visit: Payer: Self-pay | Admitting: *Deleted

## 2019-08-03 ENCOUNTER — Encounter: Payer: Self-pay | Admitting: Surgery

## 2019-08-20 DIAGNOSIS — Z9181 History of falling: Secondary | ICD-10-CM | POA: Diagnosis not present

## 2019-08-20 DIAGNOSIS — I69320 Aphasia following cerebral infarction: Secondary | ICD-10-CM | POA: Diagnosis not present

## 2019-08-20 DIAGNOSIS — Z7901 Long term (current) use of anticoagulants: Secondary | ICD-10-CM | POA: Diagnosis not present

## 2019-08-20 DIAGNOSIS — I739 Peripheral vascular disease, unspecified: Secondary | ICD-10-CM | POA: Diagnosis not present

## 2019-08-20 DIAGNOSIS — E7800 Pure hypercholesterolemia, unspecified: Secondary | ICD-10-CM | POA: Diagnosis not present

## 2019-08-20 DIAGNOSIS — I69391 Dysphagia following cerebral infarction: Secondary | ICD-10-CM | POA: Diagnosis not present

## 2019-08-20 DIAGNOSIS — E538 Deficiency of other specified B group vitamins: Secondary | ICD-10-CM | POA: Diagnosis not present

## 2019-08-20 DIAGNOSIS — F332 Major depressive disorder, recurrent severe without psychotic features: Secondary | ICD-10-CM | POA: Diagnosis not present

## 2019-08-20 DIAGNOSIS — D649 Anemia, unspecified: Secondary | ICD-10-CM | POA: Diagnosis not present

## 2019-08-20 DIAGNOSIS — R1310 Dysphagia, unspecified: Secondary | ICD-10-CM | POA: Diagnosis not present

## 2019-08-20 DIAGNOSIS — I4891 Unspecified atrial fibrillation: Secondary | ICD-10-CM | POA: Diagnosis not present

## 2019-08-20 DIAGNOSIS — F17200 Nicotine dependence, unspecified, uncomplicated: Secondary | ICD-10-CM | POA: Diagnosis not present

## 2019-08-20 DIAGNOSIS — J449 Chronic obstructive pulmonary disease, unspecified: Secondary | ICD-10-CM | POA: Diagnosis not present

## 2019-08-20 DIAGNOSIS — Z931 Gastrostomy status: Secondary | ICD-10-CM | POA: Diagnosis not present

## 2019-08-20 DIAGNOSIS — I10 Essential (primary) hypertension: Secondary | ICD-10-CM | POA: Diagnosis not present

## 2019-08-21 ENCOUNTER — Other Ambulatory Visit: Payer: Self-pay | Admitting: *Deleted

## 2019-08-21 NOTE — Patient Outreach (Signed)
[facility]) Care Management  08/21/2019  [name] Sep 18, 1950 [id number]   Call placed to [name] caregiver to follow up on member's current condition.  He report member is still about the same.  He has been complaining again about back pain and about having the feeding tube remain in place.  Informed [name] again that the decision was made to leave the tube in place just in case he need it in the future given his risk of recurrent stroke.  He verbalizes understanding.  Reminded of appointment with new PCP scheduled for 8/19, contact information and address for office provided.  He report he now have a vehicle and will be able to take member. He still express interest in having member placed, state member is also still wanting this.  Advised again that CSW wasn't able to proceed with placement due to [name] not providing needed information.  He will try again to have [name] call CSW with information.  Denies any urgent concerns at this time, will follow up within the next month.  Goals Addressed              This Visit's Progress   .  Per caregiver, patient still want to be more independent or be placed in facility (pt-stated)        CARE PLAN ENTRY (see longitudinal plan of care for additional care plan information)  Current Barriers:  . Cognitive Deficits  Nurse Case Manager Clinical Goal(s):  Marland Kitchen Over the next 31 days, patient will verbalize understanding of plan for placement versus living independently . Over the next 28 days, patient will demonstrate improved health management independence as evidenced byperforming more activities independently . Over the next 28 days, patient will work with CM clinical social worker to follow up on placement  Interventions:  . Inter-disciplinary care team collaboration (see longitudinal plan of care) . Collaborated with CSW regarding continued interest in placement . Discussed plans with patient for  ongoing care management follow up and provided patient with direct contact information for care management team . Provided patient and/or caregiver with progression information about [facility] (community resource).  Patient Self Care Activities:  . Attends all scheduled provider appointments . Performs ADL's independently . Calls provider office for new concerns or questions . Unable to perform IADLs independently  Initial goal documentation       [name], RN, MSN [place] [phone]

## 2019-08-22 ENCOUNTER — Other Ambulatory Visit: Payer: Self-pay | Admitting: *Deleted

## 2019-08-22 DIAGNOSIS — I10 Essential (primary) hypertension: Secondary | ICD-10-CM | POA: Diagnosis not present

## 2019-08-22 DIAGNOSIS — I69391 Dysphagia following cerebral infarction: Secondary | ICD-10-CM | POA: Diagnosis not present

## 2019-08-22 DIAGNOSIS — I739 Peripheral vascular disease, unspecified: Secondary | ICD-10-CM | POA: Diagnosis not present

## 2019-08-22 DIAGNOSIS — J449 Chronic obstructive pulmonary disease, unspecified: Secondary | ICD-10-CM | POA: Diagnosis not present

## 2019-08-22 DIAGNOSIS — R1310 Dysphagia, unspecified: Secondary | ICD-10-CM | POA: Diagnosis not present

## 2019-08-22 DIAGNOSIS — I4891 Unspecified atrial fibrillation: Secondary | ICD-10-CM | POA: Diagnosis not present

## 2019-08-22 NOTE — Patient Outreach (Signed)
[facility]) Care Management  08/22/2019  [name] [birth year] [id number]   Call received from L. Apple with [facility] to provide update on member's case.  State she was out to visit the member last week as well as today.  A speech therapy referral was placed due to worsening of member's aphasia.  ST made visit today and member reported that he is no longer safe in his home.  He is also contemplating suicide, no active thoughts nor does he have a plan but he has stopped taking his medications.  He is also going days without eating.  Referral has been placed to APS, this care manager will inform [facility] CSW Ms. Saporito and collaborate with [facility] to provide plan of care.  Will follow up with Ms. Apple within the next 2 business days.  [name], [place], MSN [place] [phone]

## 2019-08-23 ENCOUNTER — Emergency Department (HOSPITAL_COMMUNITY)
Admission: EM | Admit: 2019-08-23 | Discharge: 2019-08-23 | Disposition: A | Discharge status: Home / Self Care | Payer: Medicare Other | Attending: Emergency Medicine | Admitting: Emergency Medicine

## 2019-08-23 ENCOUNTER — Other Ambulatory Visit: Payer: Self-pay | Admitting: *Deleted

## 2019-08-23 ENCOUNTER — Other Ambulatory Visit: Payer: Self-pay

## 2019-08-23 DIAGNOSIS — R001 Bradycardia, unspecified: Secondary | ICD-10-CM | POA: Diagnosis not present

## 2019-08-23 DIAGNOSIS — I739 Peripheral vascular disease, unspecified: Secondary | ICD-10-CM | POA: Diagnosis not present

## 2019-08-23 DIAGNOSIS — I4891 Unspecified atrial fibrillation: Secondary | ICD-10-CM | POA: Diagnosis not present

## 2019-08-23 DIAGNOSIS — I69391 Dysphagia following cerebral infarction: Secondary | ICD-10-CM | POA: Diagnosis not present

## 2019-08-23 DIAGNOSIS — R079 Chest pain, unspecified: Secondary | ICD-10-CM | POA: Diagnosis not present

## 2019-08-23 DIAGNOSIS — J449 Chronic obstructive pulmonary disease, unspecified: Secondary | ICD-10-CM | POA: Insufficient documentation

## 2019-08-23 DIAGNOSIS — I10 Essential (primary) hypertension: Secondary | ICD-10-CM | POA: Insufficient documentation

## 2019-08-23 DIAGNOSIS — N390 Urinary tract infection, site not specified: Secondary | ICD-10-CM | POA: Diagnosis not present

## 2019-08-23 DIAGNOSIS — F17210 Nicotine dependence, cigarettes, uncomplicated: Secondary | ICD-10-CM | POA: Diagnosis not present

## 2019-08-23 DIAGNOSIS — Z801 Family history of malignant neoplasm of trachea, bronchus and lung: Secondary | ICD-10-CM | POA: Insufficient documentation

## 2019-08-23 DIAGNOSIS — K9423 Gastrostomy malfunction: Secondary | ICD-10-CM | POA: Insufficient documentation

## 2019-08-23 DIAGNOSIS — R1310 Dysphagia, unspecified: Secondary | ICD-10-CM | POA: Diagnosis not present

## 2019-08-23 DIAGNOSIS — K9429 Other complications of gastrostomy: Secondary | ICD-10-CM

## 2019-08-23 DIAGNOSIS — Z79899 Other long term (current) drug therapy: Secondary | ICD-10-CM | POA: Insufficient documentation

## 2019-08-23 DIAGNOSIS — R0789 Other chest pain: Secondary | ICD-10-CM | POA: Diagnosis not present

## 2019-08-23 DIAGNOSIS — R1084 Generalized abdominal pain: Secondary | ICD-10-CM | POA: Diagnosis not present

## 2019-08-23 LAB — COMPREHENSIVE METABOLIC PANEL
ALT: 11 U/L (ref 0–44)
AST: 14 U/L — ABNORMAL LOW (ref 15–41)
Albumin: 3.5 g/dL (ref 3.5–5.0)
Alkaline Phosphatase: 50 U/L (ref 38–126)
Anion gap: 9 (ref 5–15)
BUN: 16 mg/dL (ref 8–23)
CO2: 29 mmol/L (ref 22–32)
Calcium: 9.2 mg/dL (ref 8.9–10.3)
Chloride: 105 mmol/L (ref 98–111)
Creatinine, Ser: 1.22 mg/dL (ref 0.61–1.24)
GFR calc Af Amer: >60 mL/min (ref >60)
GFR calc non Af Amer: >60 mL/min (ref >60)
Glucose, Bld: 86 mg/dL (ref 70–99)
Potassium: 3.9 mmol/L (ref 3.5–5.1)
Sodium: 143 mmol/L (ref 135–145)
Total Bilirubin: 0.9 mg/dL (ref 0.3–1.2)
Total Protein: 6.7 g/dL (ref 6.5–8.1)

## 2019-08-23 LAB — CBC
HCT: 43.1 % (ref 39.0–52.0)
Hemoglobin: 14.2 g/dL (ref 13.0–17.0)
MCH: 31.8 pg (ref 26.0–34.0)
MCHC: 32.9 g/dL (ref 30.0–36.0)
MCV: 96.4 fL (ref 80.0–100.0)
Platelets: 183 10*3/uL (ref 150–400)
RBC: 4.47 MIL/uL (ref 4.22–5.81)
RDW: 14 % (ref 11.5–15.5)
WBC: 6.9 10*3/uL (ref 4.0–10.5)
nRBC: 0 % (ref 0.0–0.2)

## 2019-08-23 LAB — URINALYSIS, ROUTINE W REFLEX MICROSCOPIC
Bilirubin Urine: NEGATIVE
Glucose, UA: NEGATIVE mg/dL
Ketones, ur: NEGATIVE mg/dL
Nitrite: NEGATIVE
Protein, ur: NEGATIVE mg/dL
Specific Gravity, Urine: 1.006 (ref 1.005–1.030)
WBC, UA: >50 WBC/hpf — ABNORMAL HIGH (ref 0–5)
pH: 8 (ref 5.0–8.0)

## 2019-08-23 LAB — LIPASE, BLOOD: Lipase: 20 U/L (ref 11–51)

## 2019-08-23 MED ORDER — CEPHALEXIN 250 MG PO CAPS
500.0000 mg | ORAL_CAPSULE | Freq: Once | ORAL | Status: AC
  Administered 2019-08-23: 500 mg via ORAL
  Filled 2019-08-23: qty 2

## 2019-08-23 MED ORDER — CEPHALEXIN 500 MG PO CAPS
500.0000 mg | ORAL_CAPSULE | Freq: Two times a day (BID) | ORAL | 0 refills | Status: AC
Start: 2019-08-23 — End: 2019-08-28

## 2019-08-23 MED ORDER — SODIUM CHLORIDE 0.9% FLUSH: 3.0000 mL | Freq: Once | INTRAVENOUS | Status: DC

## 2019-08-23 MED ORDER — CEPHALEXIN 500 MG PO CAPS
500.0000 mg | ORAL_CAPSULE | Freq: Two times a day (BID) | ORAL | 0 refills | Status: DC
Start: 2019-08-23 — End: 2019-08-23

## 2019-08-23 NOTE — ED Triage Notes (Signed)
Patient brought in by EMS from home. C/o abdominal tenderness and problem with peg tube site. Reported patient was picked up from a house and living situation is not ideal; There is some abnormality from peg tube site as well and reported it hasn't been used.

## 2019-08-23 NOTE — Discharge Instructions (Addendum)
As discussed, today's evaluation has been generally reassuring.  Your physical exam, labs, vital signs are all reassuring.  There is some evidence for urinary tract infection, but otherwise with your reassuring studies, is appropriate for you to return home. At home, please expect ongoing management from your social worker and case Nurse, children's.  They will likely contact you in the coming days for appropriate ongoing efforts for safe living arrangements.  Your prescription has been sent to your pharmacy, and a paper copy has been provided as well.  Return here for concerning changes in your condition.

## 2019-08-23 NOTE — Social Work (Signed)
TOC CSW received a call from DSS APS, Shara Blazing [phone].  [name] will call Madilyn Fireman, LCSW [facility] in the morning.  Duvan Mousel Tarpley-Carter, MSW, [place] ED Transitions of Engineer, building services Health [phone]

## 2019-08-23 NOTE — Care Management (Signed)
ED CM received call from Margarita Grizzle from [place] concerning patient an APS has been filed patient is not able to care for self and does not have anyone to safely care for patient. ED CSW consulted.

## 2019-08-23 NOTE — Progress Notes (Addendum)
Consult request has been received. CSW attempting to follow up at present time.  CSW spoke to EDP who feels pt may be appropriate for a return home and that EDP spoke to the [place] staff who is already working to place the from home.  5:55 PM CSW spoke to RN CM who will update pt's EDP about collateral info.  CSW will continue to follow for D/C needs.  Alphonse Guild. Nolie Bignell  MSW, LCSW, LCAS, [facility] Transitions of Care Clinical Social Worker Care Coordination Department Ph: [phone]
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
## 2019-08-24 DIAGNOSIS — R1310 Dysphagia, unspecified: Secondary | ICD-10-CM | POA: Diagnosis not present

## 2019-08-24 DIAGNOSIS — J449 Chronic obstructive pulmonary disease, unspecified: Secondary | ICD-10-CM | POA: Diagnosis not present

## 2019-08-24 DIAGNOSIS — I10 Essential (primary) hypertension: Secondary | ICD-10-CM | POA: Diagnosis not present

## 2019-08-24 DIAGNOSIS — I69391 Dysphagia following cerebral infarction: Secondary | ICD-10-CM | POA: Diagnosis not present

## 2019-08-24 DIAGNOSIS — I739 Peripheral vascular disease, unspecified: Secondary | ICD-10-CM | POA: Diagnosis not present

## 2019-08-24 DIAGNOSIS — I4891 Unspecified atrial fibrillation: Secondary | ICD-10-CM | POA: Diagnosis not present

## 2019-08-29 DIAGNOSIS — I69391 Dysphagia following cerebral infarction: Secondary | ICD-10-CM | POA: Diagnosis not present

## 2019-08-29 DIAGNOSIS — R1310 Dysphagia, unspecified: Secondary | ICD-10-CM | POA: Diagnosis not present

## 2019-08-29 DIAGNOSIS — I4891 Unspecified atrial fibrillation: Secondary | ICD-10-CM | POA: Diagnosis not present

## 2019-08-29 DIAGNOSIS — I739 Peripheral vascular disease, unspecified: Secondary | ICD-10-CM | POA: Diagnosis not present

## 2019-08-29 DIAGNOSIS — I10 Essential (primary) hypertension: Secondary | ICD-10-CM | POA: Diagnosis not present

## 2019-08-29 DIAGNOSIS — J449 Chronic obstructive pulmonary disease, unspecified: Secondary | ICD-10-CM | POA: Diagnosis not present

## 2019-08-30 DIAGNOSIS — I10 Essential (primary) hypertension: Secondary | ICD-10-CM | POA: Diagnosis not present

## 2019-08-30 DIAGNOSIS — I69391 Dysphagia following cerebral infarction: Secondary | ICD-10-CM | POA: Diagnosis not present

## 2019-08-30 DIAGNOSIS — I739 Peripheral vascular disease, unspecified: Secondary | ICD-10-CM | POA: Diagnosis not present

## 2019-08-30 DIAGNOSIS — R1310 Dysphagia, unspecified: Secondary | ICD-10-CM | POA: Diagnosis not present

## 2019-08-30 DIAGNOSIS — I4891 Unspecified atrial fibrillation: Secondary | ICD-10-CM | POA: Diagnosis not present

## 2019-08-30 DIAGNOSIS — J449 Chronic obstructive pulmonary disease, unspecified: Secondary | ICD-10-CM | POA: Diagnosis not present

## 2019-08-31 DIAGNOSIS — I739 Peripheral vascular disease, unspecified: Secondary | ICD-10-CM | POA: Diagnosis not present

## 2019-08-31 DIAGNOSIS — I4891 Unspecified atrial fibrillation: Secondary | ICD-10-CM | POA: Diagnosis not present

## 2019-08-31 DIAGNOSIS — R1310 Dysphagia, unspecified: Secondary | ICD-10-CM | POA: Diagnosis not present

## 2019-08-31 DIAGNOSIS — I69391 Dysphagia following cerebral infarction: Secondary | ICD-10-CM | POA: Diagnosis not present

## 2019-08-31 DIAGNOSIS — J449 Chronic obstructive pulmonary disease, unspecified: Secondary | ICD-10-CM | POA: Diagnosis not present

## 2019-08-31 DIAGNOSIS — I10 Essential (primary) hypertension: Secondary | ICD-10-CM | POA: Diagnosis not present

## 2019-09-04 ENCOUNTER — Encounter: Payer: Self-pay | Admitting: *Deleted

## 2019-09-04 ENCOUNTER — Other Ambulatory Visit: Payer: Self-pay | Admitting: *Deleted

## 2019-09-04 DIAGNOSIS — I10 Essential (primary) hypertension: Secondary | ICD-10-CM | POA: Diagnosis not present

## 2019-09-04 DIAGNOSIS — J449 Chronic obstructive pulmonary disease, unspecified: Secondary | ICD-10-CM | POA: Diagnosis not present

## 2019-09-04 DIAGNOSIS — R1310 Dysphagia, unspecified: Secondary | ICD-10-CM | POA: Diagnosis not present

## 2019-09-04 DIAGNOSIS — I739 Peripheral vascular disease, unspecified: Secondary | ICD-10-CM | POA: Diagnosis not present

## 2019-09-04 DIAGNOSIS — I69391 Dysphagia following cerebral infarction: Secondary | ICD-10-CM | POA: Diagnosis not present

## 2019-09-04 DIAGNOSIS — I4891 Unspecified atrial fibrillation: Secondary | ICD-10-CM | POA: Diagnosis not present
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
## 2019-09-06 DIAGNOSIS — I69391 Dysphagia following cerebral infarction: Secondary | ICD-10-CM | POA: Diagnosis not present

## 2019-09-06 DIAGNOSIS — R1310 Dysphagia, unspecified: Secondary | ICD-10-CM | POA: Diagnosis not present

## 2019-09-06 DIAGNOSIS — J449 Chronic obstructive pulmonary disease, unspecified: Secondary | ICD-10-CM | POA: Diagnosis not present

## 2019-09-06 DIAGNOSIS — I4891 Unspecified atrial fibrillation: Secondary | ICD-10-CM | POA: Diagnosis not present

## 2019-09-06 DIAGNOSIS — I739 Peripheral vascular disease, unspecified: Secondary | ICD-10-CM | POA: Diagnosis not present

## 2019-09-06 DIAGNOSIS — I10 Essential (primary) hypertension: Secondary | ICD-10-CM | POA: Diagnosis not present

## 2019-09-10 DIAGNOSIS — I4891 Unspecified atrial fibrillation: Secondary | ICD-10-CM | POA: Diagnosis not present

## 2019-09-10 DIAGNOSIS — J449 Chronic obstructive pulmonary disease, unspecified: Secondary | ICD-10-CM | POA: Diagnosis not present

## 2019-09-10 DIAGNOSIS — I10 Essential (primary) hypertension: Secondary | ICD-10-CM | POA: Diagnosis not present

## 2019-09-10 DIAGNOSIS — I739 Peripheral vascular disease, unspecified: Secondary | ICD-10-CM | POA: Diagnosis not present

## 2019-09-10 DIAGNOSIS — I69391 Dysphagia following cerebral infarction: Secondary | ICD-10-CM | POA: Diagnosis not present

## 2019-09-10 DIAGNOSIS — R1310 Dysphagia, unspecified: Secondary | ICD-10-CM | POA: Diagnosis not present

## 2019-09-12 DIAGNOSIS — I4891 Unspecified atrial fibrillation: Secondary | ICD-10-CM | POA: Diagnosis not present

## 2019-09-12 DIAGNOSIS — I10 Essential (primary) hypertension: Secondary | ICD-10-CM | POA: Diagnosis not present

## 2019-09-12 DIAGNOSIS — I739 Peripheral vascular disease, unspecified: Secondary | ICD-10-CM | POA: Diagnosis not present

## 2019-09-12 DIAGNOSIS — J449 Chronic obstructive pulmonary disease, unspecified: Secondary | ICD-10-CM | POA: Diagnosis not present

## 2019-09-12 DIAGNOSIS — R1310 Dysphagia, unspecified: Secondary | ICD-10-CM | POA: Diagnosis not present

## 2019-09-12 DIAGNOSIS — I69391 Dysphagia following cerebral infarction: Secondary | ICD-10-CM | POA: Diagnosis not present

## 2019-09-18 DIAGNOSIS — R1310 Dysphagia, unspecified: Secondary | ICD-10-CM | POA: Diagnosis not present

## 2019-09-18 DIAGNOSIS — I4891 Unspecified atrial fibrillation: Secondary | ICD-10-CM | POA: Diagnosis not present

## 2019-09-18 DIAGNOSIS — J449 Chronic obstructive pulmonary disease, unspecified: Secondary | ICD-10-CM | POA: Diagnosis not present

## 2019-09-18 DIAGNOSIS — I69391 Dysphagia following cerebral infarction: Secondary | ICD-10-CM | POA: Diagnosis not present

## 2019-09-18 DIAGNOSIS — I739 Peripheral vascular disease, unspecified: Secondary | ICD-10-CM | POA: Diagnosis not present

## 2019-09-18 DIAGNOSIS — I10 Essential (primary) hypertension: Secondary | ICD-10-CM | POA: Diagnosis not present

## 2019-09-19 DIAGNOSIS — I69320 Aphasia following cerebral infarction: Secondary | ICD-10-CM | POA: Diagnosis not present

## 2019-09-19 DIAGNOSIS — Z7901 Long term (current) use of anticoagulants: Secondary | ICD-10-CM | POA: Diagnosis not present

## 2019-09-19 DIAGNOSIS — I10 Essential (primary) hypertension: Secondary | ICD-10-CM | POA: Diagnosis not present

## 2019-09-19 DIAGNOSIS — F17200 Nicotine dependence, unspecified, uncomplicated: Secondary | ICD-10-CM | POA: Diagnosis not present

## 2019-09-19 DIAGNOSIS — D649 Anemia, unspecified: Secondary | ICD-10-CM | POA: Diagnosis not present

## 2019-09-19 DIAGNOSIS — Z9181 History of falling: Secondary | ICD-10-CM | POA: Diagnosis not present

## 2019-09-19 DIAGNOSIS — R1310 Dysphagia, unspecified: Secondary | ICD-10-CM | POA: Diagnosis not present

## 2019-09-19 DIAGNOSIS — E7800 Pure hypercholesterolemia, unspecified: Secondary | ICD-10-CM | POA: Diagnosis not present

## 2019-09-19 DIAGNOSIS — E538 Deficiency of other specified B group vitamins: Secondary | ICD-10-CM | POA: Diagnosis not present

## 2019-09-19 DIAGNOSIS — J449 Chronic obstructive pulmonary disease, unspecified: Secondary | ICD-10-CM | POA: Diagnosis not present

## 2019-09-19 DIAGNOSIS — I4891 Unspecified atrial fibrillation: Secondary | ICD-10-CM | POA: Diagnosis not present

## 2019-09-19 DIAGNOSIS — Z931 Gastrostomy status: Secondary | ICD-10-CM | POA: Diagnosis not present

## 2019-09-19 DIAGNOSIS — F332 Major depressive disorder, recurrent severe without psychotic features: Secondary | ICD-10-CM | POA: Diagnosis not present

## 2019-09-19 DIAGNOSIS — I739 Peripheral vascular disease, unspecified: Secondary | ICD-10-CM | POA: Diagnosis not present

## 2019-09-19 DIAGNOSIS — I69391 Dysphagia following cerebral infarction: Secondary | ICD-10-CM | POA: Diagnosis not present

## 2019-09-26 ENCOUNTER — Other Ambulatory Visit: Payer: Self-pay | Admitting: *Deleted

## 2019-09-26 NOTE — Patient Outreach (Signed)
[facility]) Care Management  09/26/2019  REVERE MAAHS 1950-05-20 [id number]   Call placed to [facility] caregiver, Shanon Brow, to remind him of appointment for tomorrow.  Member has new patient appointment at Primary [facility] at [facility], Mr. Shanon Brow reported he did remember the appointment, but is hoping member will agree to go.  He denies any urgent concerns, reminded of appointment time.  Call also placed to L. Apple with [facility], advised her of appointment.  She report member has been better the past few weeks when she has visited, but still working with CSW for potential safer residential placement.  Will follow up with member/caregiver within the next month.  Will continue to collaborate with [facility].  Goals Addressed              This Visit's Progress   .  Per caregiver, patient still want to be more independent or be placed in facility (pt-stated)   On track     [place] (see longitudinal plan of care for additional care plan information)  Current Barriers:  . Cognitive Deficits  Nurse Case Manager Clinical Goal(s):  Marland Kitchen Over the next 31 days, patient will verbalize understanding of plan for placement versus living independently . Over the next 28 days, patient will demonstrate improved health management independence as evidenced byperforming more activities independently . Over the next 28 days, patient will work with CM clinical social worker to follow up on placement  Interventions:  . Inter-disciplinary care team collaboration (see longitudinal plan of care) . Collaborated with CSW regarding continued interest in placement . Discussed plans with patient for ongoing care management follow up and provided patient with direct contact information for care management team . Provided patient and/or caregiver with progression information about [facility] (community resource).  Patient Self Care Activities:  . Attends all scheduled provider  appointments . Performs ADL's independently . Calls provider office for new concerns or questions . Unable to perform IADLs independently  Initial goal documentation       [name], RN, MSN [place] [phone]

## 2019-09-27 ENCOUNTER — Other Ambulatory Visit: Payer: Self-pay

## 2019-09-27 ENCOUNTER — Encounter: Payer: Self-pay | Admitting: Physician Assistant

## 2019-09-27 ENCOUNTER — Ambulatory Visit (INDEPENDENT_AMBULATORY_CARE_PROVIDER_SITE_OTHER): Payer: Medicare Other | Admitting: Physician Assistant

## 2019-09-27 VITALS — BP 106/69 | HR 56 | Temp 97.8°F | Ht 67.72 in | Wt 161.7 lb

## 2019-09-27 DIAGNOSIS — Z598 Other problems related to housing and economic circumstances: Secondary | ICD-10-CM

## 2019-09-27 DIAGNOSIS — I63119 Cerebral infarction due to embolism of unspecified vertebral artery: Secondary | ICD-10-CM

## 2019-09-27 DIAGNOSIS — I10 Essential (primary) hypertension: Secondary | ICD-10-CM | POA: Diagnosis not present

## 2019-09-27 DIAGNOSIS — Z599 Problem related to housing and economic circumstances, unspecified: Secondary | ICD-10-CM

## 2019-09-27 DIAGNOSIS — I63412 Cerebral infarction due to embolism of left middle cerebral artery: Secondary | ICD-10-CM

## 2019-09-27 DIAGNOSIS — F79 Unspecified intellectual disabilities: Secondary | ICD-10-CM

## 2019-09-27 DIAGNOSIS — I4891 Unspecified atrial fibrillation: Secondary | ICD-10-CM | POA: Diagnosis not present

## 2019-09-27 DIAGNOSIS — I739 Peripheral vascular disease, unspecified: Secondary | ICD-10-CM | POA: Diagnosis not present

## 2019-09-27 DIAGNOSIS — R1310 Dysphagia, unspecified: Secondary | ICD-10-CM | POA: Diagnosis not present

## 2019-09-27 DIAGNOSIS — I69320 Aphasia following cerebral infarction: Secondary | ICD-10-CM

## 2019-09-27 DIAGNOSIS — J449 Chronic obstructive pulmonary disease, unspecified: Secondary | ICD-10-CM | POA: Diagnosis not present

## 2019-09-27 DIAGNOSIS — Z9189 Other specified personal risk factors, not elsewhere classified: Secondary | ICD-10-CM | POA: Diagnosis not present

## 2019-09-27 DIAGNOSIS — I69391 Dysphagia following cerebral infarction: Secondary | ICD-10-CM | POA: Diagnosis not present

## 2019-09-27 DIAGNOSIS — T7401XA Adult neglect or abandonment, confirmed, initial encounter: Secondary | ICD-10-CM

## 2019-09-27 DIAGNOSIS — Z7689 Persons encountering health services in other specified circumstances: Secondary | ICD-10-CM
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
## 2019-10-04 DIAGNOSIS — H348312 Tributary (branch) retinal vein occlusion, right eye, stable: Secondary | ICD-10-CM | POA: Diagnosis not present

## 2019-10-04 DIAGNOSIS — H2513 Age-related nuclear cataract, bilateral: Secondary | ICD-10-CM | POA: Diagnosis not present

## 2019-10-04 DIAGNOSIS — H10413 Chronic giant papillary conjunctivitis, bilateral: Secondary | ICD-10-CM | POA: Diagnosis not present

## 2019-10-19 ENCOUNTER — Encounter: Payer: Self-pay | Admitting: Physician Assistant

## 2019-10-19 ENCOUNTER — Ambulatory Visit (INDEPENDENT_AMBULATORY_CARE_PROVIDER_SITE_OTHER): Payer: Medicare Other | Admitting: Physician Assistant

## 2019-10-19 ENCOUNTER — Other Ambulatory Visit: Payer: Self-pay

## 2019-10-19 VITALS — BP 130/81 | HR 52 | Ht 67.72 in | Wt 166.7 lb

## 2019-10-19 DIAGNOSIS — R000 Tachycardia, unspecified: Secondary | ICD-10-CM | POA: Diagnosis not present

## 2019-10-19 DIAGNOSIS — F322 Major depressive disorder, single episode, severe without psychotic features: Secondary | ICD-10-CM

## 2019-10-19 DIAGNOSIS — E43 Unspecified severe protein-calorie malnutrition: Secondary | ICD-10-CM

## 2019-10-19 DIAGNOSIS — T7601XA Adult neglect or abandonment, suspected, initial encounter: Secondary | ICD-10-CM

## 2019-10-19 DIAGNOSIS — I69320 Aphasia following cerebral infarction: Secondary | ICD-10-CM | POA: Diagnosis not present

## 2019-10-19 DIAGNOSIS — I4891 Unspecified atrial fibrillation: Secondary | ICD-10-CM | POA: Diagnosis not present

## 2019-10-19 DIAGNOSIS — Z8673 Personal history of transient ischemic attack (TIA), and cerebral infarction without residual deficits: Secondary | ICD-10-CM

## 2019-10-19 DIAGNOSIS — Z9189 Other specified personal risk factors, not elsewhere classified: Secondary | ICD-10-CM
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
## 2019-10-24 ENCOUNTER — Other Ambulatory Visit: Payer: Self-pay | Admitting: *Deleted
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
## 2019-10-25 DIAGNOSIS — D509 Iron deficiency anemia, unspecified: Secondary | ICD-10-CM | POA: Diagnosis not present

## 2019-10-25 DIAGNOSIS — D473 Essential (hemorrhagic) thrombocythemia: Secondary | ICD-10-CM | POA: Diagnosis not present

## 2019-10-25 DIAGNOSIS — R319 Hematuria, unspecified: Secondary | ICD-10-CM | POA: Diagnosis not present

## 2019-10-25 DIAGNOSIS — I10 Essential (primary) hypertension: Secondary | ICD-10-CM | POA: Diagnosis not present

## 2019-11-01 ENCOUNTER — Other Ambulatory Visit: Payer: Self-pay | Admitting: *Deleted

## 2019-11-01 NOTE — Patient Outreach (Signed)
[facility]) Care Management  11/01/2019  [name] Aug 17, 1950 [id number]   Incoming call received from L. Apple, RN with [facility] to discuss update on member's care.  She report member has not showed significant improvement.  He had his feeding tube removed, wound hasn't healed.  Ms. [name] has been visiting twice a week for wound care, member has also been receiving visits from OT.  She is concerned because member's roommate will soon be having surgery and member will not be able to care for himself.  The ideal outcome of member's status remains to have him placed however no one on care team has been able to receive financial information from [facility] POA [name]. [name].  Ms. [name] report referral has been placed to [facility].  She will follow up with them regarding referral status.  This care manager will follow up with member/roommate as planned within the next 3 weeks, will follow up with Ms. [name] as needed.  Will close case if member is active with hospice.  Goals Addressed              This Visit's Progress   .  Per caregiver, patient still want to be more independent or be placed in facility (pt-stated)   Not on track     [place] (see longitudinal plan of care for additional care plan information)  Current Barriers:  . Cognitive Deficits  Nurse Case Manager Clinical Goal(s):  Marland Kitchen Over the next 31 days, patient will verbalize understanding of plan for placement versus living independently . Over the next 28 days, patient will demonstrate improved health management independence as evidenced byperforming more activities independently . Over the next 28 days, patient will work with CM clinical social worker to follow up on placement  Interventions:  . Inter-disciplinary care team collaboration (see longitudinal plan of care) . Collaborated with CSW regarding continued interest in placement . Discussed plans with patient for ongoing  care management follow up and provided patient with direct contact information for care management team . Provided patient and/or caregiver with progression information about [facility] (community resource).  Patient Self Care Activities:  . Attends all scheduled provider appointments . Performs ADL's independently . Calls provider office for new concerns or questions . Unable to perform IADLs independently  UPDATE: Patient not progressing, POA not willing to provide information        [name], RN, MSN [place] [phone]

## 2019-11-16 ENCOUNTER — Encounter: Payer: Self-pay | Admitting: Physician Assistant

## 2019-11-16 ENCOUNTER — Ambulatory Visit (INDEPENDENT_AMBULATORY_CARE_PROVIDER_SITE_OTHER): Payer: Medicare Other | Admitting: Physician Assistant

## 2019-11-16 ENCOUNTER — Other Ambulatory Visit: Payer: Self-pay

## 2019-11-16 VITALS — BP 142/92 | HR 98 | Ht 67.75 in | Wt 169.8 lb

## 2019-11-16 DIAGNOSIS — E785 Hyperlipidemia, unspecified: Secondary | ICD-10-CM

## 2019-11-16 DIAGNOSIS — Z0000 Encounter for general adult medical examination without abnormal findings: Secondary | ICD-10-CM

## 2019-11-16 DIAGNOSIS — Z8673 Personal history of transient ischemic attack (TIA), and cerebral infarction without residual deficits: Secondary | ICD-10-CM | POA: Diagnosis not present

## 2019-11-16 DIAGNOSIS — Z131 Encounter for screening for diabetes mellitus: Secondary | ICD-10-CM

## 2019-11-16 DIAGNOSIS — I10 Essential (primary) hypertension: Secondary | ICD-10-CM

## 2019-11-16 DIAGNOSIS — F332 Major depressive disorder, recurrent severe without psychotic features: Secondary | ICD-10-CM

## 2019-11-16 DIAGNOSIS — I4891 Unspecified atrial fibrillation: Secondary | ICD-10-CM

## 2019-11-16 DIAGNOSIS — I160 Hypertensive urgency: Secondary | ICD-10-CM

## 2019-11-16 DIAGNOSIS — I69320 Aphasia following cerebral infarction: Secondary | ICD-10-CM

## 2019-11-16 DIAGNOSIS — T7601XA Adult neglect or abandonment, suspected, initial encounter: Secondary | ICD-10-CM

## 2019-11-16 NOTE — Patient Instructions (Addendum)
Call the office Monday if blood pressure continues to be elevated >140/90.  Stroke Prevention Some medical conditions and lifestyle choices can lead to a higher risk for a stroke. You can help to prevent a stroke by making nutrition, lifestyle, and other changes. What nutrition changes can be made?   Eat healthy foods. ? Choose foods that are high in fiber. These include:  Fresh fruits.  Fresh vegetables.  Whole grains. ? Eat at least 5 or more servings of fruits and vegetables each day. Try to fill half of your plate at each meal with fruits and vegetables. ? Choose lean protein foods. These include:  Lowfat (lean) cuts of meat.  Chicken without skin.  Fish.  Tofu.  Beans.  Nuts. ? Eat low-fat dairy products. ? Avoid foods that:  Are high in salt (sodium).  Have saturated fat.  Have trans fat.  Have cholesterol.  Are processed.  Are premade.  Follow eating guidelines as told by your doctor. These may include: ? Reducing how many calories you eat and drink each day. ? Limiting how much salt you eat or drink each day to 1,500 milligrams (mg). ? Using only healthy fats for cooking. These include:  Olive oil.  Canola oil.  Sunflower oil. ? Counting how many carbohydrates you eat and drink each day. What lifestyle changes can be made?  Try to stay at a healthy weight. Talk to your doctor about what a good weight is for you.  Get at least 30 minutes of moderate physical activity at least 5 days a week. This can include: ? Fast walking. ? Biking. ? Swimming.  Do not use any products that have nicotine or tobacco. This includes cigarettes and e-cigarettes. If you need help quitting, ask your doctor. Avoid being around tobacco smoke in general.  Limit how much alcohol you drink to no more than 1 drink a day for nonpregnant women and 2 drinks a day for men. One drink equals 12 oz of beer, 5 oz of wine, or 1 oz of hard liquor.  Do not use drugs.  Avoid  taking birth control pills. Talk to your doctor about the risks of taking birth control pills if: ? You are over 11 years old. ? You smoke. ? You get migraines. ? You have had a blood clot. What other changes can be made?  Manage your cholesterol. ? It is important to eat a healthy diet. ? If your cholesterol cannot be managed through your diet, you may also need to take medicines. Take medicines as told by your doctor.  Manage your diabetes. ? It is important to eat a healthy diet and to exercise regularly. ? If your blood sugar cannot be managed through diet and exercise, you may need to take medicines. Take medicines as told by your doctor.  Control your high blood pressure (hypertension). ? Try to keep your blood pressure below 130/80. This can help lower your risk of stroke. ? It is important to eat a healthy diet and to exercise regularly. ? If your blood pressure cannot be managed through diet and exercise, you may need to take medicines. Take medicines as told by your doctor. ? Ask your doctor if you should check your blood pressure at home. ? Have your blood pressure checked every year. Do this even if your blood pressure is normal.  Talk to your doctor about getting checked for a sleep disorder. Signs of this can include: ? Snoring a lot. ? Feeling very tired.  Take over-the-counter and prescription medicines only as told by your doctor. These may include aspirin or blood thinners (antiplatelets or anticoagulants).  Make sure that any other medical conditions you have are managed. Where to find more information  American Stroke Association: www.strokeassociation.org  National Stroke Association: www.stroke.org Get help right away if:  You have any symptoms of stroke. "BE FAST" is an easy way to remember the main warning signs: ? B - Balance. Signs are dizziness, sudden trouble walking, or loss of balance. ? E - Eyes. Signs are trouble seeing or a sudden change in how  you see. ? F - Face. Signs are sudden weakness or loss of feeling of the face, or the face or eyelid drooping on one side. ? A - Arms. Signs are weakness or loss of feeling in an arm. This happens suddenly and usually on one side of the body. ? S - Speech. Signs are sudden trouble speaking, slurred speech, or trouble understanding what people say. ? T - Time. Time to call emergency services. Write down what time symptoms started.  You have other signs of stroke, such as: ? A sudden, very bad headache with no known cause. ? Feeling sick to your stomach (nausea). ? Throwing up (vomiting). ? Jerky movements you cannot control (seizure). These symptoms may represent a serious problem that is an emergency. Do not wait to see if the symptoms will go away. Get medical help right away. Call your local emergency services (911 in the [place] Do not drive yourself to the hospital. Summary  You can prevent a stroke by eating healthy, exercising, not smoking, drinking less alcohol, and treating other health problems, such as diabetes, high blood pressure, or high cholesterol.  Do not use any products that contain nicotine or tobacco, such as cigarettes and e-cigarettes.  Get help right away if you have any signs or symptoms of a stroke. This information is not intended to replace advice given to you by your health care provider. Make sure you discuss any questions you have with your health care provider. Document Revised: 03/23/2018 Document Reviewed: 04/28/2016 Elsevier Patient Education  [place].
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
## 2019-11-17 LAB — COMPREHENSIVE METABOLIC PANEL
ALT: 11 IU/L (ref 0–44)
AST: 17 IU/L (ref 0–40)
Albumin/Globulin Ratio: 1.9 (ref 1.2–2.2)
Albumin: 4.7 g/dL (ref 3.8–4.8)
Alkaline Phosphatase: 61 IU/L (ref 44–121)
BUN/Creatinine Ratio: 10 (ref 10–24)
BUN: 11 mg/dL (ref 8–27)
Bilirubin Total: 0.4 mg/dL (ref 0.0–1.2)
CO2: 27 mmol/L (ref 20–29)
Calcium: 10 mg/dL (ref 8.6–10.2)
Chloride: 98 mmol/L (ref 96–106)
Creatinine, Ser: 1.1 mg/dL (ref 0.76–1.27)
GFR calc Af Amer: 78 mL/min/{1.73_m2} (ref >59)
GFR calc non Af Amer: 68 mL/min/{1.73_m2} (ref >59)
Globulin, Total: 2.5 g/dL (ref 1.5–4.5)
Glucose: 51 mg/dL — ABNORMAL LOW (ref 65–99)
Potassium: 4 mmol/L (ref 3.5–5.2)
Sodium: 143 mmol/L (ref 134–144)
Total Protein: 7.2 g/dL (ref 6.0–8.5)

## 2019-11-17 LAB — LIPID PANEL
Chol/HDL Ratio: 3.8 ratio (ref 0.0–5.0)
Cholesterol, Total: 182 mg/dL (ref 100–199)
HDL: 48 mg/dL (ref >39)
LDL Chol Calc (NIH): 114 mg/dL — ABNORMAL HIGH (ref 0–99)
Triglycerides: 109 mg/dL (ref 0–149)
VLDL Cholesterol Cal: 20 mg/dL (ref 5–40)

## 2019-11-17 LAB — CBC
Hematocrit: 46.3 % (ref 37.5–51.0)
Hemoglobin: 16.3 g/dL (ref 13.0–17.7)
MCH: 34 pg — ABNORMAL HIGH (ref 26.6–33.0)
MCHC: 35.2 g/dL (ref 31.5–35.7)
MCV: 97 fL (ref 79–97)
Platelets: 200 10*3/uL (ref 150–450)
RBC: 4.79 x10E6/uL (ref 4.14–5.80)
RDW: 12.7 % (ref 11.6–15.4)
WBC: 6.4 10*3/uL (ref 3.4–10.8)

## 2019-11-17 LAB — TSH: TSH: 1.95 u[IU]/mL (ref 0.450–4.500)

## 2019-11-17 LAB — VALPROIC ACID LEVEL

## 2019-11-23 ENCOUNTER — Other Ambulatory Visit: Payer: Self-pay | Admitting: *Deleted

## 2019-11-23 NOTE — Patient Outreach (Signed)
[facility]) Care Management  11/23/2019  [name] 01-Jan-1951 [id number]   Call placed [name] [name] confirm engagement.  Spoke with Tammy, notified that they did initially open case but would be closing.  Needs reportedly exceed their scope, another APS referral has been placed.  Call then placed [name] with Remote [facility] [name] provide update.  Member continues [name] have multiple active APS referrals open.  Outgoing call placed [name], initially state this is not a good time [name] talk but does call this care manager back.  He report he is doing the best he can [name] help member.  State Ms. [name] was in the home today, PT will be there tomorrow.  Was seen by PCP on 10/8, will have follow up on 10/29.  Per note, cardiology and neurology referrals were placed, cardiology appointment on 11/8.  State neurology office called but appointment wasn't made (unclear of the reason).  He does agree [name] have member follow up, this care manager will call [name].  Member and [name] still looking for other options [name] increase care of the member however member's POA has yet [name] provide any financial information needed for placement.  No urgent concerns, will follow up within the next month.  Goals Addressed              This Visit's Progress   .  Per caregiver, patient still want [name] be more independent or be placed in facility (pt-stated)        CARE PLAN ENTRY (see longitudinal plan of care for additional care plan information)  Current Barriers:  . Cognitive Deficits  Nurse Case Manager Clinical Goal(s):  [name] Kitchen Over the next 31 days, patient will verbalize understanding of plan for placement versus living independently . Over the next 28 days, patient will demonstrate improved health management independence as evidenced byperforming more activities independently . Over the next 28 days, patient will work with CM clinical social worker [name] follow up on  placement  Interventions:  . Inter-disciplinary care team collaboration (see longitudinal plan of care) . Collaborated with CSW regarding continued interest in placement . Discussed plans with patient for ongoing care management follow up and provided patient with direct contact information for care management team . Provided patient and/or caregiver with progression information about [facility] (community resource).  Patient Self Care Activities:  . Attends all scheduled provider appointments . Performs ADL's independently . Calls provider office for new concerns or questions . Unable [name] perform IADLs independently  UPDATE: Patient not progressing, POA not willing [name] provide information  Goal not met     .  [facility] - Enhance My Mental Skills        Follow Up Date 11/30   - check out a senior citizen activity program - do word search or crossword puzzles daily - stay in touch with my family and friends    Why is this important?   As we age, or sometimes because we have an illness, it feels like our memory and ability [name] figure things out is not very good.  There are things you can do [name] keep your memory and your thinking as strong as possible.    Notes:     .  [facility]- Keep Myself Safe in Relationships        Follow Up Date 12/15    - ask for help when needed - connect with a support person or friend - eat  healthy meals every day - join a support group - tell someone if I feel unsafe    Why is this important?   Being hurt by someone close [name] you is scary.  Learning [name] take care of yourself emotionally and physically is important, especially during stress.  Getting enough sleep and eating well will help you cope with tough times easier.  Having a plan [name] keep yourself safe is important.     Notes:       [name], RN, MSN [place] [phone]

## 2019-12-04 DIAGNOSIS — I10 Essential (primary) hypertension: Secondary | ICD-10-CM | POA: Diagnosis not present

## 2019-12-07 ENCOUNTER — Other Ambulatory Visit: Payer: Self-pay | Admitting: *Deleted

## 2019-12-07 ENCOUNTER — Ambulatory Visit: Payer: Medicare Other | Admitting: Physician Assistant

## 2019-12-07 NOTE — Patient Outreach (Signed)
[name]) Care Management  12/07/2019  [name] 08-Nov-1950 [id number]   Difficulty of member's case discussed with multidisciplinary team.  Leaders notified that member remains in an unsafe environment despite multiple APS referrals being made.  [facility] not active in case, reportedly due to safety concerns and level of care needed.  Request was made to try to speak with member independently to discuss his personal wishes and goal, particularly surrounding POA/payee and living conditions.  Was scheduled for PCP visit yesterday however appointment was canceled and rescheduled for 12/6.  Will collaborate with CSW in effort to meet member face to face during this visit.    [name], [place], MSN [place] [phone]
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
## 2019-12-17 ENCOUNTER — Ambulatory Visit (INDEPENDENT_AMBULATORY_CARE_PROVIDER_SITE_OTHER): Payer: Medicare Other | Admitting: Cardiology

## 2019-12-17 ENCOUNTER — Encounter: Payer: Self-pay | Admitting: Cardiology

## 2019-12-17 ENCOUNTER — Other Ambulatory Visit: Payer: Self-pay

## 2019-12-17 VITALS — BP 100/70 | HR 101 | Ht 70.0 in | Wt 159.6 lb

## 2019-12-17 DIAGNOSIS — E785 Hyperlipidemia, unspecified: Secondary | ICD-10-CM | POA: Diagnosis not present

## 2019-12-17 DIAGNOSIS — I739 Peripheral vascular disease, unspecified: Secondary | ICD-10-CM | POA: Diagnosis not present

## 2019-12-17 DIAGNOSIS — I4891 Unspecified atrial fibrillation: Secondary | ICD-10-CM | POA: Diagnosis not present

## 2019-12-17 DIAGNOSIS — I10 Essential (primary) hypertension: Secondary | ICD-10-CM

## 2019-12-17 MED ORDER — METOPROLOL TARTRATE 50 MG PO TABS
50.0000 mg | ORAL_TABLET | Freq: Two times a day (BID) | ORAL | 3 refills | Status: DC
Start: 2019-12-17 — End: 2019-12-17

## 2019-12-17 MED ORDER — ATORVASTATIN CALCIUM 80 MG PO TABS: 80.0000 mg | ORAL_TABLET | Freq: Every day | ORAL | 3 refills | Status: AC

## 2019-12-17 MED ORDER — METOPROLOL TARTRATE 50 MG PO TABS: 50.0000 mg | ORAL_TABLET | Freq: Two times a day (BID) | ORAL | 3 refills | Status: AC

## 2019-12-17 MED ORDER — ATORVASTATIN CALCIUM 80 MG PO TABS
80.0000 mg | ORAL_TABLET | Freq: Every day | ORAL | 3 refills | Status: DC
Start: 2019-12-17 — End: 2019-12-17

## 2019-12-17 NOTE — Patient Instructions (Signed)
Medication Instructions:  1) DECREASE Metoprolol Tartrate to 50mg  twice daily 2) DISCONTINUE Furosemide  *If you need a refill on your cardiac medications before your next appointment, please call your pharmacy*   Lab Work: None If you have labs (blood work) drawn today and your tests are completely normal, you will receive your results only by: Marland Kitchen MyChart Message (if you have MyChart) OR . A paper copy in the mail If you have any lab test that is abnormal or we need to change your treatment, we will call you to review the results.   Testing/Procedures: None   Follow-Up: At [facility], you and your health needs are our priority.  As part of our continuing mission to provide you with exceptional heart care, we have created designated Provider Care Teams.  These Care Teams include your primary Cardiologist (physician) and Advanced Practice Providers (APPs -  Physician Assistants and Nurse Practitioners) who all work together to provide you with the care you need, when you need it.  We recommend signing up for the patient portal called "MyChart".  Sign up information is provided on this After Visit Summary.  MyChart is used to connect with patients for Virtual Visits (Telemedicine).  Patients are able to view lab/test results, encounter notes, upcoming appointments, etc.  Non-urgent messages can be sent to your provider as well.   To learn more about what you can do with MyChart, go to [email or web address].    Your next appointment:   1 month(s)  The format for your next appointment:   In Person  Provider:   You may see Gwyndolyn Kaufman, MD or one of the following Advanced Practice Providers on your designated Care Team:    Richardson Dopp, PA-C  Vin Cashmere, [place]    Other Instructions  You have been referred back to the Vascular Specialist.  They will contact you to schedule an appointment.

## 2019-12-21 ENCOUNTER — Other Ambulatory Visit: Payer: Self-pay

## 2019-12-21 ENCOUNTER — Emergency Department (HOSPITAL_COMMUNITY)
Admission: EM | Admit: 2019-12-21 | Discharge: 2019-12-21 | Disposition: A | Discharge status: Home / Self Care | Payer: Self-pay | Attending: Emergency Medicine | Admitting: Emergency Medicine

## 2019-12-21 ENCOUNTER — Emergency Department (HOSPITAL_COMMUNITY): Payer: Medicare Other

## 2019-12-21 ENCOUNTER — Telehealth: Payer: Self-pay | Admitting: Physician Assistant

## 2019-12-21 ENCOUNTER — Other Ambulatory Visit: Payer: Self-pay | Admitting: *Deleted

## 2019-12-21 ENCOUNTER — Encounter (HOSPITAL_COMMUNITY): Payer: Self-pay

## 2019-12-21 DIAGNOSIS — J439 Emphysema, unspecified: Secondary | ICD-10-CM | POA: Diagnosis not present

## 2019-12-21 DIAGNOSIS — R0600 Dyspnea, unspecified: Secondary | ICD-10-CM | POA: Diagnosis not present

## 2019-12-21 DIAGNOSIS — Z742 Need for assistance at home and no other household member able to render care: Secondary | ICD-10-CM | POA: Insufficient documentation

## 2019-12-21 DIAGNOSIS — R0602 Shortness of breath: Secondary | ICD-10-CM | POA: Diagnosis not present

## 2019-12-21 DIAGNOSIS — Z20822 Contact with and (suspected) exposure to covid-19: Secondary | ICD-10-CM | POA: Insufficient documentation

## 2019-12-21 DIAGNOSIS — I10 Essential (primary) hypertension: Secondary | ICD-10-CM | POA: Insufficient documentation

## 2019-12-21 DIAGNOSIS — F17210 Nicotine dependence, cigarettes, uncomplicated: Secondary | ICD-10-CM | POA: Insufficient documentation

## 2019-12-21 DIAGNOSIS — I4891 Unspecified atrial fibrillation: Secondary | ICD-10-CM | POA: Insufficient documentation

## 2019-12-21 DIAGNOSIS — J449 Chronic obstructive pulmonary disease, unspecified: Secondary | ICD-10-CM | POA: Insufficient documentation

## 2019-12-21 DIAGNOSIS — Z79899 Other long term (current) drug therapy: Secondary | ICD-10-CM | POA: Insufficient documentation

## 2019-12-21 DIAGNOSIS — Z7901 Long term (current) use of anticoagulants: Secondary | ICD-10-CM | POA: Insufficient documentation

## 2019-12-21 LAB — CBC WITH DIFFERENTIAL/PLATELET
Abs Immature Granulocytes: 0.03 10*3/uL (ref 0.00–0.07)
Basophils Absolute: 0 10*3/uL (ref 0.0–0.1)
Basophils Relative: 0 %
Eosinophils Absolute: 0 10*3/uL (ref 0.0–0.5)
Eosinophils Relative: 0 %
HCT: 44.2 % (ref 39.0–52.0)
Hemoglobin: 15.1 g/dL (ref 13.0–17.0)
Immature Granulocytes: 0 %
Lymphocytes Relative: 42 %
Lymphs Abs: 3.2 10*3/uL (ref 0.7–4.0)
MCH: 32.8 pg (ref 26.0–34.0)
MCHC: 34.2 g/dL (ref 30.0–36.0)
MCV: 95.9 fL (ref 80.0–100.0)
Monocytes Absolute: 0.4 10*3/uL (ref 0.1–1.0)
Monocytes Relative: 5 %
Neutro Abs: 4 10*3/uL (ref 1.7–7.7)
Neutrophils Relative %: 53 %
Platelets: 185 10*3/uL (ref 150–400)
RBC: 4.61 MIL/uL (ref 4.22–5.81)
RDW: 12.8 % (ref 11.5–15.5)
WBC: 7.7 10*3/uL (ref 4.0–10.5)
nRBC: 0 % (ref 0.0–0.2)

## 2019-12-21 LAB — COMPREHENSIVE METABOLIC PANEL
ALT: 21 U/L (ref 0–44)
AST: 21 U/L (ref 15–41)
Albumin: 4.2 g/dL (ref 3.5–5.0)
Alkaline Phosphatase: 51 U/L (ref 38–126)
Anion gap: 12 (ref 5–15)
BUN: 13 mg/dL (ref 8–23)
CO2: 25 mmol/L (ref 22–32)
Calcium: 9.5 mg/dL (ref 8.9–10.3)
Chloride: 99 mmol/L (ref 98–111)
Creatinine, Ser: 1.15 mg/dL (ref 0.61–1.24)
GFR, Estimated: >60 mL/min (ref >60)
Glucose, Bld: 86 mg/dL (ref 70–99)
Potassium: 3.5 mmol/L (ref 3.5–5.1)
Sodium: 136 mmol/L (ref 135–145)
Total Bilirubin: 1.2 mg/dL (ref 0.3–1.2)
Total Protein: 7 g/dL (ref 6.5–8.1)

## 2019-12-21 LAB — RESPIRATORY PANEL BY RT PCR (FLU A&B, COVID)
Influenza A by PCR: NEGATIVE
Influenza B by PCR: NEGATIVE
SARS Coronavirus 2 by RT PCR: NEGATIVE

## 2019-12-21 LAB — TROPONIN I (HIGH SENSITIVITY): Troponin I (High Sensitivity): 10 ng/L (ref <18)
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
## 2019-12-21 NOTE — ED Notes (Signed)
Pt roommate is continually told that he can not be in the lobby or go back with patient at this time.

## 2019-12-21 NOTE — Discharge Instructions (Signed)
Continue to work with your doctor and social work.

## 2019-12-21 NOTE — Telephone Encounter (Signed)
[name] called office saying [name] is complaining of bilateral arm pain and "isnt feeling right".   I advised Shanon Brow to call 911 and have EMS come to patient to assess.  Due to mental status of [name] who is patient care taker I called EMS and am sending them to patient. AS, CMA
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
## 2019-12-21 NOTE — Progress Notes (Signed)
CSW provided Pt with cab voucher to return home

## 2019-12-21 NOTE — Patient Outreach (Signed)
[facility]) Care Management  12/21/2019  [name] 12/02/1950 [id number]   Noted per chart that member had office visit with cardiology on 11/8.  Per notes, member continues to have difficulty communicating but expressed he does not want anyone involved in his care, still looking to be placed in a better living environment.  Outgoing call placed to PCP office to discuss upcoming office visit and plan to have CSW come speak with member privately at that time, office closes at noon on Fridays.  Will call again next week.  Call placed to Lattie Haw, RN with [facility] to follow up on update of member's situation, voice message left, will await call back.  Also noted that member is current in the ED.  This care manager will follow up on member's status within the next week.  [name], [place], MSN [place] [phone]
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
## 2019-12-21 NOTE — ED Notes (Signed)
Patient verbalizes understanding of discharge instructions. Opportunity for questioning and answers were provided. Armband removed by staff, pt discharged from ED stable & ambulatory  

## 2019-12-21 NOTE — Social Work (Signed)
MSW Intern met with patient regarding discharge. Patient in agreement to go home via cab. Patient reports he is moving into another house soon.   MSW intern left VM with APS worker Adriene Carmichael [phone]'  to let her know of patient's ED visit and discharge plan.

## 2019-12-21 NOTE — Care Management (Signed)
ED evaluation resulted in no significant finding medically cleared for discharged as per EDP.  ED CSW updated, CM will update Public affairs consultant and with [facility], and [facility] as well. No further ED RNCM needs identified.

## 2019-12-24 ENCOUNTER — Other Ambulatory Visit: Payer: Self-pay | Admitting: *Deleted

## 2019-12-24 NOTE — Patient Outreach (Signed)
[facility]) Care Management  12/24/2019  SANTI TROUNG 08-16-1950 [id number]   Incoming call received from L. Apple, [facility].  Update on member's case received, APS case remains open, continue to work on [employer] living conditions.  Will no longer reach out to Mr. Delane Ginger roommate, as it is noted that he does not want him involved in his care.  Will follow up with Ms. Apple within the next 2 weeks.  [name], [place], MSN [place] [phone]

## 2019-12-31 ENCOUNTER — Other Ambulatory Visit: Payer: Self-pay | Admitting: *Deleted

## 2019-12-31 NOTE — Patient Outreach (Signed)
[facility]) Care Management  12/31/2019  [name] 1950-10-23 [id number]   Voice message received from L. Apple, RN with [facility] requesting help with placement.  DSS/APS remain active and also working to help remove member from the home.  Call placed to Galliano Caldwell to request assistance per Ms. Apple's request, she will follow up directly with her.  Call placed to Ms. Apple to provide update and notify that CSW will be contacting her, however this care manager advised that CSW with [facility] will now be taking over process for placement.  [facility] CSW's notified, Ms. Apple will contact this care manager if further assistance is needed.  Will follow up with Ms. Apple within the next week.  [name], [place], MSN [place] [phone]
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
## 2020-01-08 ENCOUNTER — Other Ambulatory Visit: Payer: Self-pay | Admitting: *Deleted

## 2020-01-08 NOTE — Patient Outreach (Signed)
[facility]) Care Management  01/08/2020  FELTON BUCZYNSKI [birth year] [id number]   Call placed to L. Apple with [facility] to follow up on member's status and plan for removal from current living situation.  She report she has home visit planned for tomorrow, will follow up with this care manager once visit is complete.  Will await call back, will follow up with Ms. Apple within the next 2 weeks.  [name], [place], MSN [place] [phone]

## 2020-01-10 ENCOUNTER — Other Ambulatory Visit: Payer: Self-pay | Admitting: *Deleted

## 2020-01-10 NOTE — Patient Outreach (Signed)
[facility]) Care Management  01/10/2020  LUZ BURCHER 12-Feb-1950 [id number]  [facility] CM Multidisciplinary Case Discussion re:  [name], 69 y/o male, active with [facility] RN CM post- CVA; patient's case was updated during conference today; made multi-disciplinary team aware of updates from [place], including that APS is currently working on placing patient in facility; guardianship papers have been served; [facility] team remains active and involved in process to facilitate placement.  Confirmed that APS team has requested no additional assistance from [place] team.  Confirmed that [place] continues working with Remote [facility] Team to follow patient, but is not actively engaging with patient so as to avoid overwhelming patient/ overall efforts of APS/ Remote health teams.  Plan:  [facility] RN CM to close patient case  Oneta Rack, RN, BSN, [employer] Care Management  [phone]

## 2020-01-11 ENCOUNTER — Other Ambulatory Visit: Payer: Self-pay | Admitting: *Deleted

## 2020-01-11 NOTE — Patient Outreach (Signed)
[facility]) Care Management  01/11/2020  [name] December 05, 1950 [id number]   Call placed to L. Apple, [facility].  Confirmed that [facility] and DSS are working on placement for member.  Notified her that [facility] will close case at this time but advised to contact this care manager with any questions/needs.  [name], [place], MSN [place] [phone]
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
## 2020-01-14 ENCOUNTER — Ambulatory Visit: Payer: Medicare Other | Admitting: Physician Assistant

## 2020-01-14 ENCOUNTER — Other Ambulatory Visit: Payer: Self-pay | Admitting: *Deleted

## 2020-01-14 ENCOUNTER — Encounter: Payer: Self-pay | Admitting: *Deleted

## 2020-01-14 ENCOUNTER — Telehealth: Payer: Self-pay | Admitting: Physician Assistant
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
## 2020-01-14 NOTE — Patient Outreach (Signed)
[facility]) Care Management  01/14/2020  [name] [birth year] [id number]   Incoming call received from L. Apple, [facility].  State member was to have PCP visit today but didn't have transportation.  This visit was rescheduled but he is scheduled for cardiology visit tomorrow at 120 pm.  She is requesting help with transportation to this visit.  Referral placed to CSW for assistance.    [name], [place], MSN [place] [phone]

## 2020-01-15 ENCOUNTER — Other Ambulatory Visit: Payer: Self-pay

## 2020-01-15 ENCOUNTER — Ambulatory Visit: Payer: Self-pay | Admitting: Cardiology

## 2020-01-15 DIAGNOSIS — I739 Peripheral vascular disease, unspecified: Secondary | ICD-10-CM

## 2020-01-23 ENCOUNTER — Encounter (HOSPITAL_COMMUNITY): Payer: Self-pay

## 2020-01-23 ENCOUNTER — Ambulatory Visit: Payer: Self-pay

## 2020-02-07 ENCOUNTER — Institutional Professional Consult (permissible substitution): Payer: Medicare Other | Admitting: Neurology

## 2020-02-07 ENCOUNTER — Encounter: Payer: Self-pay | Admitting: Neurology

## 2020-02-13 ENCOUNTER — Other Ambulatory Visit: Payer: Self-pay

## 2020-02-13 ENCOUNTER — Encounter: Payer: Self-pay | Admitting: Physician Assistant

## 2020-02-13 ENCOUNTER — Ambulatory Visit (INDEPENDENT_AMBULATORY_CARE_PROVIDER_SITE_OTHER): Payer: Self-pay | Admitting: Physician Assistant

## 2020-02-13 VITALS — BP 109/76 | HR 76 | Ht 70.0 in | Wt 153.8 lb

## 2020-02-13 DIAGNOSIS — I4891 Unspecified atrial fibrillation: Secondary | ICD-10-CM

## 2020-02-13 DIAGNOSIS — F332 Major depressive disorder, recurrent severe without psychotic features: Secondary | ICD-10-CM

## 2020-02-13 DIAGNOSIS — I10 Essential (primary) hypertension: Secondary | ICD-10-CM

## 2020-02-13 DIAGNOSIS — Z716 Tobacco abuse counseling: Secondary | ICD-10-CM

## 2020-02-13 DIAGNOSIS — E785 Hyperlipidemia, unspecified: Secondary | ICD-10-CM

## 2020-02-13 NOTE — Progress Notes (Signed)
Established Patient Office Visit  Subjective:  Patient ID: EDREI LOEBER, male    DOB: [birth year]  Age: 70 y.o. MRN: [id number]  CC:  Chief Complaint  Patient presents with  . Hypertension  . Hyperlipidemia  . Depression    HPI [name] presents for follow up on hypertension, hyperlipidemia and depression. Patient did not bring in medications today and does not know which medications he is taking.  HTN: Pt denies new onset chest pain, palpitations, arrhythmia, or increased swelling from baseline. Reports all his medications were changed and one of them is making him sick, feels dizzy and has to lay down for a couple of minutes which helps improve his symptoms. Sometimes misses some of his medications because he forgets. His shortness of breath remains the same. He went to ED for evaluation 12/21/19.  HLD: Pt does not know which medications he is taking. Has some assistance with groceries.   Depression: States his home situation is the same- "allright for now."  Wants to go back to work. 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 Continues to feel depressed, wishes he would get sick with Covid-19. Has had thoughts of stabbing himself, denies any attempts. Continues to have no cell phone.   Tobacco abuse: States currently smokes about 1/2 PPD. In the past was a heavier smoker. Would like to quit but is currently not able to with his current situation.   Past Medical History:  Diagnosis Date  . AAA (abdominal aortic aneurysm) ([place])    per last CT 03-26-2016---  3.9cm  . Anticoagulated on Coumadin   . Arthritis of spine   . Benign localized prostatic hyperplasia with lower urinary tract symptoms (LUTS)   . Chronic atrial fibrillation ([place]) cardiolgoist -- dr Stanford Breed   dx 2009  . Complication of anesthesia    per pt today (07-08-2016) had problem after having anesthesia 12/ 2016 surgery [facility] "not good" went to ED ( in epic ED visit 01/ 2017 dx severe episode of major depression  disorder and admitted to [facility]  . COPD with emphysema ([place])   . Dyspnea on exertion   . Full dentures   . GERD (gastroesophageal reflux disease)   . Hematuria   . History of amaurosis fugax    right eye 02-08-2015 -- resolved /  documented in epic possible left eye amaurosis fugax 11/ 2008 (pt denies) / per last MRI show previous bilateral occipital lobe infarcts  . History of concussion    as child  . History of CVA (cerebrovascular accident) 10/ 2016  and per pt Dec 2017 in [place]   neurologist-  dr Leonie Man-- per note silent infarcts on MRI-- multiple acute infarcts bilateral cerebullm, right temporal lobe, and bilateral occipital lobes due to embolic phenomena (atrial fib.)  . History of seizures as a child   . Hyperlipidemia   . Hypertension   . Left arm numbness    occasional numbess post residual axilla arterial occlusion and surgery  . Major depressive disorder    hx severe episode without psychosis 02/2015 w/ homicidal ideation  . Narcotic dependence ([place])   . PAD (peripheral artery disease) ([place])    followed by dr fields-- s/p  left axilla to branchial and left axilla to ulnar bypass graft due to ischemic hand  . Pulmonary nodule    per CT 03-26-2016  right lower lobe  . PVD (peripheral vascular disease) ([place])   . Right ureteral stone   . Senile purpura ([place])   .  Weakness of left arm    residual from axilla arterial occlusion and post surgery  . Wears glasses     Past Surgical History:  Procedure Laterality Date  . ARCH AORTOGRAM N/A 04/05/2014   Procedure: ARCH AORTOGRAM, FIRST ORDER CATHETERIZATION LEFT SUBCLAVIAN ARTERY;  Surgeon: Sherren Kerns, MD;  Location: [facility] OR;  Service: Vascular;  Laterality: N/A;  . AXILLARY-FEMORAL BYPASS GRAFT Left 04/08/2014   Procedure: LEFT AXILLARY ARTERY TO BRACHIAL ARTERY BYPASS USING NON REVERSE LEFT GREATER SAPHENOUS VEIN ,LIGATION OF LEFT AXILLARY ARTERY ANEURYSM;  Surgeon: Sherren Kerns, MD;  Location: [facility] OR;  Service:  Vascular;  Laterality: Left;  . BYPASS AXILLA/BRACHIAL ARTERY Left 01/27/2015   Procedure: LEFT BRACHIAL-ULAR ARTERY BYPASS USING GREATER SAPHENOUS VEIN;  Surgeon: Sherren Kerns, MD;  Location: [facility] OR;  Service: Vascular;  Laterality: Left;  . CYSTOSCOPY WITH RETROGRADE PYELOGRAM, URETEROSCOPY AND STENT PLACEMENT Right 07/17/2016   Procedure: CYSTOSCOPY WITH RETROGRADE PYELOGRAM, URETEROSCOPY AND STENT PLACEMENT,LASER;  Surgeon: Jerilee Field, MD;  Location: [facility] ORS;  Service: Urology;  Laterality: Right;  . CYSTOSCOPY/URETEROSCOPY/HOLMIUM LASER/STENT PLACEMENT Right 07/13/2016   Procedure: CYSTOSCOPY, RIGHT URETEROSCOPY RETROGRADE PYELOGRAM;  Surgeon: Ihor Gully, MD;  Location[facility];  Service: Urology;  Laterality: Right;  . EMBOLECTOMY Left 04/05/2014   Procedure: THROMBO ENDARTERECTOMY OF LEFT BRACHIAL, RADIAL AND ULNAR ARTERY,  Left Radial artery cut down and radial artery thrombectomy.;  Surgeon: Sherren Kerns, MD;  Location: [facility] OR;  Service: Vascular;  Laterality: Left;  . ESOPHAGOGASTRODUODENOSCOPY N/A 01/30/2019   Procedure: ESOPHAGOGASTRODUODENOSCOPY (EGD);  Surgeon: Diamantina Monks, MD;  Location: [facility] ENDOSCOPY;  Service: General;  Laterality: N/A;  . facial cyst Right    cyst removal.   . IR REPLC GASTRO/COLONIC TUBE PERCUT W/FLUORO  02/17/2019  . PATCH ANGIOPLASTY Left 04/05/2014   Procedure: LEFT ARM VEIN PATCH ANGIOPLASTY;  Surgeon: Sherren Kerns, MD;  Location: [facility] OR;  Service: Vascular;  Laterality: Left;  . PEG PLACEMENT N/A 01/30/2019   Procedure: PERCUTANEOUS ENDOSCOPIC GASTROSTOMY (PEG) PLACEMENT;  Surgeon: Diamantina Monks, MD;  Location: [facility] ENDOSCOPY;  Service: General;  Laterality: N/A;  . PERIPHERAL VASCULAR CATHETERIZATION N/A 01/22/2015   Procedure: Aortic Arch Angiography;  Surgeon: Sherren Kerns, MD;  Location: [facility] INVASIVE CV LAB;  Service: Cardiovascular;  Laterality: N/A;  . THROMBECTOMY BRACHIAL ARTERY Left 11/20/2014   Procedure: 1.   Thrombectomy Left Axilo-Brachial Bypass  2.  Thromboendarterecotmy of Left Brachial Artery with Fogarty Thrombectomy of Radial and Ulnar Arteries with Dacron patch angioplasty Left Brachial Artery. 3. Intraoperative  Arteriogram times four.;  Surgeon: Pryor Ochoa, MD;  Location: [facility] OR;  Service: Vascular;  Laterality: Left;  . TRANSTHORACIC ECHOCARDIOGRAM  11/25/2014   moderate concentric LVH, ef 60-65%, unable to evaluation LVDF due to atrial fib/  mild MR/  severe LAE  . VEIN HARVEST Left 04/08/2014   Procedure: LEFT GREATER SAPHENOUS VEIN HARVEST;  Surgeon: Sherren Kerns, MD;  Location: [facility] OR;  Service: Vascular;  Laterality: Left;  Marland Kitchen VEIN HARVEST Right 01/27/2015   Procedure: RIGHT GREATER SAPHENOUS VEIN HARVEST;  Surgeon: Sherren Kerns, MD;  Location: [facility] OR;  Service: Vascular;  Laterality: Right;    Family History  Problem Relation Age of Onset  . Hypertension Mother   . Lung cancer Mother   . Stomach cancer Father        died age 66  . Cancer Sister   . Early death Neg Hx   . Heart disease Neg Hx   .  Hyperlipidemia Neg Hx   . Kidney disease Neg Hx   . Stroke Neg Hx     Social History   Socioeconomic History  . Marital status: Divorced    Spouse name: Not on file  . Number of children: 1  . Years of education: 8  . Highest education level: 8th grade  Occupational History  . 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 Smoking status: Current Every Day Smoker    Packs/day: 2.00    Years: 51.00    Pack years: 102.00    Types: Cigarettes  . Smokeless tobacco: Never Used  . Tobacco comment: per pt 07-08-2016 down to 2ppd from 4 ppd (quit one time for 4 years since started at age 44)  53 Use  . Vaping Use: Never used  Substance and Sexual Activity  . Alcohol use: Yes    Alcohol/week: 0.0 standard drinks    Comment: occasional   . Drug use: No    Comment: per pt 07-08-2016 hx drug use stopped 1970's  . Sexual activity: Not  Currently  Other Topics Concern  . Not on file  Social History Narrative  . Not on file   Social Determinants of Health   Financial Resource Strain: Medium Risk  . Difficulty of Paying Living Expenses: Somewhat hard  Food Insecurity: No Food Insecurity  . Worried About Charity fundraiser in the Last Year: Never true  . Ran Out of Food in the Last Year: Never true  Transportation Needs: Unmet Transportation Needs  . Lack of Transportation (Medical): Yes  . Lack of Transportation (Non-Medical): No  Physical Activity: Inactive  . Days of Exercise per Week: 0 days  . Minutes of Exercise per Session: 0 min  Stress: Stress Concern Present  . Feeling of Stress : To some extent  Social Connections: Socially Isolated  . Frequency of Communication with Friends and Family: More than three times a week  . Frequency of Social Gatherings with Friends and Family: More than three times a week  . Attends Religious Services: Never  . Active Member of Clubs or Organizations: No  . Attends Archivist Meetings: Never  . Marital Status: Divorced  Human resources officer Violence: Not At Risk  . Fear of Current or Ex-Partner: No  . Emotionally Abused: No  . Physically Abused: No  . Sexually Abused: No    Outpatient Medications Prior to Visit  Medication Sig Dispense Refill  . acetaminophen (TYLENOL) 500 MG tablet Take 500 mg by mouth every 6 (six) hours as needed for mild pain.     Marland Kitchen alum & mag hydroxide-simeth (MAALOX/MYLANTA) 200-200-20 MG/5ML suspension Take 30 mLs by mouth every 6 (six) hours as needed for indigestion or heartburn. 355 mL 0  . Amino Acids-Protein Hydrolys (FEEDING SUPPLEMENT, PRO-STAT SUGAR FREE 64,) LIQD Place 30 mLs into feeding tube 2 (two) times daily. 887 mL 0  . amiodarone (PACERONE) 200 MG tablet Place 1 tablet (200 mg total) into feeding tube daily. 30 tablet 1  . apixaban (ELIQUIS) 5 MG TABS tablet Place 1 tablet (5 mg total) into feeding tube 2 (two) times daily.  60 tablet 1  . atorvastatin (LIPITOR) 80 MG tablet Take 1 tablet (80 mg total) by mouth daily. 90 tablet 3  . diltiazem (CARDIZEM CD) 120 MG 24 hr capsule Take 1 capsule (120 mg total) by mouth daily. 30 capsule 1  . divalproex (DEPAKOTE SPRINKLE) 125 MG capsule Take 4 capsules (500 mg  total) by mouth every 12 (twelve) hours. [id number] capsule 1  . folic acid (FOLVITE) 1 MG tablet Take 2 tablets (2 mg total) by mouth daily. 30 tablet 1  . metoprolol tartrate (LOPRESSOR) 50 MG tablet Take 1 tablet (50 mg total) by mouth 2 (two) times daily. 180 tablet 3  . mirtazapine (REMERON) 15 MG tablet Take 1 tablet by mouth at bedtime.     Marland Kitchen mouth rinse LIQD solution 15 mLs by Mouth Rinse route 2 (two) times daily. 354 mL 1  . naproxen (NAPROSYN) 250 MG tablet Take by mouth 2 (two) times daily with a meal. X 10 days     . QUEtiapine (SEROQUEL) 50 MG tablet Take 3 tablets (150 mg total) by mouth at bedtime. (Patient taking differently: Take 150 mg by mouth 3 (three) times daily.) 30 tablet 1  . traMADol (ULTRAM) 50 MG tablet Take 50-100 mg by mouth every 6 (six) hours as needed for moderate pain.     . vitamin B-12 1000 MCG tablet Place 1 tablet (1,000 mcg total) into feeding tube daily. 30 tablet 1   No facility-administered medications prior to visit.    Allergies  Allergen Reactions  . Oxycodone Itching    ROS Review of Systems Review of Systems:  A fourteen system review of systems was performed and found to be positive as per HPI.    Objective:    Physical Exam General:  Less agitated than previous visits, cooperative, thin and frail.  Neuro:  Alert and oriented,  extra-ocular muscles intact, expressive aphasia   HEENT:  Normocephalic, atraumatic, neck supple, no JVD Skin:  Warm, dry. Cardiac: Irregular rhythm w/o murmur. Respiratory:  ECTA w/o wheezing, Not using accessory muscles, speaking in full sentences- unlabored. Vascular:  No gross edema.  Psych:   Judgement and insight good,  depressed mood. Normal behavior.   BP 109/76   Pulse 76   Ht 5\' 10"  (1.778 m)   Wt 153 lb 12.8 oz (69.8 kg)   SpO2 96%   BMI 22.07 kg/m  Wt Readings from Last 3 Encounters:  02/13/20 153 lb 12.8 oz (69.8 kg)  12/21/19 159 lb (72.1 kg)  12/17/19 159 lb 9.6 oz (72.4 kg)     Health Maintenance Due  Topic Date Due  . URINE MICROALBUMIN  Never done  . COVID-19 Vaccine (1) Never done  . COLONOSCOPY (Pts 45-46yrs Insurance coverage will need to be confirmed)  Never done  . INFLUENZA VACCINE  09/09/2019  . TETANUS/TDAP  11/27/2019    There are no preventive care reminders to display for this patient.  Lab Results  Component Value Date   TSH 1.950 11/16/2019   Lab Results  Component Value Date   WBC 7.7 12/21/2019   HGB 15.1 12/21/2019   HCT 44.2 12/21/2019   MCV 95.9 12/21/2019   PLT 185 12/21/2019   Lab Results  Component Value Date   NA 136 12/21/2019   K 3.5 12/21/2019   CO2 25 12/21/2019   GLUCOSE 86 12/21/2019   BUN 13 12/21/2019   CREATININE 1.15 12/21/2019   BILITOT 1.2 12/21/2019   ALKPHOS 51 12/21/2019   AST 21 12/21/2019   ALT 21 12/21/2019   PROT 7.0 12/21/2019   ALBUMIN 4.2 12/21/2019   CALCIUM 9.5 12/21/2019   ANIONGAP 12 12/21/2019   GFR 96.53 06/20/2013   Lab Results  Component Value Date   CHOL 182 11/16/2019   Lab Results  Component Value Date   HDL 48 11/16/2019  Lab Results  Component Value Date   LDLCALC 114 (H) 11/16/2019   Lab Results  Component Value Date   TRIG 109 11/16/2019   Lab Results  Component Value Date   CHOLHDL 3.8 11/16/2019   Lab Results  Component Value Date   HGBA1C 5.1 01/19/2019      Assessment & Plan:   Problem List Items Addressed This Visit      Cardiovascular and Mediastinum   HYPERTENSION, BENIGN ESSENTIAL   Atrial fibrillation with RVR ([place])     Other   Major depressive disorder, recurrent, severe without psychotic features ([place])   Hyperlipemia - Primary    Other Visit Diagnoses     Tobacco abuse counseling         Atrial fibrillation with RVR: -Followed by Cardiology. Reviewed consult note 12/17/19. -Continue current medication regimen. -On exam in Atrial fibrillation and concerned with non-adherence with medications. Encouraged to use pill box as demonstrated by remote [facility].   Hypertension: -Controlled. -Continue current medication regimen. Advised to bring medications next office visit. Will update medication list based on most recent home health visit report.  -Recommend to stay well hydrated. -Last CMP wnl. -Will continue to monitor.  MDD, recurrent, severe without psychotic features: -Patient will benefit from psychiatry and psychology referrals, however, patient has no cell phone or other communication for virtual/telehealth visits. -Continue current medication regimen. -Will continue to work with CSW/APS and [facility] on home situation.   Hyperlipidemia: -Last lipid panel: Total cholesterol 182, triglycerides 109, HDL 48, LDL 114 -Continue current medication regimen. Last hepatic function wnl. -Will continue to monitor.  Tobacco abuse counseling:  -Smoking cessation instruction/counseling given:  counseled patient on the dangers of tobacco use, advised patient to stop smoking, and reviewed strategies to maximize success  No orders of the defined types were placed in this encounter.   Follow-up: Return in about 2 months (around 04/12/2020) for HTN, HLD, Mood.   Note:  This note was prepared with assistance of Dragon voice recognition software. Occasional wrong-word or sound-a-like substitutions may have occurred due to the inherent limitations of voice recognition software.  Lorrene Reid, PA-C

## 2020-02-18 ENCOUNTER — Institutional Professional Consult (permissible substitution): Payer: Medicare Other | Admitting: Neurology

## 2020-02-18 ENCOUNTER — Encounter: Payer: Self-pay | Admitting: Neurology

## 2020-03-19 ENCOUNTER — Telehealth: Payer: Self-pay | Admitting: Physician Assistant

## 2020-03-19 NOTE — Telephone Encounter (Signed)
See other msg from today. AS, CMA

## 2020-03-19 NOTE — Telephone Encounter (Signed)
Lanae called for [name]. His FL 2 expired on 03-16-20 and it is used for placement. She would like an updated one with his medications on the FL 2. Her phone is [phone] and fax is [phone]. She is from [place]. Thanks

## 2020-03-19 NOTE — Telephone Encounter (Signed)
Per Lanae FL2 form expires every 30 days until patient is placed.   FL2 form signed by provider and faxed to [place] at below fax number. Original on my desk. AS, CMA

## 2020-03-27 ENCOUNTER — Telehealth: Payer: Self-pay | Admitting: Physician Assistant

## 2020-03-27 NOTE — Telephone Encounter (Signed)
Spoke with [name] and advised FL2 forms were filled out on 03/19/20 and faxed to [employer]. [name] requested this form be faxed to her. That has been done. AS, CMA

## 2020-03-27 NOTE — Telephone Encounter (Signed)
Patient's social worker called stating she needs an FL 2 form for [name]. She needs it for placement as patient is in a bad situation and needs to be placed quickly. Thanks

## 2020-04-14 ENCOUNTER — Ambulatory Visit: Payer: Self-pay | Admitting: Physician Assistant

## 2020-04-14 ENCOUNTER — Telehealth: Payer: Self-pay | Admitting: Physician Assistant

## 2020-04-14 NOTE — Telephone Encounter (Signed)
Please have them fax over a new FL2 form. AS, CMA

## 2020-04-14 NOTE — Telephone Encounter (Signed)
Patient's guardian Education officer, museum called in requesting a new FL2 for placement for patient. Please advise, thanks.

## 2020-04-14 NOTE — Telephone Encounter (Signed)
Contacted and they will be faxing over a new form.

## 2020-04-15 ENCOUNTER — Emergency Department (HOSPITAL_COMMUNITY)
Admission: EM | Admit: 2020-04-15 | Discharge: 2020-04-16 | Disposition: A | Discharge status: Home / Self Care | Payer: Self-pay | Attending: Emergency Medicine | Admitting: Emergency Medicine

## 2020-04-15 ENCOUNTER — Ambulatory Visit (HOSPITAL_COMMUNITY)
Admission: EM | Admit: 2020-04-15 | Discharge: 2020-04-15 | Disposition: A | Discharge status: Other Acute Inpatient Hospital | Payer: No Payment, Other

## 2020-04-15 ENCOUNTER — Other Ambulatory Visit: Payer: Self-pay

## 2020-04-15 ENCOUNTER — Encounter (HOSPITAL_COMMUNITY): Payer: Self-pay | Admitting: Registered Nurse

## 2020-04-15 ENCOUNTER — Encounter (HOSPITAL_COMMUNITY): Payer: Self-pay

## 2020-04-15 DIAGNOSIS — R45851 Suicidal ideations: Secondary | ICD-10-CM | POA: Insufficient documentation

## 2020-04-15 DIAGNOSIS — R519 Headache, unspecified: Secondary | ICD-10-CM | POA: Insufficient documentation

## 2020-04-15 DIAGNOSIS — F919 Conduct disorder, unspecified: Secondary | ICD-10-CM | POA: Insufficient documentation

## 2020-04-15 DIAGNOSIS — F0390 Unspecified dementia without behavioral disturbance: Secondary | ICD-10-CM | POA: Insufficient documentation

## 2020-04-15 DIAGNOSIS — Z7901 Long term (current) use of anticoagulants: Secondary | ICD-10-CM | POA: Insufficient documentation

## 2020-04-15 DIAGNOSIS — I10 Essential (primary) hypertension: Secondary | ICD-10-CM | POA: Insufficient documentation

## 2020-04-15 DIAGNOSIS — F17210 Nicotine dependence, cigarettes, uncomplicated: Secondary | ICD-10-CM | POA: Insufficient documentation

## 2020-04-15 DIAGNOSIS — F322 Major depressive disorder, single episode, severe without psychotic features: Secondary | ICD-10-CM | POA: Diagnosis present

## 2020-04-15 DIAGNOSIS — J449 Chronic obstructive pulmonary disease, unspecified: Secondary | ICD-10-CM | POA: Insufficient documentation

## 2020-04-15 DIAGNOSIS — Z79899 Other long term (current) drug therapy: Secondary | ICD-10-CM | POA: Insufficient documentation

## 2020-04-15 DIAGNOSIS — Z20822 Contact with and (suspected) exposure to covid-19: Secondary | ICD-10-CM | POA: Insufficient documentation

## 2020-04-15 DIAGNOSIS — F32A Depression, unspecified: Secondary | ICD-10-CM | POA: Insufficient documentation

## 2020-04-15 LAB — COMPREHENSIVE METABOLIC PANEL
ALT: 49 U/L — ABNORMAL HIGH (ref 0–44)
AST: 33 U/L (ref 15–41)
Albumin: 4.4 g/dL (ref 3.5–5.0)
Alkaline Phosphatase: 48 U/L (ref 38–126)
Anion gap: 10 (ref 5–15)
BUN: 11 mg/dL (ref 8–23)
CO2: 25 mmol/L (ref 22–32)
Calcium: 9.7 mg/dL (ref 8.9–10.3)
Chloride: 102 mmol/L (ref 98–111)
Creatinine, Ser: 1.02 mg/dL (ref 0.61–1.24)
GFR, Estimated: >60 mL/min (ref >60)
Glucose, Bld: 111 mg/dL — ABNORMAL HIGH (ref 70–99)
Potassium: 3.6 mmol/L (ref 3.5–5.1)
Sodium: 137 mmol/L (ref 135–145)
Total Bilirubin: 1.1 mg/dL (ref 0.3–1.2)
Total Protein: 7.3 g/dL (ref 6.5–8.1)

## 2020-04-15 LAB — CBC
HCT: 47 % (ref 39.0–52.0)
Hemoglobin: 15.4 g/dL (ref 13.0–17.0)
MCH: 32 pg (ref 26.0–34.0)
MCHC: 32.8 g/dL (ref 30.0–36.0)
MCV: 97.7 fL (ref 80.0–100.0)
Platelets: 287 10*3/uL (ref 150–400)
RBC: 4.81 MIL/uL (ref 4.22–5.81)
RDW: 14 % (ref 11.5–15.5)
WBC: 7.9 10*3/uL (ref 4.0–10.5)
nRBC: 0 % (ref 0.0–0.2)

## 2020-04-15 LAB — ETHANOL: Alcohol, Ethyl (B): <10 mg/dL (ref <10)

## 2020-04-15 LAB — RAPID URINE DRUG SCREEN, HOSP PERFORMED
Amphetamines: NOT DETECTED
Barbiturates: NOT DETECTED
Benzodiazepines: NOT DETECTED
Cocaine: NOT DETECTED
Opiates: POSITIVE — AB
Tetrahydrocannabinol: NOT DETECTED

## 2020-04-15 LAB — RESP PANEL BY RT-PCR (FLU A&B, COVID) ARPGX2
Influenza A by PCR: NEGATIVE
Influenza B by PCR: NEGATIVE
SARS Coronavirus 2 by RT PCR: NEGATIVE

## 2020-04-15 LAB — ACETAMINOPHEN LEVEL: Acetaminophen (Tylenol), Serum: <10 ug/mL — ABNORMAL LOW (ref 10–30)

## 2020-04-15 LAB — SALICYLATE LEVEL: Salicylate Lvl: <7 mg/dL — ABNORMAL LOW (ref 7.0–30.0)

## 2020-04-15 MED ORDER — MIRTAZAPINE 15 MG PO TABS
15.0000 mg | ORAL_TABLET | Freq: Every day | ORAL | Status: DC
  Administered 2020-04-15: 15 mg via ORAL
  Filled 2020-04-15 (×3): qty 1

## 2020-04-15 MED ORDER — METOPROLOL TARTRATE 25 MG PO TABS
50.0000 mg | ORAL_TABLET | Freq: Two times a day (BID) | ORAL | Status: DC
  Administered 2020-04-15 – 2020-04-16 (×2): 50 mg via ORAL
  Filled 2020-04-15 (×2): qty 2

## 2020-04-15 MED ORDER — ATORVASTATIN CALCIUM 40 MG PO TABS
40.0000 mg | ORAL_TABLET | Freq: Every day | ORAL | Status: DC
  Administered 2020-04-16: 40 mg via ORAL
  Filled 2020-04-15: qty 1

## 2020-04-15 MED ORDER — AMIODARONE HCL 200 MG PO TABS: 200.0000 mg | ORAL_TABLET | Freq: Every day | ORAL | Status: DC

## 2020-04-15 MED ORDER — ACETAMINOPHEN 500 MG PO TABS: 500.0000 mg | ORAL_TABLET | Freq: Four times a day (QID) | ORAL | Status: DC | PRN

## 2020-04-15 MED ORDER — DILTIAZEM HCL ER COATED BEADS 120 MG PO CP24
120.0000 mg | ORAL_CAPSULE | Freq: Every day | ORAL | Status: DC
  Administered 2020-04-16: 120 mg via ORAL
  Filled 2020-04-15: qty 1

## 2020-04-15 MED ORDER — AMIODARONE HCL 200 MG PO TABS
200.0000 mg | ORAL_TABLET | Freq: Every day | ORAL | Status: DC
  Administered 2020-04-16: 200 mg via ORAL
  Filled 2020-04-15: qty 1

## 2020-04-15 NOTE — ED Notes (Signed)
[employer] dispatch called to transport pt to [facility] for medical clearance.

## 2020-04-15 NOTE — Telephone Encounter (Signed)
FL2 given to [facility] to fax to [place]. AS, CMA

## 2020-04-15 NOTE — ED Provider Notes (Signed)
[facility] Medical Screening Exam  Patient Name: [name] MRN: [id number] Date of Evaluation: 04/15/20 Chief Complaint:   Diagnosis:  Final diagnoses:  None    History of Present illness: [name] is a 70 y.o. male patient presented to [facility] as a walk in with complaints of "not wanting to be here any more.   [name], 70 y.o., male patient seen face to face by this provider, consulted with Dr. Serafina Mitchell; and chart reviewed on 04/15/20.  On evaluation [name] reports he is having suicidal thoughts of not wanting to be here.  Patient states he is living in a motel alone and has home health nurse that is suppose to come set up his medications weekly (Ms. Apple) but she hasn't came so he hasn't taken any of his medications.  Patient states that he is having a headache and appears malnourished.  States there has been a decrease in appetite mainly because he is unable to cook for himself and unable to go get food on his own.  States that he is able to bath and dress himself. Because of patients previous history of stroke and hypertension; off of his medication for last 3 days and now complaining of headache and suicidal thought without plan and no support system.  Sending [facility] for medical clarence.    Psychiatric Specialty Exam  Presentation  General Appearance:No data recorded Eye Contact:No data recorded Speech:No data recorded Speech Volume:No data recorded Handedness:No data recorded  Mood and Affect  Mood:No data recorded Affect:No data recorded  Thought Process  Thought Processes:No data recorded Descriptions of Associations:No data recorded Orientation:No data recorded Thought Content:No data recorded Hallucinations:No data recorded Ideas of Reference:No data recorded Suicidal Thoughts:No data recorded Homicidal Thoughts:No data recorded  Sensorium  Memory:No data recorded Judgment:No data recorded Insight:No  data recorded  Executive Functions  Concentration:No data recorded Attention Span:No data recorded Recall:No data recorded Fund of Knowledge:No data recorded Language:No data recorded  Psychomotor Activity  Psychomotor Activity:No data recorded  Assets  Assets:No data recorded  Sleep  Sleep:No data recorded Number of hours: No data recorded  No data recorded  Physical Exam: Physical Exam Constitutional:      General: He is not in acute distress.    Appearance: He is ill-appearing.  Eyes:     Pupils: Pupils are equal, round, and reactive to light.  Cardiovascular:     Rate and Rhythm: Normal rate.     Comments: Elevated blood pressure. Pulmonary:     Effort: Pulmonary effort is normal.  Musculoskeletal:     Cervical back: Normal range of motion.     Comments: Decreased ROM   Skin:    General: Skin is warm and dry.  Neurological:     Mental Status: He is alert and oriented to person, place, and time.  Psychiatric:        Attention and Perception: Attention and perception normal. He does not perceive auditory or visual hallucinations.        Mood and Affect: Mood is depressed. Affect is flat.        Speech: Speech is delayed (Reporting delayed speech result of previous stroke).        Behavior: Behavior is slowed. Behavior is cooperative.        Thought Content: Thought content is not paranoid or delusional. Thought content includes suicidal ideation. Thought content does not include homicidal ideation. Thought content does not include suicidal plan.  Cognition and Memory: Cognition and memory normal.        Judgment: Judgment is impulsive.    Review of Systems  Constitutional: Negative.   HENT: Negative.   Eyes: Negative.   Respiratory: Negative.  Negative for shortness of breath.   Cardiovascular: Negative.  Negative for chest pain, palpitations, claudication and leg swelling.       History of hypertension off of medication for 3 days complaints of  headache.   Gastrointestinal: Negative.   Genitourinary: Negative.   Musculoskeletal: Negative.   Skin: Negative.   Neurological: Positive for headaches.       History or 3 prior strokes.  Last one a year ago.  Delayed speech related to previous stroke.    Complaints of headache states he has been off of all medications for last 3 days   Endo/Heme/Allergies: Negative.   Psychiatric/Behavioral: Positive for depression and suicidal ideas. Negative for hallucinations. The patient is not nervous/anxious and does not have insomnia.    Blood pressure (!) 159/107, pulse 77, temperature 98 F (36.7 C), temperature source Tympanic, resp. rate 16, SpO2 98 %. There is no height or weight on file to calculate BMI.  Musculoskeletal: Strength & Muscle Tone: within normal limits Gait & Station: shuffle Patient leans: N/A   [facility] MSE Discharge Disposition for Follow up and Recommendations: Based on my evaluation the patient appears to have an emergency medical condition for which I recommend the patient be transferred to the emergency department for further evaluation.     Spoke to Dr. Ayesha Rumpf informed of findings and that patient being sent to ED for medical clearance and the can order TTS consult.     Kartik Fernando, NP 04/15/2020, 7:32 PM

## 2020-04-15 NOTE — ED Provider Notes (Signed)
[facility] EMERGENCY DEPARTMENT Provider Note   CSN: [id number] Arrival date & time: 04/15/20  1946     History Chief Complaint  Patient presents with  . Psychiatric Evaluation    [name] is a 70 y.o. male.  70 year old male with history of hypertension, hyperlipidemia, COPD, CVA, chronic atrial fibrillation, PAD, GERD presents to the emergency department in transfer from [facility].  States that he does not want to be around anymore.  Does state that he tried to attempt suicide 1 year ago by running into traffic.  He has not had any suicide attempt recently.  Does not endorse any suicidal plan.  Complained of a headache at urgent care, but denies this currently.  He has been noncompliant with his daily medications because he is unaware of what he takes.  He usually has someone to help him with this.  The history is provided by the patient. No language interpreter was used.       Past Medical History:  Diagnosis Date  . AAA (abdominal aortic aneurysm) ([place])    per last CT 03-26-2016---  3.9cm  . Anticoagulated on Coumadin   . Arthritis of spine   . Benign localized prostatic hyperplasia with lower urinary tract symptoms (LUTS)   . Chronic atrial fibrillation ([place]) cardiolgoist -- dr Stanford Breed   dx 2009  . Complication of anesthesia    per pt today (07-08-2016) had problem after having anesthesia 12/ 2016 surgery at [facility] "not good" went to ED ( in epic ED visit 01/ 2017 dx severe episode of major depression disorder and admitted to [facility]  . COPD with emphysema ([place])   . Dyspnea on exertion   . Full dentures   . GERD (gastroesophageal reflux disease)   . Hematuria   . History of amaurosis fugax    right eye 02-08-2015 -- resolved /  documented in epic possible left eye amaurosis fugax 11/ 2008 (pt denies) / per last MRI show previous bilateral occipital lobe infarcts  . History of concussion    as child  . History of CVA (cerebrovascular accident) 10/ 2016   and per pt Dec 2017 in [place]   neurologist-  dr Leonie Man-- per note silent infarcts on MRI-- multiple acute infarcts bilateral cerebullm, right temporal lobe, and bilateral occipital lobes due to embolic phenomena (atrial fib.)  . History of seizures as a child   . Hyperlipidemia   . Hypertension   . Left arm numbness    occasional numbess post residual axilla arterial occlusion and surgery  . Major depressive disorder    hx severe episode without psychosis 02/2015 w/ homicidal ideation  . Narcotic dependence ([place])   . PAD (peripheral artery disease) ([place])    followed by dr fields-- s/p  left axilla to branchial and left axilla to ulnar bypass graft due to ischemic hand  . Pulmonary nodule    per CT 03-26-2016  right lower lobe  . PVD (peripheral vascular disease) ([place])   . Right ureteral stone   . Senile purpura ([place])   . Weakness of left arm    residual from axilla arterial occlusion and post surgery  . Wears glasses     Patient Active Problem List   Diagnosis Date Noted  . Dementia without behavioral disturbance ([place])   . Toe ulcer ([place]) 04/05/2019  . Fever   . Tachycardia   . Tachypnea   . B12 deficiency 02/09/2019  . Hx of embolic stroke 60/73/7106  . Dysphagia due to  recent cerebrovascular accident 02/09/2019  . VAP (ventilator-associated pneumonia) ([place]) 02/09/2019  . Thrombocytosis 02/09/2019  . Fe deficiency anemia 02/09/2019  . Pressure injury of skin 02/06/2019  . Protein-calorie malnutrition, severe 01/30/2019  . Acute on chronic respiratory failure ([place]) 01/28/2019  . Aspiration into airway   . Endotracheal tube present   . Hypertensive emergency   . Stroke (cerebrum) ([place]) 01/18/2019  . Hydronephrosis 07/17/2016  . Kidney stone 07/17/2016  . Acute kidney injury ([place]) 07/17/2016  . Elevated troponin 07/17/2016  . Adjustment disorder with disturbance of emotion 07/14/2016  . Hyperlipemia 07/07/2016  . Arterial occlusion due to stenosis ([place]) 08/21/2015   . Aftercare following surgery of the circulatory system 08/21/2015  . Current smoker 08/21/2015  . Abdominal aortic aneurysm (AAA) ([place]) 05/02/2015  . MDD (major depressive disorder), single episode, severe , no psychosis ([place]) 02/10/2015  . Major depressive disorder, recurrent, severe without psychotic features ([place]) 02/09/2015  . Limb ischemia 01/23/2015  . Numbness 01/20/2015  . PAD (peripheral artery disease) ([place]) 12/10/2014  . Embolic stroke ([place]) 90/24/0973  . Ischemia of extremity 11/20/2014  . Ischemia of hand 04/05/2014  . Special screening for malignant neoplasms, colon 03/20/2014  . Warfarin-induced coagulopathy ([place]) 03/20/2014  . Encounter for therapeutic drug monitoring 03/08/2013  . Headache 01/29/2013  . Prostate nodule with urinary obstruction 12/13/2012  . Routine general medical examination at a health care facility 12/13/2012  . Depression with anxiety 12/07/2012  . Long term (current) use of anticoagulants 04/30/2010  . HYPERCHOLESTEROLEMIA-PURE 04/22/2008  . TOBACCO ABUSE 04/22/2008  . HYPERTENSION, BENIGN ESSENTIAL 04/22/2008  . Atrial fibrillation with RVR ([place]) 04/22/2008  . COPD (chronic obstructive pulmonary disease) ([place]) 04/22/2008    Past Surgical History:  Procedure Laterality Date  . ARCH AORTOGRAM N/A 04/05/2014   Procedure: ARCH AORTOGRAM, FIRST ORDER CATHETERIZATION LEFT SUBCLAVIAN ARTERY;  Surgeon: Elam Dutch, MD;  Location: [facility] OR;  Service: Vascular;  Laterality: N/A;  . AXILLARY-FEMORAL BYPASS GRAFT Left 04/08/2014   Procedure: LEFT AXILLARY ARTERY TO BRACHIAL ARTERY BYPASS USING NON REVERSE LEFT GREATER SAPHENOUS VEIN ,LIGATION OF LEFT AXILLARY ARTERY ANEURYSM;  Surgeon: Elam Dutch, MD;  Location: [facility];  Service: Vascular;  Laterality: Left;  . BYPASS AXILLA/BRACHIAL ARTERY Left 01/27/2015   Procedure: LEFT BRACHIAL-ULAR ARTERY BYPASS USING GREATER SAPHENOUS VEIN;  Surgeon: Elam Dutch, MD;  Location: [facility];  Service:  Vascular;  Laterality: Left;  . CYSTOSCOPY WITH RETROGRADE PYELOGRAM, URETEROSCOPY AND STENT PLACEMENT Right 07/17/2016   Procedure: CYSTOSCOPY WITH RETROGRADE PYELOGRAM, URETEROSCOPY AND STENT PLACEMENT,LASER;  Surgeon: Festus Aloe, MD;  Location: [facility];  Service: Urology;  Laterality: Right;  . CYSTOSCOPY/URETEROSCOPY/HOLMIUM LASER/STENT PLACEMENT Right 07/13/2016   Procedure: CYSTOSCOPY, RIGHT URETEROSCOPY RETROGRADE PYELOGRAM;  Surgeon: Kathie Rhodes, MD;  Location: [facility];  Service: Urology;  Laterality: Right;  . EMBOLECTOMY Left 04/05/2014   Procedure: THROMBO ENDARTERECTOMY OF LEFT BRACHIAL, RADIAL AND ULNAR ARTERY,  Left Radial artery cut down and radial artery thrombectomy.;  Surgeon: Elam Dutch, MD;  Location: [facility] OR;  Service: Vascular;  Laterality: Left;  . ESOPHAGOGASTRODUODENOSCOPY N/A 01/30/2019   Procedure: ESOPHAGOGASTRODUODENOSCOPY (EGD);  Surgeon: Jesusita Oka, MD;  Location: [facility];  Service: General;  Laterality: N/A;  . facial cyst Right    cyst removal.   . IR REPLC GASTRO/COLONIC TUBE PERCUT W/FLUORO  02/17/2019  . PATCH ANGIOPLASTY Left 04/05/2014   Procedure: LEFT ARM VEIN PATCH ANGIOPLASTY;  Surgeon: Elam Dutch, MD;  Location: [facility];  Service: Vascular;  Laterality: Left;  . PEG PLACEMENT N/A 01/30/2019   Procedure: PERCUTANEOUS ENDOSCOPIC GASTROSTOMY (PEG) PLACEMENT;  Surgeon: Jesusita Oka, MD;  Location: [facility];  Service: General;  Laterality: N/A;  . PERIPHERAL VASCULAR CATHETERIZATION N/A 01/22/2015   Procedure: Aortic Arch Angiography;  Surgeon: Elam Dutch, MD;  Location: [facility] CV LAB;  Service: Cardiovascular;  Laterality: N/A;  . THROMBECTOMY BRACHIAL ARTERY Left 11/20/2014   Procedure: 1.  Thrombectomy Left Axilo-Brachial Bypass  2.  Thromboendarterecotmy of Left Brachial Artery with Fogarty Thrombectomy of Radial and Ulnar Arteries with Dacron patch angioplasty Left Brachial Artery. 3. Intraoperative   Arteriogram times four.;  Surgeon: Mal Misty, MD;  Location: [facility];  Service: Vascular;  Laterality: Left;  . TRANSTHORACIC ECHOCARDIOGRAM  11/25/2014   moderate concentric LVH, ef 60-65%, unable to evaluation LVDF due to atrial fib/  mild MR/  severe LAE  . VEIN HARVEST Left 04/08/2014   Procedure: LEFT GREATER SAPHENOUS VEIN HARVEST;  Surgeon: Elam Dutch, MD;  Location: [facility];  Service: Vascular;  Laterality: Left;  Marland Kitchen VEIN HARVEST Right 01/27/2015   Procedure: RIGHT GREATER SAPHENOUS VEIN HARVEST;  Surgeon: Elam Dutch, MD;  Location: [facility];  Service: Vascular;  Laterality: Right;       Family History  Problem Relation Age of Onset  . Hypertension Mother   . Lung cancer Mother   . Stomach cancer Father        died age 70  . Cancer Sister   . Early death Neg Hx   . Heart disease Neg Hx   . Hyperlipidemia Neg Hx   . Kidney disease Neg Hx   . Stroke Neg Hx     Social History   Tobacco Use  . Smoking status: Current Every Day Smoker    Packs/day: 2.00    Years: 51.00    Pack years: 102.00    Types: Cigarettes  . Smokeless tobacco: Never Used  . Tobacco comment: per pt 07-08-2016 down to 2ppd from 4 ppd (quit one time for 4 years since started at age 31)  63 Use  . Vaping Use: Never used  Substance Use Topics  . Alcohol use: Yes    Alcohol/week: 0.0 standard drinks    Comment: occasional   . Drug use: No    Comment: per pt 07-08-2016 hx drug use stopped 1970's    Home Medications Prior to Admission medications   Medication Sig Start Date End Date Taking? Authorizing Provider  acetaminophen (TYLENOL) 500 MG tablet Take 500 mg by mouth every 6 (six) hours as needed for mild pain.     [provider]  alum & mag hydroxide-simeth (MAALOX/MYLANTA) 200-200-20 MG/5ML suspension Take 30 mLs by mouth every 6 (six) hours as needed for indigestion or heartburn. 03/03/19   Arlan Organ, DO  Amino Acids-Protein Hydrolys (FEEDING SUPPLEMENT, PRO-STAT  SUGAR FREE 64,) LIQD Place 30 mLs into feeding tube 2 (two) times daily. 03/03/19   Arlan Organ, DO  amiodarone (PACERONE) 200 MG tablet Place 1 tablet (200 mg total) into feeding tube daily. 03/04/19   Arlan Organ, DO  apixaban (ELIQUIS) 5 MG TABS tablet Place 1 tablet (5 mg total) into feeding tube 2 (two) times daily. 03/03/19   Arlan Organ, DO  atorvastatin (LIPITOR) 80 MG tablet Take 1 tablet (80 mg total) by mouth daily. 12/17/19 03/16/20  Freada Bergeron, MD  diltiazem (CARDIZEM CD) 120 MG 24 hr capsule Take 1 capsule (120 mg total)  by mouth daily. 03/04/19   Arlan Organ, DO  divalproex (DEPAKOTE SPRINKLE) 125 MG capsule Take 4 capsules (500 mg total) by mouth every 12 (twelve) hours. 03/03/19   Arlan Organ, DO  folic acid (FOLVITE) 1 MG tablet Take 2 tablets (2 mg total) by mouth daily. 03/04/19   Arlan Organ, DO  metoprolol tartrate (LOPRESSOR) 50 MG tablet Take 1 tablet (50 mg total) by mouth 2 (two) times daily. 12/17/19   Freada Bergeron, MD  mirtazapine (REMERON) 15 MG tablet Take 1 tablet by mouth at bedtime.  09/03/19   [provider]  mouth rinse LIQD solution 15 mLs by Mouth Rinse route 2 (two) times daily. 03/03/19   Arlan Organ, DO  naproxen (NAPROSYN) 250 MG tablet Take by mouth 2 (two) times daily with a meal. X 10 days     [provider]  QUEtiapine (SEROQUEL) 50 MG tablet Take 3 tablets (150 mg total) by mouth at bedtime. Patient taking differently: Take 150 mg by mouth 3 (three) times daily. 03/03/19   Arlan Organ, DO  traMADol (ULTRAM) 50 MG tablet Take 50-100 mg by mouth every 6 (six) hours as needed for moderate pain.  12/16/15   [provider]  vitamin B-12 1000 MCG tablet Place 1 tablet (1,000 mcg total) into feeding tube daily. 03/04/19   Arlan Organ, DO    Allergies    Oxycodone  Review of Systems   Review of Systems  Ten systems reviewed and are negative for acute change, except as noted in the HPI.     Physical Exam Updated Vital Signs BP (!) 149/98   Pulse 66   Temp 97.7 F (36.5 C) (Oral)   Resp 16   Ht 5\' 10"  (1.778 m)   Wt 69.4 kg   SpO2 99%   BMI 21.95 kg/m   Physical Exam Vitals and nursing note reviewed.  Constitutional:      General: He is not in acute distress.    Appearance: He is well-developed and well-nourished. He is not diaphoretic.     Comments: Nontoxic appearing  HENT:     Head: Normocephalic and atraumatic.  Eyes:     General: No scleral icterus.    Extraocular Movements: EOM normal.     Conjunctiva/sclera: Conjunctivae normal.  Pulmonary:     Effort: Pulmonary effort is normal. No respiratory distress.     Comments: Respirations even and unlabored Musculoskeletal:        General: Normal range of motion.     Cervical back: Normal range of motion.  Skin:    General: Skin is warm and dry.     Coloration: Skin is not pale.     Findings: No erythema or rash.  Neurological:     Mental Status: He is alert and oriented to person, place, and time.     Comments: Ambulatory with 1 person assist; gait steady.  Psychiatric:        Mood and Affect: Mood is depressed.        Speech: Speech normal.        Behavior: Behavior is cooperative.        Thought Content: Thought content includes suicidal ideation. Thought content does not include suicidal plan.     ED Results / Procedures / Treatments   Labs (all labs ordered are listed, but only abnormal results are displayed) Labs Reviewed  COMPREHENSIVE METABOLIC PANEL - Abnormal; Notable for the following components:  Result Value   Glucose, Bld 111 (*)    ALT 49 (*)    All other components within normal limits  SALICYLATE LEVEL - Abnormal; Notable for the following components:   Salicylate Lvl <1.9 (*)    All other components within normal limits  ACETAMINOPHEN LEVEL - Abnormal; Notable for the following components:   Acetaminophen (Tylenol), Serum <10 (*)    All other components within normal  limits  RAPID URINE DRUG SCREEN, HOSP PERFORMED - Abnormal; Notable for the following components:   Opiates POSITIVE (*)    All other components within normal limits  RESP PANEL BY RT-PCR (FLU A&B, COVID) ARPGX2  ETHANOL  CBC  PROTIME-INR    EKG None  Radiology No results found.  Procedures Procedures   Medications Ordered in ED Medications  acetaminophen (TYLENOL) tablet 500 mg (has no administration in time range)  diltiazem (CARDIZEM CD) 24 hr capsule 120 mg (has no administration in time range)  metoprolol tartrate (LOPRESSOR) tablet 50 mg (50 mg Oral Given 04/15/20 2355)  mirtazapine (REMERON) tablet 15 mg (15 mg Oral Given 04/15/20 2355)  atorvastatin (LIPITOR) tablet 40 mg (has no administration in time range)  amiodarone (PACERONE) tablet 200 mg (has no administration in time range)    ED Course  I have reviewed the triage vital signs and the nursing notes.  Pertinent labs & imaging results that were available during my care of the patient were reviewed by me and considered in my medical decision making (see chart for details).  Clinical Course as of 04/16/20 0628  Wed Apr 16, 2020  0301 Patient assessed by TTS. Question competency.   Per TTS - DSS involved and looks like they are looking to place him into a facility. It looks like an FL-2 form was completed on him in the system. He will need to have a social work consult to follow-up with his guardian to see if any progress has been made with this. He does not appear to be in need of a psychiatric admission.  He is denying that he wants to hurt himself or others. [ER]  [id number] Consult placed to transitions of care team for AM evaluation. [KH]    Clinical Course User Index [KH] Beverely Pace   MDM Rules/Calculators/A&P                          70 year old male presents to the emergency department for psychiatric evaluation.  He has been medically cleared and evaluated by TTS who do not feel he requires inpatient  psychiatric care at this time.  It does seem that the patient has multiple notes in reference to placement to a long-term care facility.  Multiple services involved including APS, DSS, social work.  Will have transition of care team weigh in this morning to see if any of this can be expedited.  Patient is content to return to his hotel if no additional accommodations able to be made.  Would require safe transport back to where he resides via taxi voucher.  Diet and daily medications ordered.  [place], PA-C to follow.   Final Clinical Impression(s) / ED Diagnoses Final diagnoses:  Behavior disturbance    Rx / DC Orders ED Discharge Orders    None       Antonietta Breach, PA-C 04/16/20 1448    Valarie Merino, MD 04/18/20 1020

## 2020-04-15 NOTE — ED Triage Notes (Signed)
Patient BIB [facility] from Extended Stay, patient was initially brought to [facility] but due to age and headache complaint they sent him here. Patient states, "I don't want to live anymore".

## 2020-04-15 NOTE — Telephone Encounter (Signed)
Notified [name] and left a voicemail letting her know they have been faxed over.

## 2020-04-16 DIAGNOSIS — F0390 Unspecified dementia without behavioral disturbance: Secondary | ICD-10-CM | POA: Insufficient documentation

## 2020-04-16 DIAGNOSIS — F322 Major depressive disorder, single episode, severe without psychotic features: Secondary | ICD-10-CM

## 2020-04-16 LAB — PROTIME-INR
INR: 1.2 (ref 0.8–1.2)
Prothrombin Time: 14.3 seconds (ref 11.4–15.2)

## 2020-04-16 MED ORDER — LOSARTAN POTASSIUM 50 MG PO TABS
50.0000 mg | ORAL_TABLET | Freq: Every morning | ORAL | Status: DC
  Administered 2020-04-16: 50 mg via ORAL
  Filled 2020-04-16: qty 1

## 2020-04-16 MED ORDER — APIXABAN 5 MG PO TABS
5.0000 mg | ORAL_TABLET | Freq: Two times a day (BID) | ORAL | Status: DC
  Administered 2020-04-16: 5 mg
  Filled 2020-04-16: qty 1
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
## 2020-04-16 NOTE — ED Provider Notes (Signed)
  Physical Exam  BP (!) 175/120 (BP Location: Right Arm)   Pulse 67   Temp 97.8 F (36.6 C) (Oral)   Resp 18   Ht 5\' 10"  (1.778 m)   Wt 69.4 kg   SpO2 98%   BMI 21.95 kg/m   Physical Exam  ED Course/Procedures   Clinical Course as of 04/16/20 1509  Wed Apr 16, 2020  0301 Patient assessed by TTS. Question competency.   Per TTS - DSS involved and looks like they are looking to place him into a facility. It looks like an FL-2 form was completed on him in the system. He will need to have a social work consult to follow-up with his guardian to see if any progress has been made with this. He does not appear to be in need of a psychiatric admission.  He is denying that he wants to hurt himself or others. [GD]  9242 Consult placed to transitions of care team for AM evaluation. [KH]    Clinical Course User Index [KH] Antonietta Breach, PA-C    Procedures  MDM  Patient care assumed from [facility] PA at shift change, see her note for full HPI.  Briefly, patient here is for a form be called.  He has been cleared by psychiatry.  He is pending TOC follow-up in order to have been placed.  According to prior provider patient has had a FL 2 form filled.  I was contacted by pharmacy in order to restart patient's Eliquis along with blood pressure medication.  He did receive filled yesterday.  Blood pressures are still elevated this morning with a systolic of 683/419, his losartan was resumed at this time along with his Eliquis.  Patient is currently pending TOC.  11:52 AM patient was ambulated by physical therapy. Per PT does not qualified for SNF placement.  We have been working with social work in order to obtain a safe disposition for patient.  Patient was evaluated by PT and he voiced to them that he hoped he had Covid.  He was psychiatrically clear, denying SI and HI at this time without a plan aside from getting Covid during the middle of a pandemic.  1:11 PM patient does currently reside at a  motel between [address] and self then, he does not know the exact address but states that he is able to get there via taxi.  I will obtain a taxi voucher for patient from charge nurse at this time.  Patient is eating adequately, vitals have remained stable and his medication has been given here.  Patient is stable for discharge at this time.  1:53 PM multiple calls to [facility] in order to obtain further disposition for patient as behavioral health notes indicated that patient was to be watched overnight. According to staff patient is to be evaluated by [facility] Provider who has attempted to connect with patient, will provide him with TTS machine at this time for consult.   2:47 PM Patient having TTS assessment at this time.  Patient care signed out to incoming provider.    Portions of this note were generated with Lobbyist. Dictation errors may occur despite best attempts at proofreading.        Janeece Fitting, PA-C 04/16/20 1509    Lucrezia Starch, MD 04/18/20 [phone]

## 2020-04-16 NOTE — Progress Notes (Signed)
CSW contacted DSS to find out the situation with patient. Patient has been psych and medically cleared. CSW was told Ms. [name] was in a meeting but an email will be sent to her. CSW left a message on her voicemail. [phone].

## 2020-04-16 NOTE — ED Provider Notes (Signed)
Care assumed from previous provider normal.  See note for full HPI.    In summation patient presented in transfer from [facility] for " not wanting to be here anymore."  Patient was transferred for medical clearance and then repeat TTS.  Currently was initially seen by TTS.  They did not feel he required inpatient psychiatric care.  He was medically cleared at that time.  Apparently patient is involved with DSS as there is legal guardian.  There is plan for transition of care team to see if patient could possibly be eligible for SNF facility  PT had evaluated patient.  He is ambulatory.  Did not qualify for SNF.  Apparently patient had mentioned SI to one of the social workers.  He was therefore reconsulted with psychiatry.  Plan for psychiatry reevaluation.   Re TTS, dc back to home Physical Exam  BP (!) 175/120 (BP Location: Right Arm)   Pulse 67   Temp 97.8 F (36.6 C) (Oral)   Resp 18   Ht 5\' 10"  (1.778 m)   Wt 69.4 kg   SpO2 98%   BMI 21.95 kg/m   Physical Exam Vitals and nursing note reviewed.  Constitutional:      General: He is not in acute distress.    Appearance: He is well-developed and well-nourished. He is not toxic-appearing or diaphoretic.  HENT:     Head: Atraumatic.  Eyes:     Pupils: Pupils are equal, round, and reactive to light.  Cardiovascular:     Rate and Rhythm: Normal rate and regular rhythm.  Pulmonary:     Effort: Pulmonary effort is normal. No respiratory distress.  Abdominal:     General: There is no distension.     Palpations: Abdomen is soft.  Musculoskeletal:        General: Normal range of motion.     Cervical back: Normal range of motion and neck supple.  Skin:    General: Skin is warm and dry.     Capillary Refill: Capillary refill takes less than 2 seconds.  Neurological:     General: No focal deficit present.     Mental Status: He is alert.  Psychiatric:        Mood and Affect: Mood and affect normal.     ED Course/Procedures    Clinical Course as of 04/16/20 2029  Wed Apr 16, 2020  0301 Patient assessed by TTS. Question competency.   Per TTS - DSS involved and looks like they are looking to place him into a facility. It looks like an FL-2 form was completed on him in the system. He will need to have a social work consult to follow-up with his guardian to see if any progress has been made with this. He does not appear to be in need of a psychiatric admission.  He is denying that he wants to hurt himself or others. [XB]  2841 Consult placed to transitions of care team for AM evaluation. [KH]    Clinical Course User Index [KH] Antonietta Breach, PA-C    Procedures Labs Reviewed  COMPREHENSIVE METABOLIC PANEL - Abnormal; Notable for the following components:      Result Value   Glucose, Bld 111 (*)    ALT 49 (*)    All other components within normal limits  SALICYLATE LEVEL - Abnormal; Notable for the following components:   Salicylate Lvl <3.2 (*)    All other components within normal limits  ACETAMINOPHEN LEVEL - Abnormal; Notable for the following  components:   Acetaminophen (Tylenol), Serum <10 (*)    All other components within normal limits  RAPID URINE DRUG SCREEN, HOSP PERFORMED - Abnormal; Notable for the following components:   Opiates POSITIVE (*)    All other components within normal limits  RESP PANEL BY RT-PCR (FLU A&B, COVID) ARPGX2  ETHANOL  CBC  PROTIME-INR  No results found. MDM  Patient was revived by psychiatry.  Did not meet inpatient criteria.  He has been medically cleared.  Per social work DSS is aware of patient being discharged.  Denies any SI, HI, AV with providers  Patient denies any complaints.  He is ambulatory without difficulty.  He is tolerating p.o. intake.  Patient has ride back to living facility.  The patient has been appropriately medically screened and/or stabilized in the ED. I have low suspicion for any other emergent medical condition which would require further  screening, evaluation or treatment in the ED or require inpatient management.  Patient is hemodynamically stable and in no acute distress.  Patient able to ambulate in department prior to ED.  Evaluation does not show acute pathology that would require ongoing or additional emergent interventions while in the emergency department or further inpatient treatment.  I have discussed the diagnosis with the patient and answered all questions.  Pain is been managed while in the emergency department and patient has no further complaints prior to discharge.  Patient is comfortable with plan discussed in room and is stable for discharge at this time.  I have discussed strict return precautions for returning to the emergency department.  Patient was encouraged to follow-up with PCP/specialist refer to at discharge.      Henderly, Britni A, PA-C 04/16/20 2030    Quintella Reichert, MD 04/17/20 Benancio Deeds

## 2020-04-16 NOTE — ED Notes (Signed)
DC instructions reviewed with pt in front of officer who is transporting.  Pt vebalized understanding.  PT DC to hotel via [employer].

## 2020-04-16 NOTE — Progress Notes (Addendum)
Pt seen and evaluated in ED. Overall at a supervision level for long distance gait this session. Pt with pain in LLE throughout, but reports this is baseline. Pt A and O X3, but could not recall time. Pt currently staying in motel. Do not feel he will qualify for SNF stay at this time as pt not requiring physical assist for mobility tasks and likely close to his baseline. Would benefit from HHPT follow up. Formal note to follow.   Of note:  In conversation pt reports he tried to get COVID on purpose. When PT questioned why, pt reports "so it could take me out". Notified CSW and Therapist, sports. May benefit from further psych assessment.   Reuel Derby, PT, DPT  Acute Rehabilitation Services  Pager: [phone] Office: [phone]

## 2020-04-16 NOTE — Progress Notes (Signed)
CSW sent AVS to DSS case worker [name] Turner via secure email.

## 2020-04-16 NOTE — ED Notes (Signed)
Pt getting agitated r/t wanting to smoke. Pt counseled that the are unable to go outside at this time. RN offered to discuss with MD about nicotine patch. Pt refused.

## 2020-04-16 NOTE — Progress Notes (Signed)
CSW informed PA of patient not being approved for SNF. CSW also updated PA about patient stating to PT of hopes of getting covid because it would take him out. CSW is waiting for a call back from APS.

## 2020-04-16 NOTE — Progress Notes (Signed)
[employer] contacted to take patient back to [facility] on [address]. Patient is in room 166.

## 2020-04-16 NOTE — Progress Notes (Signed)
CSW contacted APS main number asking if a message could be left for [name] to call CSW due to patient not qualifying for SNF. CSW needs motel address and room number.

## 2020-04-16 NOTE — Evaluation (Addendum)
Physical Therapy Evaluation and Discharge Patient Details Name: [name] MRN: [id number] DOB: Dec 20, 1950 Today's Date: 04/16/2020   History of Present Illness  Pt is a 70 y/o male presenting to the ED on 3/8 secondary to suicidal thoughts. PMH includes hypertension, hyperlipidemia, COPD, CVA, chronic atrial fibrillation, PAD, GERD.  Clinical Impression  Pt admitted secondary to problem above with deficits below. Pt overall at a supervision level for mobility tasks. LLE pain at baseline and pt with slightly antalgic gait. Pt currently at a motel and lives alone. States he wants to go home ASAP because he's go "some suing business" to take care of. In conversation pt reports he tried to get COVID on purpose. When PT questioned why, pt reports "so it could take me out". Notified CSW and Therapist, sports. May benefit from further psych assessment. Pt currently at baseline, but feel he would benefit from HHPT for safety eval for home. No further acute PT needs. Will sign off. If needs change, please re-consult.    Follow Up Recommendations Home health PT (for safety eval)    Equipment Recommendations  None recommended by PT    Recommendations for Other Services       Precautions / Restrictions Precautions Precautions: Fall Restrictions Weight Bearing Restrictions: No      Mobility  Bed Mobility               General bed mobility comments: Sitting at EOB    Transfers Overall transfer level: Needs assistance Equipment used: None Transfers: Sit to/from Stand Sit to Stand: Supervision         General transfer comment: supervision for safety.  Ambulation/Gait Ambulation/Gait assistance: Supervision Gait Distance (Feet): 150 Feet Assistive device: None Gait Pattern/deviations: Step-through pattern;Decreased stride length;Decreased weight shift to left Gait velocity: Decreased   General Gait Details: Pt reports LLE pain, but reports this is baseline. Supervision for safety. No  overt LOB noted. Educated about use of cane to help with pain management.  Stairs            Wheelchair Mobility    Modified Rankin (Stroke Patients Only)       Balance Overall balance assessment: Needs assistance Sitting-balance support: No upper extremity supported;Feet supported Sitting balance-Leahy Scale: Good     Standing balance support: No upper extremity supported;During functional activity Standing balance-Leahy Scale: Fair                               Pertinent Vitals/Pain Pain Assessment: Faces Faces Pain Scale: Hurts a little bit Pain Location: L heel Pain Descriptors / Indicators: Grimacing;Guarding Pain Intervention(s): Limited activity within patient's tolerance;Monitored during session;Repositioned    Home Living Family/patient expects to be discharged to:: Other (Comment) (motel) Living Arrangements: Alone Available Help at Discharge: Friend(s);Available PRN/intermittently Type of Home: Other(Comment) (motel) Home Access: Level entry     Home Layout: One level Home Equipment: Cane - single point      Prior Function Level of Independence: Independent with assistive device(s)         Comments: Pt reports occasional use of cane.     Hand Dominance        Extremity/Trunk Assessment   Upper Extremity Assessment Upper Extremity Assessment: Generalized weakness    Lower Extremity Assessment Lower Extremity Assessment: LLE deficits/detail LLE Deficits / Details: LLE weakness at baseline    Cervical / Trunk Assessment Cervical / Trunk Assessment: Kyphotic  Communication   Communication: No difficulties  Cognition Arousal/Alertness: Awake/alert Behavior During Therapy: WFL for tasks assessed/performed Overall Cognitive Status: No family/caregiver present to determine baseline cognitive functioning                                 General Comments: Pt with slow processing. Oriented X3. Disoriented to time.  Psych history noted, and pt stating he tried to get covid, so it would take him out of here. Notified social work and Therapist, sports.      General Comments      Exercises     Assessment/Plan    PT Assessment Patent does not need any further PT services  PT Problem List         PT Treatment Interventions      PT Goals (Current goals can be found in the Care Plan section)  Acute Rehab PT Goals Patient Stated Goal: to go home ASAP PT Goal Formulation: With patient Time For Goal Achievement: 04/30/20 Potential to Achieve Goals: Fair    Frequency     Barriers to discharge        Co-evaluation               AM-PAC PT "6 Clicks" Mobility  Outcome Measure Help needed turning from your back to your side while in a flat bed without using bedrails?: None Help needed moving from lying on your back to sitting on the side of a flat bed without using bedrails?: None Help needed moving to and from a bed to a chair (including a wheelchair)?: None Help needed standing up from a chair using your arms (e.g., wheelchair or bedside chair)?: None Help needed to walk in hospital room?: None Help needed climbing 3-5 steps with a railing? : A Little 6 Click Score: 23    End of Session Equipment Utilized During Treatment: Gait belt Activity Tolerance: Patient tolerated treatment well Patient left: in bed;with call bell/phone within reach;with nursing/sitter in room (sitting EOB on side of stretcher) Nurse Communication: Mobility status PT Visit Diagnosis: Other abnormalities of gait and mobility (R26.89);Muscle weakness (generalized) (M62.81)    Time: 1130-1140 PT Time Calculation (min) (ACUTE ONLY): 10 min   Charges:   PT Evaluation $PT Eval Moderate Complexity: 1 Mod          Reuel Derby, PT, DPT  Acute Rehabilitation Services  Pager: [phone] Office: [phone]   Rudean Hitt 04/16/2020, 1:39 PM

## 2020-04-16 NOTE — Progress Notes (Signed)
CSW left a message with Ms. Turner from [facility] requesting a call back

## 2020-04-16 NOTE — ED Notes (Signed)
Pt given a sandwich bag and a drink.

## 2020-04-16 NOTE — Progress Notes (Signed)
Patient is psych cleared for discharge.
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
## 2020-04-16 NOTE — Progress Notes (Signed)
CSW received a call back from Ms. Turner who stated that she has been trying to get patient into long-term care and has been unsuccessful. Ms. Radford Pax asked CSW if the hospital could help on their end. CSW stated that she could not make any promises he patient would qualify for SNF. CSW informed Ms. Turner that when patient is medically and psych cleared then he would need to be discharged.

## 2020-04-17 ENCOUNTER — Telehealth: Payer: Self-pay

## 2020-04-17 NOTE — Telephone Encounter (Signed)
Transition Care Management Unsuccessful Follow-up Telephone Call  Date of discharge and from where:  04/16/2020 [facility]  Attempts:  1st Attempt  Reason for unsuccessful TCM follow-up call:  Left voice message   Patient has a legal guardian.

## 2020-04-18 NOTE — Telephone Encounter (Signed)
Transition Care Management Unsuccessful Follow-up Telephone Call  Date of discharge and from where:  04/16/2020 from [facility]  Attempts:  2nd Attempt  Reason for unsuccessful TCM follow-up call:  Unable to reach patient

## 2020-04-21 NOTE — Telephone Encounter (Signed)
Transition Care Management Unsuccessful Follow-up Telephone Call  Date of discharge and from where:  04/16/2020 from [facility]  Attempts:  2nd Attempt  Reason for unsuccessful TCM follow-up call:  Unable to reach patient

## 2020-04-22 ENCOUNTER — Telehealth: Payer: Self-pay | Admitting: Physician Assistant

## 2020-04-22 NOTE — Telephone Encounter (Signed)
[name] called with [facility] stating the hospital said he does not qualify for skilled nursing as he can walk and bathe himself. They state he would be better for assisted living. Patient has had strokes, and cognitive issues, and has trouble opening things so he does need skilled nursing. Please advise, thanks.

## 2020-04-22 NOTE — Telephone Encounter (Signed)
Recommend inquiring from [place] what are the additional steps for the process of getting assisted living. 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  Thank you, Herb Grays

## 2020-07-01 IMAGING — DX DG CHEST 1V PORT
1 series · 1 of 1 positions shown · non-contrast
Comparison: 02/09/2019

CLINICAL DATA: 68-year-old male with a history of fever

EXAM:
PORTABLE CHEST 1 VIEW

[chest ap]
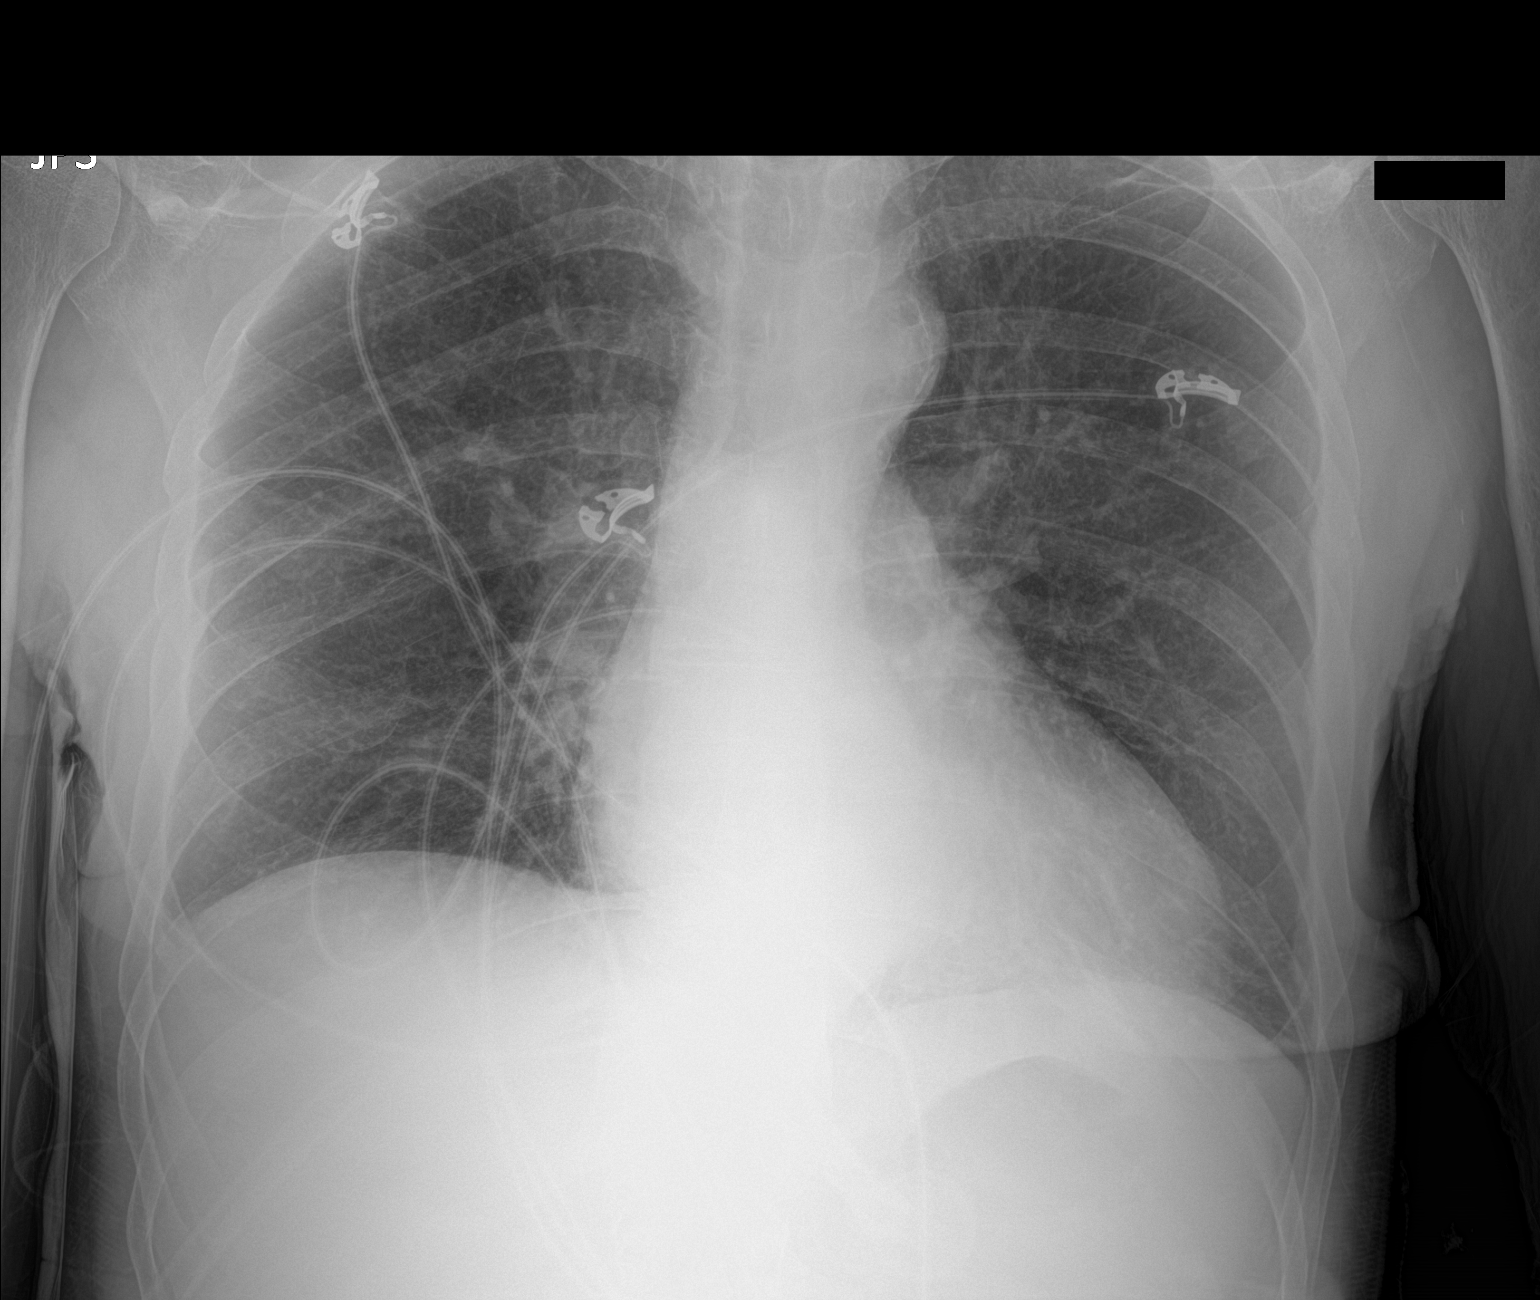

[1 of 1 positions shown; findings below may reference images not displayed]

FINDINGS: Cardiomediastinal silhouette unchanged in size and contour. No
pneumothorax. No pleural effusion.

Improved aeration compared to the prior plain film. No interlobular
septal thickening.

No displaced fracture
IMPRESSION: Improved aeration compared to the prior with no evidence of acute
cardiopulmonary disease
